# Patient Record
Sex: Female | Born: 1950
Health system: Southern US, Community
[De-identification: ages and names within clinical notes are randomized; demographics above are authoritative.]

## PROBLEM LIST (undated history)

## (undated) DIAGNOSIS — D509 Iron deficiency anemia, unspecified: Secondary | ICD-10-CM

## (undated) DIAGNOSIS — Z87442 Personal history of urinary calculi: Secondary | ICD-10-CM

## (undated) DIAGNOSIS — Z8619 Personal history of other infectious and parasitic diseases: Secondary | ICD-10-CM

## (undated) DIAGNOSIS — G894 Chronic pain syndrome: Secondary | ICD-10-CM

## (undated) DIAGNOSIS — E782 Mixed hyperlipidemia: Secondary | ICD-10-CM

## (undated) DIAGNOSIS — N183 Chronic kidney disease, stage 3 unspecified: Secondary | ICD-10-CM

## (undated) DIAGNOSIS — G40909 Epilepsy, unspecified, not intractable, without status epilepticus: Secondary | ICD-10-CM

## (undated) DIAGNOSIS — I10 Essential (primary) hypertension: Secondary | ICD-10-CM

## (undated) DIAGNOSIS — Z973 Presence of spectacles and contact lenses: Secondary | ICD-10-CM

## (undated) DIAGNOSIS — Z87440 Personal history of urinary (tract) infections: Secondary | ICD-10-CM

## (undated) DIAGNOSIS — Z87448 Personal history of other diseases of urinary system: Secondary | ICD-10-CM

## (undated) DIAGNOSIS — G43709 Chronic migraine without aura, not intractable, without status migrainosus: Secondary | ICD-10-CM

## (undated) DIAGNOSIS — E039 Hypothyroidism, unspecified: Secondary | ICD-10-CM

## (undated) DIAGNOSIS — M1990 Unspecified osteoarthritis, unspecified site: Secondary | ICD-10-CM

## (undated) DIAGNOSIS — N200 Calculus of kidney: Secondary | ICD-10-CM

## (undated) DIAGNOSIS — IMO0002 Reserved for concepts with insufficient information to code with codable children: Secondary | ICD-10-CM

## (undated) DIAGNOSIS — E0590 Thyrotoxicosis, unspecified without thyrotoxic crisis or storm: Secondary | ICD-10-CM

## (undated) DIAGNOSIS — N261 Atrophy of kidney (terminal): Secondary | ICD-10-CM

## (undated) DIAGNOSIS — M542 Cervicalgia: Secondary | ICD-10-CM

## (undated) DIAGNOSIS — E119 Type 2 diabetes mellitus without complications: Secondary | ICD-10-CM

## (undated) DIAGNOSIS — M25511 Pain in right shoulder: Secondary | ICD-10-CM

## (undated) DIAGNOSIS — N201 Calculus of ureter: Secondary | ICD-10-CM

## (undated) HISTORY — DX: Essential (primary) hypertension: I10

## (undated) HISTORY — PX: EYE SURGERY: SHX253

## (undated) HISTORY — PX: SHOULDER ARTHROSCOPY: SHX128

## (undated) HISTORY — PX: TONSILLECTOMY: SUR1361

## (undated) HISTORY — PX: IR URETERAL STENT PLACEMENT EXISTING ACCESS RIGHT: IMG6074

## (undated) HISTORY — PX: OTHER SURGICAL HISTORY: SHX169

## (undated) HISTORY — DX: Iron deficiency anemia, unspecified: D50.9

---

## 2000-10-01 HISTORY — PX: CHOLECYSTECTOMY OPEN: SUR202

## 2000-11-13 ENCOUNTER — Ambulatory Visit (HOSPITAL_BASED_OUTPATIENT_CLINIC_OR_DEPARTMENT_OTHER): Admission: RE | Admit: 2000-11-13 | Discharge: 2000-11-13 | Payer: Self-pay | Admitting: Orthopedic Surgery

## 2001-02-06 ENCOUNTER — Encounter: Payer: Self-pay | Admitting: Urology

## 2001-02-06 ENCOUNTER — Ambulatory Visit (HOSPITAL_COMMUNITY): Admission: RE | Admit: 2001-02-06 | Discharge: 2001-02-06 | Payer: Self-pay | Admitting: Urology

## 2001-03-27 ENCOUNTER — Encounter: Payer: Self-pay | Admitting: Urology

## 2001-03-27 ENCOUNTER — Ambulatory Visit (HOSPITAL_COMMUNITY): Admission: RE | Admit: 2001-03-27 | Discharge: 2001-03-27 | Payer: Self-pay | Admitting: Urology

## 2003-07-27 ENCOUNTER — Other Ambulatory Visit: Admission: RE | Admit: 2003-07-27 | Discharge: 2003-07-27 | Payer: Self-pay | Admitting: Gynecology

## 2004-11-09 ENCOUNTER — Ambulatory Visit (HOSPITAL_COMMUNITY): Admission: RE | Admit: 2004-11-09 | Discharge: 2004-11-09 | Payer: Self-pay | Admitting: Urology

## 2005-02-06 ENCOUNTER — Ambulatory Visit (HOSPITAL_COMMUNITY): Admission: RE | Admit: 2005-02-06 | Discharge: 2005-02-06 | Payer: Self-pay | Admitting: Internal Medicine

## 2005-04-02 ENCOUNTER — Other Ambulatory Visit: Admission: RE | Admit: 2005-04-02 | Discharge: 2005-04-02 | Payer: Self-pay | Admitting: *Deleted

## 2005-04-06 ENCOUNTER — Ambulatory Visit (HOSPITAL_COMMUNITY): Admission: RE | Admit: 2005-04-06 | Discharge: 2005-04-06 | Payer: Self-pay | Admitting: *Deleted

## 2006-04-02 ENCOUNTER — Other Ambulatory Visit: Admission: RE | Admit: 2006-04-02 | Discharge: 2006-04-02 | Payer: Self-pay | Admitting: *Deleted

## 2006-05-02 ENCOUNTER — Ambulatory Visit (HOSPITAL_COMMUNITY): Admission: RE | Admit: 2006-05-02 | Discharge: 2006-05-02 | Payer: Self-pay | Admitting: *Deleted

## 2006-06-11 ENCOUNTER — Ambulatory Visit (HOSPITAL_BASED_OUTPATIENT_CLINIC_OR_DEPARTMENT_OTHER): Admission: RE | Admit: 2006-06-11 | Discharge: 2006-06-11 | Payer: Self-pay | Admitting: Urology

## 2007-08-21 ENCOUNTER — Ambulatory Visit (HOSPITAL_COMMUNITY): Admission: RE | Admit: 2007-08-21 | Discharge: 2007-08-23 | Payer: Self-pay | Admitting: Urology

## 2007-08-25 ENCOUNTER — Ambulatory Visit (HOSPITAL_COMMUNITY): Admission: RE | Admit: 2007-08-25 | Discharge: 2007-08-25 | Payer: Self-pay | Admitting: Urology

## 2011-02-13 NOTE — Op Note (Signed)
Natalie [name], Natalie [name]                 ACCOUNT NO.:  [id number]   MEDICAL RECORD NO.:  [id number]          PATIENT TYPE:  OIB   LOCATION:  1405                         FACILITY:  [facility]   PHYSICIAN:  Mark C. Karsten Ro, M.D.  DATE OF BIRTH:  1951/08/26   DATE OF PROCEDURE:  08/21/2007  DATE OF DISCHARGE:                               OPERATIVE REPORT   PREOPERATIVE DIAGNOSIS:  Right ureteral calculus with obstruction and  fever.   POSTOPERATIVE DIAGNOSIS:  Right ureteral calculus with obstruction and  fever.   PROCEDURE:  1. Cystoscopy.  2. Stent placement.   SURGEON:  Mark C. Karsten Ro, M.D.   ANESTHESIA:  General.   SPECIMENS:  None.   BLOOD LOSS:  None.   DRAINS:  A 5-French 24-cm Polaris stent in the right ureter (no string).   COMPLICATIONS:  None.   INDICATIONS:  The patient is a 60 year old white female who had a known  right lower pole renal calculus.  She began experiencing right flank  pain, had fever to 103 yesterday and was 102 today at her primary care  physician's office.  Her white count was normal, but her urine had too-  numerous-to-count red and white cells.  Concern for obstructed system  with infection and fever necessitated emergent stent placement.  I went  over the procedure, risks and complications with the patient; she  understands and elected to proceed.   DESCRIPTION OF OPERATION:  After informed consent, the patient was  brought to the major OR and placed on the table, administered general  anesthesia and moved to the dorsal lithotomy position.  Genitalia were  sterilely prepped and draped and official time-out was performed.   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  The ureteral orifices were of normal configuration and  position bilaterally.   The right orifice was identified and a 0.038-inch floppy-tip guidewire  was then passed up  the right ureter under direct fluoroscopy.  The  Polaris stent was then passed over the guidewire into the area of the  renal pelvis and as the guidewire was removed, a good curl was noted in  the renal pelvis.  I then removed the cystoscope after draining the  bladder and the patient was awakened and taken to the recovery room in  stable and satisfactory condition.  She tolerated the procedure well.  There were no intraoperative complications.   She will be observed overnight and as long as she remains afebrile,  could possibly be discharged in the morning.      Mark C. Karsten Ro, M.D.  Electronically Signed     MCO/MEDQ  D:  08/21/2007  T:  08/22/2007  Job:  [id number]

## 2011-02-16 NOTE — Op Note (Signed)
Natalie [name], Natalie [name]                 ACCOUNT NO.:  [id number]   MEDICAL RECORD NO.:  ID:[id number]          PATIENT TYPE:  AMB   LOCATION:  [facility]                         FACILITY:  [facility]   PHYSICIAN:  Marshall Cork. Jeffie Pollock, M.D.    DATE OF BIRTH:  Dec 06, 1950   DATE OF PROCEDURE:  06/11/2006  DATE OF DISCHARGE:                                 OPERATIVE REPORT   PROCEDURE:  Cystoscopy with left retrograde pyelogram.   PREOPERATIVE DIAGNOSIS:  Left ureteral stone.   POSTOPERATIVE DIAGNOSIS:  Phlebolith in the left pelvis and bilateral renal  stones.   SURGEON:  Marshall Cork. Jeffie Pollock, M.D.   ANESTHESIA:  General.   SPECIMEN:  None.   COMPLICATIONS:  None.   INDICATIONS:  Ms. Chieffo is a 60 year old white female with a history of  stones who originally presented with some irritative voiding symptoms,  recurrent UTIs, and alternating back pain.  She had a renal ultrasound done  which demonstrated bilateral lower pole renal calculi. A KUB was obtained in  our office which revealed a 3 mm calcification in the left pelvis that was  not present on films from February 2006 and was felt to represent a left  distal ureteral stone.  She did have bilateral renal calculi, as well.  After discussing the treatment options, the patient elected to pursue to  ureteroscopic stone extraction.   FINDINGS AND PROCEDURE:  The patient is given Cipro and B&O suppository. A  general anesthetic was induced.  She was placed in lithotomy position.  Her  perineum and genitalia were prepped with Betadine solution, she was draped  in the usual sterile fashion.  Cystoscopy was performed using the 22-French  scope and 12 and 70 degrees lenses.  Examination revealed a normal urethra.  The bladder wall was smooth with slightly increased vascularity but no  tumor, stones or significant inflammation were noted.  Ureteral orifices  were in their normal anatomic position effluxing clear urine.   The left ureteral orifice was cannulated  with a 5-French catheter and  contrast was instilled.  A retrograde pyelogram demonstrated that the two  calcifications noted on KUB in the area the distal ureter were actually  extraureteral consistent with phleboliths.  The remainder of the collecting  system was unremarkable.   At this point, the patient's bladder was drained, the cystoscope was  removed.  She was taken down from lithotomy position.  Her anesthetic was  reversed and she was moved to the recovery room in stable condition.  There  were no complications.      Marshall Cork. Jeffie Pollock, M.D.  Electronically Signed     JJW/MEDQ  D:  06/11/2006  T:  06/11/2006  Job:  [id number]

## 2011-02-16 NOTE — Op Note (Signed)
[place]  Patient:    Natalie [name], Natalie [name]                        MRN: [id number] Proc. Date: 11/13/00 Adm. Date:  [id number] Attending:  Lara Mulch                           Operative Report  PREOPERATIVE DIAGNOSIS:  Left shoulder subacromial impingement syndrome.  POSTOPERATIVE DIAGNOSIS:  Left shoulder subacromial impingement syndrome.  PROCEDURE:  Left shoulder arthroscopy with glenohumeral debridement and subacromial decompression.  SURGEON:  Estill Bamberg. Ronnie Derby, M.D.  ASSISTANT:  None.  ANESTHESIA:  Axillary with general LMA.  INDICATION FOR PROCEDURE:  Patient has failed conservative measures and has MRI evidence of partial thickness rotator cuff tearing and severe tendinosis and bursitis.  DESCRIPTION OF PROCEDURE:  Patient was laid supine after being administered an interscalene block in the preanesthesia holding area.  We then placed her in the beach chair position with the left upper extremity prepped and draped in usual sterile fashion.  A posterior portal was made with a #11-blade, blunt trocar and cannula and a glenohumeral diagnostic arthroscopy was performed, which showed fraying of the anterior labrum and some undersurface tearing of the rotator cuff.  I then used the switching stick to make an anterior portal and placed a 4.2 Gator shaver into the glenohumeral joint and performed a debridement.  It did appear that it had gone full-thickness.  It was difficult to tell, but once we finished the debridement, we then went into the subacromial space and could not find a full-thickness tear, but we did debride the bursa and used the ArthroCare Wand to complete that bursectomy and also removed the CA ligament off the anterior outer corner of the acromion.  I then used a 6.0 mm cylindrical bur to perform an acromioplasty.  The 30/30 degree test was showed good resection of bone.  I then went back and removed some more bursa and at  that point removed the instrumentation and the fluid.  The patient tolerated the procedure well.  EBL - minimal.  I closed each portal with an interrupted 4-0 nylon suture, dressed with Adaptic, 4 x 4 dressing sponges and a simple sling.  Complications - none. DD:  11/13/00 TD:  11/13/00 Job: [id number]

## 2011-02-16 NOTE — Op Note (Signed)
[place]  Patient:    Natalie [name], Natalie [name]                        MRN: [id number] Proc. Date: 02/25/01 Adm. Date:  [id number] Disc. Date: [id number] Attending:  Hortencia Pilar                           Operative Report  PREOPERATIVE DIAGNOSIS:  Left renal stones with recurrent urinary tract infections.  POSTOPERATIVE DIAGNOSIS:  Left renal stones with recurrent urinary tract infections.  OPERATION PERFORMED:  Cystoscopy, left retrograde pyelogram with interpretation, insertion of left double-J stent.  SURGEON:  Marshall Cork. Jeffie Pollock, M.D.  ANESTHESIA:  General.  DRAINS:   6 French x 24 cm double-J stent.  COMPLICATIONS:  None.  INDICATIONS FOR PROCEDURE:  Ms. [name] is a 60 year old white female who was sent for consultation for recurrent urinary tract infections.  She was found to have a 1.5 cm stone in the left renal pelvis.  It was felt that lithotripsy was indicated due to recurring infections and the size of the stone, I felt stenting was indicated.  DESCRIPTION OF PROCEDURE:  The patient was taken to the operating room where a general anesthetic was induced.  She was placed in lithotomy position.  Her perineum and genitalia were prepped with Betadine solution.  She was draped in the usual sterile fashion.  She had been given antibiotic preoperatively. Cystoscopy was performed using a 21 [place] scope and the 12 and 70 degree lenses.  Examination revealed a normal urethra.  The bladder wall was smooth and pale without tumors, stones or inflammation.  The ureteral orifices were in normal anatomic position effluxing clear urine.  The left ureteral orifice was cannulated  with a 5 [place] open end catheter and contrast instilled in a retrograde fashion. Examination revealed a normal ureter.  There was a filling defect in the renal pelvis consistent with a stone.  It was ovoid in shape and largely filled the pelvis.  No fullness of the collecting  system was noted. No other filling defects were apparent.  After completion of the retrograde, a wire was passed to the kidney and a 6 French 24 cm double-J stent without string was passed to the kidney under fluoroscopic guidance without difficulty.  The wire was removed leaving a good coil in the kidney, good coil in the bladder, the bladder was drained, cystoscope was removed.  The patient was taken down from the lithotomy position.  Her anesthetic was reversed.  She was removed to the recovery room in stable condition.  There were no complications during the procedure. DD:  02/25/01 TD:  02/25/01 Job: [id number]

## 2011-07-10 LAB — CBC
HCT: 28.6 — ABNORMAL LOW
Hemoglobin: 10.1 — ABNORMAL LOW
MCHC: 35.4
MCV: 91.6
Platelets: 205
RBC: 3.12 — ABNORMAL LOW
RDW: 13.3
WBC: 9

## 2011-10-01 ENCOUNTER — Ambulatory Visit (INDEPENDENT_AMBULATORY_CARE_PROVIDER_SITE_OTHER): Payer: 59

## 2011-10-01 DIAGNOSIS — G43109 Migraine with aura, not intractable, without status migrainosus: Secondary | ICD-10-CM

## 2011-10-16 ENCOUNTER — Ambulatory Visit (INDEPENDENT_AMBULATORY_CARE_PROVIDER_SITE_OTHER): Payer: 59

## 2011-10-16 DIAGNOSIS — G43009 Migraine without aura, not intractable, without status migrainosus: Secondary | ICD-10-CM

## 2011-10-16 DIAGNOSIS — G40309 Generalized idiopathic epilepsy and epileptic syndromes, not intractable, without status epilepticus: Secondary | ICD-10-CM

## 2011-11-23 ENCOUNTER — Ambulatory Visit (INDEPENDENT_AMBULATORY_CARE_PROVIDER_SITE_OTHER): Payer: 59 | Admitting: Emergency Medicine

## 2011-11-23 ENCOUNTER — Other Ambulatory Visit: Payer: Self-pay | Admitting: Emergency Medicine

## 2011-11-23 ENCOUNTER — Ambulatory Visit: Payer: 59

## 2011-11-23 VITALS — BP 136/78 | HR 96 | Temp 98.6°F | Resp 18 | Ht <= 58 in | Wt 161.0 lb

## 2011-11-23 DIAGNOSIS — R509 Fever, unspecified: Secondary | ICD-10-CM

## 2011-11-23 DIAGNOSIS — N390 Urinary tract infection, site not specified: Secondary | ICD-10-CM

## 2011-11-23 DIAGNOSIS — D649 Anemia, unspecified: Secondary | ICD-10-CM

## 2011-11-23 DIAGNOSIS — E119 Type 2 diabetes mellitus without complications: Secondary | ICD-10-CM

## 2011-11-23 DIAGNOSIS — N200 Calculus of kidney: Secondary | ICD-10-CM

## 2011-11-23 DIAGNOSIS — R9389 Abnormal findings on diagnostic imaging of other specified body structures: Secondary | ICD-10-CM

## 2011-11-23 LAB — COMPREHENSIVE METABOLIC PANEL
Alkaline Phosphatase: 80 U/L (ref 39–117)
Chloride: 100 mEq/L (ref 96–112)
Glucose, Bld: 237 mg/dL — ABNORMAL HIGH (ref 70–99)
Total Bilirubin: 0.4 mg/dL (ref 0.3–1.2)

## 2011-11-23 LAB — POCT CBC
MCH, POC: 28.4 pg (ref 27–31.2)
MCV: 90.9 fL (ref 80–97)
MPV: 8.8 fL (ref 0–99.8)
POC Granulocyte: 5.2 (ref 2–6.9)
POC LYMPH PERCENT: 30.1 %L (ref 10–50)
Platelet Count, POC: 257 10*3/uL (ref 142–424)
RBC: 3.77 M/uL — AB (ref 4.04–5.48)
RDW, POC: 14.5 %

## 2011-11-23 LAB — POCT URINALYSIS DIPSTICK
Glucose, UA: NEGATIVE
Ketones, UA: NEGATIVE
Leukocytes, UA: NEGATIVE
Spec Grav, UA: 1.015
Urobilinogen, UA: 0.2

## 2011-11-23 LAB — POCT UA - MICROSCOPIC ONLY
Casts, Ur, LPF, POC: NEGATIVE
Crystals, Ur, HPF, POC: NEGATIVE
Yeast, UA: NEGATIVE

## 2011-11-23 MED ORDER — CIPROFLOXACIN HCL 500 MG PO TABS: ORAL_TABLET | ORAL | Status: DC

## 2011-11-23 NOTE — Progress Notes (Signed)
  Subjective:    Patient ID: Natalie [name], female    DOB: 1950/10/24, 61 y.o.   MRN: [id number]  HPI patient enters with not feeling well since middle of January. She initially had a head cold. This gradually resolved. Was diagnosed as flu she's followed this with a 4 to five-day history of vomiting and diarrhea. On Tuesday she had onset of fever to 101102 this was associated with no appetite loose stools and generalized lethargy. She also has had a dry cough.    Review of Systems  Constitutional: Positive for fever, activity change, appetite change and fatigue.  HENT: Negative.   Eyes: Negative.   Respiratory: Positive for cough. Negative for shortness of breath, wheezing and stridor.   Cardiovascular: Negative for chest pain, palpitations and leg swelling.  Gastrointestinal: Positive for nausea, vomiting, abdominal pain and diarrhea.  Genitourinary: Negative for hematuria, flank pain, difficulty urinating and dyspareunia.  Musculoskeletal: Positive for arthralgias.  Skin: Negative.   Hematological: Negative.   Psychiatric/Behavioral: Negative.        Objective:   Physical Exam  Constitutional: She appears well-developed and well-nourished.  Eyes: Pupils are equal, round, and reactive to light.  Neck: No JVD present. No tracheal deviation present. No thyromegaly present.  Cardiovascular: Normal rate, normal heart sounds and intact distal pulses.   Pulmonary/Chest: No respiratory distress. She has no wheezes. She has no rales. She exhibits no tenderness.  Abdominal: She exhibits no distension and no mass. There is tenderness. There is no rebound and no guarding.  Lymphadenopathy:    She has no cervical adenopathy.          Assessment & Plan:     Patient here not feeling well for approximately the last month. We'll check CBC cmet urine chest x-ray to see if we can see where the problem is

## 2011-11-23 NOTE — Patient Instructions (Signed)

## 2011-11-23 NOTE — Progress Notes (Signed)
  Subjective:    Patient ID: Natalie [name], female    DOB: Sep 16, 1951, 61 y.o.   MRN: [id number]  HPI    Review of Systems     Objective:   Physical Exam  [facility] reading (PRIMARY) by  Dr. Everlene Farrier chest x-ray was over read by the radiologist and revealed a prominence of the right paratracheal stripe. When compared to previous films this appears to be a new finding. We'll proceed with CT of the chest to rule out a mass.       Assessment & Plan:

## 2011-11-23 NOTE — Progress Notes (Signed)
  Subjective:    Patient ID: Natalie [name], female    DOB: 01-08-1951, 61 y.o.   MRN: [id number]  HPI    Review of Systems     Objective:   Physical Exam   [facility] reading (PRIMARY) by  Dr Everlene Farrier NAD      Assessment & Plan:

## 2011-11-26 LAB — URINE CULTURE: Colony Count: >=100000

## 2011-11-27 ENCOUNTER — Ambulatory Visit (INDEPENDENT_AMBULATORY_CARE_PROVIDER_SITE_OTHER): Payer: 59 | Admitting: Internal Medicine

## 2011-11-27 VITALS — BP 131/78 | HR 77 | Temp 97.6°F | Resp 16 | Ht <= 58 in | Wt 161.0 lb

## 2011-11-27 DIAGNOSIS — N390 Urinary tract infection, site not specified: Secondary | ICD-10-CM

## 2011-11-27 LAB — POCT UA - MICROSCOPIC ONLY
Casts, Ur, LPF, POC: NEGATIVE
Mucus, UA: NEGATIVE

## 2011-11-27 LAB — POCT URINALYSIS DIPSTICK
Glucose, UA: NEGATIVE
Ketones, UA: NEGATIVE
Nitrite, UA: NEGATIVE
Urobilinogen, UA: 0.2
pH, UA: 5.5

## 2011-11-27 MED ORDER — BUTORPHANOL TARTRATE 10 MG/ML NA SOLN: 1.0000 | NASAL | Status: DC | PRN

## 2011-11-27 MED ORDER — LEVOFLOXACIN 500 MG PO TABS: 500.0000 mg | ORAL_TABLET | Freq: Every day | ORAL | Status: AC

## 2011-11-27 MED ORDER — LEVOTHYROXINE SODIUM 75 MCG PO TABS: 75.0000 ug | ORAL_TABLET | Freq: Every day | ORAL | Status: DC

## 2011-11-27 NOTE — Progress Notes (Signed)
  Subjective:    Patient ID: Natalie [name], female    DOB: 11-Nov-1950, 61 y.o.   MRN: [id number]  HPI Followup for urinary tract infection on day 5 of Cipro with Escherichia coli sensitive to all. Feels 63% better though still has urinary frequency with some urgency. No further fever nausea vomiting. See the long history of recurring urinary tract infection with pyelonephritis.  Also needs refill of Synthroid and Stadol   Review of Systems     Objective:   Physical Exam Vital signs  stable  No CVA tenderness     Assessment & Plan:  Problem #1 UTI partially resolved Levaquin 500 daily x10 days with repeat culture 2 weeks after treatment or sooner if worse  Problem #2 migraine headache Stadol refills  Problem #3 hypothyroidism Synthroid refilled/next TSH November 2013  Problem #4 diabetes with hyperglycemia at last office visit thought secondary to urinary tract infection We'll repeat hemoglobin A1c as planned in May

## 2011-12-07 ENCOUNTER — Telehealth: Payer: Self-pay

## 2011-12-07 MED ORDER — BUTORPHANOL TARTRATE 10 MG/ML NA SOLN: 1.0000 | NASAL | Status: DC | PRN

## 2011-12-07 NOTE — Telephone Encounter (Signed)
PT NEEDS REFILL ON HER STADOL

## 2011-12-07 NOTE — Telephone Encounter (Signed)
Signed at [facility].

## 2011-12-08 NOTE — Telephone Encounter (Signed)
Left message on machine that rx is up front for p/u

## 2011-12-10 ENCOUNTER — Other Ambulatory Visit: Payer: Self-pay | Admitting: Internal Medicine

## 2011-12-10 DIAGNOSIS — G43909 Migraine, unspecified, not intractable, without status migrainosus: Secondary | ICD-10-CM

## 2011-12-10 MED ORDER — BUTORPHANOL TARTRATE 10 MG/ML NA SOLN: 1.0000 | NASAL | Status: AC | PRN

## 2011-12-25 ENCOUNTER — Telehealth: Payer: Self-pay

## 2011-12-25 NOTE — Telephone Encounter (Signed)
PT IN NEED OF HER STADOL. [place] TO WRITE IT PLEASE CALL [phone]

## 2011-12-26 MED ORDER — BUTORPHANOL TARTRATE 10 MG/ML NA SOLN: 1.0000 | NASAL | Status: DC | PRN

## 2011-12-26 NOTE — Telephone Encounter (Signed)
printed

## 2011-12-27 NOTE — Telephone Encounter (Signed)
Notified pt that Rx is ready for p/up

## 2012-01-07 ENCOUNTER — Telehealth: Payer: Self-pay

## 2012-01-07 MED ORDER — BUTORPHANOL TARTRATE 10 MG/ML NA SOLN: 1.0000 | NASAL | Status: DC | PRN

## 2012-01-07 NOTE — Telephone Encounter (Signed)
Addended by: Mancel Bale on: 01/07/2012 01:38 PM   Modules accepted: Orders

## 2012-01-07 NOTE — Telephone Encounter (Signed)
Patient notified and rx in pickup drawer.

## 2012-01-07 NOTE — Telephone Encounter (Signed)
Pt requesting rx refill on stadol please call when ready for pick-up

## 2012-01-07 NOTE — Telephone Encounter (Signed)
Needs ov before more refills are given.

## 2012-01-21 ENCOUNTER — Telehealth: Payer: Self-pay

## 2012-01-21 NOTE — Telephone Encounter (Signed)
Patient request refill of Stadol. States she will have to come pick it up.

## 2012-01-21 NOTE — Telephone Encounter (Signed)
Should have had 1 refill on rx from 4/8... Correct?  Patient needs office visit.

## 2012-01-21 NOTE — Telephone Encounter (Signed)
Yes needs recheck but will forward to Dr. Laney Pastor who may make a different decision

## 2012-01-22 ENCOUNTER — Other Ambulatory Visit: Payer: Self-pay | Admitting: Physician Assistant

## 2012-01-22 MED ORDER — BUTORPHANOL TARTRATE 10 MG/ML NA SOLN: 1.0000 | NASAL | Status: DC | PRN

## 2012-01-22 NOTE — Telephone Encounter (Signed)
FAXED REFILL TO [facility]

## 2012-01-22 NOTE — Telephone Encounter (Signed)
jpt CB and stated the Rx is one she normally has to p/up. Told pt I would leave Rx at front desk for p/up

## 2012-01-22 NOTE — Telephone Encounter (Signed)
Okay for Stadol refill

## 2012-01-22 NOTE — Telephone Encounter (Signed)
LMOM to call back

## 2012-01-30 ENCOUNTER — Other Ambulatory Visit: Payer: Self-pay | Admitting: Internal Medicine

## 2012-01-30 MED ORDER — METFORMIN HCL 850 MG PO TABS: 850.0000 mg | ORAL_TABLET | Freq: Every day | ORAL | Status: DC

## 2012-01-30 MED ORDER — BUTORPHANOL TARTRATE 10 MG/ML NA SOLN: 1.0000 | NASAL | Status: AC | PRN

## 2012-02-18 ENCOUNTER — Telehealth: Payer: Self-pay

## 2012-02-18 MED ORDER — BUTORPHANOL TARTRATE 10 MG/ML NA SOLN: 1.0000 | NASAL | Status: DC | PRN

## 2012-02-18 NOTE — Telephone Encounter (Signed)
PT REQUESTING STADOL REFILL    BEST PHONE [phone]

## 2012-02-18 NOTE — Telephone Encounter (Signed)
Stadol 5/1 and 1 refill so not due til 5/28 or after Will leave rx for pickup now for filling 5/28

## 2012-02-19 ENCOUNTER — Ambulatory Visit (INDEPENDENT_AMBULATORY_CARE_PROVIDER_SITE_OTHER): Payer: 59 | Admitting: Internal Medicine

## 2012-02-19 VITALS — BP 134/75 | HR 82 | Temp 98.8°F | Resp 18 | Ht <= 58 in | Wt 159.2 lb

## 2012-02-19 DIAGNOSIS — G43909 Migraine, unspecified, not intractable, without status migrainosus: Secondary | ICD-10-CM

## 2012-02-19 DIAGNOSIS — G4700 Insomnia, unspecified: Secondary | ICD-10-CM

## 2012-02-19 DIAGNOSIS — E039 Hypothyroidism, unspecified: Secondary | ICD-10-CM

## 2012-02-19 MED ORDER — LEVOTHYROXINE SODIUM 75 MCG PO TABS: 75.0000 ug | ORAL_TABLET | Freq: Every day | ORAL | Status: DC

## 2012-02-19 MED ORDER — ZOLPIDEM TARTRATE 10 MG PO TABS: 10.0000 mg | ORAL_TABLET | Freq: Every evening | ORAL | Status: DC | PRN

## 2012-02-19 MED ORDER — BUTORPHANOL TARTRATE 10 MG/ML NA SOLN: 1.0000 | NASAL | Status: AC | PRN

## 2012-02-19 NOTE — Progress Notes (Signed)
  Subjective:    Patient ID: Natalie [name], female    DOB: 03-17-1951, 61 y.o.   MRN: [id number]  HPIJust here for medication refills for her hypothyroidism, migraine headache, and insomnia Everything is working well She is scheduled for a full physical with labs in one month    Review of Systems     Objective:   Physical Exam Vital signs stable No thyromegaly Neurological intact       Assessment & Plan:   1. Hypothyroid  levothyroxine (SYNTHROID, LEVOTHROID) 75 MCG tablet, DISCONTINUED: levothyroxine (SYNTHROID, LEVOTHROID) 75 MCG tablet  2. Migraine    3. Insomnia  zolpidem (AMBIEN) 10 MG tablet, DISCONTINUED: zolpidem (AMBIEN) 10 MG tablet   Meds ordered this encounter  Medications  . butorphanol (STADOL) 10 MG/ML nasal spray    Sig: Place 1 spray into the nose every 4 (four) hours as needed for pain. May refill weekly for 3 more times    Dispense:  2.5 mL    Refill:  3  .               .               . levothyroxine (SYNTHROID, LEVOTHROID) 75 MCG tablet    Sig: Take 1 tablet (75 mcg total) by mouth daily.    Dispense:  30 tablet    Refill:  11  . zolpidem (AMBIEN) 10 MG tablet    Sig: Take 1 tablet (10 mg total) by mouth at bedtime as needed.    Dispense:  30 tablet    Refill:  5   Followup one month

## 2012-02-19 NOTE — Telephone Encounter (Signed)
patient notified and voiced understanding. Will come and pick up RX

## 2012-03-04 ENCOUNTER — Ambulatory Visit (INDEPENDENT_AMBULATORY_CARE_PROVIDER_SITE_OTHER): Payer: 59 | Admitting: Internal Medicine

## 2012-03-04 ENCOUNTER — Other Ambulatory Visit: Payer: Self-pay | Admitting: Physician Assistant

## 2012-03-04 VITALS — BP 144/81 | HR 69 | Temp 98.2°F | Resp 16 | Ht <= 58 in | Wt 157.0 lb

## 2012-03-04 DIAGNOSIS — G43009 Migraine without aura, not intractable, without status migrainosus: Secondary | ICD-10-CM

## 2012-03-04 DIAGNOSIS — N390 Urinary tract infection, site not specified: Secondary | ICD-10-CM

## 2012-03-04 DIAGNOSIS — E119 Type 2 diabetes mellitus without complications: Secondary | ICD-10-CM

## 2012-03-04 DIAGNOSIS — R300 Dysuria: Secondary | ICD-10-CM

## 2012-03-04 LAB — POCT URINALYSIS DIPSTICK
Bilirubin, UA: NEGATIVE
Spec Grav, UA: 1.02
Urobilinogen, UA: 0.2

## 2012-03-04 LAB — POCT UA - MICROSCOPIC ONLY
Casts, Ur, LPF, POC: NEGATIVE
Crystals, Ur, HPF, POC: NEGATIVE
Yeast, UA: NEGATIVE

## 2012-03-04 MED ORDER — TRAMADOL HCL 50 MG PO TABS: ORAL_TABLET | ORAL | Status: DC

## 2012-03-04 MED ORDER — METFORMIN HCL 850 MG PO TABS: 850.0000 mg | ORAL_TABLET | Freq: Every day | ORAL | Status: DC

## 2012-03-04 MED ORDER — LEVOFLOXACIN 500 MG PO TABS: 500.0000 mg | ORAL_TABLET | Freq: Every day | ORAL | Status: AC

## 2012-03-04 NOTE — Progress Notes (Signed)
  Subjective:    Patient ID: Natalie [name], female    DOB: 01/14/1951, 61 y.o.   MRN: [id number]  HPIStarted with dysuria frequency and fever to 103 5 days ago-Started leftover Cipro at home Headache started with a fever which is more left-sided but is not accompanied by nausea vomiting or vision changes/it has not responded to her usual doses of Stadol/Her usual headache is on the right side/this is interfering with her daily activity  She has no abdominal pain/continues to have frequency despite the antibiotics/Note history of recurrent urinary tract infection with pyelonephritis  Also had problems with [employer], not mailing metformin ordered on 01/30/2012    Review of Systems  Constitutional: Negative for chills and diaphoresis.  Gastrointestinal: Negative for nausea, vomiting, abdominal pain and diarrhea.  Genitourinary: Negative for decreased urine volume, vaginal discharge and pelvic pain.  Musculoskeletal: Negative for back pain.  Neurological: Negative for tremors, speech difficulty, weakness and light-headedness.  Psychiatric/Behavioral: Negative for sleep disturbance and agitation.       Objective:   Physical Exam In mild distress with photophobia Pupils equal round reactive to light and accommodation Negative CVA tenderness to percussion Abdomen benign Neurological intact       Results for orders placed in visit on 03/04/12  POCT UA - MICROSCOPIC ONLY      Component Value Range   WBC, Ur, HPF, POC 25-30     RBC, urine, microscopic 8-12     Bacteria, U Microscopic 1+     Mucus, UA trace     Epithelial cells, urine per micros 2-5     Crystals, Ur, HPF, POC neg     Casts, Ur, LPF, POC neg     Yeast, UA neg    POCT URINALYSIS DIPSTICK      Component Value Range   Color, UA yellow     Clarity, UA cloudy     Glucose, UA neg     Bilirubin, UA neg     Ketones, UA neg     Spec Grav, UA 1.020     Blood, UA small     pH, UA 5.5     Protein, UA 100     Urobilinogen,  UA 0.2     Nitrite, UA neg     Leukocytes, UA small (1+)      Assessment & Plan:  Problem #1 recurrent urinary tract infections Culture Change to Levaquin 500 daily for 10 days  Problem #2 headache syndrome Tramadol 50to100 4 times a day as needed  Problem #3 diabetes Recent meds for her prescription a 50 #90 one daily with one refill to [facility]

## 2012-03-19 ENCOUNTER — Ambulatory Visit (INDEPENDENT_AMBULATORY_CARE_PROVIDER_SITE_OTHER): Payer: 59 | Admitting: Internal Medicine

## 2012-03-19 ENCOUNTER — Encounter: Payer: Self-pay | Admitting: Internal Medicine

## 2012-03-19 VITALS — BP 156/81 | HR 72 | Temp 97.2°F | Resp 16 | Ht <= 58 in | Wt 157.0 lb

## 2012-03-19 DIAGNOSIS — E785 Hyperlipidemia, unspecified: Secondary | ICD-10-CM | POA: Insufficient documentation

## 2012-03-19 DIAGNOSIS — G43909 Migraine, unspecified, not intractable, without status migrainosus: Secondary | ICD-10-CM

## 2012-03-19 DIAGNOSIS — E119 Type 2 diabetes mellitus without complications: Secondary | ICD-10-CM | POA: Insufficient documentation

## 2012-03-19 DIAGNOSIS — I10 Essential (primary) hypertension: Secondary | ICD-10-CM

## 2012-03-19 DIAGNOSIS — G40909 Epilepsy, unspecified, not intractable, without status epilepticus: Secondary | ICD-10-CM | POA: Insufficient documentation

## 2012-03-19 DIAGNOSIS — E039 Hypothyroidism, unspecified: Secondary | ICD-10-CM | POA: Insufficient documentation

## 2012-03-19 DIAGNOSIS — G4700 Insomnia, unspecified: Secondary | ICD-10-CM

## 2012-03-19 DIAGNOSIS — Z0000 Encounter for general adult medical examination without abnormal findings: Secondary | ICD-10-CM

## 2012-03-19 LAB — CBC WITH DIFFERENTIAL/PLATELET
Basophils Absolute: 0 10*3/uL (ref 0.0–0.1)
Basophils Relative: 0 % (ref 0–1)
Eosinophils Absolute: 0.1 10*3/uL (ref 0.0–0.7)
Eosinophils Relative: 2 % (ref 0–5)
MCH: 30.4 pg (ref 26.0–34.0)
MCHC: 32.9 g/dL (ref 30.0–36.0)
MCV: 92.6 fL (ref 78.0–100.0)
Neutrophils Relative %: 60 % (ref 43–77)
Platelets: 326 10*3/uL (ref 150–400)
RBC: 4.04 MIL/uL (ref 3.87–5.11)
RDW: 15 % (ref 11.5–15.5)

## 2012-03-19 LAB — COMPREHENSIVE METABOLIC PANEL
ALT: 9 U/L (ref 0–35)
Alkaline Phosphatase: 75 U/L (ref 39–117)
CO2: 25 mEq/L (ref 19–32)
Creat: 0.78 mg/dL (ref 0.50–1.10)
Sodium: 140 mEq/L (ref 135–145)
Total Bilirubin: 0.3 mg/dL (ref 0.3–1.2)
Total Protein: 6.7 g/dL (ref 6.0–8.3)

## 2012-03-19 LAB — LIPID PANEL
Cholesterol: 182 mg/dL (ref 0–200)
LDL Cholesterol: 105 mg/dL — ABNORMAL HIGH (ref 0–99)
Total CHOL/HDL Ratio: 3.2 Ratio
VLDL: 20 mg/dL (ref 0–40)

## 2012-03-19 LAB — TSH: TSH: 1.322 u[IU]/mL (ref 0.350–4.500)

## 2012-03-19 MED ORDER — ONDANSETRON 8 MG PO TBDP: 8.0000 mg | ORAL_TABLET | Freq: Three times a day (TID) | ORAL | Status: DC | PRN

## 2012-03-19 MED ORDER — PHENOBARBITAL 100 MG PO TABS: 100.0000 mg | ORAL_TABLET | ORAL | Status: DC

## 2012-03-19 MED ORDER — ZOLPIDEM TARTRATE 10 MG PO TABS: 10.0000 mg | ORAL_TABLET | Freq: Every evening | ORAL | Status: DC | PRN

## 2012-03-19 MED ORDER — BUTORPHANOL TARTRATE 10 MG/ML NA SOLN: 1.0000 | NASAL | Status: DC | PRN

## 2012-03-19 MED ORDER — METFORMIN HCL 850 MG PO TABS: 850.0000 mg | ORAL_TABLET | Freq: Every day | ORAL | Status: DC

## 2012-03-19 MED ORDER — METOPROLOL SUCCINATE ER 100 MG PO TB24: 100.0000 mg | ORAL_TABLET | Freq: Every day | ORAL | Status: DC

## 2012-03-19 MED ORDER — LISINOPRIL 20 MG PO TABS: 20.0000 mg | ORAL_TABLET | Freq: Every day | ORAL | Status: DC

## 2012-03-19 NOTE — Progress Notes (Signed)
Subjective:    Patient ID: Natalie [name], female    DOB: Jul 06, 1951, 61 y.o.   MRN: [id number]  HPI 1. DM (diabetes mellitus) -Is asymptomatic and recent hemoglobin A1c's have been good-C. #8 POCT glycosylated hemoglobin (Hb A1C), metFORMIN (GLUCOPHAGE) 850 MG tablet  2. HTN (hypertension)  CBC with Differential, Comprehensive metabolic panel, Hemoccult - Multi Cards (take home), lisinopril (PRINIVIL,ZESTRIL) 20 MG tablet  3. Hyperlipemia No side effects with Lipitor-Now has complications with insurance and changing to [employer] so will need to be on lovastatin or Pravachol Lipid panel  4. Hypothyroidism -Asymptomatic if this does TSH  5. Seizure disorder -Stable at this does for many years She has been advised at times to discontinue her phenobarbital thinking that her seizure disorder has resolved however every time she does if she has another seizure so about 7 years ago which we decided to keep her on this medicine forever PHENObarbital (LUMINAL) 100 MG tablet  6. Insomnia -Still can't sleep without Ambien but responds well without side effects the next day zolpidem (AMBIEN) 10 MG tablet  7. Migraine -Continues to have his serious headaches with photophobia and nausea and vomiting that are exacerbated his weather front move into our area butorphanol (STADOL) 10 MG/ML nasal spray, ondansetron (ZOFRAN-ODT) 8 MG disintegrating tablet, metoprolol succinate (TOPROL-XL) 100 MG 24 hr tablet  8.  Recurrent UTIs with pyelonephritis-Responded to recent treatment with Levaquin/urology is involved in her followup for this and has been for years/I see no clear etiology other than she has some residual stones 9.  Margarita Mail has lost 15 pounds over the last 6 months and says she really has not tryied   Family history-36 year old son marrying a 49 year old woman this weekend He continues to have a lot of the care for her adolescent granddaughter and everything is going well   Review of Systems    Constitutional: Positive for fever. Negative for activity change, appetite change, fatigue and unexpected weight change.  HENT: Negative for hearing loss, mouth sores, trouble swallowing, neck pain and dental problem.   Eyes: Negative for photophobia and visual disturbance.       Othalmo 10/2011 wnl  Respiratory: Negative for apnea and shortness of breath.   Cardiovascular: Negative for chest pain, palpitations and leg swelling.  Gastrointestinal: Negative for nausea, vomiting, abdominal pain and diarrhea.  Genitourinary: Negative for pelvic pain.       Recent Uti again-responded /now asympto  Musculoskeletal: Negative for myalgias, back pain, joint swelling, arthralgias and gait problem.  Skin: Negative for rash.  Neurological: Negative for dizziness, tremors, syncope and weakness.  Hematological: Negative for adenopathy. Does not bruise/bleed easily.  Psychiatric/Behavioral: Positive for disturbed wake/sleep cycle. Negative for suicidal ideas, hallucinations, behavioral problems, confusion, self-injury, dysphoric mood, decreased concentration and agitation. The patient is not nervous/anxious.        Objective:   Physical Exam  Constitutional: She is oriented to person, place, and time. She appears well-developed and well-nourished.  HENT:  Head: Normocephalic.  Right Ear: External ear normal.  Left Ear: External ear normal.  Nose: Nose normal.  Mouth/Throat: Oropharynx is clear and moist.  Eyes: Conjunctivae and EOM are normal. Pupils are equal, round, and reactive to light.  Neck: Normal range of motion. Neck supple.  Cardiovascular: Normal rate, regular rhythm, normal heart sounds and intact distal pulses.   No murmur heard. Pulmonary/Chest: Effort normal and breath sounds normal.  Abdominal: Bowel sounds are normal. She exhibits no mass.  Musculoskeletal: Normal range of motion. She exhibits no  edema.  Neurological: She is alert and oriented to person, place, and time. She has  normal reflexes.  Skin: No rash noted.  Psychiatric: She has a normal mood and affect. Her behavior is normal. Judgment and thought content normal.                   Lab Results  Component Value Date   HGBA1C 7.8 03/19/2012                   Assessment & Plan:   1. DM (diabetes mellitus)  POCT glycosylated hemoglobin (Hb A1C), metFORMIN (GLUCOPHAGE) 850 MG tablet  2. HTN (hypertension)  CBC with Differential, Comprehensive metabolic panel, Hemoccult - Multi Cards (take home), lisinopril (PRINIVIL,ZESTRIL) 20 MG tablet  3. Hyperlipemia  Lipid panel  4. Hypothyroidism  TSH  5. Seizure disorder  PHENObarbital (LUMINAL) 100 MG tablet  6. Insomnia  zolpidem (AMBIEN) 10 MG tablet  7. Migraine  butorphanol (STADOL) 10 MG/ML nasal spray, ondansetron (ZOFRAN-ODT) 8 MG disintegrating tablet, metoprolol succinate (TOPROL-XL) 100 MG 24 hr tablet   zostavax written Await other lab results re chg in rx Continue to follow wt chg Look for new ways to augment HA prevention/rx Meds ordered this encounter  Medications  .       .       . zolpidem (AMBIEN) 10 MG tablet    Sig: Take 1 tablet (10 mg total) by mouth at bedtime as needed.    Dispense:  30 tablet    Refill:  5  . lisinopril (PRINIVIL,ZESTRIL) 20 MG tablet    Sig: Take 1 tablet (20 mg total) by mouth daily.    Dispense:  90 tablet    Refill:  1  . butorphanol (STADOL) 10 MG/ML nasal spray    Sig: Place 1 spray into the nose every 4 (four) hours as needed for pain.    Dispense:  2.5 mL    Refill:  3  . PHENObarbital (LUMINAL) 100 MG tablet    Sig: Take 1 tablet (100 mg total) by mouth 1 day or 1 dose. 2 times daily on Monday Wednesday and Friday    Dispense:  120 tablet    Refill:  1  . ondansetron (ZOFRAN-ODT) 8 MG disintegrating tablet    Sig: Take 1 tablet (8 mg total) by mouth every 8 (eight) hours as needed.    Dispense:  20 tablet    Refill:  5  . metFORMIN (GLUCOPHAGE) 850 MG tablet    Sig: Take 1 tablet (850  mg total) by mouth daily with breakfast.    Dispense:  90 tablet    Refill:  1  . metoprolol succinate (TOPROL-XL) 100 MG 24 hr tablet    Sig: Take 1 tablet (100 mg total) by mouth daily. Take with or immediately following a meal.    Dispense:  90 tablet    Refill:  1

## 2012-03-20 ENCOUNTER — Telehealth: Payer: Self-pay

## 2012-03-20 ENCOUNTER — Encounter: Payer: Self-pay | Admitting: Internal Medicine

## 2012-03-20 LAB — POC HEMOCCULT BLD/STL (HOME/3-CARD/SCREEN)

## 2012-03-20 MED ORDER — METOPROLOL TARTRATE 100 MG PO TABS: 100.0000 mg | ORAL_TABLET | Freq: Two times a day (BID) | ORAL | Status: DC

## 2012-03-20 MED ORDER — PRAVASTATIN SODIUM 40 MG PO TABS: 40.0000 mg | ORAL_TABLET | Freq: Every day | ORAL | Status: DC

## 2012-03-20 NOTE — Telephone Encounter (Signed)
REC'D RX YESTERDAY FROM DR DOOLITTLE, BUT PT STATES THAT 2 WERE INCORRECT. SHE NEEDS PRAVASTAIN 40MG   90 SUPPLY AND METOPROLOL TARTRATE 100 TAB  (NOT TIME RELEASED) 90 SUPPLY [facility] ON [address] PLEASE CALL WHEN TAKEN CARE OF.   THANKS

## 2012-03-20 NOTE — Addendum Note (Signed)
Addended by: Constance Goltz on: 03/20/2012 01:51 PM   Modules accepted: Orders

## 2012-03-20 NOTE — Telephone Encounter (Signed)
Meds corrected and patient notified.

## 2012-03-25 ENCOUNTER — Telehealth: Payer: Self-pay

## 2012-03-25 DIAGNOSIS — E039 Hypothyroidism, unspecified: Secondary | ICD-10-CM

## 2012-03-25 MED ORDER — LEVOTHYROXINE SODIUM 75 MCG PO TABS: 75.0000 ug | ORAL_TABLET | Freq: Every day | ORAL | Status: DC

## 2012-03-25 NOTE — Telephone Encounter (Signed)
Pt states her levothyroxine (SYNTHROID, LEVOTHROID) 75 MCG tablet Is missing from her refills - [facility] [address] 90 day supply

## 2012-04-09 ENCOUNTER — Telehealth: Payer: Self-pay

## 2012-04-09 DIAGNOSIS — G43909 Migraine, unspecified, not intractable, without status migrainosus: Secondary | ICD-10-CM

## 2012-04-09 NOTE — Telephone Encounter (Signed)
It looks like on 6/19 she received #1 with 3 refills.

## 2012-04-09 NOTE — Telephone Encounter (Signed)
NEEDS REFILL ON STADOL. WOULD LIKE TO GET BEFORE SHE GOES OUT OF TOWN.  [facility] [address]

## 2012-04-09 NOTE — Telephone Encounter (Signed)
Pt wants to know if we can authorize an early refill because she is leaving to go out of town next week and doesn't know when she'll be back

## 2012-04-09 NOTE — Telephone Encounter (Signed)
I recommend she call when she has filled her last of the 3 refills, and if she is still out of town, we can send to pharmacy where she is located.

## 2012-04-10 MED ORDER — BUTORPHANOL TARTRATE 10 MG/ML NA SOLN: 1.0000 | NASAL | Status: DC | PRN

## 2012-04-10 NOTE — Telephone Encounter (Signed)
Called pt and advised her that Dr Laney Pastor did print out a Rx for her Stadol RF for next week and that he can sign it in the morning. Pt stated that she will probably come in around 9:00 and if not ready she will just have to get it next week. Printed  Rx is in Dr [employer].

## 2012-04-10 NOTE — Telephone Encounter (Signed)
Last rx 03/19/12 Ok for refill 7//17/13 Printed today, and i can sign in the am

## 2012-04-10 NOTE — Telephone Encounter (Signed)
Pt states that she will p/up her last RF of Stadol tomorrow because they are RFd every week. Pt requests a written copy for another RF for next Friday that states she can fill on that date so she can either take it to pharm OOT or to reg pharm if she is back. Can you do this Dr Laney Pastor? She is leaving tomorrow morning, but could come get it then if she can not get today.

## 2012-04-11 NOTE — Telephone Encounter (Signed)
I won't be in til 11am..Marland Kitchen

## 2012-04-11 NOTE — Telephone Encounter (Signed)
Pt came in to 102 and p/up signed Rx for her Stadol

## 2012-04-21 ENCOUNTER — Other Ambulatory Visit: Payer: Self-pay | Admitting: Internal Medicine

## 2012-05-09 ENCOUNTER — Telehealth: Payer: Self-pay

## 2012-05-09 DIAGNOSIS — G43909 Migraine, unspecified, not intractable, without status migrainosus: Secondary | ICD-10-CM

## 2012-05-09 NOTE — Telephone Encounter (Signed)
Natalie [name] would like refill on Stadol.  Please call when ready to pick up.  [phone].

## 2012-05-10 MED ORDER — BUTORPHANOL TARTRATE 10 MG/ML NA SOLN: 1.0000 | NASAL | Status: DC | PRN

## 2012-05-10 NOTE — Telephone Encounter (Signed)
Looks like he got this on 7/11 with 3 refills and so should have refills on file at pharmacy

## 2012-05-10 NOTE — Telephone Encounter (Signed)
Spoke with patient and notified below, patient states she filled last refill on Friday.  To PA to rx?

## 2012-05-10 NOTE — Telephone Encounter (Signed)
First rx in error. Patient received #1 bottle no refills.

## 2012-05-10 NOTE — Telephone Encounter (Signed)
Rx in pick up drawer, patient notified.

## 2012-05-21 ENCOUNTER — Telehealth: Payer: Self-pay

## 2012-05-21 DIAGNOSIS — G43909 Migraine, unspecified, not intractable, without status migrainosus: Secondary | ICD-10-CM

## 2012-05-21 MED ORDER — BUTORPHANOL TARTRATE 10 MG/ML NA SOLN: 1.0000 | NASAL | Status: DC | PRN

## 2012-05-21 NOTE — Telephone Encounter (Signed)
DR DOOLITTLE  PT WOULD LIKE REFILL ON STADOL (W/REFILLS, TOO). WILL PICK UP WHEN READY.  [phone]

## 2012-05-21 NOTE — Telephone Encounter (Signed)
I asked him that Heather's prescription last week could not  be refill Meds ordered this encounter  Medications  . butorphanol (STADOL) 10 MG/ML nasal spray    Sig: Place 1 spray into the nose every 4 (four) hours as needed for pain.    Dispense:  2.5 mL    Refill:  3

## 2012-05-21 NOTE — Telephone Encounter (Signed)
Dr Laney Pastor, sorry to send you this, but you have to write for the refills.

## 2012-05-23 ENCOUNTER — Ambulatory Visit (INDEPENDENT_AMBULATORY_CARE_PROVIDER_SITE_OTHER): Payer: 59 | Admitting: Internal Medicine

## 2012-05-23 VITALS — BP 162/84 | HR 72 | Temp 98.9°F | Resp 14 | Ht <= 58 in | Wt 161.0 lb

## 2012-05-23 DIAGNOSIS — J40 Bronchitis, not specified as acute or chronic: Secondary | ICD-10-CM

## 2012-05-23 DIAGNOSIS — G43909 Migraine, unspecified, not intractable, without status migrainosus: Secondary | ICD-10-CM

## 2012-05-23 MED ORDER — HYDROCOD POLST-CHLORPHEN POLST 10-8 MG/5ML PO LQCR: 5.0000 mL | Freq: Two times a day (BID) | ORAL | Status: DC | PRN

## 2012-05-23 MED ORDER — BUTORPHANOL TARTRATE 10 MG/ML NA SOLN: 1.0000 | NASAL | Status: DC | PRN

## 2012-05-23 NOTE — Progress Notes (Signed)
  Subjective:    Patient ID: Natalie [name], female    DOB: 08/29/1951, 61 y.o.   MRN: [id number]  HPINeeds refill of Stadol for migraines Continues to have severe migraines with weather front changes Stadol allows her to function pretty normally in her life style  Hypertension-continues with meds/no cardiac symptoms/home blood pressures are normal as opposed to  today  Cough for the past 2 weeks/productive at times/no wheezing/no fever/cough interferes with sleep/cough interferes with husband sleep/no sinus congestion or sore throat  Review of Systems No fever chills or night sweats Diabetes has been stable at home     Objective:   Physical Exam Filed Vitals:   05/23/12 0810  BP: 162/84  Pulse: 72  Temp: 98.9 F (37.2 C)  Resp: 14   And TMs clear/conjunctiva not injected/nares boggy with scant mucus/septal defect continues Throat clear No nodes Chest clear to auscultation/no forced expiratory wheezes Heart regular without murmur rate 74 No edema        Assessment & Plan:  Problem #1 migraine syndrome Refill stadol for one month  Problem #2 prolonged cough secondary to viral bronchitis antituss/ follow up with fever   Meds ordered this encounter  Medications  . butorphanol (STADOL) 10 MG/ML nasal spray    Sig: Place 1 spray into the nose every 4 (four) hours as needed for pain.    Dispense:  2.5 mL    Refill:  3  . chlorpheniramine-HYDROcodone (TUSSIONEX PENNKINETIC ER) 10-8 MG/5ML LQCR    Sig: Take 5 mLs by mouth every 12 (twelve) hours as needed.    Dispense:  140 mL    Refill:  0

## 2012-06-16 ENCOUNTER — Telehealth: Payer: Self-pay

## 2012-06-16 DIAGNOSIS — G43909 Migraine, unspecified, not intractable, without status migrainosus: Secondary | ICD-10-CM

## 2012-06-16 NOTE — Telephone Encounter (Signed)
Pt requesting rx refill for stadol   [facility] phone [phone]

## 2012-06-17 MED ORDER — BUTORPHANOL TARTRATE 10 MG/ML NA SOLN: 1.0000 | NASAL | Status: DC | PRN

## 2012-06-17 NOTE — Telephone Encounter (Signed)
Refill done.  

## 2012-06-17 NOTE — Telephone Encounter (Signed)
Called patient advised Rx ready, need to know where to send this. Left message for her to call me back to advise.

## 2012-06-17 NOTE — Telephone Encounter (Signed)
Patient advised it is done, she wants to pick this up, it is placed at front desk.

## 2012-06-20 ENCOUNTER — Telehealth: Payer: Self-pay

## 2012-06-20 NOTE — Telephone Encounter (Signed)
Pt just received a rx for stadol 10mg m, pt would like to have another rx written for next week. [facility]# [phone]

## 2012-06-20 NOTE — Telephone Encounter (Signed)
Sorry I do not write for that medication in advance. She can call when she is due for a refill.

## 2012-06-20 NOTE — Telephone Encounter (Signed)
Notified pt that she should CB next week when RF is due. Pt asked when Dr Laney Pastor is in next week for her to call so that he will get the request. Gave pt his schedule.

## 2012-06-23 ENCOUNTER — Telehealth: Payer: Self-pay

## 2012-06-23 DIAGNOSIS — G43909 Migraine, unspecified, not intractable, without status migrainosus: Secondary | ICD-10-CM

## 2012-06-23 NOTE — Telephone Encounter (Signed)
Patient called last week for early renewal of this medication. Was advised too soon, now would like request sent to Dr Laney Pastor only.

## 2012-06-23 NOTE — Telephone Encounter (Signed)
Pt is requesting Dr. Laney Pastor to refill butorphanol (STADOL) 10 MG/ML nasal spray  CBN:  [phone]

## 2012-06-24 MED ORDER — BUTORPHANOL TARTRATE 10 MG/ML NA SOLN: 1.0000 | NASAL | Status: DC | PRN

## 2012-06-24 NOTE — Telephone Encounter (Signed)
Okay to refill 06/23/2012

## 2012-06-25 NOTE — Telephone Encounter (Signed)
LMOM to [facility] with pharmacy choice.

## 2012-06-25 NOTE — Telephone Encounter (Signed)
Patient wants to pick this up

## 2012-07-15 ENCOUNTER — Encounter: Payer: Self-pay | Admitting: Family Medicine

## 2012-07-22 ENCOUNTER — Ambulatory Visit (INDEPENDENT_AMBULATORY_CARE_PROVIDER_SITE_OTHER): Payer: 59 | Admitting: Internal Medicine

## 2012-07-22 VITALS — BP 142/70 | HR 95 | Temp 98.3°F | Resp 18 | Ht <= 58 in | Wt 161.6 lb

## 2012-07-22 DIAGNOSIS — G40909 Epilepsy, unspecified, not intractable, without status epilepticus: Secondary | ICD-10-CM

## 2012-07-22 DIAGNOSIS — Z0000 Encounter for general adult medical examination without abnormal findings: Secondary | ICD-10-CM

## 2012-07-22 DIAGNOSIS — G43909 Migraine, unspecified, not intractable, without status migrainosus: Secondary | ICD-10-CM

## 2012-07-22 DIAGNOSIS — Z23 Encounter for immunization: Secondary | ICD-10-CM

## 2012-07-22 DIAGNOSIS — L989 Disorder of the skin and subcutaneous tissue, unspecified: Secondary | ICD-10-CM

## 2012-07-22 MED ORDER — BUTORPHANOL TARTRATE 10 MG/ML NA SOLN: 1.0000 | NASAL | Status: DC | PRN

## 2012-07-22 MED ORDER — PHENOBARBITAL 100 MG PO TABS: 100.0000 mg | ORAL_TABLET | ORAL | Status: DC

## 2012-07-22 NOTE — Progress Notes (Signed)
  Subjective:    Patient ID: Natalie [name], female    DOB: 10-12-1950, 61 y.o.   MRN: [id number]  HPIhere for med refills and new skin lesion 1. Seizure disorder  PHENObarbital (LUMINAL) 100 MG tablet  2. Migraine  butorphanol (STADOL) 10 MG/ML nasal spray  3. Preventative health care  Pneumococcal polysaccharide vaccine 23-valent greater than or equal to 2yo subcutaneous/IM      No seizure problems in many years but she is reluctant to discontinue this dose of medication Migraines have been stable and responded to home care with Stadol since mid August Still has considerable trouble with weather fronts  Husband notes new skin lesion on left arm for 6 weeks or so/asymptomatic/may have followed a flu shot but she thinks it was in a different spot     Review of Systems Other medical problems stable Patient Active Problem List  Diagnosis  . DM (diabetes mellitus)  . HTN (hypertension)  . Hyperlipemia  . Hypothyroidism  . Seizure disorder  . Insomnia  . Migraine       Objective:   Physical Exam Filed Vitals:   07/22/12 0930  BP: 142/70  Pulse: 95  Temp: 98.3 F (36.8 C)  Resp: 18  Weight 161 Left shoulder has an erosion lesion with missing tissue that is red down to the point where there is mild vesiculation at the depth of the lesion Neurological intact Mood good        Assessment & Plan:   1. Seizure disorder  PHENObarbital (LUMINAL) 100 MG tablet  2. Migraine  butorphanol (STADOL) 10 MG/ML nasal spray  3. Preventative health care  Pneumococcal polysaccharide vaccine 23-valent greater than or equal to 2yo subcutaneous/IM  4. Skin erosion     Refer to dermatology in [place]/Dr Dasher Meds ordered this encounter  Medications  . PHENObarbital (LUMINAL) 100 MG tablet    Sig: Take 1 tablet (100 mg total) by mouth 1 day or 1 dose. 2 times daily on Monday Wednesday and Friday    Dispense:  120 tablet    Refill:  3  . butorphanol (STADOL) 10 MG/ML nasal spray    Sig: Place 1 spray into the nose every 4 (four) hours as needed for pain.    Dispense:  2.5 mL    Refill:  3   1 month

## 2012-08-05 ENCOUNTER — Ambulatory Visit (INDEPENDENT_AMBULATORY_CARE_PROVIDER_SITE_OTHER): Payer: 59 | Admitting: Family Medicine

## 2012-08-05 VITALS — BP 102/68 | HR 106 | Temp 98.4°F | Resp 18 | Ht <= 58 in | Wt 161.2 lb

## 2012-08-05 DIAGNOSIS — E119 Type 2 diabetes mellitus without complications: Secondary | ICD-10-CM

## 2012-08-05 DIAGNOSIS — M545 Low back pain: Secondary | ICD-10-CM

## 2012-08-05 DIAGNOSIS — IMO0001 Reserved for inherently not codable concepts without codable children: Secondary | ICD-10-CM

## 2012-08-05 DIAGNOSIS — N390 Urinary tract infection, site not specified: Secondary | ICD-10-CM

## 2012-08-05 LAB — POCT URINALYSIS DIPSTICK
Nitrite, UA: NEGATIVE
Urobilinogen, UA: 0.2
pH, UA: 5.5

## 2012-08-05 LAB — POCT UA - MICROSCOPIC ONLY

## 2012-08-05 MED ORDER — LEVOFLOXACIN 500 MG PO TABS: 500.0000 mg | ORAL_TABLET | Freq: Every day | ORAL | Status: DC

## 2012-08-05 NOTE — Patient Instructions (Addendum)
1. Frequency  POCT UA - Microscopic Only, POCT urinalysis dipstick  2. Lower back pain  POCT UA - Microscopic Only, POCT urinalysis dipstick  3. UTI (lower urinary tract infection)  levofloxacin (LEVAQUIN) 500 MG tablet, Urine culture  4. Diabetes mellitus  POCT glucose (manual entry), POCT glycosylated hemoglobin (Hb A1C)

## 2012-08-05 NOTE — Progress Notes (Signed)
[place], [place]  [id number]   [id number]  Subjective:    Patient ID: Natalie [name], female    DOB: 04-29-1951, 61 y.o.   MRN: [id number]  HPIThis 61 y.o. female presents for evaluation of fever, back pain.  +feverish; +chills/sweats.  Taking Ibuprofen for fever.  L sided back pain.  No dysuria but never does with UTI.  No hematuria.  +frequency onset yesterday.  +urgency.  Nocturia x 4; baseline x 0.  No vaginal discharge; no vaginal irritation.  History of recurrent UTI; two episodes per year.  History of nephrolithiasis; symptoms not consistent with kidney stones.  Not checking sugars; glucometer not working.      Review of Systems  Constitutional: Positive for fever, chills and fatigue. Negative for diaphoresis.  Gastrointestinal: Negative for nausea, vomiting, abdominal pain and diarrhea.  Genitourinary: Positive for urgency, frequency and flank pain. Negative for dysuria, hematuria, vaginal bleeding, vaginal discharge, genital sores, vaginal pain and pelvic pain.  Musculoskeletal: Positive for back pain.  Neurological: Negative for weakness and numbness.        Past Medical History  Diagnosis Date  . Hyperlipidemia   . Hypertension   . Diabetes mellitus without complication     Past Surgical History  Procedure Date  . Cholecystectomy   . Cesarean section     Prior to Admission medications   Medication Sig Start Date End Date Taking? Authorizing Provider  levothyroxine (SYNTHROID, LEVOTHROID) 75 MCG tablet Take 1 tablet (75 mcg total) by mouth daily. 03/25/12  Yes Ryan M Dunn, PA-C  lisinopril (PRINIVIL,ZESTRIL) 20 MG tablet Take 1 tablet (20 mg total) by mouth daily. 03/19/12  Yes Leandrew Koyanagi, MD  metFORMIN (GLUCOPHAGE) 850 MG tablet Take 1 tablet (850 mg total) by mouth daily with breakfast. 03/19/12  Yes Leandrew Koyanagi, MD  metoprolol (LOPRESSOR) 100 MG tablet Take 1 tablet (100 mg total) by mouth 2 (two) times daily. 03/20/12  Yes Chelle S  Jeffery, PA-C  ondansetron (ZOFRAN-ODT) 8 MG disintegrating tablet Take 1 tablet (8 mg total) by mouth every 8 (eight) hours as needed. 03/19/12  Yes Leandrew Koyanagi, MD  pravastatin (PRAVACHOL) 40 MG tablet Take 1 tablet (40 mg total) by mouth daily. 03/20/12 03/20/13 Yes Chelle S Jeffery, PA-C  butorphanol (STADOL) 10 MG/ML nasal spray Place 1 spray into the nose every 4 (four) hours as needed for pain. For 10/18/12 or after 10/01/12   Leandrew Koyanagi, MD  cyclobenzaprine (FLEXERIL) 10 MG tablet Take 1 tablet (10 mg total) by mouth at bedtime. 10/01/12   Leandrew Koyanagi, MD  levofloxacin (LEVAQUIN) 500 MG tablet Take 1 tablet (500 mg total) by mouth daily. 09/29/12   Leandrew Koyanagi, MD  PHENobarbital (LUMINAL) 97.2 MG tablet TWO daily on Monday Wednesday and Friday.ONE daily on other days. 10/01/12   Leandrew Koyanagi, MD  simvastatin (ZOCOR) 40 MG tablet TAKE 1 TABLET DAILY 04/21/12   Collene Leyden, PA-C  zolpidem (AMBIEN) 10 MG tablet Take 1 tablet (10 mg total) by mouth at bedtime as needed. 08/18/12   Leandrew Koyanagi, MD    Allergies  Allergen Reactions  . Compazine   . Cymbalta (Duloxetine Hcl)   . Escitalopram Oxalate Other (See Comments)    Can't sleep  . Methadone Hives  . Nitrofuran Derivatives     History   Social History  . Marital Status: Married    Spouse Name: N/A    Number of Children: N/A  .  Years of Education: N/A   Occupational History  . Not on file.   Social History Main Topics  . Smoking status: Never Smoker   . Smokeless tobacco: Not on file  . Alcohol Use: Not on file  . Drug Use: Not on file  . Sexually Active: Not on file   Other Topics Concern  . Not on file   Social History Narrative  . No narrative on file    Family History  Problem Relation Age of Onset  . Heart disease Father   . Diabetes Father     Objective:   Physical Exam  Nursing note and vitals reviewed. Constitutional: She appears well-developed and well-nourished.  No distress.  Cardiovascular: Normal rate and regular rhythm.   Pulmonary/Chest: Effort normal and breath sounds normal. She has no wheezes. She has no rales.  Abdominal: Soft. Bowel sounds are normal. She exhibits no distension. There is tenderness in the suprapubic area. There is no rebound, no guarding and no CVA tenderness. No hernia.  Musculoskeletal:       Lumbar back: She exhibits decreased range of motion, tenderness and pain. She exhibits no bony tenderness and no spasm.  Skin: She is not diaphoretic.       Results for orders placed in visit on 08/05/12  POCT UA - MICROSCOPIC ONLY      Component Value Range   WBC, Ur, HPF, POC TNTC     RBC, urine, microscopic TNTC     Bacteria, U Microscopic       Mucus, UA       Epithelial cells, urine per micros       Crystals, Ur, HPF, POC       Casts, Ur, LPF, POC       Yeast, UA      POCT URINALYSIS DIPSTICK      Component Value Range   Color, UA yellow     Clarity, UA cloudy     Glucose, UA neg     Bilirubin, UA neg     Ketones, UA neg     Spec Grav, UA 1.025     Blood, UA large     pH, UA 5.5     Protein, UA >=300     Urobilinogen, UA 0.2     Nitrite, UA neg     Leukocytes, UA small (1+)     Assessment & Plan:   1. Frequency  POCT UA - Microscopic Only, POCT urinalysis dipstick  2. Lower back pain  POCT UA - Microscopic Only, POCT urinalysis dipstick  3. UTI (lower urinary tract infection)  Urine culture, DISCONTINUED: levofloxacin (LEVAQUIN) 500 MG tablet  4. Diabetes mellitus  POCT glucose (manual entry), POCT glycosylated hemoglobin (Hb A1C)    1. Urinary frequency with increase in nocturia: consistent with UTI; rx for Levaquin provided. RTC for fever, vomiting, inability to keep down liquids. 2. UTI: as above. 3. DMII: rx for glucometer provided; recommended checking sugars daily with acute infection.    Meds ordered this encounter  Medications  . DISCONTD: levofloxacin (LEVAQUIN) 500 MG tablet    Sig: Take 1  tablet (500 mg total) by mouth daily.    Dispense:  10 tablet    Refill:  0

## 2012-08-13 NOTE — Progress Notes (Signed)
Pt scheduled appt with Dr. Laney Pastor for 09/17/12 Natalie [name]

## 2012-08-18 ENCOUNTER — Telehealth: Payer: Self-pay

## 2012-08-18 DIAGNOSIS — G4700 Insomnia, unspecified: Secondary | ICD-10-CM

## 2012-08-18 DIAGNOSIS — G43909 Migraine, unspecified, not intractable, without status migrainosus: Secondary | ICD-10-CM

## 2012-08-18 MED ORDER — ZOLPIDEM TARTRATE 10 MG PO TABS: 10.0000 mg | ORAL_TABLET | Freq: Every evening | ORAL | Status: DC | PRN

## 2012-08-18 MED ORDER — BUTORPHANOL TARTRATE 10 MG/ML NA SOLN: 1.0000 | NASAL | Status: DC | PRN

## 2012-08-18 NOTE — Telephone Encounter (Signed)
Patient advised.

## 2012-08-18 NOTE — Telephone Encounter (Signed)
[place] for ref Meds ordered this encounter  Medications  . zolpidem (AMBIEN) 10 MG tablet    Sig: Take 1 tablet (10 mg total) by mouth at bedtime as needed.    Dispense:  30 tablet    Refill:  5  . butorphanol (STADOL) 10 MG/ML nasal spray    Sig: Place 1 spray into the nose every 4 (four) hours as needed for pain.    Dispense:  2.5 mL    Refill:  3

## 2012-08-18 NOTE — Telephone Encounter (Signed)
Dr Laney Pastor, please advise on renewal of meds, have pended them

## 2012-08-18 NOTE — Telephone Encounter (Signed)
PT IN NEED OF HER STADOL AND AMBIEN. [place] [phone]

## 2012-09-17 ENCOUNTER — Encounter: Payer: Self-pay | Admitting: Internal Medicine

## 2012-09-17 ENCOUNTER — Ambulatory Visit (INDEPENDENT_AMBULATORY_CARE_PROVIDER_SITE_OTHER): Payer: 59 | Admitting: Internal Medicine

## 2012-09-17 VITALS — BP 149/79 | HR 66 | Temp 98.3°F | Resp 17 | Ht <= 58 in | Wt 154.0 lb

## 2012-09-17 DIAGNOSIS — G43909 Migraine, unspecified, not intractable, without status migrainosus: Secondary | ICD-10-CM

## 2012-09-17 DIAGNOSIS — I10 Essential (primary) hypertension: Secondary | ICD-10-CM

## 2012-09-17 DIAGNOSIS — G4700 Insomnia, unspecified: Secondary | ICD-10-CM

## 2012-09-17 DIAGNOSIS — E785 Hyperlipidemia, unspecified: Secondary | ICD-10-CM

## 2012-09-17 DIAGNOSIS — E119 Type 2 diabetes mellitus without complications: Secondary | ICD-10-CM

## 2012-09-17 DIAGNOSIS — N390 Urinary tract infection, site not specified: Secondary | ICD-10-CM | POA: Insufficient documentation

## 2012-09-17 DIAGNOSIS — E039 Hypothyroidism, unspecified: Secondary | ICD-10-CM

## 2012-09-17 LAB — CBC WITH DIFFERENTIAL/PLATELET
Eosinophils Absolute: 0.1 10*3/uL (ref 0.0–0.7)
Eosinophils Relative: 1 % (ref 0–5)
HCT: 39.1 % (ref 36.0–46.0)
Hemoglobin: 12.9 g/dL (ref 12.0–15.0)
Lymphs Abs: 1.9 10*3/uL (ref 0.7–4.0)
MCH: 30.9 pg (ref 26.0–34.0)
MCV: 93.8 fL (ref 78.0–100.0)
Monocytes Relative: 10 % (ref 3–12)
RBC: 4.17 MIL/uL (ref 3.87–5.11)

## 2012-09-17 LAB — POCT UA - MICROSCOPIC ONLY

## 2012-09-17 LAB — POCT URINALYSIS DIPSTICK
Glucose, UA: NEGATIVE
Nitrite, UA: NEGATIVE
Urobilinogen, UA: 0.2

## 2012-09-17 LAB — COMPREHENSIVE METABOLIC PANEL
ALT: 13 U/L (ref 0–35)
CO2: 25 mEq/L (ref 19–32)
Calcium: 10.4 mg/dL (ref 8.4–10.5)
Chloride: 103 mEq/L (ref 96–112)
Creat: 0.89 mg/dL (ref 0.50–1.10)
Glucose, Bld: 116 mg/dL — ABNORMAL HIGH (ref 70–99)
Sodium: 138 mEq/L (ref 135–145)
Total Protein: 7.4 g/dL (ref 6.0–8.3)

## 2012-09-17 LAB — TSH: TSH: 1.048 u[IU]/mL (ref 0.350–4.500)

## 2012-09-17 LAB — LIPID PANEL
Cholesterol: 157 mg/dL (ref 0–200)
Triglycerides: 113 mg/dL (ref <150)

## 2012-09-17 LAB — T4, FREE: Free T4: 1.21 ng/dL (ref 0.80–1.80)

## 2012-09-17 MED ORDER — BUTORPHANOL TARTRATE 10 MG/ML NA SOLN: 1.0000 | NASAL | Status: DC | PRN

## 2012-09-17 NOTE — Progress Notes (Signed)
Subjective:    Patient ID: Natalie [name], female    DOB: 1951/07/01, 61 y.o.   MRN: [id number]  HPI here for followup of recent too numerous to count pyuria but negative culture despite symptomatic urinary tract infection in a woman who has recurring urinary tract infections including pyelonephritis/followed by urology/often has negative cultures despite significant illness Also here for other problems Continues with episodic migraines that are only controlled by use of Stadol and bed rest Still seems that occurrence of headaches is related to weather conditions Blood pressures have been stable at home Diabetes has been stable at home Sleeping well with medication History of seizure disorder with no problems in recent years but she is extremely reluctant to continue the small dose of phenobarbital/she tried weaning from 2 episodes in the 90s with poor results  Has had a flu shot  Review of Systems No vision changes No fatigue or change in activity level No chest pain or palpitations GI stable Urinary symptoms have resolved     Objective:   Physical Exam Vital signs blood pressure 149/79/weight 154 pounds Pupils equal round reactive to light and accommodation/EOMs conjugate Heart regular Mood stable/affect appropriate/judgment good   Results for orders placed in visit on 09/17/12  CBC WITH DIFFERENTIAL      Component Value Range   WBC 6.7  4.0 - 10.5 K/uL   RBC 4.17  3.87 - 5.11 MIL/uL   Hemoglobin 12.9  12.0 - 15.0 g/dL   HCT 39.1  36.0 - 46.0 %   MCV 93.8  78.0 - 100.0 fL   MCH 30.9  26.0 - 34.0 pg   MCHC 33.0  30.0 - 36.0 g/dL   RDW 15.3  11.5 - 15.5 %   Platelets 344  150 - 400 K/uL   Neutrophils Relative 60  43 - 77 %   Neutro Abs 4.0  1.7 - 7.7 K/uL   Lymphocytes Relative 29  12 - 46 %   Lymphs Abs 1.9  0.7 - 4.0 K/uL   Monocytes Relative 10  3 - 12 %   Monocytes Absolute 0.6  0.1 - 1.0 K/uL   Eosinophils Relative 1  0 - 5 %   Eosinophils Absolute 0.1  0.0 - 0.7  K/uL   Basophils Relative 0  0 - 1 %   Basophils Absolute 0.0  0.0 - 0.1 K/uL   Smear Review Criteria for review not met    COMPREHENSIVE METABOLIC PANEL      Component Value Range   Sodium 138  135 - 145 mEq/L   Potassium 4.6  3.5 - 5.3 mEq/L   Chloride 103  96 - 112 mEq/L   CO2 25  19 - 32 mEq/L   Glucose, Bld 116 (*) 70 - 99 mg/dL   BUN 14  6 - 23 mg/dL   Creat 0.89  0.50 - 1.10 mg/dL   Total Bilirubin 0.3  0.3 - 1.2 mg/dL   Alkaline Phosphatase 69  39 - 117 U/L   AST 17  0 - 37 U/L   ALT 13  0 - 35 U/L   Total Protein 7.4  6.0 - 8.3 g/dL   Albumin 4.3  3.5 - 5.2 g/dL   Calcium 10.4  8.4 - 10.5 mg/dL  LIPID PANEL      Component Value Range   Cholesterol 157  0 - 200 mg/dL   Triglycerides 113  <150 mg/dL   HDL 54  >39 mg/dL   Total CHOL/HDL Ratio 2.9  VLDL 23  0 - 40 mg/dL   LDL Cholesterol 80  0 - 99 mg/dL  POCT GLYCOSYLATED HEMOGLOBIN (HGB A1C)      Component Value Range   Hemoglobin A1C 7.5    TSH      Component Value Range   TSH 1.048  0.350 - 4.500 uIU/mL  T4, FREE      Component Value Range   Free T4 1.21  0.80 - 1.80 ng/dL  POCT URINALYSIS DIPSTICK      Component Value Range   Color, UA yellow     Clarity, UA cloudy     Glucose, UA neg     Bilirubin, UA neg     Ketones, UA neg     Spec Grav, UA 1.010     Blood, UA mod     pH, UA 5.0     Protein, UA neg     Urobilinogen, UA 0.2     Nitrite, UA neg     Leukocytes, UA moderate (2+)    POCT UA - MICROSCOPIC ONLY      Component Value Range   WBC, Ur, HPF, POC tntc     RBC, urine, microscopic tntc     Bacteria, U Microscopic 3+     Mucus, UA pos     Epithelial cells, urine per micros 3-4     Crystals, Ur, HPF, POC neg     Casts, Ur, LPF, POC neg     Yeast, UA neg          Assessment & Plan:   1. Migraine   refill Stadol  2. Insomnia   no change   3. Hypothyroidism   no change   4. Hyperlipemia   no change   5. HTN (hypertension)   no change   6. DM (diabetes mellitus)   increase exercise    7. Recurrent UTI   will repeat a course of antibiotics even though she is asymptomatic and follow this closely due to her risk for pyelonephritis

## 2012-09-19 ENCOUNTER — Encounter: Payer: Self-pay | Admitting: Internal Medicine

## 2012-09-29 ENCOUNTER — Other Ambulatory Visit: Payer: Self-pay | Admitting: *Deleted

## 2012-09-29 MED ORDER — LEVOFLOXACIN 500 MG PO TABS: 500.0000 mg | ORAL_TABLET | Freq: Every day | ORAL | Status: DC

## 2012-10-01 ENCOUNTER — Ambulatory Visit: Payer: 59

## 2012-10-01 ENCOUNTER — Ambulatory Visit (INDEPENDENT_AMBULATORY_CARE_PROVIDER_SITE_OTHER): Payer: 59 | Admitting: Internal Medicine

## 2012-10-01 VITALS — BP 168/79 | HR 71 | Temp 98.2°F | Resp 16 | Ht <= 58 in | Wt 154.6 lb

## 2012-10-01 DIAGNOSIS — G43909 Migraine, unspecified, not intractable, without status migrainosus: Secondary | ICD-10-CM

## 2012-10-01 DIAGNOSIS — G4700 Insomnia, unspecified: Secondary | ICD-10-CM

## 2012-10-01 DIAGNOSIS — E039 Hypothyroidism, unspecified: Secondary | ICD-10-CM

## 2012-10-01 DIAGNOSIS — G40909 Epilepsy, unspecified, not intractable, without status epilepticus: Secondary | ICD-10-CM

## 2012-10-01 DIAGNOSIS — M549 Dorsalgia, unspecified: Secondary | ICD-10-CM

## 2012-10-01 DIAGNOSIS — N390 Urinary tract infection, site not specified: Secondary | ICD-10-CM

## 2012-10-01 DIAGNOSIS — E119 Type 2 diabetes mellitus without complications: Secondary | ICD-10-CM

## 2012-10-01 DIAGNOSIS — I10 Essential (primary) hypertension: Secondary | ICD-10-CM

## 2012-10-01 DIAGNOSIS — E785 Hyperlipidemia, unspecified: Secondary | ICD-10-CM

## 2012-10-01 LAB — POCT URINALYSIS DIPSTICK
Glucose, UA: NEGATIVE
Nitrite, UA: NEGATIVE
Urobilinogen, UA: 0.2

## 2012-10-01 LAB — POCT UA - MICROSCOPIC ONLY: Mucus, UA: NEGATIVE

## 2012-10-01 MED ORDER — PHENOBARBITAL 100 MG PO TABS: ORAL_TABLET | ORAL | Status: DC

## 2012-10-01 MED ORDER — BUTORPHANOL TARTRATE 10 MG/ML NA SOLN: 1.0000 | NASAL | Status: DC | PRN

## 2012-10-01 MED ORDER — PHENOBARBITAL 97.2 MG PO TABS: ORAL_TABLET | ORAL | Status: DC

## 2012-10-01 MED ORDER — CYCLOBENZAPRINE HCL 10 MG PO TABS: 10.0000 mg | ORAL_TABLET | Freq: Every day | ORAL | Status: DC

## 2012-10-01 NOTE — Progress Notes (Signed)
Subjective:    Patient ID: CAMRIE ROTTMANN, female    DOB: 02-Mar-1951, 62 y.o.   MRN: [id number]  HPIpain in upper back for past 2weeks/NKI Noted on awakening one day/upper thoracic area No neck complaints but pain as uses shoulders, especially the right Hurts to touch/ No problems with sleep No numbness in hands or arms/no weakness in hands  Also needs refill for phenobarbital-there are problems with supplies of 100 mg pill at the pharmacy and with mail order History of seizure disorder for many years/last tried to discontinue medication more than 15 years ago and had a seizure immediately, so even though she has been advised by neurology at different points to discontinue her medication, she remains too anxious to do this.  C. history of recurrent urinary tract infection-recently treated and posttreatment urine culture remained positive/retreatment with Levaquin was called to pharmacy but she is just picking this up today/she is asymptomatic at this point  Her next refill of Stadol for control of migraine headaches is due mid January and she would like to pick this up today  Review of Systems  Constitutional: Negative for fever and fatigue.  Eyes: Negative for visual disturbance.  Respiratory: Negative for shortness of breath.   Cardiovascular: Negative for chest pain and palpitations.  Genitourinary: Negative for dysuria, urgency, frequency, hematuria, difficulty urinating and pelvic pain.  Psychiatric/Behavioral: Negative for dysphoric mood.       Objective:   Physical Exam Blood pressure 168/79  Neck has good range of motion with pain at full forward flexion and full extension Tender palpation over the upper thoracic area bilaterally extending to the lowest cervical vertebra Shoulder range of motion is good bilaterally Neurological intact in the arms and hands Pain exacerbated by rotational movements   [facility] reading (PRIMARY) by  Dr. Stasia Cavalier bony changes/thoracic  scoliosis mild   Results for orders placed in visit on 10/01/12  POCT UA - MICROSCOPIC ONLY      Component Value Range   WBC, Ur, HPF, POC 18-20     RBC, urine, microscopic 9-12     Bacteria, U Microscopic trace     Mucus, UA neg     Epithelial cells, urine per micros 3-4     Crystals, Ur, HPF, POC neg     Casts, Ur, LPF, POC neg     Yeast, UA neg    POCT URINALYSIS DIPSTICK      Component Value Range   Color, UA yellow     Clarity, UA clear     Glucose, UA neg     Bilirubin, UA neg     Ketones, UA neg     Spec Grav, UA 1.020     Blood, UA large     pH, UA 5.5     Protein, UA 30     Urobilinogen, UA 0.2     Nitrite, UA neg     Leukocytes, UA small (1+)          Assessment & Plan:   1. Seizure disorder -refill phenobarbital   2. Recurrent UTI -reculture /start Levaquin 500 daily for 10 days   3. Migraine -refill Stadol   4. Back pain -thoracic spasm /Flexeril at bedtime /Tylenol if needed /range of motion exercises at gym w/ daughter and granddaughter /heat   5. HTN (hypertension) -follow home blood pressures and if elevated over the next month will add medication    Meds ordered this encounter  Medications  . DISCONTD: PHENObarbital (LUMINAL) 100 MG tablet  Sig: TWO daily on Monday Wednesday and Friday.ONE daily on other days.    Dispense:  45 tablet    Refill:  5-----the pharmacy called and 100 mg tablets are no longer available   . cyclobenzaprine (FLEXERIL) 10 MG tablet    Sig: Take 1 tablet (10 mg total) by mouth at bedtime.    Dispense:  30 tablet    Refill:  0  . butorphanol (STADOL) 10 MG/ML nasal spray    Sig: Place 1 spray into the nose every 4 (four) hours as needed for pain. For 10/18/12 or after    Dispense:  2.5 mL    Refill:  3--  . PHENobarbital (LUMINAL) 97.2 MG tablet    Sig: TWO daily on Monday Wednesday and Friday.ONE daily on other days.    Dispense:  45 tablet    Refill:  5

## 2012-10-03 LAB — URINE CULTURE
Colony Count: NO GROWTH
Organism ID, Bacteria: NO GROWTH

## 2012-11-07 ENCOUNTER — Telehealth: Payer: Self-pay | Admitting: *Deleted

## 2012-11-07 DIAGNOSIS — G43909 Migraine, unspecified, not intractable, without status migrainosus: Secondary | ICD-10-CM

## 2012-11-07 NOTE — Telephone Encounter (Signed)
lmom to see if pt uses [facility] for any of her Diabetic supplies.  Form is at nurses station in phone messages.

## 2012-11-10 MED ORDER — BUTORPHANOL TARTRATE 10 MG/ML NA SOLN: 1.0000 | NASAL | Status: DC | PRN

## 2012-11-10 NOTE — Telephone Encounter (Signed)
Patient is requesting butorphanol (STADOL) 10 MG/ML nasal spray Refill   CBN:  [phone]

## 2012-11-10 NOTE — Progress Notes (Signed)
Reviewed and agree.

## 2012-11-10 NOTE — Telephone Encounter (Signed)
Patient advised Rx ready to be picked up

## 2012-11-10 NOTE — Telephone Encounter (Signed)
I called patient, looks like she had 3 refills, has she used all these?

## 2012-11-10 NOTE — Telephone Encounter (Signed)
Patient has advised she has used all her refills, picked up last one on Friday. Please advise on refill. I did tell her she is due for follow up, she was transferred for appt .

## 2012-11-10 NOTE — Telephone Encounter (Signed)
Meds ordered this encounter  Medications  . butorphanol (STADOL) 10 MG/ML nasal spray    Sig: Place 1 spray into the nose every 4 (four) hours as needed for pain.    Dispense:  2.5 mL    Refill:  3

## 2012-11-17 ENCOUNTER — Telehealth: Payer: Self-pay | Admitting: *Deleted

## 2012-11-17 NOTE — Telephone Encounter (Signed)
lmom to see if pt uses [facility] for any of her Diabetic supplies. Form is at nurses station in phone messages. (message from 11/07/12)

## 2012-12-02 NOTE — Telephone Encounter (Signed)
Pt stated that she doesn't want [place] to order her supplies from [facility] at this time. She is coming for appt 12/24/12 and will D/W Liset Mcmonigle then. She is not able to get her supplies covered anymore by insurance and needs to check different options.

## 2012-12-03 ENCOUNTER — Encounter: Payer: Self-pay | Admitting: *Deleted

## 2012-12-07 ENCOUNTER — Telehealth: Payer: Self-pay

## 2012-12-07 DIAGNOSIS — G43909 Migraine, unspecified, not intractable, without status migrainosus: Secondary | ICD-10-CM

## 2012-12-07 MED ORDER — BUTORPHANOL TARTRATE 10 MG/ML NA SOLN: 1.0000 | NASAL | Status: DC | PRN

## 2012-12-07 NOTE — Telephone Encounter (Signed)
Meds ordered this encounter  Medications  . butorphanol (STADOL) 10 MG/ML nasal spray    Sig: Place 1 spray into the nose every 4 (four) hours as needed for pain.    Dispense:  2.5 mL    Refill:  3

## 2012-12-07 NOTE — Telephone Encounter (Signed)
Pt needing refill of prescription (stabol) her dr is [employer]

## 2012-12-08 NOTE — Telephone Encounter (Signed)
Left message Stadol ready for pick up.

## 2012-12-09 ENCOUNTER — Other Ambulatory Visit: Payer: Self-pay | Admitting: Internal Medicine

## 2012-12-24 ENCOUNTER — Ambulatory Visit (INDEPENDENT_AMBULATORY_CARE_PROVIDER_SITE_OTHER): Payer: 59 | Admitting: Internal Medicine

## 2012-12-24 ENCOUNTER — Encounter: Payer: Self-pay | Admitting: Internal Medicine

## 2012-12-24 VITALS — BP 136/66 | HR 72 | Temp 98.1°F | Resp 16 | Ht <= 58 in | Wt 153.8 lb

## 2012-12-24 DIAGNOSIS — E119 Type 2 diabetes mellitus without complications: Secondary | ICD-10-CM

## 2012-12-24 DIAGNOSIS — E039 Hypothyroidism, unspecified: Secondary | ICD-10-CM

## 2012-12-24 DIAGNOSIS — I10 Essential (primary) hypertension: Secondary | ICD-10-CM

## 2012-12-24 DIAGNOSIS — G4700 Insomnia, unspecified: Secondary | ICD-10-CM

## 2012-12-24 DIAGNOSIS — G43909 Migraine, unspecified, not intractable, without status migrainosus: Secondary | ICD-10-CM

## 2012-12-24 DIAGNOSIS — G40909 Epilepsy, unspecified, not intractable, without status epilepticus: Secondary | ICD-10-CM

## 2012-12-24 MED ORDER — BUTORPHANOL TARTRATE 10 MG/ML NA SOLN: 1.0000 | NASAL | Status: DC | PRN

## 2012-12-24 MED ORDER — LEVOTHYROXINE SODIUM 75 MCG PO TABS: 75.0000 ug | ORAL_TABLET | Freq: Every day | ORAL | Status: DC

## 2012-12-24 MED ORDER — LISINOPRIL 20 MG PO TABS: 20.0000 mg | ORAL_TABLET | Freq: Every day | ORAL | Status: DC

## 2012-12-24 MED ORDER — ATORVASTATIN CALCIUM 20 MG PO TABS: 20.0000 mg | ORAL_TABLET | Freq: Every day | ORAL | Status: DC

## 2012-12-24 MED ORDER — METOPROLOL TARTRATE 100 MG PO TABS: 100.0000 mg | ORAL_TABLET | Freq: Two times a day (BID) | ORAL | Status: DC

## 2012-12-24 MED ORDER — METFORMIN HCL 850 MG PO TABS: 850.0000 mg | ORAL_TABLET | Freq: Every day | ORAL | Status: DC

## 2012-12-24 MED ORDER — ZOLPIDEM TARTRATE 10 MG PO TABS: 10.0000 mg | ORAL_TABLET | Freq: Every evening | ORAL | Status: DC | PRN

## 2012-12-24 MED ORDER — PHENOBARBITAL 97.2 MG PO TABS: ORAL_TABLET | ORAL | Status: DC

## 2012-12-24 NOTE — Progress Notes (Signed)
Subjective:    Patient ID: Natalie [name], female    DOB: 1951-04-24, 62 y.o.   MRN: [id number]  HPI here for followup and medication refills Patient Active Problem List  Diagnosis  . DM (diabetes mellitus)--stable on Glucophage 850//no neuropathy symptoms/no polyuria/no vision changes   . HTN (hypertension)-asymptomatic /no home blood pressures   . Hyperlipemia-Pravachol no longer available on four dollar list-now too expensive   . Hypothyroidism-stable /lab recheck normal 2 months ago   . Seizure disorder-stable on phenobarbital [name] is afraid to discontinue this   . Insomnia-Ambien working well   . Migraine--has had serious problems over the past 4 weeks with the tremendous weather changes and numerous low-pressure systems. sHe is now requiring 1 vial stadol weekly to function///symptoms include onset in the right frontal area accompanied by nausea and vomiting and photophobia as in the past   . Recurrent UTI-none recently         Review of Systems  Constitutional: Negative for activity change, appetite change, fatigue and unexpected weight change.  HENT: Negative for hearing loss, trouble swallowing and neck pain.   Eyes: Negative for visual disturbance.  Respiratory: Negative for cough and shortness of breath.   Cardiovascular: Negative for chest pain, palpitations and leg swelling.  Gastrointestinal: Negative for abdominal pain.  Genitourinary: Negative for dysuria and frequency.  Musculoskeletal: Negative for myalgias, back pain and joint swelling.  Skin: Negative for rash.  Neurological: Negative for tremors, syncope and weakness.  Psychiatric/Behavioral: Negative for sleep disturbance and dysphoric mood.       Objective:   Physical Exam BP 136/66  Pulse 72  Temp(Src) 98.1 F (36.7 C) (Oral)  Resp 16  Ht 4\' 5"  (1.346 m)  Wt 153 lb 12.8 oz (69.763 kg)  BMI 38.51 kg/m2  SpO2 98% PERRLA/Eoms conjugate No thyromegaly or lymphadenopathy Heart regular without  murmur No peripheral edema Cranial nerves II through XII intact Deep tendon reflexes symmetrical No sensory losses in the feet Mood good/affect appropriate  Hemoglobin A1c 7.1 (down from 7.5 3 months ago)       Assessment & Plan:  Problem #1 diabetes -well-controlled / home glucose testing nonnecessary Continue Glucophage 850/diet/exercise  Problem #2 hyperlipidemia-change to Lipitor 20  Problem #3 hypertension-continue lisinopril 20  Problem #4 recurrent migraines-continue metoprolol/Stadol/Zofran when needed  Problem #5 insomnia-continue Ambien  Problem #6 seizure disorder-continue phenobarbital  Problem #7 hypothyroidism continue Synthroid  Meds ordered this encounter  Medications  . metFORMIN (GLUCOPHAGE) 850 MG tablet    Sig: Take 1 tablet (850 mg total) by mouth daily with breakfast.    Dispense:  90 tablet    Refill:  1  . levothyroxine (SYNTHROID, LEVOTHROID) 75 MCG tablet    Sig: Take 1 tablet (75 mcg total) by mouth daily.    Dispense:  90 tablet    Refill:  2  . lisinopril (PRINIVIL,ZESTRIL) 20 MG tablet    Sig: Take 1 tablet (20 mg total) by mouth daily.    Dispense:  90 tablet    Refill:  1  . metoprolol (LOPRESSOR) 100 MG tablet    Sig: Take 1 tablet (100 mg total) by mouth 2 (two) times daily.    Dispense:  180 tablet    Refill:  1  . PHENobarbital (LUMINAL) 97.2 MG tablet    Sig: TWO daily on Monday Wednesday and Friday.ONE daily on other days.    Dispense:  135 tablet    Refill:  3    42mo supply for mail order  .  butorphanol (STADOL) 10 MG/ML nasal spray    Sig: Place 1 spray into the nose every 4 (four) hours as needed for pain.    Dispense:  2.5 mL    Refill:  3-----[facility]  . zolpidem (AMBIEN) 10 MG tablet    Sig: Take 1 tablet (10 mg total) by mouth at bedtime as needed.    Dispense:  30 tablet    Refill:  5-----[facility]  . atorvastatin (LIPITOR) 20 MG tablet    Sig: Take 1 tablet (20 mg total) by mouth daily.    Dispense:  90 tablet     Refill:  3     for mail order   Office visit 45 minutes Full labs in 3-6 months

## 2013-01-26 ENCOUNTER — Telehealth: Payer: Self-pay

## 2013-01-26 DIAGNOSIS — G43909 Migraine, unspecified, not intractable, without status migrainosus: Secondary | ICD-10-CM

## 2013-01-26 MED ORDER — BUTORPHANOL TARTRATE 10 MG/ML NA SOLN: 1.0000 | NASAL | Status: DC | PRN

## 2013-01-26 NOTE — Telephone Encounter (Signed)
PT WOULD LIKE A REFILL ON STADOL, SHE STATES THAT SHE WOULD LIKE TO PICK IT UP. BEST# [phone]

## 2013-01-26 NOTE — Telephone Encounter (Signed)
Meds ordered this encounter  Medications  . butorphanol (STADOL) 10 MG/ML nasal spray    Sig: Place 1 spray into the nose every 4 (four) hours as needed for pain.    Dispense:  2.5 mL    Refill:  3

## 2013-01-27 NOTE — Telephone Encounter (Signed)
Patient advised ready for pick up

## 2013-02-17 ENCOUNTER — Telehealth: Payer: Self-pay

## 2013-02-17 DIAGNOSIS — G43909 Migraine, unspecified, not intractable, without status migrainosus: Secondary | ICD-10-CM

## 2013-02-17 MED ORDER — BUTORPHANOL TARTRATE 10 MG/ML NA SOLN: 1.0000 | NASAL | Status: DC | PRN

## 2013-02-17 NOTE — Telephone Encounter (Signed)
Requesting refills on butorphanol (STADOL) 10 MG/ML nasal spray   [phone]

## 2013-02-17 NOTE — Telephone Encounter (Signed)
Meds ordered this encounter  Medications  . butorphanol (STADOL) 10 MG/ML nasal spray    Sig: Place 1 spray into the nose every 4 (four) hours as needed for pain.    Dispense:  2.5 mL    Refill:  3

## 2013-02-17 NOTE — Telephone Encounter (Signed)
Please advise, pended.  

## 2013-02-18 NOTE — Telephone Encounter (Signed)
Rx at front desk patient advised.

## 2013-03-14 ENCOUNTER — Telehealth: Payer: Self-pay

## 2013-03-14 DIAGNOSIS — G43909 Migraine, unspecified, not intractable, without status migrainosus: Secondary | ICD-10-CM

## 2013-03-14 NOTE — Telephone Encounter (Signed)
[facility] - PT NEEDS A REFILL ON HER STADOL [phone]

## 2013-03-14 NOTE — Telephone Encounter (Signed)
Please advise refill pended

## 2013-03-15 MED ORDER — BUTORPHANOL TARTRATE 10 MG/ML NA SOLN: 1.0000 | NASAL | Status: DC | PRN

## 2013-03-15 NOTE — Telephone Encounter (Signed)
Ok Meds ordered this encounter  Medications  . butorphanol (STADOL) 10 MG/ML nasal spray    Sig: Place 1 spray into the nose every 4 (four) hours as needed for pain.    Dispense:  2.5 mL    Refill:  3

## 2013-03-15 NOTE — Telephone Encounter (Signed)
Spoke with pt, advised Rx ready to pick up.

## 2013-03-26 ENCOUNTER — Ambulatory Visit (INDEPENDENT_AMBULATORY_CARE_PROVIDER_SITE_OTHER): Payer: 59 | Admitting: Emergency Medicine

## 2013-03-26 VITALS — BP 113/70 | HR 103 | Temp 98.8°F | Resp 18 | Ht <= 58 in | Wt 154.8 lb

## 2013-03-26 DIAGNOSIS — R509 Fever, unspecified: Secondary | ICD-10-CM

## 2013-03-26 DIAGNOSIS — R109 Unspecified abdominal pain: Secondary | ICD-10-CM

## 2013-03-26 DIAGNOSIS — N10 Acute tubulo-interstitial nephritis: Secondary | ICD-10-CM

## 2013-03-26 DIAGNOSIS — R350 Frequency of micturition: Secondary | ICD-10-CM

## 2013-03-26 LAB — POCT URINALYSIS DIPSTICK
Protein, UA: 100
Spec Grav, UA: 1.025
Urobilinogen, UA: 0.2

## 2013-03-26 LAB — COMPREHENSIVE METABOLIC PANEL
ALT: 8 U/L (ref 0–35)
CO2: 24 mEq/L (ref 19–32)
Calcium: 9.6 mg/dL (ref 8.4–10.5)
Chloride: 102 mEq/L (ref 96–112)
Sodium: 137 mEq/L (ref 135–145)
Total Protein: 7 g/dL (ref 6.0–8.3)

## 2013-03-26 LAB — POCT CBC
Granulocyte percent: 76.7 %G (ref 37–80)
Hemoglobin: 11.3 g/dL — AB (ref 12.2–16.2)
MPV: 9.5 fL (ref 0–99.8)
POC Granulocyte: 9.9 — AB (ref 2–6.9)
POC MID %: 7.8 %M (ref 0–12)
RBC: 3.77 M/uL — AB (ref 4.04–5.48)

## 2013-03-26 LAB — POCT UA - MICROSCOPIC ONLY
Crystals, Ur, HPF, POC: NEGATIVE
Mucus, UA: NEGATIVE

## 2013-03-26 MED ORDER — CEFTRIAXONE SODIUM 1 G IJ SOLR
1.0000 g | Freq: Once | INTRAMUSCULAR | Status: AC
  Administered 2013-03-26: 1 g via INTRAMUSCULAR

## 2013-03-26 MED ORDER — LEVOFLOXACIN 500 MG PO TABS: 500.0000 mg | ORAL_TABLET | Freq: Every day | ORAL | Status: AC

## 2013-03-26 MED ORDER — LEVOFLOXACIN 500 MG PO TABS: 500.0000 mg | ORAL_TABLET | Freq: Every day | ORAL | Status: DC

## 2013-03-26 NOTE — Patient Instructions (Signed)

## 2013-03-26 NOTE — Progress Notes (Signed)
Subjective:    Patient ID: Natalie [name], female    DOB: September 19, 1951, 62 y.o.   MRN: [id number]  HPI patient presents with pain in her left flank and lower abdomen. She has a history recurrent urinary tract infections however most recent urinary tract infections have not been positive. She had a positive culture for Escherichia coli in February 2013. She has had recurrent episodes of illness which have responded to antibiotics in the last year. She is not up-to-date on her colonoscopy. She's not had any recent scans. The illness began last night with fevers chills and temperature 103. She initially felt like she pulled a muscle in her back while she was at the pool.    Review of Systems     Objective:   Physical Exam there is tenderness in the left flank area. This seems to be a little lower than the flank towards the left SI joint. Her chest is clear to auscultation and percussion. Cardiac is regular rate without murmurs. Abdomen is obese. There is tenderness to palpation deep in the left lower abdomen. There is no rebound noted. Results for orders placed in visit on 03/26/13  POCT UA - MICROSCOPIC ONLY      Result Value Range   WBC, Ur, HPF, POC 25-30     RBC, urine, microscopic 8-15     Bacteria, U Microscopic trace     Mucus, UA neg     Epithelial cells, urine per micros 1-3     Crystals, Ur, HPF, POC neg     Casts, Ur, LPF, POC hyaline     Yeast, UA nen    POCT URINALYSIS DIPSTICK      Result Value Range   Color, UA yellow     Clarity, UA clear     Glucose, UA neg     Bilirubin, UA neg     Ketones, UA 15     Spec Grav, UA 1.025     Blood, UA moderate     pH, UA 5.5     Protein, UA 100     Urobilinogen, UA 0.2     Nitrite, UA neg     Leukocytes, UA moderate (2+)    POCT CBC      Result Value Range   WBC 12.9 (*) 4.6 - 10.2 K/uL   Lymph, poc 2.0  0.6 - 3.4   POC LYMPH PERCENT 15.5  10 - 50 %L   MID (cbc) 1.0 (*) 0 - 0.9   POC MID % 7.8  0 - 12 %M   POC Granulocyte 9.9  (*) 2 - 6.9   Granulocyte percent 76.7  37 - 80 %G   RBC 3.77 (*) 4.04 - 5.48 M/uL   Hemoglobin 11.3 (*) 12.2 - 16.2 g/dL   HCT, POC 36.7 (*) 37.7 - 47.9 %   MCV 97.4 (*) 80 - 97 fL   MCH, POC 30.0  27 - 31.2 pg   MCHC 30.8 (*) 31.8 - 35.4 g/dL   RDW, POC 15.1     Platelet Count, POC 242  142 - 424 K/uL   MPV 9.5  0 - 99.8 fL         Assessment & Plan:  Urine is very abnormal. White count is. Lab studies would say this is a pyelonephritis. She is slightly anemic. We'll treat with Rocephin and Levaquin. Urine culture has been done. She has not had GI evaluation in years we'll schedule a CT abdomen pelvis as well as  referral to GI for colonoscopy.

## 2013-03-27 LAB — URINE CULTURE: Colony Count: NO GROWTH

## 2013-03-30 ENCOUNTER — Ambulatory Visit (INDEPENDENT_AMBULATORY_CARE_PROVIDER_SITE_OTHER): Payer: 59 | Admitting: Internal Medicine

## 2013-03-30 VITALS — BP 145/83 | HR 84 | Temp 99.0°F | Resp 16 | Ht <= 58 in | Wt 154.0 lb

## 2013-03-30 DIAGNOSIS — Z6833 Body mass index (BMI) 33.0-33.9, adult: Secondary | ICD-10-CM | POA: Insufficient documentation

## 2013-03-30 DIAGNOSIS — E039 Hypothyroidism, unspecified: Secondary | ICD-10-CM

## 2013-03-30 DIAGNOSIS — IMO0001 Reserved for inherently not codable concepts without codable children: Secondary | ICD-10-CM

## 2013-03-30 DIAGNOSIS — E785 Hyperlipidemia, unspecified: Secondary | ICD-10-CM

## 2013-03-30 DIAGNOSIS — E119 Type 2 diabetes mellitus without complications: Secondary | ICD-10-CM

## 2013-03-30 DIAGNOSIS — R350 Frequency of micturition: Secondary | ICD-10-CM

## 2013-03-30 DIAGNOSIS — G43909 Migraine, unspecified, not intractable, without status migrainosus: Secondary | ICD-10-CM

## 2013-03-30 DIAGNOSIS — N390 Urinary tract infection, site not specified: Secondary | ICD-10-CM

## 2013-03-30 DIAGNOSIS — I10 Essential (primary) hypertension: Secondary | ICD-10-CM

## 2013-03-30 LAB — POCT CBC
HCT, POC: 38.8 % (ref 37.7–47.9)
Lymph, poc: 1.6 (ref 0.6–3.4)
MCH, POC: 29.9 pg (ref 27–31.2)
MCV: 96.4 fL (ref 80–97)
MID (cbc): 0.5 (ref 0–0.9)
POC LYMPH PERCENT: 25.7 %L (ref 10–50)
Platelet Count, POC: 317 10*3/uL (ref 142–424)
RDW, POC: 13.8 %
WBC: 6.1 10*3/uL (ref 4.6–10.2)

## 2013-03-30 LAB — POCT URINALYSIS DIPSTICK
Ketones, UA: NEGATIVE
Nitrite, UA: NEGATIVE
Protein, UA: NEGATIVE
Urobilinogen, UA: 0.2

## 2013-03-30 LAB — POCT UA - MICROSCOPIC ONLY: Yeast, UA: NEGATIVE

## 2013-03-30 MED ORDER — BUTORPHANOL TARTRATE 10 MG/ML NA SOLN: 1.0000 | NASAL | Status: DC | PRN

## 2013-03-30 NOTE — Progress Notes (Signed)
Subjective:    Patient ID: Natalie [name], female    DOB: Sep 15, 1951, 62 y.o.   MRN: [id number]  HPI  Presents for follow up of urinary frequency and back/flank pain. Pt reports originally she had a fever of 103 with flank pain and urinary frequency and came to [facility] where she received antibiotics for possible pyelonephritis. Pt has hx of pyelonephritis, frequent UTIs, and kidney stones. She cannot attribute her current pain to any of her past episodes. Pain is worse in LEFT flank but present in right flank as well. Reports even with antibiotics she has felt clammy, feverish, night sweats and chills at times. Continued flank pain. Reports nausea, denies vomiting. Denies dysuria, urgency. Unable to sleep due to pain.   Review of Systems Denies: headache, sinus symptoms, SOB, chest pain, vomiting, diarrhea/constipation, numbness/tingling in extremities.     Objective:   Physical Exam  BP 145/83  Pulse 84  Temp(Src) 99 F (37.2 C) (Oral)  Resp 16  Ht 4\' 9"  (1.448 m)  Wt 154 lb (69.854 kg)  BMI 33.32 kg/m2  SpO2 97%  General: WDWN female, appears stated age, NAD but appears ill HEENT: normocephalic, atraumatic, sclera/conjunctiva clear, no palpable lymphadenopathy  Resp: clear to ausculation anterior & posterior fields with good air entry bilaterally, without rales rhonchi wheezes Cardiac: RRR, no murmurs rubs gallops  Abdomen: normal bowel sounds, soft, non distended, non tender to palpation, + CVA tenderness bilaterally - pain worse on LEFT  Extremities: moves all limbs spontaneously  Neruo: A&O x 3, CN II-XII grossly intact  Skin: no rashes lesions   Results for orders placed in visit on 03/30/13  POCT URINALYSIS DIPSTICK      Result Value Range   Color, UA yellow     Clarity, UA clear     Glucose, UA neg     Bilirubin, UA neg     Ketones, UA neg     Spec Grav, UA 1.015     Blood, UA trace-lysed     pH, UA 5.5     Protein, UA neg     Urobilinogen, UA 0.2     Nitrite, UA neg      Leukocytes, UA small (1+)    POCT UA - MICROSCOPIC ONLY      Result Value Range   WBC, Ur, HPF, POC 4-16     RBC, urine, microscopic 2-8     Bacteria, U Microscopic 1+     Mucus, UA trace     Epithelial cells, urine per micros 0-3     Crystals, Ur, HPF, POC neg     Casts, Ur, LPF, POC neg     Yeast, UA neg    POCT CBC      Result Value Range   WBC 6.1  4.6 - 10.2 K/uL   Lymph, poc 1.6  0.6 - 3.4   POC LYMPH PERCENT 25.7  10 - 50 %L   MID (cbc) 0.5  0 - 0.9   POC MID % 8.1  0 - 12 %M   POC Granulocyte 4.0  2 - 6.9   Granulocyte percent 66.2  37 - 80 %G   RBC 4.02 (*) 4.04 - 5.48 M/uL   Hemoglobin 12.0 (*) 12.2 - 16.2 g/dL   HCT, POC 38.8  37.7 - 47.9 %   MCV 96.4  80 - 97 fL   MCH, POC 29.9  27 - 31.2 pg   MCHC 30.9 (*) 31.8 - 35.4 g/dL   RDW, POC  13.8     Platelet Count, POC 317  142 - 424 K/uL   MPV 8.7  0 - 99.8 fL  GLUCOSE, POCT (MANUAL RESULT ENTRY)      Result Value Range   POC Glucose 132 (*) 70 - 99 mg/dl  POCT GLYCOSYLATED HEMOGLOBIN (HGB A1C)      Result Value Range   Hemoglobin A1C 7.1        Assessment & Plan:

## 2013-03-30 NOTE — Progress Notes (Signed)
  Subjective:    Patient ID: Natalie [name], female    DOB: October 11, 1950, 62 y.o.   MRN: [id number]  HPIsee last OV On Levo and not resolved yet Continues with some urinary frequency and some flank discomfort although is not having fever No abdominal pain///note that urine culture was once again negative. This is her third presentation since 08/2012 with significant pyuria and symptoms including fever but negative urine cultures. She has had at least 2 urology workups that were considered unrevealing. She is known to have multiple stones and has passed them and without trouble. She has routine gynecological followup which has been considered normal. Has had increased migraines since the onset of this infection as is her typical pattern Currently out of Stadol  Other medical problems include: Hypothyroidism- Hyperlipidemia- Diabetes mellitus-needs recheck labs Hypertension History of seizure disorder BMI 33   Review of Systems No fever chills or night sweats No chest pain or palpitations Has not been checking blood sugars at home     Objective:   Physical Exam BP 145/83  Pulse 84  Temp(Src) 99 F (37.2 C) (Oral)  Resp 16  Ht 4\' 9"  (1.448 m)  Wt 154 lb (69.854 kg)  BMI 33.32 kg/m2  SpO2 97% HEENT clear Heart regular without murmur Lungs clear No CVA tenderness to percussion No abdominal tenderness Cranial nerves II through XII intact Gait intact       Results for orders placed in visit on 03/30/13  POCT URINALYSIS DIPSTICK      Result Value Range   Color, UA yellow     Clarity, UA clear     Glucose, UA neg     Bilirubin, UA neg     Ketones, UA neg     Spec Grav, UA 1.015     Blood, UA trace-lysed     pH, UA 5.5     Protein, UA neg     Urobilinogen, UA 0.2     Nitrite, UA neg     Leukocytes, UA small (1+)    POCT UA - MICROSCOPIC ONLY      Result Value Range   WBC, Ur, HPF, POC 4-16     RBC, urine, microscopic 2-8     Bacteria, U Microscopic 1+     Mucus,  UA trace     Epithelial cells, urine per micros 0-3     Crystals, Ur, HPF, POC neg     Casts, Ur, LPF, POC neg     Yeast, UA neg      Assessment & Plan:  Problem #1 recurrent UTIs without clear etiology Recurrent UTI--- with improvement in urine and CBC will finish Levaquin and establish followup with specialist for further evaluation  BMI 33.0-33.9,adult  Migraine - Plan: butorphanol (STADOL) 10 MG/ML nasal spray refills  DM (diabetes mellitus)---A1C remains 7.1  HTN (hypertension)-recheck when well  Hyperlipemia-no change in meds  Hypothyroidism-no change in meds  Meds ordered this encounter  Medications  . butorphanol (STADOL) 10 MG/ML nasal spray    Sig: Place 1 spray into the nose every 4 (four) hours as needed for pain.    Dispense:  2.5 mL    Refill:  3

## 2013-04-01 NOTE — Progress Notes (Signed)
Made appointment and lm regarding appointment being made.

## 2013-04-24 ENCOUNTER — Telehealth: Payer: Self-pay

## 2013-04-24 DIAGNOSIS — G43909 Migraine, unspecified, not intractable, without status migrainosus: Secondary | ICD-10-CM

## 2013-04-24 MED ORDER — BUTORPHANOL TARTRATE 10 MG/ML NA SOLN: 1.0000 | NASAL | Status: DC | PRN

## 2013-04-24 NOTE — Telephone Encounter (Signed)
Patient returning call. States she cannot get this from the pharmacy, she has to pick it up.  Cell #: [phone]

## 2013-04-24 NOTE — Telephone Encounter (Signed)
Meds ordered this encounter  Medications  . butorphanol (STADOL) 10 MG/ML nasal spray    Sig: Place 1 spray into the nose every 4 (four) hours as needed for pain.    Dispense:  2.5 mL    Refill:  3

## 2013-04-24 NOTE — Telephone Encounter (Signed)
PT STATES THAT SHE CALLED Wednesday FOR A REFILL ON STADOL, I DO NOT SEE A RECORD OF A REFILL REQUEST BUT SHE WOULD LIKE TO PICK UP THIS MEDICATION ASAP. BEST# [phone]

## 2013-04-24 NOTE — Telephone Encounter (Signed)
Can you write another rx? Patient indicates pharmacy will not let her refill.

## 2013-04-24 NOTE — Telephone Encounter (Signed)
Last Rx had 3 refills, she should be able to get this at the pharmacy, called her to advise, voice mail box not set up yet

## 2013-05-12 ENCOUNTER — Other Ambulatory Visit: Payer: Self-pay

## 2013-05-12 DIAGNOSIS — G40909 Epilepsy, unspecified, not intractable, without status epilepticus: Secondary | ICD-10-CM

## 2013-05-12 MED ORDER — PHENOBARBITAL 97.2 MG PO TABS: ORAL_TABLET | ORAL | Status: DC

## 2013-05-14 DIAGNOSIS — N200 Calculus of kidney: Secondary | ICD-10-CM

## 2013-05-18 ENCOUNTER — Other Ambulatory Visit: Payer: Self-pay | Admitting: Internal Medicine

## 2013-05-18 DIAGNOSIS — G43909 Migraine, unspecified, not intractable, without status migrainosus: Secondary | ICD-10-CM

## 2013-05-18 MED ORDER — BUTORPHANOL TARTRATE 10 MG/ML NA SOLN: 1.0000 | NASAL | Status: DC | PRN

## 2013-06-09 ENCOUNTER — Telehealth: Payer: Self-pay

## 2013-06-09 DIAGNOSIS — G43909 Migraine, unspecified, not intractable, without status migrainosus: Secondary | ICD-10-CM

## 2013-06-09 MED ORDER — BUTORPHANOL TARTRATE 10 MG/ML NA SOLN: 1.0000 | NASAL | Status: DC | PRN

## 2013-06-09 NOTE — Telephone Encounter (Signed)
Pt notified that rx is ready for pick up

## 2013-06-09 NOTE — Telephone Encounter (Signed)
Meds ordered this encounter  Medications  . butorphanol (STADOL) 10 MG/ML nasal spray    Sig: Place 1 spray into the nose every 4 (four) hours as needed for pain.    Dispense:  2.5 mL    Refill:  3

## 2013-06-09 NOTE — Telephone Encounter (Signed)
PT STATES SHE IS IN NEED OF HER STADOL. PLEASE CALL [phone] WHEN READY FOR P/U

## 2013-06-20 ENCOUNTER — Other Ambulatory Visit: Payer: Self-pay

## 2013-06-20 DIAGNOSIS — E119 Type 2 diabetes mellitus without complications: Secondary | ICD-10-CM

## 2013-06-20 MED ORDER — METFORMIN HCL 850 MG PO TABS: 850.0000 mg | ORAL_TABLET | Freq: Every day | ORAL | Status: DC

## 2013-07-02 ENCOUNTER — Other Ambulatory Visit: Payer: Self-pay

## 2013-07-02 DIAGNOSIS — G43909 Migraine, unspecified, not intractable, without status migrainosus: Secondary | ICD-10-CM

## 2013-07-02 MED ORDER — BUTORPHANOL TARTRATE 10 MG/ML NA SOLN: 1.0000 | NASAL | Status: DC | PRN

## 2013-07-02 NOTE — Telephone Encounter (Signed)
Pended please advise.  

## 2013-07-02 NOTE — Telephone Encounter (Signed)
Patient needs refill on Stadol rx. Please call when ready at [phone]

## 2013-07-17 ENCOUNTER — Other Ambulatory Visit: Payer: Self-pay | Admitting: Internal Medicine

## 2013-07-21 ENCOUNTER — Other Ambulatory Visit: Payer: Self-pay

## 2013-07-25 ENCOUNTER — Telehealth: Payer: Self-pay

## 2013-07-25 DIAGNOSIS — G43909 Migraine, unspecified, not intractable, without status migrainosus: Secondary | ICD-10-CM

## 2013-07-25 NOTE — Telephone Encounter (Signed)
Pt is needing a written rx on her stadol and would like [place] to call when ready to pick up at [phone]

## 2013-07-26 MED ORDER — BUTORPHANOL TARTRATE 10 MG/ML NA SOLN: 1.0000 | NASAL | Status: DC | PRN

## 2013-07-26 NOTE — Telephone Encounter (Signed)
Patient called stated she did receive the call back stating she has refills on RX Stadol, however this is for 3 weeks only. This is prescription pt gets each week. Please give patient call  Back @ [phone].

## 2013-07-26 NOTE — Telephone Encounter (Signed)
Please advise pended

## 2013-07-26 NOTE — Telephone Encounter (Signed)
She has refills!!!!!

## 2013-07-26 NOTE — Telephone Encounter (Signed)
Meds ordered this encounter  Medications  . butorphanol (STADOL) 10 MG/ML nasal spray    Sig: Place 1 spray into the nose every 4 (four) hours as needed for pain.    Dispense:  2.5 mL    Refill:  3

## 2013-07-27 NOTE — Telephone Encounter (Signed)
Pt advised.

## 2013-08-17 ENCOUNTER — Telehealth: Payer: Self-pay

## 2013-08-17 DIAGNOSIS — G43909 Migraine, unspecified, not intractable, without status migrainosus: Secondary | ICD-10-CM

## 2013-08-17 MED ORDER — BUTORPHANOL TARTRATE 10 MG/ML NA SOLN
1.0000 | NASAL | Status: DC | PRN
Start: 2013-08-17 — End: 2013-09-09

## 2013-08-17 NOTE — Telephone Encounter (Addendum)
PT STATES SHE IS IN NEED OF HER STAVOL. PLEASE CALL [phone] WHEN READY FOR PICK UP PT STATED WE WILL TELL HER SHE STILL HAVE REFILLS BUT SHE DOESN'T

## 2013-08-17 NOTE — Telephone Encounter (Signed)
Meds ordered this encounter  Medications  . butorphanol (STADOL) 10 MG/ML nasal spray    Sig: Place 1 spray into the nose every 4 (four) hours as needed.    Dispense:  2.5 mL    Refill:  3

## 2013-08-17 NOTE — Telephone Encounter (Signed)
Called advised ready for pick up

## 2013-09-09 ENCOUNTER — Encounter: Payer: Self-pay | Admitting: Internal Medicine

## 2013-09-09 ENCOUNTER — Ambulatory Visit (INDEPENDENT_AMBULATORY_CARE_PROVIDER_SITE_OTHER): Payer: 59 | Admitting: Internal Medicine

## 2013-09-09 VITALS — BP 146/76 | HR 77 | Temp 98.3°F | Resp 16 | Ht <= 58 in | Wt 155.6 lb

## 2013-09-09 DIAGNOSIS — E039 Hypothyroidism, unspecified: Secondary | ICD-10-CM

## 2013-09-09 DIAGNOSIS — N390 Urinary tract infection, site not specified: Secondary | ICD-10-CM

## 2013-09-09 DIAGNOSIS — Z6833 Body mass index (BMI) 33.0-33.9, adult: Secondary | ICD-10-CM

## 2013-09-09 DIAGNOSIS — L989 Disorder of the skin and subcutaneous tissue, unspecified: Secondary | ICD-10-CM

## 2013-09-09 DIAGNOSIS — G43909 Migraine, unspecified, not intractable, without status migrainosus: Secondary | ICD-10-CM

## 2013-09-09 DIAGNOSIS — G40909 Epilepsy, unspecified, not intractable, without status epilepticus: Secondary | ICD-10-CM

## 2013-09-09 DIAGNOSIS — I10 Essential (primary) hypertension: Secondary | ICD-10-CM

## 2013-09-09 DIAGNOSIS — E785 Hyperlipidemia, unspecified: Secondary | ICD-10-CM

## 2013-09-09 DIAGNOSIS — G4700 Insomnia, unspecified: Secondary | ICD-10-CM

## 2013-09-09 DIAGNOSIS — R350 Frequency of micturition: Secondary | ICD-10-CM

## 2013-09-09 DIAGNOSIS — E119 Type 2 diabetes mellitus without complications: Secondary | ICD-10-CM

## 2013-09-09 LAB — POCT URINALYSIS DIPSTICK
Bilirubin, UA: NEGATIVE
Ketones, UA: NEGATIVE
Protein, UA: 100
Spec Grav, UA: 1.02
pH, UA: 5.5

## 2013-09-09 LAB — POCT UA - MICROSCOPIC ONLY: Crystals, Ur, HPF, POC: NEGATIVE

## 2013-09-09 MED ORDER — ATORVASTATIN CALCIUM 20 MG PO TABS: 20.0000 mg | ORAL_TABLET | Freq: Every day | ORAL | Status: DC

## 2013-09-09 MED ORDER — LEVOTHYROXINE SODIUM 75 MCG PO TABS: 75.0000 ug | ORAL_TABLET | Freq: Every day | ORAL | Status: DC

## 2013-09-09 MED ORDER — METOPROLOL TARTRATE 100 MG PO TABS: 100.0000 mg | ORAL_TABLET | Freq: Two times a day (BID) | ORAL | Status: DC

## 2013-09-09 MED ORDER — METFORMIN HCL 1000 MG PO TABS: 1000.0000 mg | ORAL_TABLET | Freq: Every day | ORAL | Status: DC

## 2013-09-09 MED ORDER — LISINOPRIL 20 MG PO TABS: 20.0000 mg | ORAL_TABLET | Freq: Every day | ORAL | Status: DC

## 2013-09-09 MED ORDER — PHENOBARBITAL 97.2 MG PO TABS: ORAL_TABLET | ORAL | Status: DC

## 2013-09-09 MED ORDER — BUTORPHANOL TARTRATE 10 MG/ML NA SOLN: 1.0000 | NASAL | Status: DC | PRN

## 2013-09-09 MED ORDER — LEVOFLOXACIN 500 MG PO TABS: 500.0000 mg | ORAL_TABLET | Freq: Every day | ORAL | Status: DC

## 2013-09-09 NOTE — Progress Notes (Signed)
Subjective:    Patient ID: Natalie [name], female    DOB: 11-13-1950, 62 y.o.   MRN: [id number]  HPI Patients presents for follow up of DM, HTN. Currently having some urinary frequency. Nocturia x 1. No pain with urination or back pain. No hematuria. No fever.  Does not check blood sugar at home.Has been under increased stress, helping care for a neighbor.  Aware of need for mammogram, wishes to have one in March when she goes on Medicare.  Still has 1-2 migraines a week. Controlled with Stadol.   Has had irritation and drainage on side of right eye for over 1 year. Has been seen by Dr. Laney Pastor as well as Dr. Sabra Heck (optho.). Has used lubricant gel, and baby shampoo. Comes and goes, but never goes away completely. Itches and is periodically painful.   Review of Systems No chest pain, no shortness of breath, no swelling of feet/ankles, no sores that won't heal.    Objective:   Physical Exam  Nursing note and vitals reviewed. Constitutional: She is oriented to person, place, and time. She appears well-developed and well-nourished.  HENT:  Right Ear: External ear normal.  Left Ear: External ear normal.  Nose: Nose normal.  Mouth/Throat: Oropharynx is clear and moist. No oropharyngeal exudate.  Eyes: Conjunctivae are normal.  Approx. 24mm, shallow crack of skin at lateral corner of right eye. No redness or drainage.No ingrown lash noted.  Neck: Normal range of motion. Neck supple.  Cardiovascular: Normal rate, regular rhythm, normal heart sounds and intact distal pulses.   Pulmonary/Chest: Effort normal and breath sounds normal.  Musculoskeletal: Normal range of motion. She exhibits no edema.  Lymphadenopathy:    She has no cervical adenopathy.  Neurological: She is alert and oriented to person, place, and time.  Skin: Skin is dry.  Psychiatric: She has a normal mood and affect. Her behavior is normal. Judgment and thought content normal.      Results for orders placed in  visit on 09/09/13  POCT UA - MICROSCOPIC ONLY      Result Value Range   WBC, Ur, HPF, POC tntc     RBC, urine, microscopic tntc     Bacteria, U Microscopic 1+     Mucus, UA trace     Epithelial cells, urine per micros 0-3     Crystals, Ur, HPF, POC neg     Casts, Ur, LPF, POC neg     Yeast, UA pos    POCT URINALYSIS DIPSTICK      Result Value Range   Color, UA yellow     Clarity, UA turbin     Glucose, UA neg     Bilirubin, UA neg     Ketones, UA neg     Spec Grav, UA 1.020     Blood, UA mod     pH, UA 5.5     Protein, UA 100     Urobilinogen, UA 0.2     Nitrite, UA pos     Leukocytes, UA large (3+)    POCT GLYCOSYLATED HEMOGLOBIN (HGB A1C)      Result Value Range   Hemoglobin A1C 7.7      Assessment & Plan:  Urinary frequency - Plan: POCT UA - Microscopic Only, POCT urinalysis dipstick  Migraine - Plan: butorphanol (STADOL) 10 MG/ML nasal spray, metoprolol (LOPRESSOR) 100 MG tablet, DISCONTINUED: metoprolol (LOPRESSOR) 100 MG tablet  Seizure disorder - Plan: PHENobarbital (LUMINAL) 97.2 MG tablet, DISCONTINUED: PHENobarbital (LUMINAL) 97.2 MG tablet  DM (diabetes mellitus) - Plan: POCT glycosylated hemoglobin (Hb A1C), metFORMIN (GLUCOPHAGE) 1000 MG tablet, DISCONTINUED: metFORMIN (GLUCOPHAGE) 1000 MG tablet  HTN (hypertension) - Plan: lisinopril (PRINIVIL,ZESTRIL) 20 MG tablet, DISCONTINUED: lisinopril (PRINIVIL,ZESTRIL) 20 MG tablet  Hypothyroid - Plan: levothyroxine (SYNTHROID, LEVOTHROID) 75 MCG tablet, DISCONTINUED: levothyroxine (SYNTHROID, LEVOTHROID) 75 MCG tablet  UTI (urinary tract infection) - Plan: Urine culture  Follow up in 3 months for CPE.

## 2013-09-11 LAB — URINE CULTURE: Colony Count: >=100000

## 2013-09-12 ENCOUNTER — Telehealth: Payer: Self-pay | Admitting: Emergency Medicine

## 2013-09-12 NOTE — Telephone Encounter (Signed)
Pt states that she would like [facility] of the [facility] on [address]. [phone]

## 2013-09-14 ENCOUNTER — Telehealth: Payer: Self-pay

## 2013-09-14 DIAGNOSIS — E119 Type 2 diabetes mellitus without complications: Secondary | ICD-10-CM

## 2013-09-14 MED ORDER — METFORMIN HCL 1000 MG PO TABS: 1000.0000 mg | ORAL_TABLET | Freq: Every day | ORAL | Status: DC

## 2013-09-14 NOTE — Telephone Encounter (Signed)
Please send rx for metformin 1000 mg with 90 supply to [facility] on [address].  The rx was sent to [facility] originally, but they will only give her a 30 supply. She did not get the rx filled @ [facility]. Please send to [facility] on [address].

## 2013-09-14 NOTE — Telephone Encounter (Signed)
This was already sent to [facility], resent.

## 2013-09-14 NOTE — Telephone Encounter (Signed)
Hopefully she had this transferred. Called her to advise. She will have this transferred over.

## 2013-10-04 ENCOUNTER — Telehealth: Payer: Self-pay

## 2013-10-04 DIAGNOSIS — G43909 Migraine, unspecified, not intractable, without status migrainosus: Secondary | ICD-10-CM

## 2013-10-04 NOTE — Telephone Encounter (Signed)
Pt needs a refill of Stadol. Please call when ready to pick up. [phone]

## 2013-10-05 MED ORDER — BUTORPHANOL TARTRATE 10 MG/ML NA SOLN: 1.0000 | NASAL | Status: DC | PRN

## 2013-10-05 NOTE — Telephone Encounter (Signed)
Patient notified and voiced understanding.

## 2013-10-05 NOTE — Telephone Encounter (Signed)
Meds ordered this encounter  Medications  . butorphanol (STADOL) 10 MG/ML nasal spray    Sig: Place 1 spray into the nose every 4 (four) hours as needed.    Dispense:  2.5 mL    Refill:  3

## 2013-10-26 ENCOUNTER — Telehealth: Payer: Self-pay

## 2013-10-26 DIAGNOSIS — G43909 Migraine, unspecified, not intractable, without status migrainosus: Secondary | ICD-10-CM

## 2013-10-26 MED ORDER — BUTORPHANOL TARTRATE 10 MG/ML NA SOLN: 1.0000 | NASAL | Status: DC | PRN

## 2013-10-26 NOTE — Telephone Encounter (Signed)
Patient notified RX ready for p/u

## 2013-10-26 NOTE — Telephone Encounter (Signed)
Patient would like rx for her stadol if possible [phone]

## 2013-10-26 NOTE — Telephone Encounter (Signed)
Patient notified and voiced understanding.

## 2013-11-08 ENCOUNTER — Ambulatory Visit (INDEPENDENT_AMBULATORY_CARE_PROVIDER_SITE_OTHER): Payer: 59 | Admitting: Internal Medicine

## 2013-11-08 VITALS — BP 152/82 | HR 79 | Temp 98.9°F | Resp 18 | Wt 158.0 lb

## 2013-11-08 DIAGNOSIS — E785 Hyperlipidemia, unspecified: Secondary | ICD-10-CM

## 2013-11-08 DIAGNOSIS — G40909 Epilepsy, unspecified, not intractable, without status epilepticus: Secondary | ICD-10-CM

## 2013-11-08 DIAGNOSIS — M25512 Pain in left shoulder: Secondary | ICD-10-CM

## 2013-11-08 DIAGNOSIS — M25519 Pain in unspecified shoulder: Secondary | ICD-10-CM

## 2013-11-08 DIAGNOSIS — E119 Type 2 diabetes mellitus without complications: Secondary | ICD-10-CM

## 2013-11-08 DIAGNOSIS — E039 Hypothyroidism, unspecified: Secondary | ICD-10-CM

## 2013-11-08 DIAGNOSIS — I10 Essential (primary) hypertension: Secondary | ICD-10-CM

## 2013-11-08 NOTE — Addendum Note (Signed)
Addended by: Leandrew Koyanagi on: 11/08/2013 10:46 PM   Modules accepted: Orders

## 2013-11-08 NOTE — Progress Notes (Signed)
   Subjective:   This chart was scribed for Natalie Lin, MD by Forrestine Him, [facility] Scribe. This patient was seen in room 5 and the patient's care was started 5:29 PM.    Patient ID: Natalie [name], female    DOB: 1951-07-30, 63 y.o.   MRN: [id number]  HPI  HPI Comments: Natalie [name] is a 63 y.o. Female with a PMHx of HTN, hyperlipidemia, and DM who presents to [facility] requesting a letter for medicare and life insurance today. Pt states she is due to officially qualify next month. She states she attempted to change her life insurance recently, but was denied due to currently being on 2 blood medications. Pt is needing verification that she is not on these medications for hypertension but instead for protection of renal function with diabetes, past tachycardias, and only using one medication for mild hypertension. In addition, she states protocol requires reasoning for each prescription she is currently on.   Pt also reports ongoing pain to her left shoulder onset 2-3 months. She says certain movement worsens her discomfort. Pt states she plans to follow up next month for this concern. Past history of rotator cuff repair on this left shoulder. She notes less range of motion with less use.  Review of Systems  Constitutional: Negative for fever and chills.    Past Medical History  Diagnosis Date  . Hyperlipidemia   . Hypertension   . Diabetes mellitus without complication     Past Surgical History  Procedure Laterality Date  . Cholecystectomy    . Cesarean section      Objective:  Physical Exam  Nursing note and vitals reviewed. Constitutional: She is oriented to person, place, and time. She appears well-developed and well-nourished.  HENT:  Head: Normocephalic and atraumatic.  Eyes: EOM are normal.  Neck: Normal range of motion.  Cardiovascular: Normal rate.   Pulmonary/Chest: Effort normal.  Musculoskeletal:  Left shoulder with  pain on abduction to 90 and with abduction against resistance as well as anterior elevation against resistance. She has mild pain with external rotation but none with adduction  Neurological: She is alert and oriented to person, place, and time.  Skin: Skin is warm and dry.  Psychiatric: She has a normal mood and affect. Her behavior is normal.   Assessment & Plan:  I have completed the patient encounter in its entirety as documented by the scribe, with editing by me where necessary. Witney Huie P. Laney Pastor, M.D.  Pain, joint, shoulder region, left--- we will refer to physical therapy   HTN (hypertension)  DM (diabetes mellitus)  Hyperlipemia  Hypothyroidism  Seizure disorder  Letter for insurance company  Meds no medications prescribed today  Has appointment in one month at 104

## 2013-11-18 ENCOUNTER — Telehealth: Payer: Self-pay

## 2013-11-18 DIAGNOSIS — G43909 Migraine, unspecified, not intractable, without status migrainosus: Secondary | ICD-10-CM

## 2013-11-18 NOTE — Telephone Encounter (Signed)
PT STATES SHE IS IN NEED OF HER STADOL. PLEASE CALL [phone] WHEN READY FOR PICK UP

## 2013-11-19 NOTE — Telephone Encounter (Signed)
Pt CB and clarified that she gets the Rx filled weekly and therefore has filled her last RF.

## 2013-11-19 NOTE — Telephone Encounter (Signed)
LMOM for pt to CB. She should have RFs on Stadol from 10/26/13 RF.

## 2013-11-20 MED ORDER — BUTORPHANOL TARTRATE 10 MG/ML NA SOLN: 1.0000 | NASAL | Status: DC | PRN

## 2013-11-20 NOTE — Telephone Encounter (Signed)
Meds ordered this encounter  Medications  . butorphanol (STADOL) 10 MG/ML nasal spray    Sig: Place 1 spray into the nose every 4 (four) hours as needed.    Dispense:  2.5 mL    Refill:  3

## 2013-11-30 DIAGNOSIS — M25519 Pain in unspecified shoulder: Secondary | ICD-10-CM | POA: Diagnosis not present

## 2013-12-04 DIAGNOSIS — M25519 Pain in unspecified shoulder: Secondary | ICD-10-CM | POA: Diagnosis not present

## 2013-12-08 DIAGNOSIS — M25519 Pain in unspecified shoulder: Secondary | ICD-10-CM | POA: Diagnosis not present

## 2013-12-11 ENCOUNTER — Telehealth: Payer: Self-pay

## 2013-12-11 DIAGNOSIS — G43909 Migraine, unspecified, not intractable, without status migrainosus: Secondary | ICD-10-CM

## 2013-12-11 DIAGNOSIS — M25519 Pain in unspecified shoulder: Secondary | ICD-10-CM | POA: Diagnosis not present

## 2013-12-11 MED ORDER — BUTORPHANOL TARTRATE 10 MG/ML NA SOLN: 1.0000 | NASAL | Status: DC | PRN

## 2013-12-11 NOTE — Telephone Encounter (Signed)
Pt is requesting a rx refill on Stadol. Best# [phone]

## 2013-12-11 NOTE — Telephone Encounter (Signed)
Pt has a CPE 3/24 with Dr. Laney Pastor Pt has a migraine about 3x a week.  She needs a refill to hold her over until that appt. She does not have a headache today but is without and worried she may surprised by one.  I have pended medication. She wants to pick up this evening/tomorrow am

## 2013-12-14 NOTE — Telephone Encounter (Signed)
Notified pt Rx ready for p/up 

## 2013-12-15 ENCOUNTER — Encounter: Payer: Self-pay | Admitting: Internal Medicine

## 2013-12-15 DIAGNOSIS — M25519 Pain in unspecified shoulder: Secondary | ICD-10-CM | POA: Diagnosis not present

## 2013-12-23 ENCOUNTER — Encounter: Payer: Self-pay | Admitting: Internal Medicine

## 2013-12-23 ENCOUNTER — Ambulatory Visit (INDEPENDENT_AMBULATORY_CARE_PROVIDER_SITE_OTHER): Payer: Medicare Other | Admitting: Internal Medicine

## 2013-12-23 ENCOUNTER — Other Ambulatory Visit: Payer: Self-pay | Admitting: Internal Medicine

## 2013-12-23 VITALS — BP 162/79 | HR 69 | Temp 98.0°F | Resp 16 | Ht <= 58 in | Wt 157.0 lb

## 2013-12-23 DIAGNOSIS — N390 Urinary tract infection, site not specified: Secondary | ICD-10-CM

## 2013-12-23 DIAGNOSIS — I10 Essential (primary) hypertension: Secondary | ICD-10-CM | POA: Diagnosis not present

## 2013-12-23 DIAGNOSIS — Z139 Encounter for screening, unspecified: Secondary | ICD-10-CM | POA: Diagnosis not present

## 2013-12-23 DIAGNOSIS — G43909 Migraine, unspecified, not intractable, without status migrainosus: Secondary | ICD-10-CM

## 2013-12-23 DIAGNOSIS — Z0000 Encounter for general adult medical examination without abnormal findings: Secondary | ICD-10-CM | POA: Diagnosis not present

## 2013-12-23 DIAGNOSIS — Z1239 Encounter for other screening for malignant neoplasm of breast: Secondary | ICD-10-CM

## 2013-12-23 DIAGNOSIS — E039 Hypothyroidism, unspecified: Secondary | ICD-10-CM

## 2013-12-23 DIAGNOSIS — G4700 Insomnia, unspecified: Secondary | ICD-10-CM | POA: Diagnosis not present

## 2013-12-23 DIAGNOSIS — Z23 Encounter for immunization: Secondary | ICD-10-CM | POA: Diagnosis not present

## 2013-12-23 DIAGNOSIS — M25512 Pain in left shoulder: Secondary | ICD-10-CM

## 2013-12-23 DIAGNOSIS — Z01419 Encounter for gynecological examination (general) (routine) without abnormal findings: Secondary | ICD-10-CM | POA: Diagnosis not present

## 2013-12-23 DIAGNOSIS — G40909 Epilepsy, unspecified, not intractable, without status epilepticus: Secondary | ICD-10-CM

## 2013-12-23 DIAGNOSIS — R809 Proteinuria, unspecified: Secondary | ICD-10-CM

## 2013-12-23 DIAGNOSIS — E785 Hyperlipidemia, unspecified: Secondary | ICD-10-CM

## 2013-12-23 DIAGNOSIS — Z6833 Body mass index (BMI) 33.0-33.9, adult: Secondary | ICD-10-CM

## 2013-12-23 DIAGNOSIS — Z1231 Encounter for screening mammogram for malignant neoplasm of breast: Secondary | ICD-10-CM

## 2013-12-23 DIAGNOSIS — E119 Type 2 diabetes mellitus without complications: Secondary | ICD-10-CM

## 2013-12-23 DIAGNOSIS — E1129 Type 2 diabetes mellitus with other diabetic kidney complication: Secondary | ICD-10-CM

## 2013-12-23 DIAGNOSIS — G8929 Other chronic pain: Secondary | ICD-10-CM

## 2013-12-23 LAB — POCT UA - MICROSCOPIC ONLY
CASTS, UR, LPF, POC: NEGATIVE
CRYSTALS, UR, HPF, POC: NEGATIVE
Mucus, UA: NEGATIVE
YEAST UA: NEGATIVE

## 2013-12-23 LAB — COMPREHENSIVE METABOLIC PANEL
ALBUMIN: 4.1 g/dL (ref 3.5–5.2)
ALK PHOS: 78 U/L (ref 39–117)
ALT: 12 U/L (ref 0–35)
AST: 13 U/L (ref 0–37)
BUN: 21 mg/dL (ref 6–23)
CALCIUM: 9.6 mg/dL (ref 8.4–10.5)
CHLORIDE: 101 meq/L (ref 96–112)
CO2: 27 mEq/L (ref 19–32)
Creat: 0.78 mg/dL (ref 0.50–1.10)
Glucose, Bld: 149 mg/dL — ABNORMAL HIGH (ref 70–99)
POTASSIUM: 5.3 meq/L (ref 3.5–5.3)
SODIUM: 135 meq/L (ref 135–145)
TOTAL PROTEIN: 7 g/dL (ref 6.0–8.3)
Total Bilirubin: 0.3 mg/dL (ref 0.2–1.2)

## 2013-12-23 LAB — CBC WITH DIFFERENTIAL/PLATELET
BASOS PCT: 0 % (ref 0–1)
Basophils Absolute: 0 10*3/uL (ref 0.0–0.1)
EOS ABS: 0.1 10*3/uL (ref 0.0–0.7)
Eosinophils Relative: 2 % (ref 0–5)
HCT: 38.1 % (ref 36.0–46.0)
Hemoglobin: 12.8 g/dL (ref 12.0–15.0)
Lymphocytes Relative: 27 % (ref 12–46)
Lymphs Abs: 1.6 10*3/uL (ref 0.7–4.0)
MCH: 30.3 pg (ref 26.0–34.0)
MCHC: 33.6 g/dL (ref 30.0–36.0)
MCV: 90.1 fL (ref 78.0–100.0)
MONO ABS: 0.5 10*3/uL (ref 0.1–1.0)
Monocytes Relative: 9 % (ref 3–12)
NEUTROS ABS: 3.7 10*3/uL (ref 1.7–7.7)
Neutrophils Relative %: 62 % (ref 43–77)
Platelets: 341 10*3/uL (ref 150–400)
RBC: 4.23 MIL/uL (ref 3.87–5.11)
RDW: 15.5 % (ref 11.5–15.5)
WBC: 5.9 10*3/uL (ref 4.0–10.5)

## 2013-12-23 LAB — POCT URINALYSIS DIPSTICK
BILIRUBIN UA: NEGATIVE
Glucose, UA: NEGATIVE
KETONES UA: NEGATIVE
Nitrite, UA: NEGATIVE
PROTEIN UA: 100
SPEC GRAV UA: 1.025
Urobilinogen, UA: 0.2
pH, UA: 5.5

## 2013-12-23 LAB — T4, FREE: Free T4: 1.18 ng/dL (ref 0.80–1.80)

## 2013-12-23 LAB — LIPID PANEL
Cholesterol: 212 mg/dL — ABNORMAL HIGH (ref 0–200)
HDL: 57 mg/dL (ref >39)
LDL Cholesterol: 124 mg/dL — ABNORMAL HIGH (ref 0–99)
Total CHOL/HDL Ratio: 3.7 Ratio
Triglycerides: 156 mg/dL — ABNORMAL HIGH (ref <150)
VLDL: 31 mg/dL (ref 0–40)

## 2013-12-23 LAB — TSH: TSH: 1.243 u[IU]/mL (ref 0.350–4.500)

## 2013-12-23 LAB — POCT GLYCOSYLATED HEMOGLOBIN (HGB A1C): HEMOGLOBIN A1C: 7.5

## 2013-12-23 LAB — IFOBT (OCCULT BLOOD): IMMUNOLOGICAL FECAL OCCULT BLOOD TEST: NEGATIVE

## 2013-12-23 MED ORDER — ZOLPIDEM TARTRATE 10 MG PO TABS: ORAL_TABLET | ORAL | Status: DC

## 2013-12-23 MED ORDER — BUTORPHANOL TARTRATE 10 MG/ML NA SOLN: 1.0000 | NASAL | Status: DC | PRN

## 2013-12-23 MED ORDER — PHENOBARBITAL 97.2 MG PO TABS: ORAL_TABLET | ORAL | Status: DC

## 2013-12-23 MED ORDER — ONDANSETRON 8 MG PO TBDP: ORAL_TABLET | ORAL | Status: DC

## 2013-12-23 NOTE — Progress Notes (Signed)
   Subjective:    Patient ID: Natalie [name], female    DOB: 10-04-1950, 63 y.o.   MRN: [id number]  HPI annual physical examination with welcome to medicare component Also medication refills  Needs tdap Patient Active Problem List   Diagnosis Date Noted  . BMI 33.0-33.9,adult--  Wt Readings from Last 3 Encounters:  12/23/13 157 lb (71.215 kg)  11/08/13 158 lb (71.668 kg)  09/09/13 155 lb 9.6 oz (70.58 kg)   03/30/2013  . Dermatofibroma resolved  12/03/2012  . Recurrent UTI--- no recent problems  09/17/2012  . DM (diabetes mellitus)--- home blood sugars relatively okay /no neuropathy symptoms  03/19/2012  . HTN (hypertension)--- home blood pressures okay  03/19/2012  . Hyperlipemia 03/19/2012  . Hypothyroidism 03/19/2012  . Seizure disorder--completely stable for many years Natalie [name] is extremely reluctant to discontinue the small dose of phenobarbital because she has had seizures when she has tried  03/19/2012  . Insomnia--responding  03/19/2012  . Migraine--- fairly intractable to remove any years but controlled with intermittent use of Stadol which is preventing the need for emergency room visits and has been successful for several years /protocol as established by neurology at [facility] many years ago  03/19/2012   No recent visit for shoulder pain. She presents with a note from her physical therapist and there has been no improvement. She continues with left shoulder pain with activity. Range of motion is limited by pain.  No risk for falls/no problems accomplishing activities/depression screen negative Family doing well-granddaughter about 2 start driving Nonsmoker Past Surgical History  Procedure Laterality Date  . Cholecystectomy    . Cesarean section      Review of Systems 14 systems reviewed on pink sheet and there are no positives outside of the present illness    Objective:   Physical Exam BP 162/79  Pulse 69  Temp(Src) 98 F (36.7 C)  Resp 16  Ht 4' 8.5" (1.435  m)  Wt 157 lb (71.215 kg)  BMI 34.58 kg/m2  SpO2 97% No acute distress HEENT is clear No adenopathy or thyromegaly Breasts are without masses Heart regular without murmur/no carotid bruits Lungs clear Abdomen without organomegaly or bruits Pelvic= introitus clear  Vaginal vault clear with small cervix  Uterus mid position  No adnexal masses Rectal= no masses and negative Hemoccult Left shoulder with decreased range of motion due to pain-painful arc-pain with resisted AB duction  Decreased external rotation  Tender in bicipital groove and over the anterior shoulder Extremities with no edema/good peripheral pulses/sensation intact plantar Neurological intact Mood good and affect appropriate/thought content normal     Assessment & Plan:  Annual physical examination  Need for Tdap vaccination - Plan: Tdap vaccine greater than or equal to 7yo IM Screening for breast cancer - Plan: MM Digital Screening Colonoscopy referral to follow at next visit EKG next year  BMI 33.0-33.9,adult  DM (diabetes mellitus) - Plan: POCT glycosylated hemoglobin (Hb A1C), Microalbumin, urine  HTN (hypertension) - Plan: CBC with Differential, Comprehensive metabolic panel  Hyperlipemia - Plan: Lipid panel  Hypothyroidism - Plan: TSH, T4, free  Insomnia--no change  Migraine - Plan: butorphanol (STADOL) 10 MG/ML nasal spray  Recurrent UTI - Plan: POCT urinalysis dipstick, POCT UA - Microscopic Only  Seizure disorder - Plan: PHENobarbital (LUMINAL) 97.2 MG tablet  Medicare annual wellness visit, initial --see documentation scanned   Chronic shoulder pain on the left not responding to physical therapy-will refer to Dr. Tamera Punt

## 2013-12-24 LAB — PAP IG W/ RFLX HPV ASCU

## 2013-12-24 LAB — MICROALBUMIN, URINE: Microalb, Ur: 70.91 mg/dL — ABNORMAL HIGH (ref 0.00–1.89)

## 2013-12-28 ENCOUNTER — Encounter: Payer: Self-pay | Admitting: Internal Medicine

## 2013-12-28 DIAGNOSIS — E1129 Type 2 diabetes mellitus with other diabetic kidney complication: Secondary | ICD-10-CM | POA: Insufficient documentation

## 2013-12-28 DIAGNOSIS — R809 Proteinuria, unspecified: Secondary | ICD-10-CM

## 2013-12-30 DIAGNOSIS — M67919 Unspecified disorder of synovium and tendon, unspecified shoulder: Secondary | ICD-10-CM | POA: Diagnosis not present

## 2013-12-31 LAB — HUMAN PAPILLOMAVIRUS, HIGH RISK: HPV DNA HIGH RISK: NOT DETECTED

## 2014-01-04 ENCOUNTER — Encounter: Payer: Self-pay | Admitting: Internal Medicine

## 2014-01-20 DIAGNOSIS — M719 Bursopathy, unspecified: Secondary | ICD-10-CM | POA: Diagnosis not present

## 2014-01-20 DIAGNOSIS — M67919 Unspecified disorder of synovium and tendon, unspecified shoulder: Secondary | ICD-10-CM | POA: Diagnosis not present

## 2014-01-26 ENCOUNTER — Telehealth: Payer: Self-pay

## 2014-01-26 DIAGNOSIS — G43909 Migraine, unspecified, not intractable, without status migrainosus: Secondary | ICD-10-CM

## 2014-01-26 MED ORDER — BUTORPHANOL TARTRATE 10 MG/ML NA SOLN: 1.0000 | NASAL | Status: DC | PRN

## 2014-01-26 NOTE — Telephone Encounter (Signed)
Ok Note from insurance says we may need preauth to continue under medicare

## 2014-01-26 NOTE — Telephone Encounter (Signed)
PT IN NEED OF HER STADOL 10MG S. PLEASE CALL [phone] WHEN READY FOR PICK UP

## 2014-01-26 NOTE — Telephone Encounter (Signed)
Meds ordered this encounter  Medications  . butorphanol (STADOL) 10 MG/ML nasal spray    Sig: Place 1 spray into the nose every 4 (four) hours as needed.    Dispense:  2.5 mL    Refill:  2

## 2014-01-26 NOTE — Telephone Encounter (Signed)
Can we send in more refills than 3 or do 2 prescriptions with refills for patient? She would like to pick up script around 5pm. She uses one bottle a week.  Patient needs script printed. I have pended.

## 2014-01-27 NOTE — Telephone Encounter (Signed)
Notified pt on VM that Rx is ready for p/up. I will do PA if/when Rx is rejected by ins and pharm sends me notice that one is needed.

## 2014-02-06 DIAGNOSIS — M19019 Primary osteoarthritis, unspecified shoulder: Secondary | ICD-10-CM | POA: Diagnosis not present

## 2014-02-10 ENCOUNTER — Other Ambulatory Visit: Payer: Self-pay | Admitting: Radiology

## 2014-02-10 DIAGNOSIS — G43909 Migraine, unspecified, not intractable, without status migrainosus: Secondary | ICD-10-CM

## 2014-02-10 DIAGNOSIS — S4350XA Sprain of unspecified acromioclavicular joint, initial encounter: Secondary | ICD-10-CM | POA: Diagnosis not present

## 2014-02-10 MED ORDER — BUTORPHANOL TARTRATE 10 MG/ML NA SOLN: 1.0000 | NASAL | Status: DC | PRN

## 2014-02-10 NOTE — Telephone Encounter (Signed)
Patient requests refill of Stadol, pended please advise

## 2014-02-11 NOTE — Telephone Encounter (Signed)
Notified pt ready. 

## 2014-02-11 NOTE — Telephone Encounter (Signed)
This was taken to 102 by Dr Laney Pastor yesterday.

## 2014-02-19 ENCOUNTER — Telehealth: Payer: Self-pay

## 2014-02-19 ENCOUNTER — Ambulatory Visit (INDEPENDENT_AMBULATORY_CARE_PROVIDER_SITE_OTHER): Payer: Medicare Other | Admitting: Internal Medicine

## 2014-02-19 VITALS — BP 140/82 | HR 82 | Temp 98.2°F | Resp 16 | Ht <= 58 in | Wt 160.0 lb

## 2014-02-19 DIAGNOSIS — G43909 Migraine, unspecified, not intractable, without status migrainosus: Secondary | ICD-10-CM

## 2014-02-19 MED ORDER — BUTORPHANOL TARTRATE 10 MG/ML NA SOLN: 1.0000 | NASAL | Status: DC | PRN

## 2014-02-19 NOTE — Telephone Encounter (Signed)
PA needed for Stadol. Dr Laney Pastor gave me h/o prior meds tried : hydrocodone, oxycodone, demerol, methadone, all triptans, topamax, ibuprofen and other NSAIDS. This regimen was established by Neurologists/HA specialists at [facility] and he is continuing regimen that has been effective for 10 years. Completed PA on covermymeds.

## 2014-02-19 NOTE — Progress Notes (Signed)
Subjective:    Patient ID: Natalie [name], female    DOB: 10/14/1950, 63 y.o.   MRN: [id number] This chart was scribed for [name], MD by Anastasia Pall, ED Scribe. This patient was seen in room 08 and the patient's care was started at 12:49 PM.  Chief Complaint  Patient presents with   Medication Management   HPI Natalie [name] is a 63 y.o. female Pt presents for medication management for Stadol. She has paperwork with her. She states she has all the other medications she is on taken care of. She reports having blood work done 03/25. HAs stable for now.  She reports having MRI performed on her shoulder, had another Cortisone injection, and is feeling improved. She reports recent trauma to her right great toe with associated bruising. She denies recent urinary symptoms.   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 Authorizing Provider  atorvastatin (LIPITOR) 20 MG tablet Take 1 tablet (20 mg total) by mouth daily. 09/09/13  Yes Leandrew Koyanagi, MD  butorphanol (STADOL) 10 MG/ML nasal spray Place 1 spray into the nose every 4 (four) hours as needed. 02/10/14  Yes Leandrew Koyanagi, MD  levothyroxine (SYNTHROID, LEVOTHROID) 75 MCG tablet Take 1 tablet (75 mcg total) by mouth daily. 09/09/13  Yes Leandrew Koyanagi, MD  lisinopril (PRINIVIL,ZESTRIL) 20 MG tablet Take 1 tablet (20 mg total) by mouth daily. 09/09/13  Yes Leandrew Koyanagi, MD  metFORMIN (GLUCOPHAGE) 1000 MG tablet Take 1 tablet  (1,000 mg total) by mouth daily with breakfast. 09/14/13  Yes Leandrew Koyanagi, MD  metoprolol (LOPRESSOR) 100 MG tablet Take 1 tablet (100 mg total) by mouth 2 (two) times daily. 09/09/13  Yes Leandrew Koyanagi, MD  ondansetron (ZOFRAN-ODT) 8 MG disintegrating tablet DISSOLVE 1 TABLET UNDER TONGUE EVERY 8 HOURS AS NEEDED FOR NAUSEA 12/23/13  Yes Leandrew Koyanagi, MD  PHENobarbital (LUMINAL) 97.2 MG tablet Take 2 tabs daily on Mon, Wed, and Fri. 1 daily on other days. 12/23/13  Yes Leandrew Koyanagi, MD  zolpidem (AMBIEN) 10 MG tablet take 1 tablet by mouth at bedtime if needed 12/23/13  Yes Leandrew Koyanagi, MD   Review of Systems  Genitourinary: Negative.   Musculoskeletal: Positive for arthralgias (right foot/toe).  Skin: Positive for color change (right foot bruising). Negative for wound.      Objective:   Physical Exam  Nursing note and vitals reviewed. Constitutional: She is oriented to person, place, and time. She appears well-developed and well-nourished. No distress.  HENT:  Head: Normocephalic and atraumatic.  Eyes: EOM are normal.  Neck: Neck supple.  Cardiovascular: Normal rate.   Pulmonary/Chest: Effort normal. No respiratory distress.  Musculoskeletal: Normal range of motion.  Neurological: She is alert and oriented to person, place, and time.  Skin: Skin is warm and dry.  Right great toe with bruising.   Psychiatric: She has a normal mood and affect. Her behavior is normal.   BP 140/82   Pulse 82   Temp(Src) 98.2 F (  36.8 C) (Oral)   Resp 16   Ht 4\' 8"  (1.422 m)   Wt 160 lb (72.576 kg)   BMI 35.89 kg/m2   SpO2 98%     Assessment & Plan:   Migraine - Plan: butorphanol (STADOL) 10 MG/ML nasal spray  Will get preauthor. Contusion toe  F/u 07/2014 for labs    I have completed the patient encounter in its entirety as documented by the scribe, with editing by me where necessary. Robert P. Laney Pastor, M.D.

## 2014-02-24 NOTE — Telephone Encounter (Signed)
PA approved through 02/20/15. Notified pt. She asked whether they approved 1 bottle per week and I advised her that this is the quantity I had put on the PA form, so hopefully they have quantity correct.

## 2014-03-04 ENCOUNTER — Telehealth: Payer: Self-pay | Admitting: Family Medicine

## 2014-03-04 DIAGNOSIS — G43909 Migraine, unspecified, not intractable, without status migrainosus: Secondary | ICD-10-CM

## 2014-03-04 MED ORDER — BUTORPHANOL TARTRATE 10 MG/ML NA SOLN: 1.0000 | NASAL | Status: DC | PRN

## 2014-03-04 NOTE — Telephone Encounter (Signed)
Pt came into 102 and reported that insurance did NOT approve dose of 1 bottle (2.5 ml) per week as I had indicated when completing the PA form. She also reported that the Rx is actually written incorrectly also when it says "this represents a 3 mos supply". With the 5 add'l RFs on Rx it only covers her for 6 weeks. Dr Laney Pastor, can you please correct the Rx and I will call ins to try to straighten out the PA for it?

## 2014-03-04 NOTE — Telephone Encounter (Signed)
See other message also about this. I notified pt I will let her know when PA has been approved and Dr Laney Pastor can get her the corrected Rx when he returns since she has enough to last her.

## 2014-03-04 NOTE — Addendum Note (Signed)
Addended by: Leandrew Koyanagi on: 03/04/2014 12:19 PM   Modules accepted: Orders

## 2014-03-04 NOTE — Telephone Encounter (Signed)
Notified pt we have a script for 1 bottle, but pt stated that since she still has RFs on the prev one, she does not need this. She will just wait for corrected one that has the 2.5 bottle w/3RFs that represents a 1 month supply in total when Dr Laney Pastor gets back. I have shredded the script written by Nira Conn. I am still waiting to hear back on PA for this correct dosing.

## 2014-03-04 NOTE — Telephone Encounter (Signed)
Rx for #1 bottle to last her until Dr. Ninfa Meeker return. Will forward to him for review

## 2014-03-04 NOTE — Telephone Encounter (Signed)
This represents a one-month supply/cannot refill more than 5 times undercurrent wall so we will do this at monthly intervals rather than every 6 weeks. 3 month supply apparently not allowed at least not by the computer

## 2014-03-04 NOTE — Telephone Encounter (Signed)
Spoke with Georgiann Mccoy PA-C about rewriting RX for Stadol during Dr. Ninfa Meeker absence. He wrote RX 03/04/2014 but did not sign. She will write new RX for 1 bottle. Dr. Laney Pastor will need to write new RX with additional refills upon his return. His prescription was shredded and will contact patient for pickup of new rx.

## 2014-03-15 NOTE — Telephone Encounter (Signed)
Pt is still waiting on a pre-approval from the insurance company; would like to speak with Pamala Hurry

## 2014-03-16 NOTE — Telephone Encounter (Signed)
Dr Laney Pastor, can you please write pt's corrected Rx for a month's interval of 2.5 mls PER WEEK for pt as you specified below?  I have called ins to check on PA. After holding for over 20 mins, I was advised the supervisor is looking into it and they will call me back. Pt aware of status and I have printed her the covermymed form resubmitted 03/04/14 for her reference when calling ins herself.

## 2014-03-16 NOTE — Telephone Encounter (Signed)
See notes under 02/19/14 phone message.

## 2014-03-17 MED ORDER — BUTORPHANOL TARTRATE 10 MG/ML NA SOLN: 1.0000 | NASAL | Status: DC | PRN

## 2014-03-17 NOTE — Addendum Note (Signed)
Addended by: Leandrew Koyanagi on: 03/17/2014 02:17 PM   Modules accepted: Orders

## 2014-03-18 NOTE — Telephone Encounter (Signed)
PA was approved for pt to be covered for 10.80 ml/mos which is 2.51ml weekly through 02/20/15. Pt notified and was very thankful. Pt has also p/up corrected Rx so nothing more is needed at this time.

## 2014-03-24 ENCOUNTER — Other Ambulatory Visit: Payer: Self-pay | Admitting: Physician Assistant

## 2014-03-26 DIAGNOSIS — M19019 Primary osteoarthritis, unspecified shoulder: Secondary | ICD-10-CM | POA: Diagnosis not present

## 2014-04-06 DIAGNOSIS — M25819 Other specified joint disorders, unspecified shoulder: Secondary | ICD-10-CM | POA: Diagnosis not present

## 2014-04-06 DIAGNOSIS — G8918 Other acute postprocedural pain: Secondary | ICD-10-CM | POA: Diagnosis not present

## 2014-04-06 DIAGNOSIS — M19019 Primary osteoarthritis, unspecified shoulder: Secondary | ICD-10-CM | POA: Diagnosis not present

## 2014-04-09 ENCOUNTER — Other Ambulatory Visit: Payer: Self-pay | Admitting: Internal Medicine

## 2014-04-09 NOTE — Telephone Encounter (Signed)
Refill on Stadol   [phone]

## 2014-04-09 NOTE — Telephone Encounter (Signed)
i will do this next Tuesday---pend the order for me so I won't forget

## 2014-04-09 NOTE — Telephone Encounter (Signed)
LMOM that it would be ready on Tues. Med pended.

## 2014-04-12 MED ORDER — BUTORPHANOL TARTRATE 10 MG/ML NA SOLN: 1.0000 | NASAL | Status: DC | PRN

## 2014-04-13 NOTE — Telephone Encounter (Signed)
Pt notified that this is up front for her to p/u

## 2014-04-14 DIAGNOSIS — M19019 Primary osteoarthritis, unspecified shoulder: Secondary | ICD-10-CM | POA: Diagnosis not present

## 2014-05-19 ENCOUNTER — Telehealth: Payer: Self-pay

## 2014-05-19 NOTE — Telephone Encounter (Signed)
Completed form on covermymeds. Pending.

## 2014-05-19 NOTE — Telephone Encounter (Signed)
Pt came into 102 and advised her Ambien needs a PA done. [employer] # [id number], ID # [id number], ph # [phone] if needed. Pt reported that she tried several things in the past for insomnia, but has been using Ambien for several years w/good effectiveness. Pt was initially started on Ambien CR 12.5 on 12/10/2005, changed to Canones 10mg  for a short trial 03/04/2006, and then put on Ambien 10 mg 05/05/2006, which worked the best for pt. Pt has been taking zolpidem for many years w/out adverse SEs.

## 2014-05-20 NOTE — Telephone Encounter (Signed)
PA approved through 05/20/15. Pt notified.

## 2014-05-28 ENCOUNTER — Ambulatory Visit (INDEPENDENT_AMBULATORY_CARE_PROVIDER_SITE_OTHER): Payer: Medicare Other | Admitting: Internal Medicine

## 2014-05-28 ENCOUNTER — Other Ambulatory Visit: Payer: Self-pay | Admitting: Internal Medicine

## 2014-05-28 VITALS — BP 120/76 | HR 72 | Temp 98.0°F | Resp 17 | Ht <= 58 in | Wt 162.0 lb

## 2014-05-28 DIAGNOSIS — G4700 Insomnia, unspecified: Secondary | ICD-10-CM

## 2014-05-28 DIAGNOSIS — E785 Hyperlipidemia, unspecified: Secondary | ICD-10-CM

## 2014-05-28 DIAGNOSIS — E1129 Type 2 diabetes mellitus with other diabetic kidney complication: Secondary | ICD-10-CM | POA: Diagnosis not present

## 2014-05-28 DIAGNOSIS — I10 Essential (primary) hypertension: Secondary | ICD-10-CM

## 2014-05-28 DIAGNOSIS — G43719 Chronic migraine without aura, intractable, without status migrainosus: Secondary | ICD-10-CM

## 2014-05-28 DIAGNOSIS — Z6833 Body mass index (BMI) 33.0-33.9, adult: Secondary | ICD-10-CM | POA: Diagnosis not present

## 2014-05-28 DIAGNOSIS — E038 Other specified hypothyroidism: Secondary | ICD-10-CM

## 2014-05-28 LAB — GLUCOSE, POCT (MANUAL RESULT ENTRY): POC GLUCOSE: 165 mg/dL — AB (ref 70–99)

## 2014-05-28 LAB — POCT GLYCOSYLATED HEMOGLOBIN (HGB A1C): Hemoglobin A1C: 7.5

## 2014-05-28 MED ORDER — BUTORPHANOL TARTRATE 10 MG/ML NA SOLN: 1.0000 | NASAL | Status: DC | PRN

## 2014-05-28 MED ORDER — ZOLPIDEM TARTRATE 10 MG PO TABS: ORAL_TABLET | ORAL | Status: DC

## 2014-05-29 NOTE — Progress Notes (Signed)
Subjective:    Patient ID: Natalie [name], female    DOB: [birth year], 63 y.o.   MRN: [id number]  HPI  Chief Complaint  Patient presents with  . Medication Refill    Stadol,ambien   Patient Active Problem List   Diagnosis Date Noted  . 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 BMI 33.0-33.9,adult---now at 34.4 03/30/2013  . Dermatofibroma 12/03/2012  . Recurrent UTI---none recent 09/17/2012  . DM (diabetes mellitus) 03/19/2012  . HTN (hypertension)--asympto 03/19/2012  . Hyperlipemia--no side eff 03/19/2012  . Hypothyroidism---no dose chg recent 03/19/2012  . Seizure disorder--stable 03/19/2012  . Insomnia--resp well to [facility] 03/19/2012  . Migraine---lives by use of stadol as started at [facility] by Dr Collene Mares many yrs ago 03/19/2012   Prior to Admission medications   Medication Sig Start Date End Date Taking? Authorizing Provider  atorvastatin (LIPITOR) 20 MG tablet Take 1 tablet (20 mg total) by mouth daily. 09/09/13  Yes Leandrew Koyanagi, MD  butorphanol (STADOL) 10 MG/ML nasal spray Place 1 spray into the nose every 4 (four) hours as needed for headache. May refill weekly 05/28/14  Yes Leandrew Koyanagi, MD  levothyroxine (SYNTHROID, LEVOTHROID) 75 MCG tablet Take 1 tablet (75 mcg total) by mouth daily. 09/09/13  Yes Leandrew Koyanagi, MD  lisinopril (PRINIVIL,ZESTRIL) 20 MG tablet Take 1 tablet (20 mg total) by mouth daily. 09/09/13  Yes Leandrew Koyanagi, MD  metFORMIN (GLUCOPHAGE) 1000 MG tablet Take 1 tablet (1,000 mg total) by mouth daily with breakfast. 09/14/13  Yes Leandrew Koyanagi, MD  metoprolol (LOPRESSOR) 100 MG tablet Take 1 tablet (100 mg total) by mouth 2 (two) times daily. 09/09/13  Yes Leandrew Koyanagi, MD  ondansetron (ZOFRAN-ODT) 8 MG disintegrating tablet DISSOLVE 1 TABLET UNDER TONGUE EVERY 8 HOURS AS NEEDED FOR NAUSEA 12/23/13  Yes Leandrew Koyanagi, MD  ondansetron (ZOFRAN-ODT) 8 MG disintegrating  tablet dissolve 1 tablet under the tongue every 8 hours if needed for nausea 03/24/14  Yes Leandrew Koyanagi, MD  PHENobarbital (LUMINAL) 97.2 MG tablet Take 2 tabs daily on Mon, Wed, and Fri. 1 daily on other days. 12/23/13  Yes Leandrew Koyanagi, MD  zolpidem (AMBIEN) 10 MG tablet take 1 tablet by mouth at bedtime if needed   Yes Leandrew Koyanagi, MD  butorphanol (STADOL) 10 MG/ML nasal spray Place 1 spray into the nose every 4 (four) hours as needed for headache. For 6 weeks from date signed    Leandrew Koyanagi, MD    Granddaughter entering high sch!!   Review of Systems  Constitutional: Negative for fever, activity change, fatigue and unexpected weight change.  HENT: Negative for trouble swallowing.   Eyes: Negative for visual disturbance.  Respiratory: Negative for cough and shortness of breath.   Cardiovascular: Negative for chest pain and palpitations.  Gastrointestinal: Negative for abdominal pain.  Endocrine: Negative for cold intolerance.  Genitourinary: Negative for difficulty urinating.  Musculoskeletal: Negative for back pain.  Neurological: Negative for dizziness, speech difficulty and weakness.  Hematological: Does not bruise/bleed easily.  Psychiatric/Behavioral: Negative for confusion, dysphoric mood and decreased concentration.       Objective:   Physical Exam  Constitutional: She is oriented to person, place, and time. She appears well-developed and well-nourished. No distress.  HENT:  Mouth/Throat: Oropharynx is clear and moist.  Eyes: Conjunctivae and EOM are normal. Pupils are equal, round, and reactive to light.  Neck: Neck supple. No thyromegaly present.  Cardiovascular: Normal rate,  regular rhythm, normal heart sounds and intact distal pulses.   Pulmonary/Chest: Effort normal.  Musculoskeletal: She exhibits no edema.  Neurological: She is alert and oriented to person, place, and time. No cranial nerve deficit.  sens intact feet  Psychiatric: She has a  normal mood and affect. Her behavior is normal.    Results for orders placed in visit on 05/28/14  POCT GLYCOSYLATED HEMOGLOBIN (HGB A1C)      Result Value Ref Range   Hemoglobin A1C 7.5    GLUCOSE, POCT (MANUAL RESULT ENTRY)      Result Value Ref Range   POC Glucose 165 (*) 70 - 99 mg/dl         Assessment & Plan:  Intractable chronic migraine without aura and without status migrainosus  Type 2 diabetes mellitus with other diabetic kidney complication - Plan: POCT glycosylated hemoglobin (Hb A1C), POCT glucose (manual entry)  Hyperlipemia  BMI 33.0-33.9,adult  Essential hypertension  Other specified hypothyroidism  Insomnia  Meds ordered this encounter  Medications  . zolpidem (AMBIEN) 10 MG tablet    Sig: take 1 tablet by mouth at bedtime if needed    Dispense:  30 tablet    Refill:  5  . butorphanol (STADOL) 10 MG/ML nasal spray    Sig: Place 1 spray into the nose every 4 (four) hours as needed for headache. May refill weekly    Dispense:  2.5 mL    Refill:  5    This equals a 3 month supply  . butorphanol (STADOL) 10 MG/ML nasal spray    Sig: Place 1 spray into the nose every 4 (four) hours as needed for headache. For 6 weeks from date signed    Dispense:  2.5 mL    Refill:  5   F/u 3 mos Disc wt loss rather than incr metf for next 3-6 months

## 2014-06-29 ENCOUNTER — Telehealth: Payer: Self-pay

## 2014-06-29 MED ORDER — BUTORPHANOL TARTRATE 10 MG/ML NA SOLN: 1.0000 | NASAL | Status: DC | PRN

## 2014-06-29 NOTE — Telephone Encounter (Signed)
Please refill her Stadol   Best phone for pt is [phone]

## 2014-06-29 NOTE — Telephone Encounter (Signed)
This script did not print. Please advise.

## 2014-06-29 NOTE — Telephone Encounter (Signed)
Pt advised refill sent to pharmacy.

## 2014-06-30 NOTE — Telephone Encounter (Signed)
Check w tl this am

## 2014-06-30 NOTE — Telephone Encounter (Signed)
Prescription located.  Pt notified ready for pick up. In pick up drawer.

## 2014-07-25 DIAGNOSIS — Z23 Encounter for immunization: Secondary | ICD-10-CM | POA: Diagnosis not present

## 2014-08-03 ENCOUNTER — Telehealth: Payer: Self-pay

## 2014-08-03 NOTE — Telephone Encounter (Signed)
Pt of Dr. Laney Pastor requesting refill on butorphanol (STADOL) 10 MG/ML nasal spray [id number], Please advise pt when this has been processed.

## 2014-08-05 MED ORDER — BUTORPHANOL TARTRATE 10 MG/ML NA SOLN: 1.0000 | NASAL | Status: DC | PRN

## 2014-08-05 NOTE — Telephone Encounter (Signed)
Patient called again to see if her script is ready. Please advise   [phone]

## 2014-08-05 NOTE — Telephone Encounter (Signed)
Meds ordered this encounter  Medications  . butorphanol (STADOL) 10 MG/ML nasal spray    Sig: Place 1 spray into the nose every 4 (four) hours as needed for headache. May refill weekly    Dispense:  2.5 mL    Refill:  5

## 2014-08-06 MED ORDER — BUTORPHANOL TARTRATE 10 MG/ML NA SOLN: 1.0000 | NASAL | Status: DC | PRN

## 2014-08-06 NOTE — Addendum Note (Signed)
Addended by: Elwyn Reach A on: 08/06/2014 12:10 PM   Modules accepted: Orders

## 2014-08-06 NOTE — Telephone Encounter (Signed)
Pt came into check status of Rx. Reprinted in [place] name since Dr Laney Pastor is not in office today. Rx given to pt in office.

## 2014-08-06 NOTE — Telephone Encounter (Signed)
Dr. Laney Pastor, if your box to be sign

## 2014-08-18 ENCOUNTER — Other Ambulatory Visit: Payer: Self-pay | Admitting: Internal Medicine

## 2014-08-24 NOTE — Telephone Encounter (Signed)
Faxed

## 2014-08-31 ENCOUNTER — Emergency Department (HOSPITAL_COMMUNITY): Payer: Medicare Other

## 2014-08-31 ENCOUNTER — Encounter (HOSPITAL_COMMUNITY): Payer: Self-pay

## 2014-08-31 ENCOUNTER — Inpatient Hospital Stay (HOSPITAL_COMMUNITY)
Admission: EM | Admit: 2014-08-31 | Discharge: 2014-09-04 | DRG: 872 | Disposition: A | Discharge status: Home / Self Care | Payer: Medicare Other | Attending: Family Medicine | Admitting: Family Medicine

## 2014-08-31 DIAGNOSIS — E785 Hyperlipidemia, unspecified: Secondary | ICD-10-CM | POA: Diagnosis present

## 2014-08-31 DIAGNOSIS — I10 Essential (primary) hypertension: Secondary | ICD-10-CM | POA: Diagnosis present

## 2014-08-31 DIAGNOSIS — Z9049 Acquired absence of other specified parts of digestive tract: Secondary | ICD-10-CM | POA: Diagnosis present

## 2014-08-31 DIAGNOSIS — R42 Dizziness and giddiness: Secondary | ICD-10-CM | POA: Diagnosis not present

## 2014-08-31 DIAGNOSIS — I4581 Long QT syndrome: Secondary | ICD-10-CM | POA: Diagnosis present

## 2014-08-31 DIAGNOSIS — R404 Transient alteration of awareness: Secondary | ICD-10-CM | POA: Diagnosis not present

## 2014-08-31 DIAGNOSIS — Z87440 Personal history of urinary (tract) infections: Secondary | ICD-10-CM | POA: Diagnosis not present

## 2014-08-31 DIAGNOSIS — A419 Sepsis, unspecified organism: Principal | ICD-10-CM | POA: Diagnosis present

## 2014-08-31 DIAGNOSIS — G43909 Migraine, unspecified, not intractable, without status migrainosus: Secondary | ICD-10-CM | POA: Diagnosis present

## 2014-08-31 DIAGNOSIS — R140 Abdominal distension (gaseous): Secondary | ICD-10-CM | POA: Diagnosis not present

## 2014-08-31 DIAGNOSIS — D696 Thrombocytopenia, unspecified: Secondary | ICD-10-CM | POA: Diagnosis present

## 2014-08-31 DIAGNOSIS — J9811 Atelectasis: Secondary | ICD-10-CM | POA: Diagnosis not present

## 2014-08-31 DIAGNOSIS — Z888 Allergy status to other drugs, medicaments and biological substances status: Secondary | ICD-10-CM | POA: Diagnosis not present

## 2014-08-31 DIAGNOSIS — R109 Unspecified abdominal pain: Secondary | ICD-10-CM | POA: Insufficient documentation

## 2014-08-31 DIAGNOSIS — Z79899 Other long term (current) drug therapy: Secondary | ICD-10-CM | POA: Diagnosis not present

## 2014-08-31 DIAGNOSIS — E119 Type 2 diabetes mellitus without complications: Secondary | ICD-10-CM | POA: Diagnosis present

## 2014-08-31 DIAGNOSIS — R531 Weakness: Secondary | ICD-10-CM | POA: Diagnosis not present

## 2014-08-31 DIAGNOSIS — G40909 Epilepsy, unspecified, not intractable, without status epilepticus: Secondary | ICD-10-CM | POA: Diagnosis present

## 2014-08-31 DIAGNOSIS — E039 Hypothyroidism, unspecified: Secondary | ICD-10-CM | POA: Diagnosis present

## 2014-08-31 DIAGNOSIS — R55 Syncope and collapse: Secondary | ICD-10-CM | POA: Diagnosis not present

## 2014-08-31 DIAGNOSIS — N390 Urinary tract infection, site not specified: Secondary | ICD-10-CM

## 2014-08-31 DIAGNOSIS — N179 Acute kidney failure, unspecified: Secondary | ICD-10-CM | POA: Diagnosis present

## 2014-08-31 DIAGNOSIS — N12 Tubulo-interstitial nephritis, not specified as acute or chronic: Secondary | ICD-10-CM | POA: Diagnosis present

## 2014-08-31 DIAGNOSIS — E860 Dehydration: Secondary | ICD-10-CM | POA: Diagnosis present

## 2014-08-31 DIAGNOSIS — R6520 Severe sepsis without septic shock: Secondary | ICD-10-CM | POA: Diagnosis not present

## 2014-08-31 LAB — CBC WITH DIFFERENTIAL/PLATELET
Basophils Absolute: 0 10*3/uL (ref 0.0–0.1)
Basophils Relative: 0 % (ref 0–1)
Eosinophils Absolute: 0 10*3/uL (ref 0.0–0.7)
Eosinophils Relative: 0 % (ref 0–5)
HEMATOCRIT: 35.8 % — AB (ref 36.0–46.0)
Hemoglobin: 12 g/dL (ref 12.0–15.0)
Lymphocytes Relative: 5 % — ABNORMAL LOW (ref 12–46)
Lymphs Abs: 0.6 10*3/uL — ABNORMAL LOW (ref 0.7–4.0)
MCH: 31.1 pg (ref 26.0–34.0)
MCHC: 33.5 g/dL (ref 30.0–36.0)
MCV: 92.7 fL (ref 78.0–100.0)
MONO ABS: 1.3 10*3/uL — AB (ref 0.1–1.0)
MONOS PCT: 12 % (ref 3–12)
NEUTROS ABS: 8.8 10*3/uL — AB (ref 1.7–7.7)
NEUTROS PCT: 83 % — AB (ref 43–77)
Platelets: 151 10*3/uL (ref 150–400)
RBC: 3.86 MIL/uL — AB (ref 3.87–5.11)
RDW: 13.6 % (ref 11.5–15.5)
WBC: 10.7 10*3/uL — ABNORMAL HIGH (ref 4.0–10.5)

## 2014-08-31 LAB — COMPREHENSIVE METABOLIC PANEL
ALK PHOS: 79 U/L (ref 39–117)
ALT: 20 U/L (ref 0–35)
ANION GAP: 21 — AB (ref 5–15)
AST: 36 U/L (ref 0–37)
Albumin: 3.2 g/dL — ABNORMAL LOW (ref 3.5–5.2)
BILIRUBIN TOTAL: 0.3 mg/dL (ref 0.3–1.2)
BUN: 49 mg/dL — AB (ref 6–23)
CHLORIDE: 94 meq/L — AB (ref 96–112)
CO2: 17 mEq/L — ABNORMAL LOW (ref 19–32)
Calcium: 8.9 mg/dL (ref 8.4–10.5)
Creatinine, Ser: 2.43 mg/dL — ABNORMAL HIGH (ref 0.50–1.10)
GFR calc Af Amer: 23 mL/min — ABNORMAL LOW (ref >90)
GFR, EST NON AFRICAN AMERICAN: 20 mL/min — AB (ref >90)
GLUCOSE: 260 mg/dL — AB (ref 70–99)
POTASSIUM: 4 meq/L (ref 3.7–5.3)
Sodium: 132 mEq/L — ABNORMAL LOW (ref 137–147)
Total Protein: 7.2 g/dL (ref 6.0–8.3)

## 2014-08-31 LAB — I-STAT CG4 LACTIC ACID, ED
Lactic Acid, Venous: 2.23 mmol/L — ABNORMAL HIGH (ref 0.5–2.2)
Lactic Acid, Venous: 1.49 mmol/L (ref 0.5–2.2)

## 2014-08-31 LAB — URINALYSIS, ROUTINE W REFLEX MICROSCOPIC
BILIRUBIN URINE: NEGATIVE
Glucose, UA: NEGATIVE mg/dL
KETONES UR: 15 mg/dL — AB
Nitrite: NEGATIVE
PROTEIN: 100 mg/dL — AB
Specific Gravity, Urine: 1.02 (ref 1.005–1.030)
UROBILINOGEN UA: 0.2 mg/dL (ref 0.0–1.0)
pH: 5 (ref 5.0–8.0)

## 2014-08-31 LAB — URINE MICROSCOPIC-ADD ON

## 2014-08-31 LAB — GLUCOSE, CAPILLARY: Glucose-Capillary: 197 mg/dL — ABNORMAL HIGH (ref 70–99)

## 2014-08-31 MED ORDER — ONDANSETRON HCL 4 MG PO TABS: 4.0000 mg | ORAL_TABLET | Freq: Four times a day (QID) | ORAL | Status: DC | PRN

## 2014-08-31 MED ORDER — SODIUM CHLORIDE 0.9 % IV BOLUS (SEPSIS)
1000.0000 mL | INTRAVENOUS | Status: AC
  Administered 2014-08-31 (×2): 1000 mL via INTRAVENOUS

## 2014-08-31 MED ORDER — KCL IN DEXTROSE-NACL 20-5-0.45 MEQ/L-%-% IV SOLN
INTRAVENOUS | Status: DC
  Administered 2014-09-01 (×2): via INTRAVENOUS
  Filled 2014-08-31 (×5): qty 1000

## 2014-08-31 MED ORDER — ACETAMINOPHEN 325 MG PO TABS
650.0000 mg | ORAL_TABLET | Freq: Once | ORAL | Status: AC
  Administered 2014-08-31: 650 mg via ORAL
  Filled 2014-08-31: qty 2

## 2014-08-31 MED ORDER — CEFTRIAXONE SODIUM 1 G IJ SOLR
1.0000 g | Freq: Once | INTRAMUSCULAR | Status: AC
  Administered 2014-08-31: 1 g via INTRAVENOUS
  Filled 2014-08-31: qty 10

## 2014-08-31 MED ORDER — PHENOBARBITAL 32.4 MG PO TABS
97.2000 mg | ORAL_TABLET | Freq: Once | ORAL | Status: AC
  Administered 2014-08-31: 97.2 mg via ORAL
  Filled 2014-08-31: qty 3

## 2014-08-31 MED ORDER — SODIUM CHLORIDE 0.9 % IJ SOLN
3.0000 mL | Freq: Two times a day (BID) | INTRAMUSCULAR | Status: DC
  Administered 2014-09-01 – 2014-09-03 (×3): 3 mL via INTRAVENOUS

## 2014-08-31 MED ORDER — LORAZEPAM 2 MG/ML IJ SOLN
0.5000 mg | Freq: Once | INTRAMUSCULAR | Status: AC
  Administered 2014-08-31: 0.5 mg via INTRAVENOUS
  Filled 2014-08-31: qty 1

## 2014-08-31 MED ORDER — ONDANSETRON HCL 4 MG/2ML IJ SOLN: 4.0000 mg | Freq: Four times a day (QID) | INTRAMUSCULAR | Status: DC | PRN

## 2014-08-31 MED ORDER — ACETAMINOPHEN 650 MG RE SUPP: 650.0000 mg | Freq: Once | RECTAL | Status: DC

## 2014-08-31 MED ORDER — ENOXAPARIN SODIUM 30 MG/0.3ML ~~LOC~~ SOLN
30.0000 mg | SUBCUTANEOUS | Status: DC
  Administered 2014-09-01 – 2014-09-02 (×2): 30 mg via SUBCUTANEOUS
  Filled 2014-08-31 (×2): qty 0.3

## 2014-08-31 MED ORDER — INSULIN ASPART 100 UNIT/ML ~~LOC~~ SOLN
0.0000 [IU] | Freq: Three times a day (TID) | SUBCUTANEOUS | Status: DC
  Administered 2014-09-01: 3 [IU] via SUBCUTANEOUS
  Administered 2014-09-01: 5 [IU] via SUBCUTANEOUS
  Administered 2014-09-01: 3 [IU] via SUBCUTANEOUS
  Administered 2014-09-02 – 2014-09-03 (×2): 2 [IU] via SUBCUTANEOUS
  Administered 2014-09-03 (×2): 3 [IU] via SUBCUTANEOUS
  Administered 2014-09-04: 5 [IU] via SUBCUTANEOUS

## 2014-08-31 MED ORDER — MORPHINE SULFATE 2 MG/ML IJ SOLN
1.0000 mg | INTRAMUSCULAR | Status: DC | PRN
  Administered 2014-09-01 – 2014-09-03 (×5): 1 mg via INTRAVENOUS
  Filled 2014-08-31 (×5): qty 1

## 2014-08-31 MED ORDER — SODIUM CHLORIDE 0.9 % IV BOLUS (SEPSIS)
500.0000 mL | INTRAVENOUS | Status: AC
  Administered 2014-08-31: 500 mL via INTRAVENOUS

## 2014-08-31 MED ORDER — PHENOBARBITAL 32.4 MG PO TABS
194.4000 mg | ORAL_TABLET | ORAL | Status: DC
  Administered 2014-09-01 – 2014-09-03 (×2): 194.4 mg via ORAL
  Filled 2014-08-31 (×2): qty 6

## 2014-08-31 MED ORDER — CEFTRIAXONE SODIUM IN DEXTROSE 20 MG/ML IV SOLN
1.0000 g | INTRAVENOUS | Status: DC
  Filled 2014-08-31: qty 50

## 2014-08-31 NOTE — ED Provider Notes (Signed)
CSN: [id number]     Arrival date & time 08/31/14  1804 History   First MD Initiated Contact with Patient 08/31/14 1828     Chief Complaint  Patient presents with  . Weakness     Patient is a 63 y.o. female presenting with weakness. The history is provided by the patient and a relative.  Weakness This is a new problem. The current episode started more than 2 days ago. The problem occurs daily. The problem has been gradually worsening. Associated symptoms include headaches. Pertinent negatives include no chest pain and no abdominal pain. Nothing aggravates the symptoms. Nothing relieves the symptoms. She has tried rest for the symptoms. The treatment provided no relief.  Patient presents from home for generalized weakness for over 3 days Per family,pt has been very weak and unable to get out of bed Today, she "rolled out of bed" and was unable to get back up into bed on her own.  No injuries reported She has also had intermittent confusion all day today Pt reports HA and feeling fatigued She has also had vomiting as well.    Past Medical History  Diagnosis Date  . Hyperlipidemia   . Hypertension   . Diabetes mellitus without complication    Past Surgical History  Procedure Laterality Date  . Cholecystectomy    . Cesarean section     Family History  Problem Relation Age of Onset  . Heart disease Father   . Diabetes Father    History  Substance Use Topics  . Smoking status: Never Smoker   . Smokeless tobacco: Not on file  . Alcohol Use: No   OB History    No data available     Review of Systems  Constitutional: Positive for fever, chills and fatigue.  Cardiovascular: Negative for chest pain.  Gastrointestinal: Positive for vomiting. Negative for abdominal pain.  Musculoskeletal: Negative for neck pain.  Neurological: Positive for weakness and headaches. Negative for seizures.  Psychiatric/Behavioral: Positive for confusion.  All other systems reviewed and are  negative.     Allergies  Compazine; Cymbalta; Escitalopram oxalate; Methadone; and Nitrofuran derivatives  Home Medications   Prior to Admission medications   Medication Sig Start Date End Date Taking? Authorizing Provider  atorvastatin (LIPITOR) 20 MG tablet Take 1 tablet (20 mg total) by mouth daily. 09/09/13  Yes Leandrew Koyanagi, MD  butorphanol (STADOL) 10 MG/ML nasal spray Place 1 spray into the nose every 4 (four) hours as needed for headache. May refill weekly 08/06/14  Yes Mancel Bale, PA-C  levothyroxine (SYNTHROID, LEVOTHROID) 75 MCG tablet Take 1 tablet (75 mcg total) by mouth daily. 09/09/13  Yes Leandrew Koyanagi, MD  lisinopril (PRINIVIL,ZESTRIL) 20 MG tablet Take 1 tablet (20 mg total) by mouth daily. 09/09/13  Yes Leandrew Koyanagi, MD  metFORMIN (GLUCOPHAGE) 1000 MG tablet Take 1 tablet (1,000 mg total) by mouth daily with breakfast. 09/14/13  Yes Leandrew Koyanagi, MD  metoprolol (LOPRESSOR) 100 MG tablet Take 1 tablet (100 mg total) by mouth 2 (two) times daily. 09/09/13  Yes Leandrew Koyanagi, MD  ondansetron (ZOFRAN-ODT) 8 MG disintegrating tablet dissolve 1 tablet under the tongue every 8 hours if needed for nausea 03/24/14  Yes Leandrew Koyanagi, MD  PHENobarbital (LUMINAL) 97.2 MG tablet Take 2 tabs daily on Mon, Wed, and Fri. 1 daily on other days. Patient taking differently: Take 97.2-194.4 mg by mouth See admin instructions. Take 2 tabs daily on Mon, Wed, and Fri. 1 daily  on other days. 12/23/13  Yes Leandrew Koyanagi, MD  zolpidem (AMBIEN) 10 MG tablet take 1 tablet by mouth at bedtime for sleep if needed 08/18/14  Yes Leandrew Koyanagi, MD  ondansetron (ZOFRAN-ODT) 8 MG disintegrating tablet DISSOLVE 1 TABLET UNDER TONGUE EVERY 8 HOURS AS NEEDED FOR NAUSEA Patient not taking: Reported on 08/31/2014 12/23/13   Leandrew Koyanagi, MD   BP 137/66 mmHg  Pulse 126  Temp(Src) 100.5 F (38.1 C) (Oral)  Resp 24  SpO2 94% Physical Exam CONSTITUTIONAL:  elderly, frail HEAD: Normocephalic/atraumatic EYES: EOMI/PERRL ENMT: Mucous membranes moist NECK: supple no meningeal signs SPINE/BACK:no cervical spine tenderness.  No bruising/crepitance/stepoffs noted to spine CV: S1/S2 noted, no murmurs/rubs/gallops noted LUNGS: Lungs are clear to auscultation bilaterally, mild tachypnea noted ABDOMEN: soft, nontender, no rebound or guarding, bowel sounds noted throughout abdomen GU:no cva tenderness NEURO: Pt is awake/alert/appropriate, moves all extremitiesx4.    EXTREMITIES: pulses normal/equal, full ROM, All extremities/joints palpated/ranged and nontender SKIN: warm, color normal PSYCH: no abnormalities of mood noted  ED Course  Procedures  CRITICAL CARE Performed by: Sharyon Cable Total critical care time: 31 Critical care time was exclusive of separately billable procedures and treating other patients. Critical care was necessary to treat or prevent imminent or life-threatening deterioration. Critical care was time spent personally by me on the following activities: development of treatment plan with patient and/or surrogate as well as nursing, discussions with consultants, evaluation of patient's response to treatment, examination of patient, obtaining history from patient or surrogate, ordering and performing treatments and interventions, ordering and review of laboratory studies, ordering and review of radiographic studies, pulse oximetry and re-evaluation of patient's condition. PATIENT WITH SEPSIS - FEVER/TACHYCARDIA.  SHE HAS A UTI AND REQUIRED IV RESUSCITATION AS WELL AS IV ANTIBIOTICS  Labs Review Labs Reviewed  CBC WITH DIFFERENTIAL - Abnormal; Notable for the following:    WBC 10.7 (*)    RBC 3.86 (*)    HCT 35.8 (*)    Neutrophils Relative % 83 (*)    Neutro Abs 8.8 (*)    Lymphocytes Relative 5 (*)    Lymphs Abs 0.6 (*)    Monocytes Absolute 1.3 (*)    All other components within normal limits  COMPREHENSIVE METABOLIC  PANEL - Abnormal; Notable for the following:    Sodium 132 (*)    Chloride 94 (*)    CO2 17 (*)    Glucose, Bld 260 (*)    BUN 49 (*)    Creatinine, Ser 2.43 (*)    Albumin 3.2 (*)    GFR calc non Af Amer 20 (*)    GFR calc Af Amer 23 (*)    Anion gap 21 (*)    All other components within normal limits  URINALYSIS, ROUTINE W REFLEX MICROSCOPIC - Abnormal; Notable for the following:    APPearance CLOUDY (*)    Hgb urine dipstick LARGE (*)    Ketones, ur 15 (*)    Protein, ur 100 (*)    Leukocytes, UA MODERATE (*)    All other components within normal limits  URINE MICROSCOPIC-ADD ON - Abnormal; Notable for the following:    Bacteria, UA MANY (*)    All other components within normal limits  I-STAT CG4 LACTIC ACID, ED - Abnormal; Notable for the following:    Lactic Acid, Venous 2.23 (*)    All other components within normal limits  CULTURE, BLOOD (ROUTINE X 2)  CULTURE, BLOOD (ROUTINE X 2)  URINE CULTURE  I-STAT CG4 LACTIC ACID, ED    Imaging Review Ct Head Wo Contrast  08/31/2014   CLINICAL DATA:  Weakness and dizziness for 3 days, near syncopal episode, vomiting  EXAM: CT HEAD WITHOUT CONTRAST  TECHNIQUE: Contiguous axial images were obtained from the base of the skull through the vertex without contrast.  COMPARISON:  None  FINDINGS: Diffuse brain atrophy with prominence of the anterior CSF spaces. Chronic white matter microvascular changes throughout the cerebral hemispheres. No acute intracranial hemorrhage, mass lesion, definite acute infarction, midline shift, herniation, or hydrocephalus. Remote basal ganglia lacunar-type infarcts noted. Cisterns patent. No cerebellar abnormality. Orbits are symmetric. Mastoids and sinuses clear.  IMPRESSION: Diffuse brain atrophy, most pronounced in the frontal lobes.  Chronic white matter microvascular change.  No acute finding by noncontrast CT   Electronically Signed   By: Daryll Brod M.D.   On: 08/31/2014 20:54   Dg Chest Port 1  View  08/31/2014   CLINICAL DATA:  Weakness, diabetes, upper abdominal pain  EXAM: PORTABLE CHEST - 1 VIEW  COMPARISON:  11/23/2011  FINDINGS: Low lung volumes noted with basilar atelectasis and vascular crowding. This is worse in the right lower lobe. No edema, effusion or pneumothorax. Stable right paratracheal soft tissue prominence, suspect vascular. Degenerative changes of the AC joints.  IMPRESSION: Low volume exam with vascular crowding basilar atelectasis.   Electronically Signed   By: Daryll Brod M.D.   On: 08/31/2014 19:59     EKG Interpretation   Date/Time:  Tuesday August 31 2014 18:12:07 EST Ventricular Rate:  127 PR Interval:  105 QRS Duration: 83 QT Interval:  377 QTC Calculation: 548 R Axis:   -28 Text Interpretation:  Sinus tachycardia Ventricular premature complex  Borderline left axis deviation Low voltage, extremity leads Nonspecific T  abnormalities, lateral leads Prolonged QT interval changes noted when  compared to prior (rate is faster) Confirmed by [place]  [phone]) on 08/31/2014 7:18:33 PM     Medications  sodium chloride 0.9 % bolus 1,000 mL (0 mLs Intravenous Stopped 08/31/14 2149)    Followed by  sodium chloride 0.9 % bolus 500 mL (0 mLs Intravenous Stopped 08/31/14 2130)  acetaminophen (TYLENOL) tablet 650 mg (650 mg Oral Given 08/31/14 1930)  LORazepam (ATIVAN) injection 0.5 mg (0.5 mg Intravenous Given 08/31/14 2126)  PHENobarbital (LUMINAL) tablet 97.2 mg (97.2 mg Oral Given 08/31/14 2139)  cefTRIAXone (ROCEPHIN) 1 g in dextrose 5 % 50 mL IVPB (0 g Intravenous Stopped 08/31/14 2239)    MDM  7:16 PM Pt with generalized weakness/fatigue for 3 days, with worsening confusion today She is awake/alert but febrile/tachycardic Concern for possible sepsis Will follow closely Due to fall today with HA, will obtain CT head  11:23 PM PT IMPROVED UTI/SEPSIS NOTED VITALS IMPROVED D/W FAMILY MEDICINE WILL ADMIT PT FELT AS THOUGH SHE MAY HAVE  SEIZURE AS SHE HAS H/O SEIZURES (MISSED PHENOBARB DOSE) GIVEN ATIVAN HERE PT AWAKE/ALERT AT THIS TIME  Final diagnoses:  Weakness  UTI (lower urinary tract infection)  Sepsis, due to unspecified organism  AKI (acute kidney injury)  Dehydration    Nursing notes including past medical history and social history reviewed and considered in documentation xrays/imaging reviewed by myself and considered during evaluation Labs/vital reviewed myself and considered during evaluation     Sharyon Cable, MD 08/31/14 2325

## 2014-08-31 NOTE — ED Notes (Addendum)
[facility]- Pt coming from home, has been weak X3 days. She has had decreased appetite, 2 episodes of vomiting. Pt positive for orthostatic changes with EMS. Pt had a near syncopal episode today when trying to get out of the bed. Pt alert and oriented. Pt has had flu shot.

## 2014-08-31 NOTE — Progress Notes (Signed)
Called ER RN for report. Room ready.  

## 2014-08-31 NOTE — H&P (Signed)
[facility] Admission History and Physical Service Pager: [phone]  Patient name: Natalie [name] Medical record number: [id number] Date of birth: 16-Mar-1951 Age: 63 y.o. Gender: female  Primary Care Provider: [name], MD Consultants: none Code Status: full  Chief Complaint: Generalized weakness and myalgias  Assessment and Plan: Natalie [name] is a 63 y.o. female presenting with pyelonephritis and AKI. PMH is significant for DM 2, HTN, HLD, seizure disorder, and migraines.   # Pyelonephritis: UA consistent with UTI; CVA tenderness and fevers. Tachycardia and tachypnea improved in ED with IVF. BP stable. Lactic acid 1.49 - Admit to tele; Attending Dr Nori Riis - CTX given in ED - Morphine given for pain; no nausea med given prn due to prolonged QTc (548) - CBC in am  # AKI: Cr 2.43 on admit (12/1); Baseline Cr 0.78 (3/'15) Likely due to Pyelo and dehydration. Denies taking ACE-I or NSAIDs since being sick. - IVF as below; s/p 1.5L bolus in Ed - Recheck BMET in am  # Generalized Weakness and Myalgia: Likely due to Pyelo and AKI. No lateralizing signs on exam.CT head negative for acute pathology. Positive orthostatic on admit.  - Reassess in am after IVF and abx - Consider PT/OT eval if not improving   # Seizure disorder: Began as teenager. No seizure activity > 10 years. Unable to take Phenobarbital since sick.  - Continue Phenobarbital - Seizure precaution  #Hypothyroidism:  TSH wnl 11/2013 - IV synthroid while nauseous   # DM:  A1c 7.5 (05/2014) - Holding metformin due to elevated Cr; May need to continue to hold at discharge pending AKI resolution - SSI  # Cardiac: HTN, HLD. EKG sinus tachy with prolonged QTc 548 - BP stable off meds: continue to reassess after rehydration - Holding ACE-I; Continue Lipitor - monitoring with tele  FEN/GI:  - MIVF: D5 1/2 NS + 20K @ 125 - Carb mod diet Prophylaxis: Lovenox   Disposition: Admit to telemetry;  attending Dr. Nori Riis; disposition pending improved PO intake and and treatment of UTI and AKI.   History of Present Illness: Natalie [name] is a 63 y.o. female presenting with weakness, decreased appetite, vomiting 3 days, leading to presyncopal episode today while trying to get out of bed.  Also, increased confusion noted by husband.  She reports diffuse weakness and myalgias.  Reports fever of 102  Two days ago and complains of right-sided back pain today.  She denies dysuria, frequency, or urgency but reports history of UTI with similar symptoms.  Denies chest pain, shortness of breath, cough or viral URI symptoms.  Received flu shot this year.  Admits to eating and drinking very little over the past 2 days, and has not taken most medications due to this , including blood pressure medications, seizure medications.  Denies NSAID use.   Review Of Systems: Per HPI with the following additions:  Otherwise 12 point review of systems was performed and was unremarkable.  Patient Active Problem List   Diagnosis Date Noted  . UTI (lower urinary tract infection) 08/31/2014  . 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 BMI 33.0-33.9,adult 03/30/2013  . Dermatofibroma 12/03/2012  . Recurrent UTI 09/17/2012  . DM (diabetes mellitus) 03/19/2012  . HTN (hypertension) 03/19/2012  . Hyperlipemia 03/19/2012  . Hypothyroidism 03/19/2012  . Seizure disorder 03/19/2012  . Insomnia 03/19/2012  . Migraine 03/19/2012   Past Medical History: Past Medical History  Diagnosis Date  . Hyperlipidemia   .  Hypertension   . Diabetes mellitus without complication    Past Surgical History: Past Surgical History  Procedure Laterality Date  . Cholecystectomy    . Cesarean section     Social History: History  Substance Use Topics  . Smoking status: Never Smoker   . Smokeless tobacco: Not on file  . Alcohol Use: No   Additional social history:   Please also refer to  relevant sections of EMR.  Family History: Family History  Problem Relation Age of Onset  . Heart disease Father   . Diabetes Father    Allergies and Medications: Allergies  Allergen Reactions  . Compazine   . Cymbalta [Duloxetine Hcl]   . Escitalopram Oxalate Other (See Comments)    Can't sleep  . Methadone Hives  . Nitrofuran Derivatives    No current facility-administered medications on file prior to encounter.   Current Outpatient Prescriptions on File Prior to Encounter  Medication Sig Dispense Refill  . atorvastatin (LIPITOR) 20 MG tablet Take 1 tablet (20 mg total) by mouth daily. 90 tablet 3  . butorphanol (STADOL) 10 MG/ML nasal spray Place 1 spray into the nose every 4 (four) hours as needed for headache. May refill weekly 2.5 mL 5  . levothyroxine (SYNTHROID, LEVOTHROID) 75 MCG tablet Take 1 tablet (75 mcg total) by mouth daily. 90 tablet 3  . lisinopril (PRINIVIL,ZESTRIL) 20 MG tablet Take 1 tablet (20 mg total) by mouth daily. 90 tablet 3  . metFORMIN (GLUCOPHAGE) 1000 MG tablet Take 1 tablet (1,000 mg total) by mouth daily with breakfast. 90 tablet 3  . metoprolol (LOPRESSOR) 100 MG tablet Take 1 tablet (100 mg total) by mouth 2 (two) times daily. 180 tablet 3  . ondansetron (ZOFRAN-ODT) 8 MG disintegrating tablet dissolve 1 tablet under the tongue every 8 hours if needed for nausea 20 tablet 4  . PHENobarbital (LUMINAL) 97.2 MG tablet Take 2 tabs daily on Mon, Wed, and Fri. 1 daily on other days. (Patient taking differently: Take 97.2-194.4 mg by mouth See admin instructions. Take 2 tabs daily on Mon, Wed, and Fri. 1 daily on other days.) 135 tablet 3  . zolpidem (AMBIEN) 10 MG tablet take 1 tablet by mouth at bedtime for sleep if needed 30 tablet 5  . ondansetron (ZOFRAN-ODT) 8 MG disintegrating tablet DISSOLVE 1 TABLET UNDER TONGUE EVERY 8 HOURS AS NEEDED FOR NAUSEA (Patient not taking: Reported on 08/31/2014) 20 tablet 3    Objective: BP 126/73 mmHg  Pulse 114   Temp(Src) 97.7 F (36.5 C) (Oral)  Resp 22  Ht 4\' 9"  (1.448 m)  Wt 164 lb 7.4 oz (74.6 kg)  BMI 35.58 kg/m2  SpO2 100% Exam: General: resting in bed; NAD HEENT: MM mildly dry Cardiovascular: mild tachycardia with reg rhythm; no m/r/g Respiratory: CTAB; normal resp effort Abdomen: Mild suprapubic tenderness; Right CVA tenderness Extremities: WWP Neuro: A&Ox4; Short & Long term memory intact; Gross Sensory & Motor intact  Labs and Imaging: CBC BMET   Recent Labs Lab 08/31/14 1853  WBC 10.7*  HGB 12.0  HCT 35.8*  PLT 151    Recent Labs Lab 08/31/14 1853  NA 132*  K 4.0  CL 94*  CO2 17*  BUN 49*  CREATININE 2.43*  GLUCOSE 260*  CALCIUM 8.9    Lactic Acid, Venous: 2.23 >>1.49  Urinalysis    Component Value Date/Time   COLORURINE YELLOW 08/31/2014 2056   APPEARANCEUR CLOUDY* 08/31/2014 2056   LABSPEC 1.020 08/31/2014 2056   PHURINE 5.0 08/31/2014 2056  GLUCOSEU NEGATIVE 08/31/2014 2056   HGBUR LARGE* 08/31/2014 2056   BILIRUBINUR NEGATIVE 08/31/2014 2056   BILIRUBINUR neg 12/23/2013 1212   KETONESUR 15* 08/31/2014 2056   PROTEINUR 100* 08/31/2014 2056   PROTEINUR 100 12/23/2013 1212   UROBILINOGEN 0.2 08/31/2014 2056   UROBILINOGEN 0.2 12/23/2013 1212   NITRITE NEGATIVE 08/31/2014 2056   NITRITE neg 12/23/2013 1212   LEUKOCYTESUR MODERATE* 08/31/2014 2056   CXR 12/1 Low volume exam with vascular crowding basilar atelectasis.  CT head 12/1 IMPRESSION: Diffuse brain atrophy, most pronounced in the frontal lobes. Chronic white matter microvascular change. No acute finding by noncontrast CT  Olam Idler, MD 08/31/2014, 11:52 PM PGY-2, [place] Intern pager: [phone], text pages welcome

## 2014-08-31 NOTE — ED Notes (Signed)
Admitting MD at BS.  

## 2014-09-01 ENCOUNTER — Inpatient Hospital Stay (HOSPITAL_COMMUNITY): Payer: Medicare Other

## 2014-09-01 DIAGNOSIS — R531 Weakness: Secondary | ICD-10-CM

## 2014-09-01 DIAGNOSIS — N12 Tubulo-interstitial nephritis, not specified as acute or chronic: Secondary | ICD-10-CM

## 2014-09-01 DIAGNOSIS — R109 Unspecified abdominal pain: Secondary | ICD-10-CM | POA: Insufficient documentation

## 2014-09-01 DIAGNOSIS — N179 Acute kidney failure, unspecified: Secondary | ICD-10-CM

## 2014-09-01 DIAGNOSIS — R1084 Generalized abdominal pain: Secondary | ICD-10-CM

## 2014-09-01 LAB — BASIC METABOLIC PANEL
Anion gap: 22 — ABNORMAL HIGH (ref 5–15)
Anion gap: 15 (ref 5–15)
BUN: 44 mg/dL — AB (ref 6–23)
BUN: 38 mg/dL — AB (ref 6–23)
CO2: 13 mEq/L — ABNORMAL LOW (ref 19–32)
CO2: 16 mEq/L — ABNORMAL LOW (ref 19–32)
CREATININE: 2.06 mg/dL — AB (ref 0.50–1.10)
CREATININE: 1.82 mg/dL — AB (ref 0.50–1.10)
Calcium: 8.3 mg/dL — ABNORMAL LOW (ref 8.4–10.5)
Calcium: 7.7 mg/dL — ABNORMAL LOW (ref 8.4–10.5)
Chloride: 102 mEq/L (ref 96–112)
Chloride: 105 mEq/L (ref 96–112)
GFR calc Af Amer: 32 mL/min — ABNORMAL LOW (ref >90)
GFR, EST AFRICAN AMERICAN: 28 mL/min — AB (ref >90)
GFR, EST NON AFRICAN AMERICAN: 24 mL/min — AB (ref >90)
GFR, EST NON AFRICAN AMERICAN: 28 mL/min — AB (ref >90)
Glucose, Bld: 301 mg/dL — ABNORMAL HIGH (ref 70–99)
Glucose, Bld: 256 mg/dL — ABNORMAL HIGH (ref 70–99)
POTASSIUM: 3.8 meq/L (ref 3.7–5.3)
Potassium: 3.9 mEq/L (ref 3.7–5.3)
Sodium: 137 mEq/L (ref 137–147)
Sodium: 136 mEq/L — ABNORMAL LOW (ref 137–147)

## 2014-09-01 LAB — CBC
HEMATOCRIT: 33 % — AB (ref 36.0–46.0)
Hemoglobin: 10.9 g/dL — ABNORMAL LOW (ref 12.0–15.0)
MCH: 30.1 pg (ref 26.0–34.0)
MCHC: 33 g/dL (ref 30.0–36.0)
MCV: 91.2 fL (ref 78.0–100.0)
Platelets: 118 10*3/uL — ABNORMAL LOW (ref 150–400)
RBC: 3.62 MIL/uL — ABNORMAL LOW (ref 3.87–5.11)
RDW: 13.7 % (ref 11.5–15.5)
WBC: 6.4 10*3/uL (ref 4.0–10.5)

## 2014-09-01 LAB — GLUCOSE, CAPILLARY
GLUCOSE-CAPILLARY: 282 mg/dL — AB (ref 70–99)
GLUCOSE-CAPILLARY: 231 mg/dL — AB (ref 70–99)
GLUCOSE-CAPILLARY: 242 mg/dL — AB (ref 70–99)
Glucose-Capillary: 230 mg/dL — ABNORMAL HIGH (ref 70–99)

## 2014-09-01 LAB — CLOSTRIDIUM DIFFICILE BY PCR: Toxigenic C. Difficile by PCR: NEGATIVE

## 2014-09-01 LAB — LACTIC ACID, PLASMA
Lactic Acid, Venous: 2.7 mmol/L — ABNORMAL HIGH (ref 0.5–2.2)
Lactic Acid, Venous: 1.5 mmol/L (ref 0.5–2.2)

## 2014-09-01 LAB — PHENOBARBITAL LEVEL: Phenobarbital: 15.4 ug/mL (ref 15.0–40.0)

## 2014-09-01 MED ORDER — PIPERACILLIN-TAZOBACTAM 3.375 G IVPB
3.3750 g | Freq: Three times a day (TID) | INTRAVENOUS | Status: DC
  Administered 2014-09-01 – 2014-09-03 (×6): 3.375 g via INTRAVENOUS
  Filled 2014-09-01 (×7): qty 50

## 2014-09-01 MED ORDER — INSULIN ASPART 100 UNIT/ML ~~LOC~~ SOLN
0.0000 [IU] | Freq: Every day | SUBCUTANEOUS | Status: DC
  Administered 2014-09-01 – 2014-09-03 (×3): 2 [IU] via SUBCUTANEOUS

## 2014-09-01 MED ORDER — PHENOBARBITAL 32.4 MG PO TABS
97.2000 mg | ORAL_TABLET | ORAL | Status: DC
  Administered 2014-09-02 – 2014-09-04 (×2): 97.2 mg via ORAL
  Filled 2014-09-01 (×2): qty 3

## 2014-09-01 MED ORDER — SODIUM CHLORIDE 0.9 % IV SOLN
INTRAVENOUS | Status: DC
  Administered 2014-09-01 – 2014-09-03 (×2): via INTRAVENOUS
  Administered 2014-09-04: 125 mL/h via INTRAVENOUS
  Administered 2014-09-04: 09:00:00 via INTRAVENOUS

## 2014-09-01 MED ORDER — ACETAMINOPHEN 325 MG PO TABS
650.0000 mg | ORAL_TABLET | Freq: Four times a day (QID) | ORAL | Status: DC | PRN
  Administered 2014-09-01 (×3): 650 mg via ORAL
  Filled 2014-09-01 (×3): qty 2

## 2014-09-01 MED ORDER — ONDANSETRON HCL 4 MG/2ML IJ SOLN
4.0000 mg | Freq: Once | INTRAMUSCULAR | Status: AC
  Administered 2014-09-01: 4 mg via INTRAVENOUS
  Filled 2014-09-01: qty 2

## 2014-09-01 NOTE — Progress Notes (Signed)
Family Practice Teaching Service Interval Progress Note  S: Called by nursing at request of patients daughter. They were concerned because the patient was not at her normal level of arousal when they came to see her this evening and that she was not oriented. I spoke with the patient. She stated she had been asleep when they came in. The nurse noted she was easily able to wake her up after the family made her aware of this. Daughter notes waxing and waning orientation today. Patient notes she feels about the same as she did earlier today.   O: BP 114/55 mmHg  Pulse 90  Temp(Src) 97.8 F (36.6 C) (Oral)  Resp 22  Ht 4\' 9"  (1.448 m)  Wt 164 lb 7.4 oz (74.6 kg)  BMI 35.58 kg/m2  SpO2 95% Gen: easily arousable, NAD CV: rrr Pulm: CTAB, normal WOB Abd: soft, mildly tender in LL abdomen, no guarding or rebound, ND Neuro: A&Ox3, CN 2-12 intact, 5/5 strength in bilateral biceps, triceps, grip, quads, hamstrings, plantar and dorsiflexion, 4+ bilateral hip flexion, sensation to light touch intact in bilateral UE and LE  A/P: patient is a 64 yo female with GNR bacteremia and pyelonephritis resulting in sepsis. Her vital signs have improved at this time and she is no longer febrile. Her recent lactic acid was in the normal range and her bicarb was improved from previously. She was alert and oriented on exam and was easily arousable on exam. Her neuro exam is normal making intracranial process unlikely. Review of her CT head revealed diffuse atrophy. This in combination with her current infection are most likely leading to delirium in this 63 yo patient. Overall her clinical picture is improving at this time. Will continue to monitor tonight. Of note patient is diabetic and is on D5 1/2 NS. Will d/c this and change to NS. Also start on clear liquid diet.    Tommi Rumps, MD Family Medicine PGY-3 Service Pager [phone]

## 2014-09-01 NOTE — Progress Notes (Signed)
PT Cancellation Note  Patient Details Name: Natalie [name] MRN: [id number] DOB: 06-01-1951   Cancelled Treatment:    Reason Eval/Treat Not Completed: Medical issues which prohibited therapy. Noted in chart that pt's temp is elevated at 103.2. Will continue to follow and complete PT eval when able.     Rolinda Roan 09/01/2014, 3:18 PM   Rolinda Roan, PT, DPT Acute Rehabilitation Services Pager: [phone]

## 2014-09-01 NOTE — Progress Notes (Signed)
CRITICAL VALUE ALERT  Critical value received:  Positive Blood Cultures    Date of notification:  09/01/14  Time of notification:  1215   Critical value read back: Yes  Nurse who received alert:  Kizzie Ide  MD notified (1st page):  Family Medicine  Time of first page:  32  MD notified (2nd page):  Time of second page:  Responding MD:  Dr. Garwin Brothers  Time MD responded:  1230

## 2014-09-01 NOTE — Progress Notes (Signed)
Paged Family Medicine, resp 28 O2 sat 93% on RA, paged for possible O2 and diet order, CBG  231, does he want to decrease D51/2 @ 125

## 2014-09-01 NOTE — Progress Notes (Signed)
Paged Dr. Garwin Brothers advised pt has positive blood cultures gram negative rods.

## 2014-09-01 NOTE — Progress Notes (Signed)
Patient seen and examined by me @12 :15pm, patient reports that she is feeling much better than previously.  Her daughter in room, reports patient appears less confused than she was yesterday.  Patient reports abdominal pain is improved.  On exam she is alert, with mild shortness of breath. Neck supple.  Lung sounds clear bilaterally, no wheezes or rales COR regular S1S2 ABD Soft, nontender. Audible bowel sounds bilaterally. No focal masses noted. No guarding.   Plan to continue close monitoring, continue current treatment for suspected pyelonephritis with ceftriaxone.  Dalbert Mayotte, MD

## 2014-09-01 NOTE — Progress Notes (Signed)
Family Medicine Teaching Service Daily Progress Note Intern Pager: [phone]  Patient name: Natalie [name] Medical record number: [id number] Date of birth: November 22, 1950 Age: 63 y.o. Gender: female  Primary Care Provider: [name], MD Consultants: None Code Status: Fill  Pt Overview and Major Events to Date:  12/1 - Admitted with Pyelonephritis and AKI  Assessment and Plan: Natalie [name] is a 63 y.o. female presenting with pyelonephritis and AKI. PMH is significant for DM 2, HTN, HLD, seizure disorder, and migraines.   # Pyelonephritis: UA consistent with UTI; CVA tenderness and fevers. Tachycardia and tachypnea improved with IVF. BP stable. - CTX (12/1- ) - IVF: D5 1/2 NS + 20K @ 125cc/hr - Morphine 1mg  q4hrs prn pain - Avoid zofran prn due to prolonged QTc (548) - Blood culture grew GNRs, likely e coli, will wait for speciation, continue CTX for now - f/u repeat lactic acid - Low threshold for broadening abx and obtaining additional imaging if vitals become unstable or if patient worsens clinically.   # AKI: Cr 2.43 on admit (12/1); Baseline Cr 0.78 (3/15). Likely due to Pyelo and dehydration. Denies taking ACE-I or NSAIDs since being sick. - IVF as below - Continue to monitor  # Abdominal Pain. LLQ tenderness on exam. No acute abdomen. Potentially related to vomiting for past several days.  - KUB shows possible mild ileus - Consider CT abdomen if abdominal pain worsens or if patient deteriorates clinically  # Generalized Weakness and Myalgia: Likely due to Pyelo and AKI. No lateralizing signs on exam.CT head negative for acute pathology. Positive orthostatic on admit.  - Continue to monitor for improvement s/p antibiotics and IVF - PT/OT evaluation  # Seizure disorder: Began as teenager. No seizure activity > 10 years.  - Continue Phenobarbital - Therapeutic phenobarbital levels - Seizure precaution - May consider weaning off as outpatient  #Hypothyroidism: TSH wnl  11/2013 - IV synthroid while nauseous   # DM: A1c 7.5 (05/2014) - Holding metformin due to elevated Cr; May need to continue to hold at discharge pending AKI resolution - SSI  # Cardiac: HTN, HLD. EKG sinus tachy with prolonged QTc 548 - BP stable off meds: continue to reassess after rehydration - Holding ACE-I; Continue Lipitor - monitoring with tele  FEN/GI:  - IVF: D5 1/2 NS + 20K @ 125 - Carb mod diet Prophylaxis: Lovenox   Disposition: Admitted to telemetry pending above.  Subjective:  More abdominal pain this morning. Still with significant nausea.   Objective: Temp:  [97.7 F (36.5 C)-100.6 F (38.1 C)] 100.6 F (38.1 C) (12/02 0747) Pulse Rate:  [98-128] 125 (12/02 0747) Resp:  [19-30] 24 (12/02 0747) BP: (104-143)/(50-78) 139/78 mmHg (12/02 0747) SpO2:  [93 %-100 %] 95 % (12/02 0747) Weight:  [156 lb (70.761 kg)-164 lb 7.4 oz (74.6 kg)] 164 lb 7.4 oz (74.6 kg) (12/01 2350) Physical Exam: General: resting in bed; Ill appearing in NAD Cardiovascular: mild tachycardia with reg rhythm; no m/r/g Respiratory: CTAB; normal resp effort Abdomen: +BS, soft, nondistended. Tender to palpation in LLQ. No guarding or rebound.  Extremities: WWP Neuro: Gross Sensory & Motor intact  Laboratory:  Recent Labs Lab 08/31/14 1853 09/01/14 0718  WBC 10.7* 6.4  HGB 12.0 10.9*  HCT 35.8* 33.0*  PLT 151 PENDING    Recent Labs Lab 08/31/14 1853 09/01/14 0718  NA 132* 137  K 4.0 3.9  CL 94* 102  CO2 17* 13*  BUN 49* 44*  CREATININE 2.43* 2.06*  CALCIUM 8.9  8.3*  PROT 7.2  --   BILITOT 0.3  --   ALKPHOS 79  --   ALT 20  --   AST 36  --   GLUCOSE 260* 301*   Lactic acid 1.49 >2.7   Imaging/Diagnostic Tests: Dg Abd 1 View  09/01/2014   CLINICAL DATA:  Acute abdominal pain and distention.  EXAM: ABDOMEN - 1 VIEW  COMPARISON:  Radiograph of Feb 25, 2008 ; CT scan of January 01, 2011.  FINDINGS: No colonic dilatation is noted. Minimally dilated small bowel loops are  noted in the left upper quadrant of the abdomen most consistent with ileus. Bilateral renal calculi are noted with the largest measuring 16 mm on the right. Status post cholecystectomy. Phleboliths are noted in the pelvis.  IMPRESSION: Minimally dilated small bowel loops seen in left upper quadrant of the abdomen most consistent with ileus. Followup radiographs are recommended. Bilateral nephrolithiasis is noted as described above.   Electronically Signed   By: Sabino Dick M.D.   On: 09/01/2014 10:11   Ct Head Wo Contrast  08/31/2014   CLINICAL DATA:  Weakness and dizziness for 3 days, near syncopal episode, vomiting  EXAM: CT HEAD WITHOUT CONTRAST  TECHNIQUE: Contiguous axial images were obtained from the base of the skull through the vertex without contrast.  COMPARISON:  None  FINDINGS: Diffuse brain atrophy with prominence of the anterior CSF spaces. Chronic white matter microvascular changes throughout the cerebral hemispheres. No acute intracranial hemorrhage, mass lesion, definite acute infarction, midline shift, herniation, or hydrocephalus. Remote basal ganglia lacunar-type infarcts noted. Cisterns patent. No cerebellar abnormality. Orbits are symmetric. Mastoids and sinuses clear.  IMPRESSION: Diffuse brain atrophy, most pronounced in the frontal lobes.  Chronic white matter microvascular change.  No acute finding by noncontrast CT   Electronically Signed   By: Daryll Brod M.D.   On: 08/31/2014 20:54   Dg Chest Port 1 View  08/31/2014   CLINICAL DATA:  Weakness, diabetes, upper abdominal pain  EXAM: PORTABLE CHEST - 1 VIEW  COMPARISON:  11/23/2011  FINDINGS: Low lung volumes noted with basilar atelectasis and vascular crowding. This is worse in the right lower lobe. No edema, effusion or pneumothorax. Stable right paratracheal soft tissue prominence, suspect vascular. Degenerative changes of the AC joints.  IMPRESSION: Low volume exam with vascular crowding basilar atelectasis.   Electronically  Signed   By: Daryll Brod M.D.   On: 08/31/2014 19:59   Dimas Chyle, MD 09/01/2014, 8:58 AM PGY-1, [place] Intern pager: [phone], text pages welcome

## 2014-09-01 NOTE — Progress Notes (Signed)
ANTIBIOTIC CONSULT NOTE - INITIAL  Pharmacy Consult for Zosyn Indication: rule out sepsis  Allergies  Allergen Reactions  . Compazine   . Cymbalta [Duloxetine Hcl]   . Escitalopram Oxalate Other (See Comments)    Can't sleep  . Methadone Hives  . Nitrofuran Derivatives     Patient Measurements: Height: 4\' 9"  (144.8 cm) Weight: 164 lb 7.4 oz (74.6 kg) IBW/kg (Calculated) : 38.6   Vital Signs: Temp: 100.5 F (38.1 C) (12/02 1636) Temp Source: Oral (12/02 1636) BP: 127/55 mmHg (12/02 1235) Pulse Rate: 119 (12/02 1235) Intake/Output from previous day: 12/01 0701 - 12/02 0700 In: 656.3 [I.V.:656.3] Out: -  Intake/Output from this shift:    Labs:  Recent Labs  08/31/14 1853 09/01/14 0718 09/01/14 1558  WBC 10.7* 6.4  --   HGB 12.0 10.9*  --   PLT 151 118*  --   CREATININE 2.43* 2.06* 1.82*   Estimated Creatinine Clearance: 25.8 mL/min (by C-G formula based on Cr of 1.82). No results for input(s): VANCOTROUGH, VANCOPEAK, VANCORANDOM, GENTTROUGH, GENTPEAK, GENTRANDOM, TOBRATROUGH, TOBRAPEAK, TOBRARND, AMIKACINPEAK, AMIKACINTROU, AMIKACIN in the last 72 hours.   Microbiology: Recent Results (from the past 720 hour(s))  Blood Culture (routine x 2)     Status: None (Preliminary result)   Collection Time: 08/31/14  7:08 PM  Result Value Ref Range Status   Specimen Description BLOOD RIGHT ARM  Final   Special Requests BOTTLES DRAWN AEROBIC AND ANAEROBIC 10CC  Final   Culture  Setup Time   Final    09/01/2014 01:06 Performed at [facility]    Culture   Final    GRAM NEGATIVE RODS Note: Gram Stain Report Called to,Read Back By and Verified With: TINA CHANEY @ [id number] ON [id number] BY [facility] Performed at [facility]    Report Status PENDING  Incomplete  Blood Culture (routine x 2)     Status: None (Preliminary result)   Collection Time: 08/31/14  7:34 PM  Result Value Ref Range Status   Specimen Description BLOOD LEFT ARM  Final   Special Requests  BOTTLES DRAWN AEROBIC AND ANAEROBIC 5CC  Final   Culture  Setup Time   Final    09/01/2014 01:07 Performed at [facility]    Culture   Final    GRAM NEGATIVE RODS Note: Gram Stain Report Called to,Read Back By and Verified With: TINA CHANEY @ [id number] ON [id number] BY [facility] Performed at [facility]    Report Status PENDING  Incomplete  Clostridium Difficile by PCR     Status: None   Collection Time: 09/01/14  1:36 AM  Result Value Ref Range Status   C difficile by pcr NEGATIVE NEGATIVE Final    Medical History: Past Medical History  Diagnosis Date  . Hyperlipidemia   . Hypertension   . Diabetes mellitus without complication     Medications:  Prescriptions prior to admission  Medication Sig Dispense Refill Last Dose  . atorvastatin (LIPITOR) 20 MG tablet Take 1 tablet (20 mg total) by mouth daily. 90 tablet 3 Past Week at Unknown time  . butorphanol (STADOL) 10 MG/ML nasal spray Place 1 spray into the nose every 4 (four) hours as needed for headache. May refill weekly 2.5 mL 5 Past Week at Unknown time  . levothyroxine (SYNTHROID, LEVOTHROID) 75 MCG tablet Take 1 tablet (75 mcg total) by mouth daily. 90 tablet 3 Past Week at Unknown time  . lisinopril (PRINIVIL,ZESTRIL) 20 MG tablet Take 1 tablet (20 mg total)  by mouth daily. 90 tablet 3 Past Week at Unknown time  . metFORMIN (GLUCOPHAGE) 1000 MG tablet Take 1 tablet (1,000 mg total) by mouth daily with breakfast. 90 tablet 3 Past Week at Unknown time  . metoprolol (LOPRESSOR) 100 MG tablet Take 1 tablet (100 mg total) by mouth 2 (two) times daily. 180 tablet 3 Past Week at unk  . ondansetron (ZOFRAN-ODT) 8 MG disintegrating tablet dissolve 1 tablet under the tongue every 8 hours if needed for nausea 20 tablet 4 unk  . PHENobarbital (LUMINAL) 97.2 MG tablet Take 2 tabs daily on Mon, Wed, and Fri. 1 daily on other days. (Patient taking differently: Take 97.2-194.4 mg by mouth See admin instructions. Take 2 tabs daily on  Mon, Wed, and Fri. 1 daily on other days.) 135 tablet 3 Past Week at Unknown time  . zolpidem (AMBIEN) 10 MG tablet take 1 tablet by mouth at bedtime for sleep if needed 30 tablet 5 Past Week at Unknown time  . ondansetron (ZOFRAN-ODT) 8 MG disintegrating tablet DISSOLVE 1 TABLET UNDER TONGUE EVERY 8 HOURS AS NEEDED FOR NAUSEA (Patient not taking: Reported on 08/31/2014) 20 tablet 3 Not Taking at Unknown time   Scheduled:  . enoxaparin (LOVENOX) injection  30 mg Subcutaneous Q24H  . insulin aspart  0-9 Units Subcutaneous TID WC  . PHENobarbital  194.4 mg Oral Q M,W,F  . [START ON 09/02/2014] phenobarbital  97.2 mg Oral Q T,Th,S,Su  . sodium chloride  3 mL Intravenous Q12H   Infusions:  . dextrose 5 % and 0.45 % NaCl with KCl 20 mEq/L 125 mL/hr at 09/01/14 [id number]   Assessment: 63yo female started on ceftriaxone for suspected pyelonephritis. Pharmacy is consulted to dose Zosyn for sepsis. Pt is febrile to 100.5 with Tmax of 103.2, WBC 6.4, sCr 1.82 and trending down with CrCl ~ 35 mL/min. She does have GNRs in her blood cultures from 08/31/14.  Goal of Therapy:  Resolution of infection  Plan:  Zosyn 3.375g IV q8h Follow up culture results and clinical course  Andrey Cota. Diona Foley, PharmD Clinical Pharmacist Pager [phone] 09/01/2014,4:50 PM

## 2014-09-01 NOTE — Progress Notes (Signed)
Paged Family Medicine.  Pt nauseated and has temp 100.5.  Need something for nausea zofran dc'd.  Unable to get orthos at this time.  Pt too nauseated to stand.

## 2014-09-01 NOTE — Progress Notes (Signed)
Paged Family medicine per pt daughter request.

## 2014-09-01 NOTE — Progress Notes (Signed)
Inpatient Diabetes Program Recommendations  AACE/ADA: New Consensus Statement on Inpatient Glycemic Control (2013)  Target Ranges:  Prepandial:   less than 140 mg/dL      Peak postprandial:   less than 180 mg/dL (1-2 hours)      Critically ill patients:  140 - 180 mg/dL   Inpatient Diabetes Program Recommendations Insulin - Basal: add low dose basal Lantus or Levemir 10 units  HgbA1C: order to assess prehospital glucose control   Results for GANELLE, BERENDS (MRN [id number]) as of 09/01/2014 12:20  Ref. Range 08/31/2014 18:53 08/31/2014 19:43 08/31/2014 22:02 09/01/2014 07:18 09/01/2014 11:02  Sodium Latest Range: 137-147 mEq/L 132 (L)   137   Potassium Latest Range: 3.7-5.3 mEq/L 4.0   3.9   Chloride Latest Range: 96-112 mEq/L 94 (L)   102   CO2 Latest Range: 19-32 mEq/L 17 (L)   13 (L)   BUN Latest Range: 6-23 mg/dL 49 (H)   44 (H)   Creatinine Latest Range: 0.50-1.10 mg/dL 2.43 (H)   2.06 (H)   Calcium Latest Range: 8.4-10.5 mg/dL 8.9   8.3 (L)   GFR calc non Af Amer Latest Range: >90 mL/min 20 (L)   24 (L)   GFR calc Af Amer Latest Range: >90 mL/min 23 (L)   28 (L)   Glucose Latest Range: 70-99 mg/dL 260 (H)   301 (H)   Anion gap Latest Range: 5-15  21 (H)   22 (H)   Alkaline Phosphatase Latest Range: 39-117 U/L 79       Pt has anion gap and low CO2.  Could this be DKA?  If not recommend checking A1C and adding low dose basal. Will follow. Thank you  Raoul Pitch BSN, RN,CDE Inpatient Diabetes Coordinator [phone] (team pager)

## 2014-09-01 NOTE — Progress Notes (Signed)
Paged Dr. Garwin Brothers advised temp 103.2, gave tylenol 650 mg po, no new orders at this time.

## 2014-09-02 DIAGNOSIS — R109 Unspecified abdominal pain: Secondary | ICD-10-CM

## 2014-09-02 DIAGNOSIS — E860 Dehydration: Secondary | ICD-10-CM

## 2014-09-02 LAB — URINE CULTURE: Colony Count: >=100000

## 2014-09-02 LAB — BASIC METABOLIC PANEL
ANION GAP: 18 — AB (ref 5–15)
ANION GAP: 15 (ref 5–15)
BUN: 30 mg/dL — ABNORMAL HIGH (ref 6–23)
BUN: 24 mg/dL — ABNORMAL HIGH (ref 6–23)
CALCIUM: 7.9 mg/dL — AB (ref 8.4–10.5)
CO2: 15 mEq/L — ABNORMAL LOW (ref 19–32)
CO2: 18 mEq/L — ABNORMAL LOW (ref 19–32)
CREATININE: 1.63 mg/dL — AB (ref 0.50–1.10)
Calcium: 8.1 mg/dL — ABNORMAL LOW (ref 8.4–10.5)
Chloride: 103 mEq/L (ref 96–112)
Chloride: 102 mEq/L (ref 96–112)
Creatinine, Ser: 1.53 mg/dL — ABNORMAL HIGH (ref 0.50–1.10)
GFR calc Af Amer: 37 mL/min — ABNORMAL LOW (ref >90)
GFR calc non Af Amer: 35 mL/min — ABNORMAL LOW (ref >90)
GFR, EST AFRICAN AMERICAN: 40 mL/min — AB (ref >90)
GFR, EST NON AFRICAN AMERICAN: 32 mL/min — AB (ref >90)
Glucose, Bld: 230 mg/dL — ABNORMAL HIGH (ref 70–99)
Glucose, Bld: 241 mg/dL — ABNORMAL HIGH (ref 70–99)
POTASSIUM: 3.7 meq/L (ref 3.7–5.3)
Potassium: 4.2 mEq/L (ref 3.7–5.3)
Sodium: 136 mEq/L — ABNORMAL LOW (ref 137–147)
Sodium: 135 mEq/L — ABNORMAL LOW (ref 137–147)

## 2014-09-02 LAB — CBC
HCT: 30.9 % — ABNORMAL LOW (ref 36.0–46.0)
Hemoglobin: 10.3 g/dL — ABNORMAL LOW (ref 12.0–15.0)
MCH: 30.2 pg (ref 26.0–34.0)
MCHC: 33.3 g/dL (ref 30.0–36.0)
MCV: 90.6 fL (ref 78.0–100.0)
PLATELETS: 81 10*3/uL — AB (ref 150–400)
RBC: 3.41 MIL/uL — ABNORMAL LOW (ref 3.87–5.11)
RDW: 14.1 % (ref 11.5–15.5)
WBC: 5.2 10*3/uL (ref 4.0–10.5)

## 2014-09-02 LAB — GLUCOSE, CAPILLARY
GLUCOSE-CAPILLARY: 243 mg/dL — AB (ref 70–99)
GLUCOSE-CAPILLARY: 244 mg/dL — AB (ref 70–99)
Glucose-Capillary: 198 mg/dL — ABNORMAL HIGH (ref 70–99)
Glucose-Capillary: 182 mg/dL — ABNORMAL HIGH (ref 70–99)
Glucose-Capillary: 171 mg/dL — ABNORMAL HIGH (ref 70–99)

## 2014-09-02 LAB — PROTIME-INR
INR: 1.23 (ref 0.00–1.49)
PROTHROMBIN TIME: 15.6 s — AB (ref 11.6–15.2)

## 2014-09-02 LAB — APTT: aPTT: 37 seconds (ref 24–37)

## 2014-09-02 NOTE — Progress Notes (Signed)
UR Completed.  Vergie Living [id number] 09/02/2014

## 2014-09-02 NOTE — Plan of Care (Signed)
Problem: Phase II Progression Outcomes Goal: Voiding independently Outcome: Completed/Met Date Met:  09/02/14     

## 2014-09-02 NOTE — Plan of Care (Signed)
Problem: Phase I Progression Outcomes Goal: Pain controlled with appropriate interventions Outcome: Completed/Met Date Met:  09/02/14 Goal: OOB as tolerated unless otherwise ordered Outcome: Completed/Met Date Met:  09/02/14 Goal: Voiding-avoid urinary catheter unless indicated Outcome: Completed/Met Date Met:  09/02/14

## 2014-09-02 NOTE — Evaluation (Signed)
Physical Therapy Evaluation Patient Details Name: Natalie [name] MRN: [id number] DOB: 01/30/1951 Today's Date: 09/02/2014   History of Present Illness  Pt is a 63 y.o. female presenting with pyelonephritis and AKI. PMH is significant for DM 2, HTN, HLD, seizure disorder, and migraines. Pt was admitted with a UTI.  Clinical Impression  Pt admitted with the above. Pt currently with functional limitations due to the deficits listed below (see PT Problem List). At the time of PT eval pt reports extreme fatigue. When PT entered, pt's legs were out of the bed and pt's shoulders were against the opposite bed rail. Despite this position, bed alarm was not alerting. PT assisted to reposition pt in bed. Mod assist and use of bed pad was required for scooting hips and bringing LE's/shoulders back to midline. Pt was able to minimally assist when scooting to [facility]. Of note, RN states that pt was at a stand-by assist level when transferring to the [facility] early this morning.   Pt will benefit from skilled PT to increase their independence and safety with mobility to allow discharge to the venue listed below. Pt states she will be returning home at discharge. Based on performance this session, feel that STR in the SNF setting is more appropriate at this time. If pt shows improvement, CIR may become appropriate for more intensive rehab.     Follow Up Recommendations SNF;Supervision/Assistance - 24 hour    Equipment Recommendations  Rolling walker with 5" wheels;3in1 (PT)    Recommendations for Other Services       Precautions / Restrictions Precautions Precautions: Fall Restrictions Weight Bearing Restrictions: No      Mobility  Bed Mobility Overal bed mobility: Needs Assistance Bed Mobility: Rolling Rolling: Mod assist         General bed mobility comments: Pt requires increased assistance for bed mobility at this time. Moves slowly to position upper and lower extremities to straighten out in bed and  scoot to [facility]. Bed pad used to facilitate repositioning of hips and scooting.   Transfers                 General transfer comment: Pt declined OOB or sitting EOB at this time due to lethargy and fatigue.   Ambulation/Gait                Stairs            Wheelchair Mobility    Modified Rankin (Stroke Patients Only)       Balance                                             Pertinent Vitals/Pain Pain Assessment: No/denies pain    Home Living Family/patient expects to be discharged to:: Private residence Living Arrangements: Spouse/significant other Available Help at Discharge: Family;Available 24 hours/day Type of Home: House Home Access: Stairs to enter   [employer] of Steps: 2-3 Home Layout: One level Home Equipment: Shower seat      Prior Function Level of Independence: Independent         Comments: PLOF is unclear. It appears that pt was independent PTA however no family present to confirm.      Hand Dominance   Dominant Hand: Right    Extremity/Trunk Assessment   Upper Extremity Assessment: Defer to OT evaluation  Lower Extremity Assessment: Generalized weakness      Cervical / Trunk Assessment: Normal  Communication   Communication: No difficulties  Cognition Arousal/Alertness: Lethargic Behavior During Therapy: Flat affect Overall Cognitive Status: No family/caregiver present to determine baseline cognitive functioning                      General Comments      Exercises        Assessment/Plan    PT Assessment Patient needs continued PT services  PT Diagnosis Difficulty walking;Generalized weakness   PT Problem List Decreased strength;Decreased range of motion;Decreased activity tolerance;Decreased balance;Decreased mobility;Decreased knowledge of use of DME;Decreased safety awareness;Decreased knowledge of precautions  PT Treatment Interventions DME  instruction;Gait training;Stair training;Functional mobility training;Therapeutic activities;Therapeutic exercise;Neuromuscular re-education;Patient/family education   PT Goals (Current goals can be found in the Care Plan section) Acute Rehab PT Goals Patient Stated Goal: To return home PT Goal Formulation: With patient Time For Goal Achievement: 09/16/14 Potential to Achieve Goals: Good    Frequency Min 2X/week   Barriers to discharge        Co-evaluation               End of Session Equipment Utilized During Treatment: Oxygen Activity Tolerance: Patient limited by fatigue;Patient limited by lethargy Patient left: in bed;with bed alarm set;with call bell/phone within reach Nurse Communication: Mobility status;Other (comment) (Level of lethargy)         Time: [id number] PT Time Calculation (min) (ACUTE ONLY): 9 min   Charges:   PT Evaluation $Initial PT Evaluation Tier I: 1 Procedure     PT G Codes:          Rolinda Roan 09/02/2014, 9:42 AM  Rolinda Roan, PT, DPT Acute Rehabilitation Services Pager: [phone]

## 2014-09-02 NOTE — Progress Notes (Signed)
Occupational Therapy Evaluation Patient Details Name: Natalie [name] MRN: [id number] DOB: August 30, 1951 Today's Date: 09/02/2014    History of Present Illness Pt is a 63 y.o. female presenting with pyelonephritis and AKI. PMH is significant for DM 2, HTN, HLD, seizure disorder, and migraines. Pt was admitted with a UTI.   Clinical Impression   Patient presents to OT with decreased ADL independence and safety due to the functional limitations below. Will benefit from skilled OT to maximize independence and to facilitate a safe discharge.    Follow Up Recommendations  SNF;Supervision/Assistance - 24 hour    Equipment Recommendations  Other (comment) (tbd at next venue of care)    Recommendations for Other Services       Precautions / Restrictions Precautions Precautions: Fall Restrictions Weight Bearing Restrictions: No      Mobility Bed Mobility Overal bed mobility: Needs Assistance Bed Mobility: Sit to Supine       Sit to supine: Mod assist   General bed mobility comments: Dependent to scoot to [facility] using pad.  Transfers Overall transfer level: Needs assistance Equipment used: 1 person hand held assist Transfers: Sit to/from [facility] Sit to Stand: Min assist Stand pivot transfers: Min assist            Balance                                            ADL Overall ADL's : Needs assistance/impaired Eating/Feeding: Set up Eating/Feeding Details (indicate cue type and reason): drink water from cup. Grooming: Wash/dry hands;Set up   Upper Body Bathing: Moderate assistance   Lower Body Bathing: Maximal assistance   Upper Body Dressing : Moderate assistance   Lower Body Dressing: Maximal assistance   Toilet Transfer: Minimal assistance;BSC   Toileting- Clothing Manipulation and Hygiene: Maximal assistance       Functional mobility during ADLs: Minimal assistance;Moderate assistance;Cueing for safety General ADL  Comments: Lethargy and delirium affected ADL performance. Upon entry, patient attempting OOB with husband to go to [facility] and bed alarm sounding. At end of session, patient reporting back and throat pain. Nurse notified.     Vision                     Perception     Praxis      Pertinent Vitals/Pain Pain Assessment: Faces Faces Pain Scale: Hurts little more Pain Location: back and throat per patient Pain Descriptors / Indicators: Sore;Aching Pain Intervention(s): Monitored during session;Patient requesting pain meds-RN notified     Hand Dominance Right   Extremity/Trunk Assessment Upper Extremity Assessment Upper Extremity Assessment: Generalized weakness   Lower Extremity Assessment Lower Extremity Assessment: Defer to PT evaluation   Cervical / Trunk Assessment Cervical / Trunk Assessment: Normal   Communication Communication Communication: No difficulties   Cognition Arousal/Alertness: Lethargic Behavior During Therapy: Flat affect Overall Cognitive Status: Impaired/Different from baseline Area of Impairment: Orientation;Attention;Memory;Safety/judgement Orientation Level: Disoriented to;Situation;Place Current Attention Level: Sustained Memory: Decreased recall of precautions;Decreased short-term memory   Safety/Judgement: Decreased awareness of safety         General Comments       Exercises       Shoulder Instructions      Home Living Family/patient expects to be discharged to:: Private residence Living Arrangements: Spouse/significant other Available Help at Discharge: Family;Available 24 hours/day Type of Home: House Home Access:  Stairs to enter [employer] of Steps: 2-3   Home Layout: One level     Bathroom Shower/Tub: Occupational psychologist: Standard     Home Equipment: Shower seat          Prior Functioning/Environment Level of Independence: Independent        Comments: Husband reports pt is usually  completely independent with BADLs, IADLs. She is very active PTA. 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 Husband is away for work 3-4 weeks at a time.    OT Diagnosis: Generalized weakness;Cognitive deficits   OT Problem List: Decreased strength;Decreased activity tolerance;Impaired balance (sitting and/or standing);Decreased cognition;Decreased safety awareness;Decreased knowledge of use of DME or AE;Pain   OT Treatment/Interventions: Self-care/ADL training;Therapeutic exercise;Energy conservation;DME and/or AE instruction;Therapeutic activities;Patient/family education    OT Goals(Current goals can be found in the care plan section) Acute Rehab OT Goals Patient Stated Goal: To return home OT Goal Formulation: With patient Time For Goal Achievement: 09/16/14 Potential to Achieve Goals: Good  OT Frequency: Min 2X/week   Barriers to D/C:            Co-evaluation              End of Session Equipment Utilized During Treatment: Oxygen Nurse Communication: Patient requests pain meds;Mobility status  Activity Tolerance: Patient limited by lethargy Patient left: in bed;with call bell/phone within reach;with bed alarm set;with family/visitor present   Time: [id number] OT Time Calculation (min): 32 min Charges:  OT General Charges $OT Visit: 1 Procedure OT Evaluation $Initial OT Evaluation Tier I: 1 Procedure OT Treatments $Self Care/Home Management : 23-37 mins G-Codes:    Jessy Cybulski A Sep 28, 2014, 2:59 PM

## 2014-09-02 NOTE — Plan of Care (Signed)
Problem: Phase I Progression Outcomes Goal: Tolerating diet Outcome: Progressing     

## 2014-09-02 NOTE — Progress Notes (Signed)
Family Medicine Teaching Service Daily Progress Note Intern Pager: [phone]  Patient name: Natalie [name] Medical record number: [id number] Date of birth: 01/11/1951 Age: 63 y.o. Gender: female  Primary Care Provider: [name], MD Consultants: None Code Status: Fill  Pt Overview and Major Events to Date:  12/1 - Admitted with Pyelonephritis and AKI  Assessment and Plan: Natalie [name] is a 63 y.o. female presenting with pyelonephritis and AKI. PMH is significant for DM 2, HTN, HLD, seizure disorder, and migraines.   # Pyelonephritis: UA consistent with UTI; CVA tenderness and fevers. Tachycardia and tachypnea improved with IVF. BP stable. - CTX (12/1-12/2) - IVF: NS @ 125cc/hr - Morphine 1mg  q4hrs prn pain - Avoid zofran prn due to prolonged QTc (548) - Blood culture grew GNRs, likely e coli, will wait for speciation - repeat lactic acid - wnl - started Zosyn (12/2)  # AKI: Cr 2.43 on admit (12/1); Baseline Cr 0.78 (3/15). Likely due to Pyelo and dehydration. Denies taking ACE-I or NSAIDs since being sick. - IVF as below - Continue to monitor  # Abdominal Pain. LLQ tenderness on exam. No acute abdomen. Potentially related to vomiting for past several days.  - KUB shows possible mild ileus - Consider CT abdomen if abdominal pain worsens or if patient deteriorates clinically  # Generalized Weakness and Myalgia: Likely due to Pyelo and AKI. No lateralizing signs on exam.CT head negative for acute pathology. Positive orthostatic on admit.  - Continue to monitor for improvement s/p antibiotics and IVF - PT/OT evaluation  # Seizure disorder: Began as teenager. No seizure activity > 10 years.  - Continue Phenobarbital - Therapeutic phenobarbital levels - Seizure precaution - May consider weaning off as outpatient  #Hypothyroidism: TSH wnl 11/2013 - will consider restarting today.  # DM: A1c 7.5 (05/2014) - Holding metformin due to elevated Cr; May need to continue to hold  at discharge pending AKI resolution - SSI  # Cardiac: HTN, HLD. EKG sinus tachy with prolonged QTc 548 - BP stable off meds: continue to reassess after rehydration - Holding ACE-I; Continue Lipitor - monitoring with tele  FEN/GI:  - IVF: NS @ 125 - Carb mod diet Prophylaxis: Lovenox   Disposition: Admitted to telemetry pending above.  Subjective:  Significant improvement since yesterday. Some nausea. Diarrhea continues. States she feels much better. No new issues overnight.  Objective: Temp:  [97.8 F (36.6 C)-103.2 F (39.6 C)] 99.4 F (37.4 C) (12/03 0433) Pulse Rate:  [90-122] 111 (12/03 0433) Resp:  [22-28] 22 (12/03 0433) BP: (109-136)/(50-78) 136/61 mmHg (12/03 0433) SpO2:  [93 %-97 %] 97 % (12/03 0433) Physical Exam: General: resting in bed; Ill appearing in NAD Cardiovascular: mild tachycardia with reg rhythm; no m/r/g Respiratory: CTAB; normal resp effort Abdomen: +BS, soft, nondistended. Tender to palpation in LLQ. No guarding or rebound.  Extremities: WWP Neuro: Gross Sensory & Motor intact  Laboratory:  Recent Labs Lab 08/31/14 1853 09/01/14 0718 09/02/14 0528  WBC 10.7* 6.4 5.2  HGB 12.0 10.9* 10.3*  HCT 35.8* 33.0* 30.9*  PLT 151 118* 81*    Recent Labs Lab 08/31/14 1853 09/01/14 0718 09/01/14 1558 09/02/14 0528  NA 132* 137 136* 136*  K 4.0 3.9 3.8 4.2  CL 94* 102 105 103  CO2 17* 13* 16* 15*  BUN 49* 44* 38* 30*  CREATININE 2.43* 2.06* 1.82* 1.63*  CALCIUM 8.9 8.3* 7.7* 7.9*  PROT 7.2  --   --   --   BILITOT 0.3  --   --   --  ALKPHOS 79  --   --   --   ALT 20  --   --   --   AST 36  --   --   --   GLUCOSE 260* 301* 256* 230*   Lactic acid 1.49 >> 2.7 >> 1.5   Imaging/Diagnostic Tests: Dg Abd 1 View  09/01/2014   CLINICAL DATA:  Acute abdominal pain and distention.  EXAM: ABDOMEN - 1 VIEW  COMPARISON:  Radiograph of Feb 25, 2008 ; CT scan of January 01, 2011.  FINDINGS: No colonic dilatation is noted. Minimally dilated small  bowel loops are noted in the left upper quadrant of the abdomen most consistent with ileus. Bilateral renal calculi are noted with the largest measuring 16 mm on the right. Status post cholecystectomy. Phleboliths are noted in the pelvis.  IMPRESSION: Minimally dilated small bowel loops seen in left upper quadrant of the abdomen most consistent with ileus. Followup radiographs are recommended. Bilateral nephrolithiasis is noted as described above.   Electronically Signed   By: Sabino Dick M.D.   On: 09/01/2014 10:11   Ct Head Wo Contrast  08/31/2014   CLINICAL DATA:  Weakness and dizziness for 3 days, near syncopal episode, vomiting  EXAM: CT HEAD WITHOUT CONTRAST  TECHNIQUE: Contiguous axial images were obtained from the base of the skull through the vertex without contrast.  COMPARISON:  None  FINDINGS: Diffuse brain atrophy with prominence of the anterior CSF spaces. Chronic white matter microvascular changes throughout the cerebral hemispheres. No acute intracranial hemorrhage, mass lesion, definite acute infarction, midline shift, herniation, or hydrocephalus. Remote basal ganglia lacunar-type infarcts noted. Cisterns patent. No cerebellar abnormality. Orbits are symmetric. Mastoids and sinuses clear.  IMPRESSION: Diffuse brain atrophy, most pronounced in the frontal lobes.  Chronic white matter microvascular change.  No acute finding by noncontrast CT   Electronically Signed   By: Daryll Brod M.D.   On: 08/31/2014 20:54   Dg Chest Port 1 View  08/31/2014   CLINICAL DATA:  Weakness, diabetes, upper abdominal pain  EXAM: PORTABLE CHEST - 1 VIEW  COMPARISON:  11/23/2011  FINDINGS: Low lung volumes noted with basilar atelectasis and vascular crowding. This is worse in the right lower lobe. No edema, effusion or pneumothorax. Stable right paratracheal soft tissue prominence, suspect vascular. Degenerative changes of the AC joints.  IMPRESSION: Low volume exam with vascular crowding basilar atelectasis.    Electronically Signed   By: Daryll Brod M.D.   On: 08/31/2014 19:59   Elberta Leatherwood, MD 09/02/2014, 9:14 AM PGY-1, [place] Intern pager: [phone], text pages welcome

## 2014-09-03 LAB — CULTURE, BLOOD (ROUTINE X 2)

## 2014-09-03 LAB — BASIC METABOLIC PANEL
ANION GAP: 15 (ref 5–15)
BUN: 19 mg/dL (ref 6–23)
CALCIUM: 7.9 mg/dL — AB (ref 8.4–10.5)
CHLORIDE: 103 meq/L (ref 96–112)
CO2: 18 meq/L — AB (ref 19–32)
CREATININE: 1.36 mg/dL — AB (ref 0.50–1.10)
GFR calc Af Amer: 46 mL/min — ABNORMAL LOW (ref >90)
GFR calc non Af Amer: 40 mL/min — ABNORMAL LOW (ref >90)
GLUCOSE: 231 mg/dL — AB (ref 70–99)
Potassium: 4.3 mEq/L (ref 3.7–5.3)
Sodium: 136 mEq/L — ABNORMAL LOW (ref 137–147)

## 2014-09-03 LAB — CBC
HCT: 28.7 % — ABNORMAL LOW (ref 36.0–46.0)
HEMOGLOBIN: 9.4 g/dL — AB (ref 12.0–15.0)
MCH: 29.7 pg (ref 26.0–34.0)
MCHC: 32.8 g/dL (ref 30.0–36.0)
MCV: 90.5 fL (ref 78.0–100.0)
PLATELETS: 106 10*3/uL — AB (ref 150–400)
RBC: 3.17 MIL/uL — AB (ref 3.87–5.11)
RDW: 13.8 % (ref 11.5–15.5)
WBC: 6.3 10*3/uL (ref 4.0–10.5)

## 2014-09-03 LAB — GLUCOSE, CAPILLARY
Glucose-Capillary: 217 mg/dL — ABNORMAL HIGH (ref 70–99)
Glucose-Capillary: 211 mg/dL — ABNORMAL HIGH (ref 70–99)
Glucose-Capillary: 183 mg/dL — ABNORMAL HIGH (ref 70–99)
Glucose-Capillary: 244 mg/dL — ABNORMAL HIGH (ref 70–99)

## 2014-09-03 MED ORDER — LEVOTHYROXINE SODIUM 75 MCG PO TABS
75.0000 ug | ORAL_TABLET | Freq: Every day | ORAL | Status: DC
Start: 2014-09-04 — End: 2014-09-04
  Administered 2014-09-04: 75 ug via ORAL
  Filled 2014-09-03 (×2): qty 1

## 2014-09-03 MED ORDER — CEFUROXIME AXETIL 500 MG PO TABS
500.0000 mg | ORAL_TABLET | Freq: Two times a day (BID) | ORAL | Status: DC
  Administered 2014-09-03 – 2014-09-04 (×2): 500 mg via ORAL
  Filled 2014-09-03 (×4): qty 1

## 2014-09-03 MED ORDER — CEPHALEXIN 500 MG PO CAPS
500.0000 mg | ORAL_CAPSULE | Freq: Four times a day (QID) | ORAL | Status: DC
  Administered 2014-09-03: 500 mg via ORAL
  Filled 2014-09-03 (×4): qty 1

## 2014-09-03 MED ORDER — IPRATROPIUM-ALBUTEROL 0.5-2.5 (3) MG/3ML IN SOLN
3.0000 mL | Freq: Once | RESPIRATORY_TRACT | Status: AC
  Administered 2014-09-03: 3 mL via RESPIRATORY_TRACT

## 2014-09-03 MED ORDER — HYDROCODONE-ACETAMINOPHEN 5-325 MG PO TABS
ORAL_TABLET | ORAL | Status: AC
  Filled 2014-09-03: qty 1

## 2014-09-03 MED ORDER — IPRATROPIUM-ALBUTEROL 0.5-2.5 (3) MG/3ML IN SOLN
RESPIRATORY_TRACT | Status: AC
  Filled 2014-09-03: qty 3

## 2014-09-03 MED ORDER — HYDROCODONE-ACETAMINOPHEN 5-325 MG PO TABS
1.0000 | ORAL_TABLET | Freq: Four times a day (QID) | ORAL | Status: DC | PRN
  Administered 2014-09-03 – 2014-09-04 (×2): 1 via ORAL

## 2014-09-03 MED ORDER — IPRATROPIUM-ALBUTEROL 0.5-2.5 (3) MG/3ML IN SOLN: 3.0000 mL | RESPIRATORY_TRACT | Status: DC

## 2014-09-03 MED ORDER — ATORVASTATIN CALCIUM 20 MG PO TABS
20.0000 mg | ORAL_TABLET | Freq: Every day | ORAL | Status: DC
  Administered 2014-09-03 – 2014-09-04 (×2): 20 mg via ORAL
  Filled 2014-09-03 (×2): qty 1

## 2014-09-03 NOTE — Progress Notes (Signed)
Inpatient Diabetes Program Recommendations  AACE/ADA: New Consensus Statement on Inpatient Glycemic Control (2013)  Target Ranges:  Prepandial:   less than 140 mg/dL      Peak postprandial:   less than 180 mg/dL (1-2 hours)      Critically ill patients:  140 - 180 mg/dL   Reason for Assessment:  Results for Natalie [name], Natalie [name] (MRN [id number]) as of 09/03/2014 13:07  Ref. Range 09/02/2014 11:54 09/02/2014 16:50 09/02/2014 21:56 09/03/2014 08:00 09/03/2014 11:56  Glucose-Capillary Latest Range: 70-99 mg/dL 171 (H) 243 (H) 244 (H) 217 (H) 211 (H)    May consider adding low dose basal such as Lantus 5 units daily.  Thanks, Adah Perl, RN, BC-ADM Inpatient Diabetes Coordinator Pager [phone]

## 2014-09-03 NOTE — Progress Notes (Signed)
**  Interval Note**  Paged by RN.  Patient experiencing cough and wheeze.  Patient placed back on O2 [place] for comfort of breathing.  No desaturations.  Family at bedside.  On exam, patient coughing intermittently, [place] in place, audible upper respiratory tract wheeze, NAD. On lung exam patient with expiratory wheeze and poor air movement b/l, no rhonchi or rales appreciated.  No h/o of prior O2 requirement at home.  No h/o of lung disease or CHF.  Continue O2 PRN comfort of breathing.  Duonebs x1 with pre and post scoring.  RN to reassess and contact providers after completion of neb.  Janesha Brissette M. Lajuana Ripple, DO PGY-1, [facility] 09/03/14, 06:15 pm

## 2014-09-03 NOTE — Progress Notes (Signed)
Family Medicine Teaching Service Daily Progress Note Intern Pager: [phone]  Patient name: CLYDA LUDY Medical record number: [id number] Date of birth: [birth year] Age: 63 y.o. 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 female presenting with pyelonephritis and AKI. PMH is significant for DM 2, HTN, HLD, seizure disorder, and migraines.   # Pyelonephritis: UA consistent with UTI; CVA tenderness and fevers. Tachycardia and tachypnea improved with IVF. BP stable. Last fever 12/2 @ 2104.  - CTX (12/1-12/2) - Zosyn (12/2-12/4) - Ceftin (12/4- ) - IVF: NS @ 125cc/hr - Morphine 1mg  q4hrs prn pain - Avoid zofran prn due to prolonged QTc (548) - Blood culture grew pan sensitive e coli  # AKI: Cr 2.43 on admit (12/1); Baseline Cr 0.78 (3/15). Likely due to Pyelo and dehydration. Denies taking ACE-I or NSAIDs since being sick. - Improving with IVF - Continue to monitor  #Thrombocytopenia. Plt 151 > 118 > 81. Concern for HIT. Lovenox stopped. May be hemodilutional. No signs of bleeding.  -Continue to monitor, improving s/p d/c lovenox  # Abdominal Pain.  No acute abdomen. Potentially related to vomiting for past several days.  - Resolved - Consider CT abdomen if abdominal pain worsens or if patient deteriorates clinically  # Generalized Weakness and Myalgia: Likely due to Pyelo and AKI. No lateralizing signs on exam.CT head negative for acute pathology.  - PT/OT recommend SNF placement.   # Seizure disorder: Began as teenager. No seizure activity > 10 years.  - Continue Phenobarbital - Therapeutic phenobarbital levels - Seizure precaution - May consider weaning off as outpatient  #Hypothyroidism: TSH wnl 11/2013 - Home synthroid 46mcg daily  # DM: A1c 7.5  (05/2014) - Holding metformin due to elevated Cr; May need to continue to hold at discharge pending AKI resolution - SSI  # Cardiac: HTN, HLD. EKG sinus tachy with prolonged QTc 548 - BP stable off meds: continue to reassess after rehydration - Holding ACE-I; Continue Lipitor - monitoring with tele  FEN/GI:  - IVF: NS @ 125 - Carb mod diet Prophylaxis: SCDs  Disposition: Admitted to telemetry pending above. Consult to social work for SNF placement. Anticipate medical clearance by tomorrow.   Subjective:  NAEON. Feeling much better this morning. Agreeable to SNF placement. No nausea or vomiting. No fevers or chills. No abdominal pain. No confusion.   Objective: Temp:  [98.9 F (37.2 C)-99.5 F (37.5 C)] 98.9 F (37.2 C) (12/04 0945) Pulse Rate:  [98-113] 99 (12/04 0945) Resp:  [18-22] 18 (12/04 0945) BP: (114-149)/(62-82) 126/70 mmHg (12/04 0945) SpO2:  [95 %-97 %] 95 % (12/04 0945) Physical Exam: General: resting in bed; NAD Cardiovascular: RRR, no murmurs appreciated Respiratory: CTAB; normal resp effort Abdomen: +BS, soft, nondistended. nontender. No guarding or rebound.  Extremities: WWP Neuro: Gross Sensory & Motor intact  Laboratory:  Recent Labs Lab 09/01/14 0718 09/02/14 0528 09/03/14 1002  WBC 6.4 5.2 6.3  HGB 10.9* 10.3* 9.4*  HCT 33.0* 30.9* 28.7*  PLT 118* 81* 106*    Recent Labs Lab 08/31/14 1853  09/02/14 0528 09/02/14 1845 09/03/14 1002  NA 132*  < > 136* 135* 136*  K 4.0  < > 4.2 3.7 4.3  CL 94*  < > 103 102 103  CO2 17*  < >  15* 18* 18*  BUN 49*  < > 30* 24* 19  CREATININE 2.43*  < > 1.63* 1.53* 1.36*  CALCIUM 8.9  < > 7.9* 8.1* 7.9*  PROT 7.2  --   --   --   --   BILITOT 0.3  --   --   --   --   ALKPHOS 79  --   --   --   --   ALT 20  --   --   --   --   AST 36  --   --   --   --   GLUCOSE 260*  < > 230* 241* 231*  < > = values in this interval not displayed. Lactic acid 1.49 >> 2.7 >> 1.5  Dimas Chyle, MD 09/03/2014, 11:53  AM PGY-1, [place] Intern pager: [phone], text pages welcome

## 2014-09-04 LAB — CBC
HEMATOCRIT: 27.4 % — AB (ref 36.0–46.0)
HEMOGLOBIN: 8.8 g/dL — AB (ref 12.0–15.0)
MCH: 29.1 pg (ref 26.0–34.0)
MCHC: 32.1 g/dL (ref 30.0–36.0)
MCV: 90.7 fL (ref 78.0–100.0)
Platelets: 119 10*3/uL — ABNORMAL LOW (ref 150–400)
RBC: 3.02 MIL/uL — AB (ref 3.87–5.11)
RDW: 14.1 % (ref 11.5–15.5)
WBC: 6.1 10*3/uL (ref 4.0–10.5)

## 2014-09-04 LAB — BASIC METABOLIC PANEL
Anion gap: 16 — ABNORMAL HIGH (ref 5–15)
BUN: 15 mg/dL (ref 6–23)
CALCIUM: 8 mg/dL — AB (ref 8.4–10.5)
CO2: 16 meq/L — AB (ref 19–32)
Chloride: 103 mEq/L (ref 96–112)
Creatinine, Ser: 1.19 mg/dL — ABNORMAL HIGH (ref 0.50–1.10)
GFR calc Af Amer: 54 mL/min — ABNORMAL LOW (ref >90)
GFR calc non Af Amer: 47 mL/min — ABNORMAL LOW (ref >90)
GLUCOSE: 322 mg/dL — AB (ref 70–99)
Potassium: 3.4 mEq/L — ABNORMAL LOW (ref 3.7–5.3)
SODIUM: 135 meq/L — AB (ref 137–147)

## 2014-09-04 LAB — GLUCOSE, CAPILLARY: Glucose-Capillary: 276 mg/dL — ABNORMAL HIGH (ref 70–99)

## 2014-09-04 MED ORDER — CEFUROXIME AXETIL 500 MG PO TABS: 500.0000 mg | ORAL_TABLET | Freq: Two times a day (BID) | ORAL | Status: DC

## 2014-09-04 MED ORDER — HYDROCODONE-ACETAMINOPHEN 5-325 MG PO TABS
ORAL_TABLET | ORAL | Status: AC
  Filled 2014-09-04: qty 1

## 2014-09-04 NOTE — Discharge Summary (Signed)
[facility] Discharge Summary  Patient name: Natalie [name] Medical record number: [id number] Date of birth: [birth year] Age: 63 y.o. Gender: female Date of Admission: 08/31/2014  Date of Discharge:09/04/2014  Admitting Physician: Dickie La, MD  Primary Care Provider: [name], MD Consultants: None  Indication for Hospitalization: Pyelonephritis  Discharge Diagnoses/Problem List:  Pyelonephritis, AKI, T2DM, HTN, HLD, Migraines  Disposition: Home  Discharge Condition: Improved  Discharge Exam:  Blood pressure 118/73, pulse 93, temperature 98.1 F (36.7 C), temperature source Oral, resp. rate 18, height 4\' 9"  (1.448 m), weight 164 lb 3.9 oz (74.5 kg), SpO2 97 %. General: resting in bed; NAD Cardiovascular: RRR, no murmurs appreciated Respiratory: CTAB; normal resp effort Abdomen: +BS, soft, nondistended. nontender. No guarding or rebound.  Extremities: WWP Neuro: Alert and oriented, no focal deficits.   Brief Hospital Course:  Natalie [name] is a 63 y.o. female who presented with pyelonephritis and AKI. PMH is significant for DM 2, HTN, HLD, seizure disorder, and migraines.   Her hospital course by problem is outlined below:  # Pyelonephritis: Patient presented with CVA tenderness, fevers, and UA consistent with UTI. She was initially started on ceftriaxone, and then transitioned to Zosyn for 2 days after blood culture grew out pansensitive e coli. Patient rapidly improved with IVF and IV antibiotics and was transitioned to oral ceftin. She will finish her 10 day total course of antibiotics at home.  # AKI: Patient had Creatinine of 2.43 on admission, up from baseline of 0.78 (3/15). Likely due to Pyelo and dehydration. Patient's renal function improved with IVF and treatment of her pyelonephritis, and was trending back to her baseline at the time of discharge.   # Generalized Weakness and Myalgia: Patient presented with diffuse weakness, likely  due to Pyelo and AKI. She had no focal deficits on neurologic exam and her CT head was negative for acute pathology. PT/OT evaluated the patient and recommended SNF placement.   # HTN. Patient's lisinopril and metoprolol was held in the setting of AKI and relative hypotension.   # T2DM.  Patient only on home metformin, which was held here in setting  AKI. Patient was managed with SSI.   The rest of the patient chronic medical conditions, including seizure disorder, hypothyroidism, and HLD were stable.   Issues for Follow Up:  1) f/u renal function s/p pyelonephritis and AKI here- restarted metformin on discharge given near normal creatinine and overall trend towards baseline with IVF 2)  F/u BP - Held lisinopril and metoprolol given AKI and relative hypotension 3) Consider weaning off phenobarbital as patient has not had any seizure activity in > 10 years  Significant Procedures: None  Significant Labs and Imaging:   Recent Labs Lab 09/02/14 0528 09/03/14 1002 09/04/14 0835  WBC 5.2 6.3 6.1  HGB 10.3* 9.4* 8.8*  HCT 30.9* 28.7* 27.4*  PLT 81* 106* 119*    Recent Labs Lab 08/31/14 1853  09/01/14 1558 09/02/14 0528 09/02/14 1845 09/03/14 1002 09/04/14 0835  NA 132*  < > 136* 136* 135* 136* 135*  K 4.0  < > 3.8 4.2 3.7 4.3 3.4*  CL 94*  < > 105 103 102 103 103  CO2 17*  < > 16* 15* 18* 18* 16*  GLUCOSE 260*  < > 256* 230* 241* 231* 322*  BUN 49*  < > 38* 30* 24* 19 15  CREATININE 2.43*  < > 1.82* 1.63* 1.53* 1.36* 1.19*  CALCIUM 8.9  < > 7.7*  7.9* 8.1* 7.9* 8.0*  ALKPHOS 79  --   --   --   --   --   --   AST 36  --   --   --   --   --   --   ALT 20  --   --   --   --   --   --   ALBUMIN 3.2*  --   --   --   --   --   --   < > = values in this interval not displayed.  Lactic acid 1.49 >> 2.7 >> 1.5   Dg Abd 1 View  09/01/2014   CLINICAL DATA:  Acute abdominal pain and distention.  EXAM: ABDOMEN - 1 VIEW  COMPARISON:  Radiograph of Feb 25, 2008 ; CT scan of January 01, 2011.  FINDINGS: No colonic dilatation is noted. Minimally dilated small bowel loops are noted in the left upper quadrant of the abdomen most consistent with ileus. Bilateral renal calculi are noted with the largest measuring 16 mm on the right. Status post cholecystectomy. Phleboliths are noted in the pelvis.  IMPRESSION: Minimally dilated small bowel loops seen in left upper quadrant of the abdomen most consistent with ileus. Followup radiographs are recommended. Bilateral nephrolithiasis is noted as described above.   Electronically Signed   By: Sabino Dick M.D.   On: 09/01/2014 10:11   Ct Head Wo Contrast  08/31/2014   CLINICAL DATA:  Weakness and dizziness for 3 days, near syncopal episode, vomiting  EXAM: CT HEAD WITHOUT CONTRAST  TECHNIQUE: Contiguous axial images were obtained from the base of the skull through the vertex without contrast.  COMPARISON:  None  FINDINGS: Diffuse brain atrophy with prominence of the anterior CSF spaces. Chronic white matter microvascular changes throughout the cerebral hemispheres. No acute intracranial hemorrhage, mass lesion, definite acute infarction, midline shift, herniation, or hydrocephalus. Remote basal ganglia lacunar-type infarcts noted. Cisterns patent. No cerebellar abnormality. Orbits are symmetric. Mastoids and sinuses clear.  IMPRESSION: Diffuse brain atrophy, most pronounced in the frontal lobes.  Chronic white matter microvascular change.  No acute finding by noncontrast CT   Electronically Signed   By: Daryll Brod M.D.   On: 08/31/2014 20:54   Dg Chest Port 1 View  08/31/2014   CLINICAL DATA:  Weakness, diabetes, upper abdominal pain  EXAM: PORTABLE CHEST - 1 VIEW  COMPARISON:  11/23/2011  FINDINGS: Low lung volumes noted with basilar atelectasis and vascular crowding. This is worse in the right lower lobe. No edema, effusion or pneumothorax. Stable right paratracheal soft tissue prominence, suspect vascular. Degenerative changes of the AC joints.   IMPRESSION: Low volume exam with vascular crowding basilar atelectasis.   Electronically Signed   By: Daryll Brod M.D.   On: 08/31/2014 19:59    Results/Tests Pending at Time of Discharge: None  Discharge Medications:    Medication List    STOP taking these medications        lisinopril 20 MG tablet  Commonly known as:  PRINIVIL,ZESTRIL     metoprolol 100 MG tablet  Commonly known as:  LOPRESSOR      TAKE these medications        atorvastatin 20 MG tablet  Commonly known as:  LIPITOR  Take 1 tablet (20 mg total) by mouth daily.     butorphanol 10 MG/ML nasal spray  Commonly known as:  STADOL  Place 1 spray into the nose every 4 (four) hours as needed  for headache. May refill weekly     cefUROXime 500 MG tablet  Commonly known as:  CEFTIN  Take 1 tablet (500 mg total) by mouth 2 (two) times daily with a meal.     levothyroxine 75 MCG tablet  Commonly known as:  SYNTHROID, LEVOTHROID  Take 1 tablet (75 mcg total) by mouth daily.     metFORMIN 1000 MG tablet  Commonly known as:  GLUCOPHAGE  Take 1 tablet (1,000 mg total) by mouth daily with breakfast.     ondansetron 8 MG disintegrating tablet  Commonly known as:  ZOFRAN-ODT  DISSOLVE 1 TABLET UNDER TONGUE EVERY 8 HOURS AS NEEDED FOR NAUSEA     ondansetron 8 MG disintegrating tablet  Commonly known as:  ZOFRAN-ODT  dissolve 1 tablet under the tongue every 8 hours if needed for nausea     PHENobarbital 97.2 MG tablet  Commonly known as:  LUMINAL  Take 2 tabs daily on Mon, Wed, and Fri. 1 daily on other days.     zolpidem 10 MG tablet  Commonly known as:  AMBIEN  take 1 tablet by mouth at bedtime for sleep if needed        Discharge Instructions: Please refer to Patient Instructions section of EMR for full details.  Patient was counseled important signs and symptoms that should prompt return to medical care, changes in medications, dietary instructions, activity restrictions, and follow up appointments.    Follow-Up Appointments: Follow-up Information    Follow up with [name], MD.   Specialty:  Family Medicine   Contact information:   [place] [id number]       Dimas Chyle, MD 09/04/2014, 10:07 AM PGY-1, [place]

## 2014-09-04 NOTE — Plan of Care (Signed)
Problem: Discharge Progression Outcomes Goal: Discharge plan in place and appropriate Outcome: Completed/Met Date Met:  09/04/14 Goal: Pain controlled with appropriate interventions Outcome: Adequate for Discharge Goal: Tolerating diet Outcome: Adequate for Discharge Goal: Activity appropriate for discharge plan Outcome: Adequate for Discharge

## 2014-09-04 NOTE — Discharge Instructions (Signed)
You were admitted for pyelonephritis, which is an infection in your kidneys. You were given IV antibiotics to treat this. It is important that you follow up with your PCP.   Pyelonephritis, Adult Pyelonephritis is a kidney infection. In general, there are 2 main types of pyelonephritis:  Infections that come on quickly without any warning (acute pyelonephritis).  Infections that persist for a long period of time (chronic pyelonephritis). CAUSES  Two main causes of pyelonephritis are:  Bacteria traveling from the bladder to the kidney. This is a problem especially in pregnant women. The urine in the bladder can become filled with bacteria from multiple causes, including:  Inflammation of the prostate gland (prostatitis).  Sexual intercourse in females.  Bladder infection (cystitis).  Bacteria traveling from the bloodstream to the tissue part of the kidney. Problems that may increase your risk of getting a kidney infection include:  Diabetes.  Kidney stones or bladder stones.  Cancer.  Catheters placed in the bladder.  Other abnormalities of the kidney or ureter. SYMPTOMS   Abdominal pain.  Pain in the side or flank area.  Fever.  Chills.  Upset stomach.  Blood in the urine (dark urine).  Frequent urination.  Strong or persistent urge to urinate.  Burning or stinging when urinating. DIAGNOSIS  Your caregiver may diagnose your kidney infection based on your symptoms. A urine sample may also be taken. TREATMENT  In general, treatment depends on how severe the infection is.   If the infection is mild and caught early, your caregiver may treat you with oral antibiotics and send you home.  If the infection is more severe, the bacteria may have gotten into the bloodstream. This will require intravenous (IV) antibiotics and a hospital stay. Symptoms may include:  High fever.  Severe flank pain.  Shaking chills.  Even after a hospital stay, your caregiver may  require you to be on oral antibiotics for a period of time.  Other treatments may be required depending upon the cause of the infection. HOME CARE INSTRUCTIONS   Take your antibiotics as directed. Finish them even if you start to feel better.  Make an appointment to have your urine checked to make sure the infection is gone.  Drink enough fluids to keep your urine clear or pale yellow.  Take medicines for the bladder if you have urgency and frequency of urination as directed by your caregiver. SEEK IMMEDIATE MEDICAL CARE IF:   You have a fever or persistent symptoms for more than 2-3 days.  You have a fever and your symptoms suddenly get worse.  You are unable to take your antibiotics or fluids.  You develop shaking chills.  You experience extreme weakness or fainting.  There is no improvement after 2 days of treatment. MAKE SURE YOU:  Understand these instructions.  Will watch your condition.  Will get help right away if you are not doing well or get worse. Document Released: 09/17/2005 Document Revised: 03/18/2012 Document Reviewed: 02/21/2011 [facility] Patient Information 2015 [place], [place]. This information is not intended to replace advice given to you by your health care provider. Make sure you discuss any questions you have with your health care provider.

## 2014-09-04 NOTE — Progress Notes (Signed)
AVS discharge instructions were reviewed with patient and her husband. Patient was told that she has a prescription at her pharmacy to pick up. Staff will assist patient to her transportation once [facility] bring her equipment.

## 2014-09-04 NOTE — Clinical Social Work Note (Signed)
CSW received consult. CSW reviewed chart. Patient to d/c home with home health with [place]. No further needs. CSW signing off.  [place], [place] Weekend Clinical Social Worker [phone]

## 2014-09-04 NOTE — Care Management Note (Signed)
    Page 1 of 2   09/04/2014     11:03:56 AM CARE MANAGEMENT NOTE 09/04/2014  Patient:  AMIT, MELOY   Account Number:  [id number]  Date Initiated:  09/02/2014  Documentation initiated by:  ROYAL,CHERYL  Subjective/Objective Assessment:   CM following for progression and d/c planning.     Action/Plan:   Discharge Planning-   Anticipated DC Date:  09/04/2014   Anticipated DC Plan:  [place]  CM consult      [facility] Choice  HOME HEALTH   Choice offered to / List presented to:  C-1 Patient   DME arranged  3-N-1  [place] arranged  HH-1 RN  [place].   Status of service:  Completed, signed off Medicare Important Message given?  YES (If response is "NO", the following Medicare IM given date fields will be blank) Date Medicare IM given:  09/04/2014 Medicare IM given by:  [facility] Date Additional Medicare IM given:   Additional Medicare IM given by:    Discharge Disposition:  [place]  Per UR Regulation:    If discussed at Long Length of Stay Meetings, dates discussed:    Comments:  09/04/14 10.30 am.  CM met with patient in room to offer choice of home health agencey.Patient choses [facility] to render HH/PT/ OT/RN. Address / contact information verified with patient. Referal called to [facility] REP [place].DME rep called [facility] to Deliver 3 in 1 rolling walker to patients room before discharge.No other CM needs at this time.  Jamse Arn RN/ BSN.

## 2014-09-06 ENCOUNTER — Ambulatory Visit (INDEPENDENT_AMBULATORY_CARE_PROVIDER_SITE_OTHER): Payer: Medicare Other | Admitting: Internal Medicine

## 2014-09-06 ENCOUNTER — Ambulatory Visit (INDEPENDENT_AMBULATORY_CARE_PROVIDER_SITE_OTHER): Payer: Medicare Other

## 2014-09-06 VITALS — BP 132/78 | HR 102 | Temp 97.8°F | Resp 18 | Ht <= 58 in | Wt 164.0 lb

## 2014-09-06 DIAGNOSIS — R197 Diarrhea, unspecified: Secondary | ICD-10-CM | POA: Diagnosis not present

## 2014-09-06 DIAGNOSIS — N10 Acute tubulo-interstitial nephritis: Secondary | ICD-10-CM

## 2014-09-06 DIAGNOSIS — E785 Hyperlipidemia, unspecified: Secondary | ICD-10-CM | POA: Diagnosis not present

## 2014-09-06 DIAGNOSIS — I10 Essential (primary) hypertension: Secondary | ICD-10-CM | POA: Diagnosis not present

## 2014-09-06 DIAGNOSIS — R609 Edema, unspecified: Secondary | ICD-10-CM

## 2014-09-06 DIAGNOSIS — N12 Tubulo-interstitial nephritis, not specified as acute or chronic: Secondary | ICD-10-CM | POA: Diagnosis not present

## 2014-09-06 DIAGNOSIS — F05 Delirium due to known physiological condition: Secondary | ICD-10-CM

## 2014-09-06 DIAGNOSIS — R0602 Shortness of breath: Secondary | ICD-10-CM

## 2014-09-06 DIAGNOSIS — B370 Candidal stomatitis: Secondary | ICD-10-CM

## 2014-09-06 DIAGNOSIS — E119 Type 2 diabetes mellitus without complications: Secondary | ICD-10-CM | POA: Diagnosis not present

## 2014-09-06 DIAGNOSIS — R110 Nausea: Secondary | ICD-10-CM

## 2014-09-06 LAB — COMPLETE METABOLIC PANEL WITH GFR
ALBUMIN: 2.9 g/dL — AB (ref 3.5–5.2)
ALT: 17 U/L (ref 0–35)
AST: 19 U/L (ref 0–37)
Alkaline Phosphatase: 90 U/L (ref 39–117)
BUN: 10 mg/dL (ref 6–23)
CHLORIDE: 100 meq/L (ref 96–112)
CO2: 22 mEq/L (ref 19–32)
Calcium: 8.8 mg/dL (ref 8.4–10.5)
Creat: 1.27 mg/dL — ABNORMAL HIGH (ref 0.50–1.10)
GFR, Est African American: 51 mL/min — ABNORMAL LOW
GFR, Est Non African American: 44 mL/min — ABNORMAL LOW
Glucose, Bld: 267 mg/dL — ABNORMAL HIGH (ref 70–99)
Potassium: 3.1 mEq/L — ABNORMAL LOW (ref 3.5–5.3)
SODIUM: 135 meq/L (ref 135–145)
TOTAL PROTEIN: 5.9 g/dL — AB (ref 6.0–8.3)
Total Bilirubin: 0.3 mg/dL (ref 0.2–1.2)

## 2014-09-06 LAB — POCT URINALYSIS DIPSTICK
BILIRUBIN UA: NEGATIVE
Glucose, UA: NEGATIVE
Ketones, UA: NEGATIVE
Nitrite, UA: NEGATIVE
Spec Grav, UA: 1.025
Urobilinogen, UA: 0.2
pH, UA: 6

## 2014-09-06 LAB — POCT CBC
GRANULOCYTE PERCENT: 78.4 % (ref 37–80)
HEMATOCRIT: 31.1 % — AB (ref 37.7–47.9)
Hemoglobin: 9.9 g/dL — AB (ref 12.2–16.2)
Lymph, poc: 1.8 (ref 0.6–3.4)
MCH, POC: 29.8 pg (ref 27–31.2)
MCHC: 31.8 g/dL (ref 31.8–35.4)
MCV: 93.8 fL (ref 80–97)
MID (cbc): 0.9 (ref 0–0.9)
MPV: 8.5 fL (ref 0–99.8)
POC Granulocyte: 9.6 — AB (ref 2–6.9)
POC LYMPH %: 14.4 % (ref 10–50)
POC MID %: 7.2 %M (ref 0–12)
Platelet Count, POC: 253 10*3/uL (ref 142–424)
RBC: 3.31 M/uL — AB (ref 4.04–5.48)
RDW, POC: 15.6 %
WBC: 12.2 10*3/uL — AB (ref 4.6–10.2)

## 2014-09-06 LAB — POCT UA - MICROSCOPIC ONLY
CASTS, UR, LPF, POC: NEGATIVE
Crystals, Ur, HPF, POC: NEGATIVE
Mucus, UA: NEGATIVE
YEAST UA: NEGATIVE

## 2014-09-06 LAB — GLUCOSE, POCT (MANUAL RESULT ENTRY): POC Glucose: 276 mg/dl — AB (ref 70–99)

## 2014-09-06 MED ORDER — LEVOFLOXACIN 750 MG PO TABS: 750.0000 mg | ORAL_TABLET | Freq: Every day | ORAL | Status: DC

## 2014-09-06 MED ORDER — FUROSEMIDE 20 MG PO TABS: 20.0000 mg | ORAL_TABLET | Freq: Every day | ORAL | Status: DC

## 2014-09-06 MED ORDER — ALBUTEROL SULFATE HFA 108 (90 BASE) MCG/ACT IN AERS: 2.0000 | INHALATION_SPRAY | Freq: Four times a day (QID) | RESPIRATORY_TRACT | Status: DC | PRN

## 2014-09-06 MED ORDER — FLUCONAZOLE 150 MG PO TABS: 150.0000 mg | ORAL_TABLET | Freq: Every day | ORAL | Status: DC

## 2014-09-06 MED ORDER — POTASSIUM CHLORIDE CRYS ER 20 MEQ PO TBCR: 20.0000 meq | EXTENDED_RELEASE_TABLET | Freq: Every day | ORAL | Status: DC

## 2014-09-06 MED ORDER — ONDANSETRON 8 MG PO TBDP: ORAL_TABLET | ORAL | Status: DC

## 2014-09-06 MED ORDER — CEFTRIAXONE SODIUM 1 G IJ SOLR
1.0000 g | Freq: Once | INTRAMUSCULAR | Status: AC
  Administered 2014-09-06: 1 g via INTRAMUSCULAR

## 2014-09-06 NOTE — Progress Notes (Signed)
Subjective:    Patient ID: Natalie [name], female    DOB: [birth year], 63 y.o.   MRN: [id number]  HPI discharge from hospital 09/04/2014 after acute admission for confusion found to be secondary to pyelonephritis and acute kidney injury. She has had this before several years ago. She presented with a diffuse weakness as well and was recommended for skilled nursing facility placement after discharge but she and husband decided against this. She was treated with insulin during hospital stay. Her blood pressure medicines were held in the setting of acute kidney injury and relative hypotension.  She continues to  complain of extreme weakness. She is frequently nauseated and has had a poor intake. She complains of low back pain but no fevers or dysuria. No belly pain. No vomiting. Her husband says she is sleeping most of the time since discharge. He reports no further confusion but just no energy. He reports one dose of Stadol for a headache when she got it filled yesterday did make her "out of it" for several hours which is extremely unusual for her. He also has noticed that over recent months her sleep is very fragmented by waking due to snoring. He has occasionally noticed some lapses in breathing as well. She has no complaints of daytime somnolence but her activity level is low. 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    Review of Systems No further fever chills or night sweats No cough or chest pain///she does feel easily winded and in the hospital had an albuterol neb at one point even tho she has never had asthma or wheezing of any kind before. She had an episode of edema of both feet and lower legs on Saturday/ Sunday which is much better today This is not been a problem in the past   she also reports that her mouth hurts/tongue feels funny She has had 3 loose stools today and yesterday-no hematochezia Note she still had some hypocalcemia at discharge/creatinine did return to  normal Hemoglobin was still below 9 at discharge? Why Escherichia coli sensitive to everything  Objective:   Physical Exam  BP 132/78 mmHg  Pulse 102  Temp(Src) 97.8 F (36.6 C) (Oral)  Resp 18  Ht 4\' 10"  (1.473 m)  Wt 164 lb (74.39 kg)  BMI 34.29 kg/m2  SpO2 98% ND but appears frail/no pain Pupils equal round reactive to light and accommodation/EOMs conjugate Cranial nerves intact TMs clear Nares clear Tongue is the few red with white exudate No ac nodes or thyromegaly Heart regular without murmur or gallop and a rate of 80 Lungs are clear with no wheezing on forced expiration although she does cough a bit with forced expiration The abdomen is supple She is tender over the lumbar area with mild percussion tenderness to the flanks//trait leg raise to 90 negative No peripheral motor or sensory losses She has 1+ pitting edema over the anterior shins She is oriented 3/able to answer all questions/able to express understanding of what she and her husband and I discussed.  [facility] reading (PRIMARY) by  Dr. Laney Pastor no active infiltrate and no interstitial engorgement  Results for orders placed or performed in visit on 09/06/14  POCT CBC  Result Value Ref Range   WBC 12.2 (A) 4.6 - 10.2 K/uL   Lymph, poc 1.8 0.6 - 3.4   POC LYMPH PERCENT 14.4 10 - 50 %L   MID (cbc) 0.9 0 - 0.9   POC MID % 7.2 0 - 12 %M  POC Granulocyte 9.6 (A) 2 - 6.9   Granulocyte percent 78.4 37 - 80 %G   RBC 3.31 (A) 4.04 - 5.48 M/uL   Hemoglobin 9.9 (A) 12.2 - 16.2 g/dL   HCT, POC 31.1 (A) 37.7 - 47.9 %   MCV 93.8 80 - 97 fL   MCH, POC 29.8 27 - 31.2 pg   MCHC 31.8 31.8 - 35.4 g/dL   RDW, POC 15.6 %   Platelet Count, POC 253 142 - 424 K/uL   MPV 8.5 0 - 99.8 fL  POCT glucose (manual entry)  Result Value Ref Range   POC Glucose 276 (A) 70 - 99 mg/dl  POCT urinalysis dipstick  Result Value Ref Range   Color, UA yellow    Clarity, UA hazy    Glucose, UA neg    Bilirubin, UA neg    Ketones, UA  neg    Spec Grav, UA 1.025    Blood, UA large    pH, UA 6.0    Protein, UA >=300    Urobilinogen, UA 0.2    Nitrite, UA neg    Leukocytes, UA Trace   POCT UA - Microscopic Only  Result Value Ref Range   WBC, Ur, HPF, POC 30-40    RBC, urine, microscopic 30-40    Bacteria, U Microscopic 1+    Mucus, UA neg    Epithelial cells, urine per micros 5-10    Crystals, Ur, HPF, POC neg    Casts, Ur, LPF, POC neg    Yeast, UA neg        Assessment & Plan:  Problem #1 acute pyelonephritis--her current urine and symptoms suggest she needs more aggressive therapy still She has responded well to Rocephin in the past with Levaquin afterwards(inst of ceftin)  Problem #2 weakness There is an obvious nutritional deficit/we need to recheck electrolytes She is not obviously dehydrated Pushing fluids is recommended  Problem #3 nausea--Zofran  Problem #4 diarrhea--Needs close follow-up to be sure she is not developing CDiff  Problem #5 thrush--Diflucan to be called in  Problem #6 edema--? Secondary to her recent rehydration. Lasix 20 plus kcl20 for 5 days  Problem #7 type 2 diabetes uncontrolled--she has restarted metformin  Problem #8 anemia she has had mild anemia in the past thought secondary to chronic disease/this needs close follow-up  Problem #9 shortness of breath--she does not currently have signs of congestive failure or pneumonia or pulmonary embolus  She has not had asthma in the past but apparently had some form of reactive airway in the hospital  For now she will use a when necessary albuterol inhaler  She will need referral for a sleep study At her husband's request it is also time to consider other options for her headache control--- something that may reduce the frequency of episodes requiring Stadol--- when necessary Stadol was originally started by Dr. Kyung Bacca medical school headache specialist as an alternative for keeping her from needing injections with each  migraine--- this is been her treatment plan for the last decade without problems like the one she experienced yesterday which seem like she got too much medication  76min OV--f/u 48hr

## 2014-09-07 ENCOUNTER — Telehealth: Payer: Self-pay

## 2014-09-07 NOTE — Telephone Encounter (Signed)
-----   Message from Leandrew Koyanagi, MD sent at 09/06/2014 11:38 PM EST ----- Please call Tuesday afternoon to check progress---I can work her in Wednesday at 104 if needed--also tell her to take 2 KCl tabs a day as long as she is taking lasix(5days)

## 2014-09-07 NOTE — Telephone Encounter (Signed)
-----   Message from Leandrew Koyanagi, MD sent at 09/06/2014 11:39 PM EST ----- Oh also tell her i sent in diflucan for her thrush

## 2014-09-07 NOTE — Care Management Note (Signed)
Late Entry:  CARE MANAGEMENT NOTE 09/07/2014  Patient:  Natalie [name], Natalie [name]   Account Number:  [id number]  Date Initiated:  09/02/2014  Documentation initiated by:  Arthuro Canelo  Subjective/Objective Assessment:   CM following for progression and d/c planning.     Action/Plan:   Discharge Planning-   Anticipated DC Date:  09/04/2014   Anticipated DC Plan:  [place]  CM consult      [facility] Choice  HOME HEALTH   Choice offered to / List presented to:  C-1 Patient   DME arranged  3-N-1  [place] arranged  HH-1 RN  [place].   Status of service:  Completed, signed off Medicare Important Message given?  YES (If response is "NO", the following Medicare IM given date fields will be blank) Date Medicare IM given:  09/04/2014 Medicare IM given by:  [facility] Date Additional Medicare IM given:   Additional Medicare IM given by:    Discharge Disposition:  [place]  Per UR Regulation:    If discussed at Long Length of Stay Meetings, dates discussed:    Comments:  09/04/14 10.30 am.  CM met with patient in room to offer choice of home health agencey.Patient choses [facility] to render HH/PT/ OT/RN. Address / contact information verified with patient. Referal called to [facility] RE[facility] [place].DME rep called [facility] to Deliver 3 in 1 rolling walker to patients room before discharge.No other CM needs at this time.  Jamse Arn RN/ BSN. Late Entry: 09/03/2014 Met with pt and IM given , late entry due to computer outage on Friday 09/03/2014. Jasmine Pang RN MPH, case manager, [phone]

## 2014-09-08 ENCOUNTER — Ambulatory Visit (INDEPENDENT_AMBULATORY_CARE_PROVIDER_SITE_OTHER): Payer: Medicare Other | Admitting: Internal Medicine

## 2014-09-08 VITALS — BP 130/60 | HR 83 | Temp 99.2°F | Resp 16 | Ht <= 58 in | Wt 156.6 lb

## 2014-09-08 DIAGNOSIS — B370 Candidal stomatitis: Secondary | ICD-10-CM | POA: Diagnosis not present

## 2014-09-08 DIAGNOSIS — N12 Tubulo-interstitial nephritis, not specified as acute or chronic: Secondary | ICD-10-CM | POA: Diagnosis not present

## 2014-09-08 DIAGNOSIS — N10 Acute tubulo-interstitial nephritis: Secondary | ICD-10-CM

## 2014-09-08 DIAGNOSIS — E119 Type 2 diabetes mellitus without complications: Secondary | ICD-10-CM | POA: Diagnosis not present

## 2014-09-08 DIAGNOSIS — E876 Hypokalemia: Secondary | ICD-10-CM | POA: Diagnosis not present

## 2014-09-08 DIAGNOSIS — I10 Essential (primary) hypertension: Secondary | ICD-10-CM | POA: Diagnosis not present

## 2014-09-08 DIAGNOSIS — E785 Hyperlipidemia, unspecified: Secondary | ICD-10-CM | POA: Diagnosis not present

## 2014-09-08 DIAGNOSIS — J9801 Acute bronchospasm: Secondary | ICD-10-CM

## 2014-09-08 MED ORDER — HYDROCOD POLST-CHLORPHEN POLST 10-8 MG/5ML PO LQCR: 5.0000 mL | Freq: Two times a day (BID) | ORAL | Status: DC

## 2014-09-08 MED ORDER — ALBUTEROL SULFATE (2.5 MG/3ML) 0.083% IN NEBU: 2.5000 mg | INHALATION_SOLUTION | Freq: Four times a day (QID) | RESPIRATORY_TRACT | Status: DC | PRN

## 2014-09-08 NOTE — Progress Notes (Signed)
   Subjective:    Patient ID: Natalie [name], female    DOB: [birth year], 63 y.o.   MRN: [id number]  HPI follow-up from first post hospital visit after discharge from hospitalization for confusion secondary to urinary tract infection,? Sepsis. Over the past 48 hours following Rocephin then changed to Levaquin 750 she has stopped feeling nauseated, improved with regard to flank pain, improved with fluid intake. Her peripheral edema has disappeared after a few doses of Lasix. She is taking oral potassium. She has had no chills and is not aware of any fever. She has however continued to be troubled by cough which is frequent though nonproductive and she feels slightly tight in the chest with mild dyspnea. She has tried the prescribed albuterol inhaler but it does not relieve her symptoms. Note chest x-ray 09/06/2014: IMPRESSION: 1. Stable mild bibasilar atelectasis. 2. Tiny right pleural effusion cannot be excluded. She had treatment in the hospital with nebulizer which improved her cough on one or 2 occasions. No history of congestive heart failure. No history of asthma.  Review of Systems Reports less confusion No headaches since last visit No vision changes Still complaining of extreme fatigue No palpitations No abdominal pain    Objective:   Physical Exam BP 130/60 mmHg  Pulse 83  Temp(Src) 99.2 F (37.3 C) (Oral)  Resp 16  Ht 4\' 9"  (1.448 m)  Wt 156 lb 9.6 oz (71.033 kg)  BMI 33.88 kg/m2  SpO2 96% She is oriented but appears pale and tired No acute distress PERRLA and EOMs conjugate Continues with signs of oral thrush but has not started medicine yet Neck supple Heart regular without murmur with rate of 80 Lungs are clear to auscultation except mild wheeze cough with forced expiration Extremities are now free of edema Cranial n 2 through 12 intact Cerebellar intact No motor sensory losses  Nebulization with albuterol produced significant subjective improvement with  resolution of forced expiratory changes    Assessment & Plan:  Acute pyelonephritis under treatment Reactive airway disease of unclear cause  Add nebulization every 6 hours as needed in order to continue to allow adequate by mouth intake for calorie and fluid  Replenishment Thrush-to pick up Diflucan  Meds ordered this encounter  Medications  . chlorpheniramine-HYDROcodone (TUSSIONEX PENNKINETIC ER) 10-8 MG/5ML LQCR    Sig: Take 5 mLs by mouth 2 (two) times daily.    Dispense:  115 mL    Refill:  0  . albuterol (PROVENTIL) (2.5 MG/3ML) 0.083% nebulizer solution    Sig: Take 3 mLs (2.5 mg total) by nebulization every 6 (six) hours as needed for wheezing or shortness of breath.    Dispense:  150 mL    Refill:  1   Recheck Friday or Sunday depending on course May discontinue Lasix Major drop potassium to 20 mEq a day

## 2014-09-08 NOTE — Telephone Encounter (Signed)
pt advised diflucan sent in to pharmacy. She is still coughing a lot. She would like to come in to follow up with Dr. Laney Pastor. Sent her to 104 to schedule appt.

## 2014-09-09 ENCOUNTER — Telehealth: Payer: Self-pay

## 2014-09-09 DIAGNOSIS — N12 Tubulo-interstitial nephritis, not specified as acute or chronic: Secondary | ICD-10-CM | POA: Diagnosis not present

## 2014-09-09 DIAGNOSIS — I10 Essential (primary) hypertension: Secondary | ICD-10-CM | POA: Diagnosis not present

## 2014-09-09 DIAGNOSIS — E785 Hyperlipidemia, unspecified: Secondary | ICD-10-CM | POA: Diagnosis not present

## 2014-09-09 DIAGNOSIS — E119 Type 2 diabetes mellitus without complications: Secondary | ICD-10-CM | POA: Diagnosis not present

## 2014-09-09 NOTE — Telephone Encounter (Signed)
Patient was given Tussionex and states it will cost her $100 not covered by insurance. Patient unable to pay this requesting it to be changed to "Promezine". Please send to [facility] [address]. Patients call back number is [phone]

## 2014-09-10 DIAGNOSIS — N12 Tubulo-interstitial nephritis, not specified as acute or chronic: Secondary | ICD-10-CM | POA: Diagnosis not present

## 2014-09-10 DIAGNOSIS — E785 Hyperlipidemia, unspecified: Secondary | ICD-10-CM | POA: Diagnosis not present

## 2014-09-10 DIAGNOSIS — I10 Essential (primary) hypertension: Secondary | ICD-10-CM | POA: Diagnosis not present

## 2014-09-10 DIAGNOSIS — E119 Type 2 diabetes mellitus without complications: Secondary | ICD-10-CM | POA: Diagnosis not present

## 2014-09-10 MED ORDER — PROMETHAZINE-DM 6.25-15 MG/5ML PO SYRP: ORAL_SOLUTION | ORAL | Status: DC

## 2014-09-10 NOTE — Telephone Encounter (Signed)
Ok to call in promethazine DM  120cc 1 tsp q 4-6 h as needed

## 2014-09-10 NOTE — Telephone Encounter (Signed)
Pt has an allergy to Compazine Please confirm the promethazine is acceptable to take. Warning populated when sent to the pharmacy. Confirmed with Dr. Laney Pastor ok for pt to use this medication.

## 2014-09-15 ENCOUNTER — Encounter: Payer: Self-pay | Admitting: Internal Medicine

## 2014-09-15 ENCOUNTER — Ambulatory Visit (INDEPENDENT_AMBULATORY_CARE_PROVIDER_SITE_OTHER): Payer: Medicare Other | Admitting: Internal Medicine

## 2014-09-15 VITALS — BP 129/78 | HR 89 | Temp 98.4°F | Resp 16 | Ht <= 58 in | Wt 154.0 lb

## 2014-09-15 DIAGNOSIS — R05 Cough: Secondary | ICD-10-CM | POA: Diagnosis not present

## 2014-09-15 DIAGNOSIS — E876 Hypokalemia: Secondary | ICD-10-CM

## 2014-09-15 DIAGNOSIS — N390 Urinary tract infection, site not specified: Secondary | ICD-10-CM | POA: Diagnosis not present

## 2014-09-15 DIAGNOSIS — N10 Acute tubulo-interstitial nephritis: Secondary | ICD-10-CM

## 2014-09-15 DIAGNOSIS — E785 Hyperlipidemia, unspecified: Secondary | ICD-10-CM | POA: Diagnosis not present

## 2014-09-15 DIAGNOSIS — R059 Cough, unspecified: Secondary | ICD-10-CM

## 2014-09-15 DIAGNOSIS — I10 Essential (primary) hypertension: Secondary | ICD-10-CM | POA: Diagnosis not present

## 2014-09-15 DIAGNOSIS — B370 Candidal stomatitis: Secondary | ICD-10-CM

## 2014-09-15 DIAGNOSIS — N12 Tubulo-interstitial nephritis, not specified as acute or chronic: Secondary | ICD-10-CM | POA: Diagnosis not present

## 2014-09-15 DIAGNOSIS — E119 Type 2 diabetes mellitus without complications: Secondary | ICD-10-CM | POA: Diagnosis not present

## 2014-09-15 LAB — POCT UA - MICROSCOPIC ONLY
Casts, Ur, LPF, POC: NEGATIVE
Crystals, Ur, HPF, POC: NEGATIVE
MUCUS UA: NEGATIVE
Yeast, UA: NEGATIVE

## 2014-09-15 LAB — POCT URINALYSIS DIPSTICK
BILIRUBIN UA: NEGATIVE
Glucose, UA: NEGATIVE
KETONES UA: NEGATIVE
Nitrite, UA: NEGATIVE
Protein, UA: >=300
Urobilinogen, UA: 0.2
pH, UA: 6

## 2014-09-15 MED ORDER — HYDROCODONE-HOMATROPINE 5-1.5 MG/5ML PO SYRP: 5.0000 mL | ORAL_SOLUTION | Freq: Three times a day (TID) | ORAL | Status: DC | PRN

## 2014-09-15 MED ORDER — LEVOFLOXACIN 500 MG PO TABS
500.0000 mg | ORAL_TABLET | Freq: Every day | ORAL | Status: DC
Start: 2014-09-15 — End: 2014-10-18

## 2014-09-15 NOTE — Progress Notes (Signed)
MRN: [id number] DOB: Feb 05, 1951  Subjective:   Natalie [name] is a 63 y.o. female presenting for follow up.  She was hospitalized 08/31/2014-09/04/2014 for pyelonephritis.  Since that visit, she had developed thrush and cough(see xray).  She is now on her last day of the levaquin.  She continues to complain of a non-productive cough.  It continues all day long, and she has costal pain.  Shas also had a loss of appetite, because everything taste salty.  She denies any hematuria, dysuria, or lower abdominal pain.  She has back pain that she equates to her posterior ribs.     Continues poor intake.  Natalie [name] has a current medication list which includes the following prescription(s): albuterol, albuterol, atorvastatin, butorphanol, furosemide, levothyroxine, metformin, ondansetron, phenobarbital, potassium chloride sa, promethazine-dextromethorphan, zolpidem, chlorpheniramine-hydrocodone, fluconazole, and levofloxacin.   is allergic to compazine; cymbalta; escitalopram oxalate; methadone; and nitrofuran derivatives.  Natalie [name]  has a past medical history of Hyperlipidemia; Hypertension; and Diabetes mellitus without complication. Also  has past surgical history that includes Cholecystectomy and Cesarean section.  ROS As in subjective. No more fever No ST Mildly improved intake  Objective:   Vitals: BP 129/78 mmHg  Pulse 89  Temp(Src) 98.4 F (36.9 C)  Resp 16  Ht 4\' 9"  (1.448 m)  Wt 154 lb (69.854 kg)  BMI 33.32 kg/m2  SpO2 98%  Physical Exam  Constitutional: She is oriented to person, place, and time and well-developed, well-nourished, and in no distress. No distress.  HENT:  Head: Normocephalic and atraumatic.  Eyes: Pupils are equal, round, and reactive to light.  Neck: Normal range of motion. Neck supple. No thyromegaly present.  Cardiovascular: Normal rate and regular rhythm.  Exam reveals no gallop and no friction rub.   No murmur heard. Abdominal: Soft. She exhibits no  distension.  Musculoskeletal:  Tenderness along posterior ribs bilaterally  Lymphadenopathy:    She has no cervical adenopathy.  Neurological: She is alert and oriented to person, place, and time.  Skin: Skin is warm and dry. No erythema.  Psychiatric: Mood, memory, affect and judgment normal.    Results for orders placed or performed in visit on 09/15/14 (from the past 24 hour(s))  POCT UA - Microscopic Only     Status: None   Collection Time: 09/15/14  3:27 PM  Result Value Ref Range   WBC, Ur, HPF, POC 15-30    RBC, urine, microscopic 10-30    Bacteria, U Microscopic small    Mucus, UA neg    Epithelial cells, urine per micros 2-4    Crystals, Ur, HPF, POC neg    Casts, Ur, LPF, POC neg    Yeast, UA neg    Renal tubular cells    POCT urinalysis dipstick     Status: None   Collection Time: 09/15/14  3:27 PM  Result Value Ref Range   Color, UA yellow    Clarity, UA cloudy    Glucose, UA neg    Bilirubin, UA neg    Ketones, UA neg    Spec Grav, UA >=1.030    Blood, UA large    pH, UA 6.0    Protein, UA >=300    Urobilinogen, UA 0.2    Nitrite, UA neg    Leukocytes, UA small (1+)      Assessment and Plan :  63 year old female is here today for follow up of pyelonephritis, cough, and thrush.  Patient is not keeping nourished at this time.  Advised patient to increase intake with no diet restriction.  Will increase nutritional supplements and shakes.    Recurrent UTI with Acute pyelonephritis  UA - Microscopic Only, POCT urinalysis dipstick, levofloxacin (LEVAQUIN) 500 MG tablet for another 10 days-this is consistent with her past trouble getting over these--see [facility]  Thrush Stable-improved   Hypokalemia Stable w/o alarming symptoms.  Continue supplement.  Will recheck in 7 days at followup.    Cough  HYDROcodone-homatropine (HYCODAN) 5-1.5 MG/5ML syrup, levofloxacin (LEVAQUIN) 500 MG tablet Follow up within 7 days  Ivar Drape, PA-C assisted  me with this patient RPDoolittle,MD [facility] 12/16/20156:02 PM

## 2014-09-16 DIAGNOSIS — N12 Tubulo-interstitial nephritis, not specified as acute or chronic: Secondary | ICD-10-CM | POA: Diagnosis not present

## 2014-09-16 DIAGNOSIS — E785 Hyperlipidemia, unspecified: Secondary | ICD-10-CM | POA: Diagnosis not present

## 2014-09-16 DIAGNOSIS — E119 Type 2 diabetes mellitus without complications: Secondary | ICD-10-CM | POA: Diagnosis not present

## 2014-09-16 DIAGNOSIS — I10 Essential (primary) hypertension: Secondary | ICD-10-CM | POA: Diagnosis not present

## 2014-09-20 ENCOUNTER — Telehealth: Payer: Self-pay

## 2014-09-20 DIAGNOSIS — E785 Hyperlipidemia, unspecified: Secondary | ICD-10-CM | POA: Diagnosis not present

## 2014-09-20 DIAGNOSIS — I10 Essential (primary) hypertension: Secondary | ICD-10-CM | POA: Diagnosis not present

## 2014-09-20 DIAGNOSIS — E119 Type 2 diabetes mellitus without complications: Secondary | ICD-10-CM | POA: Diagnosis not present

## 2014-09-20 DIAGNOSIS — N12 Tubulo-interstitial nephritis, not specified as acute or chronic: Secondary | ICD-10-CM | POA: Diagnosis not present

## 2014-09-20 NOTE — Telephone Encounter (Signed)
Natalie [name], Pt cllng in requesting refill on Hycodan cough syrup. Please advise if she needs to RTC to be reevaluated.

## 2014-09-20 NOTE — Telephone Encounter (Signed)
Pt states she is in need of her HYDROCODONE 5-1.5 mgs. Please call [phone] when ready for pick up

## 2014-09-21 NOTE — Telephone Encounter (Signed)
Patient called to follow up on her request for cough syrup.  Coughing hard and deep.    [phone]

## 2014-09-22 ENCOUNTER — Encounter: Payer: Self-pay | Admitting: Internal Medicine

## 2014-09-22 ENCOUNTER — Ambulatory Visit (INDEPENDENT_AMBULATORY_CARE_PROVIDER_SITE_OTHER): Payer: Medicare Other | Admitting: Internal Medicine

## 2014-09-22 VITALS — BP 114/70 | HR 91 | Temp 98.1°F | Resp 16 | Ht <= 58 in | Wt 153.0 lb

## 2014-09-22 DIAGNOSIS — R05 Cough: Secondary | ICD-10-CM | POA: Diagnosis not present

## 2014-09-22 DIAGNOSIS — G40909 Epilepsy, unspecified, not intractable, without status epilepticus: Secondary | ICD-10-CM

## 2014-09-22 DIAGNOSIS — E785 Hyperlipidemia, unspecified: Secondary | ICD-10-CM

## 2014-09-22 DIAGNOSIS — E119 Type 2 diabetes mellitus without complications: Secondary | ICD-10-CM

## 2014-09-22 DIAGNOSIS — I10 Essential (primary) hypertension: Secondary | ICD-10-CM

## 2014-09-22 DIAGNOSIS — E039 Hypothyroidism, unspecified: Secondary | ICD-10-CM | POA: Diagnosis not present

## 2014-09-22 DIAGNOSIS — N390 Urinary tract infection, site not specified: Secondary | ICD-10-CM | POA: Diagnosis not present

## 2014-09-22 DIAGNOSIS — E876 Hypokalemia: Secondary | ICD-10-CM

## 2014-09-22 DIAGNOSIS — R059 Cough, unspecified: Secondary | ICD-10-CM

## 2014-09-22 DIAGNOSIS — J4520 Mild intermittent asthma, uncomplicated: Secondary | ICD-10-CM

## 2014-09-22 LAB — CBC WITH DIFFERENTIAL/PLATELET
BASOS PCT: 0 % (ref 0–1)
Basophils Absolute: 0 10*3/uL (ref 0.0–0.1)
EOS ABS: 0.2 10*3/uL (ref 0.0–0.7)
EOS PCT: 3 % (ref 0–5)
HCT: 31.7 % — ABNORMAL LOW (ref 36.0–46.0)
Hemoglobin: 10.5 g/dL — ABNORMAL LOW (ref 12.0–15.0)
Lymphocytes Relative: 24 % (ref 12–46)
Lymphs Abs: 1.7 10*3/uL (ref 0.7–4.0)
MCH: 30 pg (ref 26.0–34.0)
MCHC: 33.1 g/dL (ref 30.0–36.0)
MCV: 90.6 fL (ref 78.0–100.0)
MPV: 10.4 fL (ref 9.4–12.4)
Monocytes Absolute: 0.7 10*3/uL (ref 0.1–1.0)
Monocytes Relative: 10 % (ref 3–12)
Neutro Abs: 4.3 10*3/uL (ref 1.7–7.7)
Neutrophils Relative %: 63 % (ref 43–77)
Platelets: 245 10*3/uL (ref 150–400)
RBC: 3.5 MIL/uL — ABNORMAL LOW (ref 3.87–5.11)
RDW: 15.1 % (ref 11.5–15.5)
WBC: 6.9 10*3/uL (ref 4.0–10.5)

## 2014-09-22 LAB — COMPLETE METABOLIC PANEL WITH GFR
ALK PHOS: 59 U/L (ref 39–117)
ALT: 9 U/L (ref 0–35)
AST: 13 U/L (ref 0–37)
Albumin: 3.6 g/dL (ref 3.5–5.2)
BILIRUBIN TOTAL: 0.2 mg/dL (ref 0.2–1.2)
BUN: 21 mg/dL (ref 6–23)
CO2: 21 meq/L (ref 19–32)
CREATININE: 1.5 mg/dL — AB (ref 0.50–1.10)
Calcium: 9.8 mg/dL (ref 8.4–10.5)
Chloride: 102 mEq/L (ref 96–112)
GFR, EST AFRICAN AMERICAN: 42 mL/min — AB
GFR, Est Non African American: 36 mL/min — ABNORMAL LOW
GLUCOSE: 146 mg/dL — AB (ref 70–99)
Potassium: 5.1 mEq/L (ref 3.5–5.3)
SODIUM: 135 meq/L (ref 135–145)
TOTAL PROTEIN: 6.8 g/dL (ref 6.0–8.3)

## 2014-09-22 LAB — POCT URINALYSIS DIPSTICK
Bilirubin, UA: NEGATIVE
Glucose, UA: NEGATIVE
Ketones, UA: NEGATIVE
Nitrite, UA: NEGATIVE
PH UA: 5
Protein, UA: 100
Spec Grav, UA: 1.015
UROBILINOGEN UA: 0.2

## 2014-09-22 LAB — HEMOGLOBIN A1C
Hgb A1c MFr Bld: 8.7 % — ABNORMAL HIGH (ref <5.7)
Mean Plasma Glucose: 203 mg/dL — ABNORMAL HIGH (ref <117)

## 2014-09-22 LAB — POCT UA - MICROSCOPIC ONLY
Casts, Ur, LPF, POC: NEGATIVE
Crystals, Ur, HPF, POC: NEGATIVE
Epithelial cells, urine per micros: NEGATIVE
Mucus, UA: NEGATIVE
Yeast, UA: NEGATIVE

## 2014-09-22 LAB — LIPID PANEL
CHOLESTEROL: 164 mg/dL (ref 0–200)
HDL: 38 mg/dL — ABNORMAL LOW (ref >39)
LDL Cholesterol: 84 mg/dL (ref 0–99)
TRIGLYCERIDES: 212 mg/dL — AB (ref <150)
Total CHOL/HDL Ratio: 4.3 Ratio
VLDL: 42 mg/dL — AB (ref 0–40)

## 2014-09-22 MED ORDER — METFORMIN HCL 1000 MG PO TABS: 1000.0000 mg | ORAL_TABLET | Freq: Every day | ORAL | Status: DC

## 2014-09-22 MED ORDER — HYDROCODONE-HOMATROPINE 5-1.5 MG/5ML PO SYRP: 5.0000 mL | ORAL_SOLUTION | Freq: Three times a day (TID) | ORAL | Status: DC | PRN

## 2014-09-22 MED ORDER — LEVOTHYROXINE SODIUM 75 MCG PO TABS: 75.0000 ug | ORAL_TABLET | Freq: Every day | ORAL | Status: DC

## 2014-09-22 MED ORDER — BECLOMETHASONE DIPROPIONATE 80 MCG/ACT IN AERS: 1.0000 | INHALATION_SPRAY | Freq: Two times a day (BID) | RESPIRATORY_TRACT | Status: DC

## 2014-09-22 MED ORDER — ATORVASTATIN CALCIUM 20 MG PO TABS
20.0000 mg | ORAL_TABLET | Freq: Every day | ORAL | Status: DC
Start: 2014-09-22 — End: 2015-08-31

## 2014-09-22 MED ORDER — PHENOBARBITAL 97.2 MG PO TABS: ORAL_TABLET | ORAL | Status: DC

## 2014-09-22 NOTE — Progress Notes (Signed)
Subjective:    Patient ID: Natalie [name], female    DOB: [birth year], 63 y.o.   MRN: [id number]  HPIf/u Pyelo/sepsis Poor nutritional intake in aftermath with hypoK Persistent cough 3d left to reach 20d rx with levaquin posthospitalization  Also needs med ref for mail order Patient Active Problem List   Diagnosis Date Noted  . 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 BMI 33.0-33.9,adult 03/30/2013  . Recurrent UTI 09/17/2012  . HTN (hypertension)---no recent problems 03/19/2012  . Hyperlipemia---ck labs 03/19/2012  . Hypothyroidism---labs 03/19/2012  . Seizure disorder---she felt "prodromal when labs were held for 3 d during hospitalization til restarted 03/19/2012  . Insomnia---resp well to [facility] 03/19/2012  . Migraine syndrome-stable ///there is a need to reconsider chronic stadol use from husband's point of view tho she is not in favor 03/19/2012   . AKI (acute kidney injury)--labs ? Cr back to nl  . Weakness---improving--still fatigues very easily  . UTI (lower urinary tract infection)  . Pyelonephritis    -  Anemia post hospit--reck  In general she is continuing to improve. Better intake though not back to normal. Better Activity though very fatigued with any thing she tries to do. No fever chills or night sweats No more nausea Cough continues to be problematic when she's out a cough medicine although nonproductive.   Review of Systems  Constitutional: Positive for activity change, appetite change and fatigue. Negative for fever, chills and unexpected weight change.  HENT: Negative for congestion and trouble swallowing.   Eyes: Negative for photophobia and visual disturbance.  Respiratory: Negative for choking, chest tightness, shortness of breath and wheezing.   Cardiovascular: Negative for chest pain and leg swelling.       Edema from hospitalization has resolved  Gastrointestinal: Negative for abdominal pain,  diarrhea and constipation.  Genitourinary: Negative for dysuria, frequency, hematuria and difficulty urinating.       She rarely has dysuria with her urinary tract infections  Musculoskeletal: Negative for back pain and joint swelling.  Skin: Negative for rash.  Neurological: Negative for dizziness.       Her headaches have remained amazingly stable through the last 3 weeks  Psychiatric/Behavioral: Negative for confusion, dysphoric mood and decreased concentration.       Her confusion resolved after treatment of sepsis       Objective:   Physical Exam Wt Readings from Last 3 Encounters:  09/22/14 153 lb (69.4 kg)  09/15/14 154 lb (69.854 kg)  09/08/14 156 lb 9.6 oz (71.033 kg)  09/09/2013      155 No acute distress PERRLA and EOMs conjugate No thyromegaly Heart regular without murmur Lungs clear to auscultation with no wheezing on forced expiration and no e to a changes at the bases posteriorly Abdomen is supple Extremities without edema/good peripheral pulses/no sensory losses Cranial nerves II through XII intact Cerebellar stable Mood good/affect appropriate BP 114/70 mmHg  Pulse 91  Temp(Src) 98.1 F (36.7 C) (Oral)  Resp 16  Ht _0  (1.422 m)  Wt 153 lb (69.4 kg)  BMI 34.32 kg/m2  SpO2 97%      Assessment & Plan:  Hypothyroidism, unspecified hypothyroidism type - Plan: levothyroxine (SYNTHROID, LEVOTHROID) 75 MCG tablet, TSH, T4, free  Type 2 diabetes mellitus without complication although recent concern for nephropathy - Plan: metFORMIN (GLUCOPHAGE) 1000 MG tablet, Hemoglobin A1c, CBC with Differential, COMPLETE METABOLIC PANEL WITH GFR  Seizure disorder -she was advised many years ago several times that it  was okay to discontinue phenobarbital and every time this has been tried she has had a recurrence that she considered to be some sort of seizure activity. She is adamant about wanting to continue this medication for his long as possible. Plan: PHENobarbital  (LUMINAL) 97.2 MG tablet, Lipid panel  Cough secondary to Reactive airway disease, mild intermittent, uncomplicated - Plan: beclomethasone (QVAR) 80 MCG/ACT inhaler added for one month HYDROcodone-homatropine (HYCODAN) 5-1.5 MG/5ML syrup refilled///okay to refill again if she runs out before well  Hypokalemia - Plan: Labs  Hyperlipemia - Plan: Lipid panel  Essential hypertension - Plan: COMPLETE METABOLIC PANEL WITH GFR  Recurrent UTI - Plan: POCT UA - Microscopic Only, POCT urinalysis dipstick, Urine culture    Meds ordered this encounter  Medications  . atorvastatin (LIPITOR) 20 MG tablet    Sig: Take 1 tablet (20 mg total) by mouth daily.    Dispense:  90 tablet    Refill:  3  . levothyroxine (SYNTHROID, LEVOTHROID) 75 MCG tablet    Sig: Take 1 tablet (75 mcg total) by mouth daily.    Dispense:  90 tablet    Refill:  3  . metFORMIN (GLUCOPHAGE) 1000 MG tablet    Sig: Take 1 tablet (1,000 mg total) by mouth daily with breakfast.    Dispense:  90 tablet    Refill:  3  . PHENobarbital (LUMINAL) 97.2 MG tablet    Sig: Take 2 tabs daily on Mon, Wed, and Fri. 1 daily on other days.    Dispense:  135 tablet    Refill:  3  . HYDROcodone-homatropine (HYCODAN) 5-1.5 MG/5ML syrup    Sig: Take 5 mLs by mouth every 8 (eight) hours as needed for cough.    Dispense:  120 mL    Refill:  0  . beclomethasone (QVAR) 80 MCG/ACT inhaler    Sig: Inhale 1 puff into the lungs 2 (two) times daily.    Dispense:  1 Inhaler    Refill:  0   Addendum 09/23/14-labs Results for orders placed or performed in visit on 09/22/14  Hemoglobin A1c  Result Value Ref Range   Hgb A1c MFr Bld-----has lost her usual good control of diabetes because of recent hospitalization  8.7 (H) <5.7 %   Mean Plasma Glucose 203 (H) <117 mg/dL  CBC with Differential  Result Value Ref Range   WBC 6.9 4.0 - 10.5 K/uL   RBC 3.50 (L) 3.87 - 5.11 MIL/uL   Hemoglobin-----continues to improve posthospitalization  10.5 (L)  12.0 - 15.0 g/dL   HCT 31.7 (L) 36.0 - 46.0 %   MCV 90.6 78.0 - 100.0 fL   MCH 30.0 26.0 - 34.0 pg   MCHC 33.1 30.0 - 36.0 g/dL   RDW 15.1 11.5 - 15.5 %   Platelets 245 150 - 400 K/uL   MPV 10.4 9.4 - 12.4 fL   Neutrophils Relative % 63 43 - 77 %   Neutro Abs 4.3 1.7 - 7.7 K/uL   Lymphocytes Relative 24 12 - 46 %   Lymphs Abs 1.7 0.7 - 4.0 K/uL   Monocytes Relative 10 3 - 12 %   Monocytes Absolute 0.7 0.1 - 1.0 K/uL   Eosinophils Relative 3 0 - 5 %   Eosinophils Absolute 0.2 0.0 - 0.7 K/uL   Basophils Relative 0 0 - 1 %   Basophils Absolute 0.0 0.0 - 0.1 K/uL   Smear Review Criteria for review not met   COMPLETE METABOLIC PANEL WITH GFR  Result Value Ref Range   Sodium 135 135 - 145 mEq/L   Potassium 5.1 3.5 - 5.3 mEq/L   Chloride 102 96 - 112 mEq/L   CO2 21 19 - 32 mEq/L   Glucose, Bld 146 (H) 70 - 99 mg/dL   BUN 21 6 - 23 mg/dL   Creat--------- has not returned to normal yet //this may become a permanent level of dysfunction  1.50 (H) 0.50 - 1.10 mg/dL   Total Bilirubin 0.2 0.2 - 1.2 mg/dL   Alkaline Phosphatase 59 39 - 117 U/L   AST 13 0 - 37 U/L   ALT 9 0 - 35 U/L   Total Protein 6.8 6.0 - 8.3 g/dL   Albumin 3.6 3.5 - 5.2 g/dL   Calcium 9.8 8.4 - 10.5 mg/dL   GFR, Est African American 42 (L) mL/min   GFR, Est Non African American 36 (L) mL/min  TSH  Result Value Ref Range   TSH------ stable replacement  3.338 0.350 - 4.500 uIU/mL  T4, free  Result Value Ref Range   Free T4 1.16 0.80 - 1.80 ng/dL  Lipid panel  Result Value Ref Range   Cholesterol 164 0 - 200 mg/dL   Triglycerides 212 (H) <150 mg/dL   HDL 38 (L) >39 mg/dL   Total CHOL/HDL Ratio 4.3 Ratio   VLDL 42 (H) 0 - 40 mg/dL   LDL Cholesterol------ good results with treatment  84 0 - 99 mg/dL  POCT UA - Microscopic Only  Result Value Ref Range   WBC, Ur, HPF, POC tntc    RBC, urine, microscopic tntc    Bacteria, U Microscopic 1+    Mucus, UA neg    Epithelial cells, urine per micros neg    Crystals,  Ur, HPF, POC neg    Casts, Ur, LPF, POC neg    Yeast, UA neg   POCT urinalysis dipstick  Result Value Ref Range   Color, UA yellow    Clarity, UA slightly cloudy    Glucose, UA neg    Bilirubin, UA neg    Ketones, UA neg    Spec Grav, UA 1.015    Blood, UA large    pH, UA 5.0    Protein, UA 100    Urobilinogen, UA 0.2    Nitrite, UA neg    Leukocytes, UA moderate (2+)   Urine culture added  This level of persistent pyuria is concerning for an underlying nidus of infection-she is known to have kidney stones that are residual--- she is completing 3 weeks of therapy with quinolones posthospitalization after failing to respond to cephalosporins given on discharge--- renal ultrasound as indicated and will be scheduled after the holidays Consideration for ace/arb at f/u  42mn OV

## 2014-09-23 LAB — T4, FREE: FREE T4: 1.16 ng/dL (ref 0.80–1.80)

## 2014-09-23 LAB — TSH: TSH: 3.338 u[IU]/mL (ref 0.350–4.500)

## 2014-09-24 LAB — URINE CULTURE
Colony Count: NO GROWTH
ORGANISM ID, BACTERIA: NO GROWTH

## 2014-09-26 ENCOUNTER — Telehealth: Payer: Self-pay

## 2014-09-26 DIAGNOSIS — R059 Cough, unspecified: Secondary | ICD-10-CM

## 2014-09-26 DIAGNOSIS — R05 Cough: Secondary | ICD-10-CM

## 2014-09-26 NOTE — Telephone Encounter (Signed)
The patient called again to inquire about receiving a refill of her HYDROcodone-homatropine (HYCODAN) 5-1.5 MG/5ML syrup.  She said that she was told that a note would be added in her chart that allowed it to be filled "at any time she needed it" instead of requiring an office visit.  Please advise.  Thank you.  CB#: [phone]

## 2014-09-26 NOTE — Telephone Encounter (Signed)
Pt called in stating that she needed a refill on her HYDROcodone-homatropine (HYCODAN) 5-1.5 MG/5ML syrup she would like to be called back at [phone]

## 2014-09-28 MED ORDER — HYDROCODONE-HOMATROPINE 5-1.5 MG/5ML PO SYRP: 5.0000 mL | ORAL_SOLUTION | Freq: Three times a day (TID) | ORAL | Status: DC | PRN

## 2014-09-28 NOTE — Telephone Encounter (Signed)
LM to advise pt ready for pickup.

## 2014-10-03 ENCOUNTER — Ambulatory Visit (INDEPENDENT_AMBULATORY_CARE_PROVIDER_SITE_OTHER): Payer: Medicare Other | Admitting: Internal Medicine

## 2014-10-03 VITALS — BP 130/72 | HR 71 | Temp 97.7°F | Resp 20 | Ht <= 58 in | Wt 151.4 lb

## 2014-10-03 DIAGNOSIS — E1129 Type 2 diabetes mellitus with other diabetic kidney complication: Secondary | ICD-10-CM

## 2014-10-03 DIAGNOSIS — R05 Cough: Secondary | ICD-10-CM | POA: Diagnosis not present

## 2014-10-03 DIAGNOSIS — E119 Type 2 diabetes mellitus without complications: Secondary | ICD-10-CM | POA: Diagnosis not present

## 2014-10-03 DIAGNOSIS — R809 Proteinuria, unspecified: Secondary | ICD-10-CM

## 2014-10-03 DIAGNOSIS — J4520 Mild intermittent asthma, uncomplicated: Secondary | ICD-10-CM

## 2014-10-03 DIAGNOSIS — I10 Essential (primary) hypertension: Secondary | ICD-10-CM | POA: Diagnosis not present

## 2014-10-03 DIAGNOSIS — G43719 Chronic migraine without aura, intractable, without status migrainosus: Secondary | ICD-10-CM

## 2014-10-03 DIAGNOSIS — R059 Cough, unspecified: Secondary | ICD-10-CM

## 2014-10-03 DIAGNOSIS — N390 Urinary tract infection, site not specified: Secondary | ICD-10-CM | POA: Diagnosis not present

## 2014-10-03 MED ORDER — BENZONATATE 100 MG PO CAPS: 100.0000 mg | ORAL_CAPSULE | Freq: Three times a day (TID) | ORAL | Status: DC | PRN

## 2014-10-03 MED ORDER — BECLOMETHASONE DIPROPIONATE 80 MCG/ACT IN AERS: 1.0000 | INHALATION_SPRAY | Freq: Two times a day (BID) | RESPIRATORY_TRACT | Status: DC

## 2014-10-03 MED ORDER — BUTORPHANOL TARTRATE 10 MG/ML NA SOLN: 1.0000 | NASAL | Status: DC | PRN

## 2014-10-03 MED ORDER — HYDROCODONE-HOMATROPINE 5-1.5 MG/5ML PO SYRP: 5.0000 mL | ORAL_SOLUTION | Freq: Three times a day (TID) | ORAL | Status: DC | PRN

## 2014-10-03 MED ORDER — LOSARTAN POTASSIUM 50 MG PO TABS: 50.0000 mg | ORAL_TABLET | Freq: Every day | ORAL | Status: DC

## 2014-10-03 MED ORDER — PROMETHAZINE-DM 6.25-15 MG/5ML PO SYRP: 5.0000 mL | ORAL_SOLUTION | Freq: Four times a day (QID) | ORAL | Status: DC | PRN

## 2014-10-03 NOTE — Progress Notes (Signed)
Subjective:    Patient ID: Natalie [name], female    DOB: 1951/07/07, 64 y.o.   MRN: [id number]  HPI Chief Complaint  Patient presents with  . Cough    not improving, dry cough  . Follow-up    has been here every wednesday   This chart was scribed for [name], MD by Thea Alken, ED Scribe. This patient was seen in room 11 and the patient's care was started at 8:23 AM.  HPI Comments: MALEEAH HOEFER is a 64 y.o. female who presents to the [facility] follow up. Pt was hospitalized 08/31/2014-09/04/2014 for pyelonephritis. Pt has had an unchanged, persistent, dry cough but is now vomiting after eating due to coughing with meals. Pt's last CXR was 1 month ago-note sl abnormality seen in hospital that was improved here. She reports once she is out of cough syrup cough immediately returns. She has had some postnasal drip during the day but not at night. Pt states she also continues to use Qvar but does not have much left. Pt denies wheezing and no longer feels any response to albuterol. She denies fever, back pain and GERD/indigestion.   She has not had a recurrence of urinary symptoms  She is out of medication for her headaches//currently considering reevaluation so that we can reduce the need for stadol for HAs. Her husband has expressed concern.  Lisinopril was discontinued during the hospital and has not been restarted/needs new prescription.  BP Readings from Last 3 Encounters:  10/03/14 130/72  09/22/14 114/70  09/15/14 129/78   she has not needed medication obviously   Past Medical History  Diagnosis Date  . Hyperlipidemia   . Hypertension   . Diabetes mellitus without complication    Allergies  Allergen Reactions  . Compazine   . Cymbalta [Duloxetine Hcl]   . Escitalopram Oxalate Other (See Comments)    Can't sleep  . Methadone Hives  . Nitrofuran Derivatives    Prior to Admission medications   Medication Sig Start Date End Date Taking?  Authorizing Provider  albuterol (PROVENTIL HFA;VENTOLIN HFA) 108 (90 BASE) MCG/ACT inhaler Inhale 2 puffs into the lungs every 6 (six) hours as needed for wheezing or shortness of breath. 09/06/14  Yes Leandrew Koyanagi, MD  atorvastatin (LIPITOR) 20 MG tablet Take 1 tablet (20 mg total) by mouth daily. 09/22/14  Yes Leandrew Koyanagi, MD  beclomethasone (QVAR) 80 MCG/ACT inhaler Inhale 1 puff into the lungs 2 (two) times daily. 09/22/14  Yes Leandrew Koyanagi, MD  butorphanol (STADOL) 10 MG/ML nasal spray Place 1 spray into the nose every 4 (four) hours as needed for headache. May refill weekly 08/06/14  Yes Sarah Alleen Borne, PA-C  HYDROcodone-homatropine [facility]) 5-1.5 MG/5ML syrup Take 5 mLs by mouth every 8 (eight) hours as needed for cough. 09/28/14  Yes Leandrew Koyanagi, MD  levothyroxine (SYNTHROID, LEVOTHROID) 75 MCG tablet Take 1 tablet (75 mcg total) by mouth daily. 09/22/14  Yes Leandrew Koyanagi, MD  metFORMIN (GLUCOPHAGE) 1000 MG tablet Take 1 tablet (1,000 mg total) by mouth daily with breakfast. 09/22/14  Yes Leandrew Koyanagi, MD  ondansetron (ZOFRAN-ODT) 8 MG disintegrating tablet dissolve 1 tablet under the tongue every 8 hours if needed for nausea 03/24/14  Yes Leandrew Koyanagi, MD  PHENobarbital (LUMINAL) 97.2 MG tablet Take 2 tabs daily on Mon, Wed, and Fri. 1 daily on other days. 09/22/14  Yes Leandrew Koyanagi, MD  zolpidem (AMBIEN) 10 MG tablet  take 1 tablet by mouth at bedtime for sleep if needed 08/18/14  Yes Leandrew Koyanagi, MD  levofloxacin (LEVAQUIN) 500 MG tablet Take 1 tablet (500 mg total) by mouth daily. Patient not taking: Reported on 10/03/2014 09/15/14  completed 20 day course   Stephanie D English, PA   Review of Systems  Constitutional: Negative for fever and chills.  HENT: Positive for postnasal drip.   Respiratory: Negative for wheezing.   Musculoskeletal: Negative for back pain.   Objective:   Physical Exam  Constitutional: She is oriented to  person, place, and time. She appears well-developed and well-nourished. No distress.  Color continues to improve  HENT:  Head: Normocephalic and atraumatic.  Eyes: Conjunctivae and EOM are normal. Pupils are equal, round, and reactive to light.  Neck: Neck supple. No thyromegaly present.  Cardiovascular: Normal rate, regular rhythm and normal heart sounds.  Exam reveals no gallop and no friction rub.   No murmur heard. Pulmonary/Chest: Effort normal and breath sounds normal. No respiratory distress. She has no wheezes. She has no rales. She exhibits no tenderness.  No e to a changes at the bases and no decreased breath sounds  Musculoskeletal: Normal range of motion.  Lymphadenopathy:    She has no cervical adenopathy.  Neurological: She is alert and oriented to person, place, and time. No cranial nerve deficit.  Psychiatric: She has a normal mood and affect. Her behavior is normal.  Nursing note and vitals reviewed.  Filed Vitals:   10/03/14 0821  BP: 130/72  Pulse: 71  Temp: 97.7 F (36.5 C)  Resp: 20   Wt Readings from Last 3 Encounters:  10/03/14 151 lb 6.4 oz (68.675 kg)  09/22/14 153 lb (69.4 kg)  09/15/14 154 lb (69.854 kg)    Assessment & Plan:   Reactive airway disease, mild intermittent, uncomplicated - Plan: beclomethasone (QVAR) 80 MCG/ACT inhaler  Cough/prolonged - Plan: HYDROcodone-homatropine (HYCODAN) 5-1.5 MG/5ML syrup refilled and in addition she'll use Tessalon Perles and Phenergan DM along with Qvar. If she has not resolved in 2-4 weeks x-ray will be repeated. CT scan indicated if x-ray doesn't reveal a cause.  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 losartan (COZAAR) 50 MG tablet    Sig: Take 1 tablet (50 mg total) by mouth daily.    Dispense:  90 tablet    Refill:  3  . promethazine-dextromethorphan (PROMETHAZINE-DM) 6.25-15 MG/5ML syrup    Sig: Take 5 mLs by mouth 4 (four) times daily as needed for cough.    Dispense:  180 mL    Refill:  1  . benzonatate (TESSALON PERLES) 100 MG capsule    Sig: Take 1 capsule (100 mg total) by mouth 3 (three) times daily as needed for cough.    Dispense:  30 capsule    Refill:  1  . beclomethasone (QVAR) 80 MCG/ACT inhaler    Sig: Inhale 1 puff into the lungs 2 (two) times daily.    Dispense:  1 Inhaler    Refill:  0  . butorphanol (STADOL) 10 MG/ML nasal spray    Sig: Place 1 spray into the nose every 4 (four) hours as needed for headache. May refill weekly    Dispense:  2.5 mL    Refill:  5  . HYDROcodone-homatropine (HYCODAN) 5-1.5 MG/5ML syrup    Sig: Take 5 mLs by mouth every 8 (eight) hours as needed for cough.    Dispense:  240 mL    Refill:  0    I have completed the patient encounter in its entirety as documented by the scribe, with editing by me where necessary. Dameir Gentzler P. Laney Pastor, M.D.

## 2014-10-12 ENCOUNTER — Ambulatory Visit (INDEPENDENT_AMBULATORY_CARE_PROVIDER_SITE_OTHER): Payer: Medicare Other | Admitting: Internal Medicine

## 2014-10-12 VITALS — BP 142/80 | HR 75 | Temp 99.0°F | Resp 18 | Ht <= 58 in | Wt 151.8 lb

## 2014-10-12 DIAGNOSIS — G4700 Insomnia, unspecified: Secondary | ICD-10-CM | POA: Diagnosis not present

## 2014-10-12 DIAGNOSIS — G43709 Chronic migraine without aura, not intractable, without status migrainosus: Secondary | ICD-10-CM | POA: Diagnosis not present

## 2014-10-12 MED ORDER — ZOLPIDEM TARTRATE 10 MG PO TABS: 10.0000 mg | ORAL_TABLET | Freq: Every evening | ORAL | Status: DC | PRN

## 2014-10-12 NOTE — Progress Notes (Signed)
Subjective:  This chart was scribed for Natalie Lin, MD by Erling Conte, Medical Scribe. This patient was seen in Room 10 and the patient's care was started at 6:34 PM.   Patient ID: Natalie [name], female    DOB: Sep 06, 1951, 64 y.o.   MRN: [id number]  Chief Complaint  Patient presents with  . Prescriptions    Pt. has paperwork to be filled out for prescriptions.     HPI HPI Comments: Natalie [name] is a 64 y.o. female who presents to the [facility] for paperwork to be filled out for her Stadol prescription for another prior auth. Pt denies any issues with her medications.  She has been on this medication for many years, first started and headache clinic at [facility], and use mainly to avoid her frequent visits to the emergency room for narcotic injections for recurrent headaches. This is been a successful therapy and she has not had an injection in 10 years or more.  Pt states she went to get her Ambien on Monday and when she was leaving the bag split and believes that she may have run over the medication. She notes she needs a new prescription for it. She has no other complaints at this time.  She only needs enough medication to last [place] next refill is due   Current Outpatient Prescriptions on File Prior to Visit  Medication Sig Dispense Refill  . albuterol (PROVENTIL HFA;VENTOLIN HFA) 108 (90 BASE) MCG/ACT inhaler Inhale 2 puffs into the lungs every 6 (six) hours as needed for wheezing or shortness of breath. 1 Inhaler 0  . atorvastatin (LIPITOR) 20 MG tablet Take 1 tablet (20 mg total) by mouth daily. 90 tablet 3  . beclomethasone (QVAR) 80 MCG/ACT inhaler Inhale 1 puff into the lungs 2 (two) times daily. 1 Inhaler 0  . butorphanol (STADOL) 10 MG/ML nasal spray Place 1 spray into the nose every 4 (four) hours as needed for headache. May refill weekly 2.5 mL 5  . HYDROcodone-homatropine (HYCODAN) 5-1.5 MG/5ML syrup Take 5 mLs by mouth every 8 (eight) hours  as needed for cough. 240 mL 0  . levofloxacin (LEVAQUIN) 500 MG tablet Take 1 tablet (500 mg total) by mouth daily. 10 tablet 0  . levothyroxine (SYNTHROID, LEVOTHROID) 75 MCG tablet Take 1 tablet (75 mcg total) by mouth daily. 90 tablet 3  . losartan (COZAAR) 50 MG tablet Take 1 tablet (50 mg total) by mouth daily. 90 tablet 3  . metFORMIN (GLUCOPHAGE) 1000 MG tablet Take 1 tablet (1,000 mg total) by mouth daily with breakfast. 90 tablet 3  . ondansetron (ZOFRAN-ODT) 8 MG disintegrating tablet dissolve 1 tablet under the tongue every 8 hours if needed for nausea 20 tablet 4  . PHENobarbital (LUMINAL) 97.2 MG tablet Take 2 tabs daily on Mon, Wed, and Fri. 1 daily on other days. 135 tablet 3  . promethazine-dextromethorphan (PROMETHAZINE-DM) 6.25-15 MG/5ML syrup Take 5 mLs by mouth 4 (four) times daily as needed for cough. 180 mL 1  . zolpidem (AMBIEN) 10 MG tablet take 1 tablet by mouth at bedtime for sleep if needed 30 tablet 5  . benzonatate (TESSALON PERLES) 100 MG capsule Take 1 capsule (100 mg total) by mouth 3 (three) times daily as needed for cough. (Patient not taking: Reported on 10/12/2014) 30 capsule 1   No current facility-administered medications on file prior to visit.    Review of Systems All systems are negative except as noted in the HPI  and PMH.  Cough is much improved and almost resolved. Her activity level his back to normal.    Objective:   Physical Exam  Constitutional: She is oriented to person, place, and time. She appears well-developed and well-nourished. No distress.  HENT:  Head: Normocephalic and atraumatic.  Eyes: Conjunctivae and EOM are normal.  Neck: Neck supple.  Cardiovascular: Normal rate.   Pulmonary/Chest: Effort normal. No respiratory distress.  Musculoskeletal: Normal range of motion.  Neurological: She is alert and oriented to person, place, and time.  Skin: Skin is warm and dry.  Psychiatric: She has a normal mood and affect. Her behavior is  normal.  Nursing note and vitals reviewed.    Filed Vitals:   10/12/14 1800  BP: 142/80  Pulse: 75  Temp: 99 F (37.2 C)  Resp: 18       Assessment & Plan:  Insomnia  Chronic migraine without aura without status migrainosus, not intractable  Meds ordered this encounter  Medications  . zolpidem (AMBIEN) 10 MG tablet    Sig: Take 1 tablet (10 mg total) by mouth at bedtime as needed for sleep. To replace lost meds til time for next refill    Dispense:  14 tablet    Refill:  1    I have completed the patient encounter in its entirety as documented by the scribe, with editing by me where necessary. Jayson Waterhouse P. Laney Pastor, M.D.

## 2014-10-14 ENCOUNTER — Telehealth: Payer: Self-pay

## 2014-10-14 NOTE — Telephone Encounter (Signed)
PA needed for Stadol again for new plan. Completed on covermymeds and faxed OV notes. See info from last PA done on 02/19/14 phone message. Pending.

## 2014-10-18 ENCOUNTER — Ambulatory Visit (INDEPENDENT_AMBULATORY_CARE_PROVIDER_SITE_OTHER): Payer: Medicare Other | Admitting: Family Medicine

## 2014-10-18 VITALS — BP 144/78 | HR 114 | Temp 100.1°F | Resp 24 | Ht <= 58 in | Wt 152.1 lb

## 2014-10-18 DIAGNOSIS — IMO0002 Reserved for concepts with insufficient information to code with codable children: Secondary | ICD-10-CM

## 2014-10-18 DIAGNOSIS — R350 Frequency of micturition: Secondary | ICD-10-CM

## 2014-10-18 DIAGNOSIS — E1165 Type 2 diabetes mellitus with hyperglycemia: Secondary | ICD-10-CM

## 2014-10-18 DIAGNOSIS — N10 Acute tubulo-interstitial nephritis: Secondary | ICD-10-CM

## 2014-10-18 LAB — POCT UA - MICROSCOPIC ONLY
Casts, Ur, LPF, POC: NEGATIVE
Crystals, Ur, HPF, POC: NEGATIVE
Mucus, UA: NEGATIVE
YEAST UA: NEGATIVE

## 2014-10-18 LAB — POCT URINALYSIS DIPSTICK
Bilirubin, UA: NEGATIVE
GLUCOSE UA: 250
Ketones, UA: NEGATIVE
NITRITE UA: POSITIVE
Spec Grav, UA: 1.02
UROBILINOGEN UA: 0.2
pH, UA: 6

## 2014-10-18 MED ORDER — CEFTRIAXONE SODIUM 1 G IJ SOLR
1.0000 g | Freq: Once | INTRAMUSCULAR | Status: AC
  Administered 2014-10-18: 1 g via INTRAMUSCULAR

## 2014-10-18 MED ORDER — LEVOFLOXACIN 500 MG PO TABS: 500.0000 mg | ORAL_TABLET | Freq: Two times a day (BID) | ORAL | Status: DC

## 2014-10-18 NOTE — Patient Instructions (Addendum)
Please return in 2 days.  If symptoms worsen in the meantime, go to the emergency department   Pyelonephritis, Adult Pyelonephritis is a kidney infection. In general, there are 2 main types of pyelonephritis:  Infections that come on quickly without any warning (acute pyelonephritis).  Infections that persist for a long period of time (chronic pyelonephritis). CAUSES  Two main causes of pyelonephritis are:  Bacteria traveling from the bladder to the kidney. This is a problem especially in pregnant women. The urine in the bladder can become filled with bacteria from multiple causes, including:  Inflammation of the prostate gland (prostatitis).  Sexual intercourse in females.  Bladder infection (cystitis).  Bacteria traveling from the bloodstream to the tissue part of the kidney. Problems that may increase your risk of getting a kidney infection include:  Diabetes.  Kidney stones or bladder stones.  Cancer.  Catheters placed in the bladder.  Other abnormalities of the kidney or ureter. SYMPTOMS   Abdominal pain.  Pain in the side or flank area.  Fever.  Chills.  Upset stomach.  Blood in the urine (dark urine).  Frequent urination.  Strong or persistent urge to urinate.  Burning or stinging when urinating. DIAGNOSIS  Your caregiver may diagnose your kidney infection based on your symptoms. A urine sample may also be taken. TREATMENT  In general, treatment depends on how severe the infection is.   If the infection is mild and caught early, your caregiver may treat you with oral antibiotics and send you home.  If the infection is more severe, the bacteria may have gotten into the bloodstream. This will require intravenous (IV) antibiotics and a hospital stay. Symptoms may include:  High fever.  Severe flank pain.  Shaking chills.  Even after a hospital stay, your caregiver may require you to be on oral antibiotics for a period of time.  Other treatments  may be required depending upon the cause of the infection. HOME CARE INSTRUCTIONS   Take your antibiotics as directed. Finish them even if you start to feel better.  Make an appointment to have your urine checked to make sure the infection is gone.  Drink enough fluids to keep your urine clear or pale yellow.  Take medicines for the bladder if you have urgency and frequency of urination as directed by your caregiver. SEEK IMMEDIATE MEDICAL CARE IF:   You have a fever or persistent symptoms for more than 2-3 days.  You have a fever and your symptoms suddenly get worse.  You are unable to take your antibiotics or fluids.  You develop shaking chills.  You experience extreme weakness or fainting.  There is no improvement after 2 days of treatment. MAKE SURE YOU:  Understand these instructions.  Will watch your condition.  Will get help right away if you are not doing well or get worse. Document Released: 09/17/2005 Document Revised: 03/18/2012 Document Reviewed: 02/21/2011 [facility] Patient Information 2015 [place], [place]. This information is not intended to replace advice given to you by your health care provider. Make sure you discuss any questions you have with your health care provider.

## 2014-10-18 NOTE — Progress Notes (Addendum)
This chart was scribed for Natalie Haber, MD by [name], ED Scribe. This patient was seen in room 10 and the patient's care was started at 6:35 PM.  Patient ID: Natalie [name] MRN: [id number], DOB: Sep 13, 1951, 64 y.o. Date of Encounter: 10/18/2014, 6:35 PM  Primary Physician: [name], MD  Chief Complaint:  Chief Complaint  Patient presents with   Urinary Frequency   Chills     HPI: 64 y.o. year old female with history below. Pt presents to the office today complaining of increased urinary frequency with onset today. She also endorses a subjective fever, left sided back pain, and chills which also started today. Positive headaches. Pt states that she was hospitalized December 1st for a blood stream infection. She states that her symptoms started out like this and gradually worsened because she refused to be seen at the doctors office. Pt is unsure what may be causing her symptoms. Denies any nausea, emesis, abdominal pain, SOB, wheezing, cough, chest pain, numbness, weakness, fatigue, or congestion.  Pt had polynephritis one month ago.   Past Medical History  Diagnosis Date   Hyperlipidemia    Hypertension    Diabetes mellitus without complication      Home Meds: Prior to Admission medications   Medication Sig Start Date End Date Taking? Authorizing Provider  albuterol (PROVENTIL HFA;VENTOLIN HFA) 108 (90 BASE) MCG/ACT inhaler Inhale 2 puffs into the lungs every 6 (six) hours as needed for wheezing or shortness of breath. 09/06/14  Yes Leandrew Koyanagi, MD  atorvastatin (LIPITOR) 20 MG tablet Take 1 tablet (20 mg total) by mouth daily. 09/22/14  Yes Leandrew Koyanagi, MD  butorphanol (STADOL) 10 MG/ML nasal spray Place 1 spray into the nose every 4 (four) hours as needed for headache. May refill weekly 10/03/14  Yes Leandrew Koyanagi, MD  levothyroxine (SYNTHROID, LEVOTHROID) 75 MCG tablet Take 1 tablet (75 mcg total) by mouth daily. 09/22/14  Yes Leandrew Koyanagi, MD  losartan (COZAAR) 50 MG tablet Take 1 tablet (50 mg total) by mouth daily. 10/03/14  Yes Leandrew Koyanagi, MD  metFORMIN (GLUCOPHAGE) 1000 MG tablet Take 1 tablet (1,000 mg total) by mouth daily with breakfast. 09/22/14  Yes Leandrew Koyanagi, MD  ondansetron (ZOFRAN-ODT) 8 MG disintegrating tablet dissolve 1 tablet under the tongue every 8 hours if needed for nausea 03/24/14  Yes Leandrew Koyanagi, MD  PHENobarbital (LUMINAL) 97.2 MG tablet Take 2 tabs daily on Mon, Wed, and Fri. 1 daily on other days. 09/22/14  Yes Leandrew Koyanagi, MD  zolpidem (AMBIEN) 10 MG tablet Take 1 tablet (10 mg total) by mouth at bedtime as needed for sleep. To replace lost meds til time for next refill 10/12/14 11/11/14 Yes Leandrew Koyanagi, MD  beclomethasone (QVAR) 80 MCG/ACT inhaler Inhale 1 puff into the lungs 2 (two) times daily. Patient not taking: Reported on 10/18/2014 10/03/14   Leandrew Koyanagi, MD    Allergies:  Allergies  Allergen Reactions   Compazine    Cymbalta [Duloxetine Hcl]    Escitalopram Oxalate Other (See Comments)    Can't sleep   Methadone Hives   Nitrofuran Derivatives     History   Social History   Marital Status: Married    Spouse Name: N/A    Number of Children: N/A   Years of Education: N/A   Occupational History   Not on file.   Social History Main Topics   Smoking status: Never Smoker  Smokeless tobacco: Not on file   Alcohol Use: No   Drug Use: Not on file   Sexual Activity: Not on file   Other Topics Concern   Not on file   Social History Narrative     Review of Systems:Positive urinary frequency, left sided back pain, chills, and fever Constitutional: negative for chills, fever, night sweats, weight changes, or fatigue  HEENT: negative for vision changes, hearing loss, congestion, rhinorrhea, ST, epistaxis, or sinus pressure Cardiovascular: negative for chest pain or palpitations Respiratory: negative for hemoptysis,  wheezing, shortness of breath, or cough Abdominal: negative for abdominal pain, nausea, vomiting, diarrhea, or constipation Dermatological: negative for rash Neurologic: negative for headache, dizziness, or syncope All other systems reviewed and are otherwise negative with the exception to those above and in the HPI.   Physical Exam:tenderness noted to left flank. Abdomen is soft without guarding or rebound. Rapid heart rate, regular rhythm.   Blood pressure 144/78, pulse 114, temperature 100.1 F (37.8 C), temperature source Oral, resp. rate 24, height 4' 8.5" (1.435 m), weight 152 lb 2 oz (69.003 kg), SpO2 96 %., Body mass index is 33.51 kg/(m^2). General: Well developed, well nourished, in no acute distress. Head: Normocephalic, atraumatic, eyes without discharge, sclera non-icteric, nares are without discharge. Bilateral auditory canals clear, TM's are without perforation, pearly grey and translucent with reflective cone of light bilaterally. Oral cavity moist, posterior pharynx without exudate, erythema, peritonsillar abscess, or post nasal drip.  Neck: Supple. No thyromegaly. Full ROM. No lymphadenopathy. Lungs: Clear bilaterally to auscultation without wheezes, rales, or rhonchi. Breathing is unlabored. Heart: RRR with S1 S2. No murmurs, rubs, or gallops appreciated. Abdomen: Soft, non-tender, non-distended with normoactive bowel sounds. No hepatomegaly. No rebound/guarding. No obvious abdominal masses. Msk:  Strength and tone normal for age. Extremities/Skin: Warm and dry. No clubbing or cyanosis. No edema. No rashes or suspicious lesions. Neuro: Alert and oriented X 3. Moves all extremities spontaneously. Gait is normal. CNII-XII grossly in tact. Psych:  Responds to questions appropriately with a normal affect.   Labs: Results for orders placed or performed in visit on 10/18/14  POCT UA - Microscopic Only  Result Value Ref Range   WBC, Ur, HPF, POC TNTC    RBC, urine, microscopic  15-25    Bacteria, U Microscopic 1+    Mucus, UA neg    Epithelial cells, urine per micros 2-4    Crystals, Ur, HPF, POC neg    Casts, Ur, LPF, POC neg    Yeast, UA neg   POCT urinalysis dipstick  Result Value Ref Range   Color, UA yellow    Clarity, UA cloudy    Glucose, UA 250    Bilirubin, UA neg    Ketones, UA neg    Spec Grav, UA 1.020    Blood, UA moderate    pH, UA 6.0    Protein, UA >=300    Urobilinogen, UA 0.2    Nitrite, UA positive    Leukocytes, UA large (3+)      ASSESSMENT AND PLAN:  64 y.o. year old female with  This chart was scribed in my presence and reviewed by me personally.    ICD-9-CM ICD-10-CM   1. Urinary frequency 788.41 R35.0 POCT UA - Microscopic Only     POCT urinalysis dipstick     cefTRIAXone (ROCEPHIN) injection 1 g     levofloxacin (LEVAQUIN) 500 MG tablet  2. Acute pyelonephritis 590.10 N10 POCT CBC     Urine culture  Comprehensive metabolic panel     cefTRIAXone (ROCEPHIN) injection 1 g     levofloxacin (LEVAQUIN) 500 MG tablet   Recheck 48 hours  Signed, Natalie Haber, MD    Signed, Natalie Haber, MD 10/18/2014 6:35 PM

## 2014-10-18 NOTE — Telephone Encounter (Signed)
PA approved through 10/15/15. Notified pt.

## 2014-10-19 LAB — COMPREHENSIVE METABOLIC PANEL
ALT: 8 U/L (ref 0–35)
AST: 11 U/L (ref 0–37)
Albumin: 4 g/dL (ref 3.5–5.2)
Alkaline Phosphatase: 78 U/L (ref 39–117)
BUN: 16 mg/dL (ref 6–23)
CO2: 22 mEq/L (ref 19–32)
Calcium: 9.7 mg/dL (ref 8.4–10.5)
Chloride: 102 mEq/L (ref 96–112)
Creat: 1.09 mg/dL (ref 0.50–1.10)
Glucose, Bld: 260 mg/dL — ABNORMAL HIGH (ref 70–99)
Potassium: 5 mEq/L (ref 3.5–5.3)
Sodium: 135 mEq/L (ref 135–145)
Total Bilirubin: 0.3 mg/dL (ref 0.2–1.2)
Total Protein: 7.7 g/dL (ref 6.0–8.3)

## 2014-10-21 LAB — URINE CULTURE: Colony Count: >=100000

## 2014-10-25 ENCOUNTER — Encounter: Payer: Self-pay | Admitting: Internal Medicine

## 2014-11-02 NOTE — Telephone Encounter (Signed)
Pt came by 102 and reported pharm is having trouble getting Rx to go through. [facility] back [phone], ID # [id number] and they "reset" the PA that had been approved and got paid test claims for 2.5 ml Q7days. Notified pharm and pt.

## 2014-11-16 ENCOUNTER — Ambulatory Visit (INDEPENDENT_AMBULATORY_CARE_PROVIDER_SITE_OTHER): Payer: Medicare Other | Admitting: Internal Medicine

## 2014-11-16 VITALS — BP 150/80 | HR 79 | Temp 98.0°F | Resp 16 | Ht <= 58 in | Wt 152.0 lb

## 2014-11-16 DIAGNOSIS — N390 Urinary tract infection, site not specified: Secondary | ICD-10-CM | POA: Diagnosis not present

## 2014-11-16 DIAGNOSIS — G43719 Chronic migraine without aura, intractable, without status migrainosus: Secondary | ICD-10-CM

## 2014-11-16 MED ORDER — BUTORPHANOL TARTRATE 10 MG/ML NA SOLN: 1.0000 | NASAL | Status: DC | PRN
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
## 2014-11-30 ENCOUNTER — Ambulatory Visit (INDEPENDENT_AMBULATORY_CARE_PROVIDER_SITE_OTHER): Payer: Medicare Other | Admitting: Internal Medicine

## 2014-11-30 VITALS — BP 154/78 | HR 122 | Temp 102.8°F | Resp 20 | Ht <= 58 in | Wt 153.5 lb

## 2014-11-30 DIAGNOSIS — R509 Fever, unspecified: Secondary | ICD-10-CM | POA: Diagnosis not present

## 2014-11-30 DIAGNOSIS — R350 Frequency of micturition: Secondary | ICD-10-CM | POA: Diagnosis not present

## 2014-11-30 DIAGNOSIS — N10 Acute tubulo-interstitial nephritis: Secondary | ICD-10-CM | POA: Diagnosis not present

## 2014-11-30 LAB — POCT CBC
Granulocyte percent: 82.5 %G — AB (ref 37–80)
HCT, POC: 32.8 % — AB (ref 37.7–47.9)
Hemoglobin: 11.1 g/dL — AB (ref 12.2–16.2)
LYMPH, POC: 1.7 (ref 0.6–3.4)
MCH: 31.3 pg — AB (ref 27–31.2)
MCHC: 34 g/dL (ref 31.8–35.4)
MCV: 92.1 fL (ref 80–97)
MID (cbc): 1 — AB (ref 0–0.9)
MPV: 6.8 fL (ref 0–99.8)
PLATELET COUNT, POC: 365 10*3/uL (ref 142–424)
POC Granulocyte: 12.7 — AB (ref 2–6.9)
POC LYMPH PERCENT: 11.2 %L (ref 10–50)
POC MID %: 6.3 %M (ref 0–12)
RBC: 3.56 M/uL — AB (ref 4.04–5.48)
RDW, POC: 15 %
WBC: 15.4 10*3/uL — AB (ref 4.6–10.2)

## 2014-11-30 LAB — POCT URINALYSIS DIPSTICK
BILIRUBIN UA: NEGATIVE
Glucose, UA: 100
Ketones, UA: 15
NITRITE UA: NEGATIVE
Spec Grav, UA: 1.025
Urobilinogen, UA: 0.2
pH, UA: 5

## 2014-11-30 LAB — POCT UA - MICROSCOPIC ONLY
CRYSTALS, UR, HPF, POC: NEGATIVE
Casts, Ur, LPF, POC: NEGATIVE
Mucus, UA: NEGATIVE
Yeast, UA: NEGATIVE

## 2014-11-30 MED ORDER — CEFTRIAXONE SODIUM 1 G IJ SOLR: 1.0000 g | Freq: Once | INTRAMUSCULAR | Status: DC

## 2014-11-30 MED ORDER — ACETAMINOPHEN 325 MG PO TABS
325.0000 mg | ORAL_TABLET | Freq: Once | ORAL | Status: AC
Start: 2014-11-30 — End: 2014-11-30
  Administered 2014-11-30: 325 mg via ORAL

## 2014-11-30 MED ORDER — LEVOFLOXACIN 750 MG PO TABS
750.0000 mg | ORAL_TABLET | Freq: Every day | ORAL | Status: DC
Start: 2014-11-30 — End: 2015-04-03

## 2014-11-30 MED ORDER — CEFTRIAXONE SODIUM 1 G IJ SOLR
1.0000 g | Freq: Once | INTRAMUSCULAR | Status: AC
  Administered 2014-11-30: 1 g via INTRAMUSCULAR

## 2014-11-30 NOTE — Progress Notes (Signed)
This chart was scribed for Natalie Lin, MD by Natalie [name], ED Scribe. This patient was seen in room 10 and the patient's care was started at 7:16 PM.  Subjective:    Patient ID: Natalie [name], female    DOB: [birth year], 64 y.o.   MRN: [id number]  Chief Complaint  Patient presents with  . Fever    UTI?    HPI [name] is a 64 y.o. female with PMhx of UTI, weakness, AKI, dM, HTN, hypothyroidism, HLD, migraine, insomnia, and seizure disorder.   Pt presents to the office complaining of a possible UTI. She is here complaining of a sudden onset fever with no measured TMAX that started this afternoon. Current office temperature is 102.8. She reports taking some OTC medications but it was much earlier during the day. Pt is also complaining of associated increased urinary frequency and upper back pain. She has chills. She has sweats. She has mild nausea but no vomiting.  C history of recurrent urinary tract infections with residual stones and also recurrent episodes of pyelonephritis. She was hospitalized in December 2015 for urosepsis with hypotension. She had her last infection in January 2016 due to Klebsiella sensitive to everything.  Pt denies any neck pain, sore throat, visual disturbance, CP, cough, SOB, abdominal pain,  emesis, diarrhea, HA, weakness, numbness and rash as associated symptoms.    Patient Active Problem List   Diagnosis Date Noted  . Dehydration   . Abdominal pain   . AKI (acute kidney injury)   . Weakness   . UTI (lower urinary tract infection) 08/31/2014  . Pyelonephritis 08/31/2014  . 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 BMI 33.0-33.9,adult 03/30/2013  . Dermatofibroma 12/03/2012  . Recurrent UTI 09/17/2012  . DM (diabetes mellitus) 03/19/2012  . HTN (hypertension) 03/19/2012  . Hyperlipemia 03/19/2012  . Hypothyroidism 03/19/2012  . Seizure disorder 03/19/2012  . Insomnia 03/19/2012  . Migraine  03/19/2012   Past Medical History  Diagnosis Date  . Hyperlipidemia   . Hypertension   . Diabetes mellitus without complication    Past Surgical History  Procedure Laterality Date  . Cholecystectomy    . Cesarean section     Allergies  Allergen Reactions  . Compazine   . Cymbalta [Duloxetine Hcl]   . Escitalopram Oxalate Other (See Comments)    Can't sleep  . Methadone Hives  . Nitrofuran Derivatives    Prior to Admission medications   Medication Sig Start Date End Date Taking? Authorizing Provider  albuterol (PROVENTIL HFA;VENTOLIN HFA) 108 (90 BASE) MCG/ACT inhaler Inhale 2 puffs into the lungs every 6 (six) hours as needed for wheezing or shortness of breath. 09/06/14  Yes Leandrew Koyanagi, MD  atorvastatin (LIPITOR) 20 MG tablet Take 1 tablet (20 mg total) by mouth daily. 09/22/14  Yes Leandrew Koyanagi, MD  beclomethasone (QVAR) 80 MCG/ACT inhaler Inhale 1 puff into the lungs 2 (two) times daily. 10/03/14  Yes Leandrew Koyanagi, MD  butorphanol (STADOL) 10 MG/ML nasal spray Place 1 spray into the nose every 4 (four) hours as needed for headache. May refill weekly 11/16/14  Yes Leandrew Koyanagi, MD  levothyroxine (SYNTHROID, LEVOTHROID) 75 MCG tablet Take 1 tablet (75 mcg total) by mouth daily. 09/22/14  Yes Leandrew Koyanagi, MD  losartan (COZAAR) 50 MG tablet Take 1 tablet (50 mg total) by mouth daily. 10/03/14  Yes Leandrew Koyanagi, MD  metFORMIN (GLUCOPHAGE) 1000 MG tablet Take 1 tablet (1,000 mg total) by  mouth daily with breakfast. 09/22/14  Yes Leandrew Koyanagi, MD  ondansetron (ZOFRAN-ODT) 8 MG disintegrating tablet dissolve 1 tablet under the tongue every 8 hours if needed for nausea 03/24/14  Yes Leandrew Koyanagi, MD  PHENobarbital (LUMINAL) 97.2 MG tablet Take 2 tabs daily on Mon, Wed, and Fri. 1 daily on other days. 09/22/14  Yes Leandrew Koyanagi, MD  zolpidem (AMBIEN) 10 MG tablet Take 1 tablet (10 mg total) by mouth at bedtime as needed for sleep. To replace  lost meds til time for next refill 10/12/14 11/16/14  Leandrew Koyanagi, MD   History   Social History  . Marital Status: Married    Spouse Name: N/A  . Number of Children: N/A  . Years of Education: N/A   Occupational History  . Not on file.   Social History Main Topics  . Smoking status: Never Smoker   . Smokeless tobacco: Never Used  . Alcohol Use: No  . Drug Use: Not on file  . Sexual Activity: Not on file   Other Topics Concern  . Not on file   Social History Narrative    Review of Systems  Constitutional: Positive for fever and chills.  HENT: Negative for congestion and ear pain.   Respiratory: Negative for shortness of breath.   Cardiovascular: Negative for chest pain.  Gastrointestinal: Negative for nausea, vomiting, abdominal pain and diarrhea.  Genitourinary: Positive for frequency.  Skin: Negative for rash.  Neurological: Negative for weakness, numbness and headaches.   Objective:   Physical Exam  Constitutional: She is oriented to person, place, and time. She appears well-developed and well-nourished. She appears distressed.  Looks ill with a  flushed face  HENT:  Head: Normocephalic and atraumatic.  Mouth/Throat: Oropharynx is clear and moist. No oropharyngeal exudate.  Eyes: Conjunctivae and EOM are normal. Pupils are equal, round, and reactive to light. Right eye exhibits no discharge. Left eye exhibits no discharge.  Neck: Normal range of motion. Neck supple. No thyromegaly present.  Cardiovascular: Normal rate, regular rhythm and normal heart sounds.  Exam reveals no gallop and no friction rub.   No murmur heard. Pulmonary/Chest: Effort normal and breath sounds normal. No respiratory distress. She has no wheezes. She has no rales.  Abdominal: Soft. She exhibits no distension. There is no tenderness.  No CVA tenderness to percussion  Musculoskeletal: She exhibits no edema or tenderness.  Lymphadenopathy:    She has no cervical adenopathy.    Neurological: She is alert and oriented to person, place, and time. She has normal reflexes. No cranial nerve deficit.  Skin: Skin is warm and dry.  Psychiatric: She has a normal mood and affect. Her behavior is normal. Thought content normal.  Nursing note and vitals reviewed.  BP 154/78 mmHg  Pulse 122  Temp(Src) 102.8 F (39.3 C) (Oral)  Resp 20  Ht 4\' 8"  (1.422 m)  Wt 153 lb 8 oz (69.627 kg)  BMI 34.43 kg/m2  SpO2 98%  Results for orders placed or performed in visit on 11/30/14  POCT urinalysis dipstick  Result Value Ref Range   Color, UA yellow    Clarity, UA slightly cloudy    Glucose, UA 100    Bilirubin, UA neg    Ketones, UA 15    Spec Grav, UA 1.025    Blood, UA moderate    pH, UA 5.0    Protein, UA >=300    Urobilinogen, UA 0.2    Nitrite, UA neg  Leukocytes, UA small (1+)   POCT CBC  Result Value Ref Range   WBC 15.4 (A) 4.6 - 10.2 K/uL   Lymph, poc 1.7 0.6 - 3.4   POC LYMPH PERCENT 11.2 10 - 50 %L   MID (cbc) 1.0 (A) 0 - 0.9   POC MID % 6.3 0 - 12 %M   POC Granulocyte 12.7 (A) 2 - 6.9   Granulocyte percent 82.5 (A) 37 - 80 %G   RBC 3.56 (A) 4.04 - 5.48 M/uL   Hemoglobin 11.1 (A) 12.2 - 16.2 g/dL   HCT, POC 32.8 (A) 37.7 - 47.9 %   MCV 92.1 80 - 97 fL   MCH, POC 31.3 (A) 27 - 31.2 pg   MCHC 34.0 31.8 - 35.4 g/dL   RDW, POC 15.0 %   Platelet Count, POC 365 142 - 424 K/uL   MPV 6.8 0 - 99.8 fL  POCT UA - Microscopic Only  Result Value Ref Range   WBC, Ur, HPF, POC 5-7    RBC, urine, microscopic 3-5    Bacteria, U Microscopic trace    Mucus, UA neg    Epithelial cells, urine per micros 0-2    Crystals, Ur, HPF, POC neg    Casts, Ur, LPF, POC neg    Yeast, UA neg     Assessment & Plan:  Acute pyelonephritis is very likely in this woman with severe recurrent urinary tract infections. She was recently hospitalized December 2015 for urosepsis with hypotension and following that had another infection in January with Klebsiella treated as an  outpatient.  Urine frequency - Plan:  Urine culture,  Fever, unspecified fever cause - Plan:  Comprehensive metabolic panel, Urine culture, acetaminophen (TYLENOL) tablet 325 mg, cefTRIAXone (ROCEPHIN) injection 1 g  Meds ordered this encounter  Medications  . acetaminophen (TYLENOL) tablet 325 mg    Sig:   . cefTRIAXone (ROCEPHIN) 1 G injection    Sig: Inject 1 g into the muscle once.    Dispense:  1 each    Refill:  0  . levofloxacin (LEVAQUIN) 750 MG tablet    Sig: Take 1 tablet (750 mg total) by mouth daily.    Dispense:  10 tablet    Refill:  0   Close follow-up necessary Push fluids She has antinausea medicine Follow-up in 24 hours if not responding   I have completed the patient encounter in its entirety as documented by the scribe, with editing by me where necessary. Jerlyn Pain P. Laney Pastor, M.D.

## 2014-12-01 LAB — COMPREHENSIVE METABOLIC PANEL
ALK PHOS: 74 U/L (ref 39–117)
ALT: <8 U/L (ref 0–35)
AST: 11 U/L (ref 0–37)
Albumin: 4 g/dL (ref 3.5–5.2)
BILIRUBIN TOTAL: 0.4 mg/dL (ref 0.2–1.2)
BUN: 18 mg/dL (ref 6–23)
CO2: 21 mEq/L (ref 19–32)
CREATININE: 1.22 mg/dL — AB (ref 0.50–1.10)
Calcium: 9.6 mg/dL (ref 8.4–10.5)
Chloride: 100 mEq/L (ref 96–112)
GLUCOSE: 240 mg/dL — AB (ref 70–99)
Potassium: 4.3 mEq/L (ref 3.5–5.3)
Sodium: 132 mEq/L — ABNORMAL LOW (ref 135–145)
TOTAL PROTEIN: 7.4 g/dL (ref 6.0–8.3)

## 2014-12-02 LAB — URINE CULTURE
COLONY COUNT: NO GROWTH
Organism ID, Bacteria: NO GROWTH

## 2014-12-20 ENCOUNTER — Telehealth: Payer: Self-pay

## 2014-12-20 MED ORDER — BUTORPHANOL TARTRATE 10 MG/ML NA SOLN: 1.0000 | NASAL | Status: DC | PRN

## 2014-12-20 NOTE — Telephone Encounter (Signed)
Notified pt. 

## 2014-12-20 NOTE — Telephone Encounter (Signed)
Pt requesting refill on her Stadol   Best phone for pt is [phone]

## 2014-12-20 NOTE — Telephone Encounter (Signed)
Please advise 

## 2015-01-25 ENCOUNTER — Other Ambulatory Visit: Payer: Self-pay

## 2015-01-25 MED ORDER — BUTORPHANOL TARTRATE 10 MG/ML NA SOLN: 1.0000 | NASAL | Status: DC | PRN

## 2015-01-25 NOTE — Telephone Encounter (Signed)
Patient request a refill on Stadol 10 MG.

## 2015-01-28 ENCOUNTER — Other Ambulatory Visit: Payer: Self-pay

## 2015-01-28 MED ORDER — BUTORPHANOL TARTRATE 10 MG/ML NA SOLN: 1.0000 | NASAL | Status: DC | PRN

## 2015-01-28 NOTE — Telephone Encounter (Signed)
Rx ready to pick up.

## 2015-03-02 ENCOUNTER — Telehealth: Payer: Self-pay

## 2015-03-02 NOTE — Telephone Encounter (Signed)
Pt would like a refill on her butorphanol (STADOL) 10 MG/ML nasal spray [id number] prescription. Please advise at [phone]

## 2015-03-02 NOTE — Telephone Encounter (Signed)
Please route to any PA working this PM Ok to fill rx as written last time

## 2015-03-02 NOTE — Telephone Encounter (Signed)
Per dr Laney Pastor: refill

## 2015-03-02 NOTE — Telephone Encounter (Signed)
Looks like he still has 5 refills left.

## 2015-03-03 NOTE — Telephone Encounter (Signed)
butorphanol (STADOL) 10 MG/ML nasal spray [id number]      Order Details    Dose: 1 spray Route: Nasal Frequency: Every 4 hours PRN for headache   Dispense Quantity:  2.5 mL Refills:  5 Fills Remaining:  5          Sig: Place 1 spray into the nose every 4 (four) hours as needed for headache. May refill weekly              Pt does have refills. Called her and left a message to call her pharmacy.

## 2015-03-04 ENCOUNTER — Telehealth: Payer: Self-pay

## 2015-03-04 DIAGNOSIS — G43809 Other migraine, not intractable, without status migrainosus: Secondary | ICD-10-CM

## 2015-03-04 MED ORDER — BUTORPHANOL TARTRATE 10 MG/ML NA SOLN: 1.0000 | NASAL | Status: DC | PRN

## 2015-03-04 NOTE — Telephone Encounter (Signed)
Refill on Stadol. Pt is here in the waiting room.

## 2015-03-04 NOTE — Telephone Encounter (Signed)
Rx refilled for 1 week. She must return to see DR. DOOLITTLE for further refills. Pt has gone through 5 refills in 2 months.

## 2015-03-07 ENCOUNTER — Encounter: Payer: Self-pay | Admitting: *Deleted

## 2015-03-09 ENCOUNTER — Ambulatory Visit (INDEPENDENT_AMBULATORY_CARE_PROVIDER_SITE_OTHER): Payer: Medicare Other | Admitting: Internal Medicine

## 2015-03-09 ENCOUNTER — Encounter: Payer: Self-pay | Admitting: Internal Medicine

## 2015-03-09 VITALS — BP 150/119 | HR 72 | Temp 98.5°F | Resp 16 | Ht <= 58 in | Wt 154.0 lb

## 2015-03-09 DIAGNOSIS — N390 Urinary tract infection, site not specified: Secondary | ICD-10-CM

## 2015-03-09 DIAGNOSIS — I10 Essential (primary) hypertension: Secondary | ICD-10-CM | POA: Diagnosis not present

## 2015-03-09 DIAGNOSIS — G4700 Insomnia, unspecified: Secondary | ICD-10-CM

## 2015-03-09 DIAGNOSIS — G43709 Chronic migraine without aura, not intractable, without status migrainosus: Secondary | ICD-10-CM

## 2015-03-09 DIAGNOSIS — E1129 Type 2 diabetes mellitus with other diabetic kidney complication: Secondary | ICD-10-CM | POA: Diagnosis not present

## 2015-03-09 LAB — POCT GLYCOSYLATED HEMOGLOBIN (HGB A1C): HEMOGLOBIN A1C: 8.6

## 2015-03-09 MED ORDER — ZOLPIDEM TARTRATE 10 MG PO TABS: ORAL_TABLET | ORAL | Status: DC

## 2015-03-09 MED ORDER — BUTORPHANOL TARTRATE 10 MG/ML NA SOLN: 1.0000 | NASAL | Status: DC | PRN

## 2015-03-09 NOTE — Progress Notes (Signed)
Subjective:    Patient ID: Natalie [name], female    DOB: Aug 30, 1951, 64 y.o.   MRN: [id number]  HPI Patient Active Problem List   Diagnosis Date Noted  . AKI (acute kidney injury)--- resolved    . UTI (lower urinary tract infection)--- recurrent  08/31/2014  . Pyelonephritis--- recurrent  08/31/2014  . 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 BMI 33.0-33.9,adult 03/30/2013  . DM (diabetes mellitus) 03/19/2012  . HTN (hypertension) 03/19/2012  . Hyperlipemia 03/19/2012  . Hypothyroidism 03/19/2012  . Seizure disorder 03/19/2012  . Insomnia 03/19/2012  . Migraine--- intractable  03/19/2012    Current outpatient prescriptions:  .  atorvastatin (LIPITOR) 20 MG tablet, Take 1 tablet (20 mg total) by mouth daily., Disp: 90 tablet, Rfl: 3 .  butorphanol (STADOL) 10 MG/ML nasal spray, Place 1 spray into the nose every 4 (four) hours as needed for headache. May refill weekly, Disp: 2.5 mL, Rfl: 0 .  levothyroxine (SYNTHROID, LEVOTHROID) 75 MCG tablet, Take 1 tablet (75 mcg total) by mouth daily., Disp: 90 tablet, Rfl: 3 .  metFORMIN (GLUCOPHAGE) 1000 MG tablet, Take 1 tablet (1,000 mg total) by mouth daily with breakfast., Disp: 90 tablet, Rfl: 3 .  PHENobarbital (LUMINAL) 97.2 MG tablet, Take 2 tabs daily on Mon, Wed, and Fri. 1 daily on other days., Disp: 135 tablet, Rfl: 3 .  albuterol (PROVENTIL HFA;VENTOLIN HFA) 108 (90 BASE) MCG/ACT inhaler, Inhale 2 puffs into the lungs every 6 (six) hours as needed for wheezing or shortness of breath. (Patient not taking: Reported on 03/09/2015), Disp: 1 Inhaler, Rfl: 0 .  beclomethasone (QVAR) 80 MCG/ACT inhaler, Inhale 1 puff into the lungs 2 (two) times daily. (Patient not taking: Reported on 03/09/2015), Disp: 1 Inhaler, Rfl: 0 .  levofloxacin (LEVAQUIN) 750 MG tablet, Take 1 tablet (750 mg total) by mouth daily. (Patient not taking: Reported on 03/09/2015), Disp: 10 tablet, Rfl: 0 .  losartan (COZAAR) 50 MG  tablet, Take 1 tablet (50 mg total) by mouth daily. (Patient not taking: Reported on 03/09/2015), Disp: 90 tablet, Rfl: 3 .  ondansetron (ZOFRAN-ODT) 8 MG disintegrating tablet, dissolve 1 tablet under the tongue every 8 hours if needed for nausea (Patient not taking: Reported on 03/09/2015), Disp: 20 tablet, Rfl: 4 .  zolpidem (AMBIEN) 10 MG tablet, Take 1 tablet (10 mg total) by mouth at bedtime as needed for sleep. To replace lost meds til time for next refill, Disp: 14 tablet, Rfl: 1   Since her last acute pyelonephritis she has started Cranberry pills and has not had another infection Headaches remaining controlled somewhat with Stadol on a frequent basis. This at least prevents multiple emergency room visits and allows her to have a more normal life. She is happy with his therapy.  Review of Systems  Constitutional: Negative for fever, fatigue and unexpected weight change.  HENT: Negative for trouble swallowing.   Eyes: Negative for visual disturbance.  Respiratory: Negative for shortness of breath.   Cardiovascular: Negative for chest pain, palpitations and leg swelling.  Gastrointestinal: Negative for abdominal pain.  Neurological: Negative for dizziness, weakness and numbness.  Psychiatric/Behavioral: Negative for hallucinations, behavioral problems, sleep disturbance and decreased concentration.       Objective:   Physical Exam BP 150/119 mmHg  Pulse 72  Temp(Src) 98.5 F (36.9 C) (Oral)  Resp 16  Ht 4' 9.75" (1.467 m)  Wt 154 lb (69.854 kg)  BMI 32.46 kg/m2  SpO2 97% HEENT clear No thyromegaly Heart regular  No peripheral edema Neurological intact Mood good and affect appropriate      Assessment & Plan:  Type 2 diabetes mellitus with other diabetic kidney complication - Plan: POCT glycosylated hemoglobin (Hb A1C), Comprehensive metabolic panel, Lipid panel, CBC with Differential/Platelet, Microalbumin, urine  Essential hypertension--currently uncontrolled//to consider  increase medications///metabolic profile to check renal status  Follow home blood pressures more closely and call if not controlled//on Cozaar 50  Chronic migraine without aura without status migrainosus, almost intractable--controlled by stadol  Insomnia--- response to Ambien  Recurrent UTI--- stable for now  Meds ordered this encounter  Medications  . butorphanol (STADOL) 10 MG/ML nasal spray    Sig: Place 1 spray into the nose every 4 (four) hours as needed for headache. May refill weekly    Dispense:  2.5 mL    Refill:  5  . zolpidem (AMBIEN) 10 MG tablet    Sig: take 1 tablet by mouth at bedtime for sleep if needed    Dispense:  30 tablet    Refill:  5

## 2015-03-10 ENCOUNTER — Encounter: Payer: Self-pay | Admitting: Internal Medicine

## 2015-03-10 LAB — COMPREHENSIVE METABOLIC PANEL
ALK PHOS: 89 U/L (ref 39–117)
ALT: 8 U/L (ref 0–35)
AST: 12 U/L (ref 0–37)
Albumin: 4 g/dL (ref 3.5–5.2)
BUN: 23 mg/dL (ref 6–23)
CHLORIDE: 104 meq/L (ref 96–112)
CO2: 22 mEq/L (ref 19–32)
CREATININE: 1.16 mg/dL — AB (ref 0.50–1.10)
Calcium: 9.4 mg/dL (ref 8.4–10.5)
Glucose, Bld: 162 mg/dL — ABNORMAL HIGH (ref 70–99)
Potassium: 5.1 mEq/L (ref 3.5–5.3)
Sodium: 140 mEq/L (ref 135–145)
TOTAL PROTEIN: 7 g/dL (ref 6.0–8.3)
Total Bilirubin: 0.2 mg/dL (ref 0.2–1.2)

## 2015-03-10 LAB — LIPID PANEL
Cholesterol: 178 mg/dL (ref 0–200)
HDL: 60 mg/dL (ref >=46)
LDL Cholesterol: 74 mg/dL (ref 0–99)
Total CHOL/HDL Ratio: 3 Ratio
Triglycerides: 219 mg/dL — ABNORMAL HIGH (ref <150)
VLDL: 44 mg/dL — AB (ref 0–40)

## 2015-03-10 LAB — CBC WITH DIFFERENTIAL/PLATELET
Basophils Absolute: 0 10*3/uL (ref 0.0–0.1)
Basophils Relative: 0 % (ref 0–1)
Eosinophils Absolute: 0.2 10*3/uL (ref 0.0–0.7)
Eosinophils Relative: 3 % (ref 0–5)
HCT: 35.9 % — ABNORMAL LOW (ref 36.0–46.0)
Hemoglobin: 11.5 g/dL — ABNORMAL LOW (ref 12.0–15.0)
LYMPHS PCT: 31 % (ref 12–46)
Lymphs Abs: 2.4 10*3/uL (ref 0.7–4.0)
MCH: 29.1 pg (ref 26.0–34.0)
MCHC: 32 g/dL (ref 30.0–36.0)
MCV: 90.9 fL (ref 78.0–100.0)
MPV: 9.9 fL (ref 8.6–12.4)
Monocytes Absolute: 0.7 10*3/uL (ref 0.1–1.0)
Monocytes Relative: 9 % (ref 3–12)
NEUTROS PCT: 57 % (ref 43–77)
Neutro Abs: 4.3 10*3/uL (ref 1.7–7.7)
Platelets: 307 10*3/uL (ref 150–400)
RBC: 3.95 MIL/uL (ref 3.87–5.11)
RDW: 16.2 % — AB (ref 11.5–15.5)
WBC: 7.6 10*3/uL (ref 4.0–10.5)

## 2015-03-10 LAB — MICROALBUMIN, URINE: Microalb, Ur: 46.5 mg/dL — ABNORMAL HIGH (ref <2.0)

## 2015-04-03 ENCOUNTER — Ambulatory Visit (INDEPENDENT_AMBULATORY_CARE_PROVIDER_SITE_OTHER): Payer: Medicare Other | Admitting: Internal Medicine

## 2015-04-03 VITALS — BP 124/64 | HR 67 | Temp 97.7°F | Resp 16 | Ht <= 58 in | Wt 160.0 lb

## 2015-04-03 DIAGNOSIS — K651 Peritoneal abscess: Secondary | ICD-10-CM | POA: Diagnosis not present

## 2015-04-03 DIAGNOSIS — IMO0002 Reserved for concepts with insufficient information to code with codable children: Secondary | ICD-10-CM

## 2015-04-03 MED ORDER — DOXYCYCLINE HYCLATE 100 MG PO TABS: 100.0000 mg | ORAL_TABLET | Freq: Two times a day (BID) | ORAL | Status: DC

## 2015-04-03 NOTE — Progress Notes (Signed)
Subjective:  This chart was scribed for Natalie Lin, MD by [facility], medical scribe at [facility].The patient was seen in exam room 10 and the patient's care was started at 2:30 PM.    Patient ID: Natalie [name], female    DOB: 1950-11-09, 64 y.o.   MRN: [id number] Chief Complaint  Patient presents with  . Recurrent Skin Infections    boil to her lower abd area x 4 DAYS. very tender to touch,    HPI HPI Comments: Hli Rent is a 64 y.o. female who presents to [facility] complaining of abscess on her RLQ abdomen, onset four days ago. Her daughter did lance the abscess then dress it prior coming in. She does not have much feeling around the abscess.    Past Medical History  Diagnosis Date  . Hyperlipidemia   . Hypertension   . Diabetes mellitus without complication    Prior to Admission medications   Medication Sig Start Date End Date Taking? Authorizing Provider  atorvastatin (LIPITOR) 20 MG tablet Take 1 tablet (20 mg total) by mouth daily. 09/22/14  Yes Leandrew Koyanagi, MD  butorphanol (STADOL) 10 MG/ML nasal spray Place 1 spray into the nose every 4 (four) hours as needed for headache. May refill weekly 03/09/15  Yes Leandrew Koyanagi, MD  levothyroxine (SYNTHROID, LEVOTHROID) 75 MCG tablet Take 1 tablet (75 mcg total) by mouth daily. 09/22/14  Yes Leandrew Koyanagi, MD  metFORMIN (GLUCOPHAGE) 1000 MG tablet Take 1 tablet (1,000 mg total) by mouth daily with breakfast. 09/22/14  Yes Leandrew Koyanagi, MD  PHENobarbital (LUMINAL) 97.2 MG tablet Take 2 tabs daily on Mon, Wed, and Fri. 1 daily on other days. 09/22/14  Yes Leandrew Koyanagi, MD  zolpidem (AMBIEN) 10 MG tablet take 1 tablet by mouth at bedtime for sleep if needed 03/09/15  Yes Leandrew Koyanagi, MD  beclomethasone (QVAR) 80 MCG/ACT inhaler Inhale 1 puff into the lungs 2 (two) times daily. Patient not taking: Reported on 03/09/2015 10/03/14   Leandrew Koyanagi, MD    levofloxacin (LEVAQUIN) 750 MG tablet Take 1 tablet (750 mg total) by mouth daily. Patient not taking: Reported on 03/09/2015 11/30/14   Leandrew Koyanagi, MD  losartan (COZAAR) 50 MG tablet Take 1 tablet (50 mg total) by mouth daily. Patient not taking: Reported on 03/09/2015 10/03/14  she indicates that she is on lisinopril and metoprolol   Leandrew Koyanagi, MD  ondansetron (ZOFRAN-ODT) 8 MG disintegrating tablet dissolve 1 tablet under the tongue every 8 hours if needed for nausea Patient not taking: Reported on 03/09/2015 03/24/14   Leandrew Koyanagi, MD   Allergies  Allergen Reactions  . Compazine   . Cymbalta [Duloxetine Hcl]   . Escitalopram Oxalate Other (See Comments)    Can't sleep  . Methadone Hives  . Nitrofuran Derivatives    Review of Systems  Skin: Positive for color change.       Positive for abscess.       Objective:  BP 124/64 mmHg  Pulse 67  Temp(Src) 97.7 F (36.5 C) (Oral)  Resp 16  Ht 4' 8.5" (1.435 m)  Wt 160 lb (72.576 kg)  BMI 35.24 kg/m2  SpO2 98% Physical Exam  Constitutional: She is oriented to person, place, and time. She appears well-developed and well-nourished. No distress.  HENT:  Head: Normocephalic and atraumatic.  Eyes: Pupils are equal, round, and reactive to light.  Neck: Normal range  of motion.  Cardiovascular: Normal rate and regular rhythm.   Pulmonary/Chest: Effort normal. No respiratory distress.  Musculoskeletal: Normal range of motion.  Neurological: She is alert and oriented to person, place, and time.  Skin: Skin is warm and dry.  There is a 3 cm ecchymotic fluctuant area on the right lower abdominal wall with slight induration and slight tenderness--there are puncture marks where this is been attempted opening  Procedure-Lantus with 11 blade Serosanguineous fluid with some pus drained and cultured  Psychiatric: She has a normal mood and affect. Her behavior is normal.  Nursing note and vitals reviewed.     Assessment & Plan:   Abscess, abdomen - Plan: Wound culture  Meds ordered this encounter  Medications  . doxycycline (VIBRA-TABS) 100 MG tablet    Sig: Take 1 tablet (100 mg total) by mouth 2 (two) times daily.    Dispense:  20 tablet    Refill:  0      I have completed the patient encounter in its entirety as documented by the scribe, with editing by me where necessary. Robert P. Laney Pastor, M.D.

## 2015-04-06 LAB — WOUND CULTURE
Gram Stain: NONE SEEN
Gram Stain: NONE SEEN
ORGANISM ID, BACTERIA: NO GROWTH

## 2015-04-12 ENCOUNTER — Other Ambulatory Visit: Payer: Self-pay | Admitting: Internal Medicine

## 2015-04-15 NOTE — Telephone Encounter (Signed)
Faxed to [facility] order pharm.

## 2015-04-16 ENCOUNTER — Ambulatory Visit (INDEPENDENT_AMBULATORY_CARE_PROVIDER_SITE_OTHER): Payer: Medicare Other | Admitting: Internal Medicine

## 2015-04-16 VITALS — BP 122/72 | HR 80 | Temp 97.7°F | Resp 17 | Ht <= 58 in | Wt 155.0 lb

## 2015-04-16 DIAGNOSIS — S31109D Unspecified open wound of abdominal wall, unspecified quadrant without penetration into peritoneal cavity, subsequent encounter: Secondary | ICD-10-CM

## 2015-04-16 MED ORDER — MUPIROCIN CALCIUM 2 % EX CREA: 1.0000 "application " | TOPICAL_CREAM | Freq: Two times a day (BID) | CUTANEOUS | Status: DC

## 2015-04-16 NOTE — Progress Notes (Signed)
Subjective:  This chart was scribed for Natalie Lin, MD by Moises Blood, Medical Scribe. This patient was seen in Room 11 and the patient's care was started at 1:29 PM.     Patient ID: Natalie [name], female    DOB: 29-Mar-1951, 64 y.o.   MRN: [id number]  HPI Natalie [name] is a 64 y.o. female who presents to [facility] complaining of RLQ abdominal pain from an abscess that started about 2 weeks ago.  C visit 04/03/2015  She was unable to clearly complete doxycycline due to GI distress Pt notes the area  hasn't healed much. There is some pain in the area as well.  There is not very much drainage.  No fever. Wound cult neg from 7/3.   she also has questions about her meds for hypertension She was started on cozaar in January, and has questions about continuing it. She is also on lopressor 100 mg and was reduced from 2 tablets to 1 tablet within past year.    Patient Active Problem List   Diagnosis Date Noted  . Dehydration   . Abdominal pain   . AKI (acute kidney injury)   . Weakness   . UTI (lower urinary tract infection) 08/31/2014  . Pyelonephritis 08/31/2014  . 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 BMI 33.0-33.9,adult 03/30/2013  . Dermatofibroma 12/03/2012  . Recurrent UTI 09/17/2012  . DM (diabetes mellitus) 03/19/2012  . HTN (hypertension) 03/19/2012  . Hyperlipemia 03/19/2012  . Hypothyroidism 03/19/2012  . Seizure disorder 03/19/2012  . Insomnia 03/19/2012  . Migraine 03/19/2012    Current outpatient prescriptions:  .  atorvastatin (LIPITOR) 20 MG tablet, Take 1 tablet (20 mg total) by mouth daily., Disp: 90 tablet, Rfl: 3 .  beclomethasone (QVAR) 80 MCG/ACT inhaler, Inhale 1 puff into the lungs 2 (two) times daily., Disp: 1 Inhaler, Rfl: 0 .  butorphanol (STADOL) 10 MG/ML nasal spray, Place 1 spray into the nose every 4 (four) hours as needed for headache. May refill weekly, Disp: 2.5 mL, Rfl: 0 .  butorphanol (STADOL) 10  MG/ML nasal spray, Place 1 spray into the nose every 4 (four) hours as needed for headache. May refill weekly, Disp: 2.5 mL, Rfl: 5 .  levothyroxine (SYNTHROID, LEVOTHROID) 75 MCG tablet, Take 1 tablet (75 mcg total) by mouth daily., Disp: 90 tablet, Rfl: 3 .  metFORMIN (GLUCOPHAGE) 1000 MG tablet, Take 1 tablet (1,000 mg total) by mouth daily with breakfast., Disp: 90 tablet, Rfl: 3 .  metoprolol succinate (TOPROL-XL) 100 MG 24 hr tablet, Take 100 mg by mouth daily. Take with or immediately following a meal., Disp: , Rfl:  .  PHENobarbital (LUMINAL) 97.2 MG tablet, TAKE TWO TABLETS BY MOUTH ONCE DAILY ON  MON,  WED,  AND  FRI.  AND  1  TABLET  DAILY  ON  OTHER  DAYS, Disp: 135 tablet, Rfl: 3 .  zolpidem (AMBIEN) 10 MG tablet, take 1 tablet by mouth at bedtime for sleep if needed, Disp: 30 tablet, Rfl: 5 .  doxycycline (VIBRA-TABS) 100 MG tablet, Take 1 tablet (100 mg total) by mouth 2 (two) times daily. (Patient not taking: Reported on 04/16/2015), Disp: 20 tablet, Rfl: 0    Review of Systems  Constitutional: Negative for fever, chills, diaphoresis and fatigue.  HENT: Negative for congestion, rhinorrhea, sneezing and sore throat.   Respiratory: Negative for cough and shortness of breath.   Gastrointestinal: Negative for nausea and constipation.  Skin: Positive for wound (RLQ abdomen).  Neurological: Negative for dizziness.       Objective:   Physical Exam  Constitutional: She is oriented to person, place, and time. She appears well-developed and well-nourished. No distress.  HENT:  Head: Normocephalic and atraumatic.  Eyes: EOM are normal. Pupils are equal, round, and reactive to light.  Neck: Neck supple.  Cardiovascular: Normal rate.   Pulmonary/Chest: Effort normal. No respiratory distress.  Abdominal: Soft. She exhibits no distension. There is no rebound.  The prior area of cellulitis and abscess over the right lower quadrant has resolved the nodularity, fluctuance and induration  and what is left is a 2 cm x 1 cm ulcerated area that is an open wound without discharge or bleeding, and it lies between 2 skin folds of an obese abdomen  Musculoskeletal: Normal range of motion.  Neurological: She is alert and oriented to person, place, and time.  Skin: Skin is warm and dry.  Psychiatric: She has a normal mood and affect. Her behavior is normal.  Nursing note and vitals reviewed. BP 122/72 mmHg  Pulse 80  Temp(Src) 97.7 F (36.5 C) (Oral)  Resp 17  Ht 4\' 9"  (1.448 m)  Wt 155 lb (70.308 kg)  BMI 33.53 kg/m2  SpO2 97% Initial blood pressure upon presentation was 160/119        Assessment & Plan:  P#1 wound from cellulitis not healing due to maceration between skin folds  Keep dry--open to air as much as possible Protect with telfa when up bactroban bid F/u 2 weeks  Problem #2 hypertension and diabetic She will decrease metoprolol to half 100 daily--- continue Cozaar (she has failed lisinopril due to side effects) Follow-up in 2 weeks as scheduled  I have completed the patient encounter in its entirety as documented by the scribe, with editing by me where necessary. Kree Armato P. Laney Pastor, M.D.

## 2015-04-27 ENCOUNTER — Ambulatory Visit (INDEPENDENT_AMBULATORY_CARE_PROVIDER_SITE_OTHER): Payer: Medicare Other | Admitting: Internal Medicine

## 2015-04-27 ENCOUNTER — Encounter: Payer: Self-pay | Admitting: Internal Medicine

## 2015-04-27 VITALS — BP 126/84 | HR 74 | Temp 98.7°F | Resp 16 | Ht <= 58 in | Wt 155.0 lb

## 2015-04-27 DIAGNOSIS — M79671 Pain in right foot: Secondary | ICD-10-CM

## 2015-04-27 DIAGNOSIS — I10 Essential (primary) hypertension: Secondary | ICD-10-CM | POA: Diagnosis not present

## 2015-04-27 DIAGNOSIS — M79672 Pain in left foot: Secondary | ICD-10-CM

## 2015-04-27 DIAGNOSIS — S31109D Unspecified open wound of abdominal wall, unspecified quadrant without penetration into peritoneal cavity, subsequent encounter: Secondary | ICD-10-CM

## 2015-04-27 DIAGNOSIS — E1129 Type 2 diabetes mellitus with other diabetic kidney complication: Secondary | ICD-10-CM | POA: Diagnosis not present

## 2015-04-27 DIAGNOSIS — E119 Type 2 diabetes mellitus without complications: Secondary | ICD-10-CM | POA: Diagnosis not present

## 2015-04-27 MED ORDER — METFORMIN HCL 1000 MG PO TABS: 1000.0000 mg | ORAL_TABLET | Freq: Two times a day (BID) | ORAL | Status: DC

## 2015-04-27 NOTE — Progress Notes (Signed)
Subjective:    Patient ID: Natalie [name], female    DOB: 11/23/1950, 64 y.o.   MRN: [id number]  HPI  Chief Complaint  Patient presents with  . Follow-up  . Cellulitis  . Hypertension  . Foot Pain    Patient presents today for follow up of abdominal wound. She reports that it is slowly healing.   Her feet have been hurting on the bottoms. Left > right. Felt like "nerve" pain. Pain is worse with walking. No numbness or tingling. Tender. She had a pedicure that exacerbated the pain. Does not bother her when sleeping. Pain when she gets up in the morning.   Blood sugars at home 149-179 fasting. After eating, 200.   Patient Active Problem List   Diagnosis Date Noted  . 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 BMI 33.0-33.9,adult 03/30/2013  . Recurrent UTI 09/17/2012  . HTN (hypertension) 03/19/2012  . Hyperlipemia 03/19/2012  . Hypothyroidism 03/19/2012  . Seizure disorder--stable for 20 years or more ///she is reluctant to stop her phenobarbital and says when this is been tried by neurology over these last several years that she always feels like her symptoms are coming back so she restarts the medicine  03/19/2012  . Insomnia 03/19/2012  . Migraine--severe syndrome with frequent headaches finally controlled by Dr. Collene Mares at [facility] with Stadol on a weekly basis. I have continue this regimen for the last decade upon his request and it has been successful  03/19/2012     Current outpatient prescriptions:  .  atorvastatin (LIPITOR) 20 MG tablet, Take 1 tablet (20 mg total) by mouth daily., Disp: 90 tablet, Rfl: 3 .  butorphanol (STADOL) 10 MG/ML nasal spray, Place 1 spray into the nose every 4 (four) hours as needed for headache. May refill weekly, Disp: 2.5 mL, Rfl: 5 .  levothyroxine (SYNTHROID, LEVOTHROID) 75 MCG tablet, Take 1 tablet (75 mcg total) by mouth daily., Disp: 90 tablet, Rfl: 3 .  losartan (COZAAR) 50 MG tablet, Take 50 mg by  mouth daily., Disp: , Rfl:  .  metFORMIN (GLUCOPHAGE) 1000 MG tablet, Take 1 tablet (1,000 mg total) by mouth daily with breakfast., Disp: 90 tablet, Rfl: 3 .  metoprolol succinate (TOPROL-XL) 100 MG 24 hr tablet, Take 100 mg by mouth daily. Take with or immediately following a meal., Disp: , Rfl:  .  mupirocin cream (BACTROBAN) 2 %, Apply 1 application topically 2 (two) times daily., Disp: 15 g, Rfl: 0 .  PHENobarbital (LUMINAL) 97.2 MG tablet, TAKE TWO TABLETS BY MOUTH ONCE DAILY ON  MON,  WED,  AND  FRI.  AND  1  TABLET  DAILY  ON  OTHER  DAYS, Disp: 135 tablet, Rfl: 3 .  zolpidem (AMBIEN) 10 MG tablet, take 1 tablet by mouth at bedtime for sleep if needed, Disp: 30 tablet, Rfl: 5  Review of Systems No fever or chills, no chest pain, no SOB    Objective:   Physical Exam  Constitutional: She is oriented to person, place, and time. She appears well-developed and well-nourished.  HENT:  Head: Normocephalic.  Eyes: Conjunctivae are normal.  Cardiovascular: Normal rate and regular rhythm.   Pulmonary/Chest: Effort normal.  Musculoskeletal: Normal range of motion.  Tender on soles of feet. No pain with flexion.   Neurological: She is alert and oriented to person, place, and time.  Skin: Skin is warm and dry.  RLQ abdominal wound- approximately 4-5 mm long, edges approximated, no drainage.  Psychiatric: She has a normal mood and affect. Her behavior is normal. Judgment and thought content normal.  Vitals reviewed.  Diabetic Foot Exam - Simple   Simple Foot Form  Diabetic Foot exam was performed with the following findings:  Yes 04/27/2015  1:52 PM  Visual Inspection  Sensation Testing  Pulse Check  Comments  Pt states she has had extreme nerve pain in both feet the last 10 days     Wt Readings from Last 3 Encounters:  04/27/15 155 lb (70.308 kg)  04/16/15 155 lb (70.308 kg)  04/03/15 160 lb (72.576 kg)  BP 150/85 mmHg  Pulse 74  Temp(Src) 98.7 F (37.1 C)  Resp 16  Ht 4\' 8"   (1.422 m)  Wt 155 lb (70.308 kg)  BMI 34.77 kg/m2    Assessment & Plan:  Patient seen with Dr. Laney Pastor 1. Open wound of abdomen, subsequent encounter - wound healing nicely. She is released from wound checks - encouraged her to continue to keep area clean and dry and leave open to air when possible. - RTC if increased swelling, erythema, pain, drainage  2. Type 2 diabetes mellitus with other diabetic kidney complication - increase metformin from 1000 mg po qd to BID. Can take 500 mg for 1-2 weeks.  - metFORMIN (GLUCOPHAGE) 1000 MG tablet; Take 1 tablet (1,000 mg total) by mouth 2 (two) times daily with a meal.  Dispense: 180 tablet; Refill: 3 - reviewed potential complications of diabetes and need for improved glucose control - she has lost 5 pounds and I encouraged her to make healthy food choices and continue to check home blood sugar  3. Essential hypertension - recheck of blood pressure greatly improved. Will discontinue metoprolol and stay on Cozaar.   4. Bilateral foot pain - does not sound like neuropathic pain. More typical of plantar fasciitis. Provided written and verbal instructions regarding stretching, icing, footwear.   - follow up in 4 months for HgbA1C  Clarene Reamer, FNP-BC  [facility], [place]  04/27/2015 4:11 PM I have participated in the care of this patient with the Advanced Practice Provider and agree with Diagnosis and Plan as documented. Robert P. Laney Pastor, M.D.

## 2015-04-27 NOTE — Patient Instructions (Addendum)
Roll bottoms of feet on frozen water bottles twice a day for 15- 20 minutes Do achiles tendon stretches.  Take metformin 500 mg every morning for 1-2 weeks, and then take 1000 mg every morning and evening.  Stop metoprolol Plantar Fasciitis Plantar fasciitis is a common condition that causes foot pain. It is soreness (inflammation) of the band of tough fibrous tissue on the bottom of the foot that runs from the heel bone (calcaneus) to the ball of the foot. The cause of this soreness may be from excessive standing, poor fitting shoes, running on hard surfaces, being overweight, having an abnormal walk, or overuse (this is common in runners) of the painful foot or feet. It is also common in aerobic exercise dancers and ballet dancers. SYMPTOMS  Most people with plantar fasciitis complain of:  Severe pain in the morning on the bottom of their foot especially when taking the first steps out of bed. This pain recedes after a few minutes of walking.  Severe pain is experienced also during walking following a long period of inactivity.  Pain is worse when walking barefoot or up stairs DIAGNOSIS   Your caregiver will diagnose this condition by examining and feeling your foot.  Special tests such as X-rays of your foot, are usually not needed. PREVENTION   Consult a sports medicine professional before beginning a new exercise program.  Walking programs offer a good workout. With walking there is a lower chance of overuse injuries common to runners. There is less impact and less jarring of the joints.  Begin all new exercise programs slowly. If problems or pain develop, decrease the amount of time or distance until you are at a comfortable level.  Wear good shoes and replace them regularly.  Stretch your foot and the heel cords at the back of the ankle (Achilles tendon) both before and after exercise.  Run or exercise on even surfaces that are not hard. For example, asphalt is better than  pavement.  Do not run barefoot on hard surfaces.  If using a treadmill, vary the incline.  Do not continue to workout if you have foot or joint problems. Seek professional help if they do not improve. HOME CARE INSTRUCTIONS   Avoid activities that cause you pain until you recover.  Use ice or cold packs on the problem or painful areas after working out.  Only take over-the-counter or prescription medicines for pain, discomfort, or fever as directed by your caregiver.  Soft shoe inserts or athletic shoes with air or gel sole cushions may be helpful.  If problems continue or become more severe, consult a sports medicine caregiver or your own health care provider. Cortisone is a potent anti-inflammatory medication that may be injected into the painful area. You can discuss this treatment with your caregiver. MAKE SURE YOU:   Understand these instructions.  Will watch your condition.  Will get help right away if you are not doing well or get worse. Document Released: 06/12/2001 Document Revised: 12/10/2011 Document Reviewed: 08/11/2008 [facility] Patient Information 2015 [place], [place]. This information is not intended to replace advice given to you by your health care provider. Make sure you discuss any questions you have with your health care provider.

## 2015-05-15 ENCOUNTER — Telehealth: Payer: Self-pay

## 2015-05-15 NOTE — Telephone Encounter (Signed)
Pt is requesting a refill of STADOL.

## 2015-05-16 MED ORDER — BUTORPHANOL TARTRATE 10 MG/ML NA SOLN: 1.0000 | NASAL | Status: DC | PRN

## 2015-05-18 NOTE — Telephone Encounter (Signed)
Notified pt ready. 

## 2015-05-25 ENCOUNTER — Other Ambulatory Visit: Payer: Self-pay | Admitting: Internal Medicine

## 2015-05-26 NOTE — Telephone Encounter (Signed)
Dr Laney Pastor, you just saw pt recently but don't see this med discussed. OK to RF or do you want pt to RTC for eval?

## 2015-06-06 DIAGNOSIS — Z23 Encounter for immunization: Secondary | ICD-10-CM | POA: Diagnosis not present

## 2015-06-14 ENCOUNTER — Telehealth: Payer: Self-pay

## 2015-06-14 ENCOUNTER — Ambulatory Visit (INDEPENDENT_AMBULATORY_CARE_PROVIDER_SITE_OTHER): Payer: Medicare Other | Admitting: Internal Medicine

## 2015-06-14 VITALS — BP 122/78 | HR 107 | Temp 99.0°F | Resp 18 | Ht <= 58 in | Wt 153.0 lb

## 2015-06-14 DIAGNOSIS — G4700 Insomnia, unspecified: Secondary | ICD-10-CM | POA: Diagnosis not present

## 2015-06-14 DIAGNOSIS — G43719 Chronic migraine without aura, intractable, without status migrainosus: Secondary | ICD-10-CM

## 2015-06-14 MED ORDER — BUTORPHANOL TARTRATE 10 MG/ML NA SOLN: 1.0000 | NASAL | Status: DC | PRN

## 2015-06-14 MED ORDER — ZOLPIDEM TARTRATE 10 MG PO TABS: ORAL_TABLET | ORAL | Status: DC

## 2015-06-14 NOTE — Progress Notes (Signed)
Subjective:  This chart was scribed for [name], MD by Moises Blood, Medical Scribe. This patient was seen in Room 12 and the patient's care was started at 2:29 PM.     Patient ID: Natalie [name], female    DOB: 02/01/1951, 64 y.o.   MRN: [id number] Chief Complaint  Patient presents with  . Medication Refill    stadol, ambien     HPI Natalie [name] is a 64 y.o. female who presents to [facility] for medication refill.  She said that her [name] needs authorization. This has worked well though other medicines working well for her insomnia.  She continues to manage her migraines at home with intermittent use of Stadol and has avoided all emergency room visits or need for injectable narcotics for these migraines for several years now.  She was last seen by me with a wound on her right flank. Her wound is better now but every now and then she would have a bruise in the area.  Her foot pain is also better now.   Her daughter is now seeing Valda Lamb for PT.    Patient Active Problem List   Diagnosis Date Noted  . Dehydration   . Abdominal pain   . AKI (acute kidney injury)   . Weakness   . UTI (lower urinary tract infection) 08/31/2014  . Pyelonephritis 08/31/2014  . 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 BMI 33.0-33.9,adult 03/30/2013  . Dermatofibroma 12/03/2012  . Recurrent UTI 09/17/2012  . DM (diabetes mellitus) 03/19/2012  . HTN (hypertension) 03/19/2012  . Hyperlipemia 03/19/2012  . Hypothyroidism 03/19/2012  . Seizure disorder 03/19/2012  . Insomnia 03/19/2012  . Migraine 03/19/2012    Current outpatient prescriptions:  .  atorvastatin (LIPITOR) 20 MG tablet, Take 1 tablet (20 mg total) by mouth daily., Disp: 90 tablet, Rfl: 3 .  butorphanol (STADOL) 10 MG/ML nasal spray, Place 1 spray into the nose every 4 (four) hours as needed for headache. May refill weekly, Disp: 2.5 mL, Rfl: 5 .  levothyroxine (SYNTHROID,  LEVOTHROID) 75 MCG tablet, Take 1 tablet (75 mcg total) by mouth daily., Disp: 90 tablet, Rfl: 3 .  losartan (COZAAR) 50 MG tablet, Take 50 mg by mouth daily., Disp: , Rfl:  .  metFORMIN (GLUCOPHAGE) 1000 MG tablet, Take 1 tablet (1,000 mg total) by mouth 2 (two) times daily with a meal., Disp: 180 tablet, Rfl: 3 .  ondansetron (ZOFRAN-ODT) 8 MG disintegrating tablet, DISSOLVE 1 TABLET UNDER THE TONGUE EVERY 8 HOURS AS NEEDED FOR NAUSEA., Disp: 20 tablet, Rfl: 3 .  PHENobarbital (LUMINAL) 97.2 MG tablet, TAKE TWO TABLETS BY MOUTH ONCE DAILY ON  MON,  WED,  AND  FRI.  AND  1  TABLET  DAILY  ON  OTHER  DAYS, Disp: 135 tablet, Rfl: 3 .  zolpidem (AMBIEN) 10 MG tablet, take 1 tablet by mouth at bedtime for sleep if needed, Disp: 30 tablet, Rfl: 5 .  metoprolol succinate (TOPROL-XL) 100 MG 24 hr tablet, Take 100 mg by mouth daily. Take with or immediately following a meal., Disp: , Rfl:  .  mupirocin cream (BACTROBAN) 2 %, Apply 1 application topically 2 (two) times daily. (Patient not taking: Reported on 06/14/2015), Disp: 15 g, Rfl: 0    Review of Systems  Skin: Negative for rash and wound.       Objective:   Physical Exam  Constitutional: She is oriented to person, place, and time. She appears well-developed and well-nourished. No  distress.  HENT:  Head: Normocephalic and atraumatic.  Eyes: EOM are normal. Pupils are equal, round, and reactive to light.  Neck: Neck supple.  Cardiovascular: Normal rate.   Pulmonary/Chest: Effort normal. No respiratory distress.  Musculoskeletal: Normal range of motion.  Neurological: She is alert and oriented to person, place, and time.  Skin: Skin is warm and dry.  Psychiatric: She has a normal mood and affect. Her behavior is normal.  Nursing note and vitals reviewed.   BP 122/78 mmHg  Pulse 107  Temp(Src) 99 F (37.2 C) (Oral)  Resp 18  Ht 4\' 8"  (1.422 m)  Wt 153 lb (69.4 kg)  BMI 34.32 kg/m2  SpO2 96%      Assessment & Plan:   I have  completed the patient encounter in its entirety as documented by the scribe, with editing by me where necessary. Okey Zelek P. Laney Pastor, M.D. Insomnia  Intractable chronic migraine without aura and without status migrainosus   Meds ordered this encounter  Medications  . zolpidem (AMBIEN) 10 MG tablet    Sig: take 1 tablet by mouth at bedtime for sleep if needed    Dispense:  30 tablet    Refill:  5  . butorphanol (STADOL) 10 MG/ML nasal spray    Sig: Place 1 spray into the nose every 4 (four) hours as needed for headache. May refill weekly    Dispense:  2.5 mL    Refill:  5   follow-up for labs and evaluation of other problems as scheduled in November Note last A1c 8.6-she has improved diet

## 2015-06-14 NOTE — Telephone Encounter (Signed)
PA completed on covermymeds. Pending. Following copied from prev report from pt: Pt reported that she tried several things in the past for insomnia, but has been using Ambien for several years w/good effectiveness. Pt was initially started on Ambien CR 12.5 on 12/10/2005, changed to [place] 10mg  for a short trial 03/04/2006, and then put on Ambien 10 mg 05/05/2006, which worked the best for pt. Pt has been taking zolpidem for many years w/out adverse SEs.

## 2015-06-15 NOTE — Telephone Encounter (Signed)
[employer] sent fax asking if pt has ever tried/failed their formulary alternatives: rozerem, and silenor. Pt did not mention any other meds other than the sonata. Just as I was getting ready to fax this back, the [employer] rep called me and discussed, took down info that she has not tried the two alternatives, but also took down long h/o effective use w/no SEs, and I answered questions pertaining to high risk meds. He will send it to pharmacist and fax decision.

## 2015-06-16 ENCOUNTER — Encounter (HOSPITAL_COMMUNITY): Payer: Self-pay | Admitting: *Deleted

## 2015-07-04 NOTE — Telephone Encounter (Signed)
PA was denied. No notice received but noticed denial on covermymeds w/no further info. I sent notice back to pharm w/note to let me know if an alternative is needed, if pt did not pay cash for Rx.

## 2015-07-05 ENCOUNTER — Encounter: Payer: Self-pay | Admitting: Emergency Medicine

## 2015-07-06 MED ORDER — DOXEPIN HCL 6 MG PO TABS: 6.0000 mg | ORAL_TABLET | Freq: Every day | ORAL | Status: DC

## 2015-07-06 NOTE — Telephone Encounter (Signed)
Dr Laney Pastor, pt came into 102 and stated that she would like to try one of the plan alternatives which are: rozerem, and silenor, since cost for zolpidem to pay cash is more than she would like to spend if one of the others would be effective. Please advise.

## 2015-07-06 NOTE — Telephone Encounter (Signed)
Meds ordered this encounter  Medications  . Doxepin HCl 6 MG TABS    Sig: Take 1 tablet (6 mg total) by mouth at bedtime.    Dispense:  30 tablet    Refill:  1   Let me know if works

## 2015-07-06 NOTE — Addendum Note (Signed)
Addended by: Leandrew Koyanagi on: 07/06/2015 01:50 PM   Modules accepted: Orders

## 2015-07-07 NOTE — Telephone Encounter (Signed)
LMOM for pt on cell # to advise of new Rx.

## 2015-07-13 ENCOUNTER — Telehealth: Payer: Self-pay

## 2015-07-13 NOTE — Telephone Encounter (Signed)
Pt would like a CB from [place] concerning a script that was written for her. This script is $92 to be filled. This is to much for her. She's  not sure of the name of the script. Please advise at [phone]

## 2015-07-14 NOTE — Telephone Encounter (Signed)
Pt reported that the doxepin (silenor) 6 mg Rx is very expensive and ins didn't seem to be covering it. I have not gotten notice from pharm, but took pt's ins info: [employer] ID # [id number]. Completed PA on covermymeds and faxed in 03/09/15 OV notes. Pending.

## 2015-07-18 NOTE — Telephone Encounter (Signed)
PA approved through 10/01/15. Pt notified.

## 2015-07-25 ENCOUNTER — Other Ambulatory Visit: Payer: Self-pay | Admitting: Internal Medicine

## 2015-07-27 NOTE — Telephone Encounter (Signed)
Faxed

## 2015-07-30 ENCOUNTER — Other Ambulatory Visit: Payer: Self-pay | Admitting: Internal Medicine

## 2015-07-30 MED ORDER — BUTORPHANOL TARTRATE 10 MG/ML NA SOLN
1.0000 | NASAL | Status: DC | PRN
Start: 2015-07-30 — End: 2015-08-31

## 2015-08-08 ENCOUNTER — Ambulatory Visit (INDEPENDENT_AMBULATORY_CARE_PROVIDER_SITE_OTHER): Payer: Medicare Other | Admitting: Internal Medicine

## 2015-08-08 VITALS — BP 138/70 | HR 89 | Temp 98.6°F | Resp 20 | Ht <= 58 in | Wt 152.0 lb

## 2015-08-08 DIAGNOSIS — G4700 Insomnia, unspecified: Secondary | ICD-10-CM

## 2015-08-08 DIAGNOSIS — G43719 Chronic migraine without aura, intractable, without status migrainosus: Secondary | ICD-10-CM

## 2015-08-08 MED ORDER — ZOLPIDEM TARTRATE 10 MG PO TABS: 10.0000 mg | ORAL_TABLET | Freq: Every evening | ORAL | Status: DC | PRN

## 2015-08-08 NOTE — Progress Notes (Signed)
Subjective:  This chart was scribed for [name], MD by Moises Blood, Medical Scribe. This patient was seen in Room 4 and the patient's care was started at 3:56 PM.    Patient ID: Natalie [name], female    DOB: 12-03-1950, 64 y.o.   MRN: [id number] Chief Complaint  Patient presents with  . Discuss Medication    Ambien    HPI Natalie [name] is a 64 y.o. female who presents to [facility] for medication refill. Insurance forced change to doxepin 6 from [place]. The doxepin Rx is very expensive ($92 to be filled)-this is copay. She wants to stay on ambien.  amb has worked for yrs without problems.  Still stress at home. Her granddaughter is still struggling with depr and will see psychiatry in December.   Patient Active Problem List   Diagnosis Date Noted  . Dehydration   . Abdominal pain   . AKI (acute kidney injury) ([place])   . Weakness   . UTI (lower urinary tract infection) 08/31/2014  . Pyelonephritis 08/31/2014  . 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 BMI 33.0-33.9,adult 03/30/2013  . Dermatofibroma 12/03/2012  . Recurrent UTI 09/17/2012  . DM (diabetes mellitus) ([place]) 03/19/2012  . HTN (hypertension) 03/19/2012  . Hyperlipemia 03/19/2012  . Hypothyroidism 03/19/2012  . Seizure disorder ([place]) 03/19/2012  . Insomnia 03/19/2012  . Migraine 03/19/2012  she will be here in Dec for f/u labs and meds  Current outpatient prescriptions:  .  atorvastatin (LIPITOR) 20 MG tablet, Take 1 tablet (20 mg total) by mouth daily., Disp: 90 tablet, Rfl: 3 .  butorphanol (STADOL) 10 MG/ML nasal spray, Place 1 spray into the nose every 4 (four) hours as needed for headache. May refill weekly, Disp: 2.5 mL, Rfl: 5 .  levothyroxine (SYNTHROID, LEVOTHROID) 75 MCG tablet, Take 1 tablet (75 mcg total) by mouth daily., Disp: 90 tablet, Rfl: 3 .  losartan (COZAAR) 50 MG tablet, Take 50 mg by mouth daily., Disp: , Rfl:  .  metFORMIN (GLUCOPHAGE) 1000  MG tablet, Take 1 tablet (1,000 mg total) by mouth 2 (two) times daily with a meal., Disp: 180 tablet, Rfl: 3 .  ondansetron (ZOFRAN-ODT) 8 MG disintegrating tablet, DISSOLVE 1 TABLET UNDER THE TONGUE EVERY 8 HOURS AS NEEDED FOR NAUSEA., Disp: 20 tablet, Rfl: 3 .  PHENobarbital (LUMINAL) 97.2 MG tablet, TAKE TWO TABLETS BY MOUTH ONCE DAILY ON  MON,  WED,  AND  FRI  AND  1  TABLET  DAILY  ON  OTHER  DAYS, Disp: 135 tablet, Rfl: 0 .  zolpidem (AMBIEN) 10 MG tablet, take 1 tablet by mouth at bedtime for sleep if needed, Disp: 30 tablet, Rfl: 5 .  Doxepin HCl 6 MG TABS, Take 1 tablet (6 mg total) by mouth at bedtime. (Patient not taking: Reported on 08/08/2015), Disp: 30 tablet, Rfl: 1 .  metoprolol succinate (TOPROL-XL) 100 MG 24 hr tablet, Take 100 mg by mouth daily. Take with or immediately following a meal., Disp: , Rfl:  .  mupirocin cream (BACTROBAN) 2 %, Apply 1 application topically 2 (two) times daily. (Patient not taking: Reported on 06/14/2015), Disp: 15 g, Rfl: 0  Review of Systems  Constitutional: Negative for appetite change and fatigue.  Skin: Negative for rash and wound.  Neurological:       Migr stable  otherwise [place]     Objective:   Physical Exam  Constitutional: She is oriented to person, place, and time. She appears well-developed  and well-nourished. No distress.  HENT:  Head: Normocephalic and atraumatic.  Eyes: EOM are normal. Pupils are equal, round, and reactive to light.  Neck: Neck supple.  Cardiovascular: Normal rate.   Pulmonary/Chest: Effort normal.  Neurological: She is alert and oriented to person, place, and time.  Psychiatric: She has a normal mood and affect. Her behavior is normal.  Nursing note and vitals reviewed.   BP 138/70 mmHg  Pulse 89  Temp(Src) 98.6 F (37 C) (Oral)  Resp 20  Ht 4\' 8"  (1.422 m)  Wt 152 lb (68.947 kg)  BMI 34.10 kg/m2  SpO2 97%     Assessment & Plan:  Insomnia  Intractable chronic migraine without aura and without status  migrainosus  Continue to ensure good sleep as part of Migr prevention Fu 1 mo   By signing my name below, I, Moises Blood, attest that this documentation has been prepared under the direction and in the presence of [name], MD. Electronically Signed: Moises Blood, Scribe. 08/08/2015 , 3:56 PM .  I have completed the patient encounter in its entirety as documented by the scribe, with editing by me where necessary. Orvetta Danielski P. Laney Pastor, M.D.

## 2015-08-29 ENCOUNTER — Encounter: Payer: Self-pay | Admitting: Internal Medicine

## 2015-08-31 ENCOUNTER — Ambulatory Visit (INDEPENDENT_AMBULATORY_CARE_PROVIDER_SITE_OTHER): Payer: Medicare Other | Admitting: Internal Medicine

## 2015-08-31 ENCOUNTER — Other Ambulatory Visit: Payer: Self-pay | Admitting: Internal Medicine

## 2015-08-31 ENCOUNTER — Encounter: Payer: Self-pay | Admitting: Internal Medicine

## 2015-08-31 VITALS — BP 162/89 | HR 105 | Temp 98.2°F | Resp 16 | Ht <= 58 in | Wt 151.0 lb

## 2015-08-31 DIAGNOSIS — E1129 Type 2 diabetes mellitus with other diabetic kidney complication: Secondary | ICD-10-CM

## 2015-08-31 DIAGNOSIS — R809 Proteinuria, unspecified: Secondary | ICD-10-CM

## 2015-08-31 DIAGNOSIS — E038 Other specified hypothyroidism: Secondary | ICD-10-CM | POA: Diagnosis not present

## 2015-08-31 DIAGNOSIS — E039 Hypothyroidism, unspecified: Secondary | ICD-10-CM

## 2015-08-31 DIAGNOSIS — D649 Anemia, unspecified: Secondary | ICD-10-CM

## 2015-08-31 DIAGNOSIS — E785 Hyperlipidemia, unspecified: Secondary | ICD-10-CM | POA: Diagnosis not present

## 2015-08-31 DIAGNOSIS — D539 Nutritional anemia, unspecified: Secondary | ICD-10-CM | POA: Diagnosis not present

## 2015-08-31 DIAGNOSIS — D509 Iron deficiency anemia, unspecified: Secondary | ICD-10-CM

## 2015-08-31 DIAGNOSIS — Z1159 Encounter for screening for other viral diseases: Secondary | ICD-10-CM | POA: Diagnosis not present

## 2015-08-31 LAB — COMPREHENSIVE METABOLIC PANEL
ALT: 9 U/L (ref 6–29)
AST: 11 U/L (ref 10–35)
Albumin: 3.9 g/dL (ref 3.6–5.1)
Alkaline Phosphatase: 59 U/L (ref 33–130)
BUN: 26 mg/dL — AB (ref 7–25)
CALCIUM: 9.7 mg/dL (ref 8.6–10.4)
CHLORIDE: 104 mmol/L (ref 98–110)
CO2: 23 mmol/L (ref 20–31)
Creat: 0.93 mg/dL (ref 0.50–0.99)
GLUCOSE: 184 mg/dL — AB (ref 65–99)
POTASSIUM: 5.1 mmol/L (ref 3.5–5.3)
Sodium: 140 mmol/L (ref 135–146)
TOTAL PROTEIN: 6.9 g/dL (ref 6.1–8.1)
Total Bilirubin: 0.2 mg/dL (ref 0.2–1.2)

## 2015-08-31 LAB — CBC WITH DIFFERENTIAL/PLATELET
Basophils Absolute: 0.1 10*3/uL (ref 0.0–0.1)
Basophils Relative: 1 % (ref 0–1)
EOS PCT: 3 % (ref 0–5)
Eosinophils Absolute: 0.3 10*3/uL (ref 0.0–0.7)
HEMATOCRIT: 33.3 % — AB (ref 36.0–46.0)
Hemoglobin: 10.4 g/dL — ABNORMAL LOW (ref 12.0–15.0)
LYMPHS ABS: 1.8 10*3/uL (ref 0.7–4.0)
LYMPHS PCT: 21 % (ref 12–46)
MCH: 27.6 pg (ref 26.0–34.0)
MCHC: 31.2 g/dL (ref 30.0–36.0)
MCV: 88.3 fL (ref 78.0–100.0)
MONO ABS: 0.8 10*3/uL (ref 0.1–1.0)
MPV: 10.2 fL (ref 8.6–12.4)
Monocytes Relative: 9 % (ref 3–12)
Neutro Abs: 5.6 10*3/uL (ref 1.7–7.7)
Neutrophils Relative %: 66 % (ref 43–77)
Platelets: 359 10*3/uL (ref 150–400)
RBC: 3.77 MIL/uL — ABNORMAL LOW (ref 3.87–5.11)
RDW: 14.7 % (ref 11.5–15.5)
WBC: 8.5 10*3/uL (ref 4.0–10.5)

## 2015-08-31 LAB — LIPID PANEL
CHOLESTEROL: 152 mg/dL (ref 125–200)
HDL: 56 mg/dL (ref >=46)
LDL CALC: 79 mg/dL (ref <130)
TRIGLYCERIDES: 87 mg/dL (ref <150)
Total CHOL/HDL Ratio: 2.7 Ratio (ref <=5.0)
VLDL: 17 mg/dL (ref <30)

## 2015-08-31 LAB — POCT GLYCOSYLATED HEMOGLOBIN (HGB A1C): HEMOGLOBIN A1C: 7.1

## 2015-08-31 LAB — HEMOGLOBIN A1C: HEMOGLOBIN A1C: 7.1 % — AB (ref 4.0–6.0)

## 2015-08-31 LAB — TSH: TSH: 2.542 u[IU]/mL (ref 0.350–4.500)

## 2015-08-31 MED ORDER — ATORVASTATIN CALCIUM 20 MG PO TABS: 20.0000 mg | ORAL_TABLET | Freq: Every day | ORAL | Status: DC

## 2015-08-31 MED ORDER — BUTORPHANOL TARTRATE 10 MG/ML NA SOLN: 1.0000 | NASAL | Status: DC | PRN

## 2015-08-31 MED ORDER — PHENOBARBITAL 97.2 MG PO TABS: ORAL_TABLET | ORAL | Status: DC

## 2015-08-31 MED ORDER — LOSARTAN POTASSIUM 50 MG PO TABS: 50.0000 mg | ORAL_TABLET | Freq: Every day | ORAL | Status: DC

## 2015-08-31 NOTE — Progress Notes (Signed)
Subjective:    Patient ID: Natalie [name], female    DOB: Aug 28, 1951, 64 y.o.   MRN: [id number]  HPIfu Patient Active Problem List   Diagnosis Date Noted  . 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 BMI 33.0-33.9,adult Wt Readings from Last 3 Encounters:  08/31/15 151 lb (68.493 kg)  08/08/15 152 lb (68.947 kg)  06/14/15 153 lb (69.4 kg)   Not making progress w/ wt loss-recalcitrant approach 03/30/2013  . Recurrent UTI---last pyelo 3/16 had neg cult but resp to rx 09/17/2012  . HTN (hypertension) 03/19/2012  . Hyperlipemia 03/19/2012  . Hypothyroidism 03/19/2012  . Seizure disorder (HCC)--when meds stopped during hospitalization 12/15 she started having usual preseizure  Activity of hand shaking which stopped when phenobarb(DR LOVE started this many yrs ago)was restarted 03/19/2012  . Insomnia---resp to medsbut can't do without 03/19/2012  . Migraine---see long hx in chart of unremitting migr syndrome with [facility] term care at [facility] Dr Collene Mares transferred to [place] eventually--weekly episodes requiring injectables now controlled for years by stadol 03/19/2012     P23 at pharm recent She remains reluctant to do colonoscopy  Review of Systems  Constitutional: Negative for fever, activity change, appetite change and fatigue.  HENT: Negative for trouble swallowing.   Eyes: Negative for visual disturbance.  Respiratory: Negative for shortness of breath.   Cardiovascular: Negative for chest pain, palpitations and leg swelling.  Gastrointestinal: Negative for abdominal pain.  Genitourinary: Negative for dysuria and frequency.  Neurological: Negative for weakness and numbness.  Psychiatric/Behavioral: Negative for confusion, dysphoric mood and decreased concentration.       Objective:   Physical Exam  Constitutional: She is oriented to person, place, and time. She appears well-developed and  well-nourished. No distress.  HENT:  Head: Normocephalic and atraumatic.  Eyes: Pupils are equal, round, and reactive to light.  Neck: Normal range of motion.  Cardiovascular: Normal rate and regular rhythm.   Pulmonary/Chest: Effort normal. No respiratory distress.  Musculoskeletal: Normal range of motion.  Neurological: She is alert and oriented to person, place, and time.  Sensory intact lower extremities  Skin: Skin is warm and dry.  Psychiatric: She has a normal mood and affect. Her behavior is normal.  Nursing note and vitals reviewed.  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 She will check several readings over the next month and let us know if over 130/80  Other specified hypothyroidism - Plan: TSH  Anemia, unspecified anemia type/thought secondary to chronic illness - Plan: CBC with Differential/Platelet  Need for hepatitis C screening test - Plan: Hepatitis C antibody  Meds ordered this encounter  Medications  . PNEUMOVAX 23 25 MCG/0.5ML injection    Sig: inject 0.5 milliliter intramuscularly    Refill:  0  . FLUZONE HIGH-DOSE 0.5 ML SUSY    Sig: inject 0.5 milliliter intramuscularly    Refill:  0  . atorvastatin (LIPITOR) 20 MG tablet  Sig: Take 1 tablet (20 mg total) by mouth daily.    Dispense:  90 tablet    Refill:  3  . losartan (COZAAR) 50 MG tablet    Sig: Take 1 tablet (50 mg total) by mouth daily.    Dispense:  90 tablet     Refill:  3  . PHENobarbital (LUMINAL) 97.2 MG tablet    Sig: TAKE TWO TABLETS BY MOUTH ONCE DAILY ON  MON,  WED,  AND  FRI  AND  1  TABLET  DAILY  ON  OTHER  DAYS    Dispense:  135 tablet    Refill:  3  . butorphanol (STADOL) 10 MG/ML nasal spray    Sig: Place 1 spray into the nose every 4 (four) hours as needed for headache. May refill weekly    Dispense:  2.5 mL    Refill:  5   Adden-labs Results for orders placed or performed in visit on 08/31/15  CBC with Differential/Platelet  Result Value Ref Range   WBC 8.5 4.0 - 10.5 K/uL   RBC 3.77 (L) 3.87 - 5.11 MIL/uL   Hemoglobin 10.4 (L) 12.0 - 15.0 g/dL   HCT 33.3 (L) 36.0 - 46.0 %   MCV 88.3 78.0 - 100.0 fL   MCH 27.6 26.0 - 34.0 pg   MCHC 31.2 30.0 - 36.0 g/dL   RDW 14.7 11.5 - 15.5 %   Platelets 359 150 - 400 K/uL   MPV 10.2 8.6 - 12.4 fL   Neutrophils Relative % 66 43 - 77 %   Neutro Abs 5.6 1.7 - 7.7 K/uL   Lymphocytes Relative 21 12 - 46 %   Lymphs Abs 1.8 0.7 - 4.0 K/uL   Monocytes Relative 9 3 - 12 %   Monocytes Absolute 0.8 0.1 - 1.0 K/uL   Eosinophils Relative 3 0 - 5 %   Eosinophils Absolute 0.3 0.0 - 0.7 K/uL   Basophils Relative 1 0 - 1 %   Basophils Absolute 0.1 0.0 - 0.1 K/uL   Smear Review Criteria for review not met   Lipid panel  Result Value Ref Range   Cholesterol 152 125 - 200 mg/dL   Triglycerides 87 <150 mg/dL   HDL 56 >=46 mg/dL   Total CHOL/HDL Ratio 2.7 <=5.0 Ratio   VLDL 17 <30 mg/dL   LDL Cholesterol 79 <130 mg/dL  Comprehensive metabolic panel  Result Value Ref Range   Sodium 140 135 - 146 mmol/L   Potassium 5.1 3.5 - 5.3 mmol/L   Chloride 104 98 - 110 mmol/L   CO2 23 20 - 31 mmol/L   Glucose, Bld 184 (H) 65 - 99 mg/dL   BUN 26 (H) 7 - 25 mg/dL   Creat 0.93 0.50 - 0.99 mg/dL   Total Bilirubin 0.2 0.2 - 1.2 mg/dL   Alkaline Phosphatase 59 33 - 130 U/L   AST 11 10 - 35 U/L   ALT 9 6 - 29 U/L   Total Protein 6.9 6.1 - 8.1 g/dL   Albumin 3.9 3.6 - 5.1 g/dL   Calcium 9.7 8.6 - 10.4 mg/dL   TSH  Result Value Ref Range   TSH 2.542 0.350 - 4.500 uIU/mL  Hepatitis C antibody  Result Value Ref Range   HCV Ab NEGATIVE NEGATIVE  POCT glycosylated hemoglobin (Hb A1C)  Result Value Ref Range   Hemoglobin A1C 7.1    Add b12, fe, tibc--Hgb low off on since 2008.past wu unrevealing Declines colo #2

## 2015-09-01 ENCOUNTER — Encounter: Payer: Self-pay | Admitting: Family Medicine

## 2015-09-01 LAB — HEPATITIS C ANTIBODY: HCV AB: NEGATIVE

## 2015-09-02 ENCOUNTER — Encounter: Payer: Self-pay | Admitting: Internal Medicine

## 2015-09-02 LAB — IRON AND TIBC
%SAT: 8 % — AB (ref 11–50)
IRON: 33 ug/dL — AB (ref 45–160)
TIBC: 398 ug/dL (ref 250–450)
UIBC: 365 ug/dL (ref 125–400)

## 2015-09-02 LAB — VITAMIN B12: VITAMIN B 12: 285 pg/mL (ref 211–911)

## 2015-09-02 MED ORDER — LEVOTHYROXINE SODIUM 75 MCG PO TABS: 75.0000 ug | ORAL_TABLET | Freq: Every day | ORAL | Status: DC

## 2015-09-02 NOTE — Addendum Note (Signed)
Addended by: Leandrew Koyanagi on: 09/02/2015 06:50 PM   Modules accepted: Orders

## 2015-09-06 ENCOUNTER — Other Ambulatory Visit: Payer: Self-pay

## 2015-09-06 DIAGNOSIS — E039 Hypothyroidism, unspecified: Secondary | ICD-10-CM

## 2015-09-06 MED ORDER — LEVOTHYROXINE SODIUM 75 MCG PO TABS: 75.0000 ug | ORAL_TABLET | Freq: Every day | ORAL | Status: DC

## 2015-09-07 NOTE — Progress Notes (Signed)
Informed patient she has iron deficiency anemia and needs to come by the office and pick up some hemoccult cards to test for blood in her stool. Patient will come by office and pick those up.

## 2015-09-26 ENCOUNTER — Telehealth: Payer: Self-pay

## 2015-09-26 NOTE — Telephone Encounter (Signed)
Pt came into 102 and gave me her new [employer] ins info for next year and asked me to go ahead and do a PA for the Stadol. I started it on covermymeds and copied the following from the PA done last year to be included in this year's PA:  Pt was started on this medication regime/dose by neurologist/ headache specialists at [facility] in [place] over 10 years ago (the start date above is approximate). it has been effective for pt all of this time w/no adverse SEs. Prior to this successful medication regime, pt tried/failed hydrocodone, oxycodone, Demerol, methadone, all triptans, Topamax, ibuprofen and other NSAIDS. Dr Laney Pastor is continuing the regimen which has been effective for pt. The Rx is written for 2.5 ml bottle PER WEEK, which equals 10.8 ml per month.  Covermymeds would not recognize pt as of this time. This will need to wait until her coverage starts the first of the year. Called and advised pt of status.

## 2015-10-04 NOTE — Telephone Encounter (Signed)
Completed PA on covermymeds. Pending. 

## 2015-10-05 NOTE — Telephone Encounter (Signed)
PA approved through 10/04/16. Notified pt.

## 2015-10-06 ENCOUNTER — Other Ambulatory Visit: Payer: Self-pay

## 2015-10-06 DIAGNOSIS — E039 Hypothyroidism, unspecified: Secondary | ICD-10-CM

## 2015-10-06 DIAGNOSIS — E1129 Type 2 diabetes mellitus with other diabetic kidney complication: Secondary | ICD-10-CM

## 2015-10-06 MED ORDER — METFORMIN HCL 1000 MG PO TABS: 1000.0000 mg | ORAL_TABLET | Freq: Two times a day (BID) | ORAL | Status: DC

## 2015-10-06 MED ORDER — LOSARTAN POTASSIUM 50 MG PO TABS: 50.0000 mg | ORAL_TABLET | Freq: Every day | ORAL | Status: DC

## 2015-10-06 MED ORDER — ATORVASTATIN CALCIUM 20 MG PO TABS: 20.0000 mg | ORAL_TABLET | Freq: Every day | ORAL | Status: DC

## 2015-10-06 MED ORDER — LEVOTHYROXINE SODIUM 75 MCG PO TABS: 75.0000 ug | ORAL_TABLET | Freq: Every day | ORAL | Status: DC

## 2015-10-11 ENCOUNTER — Telehealth: Payer: Self-pay

## 2015-10-11 MED ORDER — BUTORPHANOL TARTRATE 10 MG/ML NA SOLN: 1.0000 | NASAL | Status: DC | PRN

## 2015-10-11 NOTE — Telephone Encounter (Signed)
Pt in need of her STADOL 10 MG/ML. Please call [phone] when ready for pick up

## 2015-10-11 NOTE — Telephone Encounter (Signed)
Notified pt ready. 

## 2015-10-11 NOTE — Telephone Encounter (Signed)
Meds ordered this encounter  Medications  . butorphanol (STADOL) 10 MG/ML nasal spray    Sig: Place 1 spray into the nose every 4 (four) hours as needed for headache. May refill weekly    Dispense:  2.5 mL    Refill:  5

## 2015-10-16 ENCOUNTER — Telehealth: Payer: Self-pay | Admitting: Physician Assistant

## 2015-10-16 MED ORDER — BUTORPHANOL TARTRATE 10 MG/ML NA SOLN: 1.0000 | NASAL | Status: DC | PRN

## 2015-10-16 NOTE — Telephone Encounter (Signed)
I provided Joannn with this Rx and had her sign it out in the book at 102. Informed her that Dr. Laney Pastor will be out this week and she was given a Rx for a week with no refills and she would need to f/u with Dr. Laney Pastor for refills. No questions at this time.

## 2015-10-16 NOTE — Telephone Encounter (Signed)
Rx printed and signed by Dr. Laney Pastor on 10/11/2015 not found when patient arrived for pick-up. Spoke with Dr. Laney Pastor by phone.  Re-written, but without refills, on his behalf, as he is out of the office this week.  Meds ordered this encounter  Medications  . butorphanol (STADOL) 10 MG/ML nasal spray    Sig: Place 1 spray into the nose every 4 (four) hours as needed for headache. May refill weekly    Dispense:  2.5 mL    Refill:  0    Order Specific Question:  Supervising Provider    Answer:  DOOLITTLE, ROBERT P [id number]

## 2015-10-18 NOTE — Telephone Encounter (Signed)
Pt called looking for this orig Rx since it could not be found when she came to p/up on 1/15. A new Rx was supplied to her, but did not have RFs on it and Dr Laney Pastor will not be back by the time another is needed (these are weekly RFs). I checked in p/up drawer, as I documented that I notified pt ready below and I do not do so unless I have put the Rx in the drawer and signed into log book. I found the orig Rx in envelope right where I had put it under the "Hs", and called pt to notify. The envelope beside it had not been taped shut and sometimes other envelopes can be hidden under these open flaps.

## 2015-11-15 ENCOUNTER — Telehealth: Payer: Self-pay

## 2015-11-15 NOTE — Telephone Encounter (Signed)
Pt states she had lost her written prescription for LUMINAL 97.2 MG. Would like to have a 90 day supply . Please call [place]

## 2015-11-21 ENCOUNTER — Telehealth: Payer: Self-pay | Admitting: Family Medicine

## 2015-11-21 DIAGNOSIS — Z01411 Encounter for gynecological examination (general) (routine) with abnormal findings: Secondary | ICD-10-CM | POA: Diagnosis not present

## 2015-11-21 DIAGNOSIS — Z124 Encounter for screening for malignant neoplasm of cervix: Secondary | ICD-10-CM | POA: Diagnosis not present

## 2015-11-21 DIAGNOSIS — N952 Postmenopausal atrophic vaginitis: Secondary | ICD-10-CM | POA: Diagnosis not present

## 2015-11-21 DIAGNOSIS — Z01419 Encounter for gynecological examination (general) (routine) without abnormal findings: Secondary | ICD-10-CM | POA: Diagnosis not present

## 2015-11-21 DIAGNOSIS — N9411 Superficial (introital) dyspareunia: Secondary | ICD-10-CM | POA: Diagnosis not present

## 2015-11-21 DIAGNOSIS — B373 Candidiasis of vulva and vagina: Secondary | ICD-10-CM | POA: Diagnosis not present

## 2015-11-21 DIAGNOSIS — N762 Acute vulvitis: Secondary | ICD-10-CM | POA: Diagnosis not present

## 2015-11-21 DIAGNOSIS — Z1231 Encounter for screening mammogram for malignant neoplasm of breast: Secondary | ICD-10-CM | POA: Diagnosis not present

## 2015-11-21 DIAGNOSIS — Z1151 Encounter for screening for human papillomavirus (HPV): Secondary | ICD-10-CM | POA: Diagnosis not present

## 2015-11-21 MED ORDER — BUTORPHANOL TARTRATE 10 MG/ML NA SOLN: 1.0000 | NASAL | Status: DC | PRN

## 2015-11-21 NOTE — Telephone Encounter (Signed)
Meds ordered this encounter  Medications  . butorphanol (STADOL) 10 MG/ML nasal spray    Sig: Place 1 spray into the nose every 4 (four) hours as needed for headache. May refill weekly    Dispense:  2.5 mL    Refill:  3  looking back this rx has not be written correctly recently so: Meds ordered this encounter  Medications  . DISCONTD: butorphanol (STADOL) 10 MG/ML nasal spray    Sig: Place 1 spray into the nose every 4 (four) hours as needed for headache. May refill weekly    Dispense:  2.5 mL    Refill:  3  . butorphanol (STADOL) 10 MG/ML nasal spray    Sig: Place 1 spray into the nose every 4 (four) hours as needed for headache. Represents a 3 month supply    Dispense:  2.5 mL    Refill:  5   i think she gets it q 2weeks so 6 vials  Will Last 12 weeks

## 2015-11-21 NOTE — Telephone Encounter (Signed)
Patient request refill on Stadol

## 2015-11-22 MED ORDER — BUTORPHANOL TARTRATE 10 MG/ML NA SOLN: 1.0000 | NASAL | Status: DC | PRN

## 2015-11-22 NOTE — Telephone Encounter (Signed)
Notified pt ready. Rx correctly written for 2.5 ml Q week x 1 mos. Notified pt on Vm.

## 2015-11-27 ENCOUNTER — Other Ambulatory Visit: Payer: Self-pay | Admitting: Internal Medicine

## 2015-11-27 MED ORDER — BUTORPHANOL TARTRATE 10 MG/ML NA SOLN: 1.0000 | NASAL | Status: DC | PRN

## 2015-11-27 MED ORDER — PHENOBARBITAL 97.2 MG PO TABS: ORAL_TABLET | ORAL | Status: DC

## 2015-11-29 DIAGNOSIS — H35412 Lattice degeneration of retina, left eye: Secondary | ICD-10-CM | POA: Diagnosis not present

## 2015-11-29 DIAGNOSIS — H524 Presbyopia: Secondary | ICD-10-CM | POA: Diagnosis not present

## 2015-11-29 DIAGNOSIS — H52223 Regular astigmatism, bilateral: Secondary | ICD-10-CM | POA: Diagnosis not present

## 2015-11-29 DIAGNOSIS — H5213 Myopia, bilateral: Secondary | ICD-10-CM | POA: Diagnosis not present

## 2015-11-29 DIAGNOSIS — H4422 Degenerative myopia, left eye: Secondary | ICD-10-CM | POA: Diagnosis not present

## 2015-11-29 DIAGNOSIS — Z7984 Long term (current) use of oral hypoglycemic drugs: Secondary | ICD-10-CM | POA: Diagnosis not present

## 2015-11-29 DIAGNOSIS — E119 Type 2 diabetes mellitus without complications: Secondary | ICD-10-CM | POA: Diagnosis not present

## 2015-11-29 DIAGNOSIS — H3540 Unspecified peripheral retinal degeneration: Secondary | ICD-10-CM | POA: Diagnosis not present

## 2015-12-15 ENCOUNTER — Telehealth: Payer: Self-pay

## 2015-12-15 MED ORDER — ONDANSETRON 8 MG PO TBDP: ORAL_TABLET | ORAL | Status: DC

## 2015-12-15 NOTE — Telephone Encounter (Signed)
Done

## 2015-12-15 NOTE — Telephone Encounter (Signed)
Pt states a refill request for zofran was supposed to be requested on Friday by the pharmacy and she is checking on status  Best number [phone]

## 2015-12-20 ENCOUNTER — Other Ambulatory Visit: Payer: Self-pay

## 2015-12-20 MED ORDER — ONDANSETRON 8 MG PO TBDP: ORAL_TABLET | ORAL | Status: DC

## 2015-12-26 ENCOUNTER — Other Ambulatory Visit: Payer: Self-pay | Admitting: Internal Medicine

## 2015-12-29 NOTE — Telephone Encounter (Signed)
Pt has upcoming ov 01/04/16 Pt called to check the status of              Original Order:  zolpidem (AMBIEN) 10 MG tablet [id number]        Pharmacy:  [facility], [address]

## 2015-12-30 ENCOUNTER — Other Ambulatory Visit: Payer: Self-pay

## 2015-12-30 NOTE — Telephone Encounter (Signed)
Pharm reqs RFs of zolpidem. Pended.

## 2015-12-31 MED ORDER — ZOLPIDEM TARTRATE 10 MG PO TABS: ORAL_TABLET | ORAL | Status: DC

## 2016-01-02 NOTE — Telephone Encounter (Signed)
Faxed

## 2016-01-04 ENCOUNTER — Ambulatory Visit (INDEPENDENT_AMBULATORY_CARE_PROVIDER_SITE_OTHER): Payer: Medicare Other | Admitting: Internal Medicine

## 2016-01-04 VITALS — BP 159/92 | HR 101 | Temp 98.0°F | Resp 16 | Ht <= 58 in | Wt 153.0 lb

## 2016-01-04 DIAGNOSIS — E038 Other specified hypothyroidism: Secondary | ICD-10-CM

## 2016-01-04 DIAGNOSIS — G40909 Epilepsy, unspecified, not intractable, without status epilepticus: Secondary | ICD-10-CM

## 2016-01-04 DIAGNOSIS — G4700 Insomnia, unspecified: Secondary | ICD-10-CM

## 2016-01-04 DIAGNOSIS — E039 Hypothyroidism, unspecified: Secondary | ICD-10-CM | POA: Diagnosis not present

## 2016-01-04 DIAGNOSIS — D649 Anemia, unspecified: Secondary | ICD-10-CM

## 2016-01-04 DIAGNOSIS — I10 Essential (primary) hypertension: Secondary | ICD-10-CM

## 2016-01-04 DIAGNOSIS — E1129 Type 2 diabetes mellitus with other diabetic kidney complication: Secondary | ICD-10-CM

## 2016-01-04 DIAGNOSIS — E785 Hyperlipidemia, unspecified: Secondary | ICD-10-CM

## 2016-01-04 DIAGNOSIS — G43711 Chronic migraine without aura, intractable, with status migrainosus: Secondary | ICD-10-CM | POA: Diagnosis not present

## 2016-01-04 DIAGNOSIS — G43719 Chronic migraine without aura, intractable, without status migrainosus: Secondary | ICD-10-CM

## 2016-01-04 LAB — CBC WITH DIFFERENTIAL/PLATELET
BASOS ABS: 0 {cells}/uL (ref 0–200)
Basophils Relative: 0 %
EOS ABS: 212 {cells}/uL (ref 15–500)
Eosinophils Relative: 2 %
HCT: 31.6 % — ABNORMAL LOW (ref 35.0–45.0)
Hemoglobin: 9.8 g/dL — ABNORMAL LOW (ref 11.7–15.5)
LYMPHS PCT: 21 %
Lymphs Abs: 2226 cells/uL (ref 850–3900)
MCH: 26.2 pg — AB (ref 27.0–33.0)
MCHC: 31 g/dL — ABNORMAL LOW (ref 32.0–36.0)
MCV: 84.5 fL (ref 80.0–100.0)
MONOS PCT: 9 %
MPV: 9.8 fL (ref 7.5–12.5)
Monocytes Absolute: 954 cells/uL — ABNORMAL HIGH (ref 200–950)
NEUTROS PCT: 68 %
Neutro Abs: 7208 cells/uL (ref 1500–7800)
PLATELETS: 365 10*3/uL (ref 140–400)
RBC: 3.74 MIL/uL — ABNORMAL LOW (ref 3.80–5.10)
RDW: 15 % (ref 11.0–15.0)
WBC: 10.6 10*3/uL (ref 3.8–10.8)

## 2016-01-04 LAB — LIPID PANEL
CHOL/HDL RATIO: 2.5 ratio (ref <=5.0)
Cholesterol: 161 mg/dL (ref 125–200)
HDL: 64 mg/dL (ref >=46)
LDL Cholesterol: 74 mg/dL (ref <130)
Triglycerides: 116 mg/dL (ref <150)
VLDL: 23 mg/dL (ref <30)

## 2016-01-04 LAB — COMPREHENSIVE METABOLIC PANEL
ALT: 10 U/L (ref 6–29)
AST: 12 U/L (ref 10–35)
Albumin: 4.1 g/dL (ref 3.6–5.1)
Alkaline Phosphatase: 65 U/L (ref 33–130)
BUN: 17 mg/dL (ref 7–25)
CO2: 24 mmol/L (ref 20–31)
Calcium: 10.2 mg/dL (ref 8.6–10.4)
Chloride: 106 mmol/L (ref 98–110)
Creat: 0.99 mg/dL (ref 0.50–0.99)
Glucose, Bld: 136 mg/dL — ABNORMAL HIGH (ref 65–99)
Potassium: 5.6 mmol/L — ABNORMAL HIGH (ref 3.5–5.3)
Sodium: 139 mmol/L (ref 135–146)
Total Bilirubin: 0.2 mg/dL (ref 0.2–1.2)
Total Protein: 7.1 g/dL (ref 6.1–8.1)

## 2016-01-04 LAB — POCT GLYCOSYLATED HEMOGLOBIN (HGB A1C): Hemoglobin A1C: 7.2

## 2016-01-04 MED ORDER — PHENOBARBITAL 97.2 MG PO TABS: ORAL_TABLET | ORAL | Status: DC

## 2016-01-04 MED ORDER — LOSARTAN POTASSIUM 50 MG PO TABS: 50.0000 mg | ORAL_TABLET | Freq: Every day | ORAL | Status: DC

## 2016-01-04 MED ORDER — LEVOTHYROXINE SODIUM 75 MCG PO TABS: 75.0000 ug | ORAL_TABLET | Freq: Every day | ORAL | Status: DC

## 2016-01-04 MED ORDER — METFORMIN HCL 1000 MG PO TABS: 1000.0000 mg | ORAL_TABLET | Freq: Two times a day (BID) | ORAL | Status: DC

## 2016-01-04 MED ORDER — BUTORPHANOL TARTRATE 10 MG/ML NA SOLN: 1.0000 | NASAL | Status: DC | PRN

## 2016-01-04 MED ORDER — ATORVASTATIN CALCIUM 20 MG PO TABS: 20.0000 mg | ORAL_TABLET | Freq: Every day | ORAL | Status: DC

## 2016-01-04 MED ORDER — ZOLPIDEM TARTRATE 10 MG PO TABS: ORAL_TABLET | ORAL | Status: DC

## 2016-01-04 NOTE — Progress Notes (Signed)
Subjective:    Patient ID: Natalie [name], female    DOB: 1951/02/08, 65 y.o.   MRN: [id number]  HPI here for follow-up of multiple problems with medicine refills Patient Active Problem List   Diagnosis Date Noted  . 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 BMI 33.0-33.9,adult 03/30/2013  . Recurrent UTI- None recent  09/17/2012  . HTN (hypertension)--She has had variable pressures and has even been hypotensive at times //she is only on losartan 50 at this point///metoprolol discontinued July 2016 ///she needs aggressive control  03/19/2012  . Hyperlipemia--On Lipitor 20 with good results  03/19/2012  . Hypothyroidism---On Synthroid 75 g with good laboratory response  03/19/2012  . Seizure disorder (HCC)--this has not been a problem in many years as continues on low-dose phenobarbital. She has tried to withdraw from this 2 or 3 times given the advice by neurology but has had a seizure every time or at least a pre-seizure feeling(hand shaking).  03/19/2012  . Insomnia--- has responded 10 mg of Ambien for without any side effects //good sleep helps decrease freq of migraines 03/19/2012  . Migraine--- chronic and intractable ///chart review over the last 3 decades reveal care with multiple admissions at [facility] Dr. Arlyce Dice stabilized with his use of Stadol as a rescue medication at home --- he referred her to [place] to continue this medication and felt no other changes necessary. This was in the late 90s and she has been stable on this regimen ever since. She has had no office visits for acute headaches in over a decade when she was in the clinic once or twice a week for injectable narcotics prior to beginning Stadol in the 90s . 03/19/2012    -  Anemia---chronic///see sl low fe 11/16--normocytic   Granddaughter now stable from recent psychiatric events/Junior in high  school, 6 in her class Summer experience will be internship in medical robotics in [place] for custody continues to be a serious stress or.   Review of Systems  Constitutional: Negative for fever, fatigue and unexpected weight change.  HENT: Negative for hearing loss and trouble swallowing.   Eyes: Negative for photophobia and visual disturbance.  Respiratory: Negative for cough, choking and shortness of breath.   Cardiovascular: Negative for chest pain, palpitations and leg swelling.  Gastrointestinal: Negative for abdominal pain, diarrhea and constipation.  Genitourinary: Negative for frequency and difficulty urinating.  Musculoskeletal: Negative for back pain and gait problem.  Neurological: Negative for tremors, speech difficulty and weakness.  Hematological: Negative for adenopathy. Does not bruise/bleed easily.  Psychiatric/Behavioral: Negative for hallucinations and dysphoric mood.       Objective:   Physical Exam BP 159/92 mmHg  Pulse 101  Temp(Src) 98 F (36.7 C)  Resp 16  Ht 4\' 8"  (1.422 m)  Wt 153 lb (69.4 kg)  BMI 34.32 kg/m2 Wt Readings from Last 3 Encounters:  01/04/16 153 lb (69.4 kg)  08/31/15 151 lb (68.493 kg)  08/08/15 152 lb (68.947 kg)  HEENT clear Heart regular No carotid bruits Abdomen benign Extremities with good peripheral pulses and no edema No loss of plantar sensation Neurological intact Mood good and affect appropriate     Assessment & Plan:  Type 2 diabetes mellitus with other diabetic kidney complication (HCC) --123456 today=7.2 -Continue metformin/continue efforts at weight loss  Insomnia-continue Ambien  Seizure disorder (HCC)--continue phenobarbital  Hyperlipemia - lipids today  Hypothyroidism--TSH  today  Anemia, chronic--iron supplements  Essential hypertension -now uncontrolled//she will check home blood pressures 3 times a week for the next few weeks and let me know the results to see if we need to increase  her Cozaar//she needs aggressive control  Migraine, chronic, without aura, intractable, with status migrainosus--continue Stadol  To [facility] ordered this encounter  Medications  . zolpidem (AMBIEN) 10 MG tablet    Sig: take 1 tablet by mouth at bedtime for sleep if needed    Dispense:  30 tablet    Refill:  5  . butorphanol (STADOL) 10 MG/ML nasal spray    Sig: Place 1 spray into the nose every 4 (four) hours as needed for migraine. May refill weekly.    Dispense:  2.5 mL    Refill:  5   To [facility]  Meds ordered this encounter  Medications  . atorvastatin (LIPITOR) 20 MG tablet    Sig: Take 1 tablet (20 mg total) by mouth daily.    Dispense:  90 tablet    Refill:  3  . levothyroxine (SYNTHROID, LEVOTHROID) 75 MCG tablet    Sig: Take 1 tablet (75 mcg total) by mouth daily.    Dispense:  90 tablet    Refill:  3  . losartan (COZAAR) 50 MG tablet    Sig: Take 1 tablet (50 mg total) by mouth daily.    Dispense:  90 tablet    Refill:  3  . metFORMIN (GLUCOPHAGE) 1000 MG tablet    Sig: Take 1 tablet (1,000 mg total) by mouth 2 (two) times daily with a meal.    Dispense:  180 tablet    Refill:  3  . PHENobarbital (LUMINAL) 97.2 MG tablet    Sig: TAKE TWO TABLETS BY MOUTH ONCE DAILY ON  MON,  WED,  AND  FRI  AND  1  TABLET  DAILY  ON  OTHER  DAYS    Dispense:  135 tablet    Refill:  3   She will consider scheduling Colonoscopy, She believes her ophthalmology follow-up has been recent-we have no report, We discussed follow-up with the PCP as internal medicine near where she lives in [place]

## 2016-01-04 NOTE — Patient Instructions (Addendum)
Psychiatry----Dr Milana Huntsman ---[facility] [phone] for Molly    IF you received an x-ray today, you will receive an invoice from [facility]. Please contact [facility] at [phone] with questions or concerns regarding your invoice.   IF you received labwork today, you will receive an invoice from [employer]. Please contact [facility] at [phone] with questions or concerns regarding your invoice.   Our billing staff will not be able to assist you with questions regarding bills from these companies.  You will be contacted with the lab results as soon as they are available. The fastest way to get your results is to activate your My Chart account. Instructions are located on the last page of this paperwork. If you have not heard from [place] regarding the results in 2 weeks, please contact this office.

## 2016-01-05 ENCOUNTER — Encounter: Payer: Self-pay | Admitting: Internal Medicine

## 2016-01-05 LAB — TSH: TSH: 2.64 mIU/L

## 2016-01-20 ENCOUNTER — Ambulatory Visit (INDEPENDENT_AMBULATORY_CARE_PROVIDER_SITE_OTHER): Payer: Medicare Other | Admitting: Internal Medicine

## 2016-01-20 VITALS — BP 166/80 | HR 92 | Temp 98.7°F | Resp 16 | Ht <= 58 in | Wt 158.0 lb

## 2016-01-20 DIAGNOSIS — G43709 Chronic migraine without aura, not intractable, without status migrainosus: Secondary | ICD-10-CM | POA: Diagnosis not present

## 2016-01-20 DIAGNOSIS — E785 Hyperlipidemia, unspecified: Secondary | ICD-10-CM | POA: Diagnosis not present

## 2016-01-20 DIAGNOSIS — E039 Hypothyroidism, unspecified: Secondary | ICD-10-CM

## 2016-01-20 DIAGNOSIS — E1129 Type 2 diabetes mellitus with other diabetic kidney complication: Secondary | ICD-10-CM

## 2016-01-20 DIAGNOSIS — G4700 Insomnia, unspecified: Secondary | ICD-10-CM

## 2016-01-20 DIAGNOSIS — N390 Urinary tract infection, site not specified: Secondary | ICD-10-CM

## 2016-01-20 NOTE — Patient Instructions (Signed)
     IF you received an x-ray today, you will receive an invoice from [facility]. Please contact [facility] at [phone] with questions or concerns regarding your invoice.   IF you received labwork today, you will receive an invoice from [facility]. Please contact [facility] at [phone] with questions or concerns regarding your invoice.   Our billing staff will not be able to assist you with questions regarding bills from these companies.  You will be contacted with the lab results as soon as they are available. The fastest way to get your results is to activate your My Chart account. Instructions are located on the last page of this paperwork. If you have not heard from us regarding the results in 2 weeks, please contact this office.      

## 2016-01-20 NOTE — Progress Notes (Signed)
Subjective:  By signing my name below, I, [name], attest that this documentation has been prepared under the direction and in the presence of [name], MD.  Electronically Signed: Thea Alken, ED Scribe. 01/20/2016. 9:23 AM.  Patient ID: Natalie [name], female    DOB: 17-Apr-1951, 65 y.o.   MRN: [id number]  HPI   Chief Complaint  Patient presents with  . Would like referral for new PCP   HPI Comments: Natalie [name] is a 65 y.o. female who presents to the [facility] for a referral In for discussion about ongoing medical issues.  Pt would like a referral for a new PCP. She received a list of providers but states a lot of them are not accepting pt's at this time. She has an appointment with Dr. Fulton Reek at [facility] next week to see if provider is a good fit for her. She is also looking for a new psychiatrist and would like some suggestions.   Pt BP is 166/80 today. She states at home she has lower readings. Diastolic in the low [id number] and systolic below [id number].  Pt would like a refill of clobetasol.  Pt is taking iron. Note history of anemia thought to be secondary to chronic illnesses.  Patient Active Problem List   Diagnosis Date Noted  . Type 2 diabetes mellitus with other diabetic kidney complication ([place]) [id number]  . Migraine, chronic, without aura, intractable, with status migrainosus 01/04/2016  . Abdominal pain   . AKI (acute kidney injury) ([place])   . Pyelonephritis 08/31/2014  . Persistent microalbuminuria associated with type 2 diabetes mellitus ([place]) 12/28/2013  . BMI 33.0-33.9,adult 03/30/2013  . Recurrent UTI 09/17/2012  . DM (diabetes mellitus) ([place]) 03/19/2012  . HTN (hypertension) 03/19/2012  . Hyperlipemia 03/19/2012  . Hypothyroidism 03/19/2012  . Seizure disorder ([place]) 03/19/2012  . Insomnia 03/19/2012  . Migraine 03/19/2012   Past Medical History  Diagnosis Date  . Hyperlipidemia   . Hypertension   . Diabetes  mellitus without complication [facility])    Past Surgical History  Procedure Laterality Date  . Cholecystectomy    . Cesarean section     Allergies  Allergen Reactions  . Compazine   . Cymbalta [Duloxetine Hcl]   . Escitalopram Oxalate Other (See Comments)    Can't sleep  . Methadone Hives  . Nitrofuran Derivatives    Prior to Admission medications   Medication Sig Start Date End Date Taking? Authorizing Provider  atorvastatin (LIPITOR) 20 MG tablet Take 1 tablet (20 mg total) by mouth daily. 01/04/16  Yes Leandrew Koyanagi, MD  butorphanol (STADOL) 10 MG/ML nasal spray Place 1 spray into the nose every 4 (four) hours as needed for migraine. May refill weekly. 01/04/16  Yes Leandrew Koyanagi, MD  ESTRACE VAGINAL 0.1 MG/GM vaginal cream insert 1/4 gram vaginally two times a week 12/03/15  Yes Historical Provider, MD  levothyroxine (SYNTHROID, LEVOTHROID) 75 MCG tablet Take 1 tablet (75 mcg total) by mouth daily. 01/04/16  Yes Leandrew Koyanagi, MD  losartan (COZAAR) 50 MG tablet Take 1 tablet (50 mg total) by mouth daily. 01/04/16  Yes Leandrew Koyanagi, MD  metFORMIN (GLUCOPHAGE) 1000 MG tablet Take 1 tablet (1,000 mg total) by mouth 2 (two) times daily with a meal. 01/04/16  Yes Leandrew Koyanagi, MD  ondansetron (ZOFRAN-ODT) 8 MG disintegrating tablet DISSOLVE 1 TABLET ON OR UNDER THE TONGUE EVERY 8 HOURS AS NEEDED FOR NAUSEA 12/29/15  Yes Leandrew Koyanagi, MD  PHENobarbital (LUMINAL) 97.2 MG tablet TAKE TWO TABLETS BY MOUTH ONCE DAILY ON  MON,  WED,  AND  FRI  AND  1  TABLET  DAILY  ON  OTHER  DAYS 01/04/16  Yes Leandrew Koyanagi, MD  zolpidem (AMBIEN) 10 MG tablet take 1 tablet by mouth at bedtime for sleep if needed 01/04/16  Yes Leandrew Koyanagi, MD  clobetasol cream (TEMOVATE) 0.05 % Reported on 01/20/2016 12/03/15   Historical Provider, MD  terconazole (TERAZOL 3) 0.8 % vaginal cream Reported on 01/20/2016 12/05/15   Historical Provider, MD   Review of Systems   Objective:  Physical Exam    Constitutional: She is oriented to person, place, and time. She appears well-developed and well-nourished. No distress.  HENT:  Head: Normocephalic and atraumatic.  Eyes: Conjunctivae and EOM are normal.  Neck: Neck supple.  Cardiovascular: Normal rate.   Pulmonary/Chest: Effort normal.  Musculoskeletal: Normal range of motion.  Neurological: She is alert and oriented to person, place, and time.  Skin: Skin is warm and dry.  Psychiatric: She has a normal mood and affect. Her behavior is normal.  Nursing note and vitals reviewed.   Filed Vitals:   01/20/16 0849  BP: 166/80  Pulse: 92  Temp: 98.7 F (37.1 C)  TempSrc: Oral  Resp: 16  Height: 4\' 8"  (1.422 m)  Weight: 158 lb (71.668 kg)  SpO2: 97%    Assessment & Plan:  I have completed the patient encounter in its entirety as documented by the scribe, with editing by me where necessary. Fatiha Guzy P. Laney Pastor, M.D. Type 2 diabetes mellitus with other diabetic kidney complication (HCC) Hypothyroidism, unspecified hypothyroidism type Insomnia Chronic migraine without aura without status migrainosus, not intractable Recurrent UTI Hyperlipemia  Meds ordered this encounter  Medications  . clobetasol cream (TEMOVATE) 0.05 %    Sig: Apply topically 2 (two) times daily.    Dispense:  30 g    Refill:  1   Letter to Dr. Doy Hutching for introduction

## 2016-01-21 MED ORDER — CLOBETASOL PROPIONATE 0.05 % EX CREA: TOPICAL_CREAM | Freq: Two times a day (BID) | CUTANEOUS | Status: DC

## 2016-01-24 ENCOUNTER — Other Ambulatory Visit: Payer: Self-pay | Admitting: Internal Medicine

## 2016-01-24 DIAGNOSIS — E079 Disorder of thyroid, unspecified: Secondary | ICD-10-CM | POA: Diagnosis not present

## 2016-01-24 DIAGNOSIS — I10 Essential (primary) hypertension: Secondary | ICD-10-CM | POA: Diagnosis not present

## 2016-01-24 DIAGNOSIS — F5104 Psychophysiologic insomnia: Secondary | ICD-10-CM | POA: Diagnosis not present

## 2016-01-24 DIAGNOSIS — D509 Iron deficiency anemia, unspecified: Secondary | ICD-10-CM | POA: Diagnosis not present

## 2016-01-24 DIAGNOSIS — R8290 Unspecified abnormal findings in urine: Secondary | ICD-10-CM | POA: Diagnosis not present

## 2016-01-24 DIAGNOSIS — Z1211 Encounter for screening for malignant neoplasm of colon: Secondary | ICD-10-CM | POA: Diagnosis not present

## 2016-01-24 DIAGNOSIS — Z79899 Other long term (current) drug therapy: Secondary | ICD-10-CM | POA: Diagnosis not present

## 2016-01-24 DIAGNOSIS — E1121 Type 2 diabetes mellitus with diabetic nephropathy: Secondary | ICD-10-CM | POA: Diagnosis not present

## 2016-01-24 DIAGNOSIS — R569 Unspecified convulsions: Secondary | ICD-10-CM | POA: Diagnosis not present

## 2016-01-24 DIAGNOSIS — E7801 Familial hypercholesterolemia: Secondary | ICD-10-CM | POA: Diagnosis not present

## 2016-01-26 ENCOUNTER — Other Ambulatory Visit: Payer: Self-pay | Admitting: Internal Medicine

## 2016-01-31 DIAGNOSIS — Z1211 Encounter for screening for malignant neoplasm of colon: Secondary | ICD-10-CM | POA: Diagnosis not present

## 2016-02-07 ENCOUNTER — Other Ambulatory Visit: Payer: Self-pay | Admitting: Internal Medicine

## 2016-02-07 MED ORDER — BUTORPHANOL TARTRATE 10 MG/ML NA SOLN: 1.0000 | NASAL | Status: DC | PRN

## 2016-02-29 ENCOUNTER — Ambulatory Visit (INDEPENDENT_AMBULATORY_CARE_PROVIDER_SITE_OTHER): Payer: Medicare Other | Admitting: Internal Medicine

## 2016-02-29 VITALS — BP 168/82 | HR 82 | Temp 97.9°F | Resp 17 | Ht <= 58 in | Wt 159.0 lb

## 2016-02-29 DIAGNOSIS — Z0189 Encounter for other specified special examinations: Secondary | ICD-10-CM

## 2016-02-29 DIAGNOSIS — I10 Essential (primary) hypertension: Secondary | ICD-10-CM

## 2016-02-29 DIAGNOSIS — G43711 Chronic migraine without aura, intractable, with status migrainosus: Secondary | ICD-10-CM

## 2016-02-29 MED ORDER — BUTORPHANOL TARTRATE 10 MG/ML NA SOLN: 1.0000 | NASAL | Status: DC | PRN

## 2016-02-29 NOTE — Patient Instructions (Signed)
     IF you received an x-ray today, you will receive an invoice from [facility]. Please contact [facility] at [phone] with questions or concerns regarding your invoice.   IF you received labwork today, you will receive an invoice from [facility]. Please contact [facility] at [phone] with questions or concerns regarding your invoice.   Our billing staff will not be able to assist you with questions regarding bills from these companies.  You will be contacted with the lab results as soon as they are available. The fastest way to get your results is to activate your My Chart account. Instructions are located on the last page of this paperwork. If you have not heard from us regarding the results in 2 weeks, please contact this office.     We recommend that you schedule a mammogram for breast cancer screening. Typically, you do not need a referral to do this. Please contact a local imaging center to schedule your mammogram.  [facility] - [phone]  *ask for the Radiology Department [facility] - [phone] or [phone]  [facility] - [phone] [facility] - [phone] [facility] - [phone]  *ask for the Radiology Department [facility] - [phone]  *ask for the Radiology Department [facility] - [phone]  *ask for the Mammography Department [facility] - [phone]  

## 2016-02-29 NOTE — Progress Notes (Signed)
By signing my name below, I, [name], attest that this documentation has been prepared under the direction and in the presence of [name], MD.  Electronically Signed: [name], Medical Scribe. 02/29/2016. 4:05 PM.  Subjective:    Patient ID: Natalie [name], female    DOB: 16-Feb-1951, 65 y.o.   MRN: [id number]  HPI Chief Complaint  Patient presents with  . Medication Refill    stadol  . Referral    HPI Comments: Natalie [name] is a 65 y.o. female who [facility] for a medication refill. Pt would like to get a referral for further Consideration of concurrent treatment protocol for chronic headaches.  She was referred to [place] to continue the therapy started at [facility] headache clinic by Dr. Collene Mares  many years ago. She had chronic daily headaches with superimposed full blown migraines including nausea vomiting and photophobia. She needed 4-8 visits to the emergency room or to our clinic monthly for narcotic injections despite the many therapies tried by Dr. Collene Mares (including hospitalization forDHE washout on more than one occasion). Eventually she was stabilized with the use of when necessary Stadol and was referred to [place] to continue that therapy by Dr. Collene Mares. She has been stable on this regimen and has not required injection therapy or emergency room visit in over a decade. She continues with daily headaches though requires medication use only for the more severe headaches which occur 2-3 times a week. She has been able to maintain her life with the control of these headaches.  As I am retiring at the end of the month, She has recently started with a new primary care physician Dr Fulton Reek at [facility], however she says he is not comfortable prescribing more than 1 vial of Stadol for a month. She has been on one vial weekly for many years. She is unsure what to do facing the dilemma of narcotic withdrawal as well as loss of control of  her headaches. Filled last Stadol 5/15.  Dr Doy Hutching is addressing her other chronic medical problems and Her next appointment is in October.     Patient Active Problem List   Diagnosis Date Noted  . Type 2 diabetes mellitus with other diabetic kidney complication ([place]) Q000111Q  . Migraine, chronic, without aura, intractable, with status migrainosus 01/04/2016  . Abdominal pain   . AKI (acute kidney injury) ([place])   . Pyelonephritis 08/31/2014  . 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 BMI 33.0-33.9,adult 03/30/2013  . Recurrent UTI 09/17/2012  . DM (diabetes mellitus) ([place]) 03/19/2012  . HTN (hypertension) 03/19/2012  . Hyperlipemia 03/19/2012  . Hypothyroidism 03/19/2012  . Seizure disorder ([place]) 03/19/2012  . Insomnia 03/19/2012  . Migraine 03/19/2012   Past Medical History  Diagnosis Date  . Hyperlipidemia   . Hypertension   . Diabetes mellitus without complication [facility])    Past Surgical History  Procedure Laterality Date  . Cholecystectomy    . Cesarean section     Allergies  Allergen Reactions  . Compazine   . Cymbalta [Duloxetine Hcl]   . Escitalopram Oxalate Other (See Comments)    Can't sleep  . Methadone Hives  . Nitrofuran Derivatives    Prior to Admission medications   Medication Sig Start Date End Date Taking? Authorizing Provider  atorvastatin (LIPITOR) 20 MG tablet Take 1 tablet (20 mg total) by mouth daily. 01/04/16  Yes Leandrew Koyanagi, MD  butorphanol (STADOL)  10 MG/ML nasal spray Place 1 spray into the nose every 4 (four) hours as needed for migraine. May refill weekly. To follow last prescription of 01/04/16. 02/07/16  Yes Leandrew Koyanagi, MD  clobetasol cream (TEMOVATE) 0.05 % Apply topically 2 (two) times daily. 01/21/16  Yes Leandrew Koyanagi, MD  ESTRACE VAGINAL 0.1 MG/GM vaginal cream insert 1/4 gram vaginally two times a week 12/03/15  Yes Historical Provider, MD  levothyroxine (SYNTHROID,  LEVOTHROID) 75 MCG tablet Take 1 tablet (75 mcg total) by mouth daily. 01/04/16  Yes Leandrew Koyanagi, MD  losartan (COZAAR) 50 MG tablet Take 1 tablet (50 mg total) by mouth daily. 01/04/16  Yes Leandrew Koyanagi, MD  metFORMIN (GLUCOPHAGE) 1000 MG tablet Take 1 tablet (1,000 mg total) by mouth 2 (two) times daily with a meal. 01/04/16  Yes Leandrew Koyanagi, MD  ondansetron (ZOFRAN-ODT) 8 MG disintegrating tablet DISSOLVE 1 TABLET ON OR UNDER THE TONGUE EVERY 8 HOURS AS NEEDED FOR NAUSEA 12/29/15  Yes Leandrew Koyanagi, MD  PHENobarbital (LUMINAL) 97.2 MG tablet TAKE TWO TABLETS BY MOUTH ONCE DAILY ON  MON,  WED,  AND  FRI  AND  1  TABLET  DAILY  ON  OTHER  DAYS 01/04/16  Yes Leandrew Koyanagi, MD  terconazole (TERAZOL 3) 0.8 % vaginal cream Reported on 01/20/2016 12/05/15  Yes Historical Provider, MD  zolpidem (AMBIEN) 10 MG tablet take 1 tablet by mouth at bedtime for sleep if needed 01/04/16  Yes Leandrew Koyanagi, MD   Social History   Social History  . Marital Status: Married    Spouse Name: N/A  . Number of Children: N/A  . Years of Education: N/A   Occupational History  . Not on file.   Social History Main Topics  . Smoking status: Never Smoker   . Smokeless tobacco: Never Used  . Alcohol Use: No  . Drug Use: Not on file  . Sexual Activity: Not on file   Other Topics Concern  . Not on file   Social History Narrative   Review of Systems  Neurological: Positive for headaches (chronic).   Objective:  BP 168/82 mmHg  Pulse 82  Temp(Src) 97.9 F (36.6 C) (Oral)  Resp 17  Ht 4\' 9"  (1.448 m)  Wt 159 lb (72.122 kg)  BMI 34.40 kg/m2  SpO2 98%  Physical Exam  Constitutional: She appears well-developed and well-nourished. No distress.  HENT:  Head: Normocephalic and atraumatic.  Eyes: Conjunctivae are normal.  Neck: Neck supple.  Cardiovascular: Normal rate.   Pulmonary/Chest: Effort normal.  Neurological: She is alert.  Skin: Skin is warm and dry.  Psychiatric: She has a  normal mood and affect. Her behavior is normal.  Nursing note and vitals reviewed.   Assessment & Plan:  Migraine, chronic, without aura, intractable, with status migrainosus--with chronic use of Stadol to control --She is open to reevaluation at the [facility] headache clinic to see if there are new or an better options --She will be referred to pain management [place] to consider other options as well and also to supervise her medication and any need for withdrawing her medication that would be more in line with Dr. Stacie Glaze suggestion.  Essential hypertension --Not currently controlled. She will do home blood pressures to see if there is a difference there and discuss increasing her losartan with Dr. Doy Hutching if needed  Meds ordered this encounter  Medications  . butorphanol (STADOL) 10 MG/ML nasal spray    Sig: Place  1 spray into the nose every 4 (four) hours as needed for migraine. May refill weekly. To follow last prescription of 01/04/16.    Dispense:  2.5 mL    Refill:  5  I have suggested gradual reduction in her use for now to see if she can manage this on her own and control her headaches  I have completed the patient encounter in its entirety as documented by the scribe, with editing by me where necessary. Corinthian Mizrahi P. Laney Pastor, M.D.

## 2016-03-28 DIAGNOSIS — H16143 Punctate keratitis, bilateral: Secondary | ICD-10-CM | POA: Diagnosis not present

## 2016-04-02 ENCOUNTER — Ambulatory Visit (INDEPENDENT_AMBULATORY_CARE_PROVIDER_SITE_OTHER): Payer: Medicare Other | Admitting: Internal Medicine

## 2016-04-02 VITALS — BP 134/82 | HR 103 | Temp 98.5°F | Resp 18 | Ht <= 58 in | Wt 156.0 lb

## 2016-04-02 DIAGNOSIS — G4700 Insomnia, unspecified: Secondary | ICD-10-CM

## 2016-04-02 DIAGNOSIS — G43719 Chronic migraine without aura, intractable, without status migrainosus: Secondary | ICD-10-CM

## 2016-04-02 MED ORDER — BUTORPHANOL TARTRATE 10 MG/ML NA SOLN: 1.0000 | NASAL | Status: DC | PRN

## 2016-04-02 MED ORDER — ZOLPIDEM TARTRATE 10 MG PO TABS: ORAL_TABLET | ORAL | Status: DC

## 2016-04-02 NOTE — Progress Notes (Signed)
   Subjective:    Patient ID: Natalie [name], female    DOB: 1951/05/14, 65 y.o.   MRN: [id number]  HPI concerned about transition of care-new PCP Dr Doy Hutching uncomfortable with prescribing Stadol as she has used for migraines for many many years. Her pharmacy closed as well and needs rx shifted to [facility]. Most meds are mail order.  Med problems are stable   Review of Systems [place]    Objective:   Physical Exam  Constitutional: She is oriented to person, place, and time. She appears well-developed and well-nourished. No distress.  HENT:  Head: Normocephalic and atraumatic.  Eyes: Pupils are equal, round, and reactive to light.  Neck: Normal range of motion.  Cardiovascular: Normal rate and regular rhythm.   Pulmonary/Chest: Effort normal. No respiratory distress.  Musculoskeletal: Normal range of motion.  Neurological: She is alert and oriented to person, place, and time.  Skin: Skin is warm and dry.  Psychiatric: She has a normal mood and affect. Her behavior is normal.  Nursing note and vitals reviewed.   BP 134/82 mmHg  Pulse 103  Temp(Src) 98.5 F (36.9 C) (Oral)  Resp 18  Ht 4\' 9"  (1.448 m)  Wt 156 lb (70.761 kg)  BMI 33.75 kg/m2  SpO2 98%     Assessment & Plan:  Intractable chronic migraine without aura and without status migrainosus  Insomnia  Meds ordered this encounter  Medications  . zolpidem (AMBIEN) 10 MG tablet    Sig: take 1 tablet by mouth at bedtime for sleep if needed    Dispense:  30 tablet    Refill:  5    This rx should replace the last one written in April 2017  . butorphanol (STADOL) 10 MG/ML nasal spray    Sig: Place 1 spray into the nose every 4 (four) hours as needed for migraine. May refill weekly. To follow last prescription of 02/29/16. Awaiting transfer to Pain Management program at [facility]    Dispense:  2.5 mL    Refill:  5   Main phone now is the cell  She has been accepted for pain management but we need to know appt date DR.  [facility]    [place], [place]   [phone]     F/u Dr Doy Hutching as planned in October/sooner if needed

## 2016-04-02 NOTE — Patient Instructions (Addendum)
     IF you received an x-ray today, you will receive an invoice from [facility]. Please contact [facility] at [phone] with questions or concerns regarding your invoice.   IF you received labwork today, you will receive an invoice from [employer]. Please contact [facility] at [phone] with questions or concerns regarding your invoice.   Our billing staff will not be able to assist you with questions regarding bills from these companies.  You will be contacted with the lab results as soon as they are available. The fastest way to get your results is to activate your My Chart account. Instructions are located on the last page of this paperwork. If you have not heard from [place] regarding the results in 2 weeks, please contact this office.    Continue with Dr Doy Hutching or consider: Dr Tommi Rumps or partner Contact here could be Philis Fendt PA-C for other medical problems  DR. [facility]    [place], [place]    [phone]

## 2016-04-10 ENCOUNTER — Encounter (HOSPITAL_COMMUNITY): Payer: Self-pay | Admitting: Emergency Medicine

## 2016-04-10 ENCOUNTER — Inpatient Hospital Stay (HOSPITAL_COMMUNITY)
Admission: EM | Admit: 2016-04-10 | Discharge: 2016-04-14 | DRG: 872 | Disposition: A | Discharge status: Home / Self Care | Payer: Medicare Other | Attending: Internal Medicine | Admitting: Internal Medicine

## 2016-04-10 ENCOUNTER — Inpatient Hospital Stay (HOSPITAL_COMMUNITY): Payer: Medicare Other

## 2016-04-10 DIAGNOSIS — N12 Tubulo-interstitial nephritis, not specified as acute or chronic: Secondary | ICD-10-CM | POA: Diagnosis present

## 2016-04-10 DIAGNOSIS — N2889 Other specified disorders of kidney and ureter: Secondary | ICD-10-CM | POA: Diagnosis not present

## 2016-04-10 DIAGNOSIS — Z7984 Long term (current) use of oral hypoglycemic drugs: Secondary | ICD-10-CM | POA: Diagnosis not present

## 2016-04-10 DIAGNOSIS — R6520 Severe sepsis without septic shock: Secondary | ICD-10-CM | POA: Diagnosis present

## 2016-04-10 DIAGNOSIS — Z87442 Personal history of urinary calculi: Secondary | ICD-10-CM

## 2016-04-10 DIAGNOSIS — A419 Sepsis, unspecified organism: Secondary | ICD-10-CM | POA: Diagnosis not present

## 2016-04-10 DIAGNOSIS — N136 Pyonephrosis: Secondary | ICD-10-CM | POA: Diagnosis present

## 2016-04-10 DIAGNOSIS — R109 Unspecified abdominal pain: Secondary | ICD-10-CM | POA: Diagnosis not present

## 2016-04-10 DIAGNOSIS — R0902 Hypoxemia: Secondary | ICD-10-CM | POA: Diagnosis not present

## 2016-04-10 DIAGNOSIS — I10 Essential (primary) hypertension: Secondary | ICD-10-CM | POA: Diagnosis present

## 2016-04-10 DIAGNOSIS — E039 Hypothyroidism, unspecified: Secondary | ICD-10-CM | POA: Diagnosis present

## 2016-04-10 DIAGNOSIS — Z888 Allergy status to other drugs, medicaments and biological substances status: Secondary | ICD-10-CM

## 2016-04-10 DIAGNOSIS — E8779 Other fluid overload: Secondary | ICD-10-CM | POA: Diagnosis not present

## 2016-04-10 DIAGNOSIS — G43909 Migraine, unspecified, not intractable, without status migrainosus: Secondary | ICD-10-CM | POA: Diagnosis present

## 2016-04-10 DIAGNOSIS — E669 Obesity, unspecified: Secondary | ICD-10-CM | POA: Diagnosis present

## 2016-04-10 DIAGNOSIS — N179 Acute kidney failure, unspecified: Secondary | ICD-10-CM | POA: Diagnosis present

## 2016-04-10 DIAGNOSIS — E876 Hypokalemia: Secondary | ICD-10-CM | POA: Diagnosis present

## 2016-04-10 DIAGNOSIS — R509 Fever, unspecified: Secondary | ICD-10-CM | POA: Diagnosis not present

## 2016-04-10 DIAGNOSIS — G40909 Epilepsy, unspecified, not intractable, without status epilepticus: Secondary | ICD-10-CM | POA: Diagnosis present

## 2016-04-10 DIAGNOSIS — E1129 Type 2 diabetes mellitus with other diabetic kidney complication: Secondary | ICD-10-CM | POA: Diagnosis not present

## 2016-04-10 DIAGNOSIS — D649 Anemia, unspecified: Secondary | ICD-10-CM | POA: Diagnosis present

## 2016-04-10 DIAGNOSIS — N390 Urinary tract infection, site not specified: Secondary | ICD-10-CM

## 2016-04-10 DIAGNOSIS — Z87440 Personal history of urinary (tract) infections: Secondary | ICD-10-CM | POA: Diagnosis not present

## 2016-04-10 DIAGNOSIS — N10 Acute tubulo-interstitial nephritis: Secondary | ICD-10-CM | POA: Diagnosis not present

## 2016-04-10 DIAGNOSIS — R6510 Systemic inflammatory response syndrome (SIRS) of non-infectious origin without acute organ dysfunction: Secondary | ICD-10-CM | POA: Diagnosis present

## 2016-04-10 DIAGNOSIS — Z6838 Body mass index (BMI) 38.0-38.9, adult: Secondary | ICD-10-CM

## 2016-04-10 DIAGNOSIS — N132 Hydronephrosis with renal and ureteral calculous obstruction: Secondary | ICD-10-CM | POA: Diagnosis not present

## 2016-04-10 DIAGNOSIS — E785 Hyperlipidemia, unspecified: Secondary | ICD-10-CM | POA: Diagnosis present

## 2016-04-10 DIAGNOSIS — E038 Other specified hypothyroidism: Secondary | ICD-10-CM | POA: Diagnosis not present

## 2016-04-10 LAB — COMPREHENSIVE METABOLIC PANEL
ALT: 13 U/L — AB (ref 14–54)
AST: 19 U/L (ref 15–41)
Albumin: 3.7 g/dL (ref 3.5–5.0)
Alkaline Phosphatase: 61 U/L (ref 38–126)
Anion gap: 14 (ref 5–15)
BUN: 19 mg/dL (ref 6–20)
CHLORIDE: 102 mmol/L (ref 101–111)
CO2: 19 mmol/L — ABNORMAL LOW (ref 22–32)
CREATININE: 1.4 mg/dL — AB (ref 0.44–1.00)
Calcium: 9.8 mg/dL (ref 8.9–10.3)
GFR calc Af Amer: 44 mL/min — ABNORMAL LOW (ref >60)
GFR, EST NON AFRICAN AMERICAN: 38 mL/min — AB (ref >60)
GLUCOSE: 245 mg/dL — AB (ref 65–99)
Potassium: 4.7 mmol/L (ref 3.5–5.1)
Sodium: 135 mmol/L (ref 135–145)
Total Bilirubin: 0.5 mg/dL (ref 0.3–1.2)
Total Protein: 7.5 g/dL (ref 6.5–8.1)

## 2016-04-10 LAB — PROTIME-INR
INR: 1.28 (ref 0.00–1.49)
Prothrombin Time: 16.1 seconds — ABNORMAL HIGH (ref 11.6–15.2)

## 2016-04-10 LAB — CBC WITH DIFFERENTIAL/PLATELET
BASOS PCT: 0 %
Basophils Absolute: 0 10*3/uL (ref 0.0–0.1)
Eosinophils Absolute: 0 10*3/uL (ref 0.0–0.7)
Eosinophils Relative: 0 %
HEMATOCRIT: 32.9 % — AB (ref 36.0–46.0)
HEMOGLOBIN: 9.8 g/dL — AB (ref 12.0–15.0)
LYMPHS ABS: 1.2 10*3/uL (ref 0.7–4.0)
Lymphocytes Relative: 7 %
MCH: 25.9 pg — AB (ref 26.0–34.0)
MCHC: 29.8 g/dL — AB (ref 30.0–36.0)
MCV: 87 fL (ref 78.0–100.0)
MONO ABS: 2.2 10*3/uL — AB (ref 0.1–1.0)
MONOS PCT: 12 %
NEUTROS ABS: 14.2 10*3/uL — AB (ref 1.7–7.7)
Neutrophils Relative %: 81 %
Platelets: 294 10*3/uL (ref 150–400)
RBC: 3.78 MIL/uL — ABNORMAL LOW (ref 3.87–5.11)
RDW: 15.6 % — AB (ref 11.5–15.5)
WBC: 17.6 10*3/uL — ABNORMAL HIGH (ref 4.0–10.5)

## 2016-04-10 LAB — URINALYSIS, ROUTINE W REFLEX MICROSCOPIC
Bilirubin Urine: NEGATIVE
Glucose, UA: NEGATIVE mg/dL
Ketones, ur: NEGATIVE mg/dL
Nitrite: POSITIVE — AB
PROTEIN: 100 mg/dL — AB
Specific Gravity, Urine: 1.02 (ref 1.005–1.030)
pH: 6 (ref 5.0–8.0)

## 2016-04-10 LAB — URINE MICROSCOPIC-ADD ON

## 2016-04-10 LAB — GLUCOSE, CAPILLARY: GLUCOSE-CAPILLARY: 231 mg/dL — AB (ref 65–99)

## 2016-04-10 LAB — APTT: APTT: 34 s (ref 24–37)

## 2016-04-10 LAB — I-STAT CG4 LACTIC ACID, ED: Lactic Acid, Venous: 5.09 mmol/L (ref 0.5–1.9)

## 2016-04-10 LAB — LACTIC ACID, PLASMA: Lactic Acid, Venous: 2.5 mmol/L (ref 0.5–1.9)

## 2016-04-10 MED ORDER — DEXTROSE 5 % IV SOLN
1.0000 g | INTRAVENOUS | Status: DC
  Administered 2016-04-11 – 2016-04-12 (×2): 1 g via INTRAVENOUS
  Filled 2016-04-10 (×3): qty 10

## 2016-04-10 MED ORDER — SODIUM CHLORIDE 0.9 % IV BOLUS (SEPSIS)
1000.0000 mL | Freq: Once | INTRAVENOUS | Status: AC
  Administered 2016-04-10: 1000 mL via INTRAVENOUS

## 2016-04-10 MED ORDER — CLOBETASOL PROPIONATE 0.05 % EX CREA
TOPICAL_CREAM | Freq: Two times a day (BID) | CUTANEOUS | Status: DC
  Administered 2016-04-10 – 2016-04-13 (×4): via TOPICAL
  Filled 2016-04-10: qty 15

## 2016-04-10 MED ORDER — DEXTROSE 5 % IV SOLN
1.0000 g | Freq: Once | INTRAVENOUS | Status: AC
  Administered 2016-04-10: 1 g via INTRAVENOUS
  Filled 2016-04-10: qty 10

## 2016-04-10 MED ORDER — BUTORPHANOL TARTRATE 10 MG/ML NA SOLN: 1.0000 | NASAL | Status: DC | PRN

## 2016-04-10 MED ORDER — IBUPROFEN 400 MG PO TABS
400.0000 mg | ORAL_TABLET | Freq: Four times a day (QID) | ORAL | Status: DC | PRN
  Administered 2016-04-11: 400 mg via ORAL
  Filled 2016-04-10: qty 1

## 2016-04-10 MED ORDER — ATORVASTATIN CALCIUM 20 MG PO TABS: 20.0000 mg | ORAL_TABLET | Freq: Every day | ORAL | Status: DC

## 2016-04-10 MED ORDER — LEVOTHYROXINE SODIUM 75 MCG PO TABS
75.0000 ug | ORAL_TABLET | Freq: Every day | ORAL | Status: DC
  Administered 2016-04-11 – 2016-04-14 (×4): 75 ug via ORAL
  Filled 2016-04-10 (×4): qty 1

## 2016-04-10 MED ORDER — SODIUM CHLORIDE 0.9 % IV BOLUS (SEPSIS): 1000.0000 mL | Freq: Once | INTRAVENOUS | Status: DC

## 2016-04-10 MED ORDER — INSULIN ASPART 100 UNIT/ML ~~LOC~~ SOLN
0.0000 [IU] | Freq: Every day | SUBCUTANEOUS | Status: DC
  Administered 2016-04-10 – 2016-04-13 (×3): 2 [IU] via SUBCUTANEOUS

## 2016-04-10 MED ORDER — FLUOROMETHOLONE 0.1 % OP SUSP
1.0000 [drp] | Freq: Two times a day (BID) | OPHTHALMIC | Status: DC
  Administered 2016-04-10 – 2016-04-14 (×6): 1 [drp] via OPHTHALMIC
  Filled 2016-04-10: qty 5

## 2016-04-10 MED ORDER — MORPHINE SULFATE (PF) 4 MG/ML IV SOLN
4.0000 mg | Freq: Once | INTRAVENOUS | Status: AC
  Administered 2016-04-10: 4 mg via INTRAVENOUS
  Filled 2016-04-10: qty 1

## 2016-04-10 MED ORDER — ACETAMINOPHEN 500 MG PO TABS
1000.0000 mg | ORAL_TABLET | Freq: Once | ORAL | Status: AC
  Administered 2016-04-10: 1000 mg via ORAL
  Filled 2016-04-10: qty 2

## 2016-04-10 MED ORDER — DEXTROSE 5 % IV SOLN
1.0000 g | INTRAVENOUS | Status: DC
  Filled 2016-04-10: qty 10

## 2016-04-10 MED ORDER — SODIUM CHLORIDE 0.9 % IV BOLUS (SEPSIS)
500.0000 mL | Freq: Once | INTRAVENOUS | Status: AC
  Administered 2016-04-10: 500 mL via INTRAVENOUS

## 2016-04-10 MED ORDER — SODIUM CHLORIDE 0.9 % IV SOLN
INTRAVENOUS | Status: DC
  Administered 2016-04-10 – 2016-04-12 (×3): via INTRAVENOUS

## 2016-04-10 MED ORDER — INSULIN ASPART 100 UNIT/ML ~~LOC~~ SOLN
0.0000 [IU] | Freq: Three times a day (TID) | SUBCUTANEOUS | Status: DC
  Administered 2016-04-11: 3 [IU] via SUBCUTANEOUS
  Administered 2016-04-11: 1 [IU] via SUBCUTANEOUS
  Administered 2016-04-11 – 2016-04-12 (×2): 2 [IU] via SUBCUTANEOUS
  Administered 2016-04-12: 1 [IU] via SUBCUTANEOUS
  Administered 2016-04-13 – 2016-04-14 (×4): 2 [IU] via SUBCUTANEOUS

## 2016-04-10 MED ORDER — ACETAMINOPHEN 325 MG PO TABS
650.0000 mg | ORAL_TABLET | ORAL | Status: DC | PRN
  Administered 2016-04-11 – 2016-04-13 (×5): 650 mg via ORAL
  Filled 2016-04-10 (×5): qty 2

## 2016-04-10 MED ORDER — ATORVASTATIN CALCIUM 20 MG PO TABS
20.0000 mg | ORAL_TABLET | Freq: Every day | ORAL | Status: DC
  Administered 2016-04-11 – 2016-04-13 (×3): 20 mg via ORAL
  Filled 2016-04-10 (×3): qty 1

## 2016-04-10 MED ORDER — SODIUM CHLORIDE 0.9 % IV BOLUS (SEPSIS)
250.0000 mL | Freq: Once | INTRAVENOUS | Status: AC
  Administered 2016-04-10: 250 mL via INTRAVENOUS

## 2016-04-10 NOTE — ED Provider Notes (Signed)
CSN: [id number]     Arrival date & time 04/10/16  1057 History   First MD Initiated Contact with Patient 04/10/16 1525     Chief Complaint  Patient presents with  . Fever  . Back Pain     (Consider location/radiation/quality/duration/timing/severity/associated sxs/prior Treatment) HPI  Natalie [name] is a 65 y.o F with a pmhc of nephrolithiasis, recurrent UTI, DM, HTN, HLD who presents tot he ED today c/o fever and back pain. Patient states that last night around 5 PM she felt sudden onset right-sided back/flank pain and dysuria. Patient subsequently developed a fever that rose to approximately 102. Patient's family was with her states she began to become increasingly confused, unsure what time of day it was or day of the week. Patient's daughter states this morning when she woke up she was having continued confusion and back pain. Patient states this typically happens when she gets UTIs and she states her pain today feels similar to her recurrent UTIs. She denies any hematuria, vaginal discharge, abdominal pain, vomiting.    Past Medical History  Diagnosis Date  . Hyperlipidemia   . Hypertension   . Diabetes mellitus without complication [facility])    Past Surgical History  Procedure Laterality Date  . Cholecystectomy    . Cesarean section     Family History  Problem Relation Age of Onset  . Heart disease Father   . Diabetes Father    Social History  Substance Use Topics  . Smoking status: Never Smoker   . Smokeless tobacco: Never Used  . Alcohol Use: No   OB History    No data available     Review of Systems  All other systems reviewed and are negative.     Allergies  Compazine; Cymbalta; Escitalopram oxalate; Methadone; and Nitrofuran derivatives  Home Medications   Prior to Admission medications   Medication Sig Start Date End Date Taking? Authorizing Provider  atorvastatin (LIPITOR) 20 MG tablet Take 1 tablet (20 mg total) by mouth daily. 01/04/16   Leandrew Koyanagi, MD  butorphanol (STADOL) 10 MG/ML nasal spray Place 1 spray into the nose every 4 (four) hours as needed for migraine. May refill weekly. To follow last prescription of 02/29/16. Awaiting transfer to [facility] Management program at [facility]. 04/02/16   Leandrew Koyanagi, MD  clobetasol cream (TEMOVATE) 0.05 % Apply topically 2 (two) times daily. 01/21/16   Leandrew Koyanagi, MD  ESTRACE VAGINAL 0.1 MG/GM vaginal cream insert 1/4 gram vaginally two times a week 12/03/15   Historical Provider, MD  fluorometholone (FML) 0.1 % ophthalmic suspension USE 1 DROP INTO BOTH EYES 4 TIMES A DAY FOR 7 DAYS 03/28/16   Historical Provider, MD  levothyroxine (SYNTHROID, LEVOTHROID) 75 MCG tablet Take 1 tablet (75 mcg total) by mouth daily. 01/04/16   Leandrew Koyanagi, MD  losartan (COZAAR) 50 MG tablet Take 1 tablet (50 mg total) by mouth daily. 01/04/16   Leandrew Koyanagi, MD  metFORMIN (GLUCOPHAGE) 1000 MG tablet Take 1 tablet (1,000 mg total) by mouth 2 (two) times daily with a meal. 01/04/16   Leandrew Koyanagi, MD  ondansetron (ZOFRAN-ODT) 8 MG disintegrating tablet DISSOLVE 1 TABLET ON OR UNDER THE TONGUE EVERY 8 HOURS AS NEEDED FOR NAUSEA 12/29/15   Leandrew Koyanagi, MD  PHENobarbital (LUMINAL) 97.2 MG tablet TAKE TWO TABLETS BY MOUTH ONCE DAILY ON  MON,  WED,  AND  FRI  AND  1  TABLET  DAILY  ON  OTHER  DAYS  01/04/16   Leandrew Koyanagi, MD  terconazole (TERAZOL 3) 0.8 % vaginal cream Reported on 01/20/2016 12/05/15   Historical Provider, MD  zolpidem (AMBIEN) 10 MG tablet take 1 tablet by mouth at bedtime for sleep if needed 04/02/16   Leandrew Koyanagi, MD   BP 139/66 mmHg  Pulse 113  Temp(Src) 98.8 F (37.1 C) (Oral)  Resp 14  SpO2 95% Physical Exam  Constitutional: She is oriented to person, place, and time. She appears well-developed and well-nourished. No distress.  HENT:  Head: Normocephalic and atraumatic.  Mouth/Throat: No oropharyngeal exudate.  Eyes: Conjunctivae and EOM are normal. Pupils are  equal, round, and reactive to light. Right eye exhibits no discharge. Left eye exhibits no discharge. No scleral icterus.  Cardiovascular: Normal rate, regular rhythm, normal heart sounds and intact distal pulses.  Exam reveals no gallop and no friction rub.   No murmur heard. Pulmonary/Chest: Effort normal and breath sounds normal. No respiratory distress. She has no wheezes. She has no rales. She exhibits no tenderness.  Abdominal: Soft. She exhibits no distension. There is no tenderness. There is no guarding.  CVA tenderness on R flank  Musculoskeletal: Normal range of motion. She exhibits no edema.  Neurological: She is alert and oriented to person, place, and time.  Skin: Skin is warm and dry. No rash noted. She is not diaphoretic. No erythema. No pallor.  Psychiatric: She has a normal mood and affect. Her behavior is normal.  Nursing note and vitals reviewed.   ED Course  Procedures (including critical care time)  CRITICAL CARE Performed by: Carlos Levering   Total critical care time: 45 minutes  Critical care time was exclusive of separately billable procedures and treating other patients.  Critical care was necessary to treat or prevent imminent or life-threatening deterioration.  Critical care was time spent personally by me on the following activities: development of treatment plan with patient and/or surrogate as well as nursing, discussions with consultants, evaluation of patient's response to treatment, examination of patient, obtaining history from patient or surrogate, ordering and performing treatments and interventions, ordering and review of laboratory studies, ordering and review of radiographic studies, pulse oximetry and re-evaluation of patient's condition.  Labs Review Labs Reviewed  URINALYSIS, ROUTINE W REFLEX MICROSCOPIC (NOT AT [facility]) - Abnormal; Notable for the following:    APPearance TURBID (*)    Hgb urine dipstick LARGE (*)    Protein, ur 100 (*)     Nitrite POSITIVE (*)    Leukocytes, UA LARGE (*)    All other components within normal limits  URINE MICROSCOPIC-ADD ON - Abnormal; Notable for the following:    Squamous Epithelial / LPF 0-5 (*)    Bacteria, UA FEW (*)    All other components within normal limits  CBC WITH DIFFERENTIAL/PLATELET - Abnormal; Notable for the following:    WBC 17.6 (*)    RBC 3.78 (*)    Hemoglobin 9.8 (*)    HCT 32.9 (*)    MCH 25.9 (*)    MCHC 29.8 (*)    RDW 15.6 (*)    Neutro Abs 14.2 (*)    Monocytes Absolute 2.2 (*)    All other components within normal limits  COMPREHENSIVE METABOLIC PANEL - Abnormal; Notable for the following:    CO2 19 (*)    Glucose, Bld 245 (*)    Creatinine, Ser 1.40 (*)    ALT 13 (*)    GFR calc non Af Amer 38 (*)  GFR calc Af Amer 44 (*)    All other components within normal limits  URINE CULTURE    Imaging Review No results found. I have personally reviewed and evaluated these images and lab results as part of my medical decision-making.   EKG Interpretation None      MDM   Final diagnoses:  Sepsis, due to unspecified organism [facility])  Pyelonephritis    65 year old female presents to the ED today complaining of sudden onset right-sided flank pain, fever and dysuria onset last night. Patient has recurrent UTIs and states this feels similar. Patient's family states that patient was confused and having altered mental status last night. She is alert and oriented in the ED today. She is currently afebrile but has a heart rate elevated to 113. Her urine appears grossly infected. Her white blood cells came back elevated at 17.6. Patient now has 2 positive SIRS criteria with a known source of infection. Code sepsis was called. Patient's lactate has not resulted yet. She is not hypotensive. Patient was given 2 g IV ceftriaxone and fluids based on the sepsis order set. I spoke with Dr. Jeoffrey Massed with the [facility] hospitalist who will admit patient for sepsis secondary to  pyelonephritis. Patient is currently hemodynamically stable and awaiting bed placement.   Patient was discussed with and seen by Dr. Tomi Bamberger who agrees with the treatment plan.      Dondra Spry [place], PA-C 04/10/16 1939  Dorie Rank, MD 04/11/16 [phone]

## 2016-04-10 NOTE — H&P (Signed)
History and Physical    Natalie [name] [id number] DOB: October 21, 1950 DOA: 04/10/2016  PCP: Natalie Koyanagi, MD Patient coming from: Home.   Chief Complaint: fever chills and back pain.   HPI: Natalie [name] is a 65 y.o. female with medical history significant of hypertension, DM, migraines, seizure disorder, hyperlipidemia,  Recurrent UTI's, renal stones, was seen normal 2 says ago, but since yesterday, pt has been confused, had fever , back pain, nausea. Most of the history obtained from ED note and patient. Patient her self is a poor historian , though she is alert and oriented to place and person, not oriented to time. Pt reports dizziness,and most ly nausea, she denies any falls. Her back pain , is on the right, non radiating, associated with nausea and fever and chills. She denies any chest pain or sob. She denies any syncopal episodes. She is a patient of family medicine but was referred to hospitalist service for admission for sepsis from pyelonephritis.   ED Course: on arrival to ED, she was found to be febrile, tachycardic, and her labs revealed elevated WBC count, infected urine, creatinine of 1.4 and elevated lactic acid and hemoglobin of 9.8. Sepsis protocol initiated she received fluid boluses, and is on maintenance fluids.   Review of Systems: As per HPI otherwise 10 point review of systems negative.    Past Medical History  Diagnosis Date  . Hyperlipidemia   . Hypertension   . Diabetes mellitus without complication [facility])     Past Surgical History  Procedure Laterality Date  . Cholecystectomy    . Cesarean section       reports that she has never smoked. She has never used smokeless tobacco. She reports that she does not drink alcohol. Her drug history is not on file.  Allergies  Allergen Reactions  . Compazine     Bad headache, vomiting   . Cymbalta [Duloxetine Hcl]     unknown  . Escitalopram Oxalate Other (See Comments)    Can't sleep  . Methadone Hives  .  Nitrofuran Derivatives     unknown    Family History  Problem Relation Age of Onset  . Heart disease Father   . Diabetes Father    Family history reviewed, and since she is confused, she could not provide memore family history.   Prior to Admission medications   Medication Sig Start Date End Date Taking? Authorizing Provider  atorvastatin (LIPITOR) 20 MG tablet Take 1 tablet (20 mg total) by mouth daily. 01/04/16  Yes Natalie Koyanagi, MD  butorphanol (STADOL) 10 MG/ML nasal spray Place 1 spray into the nose every 4 (four) hours as needed for migraine. May refill weekly. To follow last prescription of 02/29/16. Awaiting transfer to Pain Management program at [facility]. 04/02/16  Yes Natalie Koyanagi, MD  clobetasol cream (TEMOVATE) 0.05 % Apply topically 2 (two) times daily. 01/21/16  Yes Natalie Koyanagi, MD  ESTRACE VAGINAL 0.1 MG/GM vaginal cream insert 1/4 gram vaginally two times a week 12/03/15  Yes Historical Provider, MD  fluorometholone (FML) 0.1 % ophthalmic suspension USE 1 DROP INTO BOTH EYES 4 TIMES A DAY FOR 7 DAYS 03/28/16  Yes Historical Provider, MD  levothyroxine (SYNTHROID, LEVOTHROID) 75 MCG tablet Take 1 tablet (75 mcg total) by mouth daily. 01/04/16  Yes Natalie Koyanagi, MD  losartan (COZAAR) 50 MG tablet Take 1 tablet (50 mg total) by mouth daily. 01/04/16  Yes Natalie Koyanagi, MD  metFORMIN (GLUCOPHAGE) 1000 MG tablet Take 1  tablet (1,000 mg total) by mouth 2 (two) times daily with a meal. 01/04/16  Yes Natalie Koyanagi, MD  ondansetron (ZOFRAN-ODT) 8 MG disintegrating tablet DISSOLVE 1 TABLET ON OR UNDER THE TONGUE EVERY 8 HOURS AS NEEDED FOR NAUSEA 12/29/15  Yes Natalie Koyanagi, MD  PHENobarbital (LUMINAL) 97.2 MG tablet TAKE TWO TABLETS BY MOUTH ONCE DAILY ON  MON,  WED,  AND  FRI  AND  1  TABLET  DAILY  ON  OTHER  DAYS 01/04/16  Yes Natalie Koyanagi, MD  terconazole (TERAZOL 3) 0.8 % vaginal cream Place 1 applicator vaginally at bedtime.  12/05/15  Yes Historical  Provider, MD  zolpidem (AMBIEN) 10 MG tablet take 1 tablet by mouth at bedtime for sleep if needed 04/02/16  Yes Natalie Koyanagi, MD    Physical Exam: Filed Vitals:   04/10/16 1600 04/10/16 1615 04/10/16 1745 04/10/16 1859  BP: 150/69 139/66 156/75 163/72  Pulse: 115 113 117 114  Temp:    103 F (39.4 C)  TempSrc:    Oral  Resp:    18  SpO2: 91% 95% 98% 96%      Constitutional: NAD, calm, comfortable Filed Vitals:   04/10/16 1600 04/10/16 1615 04/10/16 1745 04/10/16 1859  BP: 150/69 139/66 156/75 163/72  Pulse: 115 113 117 114  Temp:    103 F (39.4 C)  TempSrc:    Oral  Resp:    18  SpO2: 91% 95% 98% 96%   Eyes: PERRL, lids and conjunctivae normal ENMT: Mucous membranes are dry. Posterior pharynx clear of any exudate or lesions.Normal dentition.  Neck: normal, supple, no masses, no thyromegaly Respiratory: clear to auscultation bilaterally, no wheezing, no crackles. Normal respiratory effort. No accessory muscle use.  Cardiovascular: Regular rate and rhythm, . No extremity edema. 2+ pedal pulses. No carotid bruits.  Abdomen: mild lower abd tenderness, and right flank pain,  Bowel sounds positive.  Musculoskeletal: no clubbing / cyanosis. No joint deformity upper and lower extremities. Good ROM, no contractures. Normal muscle tone.  Skin: no rashes, lesions, ulcers. No induration Neurologic: alert and oriented to place and person, not to time. Walked from the bathroom to the bed with 2 people assist.    Labs on Admission: I have personally reviewed following labs and imaging studies  CBC:  Recent Labs Lab 04/10/16 1530  WBC 17.6*  NEUTROABS 14.2*  HGB 9.8*  HCT 32.9*  MCV 87.0  PLT [id number]   Basic Metabolic Panel:  Recent Labs Lab 04/10/16 1530  NA 135  K 4.7  CL 102  CO2 19*  GLUCOSE 245*  BUN 19  CREATININE 1.40*  CALCIUM 9.8   GFR: Estimated Creatinine Clearance: 31.7 mL/min (by C-G formula based on Cr of 1.4). Liver Function Tests:  Recent  Labs Lab 04/10/16 1530  AST 19  ALT 13*  ALKPHOS 61  BILITOT 0.5  PROT 7.5  ALBUMIN 3.7   No results for input(s): LIPASE, AMYLASE in the last 168 hours. No results for input(s): AMMONIA in the last 168 hours. Coagulation Profile: No results for input(s): INR, PROTIME in the last 168 hours. Cardiac Enzymes: No results for input(s): CKTOTAL, CKMB, CKMBINDEX, TROPONINI in the last 168 hours. BNP (last 3 results) No results for input(s): PROBNP in the last 8760 hours. HbA1C: No results for input(s): HGBA1C in the last 72 hours. CBG: No results for input(s): GLUCAP in the last 168 hours. Lipid Profile: No results for input(s): CHOL, HDL, LDLCALC, TRIG, CHOLHDL, LDLDIRECT in  the last 72 hours. Thyroid Function Tests: No results for input(s): TSH, T4TOTAL, FREET4, T3FREE, THYROIDAB in the last 72 hours. Anemia Panel: No results for input(s): VITAMINB12, FOLATE, FERRITIN, TIBC, IRON, RETICCTPCT in the last 72 hours. Urine analysis:    Component Value Date/Time   COLORURINE YELLOW 04/10/2016 1116   APPEARANCEUR TURBID* 04/10/2016 1116   LABSPEC 1.020 04/10/2016 1116   PHURINE 6.0 04/10/2016 1116   GLUCOSEU NEGATIVE 04/10/2016 1116   HGBUR LARGE* 04/10/2016 1116   BILIRUBINUR NEGATIVE 04/10/2016 1116   BILIRUBINUR neg 11/30/2014 1834   KETONESUR NEGATIVE 04/10/2016 1116   PROTEINUR 100* 04/10/2016 1116   PROTEINUR >=300 11/30/2014 1834   UROBILINOGEN 0.2 11/30/2014 1834   UROBILINOGEN 0.2 08/31/2014 2056   NITRITE POSITIVE* 04/10/2016 1116   NITRITE neg 11/30/2014 1834   LEUKOCYTESUR LARGE* 04/10/2016 1116   Sepsis Labs: !!!!!!!!!!!!!!!!!!!!!!!!!!!!!!!!!!!!!!!!!!!! @LABRCNTIP (procalcitonin:4,lacticidven:4) )No results found for this or any previous visit (from the past 240 hour(s)).   Radiological Exams on Admission: Dg Chest Port 1 View  04/10/2016  CLINICAL DATA:  Fevers for 1 day, initial encounter EXAM: PORTABLE CHEST 1 VIEW COMPARISON:  09/06/2014 FINDINGS: Cardiac  shadow is mildly prominent accentuated by the portable technique. Patient rotation to the right is noted accentuating the mediastinal markings. The overall inspiratory effort is poor with crowding of the vascular markings although no focal confluent infiltrate is seen. IMPRESSION: Poor inspiratory effort.  No definitive infiltrate is noted. Electronically Signed   By: Inez Catalina M.D.   On: 04/10/2016 19:39    EKG:not done on admission.   Assessment/Plan Active Problems:   HTN (hypertension)   Hyperlipemia   Hypothyroidism   Seizure disorder (HCC)   Migraine   Recurrent UTI   Pyelonephritis   Type 2 diabetes mellitus with other diabetic kidney complication ([place])   Sepsis from PYelonephritis: Admitted to telemetry as she is tachycardic and for closer monitoring.  Fluid boluses given and on maintenance fluids. Trend lactic acid levels.  Tylenol for fever, blood cultures and urine cultures done and pending.  IV rocephin on board.  As she continues to have persistent fevers will get CT stone study.    Acute kidney injury: Probably from sepsis,  Get cT , evaluate for obstruction/ renal stones/ hydronephrosis.  Hydrate and repeat renal parameters in am.  Get urine electrolytes.    Hypertension: Monitor.    Diabetes Mellitus: CBG (last 3)  No results for input(s): GLUCAP in the last 72 hours.  Resume SSI.  hgba1c will be ordered for am labs.    Hyperlipidemia: Resume home meds.    Migraine: Denies any at this time.   Seizures: None at this time.  Resume home meds.  Will be on seizure precautions.   Normocytic anemia: Monitor.    DVT prophylaxis: lovenox. Code Status: presumed full. Pt not in a position to comprehend the conversation. No family at bedside. When asked about her husband, she reported he is not at home and is always out of town. Daughter will be in coming in an hour.  Family Communication: none atbedside.  Disposition Plan: pending further  management, will need PT eval.  Consults called: none Admission status: inpatient Nanda Quinton MD [facility] Pager [phone]  If 7PM-7AM, please contact night-coverage [email or web address] Password [email or web address]  04/10/2016, 8:25 PM

## 2016-04-10 NOTE — ED Notes (Signed)
Patient here with back pain, some confusion and fever since last pm, family thinks its a recurrent UTI, NAD. Alert to person and place.

## 2016-04-10 NOTE — Progress Notes (Signed)
CRITICAL VALUE ALERT  Critical value received:  Lactic 2.5  Date of notification:  04/10/16  Time of notification:  2105  Critical value read back: Yes  Nurse who received alert:  Dacota Ruben B. Rulon Eisenmenger, RN  MD notified (1st page):  Tylene Fantasia (Mid-level)  Time of first page:  2125  MD notified (2nd page):  Time of second page:  Responding MD:  Tylene Fantasia (Mid-level)  Time MD responded:  2137

## 2016-04-10 NOTE — Progress Notes (Signed)
CRITICAL VALUE ALERT  Critical value received:  Lactic 5.09  Date of notification:  04/10/2016  Time of notification:  1915  Critical value read back: Review of labs/orders upon patient's arrival to floor  Nurse who received alert:  Jerol Rufener B. Rulon Eisenmenger, RN  MD notified (1st page):  Tylene Fantasia (Mid-level)  Time of first page:  1925  MD notified (2nd page):  Time of second page:  Responding MD:  Tylene Fantasia  Time MD responded:  [phone]

## 2016-04-10 NOTE — ED Provider Notes (Signed)
Patient presented to the emergency room with complaints of fever or back pain and symptoms suggestive of a recurrent urinary tract infection. She states she has a history of urinary tract infections as well as kidney stones. She doesn't feel like she has a kidney stone at this point however her pain has been increasing. Physical Exam  BP 139/66 mmHg  Pulse 113  Temp(Src) 98.8 F (37.1 C) (Oral)  Resp 14  SpO2 95%  Physical Exam  HENT:  Head: Normocephalic and atraumatic.  Right Ear: External ear normal.  Left Ear: External ear normal.  Eyes: Conjunctivae are normal. Right eye exhibits no discharge. Left eye exhibits no discharge. No scleral icterus.  Neck: Neck supple. No tracheal deviation present.  Cardiovascular: Normal rate.   Pulmonary/Chest: Effort normal. No stridor. No respiratory distress.  Abdominal: She exhibits no mass. There is CVA tenderness (right sided).  Musculoskeletal: She exhibits no edema.  Neurological: She is alert. Cranial nerve deficit: no gross deficits.  Skin: Skin is warm and dry. No rash noted. She is not diaphoretic.  Psychiatric: She has a normal mood and affect.  Nursing note and vitals reviewed.   ED Course  Procedures  MDM Patient has a urinary tract infection on urinalysis. She also has a significant leukocytosis and an elevated lactic acid level.  Currently she is not hypotensive and her lactic acid level is not greater than 4 however I'm concerned about the possibility of developing sepsis. Patient has been started on IV Rocephin. 30 mL/kg bolus ordered.   Consider the possibility infected stone. We will continue to monitor closely. If patient continues to have pain and does not respond to IV pain medications we will consider CT abdomen pelvis to evaluate for ureterolithiasis  Plan admission to the hospital for further treatment.      Dorie Rank, MD 04/10/16 [phone]

## 2016-04-11 ENCOUNTER — Inpatient Hospital Stay (HOSPITAL_COMMUNITY): Payer: Medicare Other

## 2016-04-11 DIAGNOSIS — E038 Other specified hypothyroidism: Secondary | ICD-10-CM

## 2016-04-11 DIAGNOSIS — A419 Sepsis, unspecified organism: Principal | ICD-10-CM

## 2016-04-11 DIAGNOSIS — G40909 Epilepsy, unspecified, not intractable, without status epilepticus: Secondary | ICD-10-CM

## 2016-04-11 DIAGNOSIS — N179 Acute kidney failure, unspecified: Secondary | ICD-10-CM

## 2016-04-11 LAB — BASIC METABOLIC PANEL
ANION GAP: 13 (ref 5–15)
BUN: 14 mg/dL (ref 6–20)
CALCIUM: 8.6 mg/dL — AB (ref 8.9–10.3)
CO2: 19 mmol/L — AB (ref 22–32)
CREATININE: 1.31 mg/dL — AB (ref 0.44–1.00)
Chloride: 105 mmol/L (ref 101–111)
GFR, EST AFRICAN AMERICAN: 48 mL/min — AB (ref >60)
GFR, EST NON AFRICAN AMERICAN: 41 mL/min — AB (ref >60)
Glucose, Bld: 148 mg/dL — ABNORMAL HIGH (ref 65–99)
Potassium: 3.7 mmol/L (ref 3.5–5.1)
SODIUM: 137 mmol/L (ref 135–145)

## 2016-04-11 LAB — CBC
HEMATOCRIT: 31.3 % — AB (ref 36.0–46.0)
Hemoglobin: 9.2 g/dL — ABNORMAL LOW (ref 12.0–15.0)
MCH: 25.8 pg — ABNORMAL LOW (ref 26.0–34.0)
MCHC: 29.4 g/dL — AB (ref 30.0–36.0)
MCV: 87.7 fL (ref 78.0–100.0)
Platelets: 292 10*3/uL (ref 150–400)
RBC: 3.57 MIL/uL — ABNORMAL LOW (ref 3.87–5.11)
RDW: 15.6 % — AB (ref 11.5–15.5)
WBC: 11.8 10*3/uL — AB (ref 4.0–10.5)

## 2016-04-11 LAB — GLUCOSE, CAPILLARY
GLUCOSE-CAPILLARY: 211 mg/dL — AB (ref 65–99)
GLUCOSE-CAPILLARY: 137 mg/dL — AB (ref 65–99)
GLUCOSE-CAPILLARY: 170 mg/dL — AB (ref 65–99)
GLUCOSE-CAPILLARY: 161 mg/dL — AB (ref 65–99)

## 2016-04-11 LAB — LACTIC ACID, PLASMA
LACTIC ACID, VENOUS: 4.5 mmol/L — AB (ref 0.5–1.9)
LACTIC ACID, VENOUS: 0.5 mmol/L (ref 0.5–1.9)

## 2016-04-11 MED ORDER — ONDANSETRON HCL 4 MG/2ML IJ SOLN
4.0000 mg | Freq: Four times a day (QID) | INTRAMUSCULAR | Status: DC | PRN
  Administered 2016-04-11 – 2016-04-12 (×2): 4 mg via INTRAVENOUS
  Filled 2016-04-11 (×3): qty 2

## 2016-04-11 MED ORDER — ENOXAPARIN SODIUM 40 MG/0.4ML ~~LOC~~ SOLN
40.0000 mg | SUBCUTANEOUS | Status: DC
  Administered 2016-04-11: 40 mg via SUBCUTANEOUS
  Filled 2016-04-11: qty 0.4

## 2016-04-11 MED ORDER — PHENOBARBITAL 32.4 MG PO TABS
97.2000 mg | ORAL_TABLET | ORAL | Status: DC
  Administered 2016-04-14: 97.2 mg via ORAL
  Filled 2016-04-11 (×2): qty 3

## 2016-04-11 MED ORDER — SODIUM CHLORIDE 0.9 % IV BOLUS (SEPSIS)
1000.0000 mL | Freq: Once | INTRAVENOUS | Status: AC
  Administered 2016-04-11: 1000 mL via INTRAVENOUS

## 2016-04-11 MED ORDER — PHENOBARBITAL 32.4 MG PO TABS
194.4000 mg | ORAL_TABLET | ORAL | Status: DC
  Administered 2016-04-11 – 2016-04-13 (×2): 194.4 mg via ORAL
  Filled 2016-04-11 (×2): qty 6

## 2016-04-11 MED ORDER — MORPHINE SULFATE (PF) 2 MG/ML IV SOLN
2.0000 mg | INTRAVENOUS | Status: DC | PRN
  Administered 2016-04-11 – 2016-04-13 (×12): 2 mg via INTRAVENOUS
  Filled 2016-04-11 (×12): qty 1

## 2016-04-11 NOTE — Progress Notes (Signed)
Patient ID: Natalie [name], female   DOB: [birth year], 65 y.o.   MRN: [id number]   Pt is hemodynamically stable Plan for R PCN in IR 7/13 per Dr Earleen Newport  Will hold Lovenox til after procedure  Npo.Marland KitchenMarland Kitchen

## 2016-04-11 NOTE — Progress Notes (Signed)
CRITICAL VALUE ALERT  Critical value received:  Lactic 4.5  Date of notification:  04/11/16  Time of notification:  0237  Critical value read back: Yes  Nurse who received alert:  Lydon Vansickle B. Rulon Eisenmenger, RN  MD notified (1st page):  Tylene Fantasia  Time of first page:  0245  MD notified (2nd page):  Time of second page:  Responding MD/Mid-level:  Tylene Fantasia  Time MD responded:  [phone]

## 2016-04-11 NOTE — Progress Notes (Signed)
Patient monitored since admission on [facility] sepsis radar list. Seen at bedside earlier this evening at 2100 resting quietly. [facility] NP updated per floor RN tonight regarding lactic acid serial labs and VS, fluids ordered. Seen this AM at 0400, resting quietly in bed VS 100.3 rectal, 103 HR, 124/64 BP, 20 RR, denies pain or discomfort. Patient will remain on sepsis radar list today. RN advised to notify [facility] RN for worsening changes.

## 2016-04-11 NOTE — Progress Notes (Signed)
PROGRESS NOTE    Natalie [name]  [id number] DOB: 1951-04-20 DOA: 04/10/2016 PCP: Leandrew Koyanagi, MD  Brief Narrative: 65/F with DM, HTN, admitted with Sepsis due to R pyelonephritis, CT this am with hydronephrosis and 2.4cm stone  Assessment & Plan:   Sepsis due to R pyelonephritis -with severe R hydronephrosis and 2.4 Cm stone at Renal pelvis -Urology Dr.Dahlstedt consulted, recommended IR consult urgently to decompress R Kidney/Nephrostomy and Urology will consult for definitive management of stone -D/w Dr.Wagner, IR this am who will plan Perc Nephrostomy  -continue IVF, IV ceftriaxone -Vital signs improved at this time, lactate improving -unfortunately got a dose of Lovenox this am for DVT prophylaxis and hence procedure delayed till tomorrow am per IR.  DM 2: hold metformin, SSI  H/o seizures -resume Phenobarb, asked PharmD to restart this   HTN (hypertension) -stable, hold ARB  AKI -due to sepsis, ARB,  -hydrate, monitor  DVT prophylaxis:SCDs Code Status:Full Code Family Communication:None at bedside Disposition Plan: Home when improved 2-3days   Consultants:  Urology Dr.Dahlsted IR Dr.Wagner   Procedures:  Antimicrobials: Ceftriaxone  Subjective: R flank pain, feels cold  Objective: Filed Vitals:   04/11/16 0305 04/11/16 0412 04/11/16 0505 04/11/16 0825  BP: 135/61 124/64 129/63 118/90  Pulse: 109 103 118 105  Temp: 101.8 F (38.8 C) 100.3 F (37.9 C) 100.1 F (37.8 C)   TempSrc: Rectal Rectal Rectal   Resp: 18 20 17 18   Height:      Weight:  75.297 kg (166 lb)    SpO2: 93% 93% 95% 96%    Intake/Output Summary (Last 24 hours) at 04/11/16 0837 Last data filed at 04/11/16 0717  Gross per 24 hour  Intake 1086.67 ml  Output    675 ml  Net 411.67 ml   Filed Weights   04/10/16 2144 04/11/16 0412  Weight: 76.76 kg (169 lb 3.6 oz) 75.297 kg (166 lb)    Examination:  General exam: Appears calm but uncomfortable, no distress Respiratory  system: Clear to auscultation. Respiratory effort normal. Cardiovascular system: S1 & S2 heard, RRR. No JVD, murmurs, rubs, gallops or clicks. No pedal edema. Gastrointestinal system: Abdomen soft, R CVA tenderness, BS present Central nervous system: Alert and oriented. No focal neurological deficits. Extremities: Symmetric 5 x 5 power. Skin: No rashes, lesions or ulcers Psychiatry: Judgement and insight appear normal. Mood & affect appropriate.     Data Reviewed: I have personally reviewed following labs and imaging studies  CBC:  Recent Labs Lab 04/10/16 1530 04/11/16 0119  WBC 17.6* 11.8*  NEUTROABS 14.2*  --   HGB 9.8* 9.2*  HCT 32.9* 31.3*  MCV 87.0 87.7  PLT 294 [id number]   Basic Metabolic Panel:  Recent Labs Lab 04/10/16 1530 04/11/16 0119  NA 135 137  K 4.7 3.7  CL 102 105  CO2 19* 19*  GLUCOSE 245* 148*  BUN 19 14  CREATININE 1.40* 1.31*  CALCIUM 9.8 8.6*   GFR: Estimated Creatinine Clearance: 35.1 mL/min (by C-G formula based on Cr of 1.31). Liver Function Tests:  Recent Labs Lab 04/10/16 1530  AST 19  ALT 13*  ALKPHOS 61  BILITOT 0.5  PROT 7.5  ALBUMIN 3.7   No results for input(s): LIPASE, AMYLASE in the last 168 hours. No results for input(s): AMMONIA in the last 168 hours. Coagulation Profile:  Recent Labs Lab 04/10/16 1923  INR 1.28   Cardiac Enzymes: No results for input(s): CKTOTAL, CKMB, CKMBINDEX, TROPONINI in the last 168 hours. BNP (last 3  results) No results for input(s): PROBNP in the last 8760 hours. HbA1C: No results for input(s): HGBA1C in the last 72 hours. CBG:  Recent Labs Lab 04/10/16 2240  GLUCAP 231*   Lipid Profile: No results for input(s): CHOL, HDL, LDLCALC, TRIG, CHOLHDL, LDLDIRECT in the last 72 hours. Thyroid Function Tests: No results for input(s): TSH, T4TOTAL, FREET4, T3FREE, THYROIDAB in the last 72 hours. Anemia Panel: No results for input(s): VITAMINB12, FOLATE, FERRITIN, TIBC, IRON, RETICCTPCT in  the last 72 hours. Urine analysis:    Component Value Date/Time   COLORURINE YELLOW 04/10/2016 1116   APPEARANCEUR TURBID* 04/10/2016 1116   LABSPEC 1.020 04/10/2016 1116   PHURINE 6.0 04/10/2016 1116   GLUCOSEU NEGATIVE 04/10/2016 1116   HGBUR LARGE* 04/10/2016 1116   BILIRUBINUR NEGATIVE 04/10/2016 1116   BILIRUBINUR neg 11/30/2014 1834   KETONESUR NEGATIVE 04/10/2016 1116   PROTEINUR 100* 04/10/2016 1116   PROTEINUR >=300 11/30/2014 1834   UROBILINOGEN 0.2 11/30/2014 1834   UROBILINOGEN 0.2 08/31/2014 2056   NITRITE POSITIVE* 04/10/2016 1116   NITRITE neg 11/30/2014 1834   LEUKOCYTESUR LARGE* 04/10/2016 1116   Sepsis Labs: @LABRCNTIP (procalcitonin:4,lacticidven:4)  )No results found for this or any previous visit (from the past 240 hour(s)).       Radiology Studies: Dg Chest Port 1 View  04/10/2016  CLINICAL DATA:  Fevers for 1 day, initial encounter EXAM: PORTABLE CHEST 1 VIEW COMPARISON:  09/06/2014 FINDINGS: Cardiac shadow is mildly prominent accentuated by the portable technique. Patient rotation to the right is noted accentuating the mediastinal markings. The overall inspiratory effort is poor with crowding of the vascular markings although no focal confluent infiltrate is seen. IMPRESSION: Poor inspiratory effort.  No definitive infiltrate is noted. Electronically Signed   By: Inez Catalina M.D.   On: 04/10/2016 19:39   Ct Renal Stone Study  04/11/2016  CLINICAL DATA:  65 year old female with right flank pain and fever since yesterday. Initial encounter. EXAM: CT ABDOMEN AND PELVIS WITHOUT CONTRAST TECHNIQUE: Multidetector CT imaging of the abdomen and pelvis was performed following the standard protocol without IV contrast. COMPARISON:  Abdomen radiograph 09/01/2014. [facility] CT Abdomen and Pelvis 01/01/2011 FINDINGS: Mild cardiomegaly and elevation of the right hemidiaphragm are chronic. No pleural effusion. Mild atelectasis or scarring at both lung  bases. Calcified coronary artery atherosclerosis. No acute osseous abnormality identified. Numerous bilateral posterior flank calcified injection granulomas and/or fat necrosis with confluent areas of subcutaneous scarring. Aortoiliac calcified atherosclerosis noted. Small volume pelvic free fluid. Mildly increased dystrophic calcifications in the uterus which might be fibroid related. Otherwise stable and negative noncontrast uterus and adnexa. Increased gaseous distension of the rectum. Sigmoid redundancy with mild to moderate diverticulosis, but no active inflammation. Mild diverticulosis in the left colon without inflammation. Retained stool in the transverse colon and right colon. A small volume of free fluid in the right lower quadrant obscures the appendix today. Decompressed terminal ileum. No dilated small bowel. Decompressed stomach. Moderate size gastric hiatal hernia has increased since 2012. Surgically absent gallbladder. Negative noncontrast liver, spleen, pancreas and adrenal glands. Left renal atrophy since 2012. Associated left perinephric stranding. Mild periureteral stranding. There is a large oval calculus at the left ureterovesical junction measuring 11 mm length by 8 mm diameter. There also appears to be a second similar 9-10 mm to upstream distal left ureter calculus (series 301, image 71). There are small superimposed pelvic phleboliths. On the right there is nephromegaly and moderate to severe hydronephrosis with a large, lobulated 16 x 24  x 24 mm (AP by transverse by CC) calculus at the right renal pelvis. Evidence of surrounding urothelial thickening. Additional right intra renal calculi up to 8 mm. Right hydroureter and mild to moderate periureteral stranding. Generalized mild to moderate right para renal space stranding. No additional right ureteral calculus to the level of the ureterovesical junction. No other calculus within the urinary bladder. Low level retroperitoneal lymph nodes  are increased since 2012, appear reactive. IMPRESSION: 1. Acute inflammation of the right kidney and ureter with moderate to severe right hydronephrosis and bulky 2.4 cm stone at the right renal pelvis. Probable underlying chronic urothelial inflammation. Pyelonephritis/pyonephrosis superimposed on obstruction should be considered, and there is reactive retroperitoneal lymphadenopathy as well as perinephric stranding and small volume abdominal and pelvic free fluid. 2. 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 3. Progressed moderate gastric hiatal hernia. 4. Calcified aortic atherosclerosis. Electronically Signed   By: Genevie Ann M.D.   On: 04/11/2016 08:11        Scheduled Meds: . atorvastatin  20 mg Oral q1800  . cefTRIAXone (ROCEPHIN)  IV  1 g Intravenous Q24H  . clobetasol cream   Topical BID  . enoxaparin (LOVENOX) injection  40 mg Subcutaneous Q24H  . fluorometholone  1 drop Both Eyes BID  . insulin aspart  0-5 Units Subcutaneous QHS  . insulin aspart  0-9 Units Subcutaneous TID WC  . levothyroxine  75 mcg Oral QAC breakfast   Continuous Infusions: . sodium chloride 125 mL/hr at 04/11/16 0515     LOS: 1 day    Time spent: 77min    Domenic Polite, MD [facility] Pager [phone]  If 7PM-7AM, please contact night-coverage [email or web address] Password [facility] 04/11/2016, 8:37 AM

## 2016-04-11 NOTE — Progress Notes (Signed)
RRT paged to solicit further evaluation of patient with recheck Lactic of 4.5. Patient experiencing periods of restlessness and shivering.  Ongoing monitoring and assessment.  Mid-level provider notified previously.

## 2016-04-11 NOTE — Progress Notes (Signed)
Confirmed phenobarbital dose with [place] as patient was sleeping and I could not discuss with her.  Phenobarbital 97.2mg  tablets- 1 tablet daily EXCEPT 2 tablets on MWF. This is also what we have recorded on her medication history. This was last filled on 01/30/2016 for #130. She should be getting a refill soon based on this number.  Restarted her phenobarbital as listed above.  Pharmacy to sign off, please re-consult if needed.  Jubal Rademaker D. Sheria Rosello, PharmD, BCPS Clinical Pharmacist Pager: [phone] 04/11/2016 2:11 PM

## 2016-04-11 NOTE — Consult Note (Signed)
Chief Complaint: Patient was seen in consultation today for right percutaneous nephrostomy placement Chief Complaint  Patient presents with  . Fever  . Back Pain   at the request of Dr Domenic Polite  Referring Physician(s): Dr Domenic Polite  Supervising Physician: Corrie Mckusick  Patient Status: Inpatient  History of Present Illness: Natalie [name] is a 65 y.o. female   Hx recurrent UTIs; renal stones Presented to ED 7/11 with confusion; fever Leukocytosis Septic +UTI; pyelonephritis CT 7/12: IMPRESSION: 1. Acute inflammation of the right kidney and ureter with moderate to severe right hydronephrosis and bulky 2.4 cm stone at the right renal pelvis. Probable underlying chronic urothelial inflammation. Pyelonephritis/pyonephrosis superimposed on obstruction should be considered, and there is reactive retroperitoneal lymphadenopathy as well as perinephric stranding and small volume abdominal and pelvic free fluid. 2. 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 3. Progressed moderate gastric hiatal hernia. 4. Calcified aortic atherosclerosis.  Request now for R Percutaneous nephrostomy placement Wbc decreased to 11.8 (17.6) afeb IV Rocephin Tmax 101.8 last pm; 100.1 now Tachycardic 105  BP wnl  Dr Earleen Newport has reviewed imaging and approves procedure Pt received Lovenox Inj 40 mg 900 am today Will discuss with Dr Vernard Gambles if procedure will be performed today or tomorrow secondary Lovenox today (pt and MD aware)  Past Medical History  Diagnosis Date  . Hyperlipidemia   . Hypertension   . Diabetes mellitus without complication [facility])     Past Surgical History  Procedure Laterality Date  . Cholecystectomy    . Cesarean section      Allergies: Compazine; Cymbalta; Escitalopram oxalate; Methadone; and Nitrofuran derivatives  Medications: Prior to Admission medications     Medication Sig Start Date End Date Taking? Authorizing Provider  atorvastatin (LIPITOR) 20 MG tablet Take 1 tablet (20 mg total) by mouth daily. 01/04/16  Yes Leandrew Koyanagi, MD  butorphanol (STADOL) 10 MG/ML nasal spray Place 1 spray into the nose every 4 (four) hours as needed for migraine. May refill weekly. To follow last prescription of 02/29/16. Awaiting transfer to Pain Management program at [facility]. 04/02/16  Yes Leandrew Koyanagi, MD  clobetasol cream (TEMOVATE) 0.05 % Apply topically 2 (two) times daily. 01/21/16  Yes Leandrew Koyanagi, MD  ESTRACE VAGINAL 0.1 MG/GM vaginal cream insert 1/4 gram vaginally two times a week 12/03/15  Yes Historical Provider, MD  fluorometholone (FML) 0.1 % ophthalmic suspension USE 1 DROP INTO BOTH EYES 4 TIMES A DAY FOR 7 DAYS 03/28/16  Yes Historical Provider, MD  levothyroxine (SYNTHROID, LEVOTHROID) 75 MCG tablet Take 1 tablet (75 mcg total) by mouth daily. 01/04/16  Yes Leandrew Koyanagi, MD  losartan (COZAAR) 50 MG tablet Take 1 tablet (50 mg total) by mouth daily. 01/04/16  Yes Leandrew Koyanagi, MD  metFORMIN (GLUCOPHAGE) 1000 MG tablet Take 1 tablet (1,000 mg total) by mouth 2 (two) times daily with a meal. 01/04/16  Yes Leandrew Koyanagi, MD  ondansetron (ZOFRAN-ODT) 8 MG disintegrating tablet DISSOLVE 1 TABLET ON OR UNDER THE TONGUE EVERY 8 HOURS AS NEEDED FOR NAUSEA 12/29/15  Yes Leandrew Koyanagi, MD  PHENobarbital (LUMINAL) 97.2 MG tablet TAKE TWO TABLETS BY MOUTH ONCE DAILY ON  MON,  WED,  AND  FRI  AND  1  TABLET  DAILY  ON  OTHER  DAYS 01/04/16  Yes Leandrew Koyanagi, MD  terconazole (TERAZOL 3) 0.8 % vaginal cream Place  1 applicator vaginally at bedtime.  12/05/15  Yes Historical Provider, MD  zolpidem (AMBIEN) 10 MG tablet take 1 tablet by mouth at bedtime for sleep if needed 04/02/16  Yes Leandrew Koyanagi, MD     Family History  Problem Relation Age of Onset  . Heart disease Father   . Diabetes Father     Social History   Social History  .  Marital Status: Married    Spouse Name: N/A  . Number of Children: N/A  . Years of Education: N/A   Social History Main Topics  . Smoking status: Never Smoker   . Smokeless tobacco: Never Used  . Alcohol Use: No  . Drug Use: None  . Sexual Activity: Not Asked   Other Topics Concern  . None   Social History Narrative    Review of Systems: A 12 point ROS discussed and pertinent positives are indicated in the HPI above.  All other systems are negative.  Review of Systems  Constitutional: Positive for fever, activity change, appetite change and fatigue.  Respiratory: Negative for shortness of breath.   Gastrointestinal: Negative for abdominal pain.  Musculoskeletal: Positive for back pain.  Neurological: Positive for weakness.  Psychiatric/Behavioral: Negative for behavioral problems and confusion.    Vital Signs: BP 118/90 mmHg  Pulse 105  Temp(Src) 100.1 F (37.8 C) (Rectal)  Resp 18  Ht 4\' 9"  (1.448 m)  Wt 166 lb (75.297 kg)  BMI 35.91 kg/m2  SpO2 96%  Physical Exam  Constitutional: She is oriented to person, place, and time.  Cardiovascular: Normal rate, regular rhythm and normal heart sounds.   Pulmonary/Chest: Effort normal and breath sounds normal. She has no wheezes.  Abdominal: Soft. Bowel sounds are normal.  Musculoskeletal: Normal range of motion.  Neurological: She is alert and oriented to person, place, and time.  Skin: Skin is warm and dry.  Psychiatric: She has a normal mood and affect. Her behavior is normal. Judgment and thought content normal.  Nursing note and vitals reviewed.   Mallampati Score:  MD Evaluation Airway: WNL Heart: WNL Abdomen: WNL Chest/ Lungs: WNL ASA  Classification: 3 Mallampati/Airway Score: Two  Imaging: Dg Chest Port 1 View  04/10/2016  CLINICAL DATA:  Fevers for 1 day, initial encounter EXAM: PORTABLE CHEST 1 VIEW COMPARISON:  09/06/2014 FINDINGS: Cardiac shadow is mildly prominent accentuated by the portable  technique. Patient rotation to the right is noted accentuating the mediastinal markings. The overall inspiratory effort is poor with crowding of the vascular markings although no focal confluent infiltrate is seen. IMPRESSION: Poor inspiratory effort.  No definitive infiltrate is noted. Electronically Signed   By: Inez Catalina M.D.   On: 04/10/2016 19:39   Ct Renal Stone Study  04/11/2016  CLINICAL DATA:  65 year old female with right flank pain and fever since yesterday. Initial encounter. EXAM: CT ABDOMEN AND PELVIS WITHOUT CONTRAST TECHNIQUE: Multidetector CT imaging of the abdomen and pelvis was performed following the standard protocol without IV contrast. COMPARISON:  Abdomen radiograph 09/01/2014. [facility] CT Abdomen and Pelvis 01/01/2011 FINDINGS: Mild cardiomegaly and elevation of the right hemidiaphragm are chronic. No pleural effusion. Mild atelectasis or scarring at both lung bases. Calcified coronary artery atherosclerosis. No acute osseous abnormality identified. Numerous bilateral posterior flank calcified injection granulomas and/or fat necrosis with confluent areas of subcutaneous scarring. Aortoiliac calcified atherosclerosis noted. Small volume pelvic free fluid. Mildly increased dystrophic calcifications in the uterus which might be fibroid related. Otherwise stable and negative noncontrast uterus and adnexa.  Increased gaseous distension of the rectum. Sigmoid redundancy with mild to moderate diverticulosis, but no active inflammation. Mild diverticulosis in the left colon without inflammation. Retained stool in the transverse colon and right colon. A small volume of free fluid in the right lower quadrant obscures the appendix today. Decompressed terminal ileum. No dilated small bowel. Decompressed stomach. Moderate size gastric hiatal hernia has increased since 2012. Surgically absent gallbladder. Negative noncontrast liver, spleen, pancreas and adrenal glands. Left renal  atrophy since 2012. Associated left perinephric stranding. Mild periureteral stranding. There is a large oval calculus at the left ureterovesical junction measuring 11 mm length by 8 mm diameter. There also appears to be a second similar 9-10 mm to upstream distal left ureter calculus (series 301, image 71). There are small superimposed pelvic phleboliths. On the right there is nephromegaly and moderate to severe hydronephrosis with a large, lobulated 16 x 24 x 24 mm (AP by transverse by CC) calculus at the right renal pelvis. Evidence of surrounding urothelial thickening. Additional right intra renal calculi up to 8 mm. Right hydroureter and mild to moderate periureteral stranding. Generalized mild to moderate right para renal space stranding. No additional right ureteral calculus to the level of the ureterovesical junction. No other calculus within the urinary bladder. Low level retroperitoneal lymph nodes are increased since 2012, appear reactive. IMPRESSION: 1. Acute inflammation of the right kidney and ureter with moderate to severe right hydronephrosis and bulky 2.4 cm stone at the right renal pelvis. Probable underlying chronic urothelial inflammation. Pyelonephritis/pyonephrosis superimposed on obstruction should be considered, and there is reactive retroperitoneal lymphadenopathy as well as perinephric stranding and small volume abdominal and pelvic free fluid. 2. 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 3. Progressed moderate gastric hiatal hernia. 4. Calcified aortic atherosclerosis. Electronically Signed   By: Genevie Ann M.D.   On: 04/11/2016 08:11    Labs:  CBC:  Recent Labs  08/31/15 1119 01/04/16 1430 04/10/16 1530 04/11/16 0119  WBC 8.5 10.6 17.6* 11.8*  HGB 10.4* 9.8* 9.8* 9.2*  HCT 33.3* 31.6* 32.9* 31.3*  PLT 359 365 294 292    COAGS:  Recent Labs  04/10/16 1923  INR 1.28  APTT 34     BMP:  Recent Labs  08/31/15 1119 01/04/16 1430 04/10/16 1530 04/11/16 0119  NA 140 139 135 137  K 5.1 5.6* 4.7 3.7  CL 104 106 102 105  CO2 23 24 19* 19*  GLUCOSE 184* 136* 245* 148*  BUN 26* 17 19 14   CALCIUM 9.7 10.2 9.8 8.6*  CREATININE 0.93 0.99 1.40* 1.31*  GFRNONAA  --   --  38* 41*  GFRAA  --   --  44* 48*    LIVER FUNCTION TESTS:  Recent Labs  08/31/15 1119 01/04/16 1430 04/10/16 1530  BILITOT 0.2 0.2 0.5  AST 11 12 19   ALT 9 10 13*  ALKPHOS 59 65 61  PROT 6.9 7.1 7.5  ALBUMIN 3.9 4.1 3.7    TUMOR MARKERS: No results for input(s): AFPTM, CEA, CA199, CHROMGRNA in the last 8760 hours.  Assessment and Plan:  Right hydronephrosis secondary obstructive renal stone +pyelonephritis; recurrent UTI Scheduled for R Percutaneous nephrostomy placement Did receive Lovenox Inj 40 mg this am Dr Vernard Gambles to decide on timing of procedure; today or in am Risks and Benefits discussed with the patient including, but not limited to infection, bleeding, significant bleeding causing loss or decrease in  renal function or damage to adjacent structures.  All of the patient's questions were answered, patient is agreeable to proceed. Consent signed and in chart.   Thank you for this interesting consult.  I greatly enjoyed meeting Natalie [name] and look forward to participating in their care.  A copy of this report was sent to the requesting provider on this date.  Electronically Signed: Tahja Liao A 04/11/2016, 9:31 AM   I spent a total of 40 Minutes    in face to face in clinical consultation, greater than 50% of which was counseling/coordinating care for R PCN

## 2016-04-11 NOTE — Consult Note (Addendum)
Urology Consult   Physician requesting consult: Dr. Domenic Polite  Reason for consult: right renal pelvis stone  History of Present Illness: Natalie [name] is a 65 y.o. with history of hypertension, DM, migraines, seizure disorder, hyperlipidemia, recurrent UTI's, renal stones who presented to the ED yesterday with fever, back pain, nausea. In the ED she was febrile, tachycardia and had a WBC of 17.6 with elevated lactate of 4.5. A CT scan demonstrated a 2.5cm right renal pelvis stone with smaller lower pole and interpolar calyceal stones with mild hydronephrosis and atrophied left kidney. She was admitted and started on Ceftriaxone with downtrending fever curve and lactate now of 0.5. She was due to get a left nephrostomy tube today but this was deferred due to receiving DVT prophylaxis with Lovenox. Her culture is growing >100,000 GNRs and her WBC is now 11.8. She endorses some nausea and right abdominal/back pain.  She believes that she has passed stones in the past and has never needed surgery but review of records show that she had undergone right stent placement and right ESWL in December 2008 by Dr. Jeffie Pollock.  Past Medical History  Diagnosis Date  . Hyperlipidemia   . Hypertension   . Diabetes mellitus without complication [facility])     Past Surgical History  Procedure Laterality Date  . Cholecystectomy    . Cesarean section       Current Hospital Medications:  Home meds:    Medication List    ASK your doctor about these medications        atorvastatin 20 MG tablet  Commonly known as:  LIPITOR  Take 1 tablet (20 mg total) by mouth daily.     butorphanol 10 MG/ML nasal spray  Commonly known as:  STADOL  Place 1 spray into the nose every 4 (four) hours as needed for migraine. May refill weekly. To follow last prescription of 02/29/16. Awaiting transfer to Pain Management program at [facility].     clobetasol cream 0.05 %  Commonly known as:  TEMOVATE  Apply topically 2 (two) times  daily.     ESTRACE VAGINAL 0.1 MG/GM vaginal cream  Generic drug:  estradiol  insert 1/4 gram vaginally two times a week     fluorometholone 0.1 % ophthalmic suspension  Commonly known as:  FML  USE 1 DROP INTO BOTH EYES 4 TIMES A DAY FOR 7 DAYS     levothyroxine 75 MCG tablet  Commonly known as:  SYNTHROID, LEVOTHROID  Take 1 tablet (75 mcg total) by mouth daily.     losartan 50 MG tablet  Commonly known as:  COZAAR  Take 1 tablet (50 mg total) by mouth daily.     metFORMIN 1000 MG tablet  Commonly known as:  GLUCOPHAGE  Take 1 tablet (1,000 mg total) by mouth 2 (two) times daily with a meal.     ondansetron 8 MG disintegrating tablet  Commonly known as:  ZOFRAN-ODT  DISSOLVE 1 TABLET ON OR UNDER THE TONGUE EVERY 8 HOURS AS NEEDED FOR NAUSEA     PHENobarbital 97.2 MG tablet  Commonly known as:  LUMINAL  TAKE TWO TABLETS BY MOUTH ONCE DAILY ON  MON,  WED,  AND  FRI  AND  1  TABLET  DAILY  ON  OTHER  DAYS     terconazole 0.8 % vaginal cream  Commonly known as:  TERAZOL 3  Place 1 applicator vaginally at bedtime.     zolpidem 10 MG tablet  Commonly known as:  AMBIEN  take 1 tablet by mouth at bedtime for sleep if needed        Scheduled Meds: . atorvastatin  20 mg Oral q1800  . cefTRIAXone (ROCEPHIN)  IV  1 g Intravenous Q24H  . clobetasol cream   Topical BID  . fluorometholone  1 drop Both Eyes BID  . insulin aspart  0-5 Units Subcutaneous QHS  . insulin aspart  0-9 Units Subcutaneous TID WC  . levothyroxine  75 mcg Oral QAC breakfast  . phenobarbital  194.4 mg Oral Once per day on Mon Wed Fri  . [START ON 04/12/2016] phenobarbital  97.2 mg Oral Once per day on Sun Tue Thu Sat   Continuous Infusions: . sodium chloride 125 mL/hr at 04/11/16 0515   PRN Meds:.acetaminophen, butorphanol, morphine injection, ondansetron (ZOFRAN) IV  Allergies:  Allergies  Allergen Reactions  . Compazine     Bad headache, vomiting   . Cymbalta [Duloxetine Hcl]     unknown  .  Escitalopram Oxalate Other (See Comments)    Can't sleep  . Methadone Hives  . Nitrofuran Derivatives     unknown    Family History  Problem Relation Age of Onset  . Heart disease Father   . Diabetes Father     Social History:  reports that she has never smoked. She has never used smokeless tobacco. She reports that she does not drink alcohol. Her drug history is not on file.  ROS: A complete review of systems was performed.  All systems are negative except for pertinent findings as noted.  Physical Exam:  Vital signs in last 24 hours: Temp:  [100.1 F (37.8 C)-103 F (39.4 C)] 100.1 F (37.8 C) (07/12 0505) Pulse Rate:  [103-120] 105 (07/12 0825) Resp:  [17-20] 18 (07/12 0825) BP: (117-163)/(56-94) 118/90 mmHg (07/12 0825) SpO2:  [91 %-100 %] 96 % (07/12 0825) Weight:  [75.297 kg (166 lb)-76.76 kg (169 lb 3.6 oz)] 75.297 kg (166 lb) (07/12 0412) Constitutional:  Alert and oriented, No acute distress Cardiovascular: normal peripheral perfusion Respiratory: Normal respiratory effort  GI: Abdomen is soft, tender to palpation in RUQ and RLQ, nondistended, no abdominal masses GU: Right CVA tenderness Lymphatic: No lymphadenopathy Neurologic: Grossly intact, no focal deficits Psychiatric: Normal mood and affect  Laboratory Data:   Recent Labs  04/10/16 1530 04/11/16 0119  WBC 17.6* 11.8*  HGB 9.8* 9.2*  HCT 32.9* 31.3*  PLT 294 292     Recent Labs  04/10/16 1530 04/11/16 0119  NA 135 137  K 4.7 3.7  CL 102 105  GLUCOSE 245* 148*  BUN 19 14  CALCIUM 9.8 8.6*  CREATININE 1.40* 1.31*     Results for orders placed or performed during the hospital encounter of 04/10/16 (from the past 24 hour(s))  CBC with Differential     Status: Abnormal   Collection Time: 04/10/16  3:30 PM  Result Value Ref Range   WBC 17.6 (H) 4.0 - 10.5 K/uL   RBC 3.78 (L) 3.87 - 5.11 MIL/uL   Hemoglobin 9.8 (L) 12.0 - 15.0 g/dL   HCT 32.9 (L) 36.0 - 46.0 %   MCV 87.0 78.0 - 100.0 fL    MCH 25.9 (L) 26.0 - 34.0 pg   MCHC 29.8 (L) 30.0 - 36.0 g/dL   RDW 15.6 (H) 11.5 - 15.5 %   Platelets 294 150 - 400 K/uL   Neutrophils Relative % 81 %   Neutro Abs 14.2 (H) 1.7 - 7.7 K/uL   Lymphocytes Relative 7 %  Lymphs Abs 1.2 0.7 - 4.0 K/uL   Monocytes Relative 12 %   Monocytes Absolute 2.2 (H) 0.1 - 1.0 K/uL   Eosinophils Relative 0 %   Eosinophils Absolute 0.0 0.0 - 0.7 K/uL   Basophils Relative 0 %   Basophils Absolute 0.0 0.0 - 0.1 K/uL  Comprehensive metabolic panel     Status: Abnormal   Collection Time: 04/10/16  3:30 PM  Result Value Ref Range   Sodium 135 135 - 145 mmol/L   Potassium 4.7 3.5 - 5.1 mmol/L   Chloride 102 101 - 111 mmol/L   CO2 19 (L) 22 - 32 mmol/L   Glucose, Bld 245 (H) 65 - 99 mg/dL   BUN 19 6 - 20 mg/dL   Creatinine, Ser 1.40 (H) 0.44 - 1.00 mg/dL   Calcium 9.8 8.9 - 10.3 mg/dL   Total Protein 7.5 6.5 - 8.1 g/dL   Albumin 3.7 3.5 - 5.0 g/dL   AST 19 15 - 41 U/L   ALT 13 (L) 14 - 54 U/L   Alkaline Phosphatase 61 38 - 126 U/L   Total Bilirubin 0.5 0.3 - 1.2 mg/dL   GFR calc non Af Amer 38 (L) >60 mL/min   GFR calc Af Amer 44 (L) >60 mL/min   Anion gap 14 5 - 15  Urine culture     Status: Abnormal (Preliminary result)   Collection Time: 04/10/16  3:41 PM  Result Value Ref Range   Specimen Description URINE, CLEAN CATCH    Special Requests NONE    Culture >=100,000 COLONIES/mL GRAM NEGATIVE RODS (A)    Report Status PENDING   Blood Culture (routine x 2)     Status: None (Preliminary result)   Collection Time: 04/10/16  6:01 PM  Result Value Ref Range   Specimen Description BLOOD LEFT ANTECUBITAL    Special Requests BOTTLES DRAWN AEROBIC ONLY 5CC    Culture NO GROWTH < 24 HOURS    Report Status PENDING   Blood Culture (routine x 2)     Status: None (Preliminary result)   Collection Time: 04/10/16  6:08 PM  Result Value Ref Range   Specimen Description BLOOD LEFT ANTECUBITAL    Special Requests BOTTLES DRAWN AEROBIC AND ANAEROBIC 5CC     Culture NO GROWTH < 24 HOURS    Report Status PENDING   I-Stat CG4 Lactic Acid, ED     Status: Abnormal   Collection Time: 04/10/16  6:17 PM  Result Value Ref Range   Lactic Acid, Venous 5.09 (HH) 0.5 - 1.9 mmol/L   Comment NOTIFIED PHYSICIAN   APTT     Status: None   Collection Time: 04/10/16  7:23 PM  Result Value Ref Range   aPTT 34 24 - 37 seconds  Protime-INR     Status: Abnormal   Collection Time: 04/10/16  7:23 PM  Result Value Ref Range   Prothrombin Time 16.1 (H) 11.6 - 15.2 seconds   INR 1.28 0.00 - 1.49  Lactic acid, plasma     Status: Abnormal   Collection Time: 04/10/16  7:23 PM  Result Value Ref Range   Lactic Acid, Venous 2.5 (HH) 0.5 - 1.9 mmol/L  Glucose, capillary     Status: Abnormal   Collection Time: 04/10/16 10:40 PM  Result Value Ref Range   Glucose-Capillary 231 (H) 65 - 99 mg/dL   Comment 1 Notify RN   Lactic acid, plasma     Status: Abnormal   Collection Time: 04/11/16  1:19 AM  Result Value Ref Range   Lactic Acid, Venous 4.5 (HH) 0.5 - 1.9 mmol/L  CBC     Status: Abnormal   Collection Time: 04/11/16  1:19 AM  Result Value Ref Range   WBC 11.8 (H) 4.0 - 10.5 K/uL   RBC 3.57 (L) 3.87 - 5.11 MIL/uL   Hemoglobin 9.2 (L) 12.0 - 15.0 g/dL   HCT 31.3 (L) 36.0 - 46.0 %   MCV 87.7 78.0 - 100.0 fL   MCH 25.8 (L) 26.0 - 34.0 pg   MCHC 29.4 (L) 30.0 - 36.0 g/dL   RDW 15.6 (H) 11.5 - 15.5 %   Platelets 292 150 - 400 K/uL  Basic metabolic panel     Status: Abnormal   Collection Time: 04/11/16  1:19 AM  Result Value Ref Range   Sodium 137 135 - 145 mmol/L   Potassium 3.7 3.5 - 5.1 mmol/L   Chloride 105 101 - 111 mmol/L   CO2 19 (L) 22 - 32 mmol/L   Glucose, Bld 148 (H) 65 - 99 mg/dL   BUN 14 6 - 20 mg/dL   Creatinine, Ser 1.31 (H) 0.44 - 1.00 mg/dL   Calcium 8.6 (L) 8.9 - 10.3 mg/dL   GFR calc non Af Amer 41 (L) >60 mL/min   GFR calc Af Amer 48 (L) >60 mL/min   Anion gap 13 5 - 15  Lactic acid, plasma     Status: None   Collection Time: 04/11/16   4:13 AM  Result Value Ref Range   Lactic Acid, Venous 0.5 0.5 - 1.9 mmol/L  Glucose, capillary     Status: Abnormal   Collection Time: 04/11/16  8:48 AM  Result Value Ref Range   Glucose-Capillary 211 (H) 65 - 99 mg/dL   Comment 1 Notify RN    Comment 2 Document in Chart   Glucose, capillary     Status: Abnormal   Collection Time: 04/11/16 11:55 AM  Result Value Ref Range   Glucose-Capillary 137 (H) 65 - 99 mg/dL   Comment 1 Notify RN    Comment 2 Document in Chart    Recent Results (from the past 240 hour(s))  Urine culture     Status: Abnormal (Preliminary result)   Collection Time: 04/10/16  3:41 PM  Result Value Ref Range Status   Specimen Description URINE, CLEAN CATCH  Final   Special Requests NONE  Final   Culture >=100,000 COLONIES/mL GRAM NEGATIVE RODS (A)  Final   Report Status PENDING  Incomplete  Blood Culture (routine x 2)     Status: None (Preliminary result)   Collection Time: 04/10/16  6:01 PM  Result Value Ref Range Status   Specimen Description BLOOD LEFT ANTECUBITAL  Final   Special Requests BOTTLES DRAWN AEROBIC ONLY 5CC  Final   Culture NO GROWTH < 24 HOURS  Final   Report Status PENDING  Incomplete  Blood Culture (routine x 2)     Status: None (Preliminary result)   Collection Time: 04/10/16  6:08 PM  Result Value Ref Range Status   Specimen Description BLOOD LEFT ANTECUBITAL  Final   Special Requests BOTTLES DRAWN AEROBIC AND ANAEROBIC 5CC  Final   Culture NO GROWTH < 24 HOURS  Final   Report Status PENDING  Incomplete    Renal Function:  Recent Labs  04/10/16 1530 04/11/16 0119  CREATININE 1.40* 1.31*   Estimated Creatinine Clearance: 35.1 mL/min (by C-G formula based on Cr of 1.31).  Radiologic Imaging: Dg Chest Port 1  View  04/10/2016  CLINICAL DATA:  Fevers for 1 day, initial encounter EXAM: PORTABLE CHEST 1 VIEW COMPARISON:  09/06/2014 FINDINGS: Cardiac shadow is mildly prominent accentuated by the portable technique. Patient rotation  to the right is noted accentuating the mediastinal markings. The overall inspiratory effort is poor with crowding of the vascular markings although no focal confluent infiltrate is seen. IMPRESSION: Poor inspiratory effort.  No definitive infiltrate is noted. Electronically Signed   By: Inez Catalina M.D.   On: 04/10/2016 19:39   Ct Renal Stone Study  04/11/2016  CLINICAL DATA:  65 year old female with right flank pain and fever since yesterday. Initial encounter. EXAM: CT ABDOMEN AND PELVIS WITHOUT CONTRAST TECHNIQUE: Multidetector CT imaging of the abdomen and pelvis was performed following the standard protocol without IV contrast. COMPARISON:  Abdomen radiograph 09/01/2014. [facility] CT Abdomen and Pelvis 01/01/2011 FINDINGS: Mild cardiomegaly and elevation of the right hemidiaphragm are chronic. No pleural effusion. Mild atelectasis or scarring at both lung bases. Calcified coronary artery atherosclerosis. No acute osseous abnormality identified. Numerous bilateral posterior flank calcified injection granulomas and/or fat necrosis with confluent areas of subcutaneous scarring. Aortoiliac calcified atherosclerosis noted. Small volume pelvic free fluid. Mildly increased dystrophic calcifications in the uterus which might be fibroid related. Otherwise stable and negative noncontrast uterus and adnexa. Increased gaseous distension of the rectum. Sigmoid redundancy with mild to moderate diverticulosis, but no active inflammation. Mild diverticulosis in the left colon without inflammation. Retained stool in the transverse colon and right colon. A small volume of free fluid in the right lower quadrant obscures the appendix today. Decompressed terminal ileum. No dilated small bowel. Decompressed stomach. Moderate size gastric hiatal hernia has increased since 2012. Surgically absent gallbladder. Negative noncontrast liver, spleen, pancreas and adrenal glands. Left renal atrophy since 2012.  Associated left perinephric stranding. Mild periureteral stranding. There is a large oval calculus at the left ureterovesical junction measuring 11 mm length by 8 mm diameter. There also appears to be a second similar 9-10 mm to upstream distal left ureter calculus (series 301, image 71). There are small superimposed pelvic phleboliths. On the right there is nephromegaly and moderate to severe hydronephrosis with a large, lobulated 16 x 24 x 24 mm (AP by transverse by CC) calculus at the right renal pelvis. Evidence of surrounding urothelial thickening. Additional right intra renal calculi up to 8 mm. Right hydroureter and mild to moderate periureteral stranding. Generalized mild to moderate right para renal space stranding. No additional right ureteral calculus to the level of the ureterovesical junction. No other calculus within the urinary bladder. Low level retroperitoneal lymph nodes are increased since 2012, appear reactive. IMPRESSION: 1. Acute inflammation of the right kidney and ureter with moderate to severe right hydronephrosis and bulky 2.4 cm stone at the right renal pelvis. Probable underlying chronic urothelial inflammation. Pyelonephritis/pyonephrosis superimposed on obstruction should be considered, and there is reactive retroperitoneal lymphadenopathy as well as perinephric stranding and small volume abdominal and pelvic free fluid. 2. 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 3. Progressed moderate gastric hiatal hernia. 4. Calcified aortic atherosclerosis. Electronically Signed   By: Genevie Ann M.D.   On: 04/11/2016 08:11    I independently reviewed the above imaging studies.  Impression/Recommendation: 65 yo female with recurrent UTIs and large 2.5cm right renal pelvis stone causing mild obstruction with mild right hydronephrosis. In addition, she is noted to have smaller nonobstructing calculi in  the right  lower pole and interpolar calyces. Of note, her left kidney appears to have atrophied likely secondary to chronic obstruction from left UVJ stones and thus is essentially of solitary kidney status. Clinically she appears to be improving with hydration and antibiotics but will need drainage of her right kidney given the obstruction and UTI.   - Recommend right nephrostomy tube placement tomorrow AM. If she were to clinically decompensate, would recommend emergent right nephrostomy tube placement. - Following drainage of her right kidney and adequate treatment of her UTI/pyelo we will arrange for definitive stone treatment, likely with right percutaneous nephrolithotomy  Lolita Rieger 04/11/2016, 3:28 PM   I have reviewed the patient's history. I have also reviewed all the above laboratory studies as well as the images of the CT scan. I agree with the above assessment and plan. I have discussed the above with the patient as well. She will need adequate management of her obstruction with a percutaneous tube, appropriate antibiotics, and eventually, outpatient follow-up and planning for percutaneous nephrolithotomy.

## 2016-04-11 NOTE — Care Management Important Message (Signed)
Important Message  Patient Details  Name: Natalie [name] MRN: [id number] Date of Birth: [birth year]   Medicare Important Message Given:  Yes    Loann Quill 04/11/2016, 11:10 AM

## 2016-04-12 ENCOUNTER — Inpatient Hospital Stay (HOSPITAL_COMMUNITY): Payer: Medicare Other

## 2016-04-12 DIAGNOSIS — E1129 Type 2 diabetes mellitus with other diabetic kidney complication: Secondary | ICD-10-CM

## 2016-04-12 LAB — URINE CULTURE

## 2016-04-12 LAB — CBC
HCT: 25.6 % — ABNORMAL LOW (ref 36.0–46.0)
Hemoglobin: 7.6 g/dL — ABNORMAL LOW (ref 12.0–15.0)
MCH: 25.8 pg — AB (ref 26.0–34.0)
MCHC: 29.7 g/dL — AB (ref 30.0–36.0)
MCV: 86.8 fL (ref 78.0–100.0)
PLATELETS: 167 10*3/uL (ref 150–400)
RBC: 2.95 MIL/uL — AB (ref 3.87–5.11)
RDW: 15.6 % — AB (ref 11.5–15.5)
WBC: 5.2 10*3/uL (ref 4.0–10.5)

## 2016-04-12 LAB — BASIC METABOLIC PANEL
ANION GAP: 6 (ref 5–15)
BUN: 8 mg/dL (ref 6–20)
CHLORIDE: 112 mmol/L — AB (ref 101–111)
CO2: 19 mmol/L — AB (ref 22–32)
Calcium: 7.4 mg/dL — ABNORMAL LOW (ref 8.9–10.3)
Creatinine, Ser: 1.02 mg/dL — ABNORMAL HIGH (ref 0.44–1.00)
GFR calc non Af Amer: 56 mL/min — ABNORMAL LOW (ref >60)
Glucose, Bld: 180 mg/dL — ABNORMAL HIGH (ref 65–99)
Potassium: 3.1 mmol/L — ABNORMAL LOW (ref 3.5–5.1)
Sodium: 137 mmol/L (ref 135–145)

## 2016-04-12 LAB — GLUCOSE, CAPILLARY
Glucose-Capillary: 149 mg/dL — ABNORMAL HIGH (ref 65–99)
Glucose-Capillary: 183 mg/dL — ABNORMAL HIGH (ref 65–99)
Glucose-Capillary: 233 mg/dL — ABNORMAL HIGH (ref 65–99)

## 2016-04-12 LAB — HEMOGLOBIN A1C
Hgb A1c MFr Bld: 7.7 % — ABNORMAL HIGH (ref 4.8–5.6)
Mean Plasma Glucose: 174 mg/dL

## 2016-04-12 MED ORDER — IBUPROFEN 600 MG PO TABS
600.0000 mg | ORAL_TABLET | Freq: Four times a day (QID) | ORAL | Status: DC | PRN
  Administered 2016-04-12: 600 mg via ORAL
  Filled 2016-04-12: qty 1

## 2016-04-12 MED ORDER — MIDAZOLAM HCL 2 MG/2ML IJ SOLN
INTRAMUSCULAR | Status: AC | PRN
  Administered 2016-04-12: 1 mg via INTRAVENOUS

## 2016-04-12 MED ORDER — FENTANYL CITRATE (PF) 100 MCG/2ML IJ SOLN
INTRAMUSCULAR | Status: AC | PRN
  Administered 2016-04-12 (×2): 50 ug via INTRAVENOUS

## 2016-04-12 MED ORDER — ONDANSETRON HCL 4 MG/2ML IJ SOLN: 4.0000 mg | Freq: Once | INTRAMUSCULAR | Status: DC

## 2016-04-12 MED ORDER — MIDAZOLAM HCL 2 MG/2ML IJ SOLN
INTRAMUSCULAR | Status: AC
  Filled 2016-04-12: qty 4

## 2016-04-12 MED ORDER — LIDOCAINE HCL 1 % IJ SOLN
INTRAMUSCULAR | Status: AC | PRN
  Administered 2016-04-12: 5 mL

## 2016-04-12 MED ORDER — LIDOCAINE HCL 1 % IJ SOLN
INTRAMUSCULAR | Status: AC
  Filled 2016-04-12: qty 20

## 2016-04-12 MED ORDER — FENTANYL CITRATE (PF) 100 MCG/2ML IJ SOLN
INTRAMUSCULAR | Status: DC
Start: 2016-04-12 — End: 2016-04-12
  Filled 2016-04-12: qty 4

## 2016-04-12 MED ORDER — MEPERIDINE HCL 25 MG/ML IJ SOLN
25.0000 mg | INTRAMUSCULAR | Status: DC
  Filled 2016-04-12: qty 1

## 2016-04-12 MED ORDER — SODIUM CHLORIDE 0.9 % IV SOLN
INTRAVENOUS | Status: AC
  Administered 2016-04-12: 11:00:00 via INTRAVENOUS

## 2016-04-12 MED ORDER — IOPAMIDOL (ISOVUE-300) INJECTION 61%
INTRAVENOUS | Status: AC
  Administered 2016-04-12: 10 mL
  Filled 2016-04-12: qty 50

## 2016-04-12 MED ORDER — POTASSIUM CHLORIDE CRYS ER 20 MEQ PO TBCR
40.0000 meq | EXTENDED_RELEASE_TABLET | Freq: Once | ORAL | Status: AC
  Administered 2016-04-12: 40 meq via ORAL
  Filled 2016-04-12: qty 2

## 2016-04-12 NOTE — Progress Notes (Signed)
Patient received 2 mg IV Morphine at 0259.  At [place], she is c/o severe right flank pain - rating as 8/10.  BP is stable but O2 level is as low as 82% on RA.  Patient placed on 2 LPM via [place] per MD order.  O2 levels came up to 97%-98%.  Per [facility], ok to give 2 mg IV Morphine and 4 mg IV Zofran.  She ordered stat portable chest xray.  Will continue to monitor patient.  [employer] RN-BC, WTA.

## 2016-04-12 NOTE — Progress Notes (Signed)
Natalie [name] Aug 21, 1951 [id number]  Brief hx 65yo with hx DM, HTN, hyperlipidemia, recurrent UTIs admitted on 7/11 with sepsis secondary to UTI/pyelonephritis. CT revealed 2.5cm right renal stone causing mild obstruction and subsequent mild right hydronephrosis. She is scheduled for right nephrostomy tube placement this am. Was called by RN this evening due to decreased O2 sats to 85%. Sats quickly returned to normal by sitting pt up in bed and with O2 via [place].   Subjective Pt denies any sob or cough, no n/v/d.   Objective General: obese female, easily awakened in NAD CV: S1S2 RRR, no m/r/g, no lower extremity edema Resp: decreased inspiratory effort, no increased wob, no w/r/c GI: abd obese soft, NT Psych: normal mood and affect  Today's Vitals   04/12/16 0427 04/12/16 0453 04/12/16 0514 04/12/16 0558  BP:  144/61  149/63  Pulse:  112  113  Temp:  102.9 F (39.4 C)  102.5 F (39.2 C)  TempSrc:  Oral  Oral  Resp:  21  20  Height:      Weight:      SpO2:  86% 98% 93%  PainSc: 8   8     CBC Latest Ref Rng 04/11/2016 04/10/2016 01/04/2016  WBC 4.0 - 10.5 K/uL 11.8(H) 17.6(H) 10.6  Hemoglobin 12.0 - 15.0 g/dL 9.2(L) 9.8(L) 9.8(L)  Hematocrit 36.0 - 46.0 % 31.3(L) 32.9(L) 31.6(L)  Platelets 150 - 400 K/uL 292 294 365   BMP Latest Ref Rng 04/11/2016 04/10/2016 01/04/2016  Glucose 65 - 99 mg/dL 148(H) 245(H) 136(H)  BUN 6 - 20 mg/dL 14 19 17   Creatinine 0.44 - 1.00 mg/dL 1.31(H) 1.40(H) 0.99  Sodium 135 - 145 mmol/L 137 135 139  Potassium 3.5 - 5.1 mmol/L 3.7 4.7 5.6(H)  Chloride 101 - 111 mmol/L 105 102 106  CO2 22 - 32 mmol/L 19(L) 19(L) 24  Calcium 8.9 - 10.3 mg/dL 8.6(L) 9.8 10.2    Assessment/Plan  Transient hypoxia, now resolved Question of edema vs developing infiltrate on exam. Pt does not appear to be volume overloaded on exam. Currently, O2 sats stable on 2L [place] and with better positioning. Could also be d/t IV narcotics. Will decrease IVFs for now. Await am labs to assess  WBC trend.   Patrici Ranks, NP-C [facility] [place]  pgr [phone]

## 2016-04-12 NOTE — Sedation Documentation (Signed)
Patient denies pain and is resting comfortably.  

## 2016-04-12 NOTE — Progress Notes (Signed)
Received call from [facility], that Dr. Earleen Newport has standing orders to give medication for possible post op rigor. Pt access by direct nurse and rapid respond nurse. Pt appear to to stable at this time. IR notified about pt's current condition and Dr. Earleen Newport is ok to hold medication and stated he will speak with hospitalist and to closely monitor patient. Patient is oriented and slightly drowsy, complaining of pain. Rectal temp 102. NS at 252ml/hr infusing for 3hrs,Tylenol 650mg  given. Will continue to monitor

## 2016-04-12 NOTE — Procedures (Signed)
Interventional Radiology Procedure Note  Procedure: Image guided right nephrostomy, with 6F pigtail into lower posterior calyx.  Sample sent to the lab for analysis.  Complications: None Recommendations:  - Record output daily - observe for urine clearing - follow up culture - Continue abx - If exchange is required, routine exchange can be initiated in ~6-8 weeks. - Plan for nephrolithiasis per Urology team - Routine drain care   Signed,  Dulcy Fanny. Earleen Newport, DO

## 2016-04-12 NOTE — Sedation Documentation (Signed)
Patient is resting comfortably. 

## 2016-04-12 NOTE — Progress Notes (Signed)
PROGRESS NOTE    Natalie [name]  [id number] DOB: 1951-06-18 DOA: 04/10/2016 PCP: Leandrew Koyanagi, MD  Brief Narrative: 65/F with DM, HTN, admitted with Sepsis due to R pyelonephritis, CT this am with hydronephrosis and 2.4cm stone. IR and Urology consulted, s/p Perc Nephrostomy today  Assessment & Plan:   Sepsis due to R pyelonephritis -with severe R hydronephrosis and 2.4 Cm stone at Renal pelvis -Urology Dr.Dahlstedt consulted, recommended IR consult to decompress R Kidney/Nephrostomy and Urology will consult for definitive management of stone -IR Dr.Wagner consulting, s/p Perc Nephrostomy today -continue IV ceftriaxone -some development of fluid overload too, cut down IVF -Urine Cx with Klebsiella  DM 2: hold metformin, SSI  H/o seizures -resumed Phenobarb   HTN (hypertension) -stable, hold ARB  AKI -due to sepsis, ARB,  -improving  Hypokalemia -replace  DVT prophylaxis:SCDs Code Status:Full Code Family Communication:None at bedside Disposition Plan: Home when improved 1-2days   Consultants:  Urology Dr.Dahlsted IR Dr.Wagner   Procedures:  Antimicrobials: Ceftriaxone  Subjective: Just back from procedure, feels drowsy  Objective: Filed Vitals:   04/12/16 1119 04/12/16 1211 04/12/16 1425 04/12/16 1508  BP: 155/69 130/69 128/60   Pulse: 104 101 113   Temp: 101.1 F (38.4 C) 100.2 F (37.9 C) 98.1 F (36.7 C) 100 F (37.8 C)  TempSrc: Rectal Oral Axillary Axillary  Resp: 14 15 16    Height:      Weight:      SpO2: 95% 95% 90%     Intake/Output Summary (Last 24 hours) at 04/12/16 1522 Last data filed at 04/12/16 0600  Gross per 24 hour  Intake   2680 ml  Output    975 ml  Net   1705 ml   Filed Weights   04/10/16 2144 04/11/16 0412 04/11/16 2021  Weight: 76.76 kg (169 lb 3.6 oz) 75.297 kg (166 lb) 79.924 kg (176 lb 3.2 oz)    Examination:  General exam: Appears calm but uncomfortable, no distress Respiratory system: diminished BS at  bases Cardiovascular system: S1 & S2 heard, RRR. No JVD, murmurs, rubs, gallops or clicks. No pedal edema. Gastrointestinal system: Abdomen soft, R CVA tenderness, BS present Central nervous system: Alert and oriented. No focal neurological deficits. Extremities: Symmetric 5 x 5 power. Skin: No rashes, lesions or ulcers Psychiatry: Judgement and insight appear normal. Mood & affect appropriate.     Data Reviewed: I have personally reviewed following labs and imaging studies  CBC:  Recent Labs Lab 04/10/16 1530 04/11/16 0119 04/12/16 0542  WBC 17.6* 11.8* 5.2  NEUTROABS 14.2*  --   --   HGB 9.8* 9.2* 7.6*  HCT 32.9* 31.3* 25.6*  MCV 87.0 87.7 86.8  PLT 294 292 [id number]   Basic Metabolic Panel:  Recent Labs Lab 04/10/16 1530 04/11/16 0119 04/12/16 0542  NA 135 137 137  K 4.7 3.7 3.1*  CL 102 105 112*  CO2 19* 19* 19*  GLUCOSE 245* 148* 180*  BUN 19 14 8   CREATININE 1.40* 1.31* 1.02*  CALCIUM 9.8 8.6* 7.4*   GFR: Estimated Creatinine Clearance: 46.6 mL/min (by C-G formula based on Cr of 1.02). Liver Function Tests:  Recent Labs Lab 04/10/16 1530  AST 19  ALT 13*  ALKPHOS 61  BILITOT 0.5  PROT 7.5  ALBUMIN 3.7   No results for input(s): LIPASE, AMYLASE in the last 168 hours. No results for input(s): AMMONIA in the last 168 hours. Coagulation Profile:  Recent Labs Lab 04/10/16 1923  INR 1.28   Cardiac Enzymes: No  results for input(s): CKTOTAL, CKMB, CKMBINDEX, TROPONINI in the last 168 hours. BNP (last 3 results) No results for input(s): PROBNP in the last 8760 hours. HbA1C:  Recent Labs  04/11/16 0119  HGBA1C 7.7*   CBG:  Recent Labs Lab 04/11/16 0848 04/11/16 1155 04/11/16 1736 04/11/16 2020 04/12/16 1132  GLUCAP 211* 137* 170* 161* 149*   Lipid Profile: No results for input(s): CHOL, HDL, LDLCALC, TRIG, CHOLHDL, LDLDIRECT in the last 72 hours. Thyroid Function Tests: No results for input(s): TSH, T4TOTAL, FREET4, T3FREE, THYROIDAB in  the last 72 hours. Anemia Panel: No results for input(s): VITAMINB12, FOLATE, FERRITIN, TIBC, IRON, RETICCTPCT in the last 72 hours. Urine analysis:    Component Value Date/Time   COLORURINE YELLOW 04/10/2016 1116   APPEARANCEUR TURBID* 04/10/2016 1116   LABSPEC 1.020 04/10/2016 1116   PHURINE 6.0 04/10/2016 1116   GLUCOSEU NEGATIVE 04/10/2016 1116   HGBUR LARGE* 04/10/2016 1116   Pen Mar 04/10/2016 1116   BILIRUBINUR neg 11/30/2014 1834   KETONESUR NEGATIVE 04/10/2016 1116   PROTEINUR 100* 04/10/2016 1116   PROTEINUR >=300 11/30/2014 1834   UROBILINOGEN 0.2 11/30/2014 1834   UROBILINOGEN 0.2 08/31/2014 2056   NITRITE POSITIVE* 04/10/2016 1116   NITRITE neg 11/30/2014 1834   LEUKOCYTESUR LARGE* 04/10/2016 1116   Sepsis Labs: @LABRCNTIP (procalcitonin:4,lacticidven:4)  ) Recent Results (from the past 240 hour(s))  Urine culture     Status: Abnormal   Collection Time: 04/10/16  3:41 PM  Result Value Ref Range Status   Specimen Description URINE, CLEAN CATCH  Final   Special Requests NONE  Final   Culture >=100,000 COLONIES/mL KLEBSIELLA PNEUMONIAE (A)  Final   Report Status 04/12/2016 FINAL  Final   Organism ID, Bacteria KLEBSIELLA PNEUMONIAE (A)  Final      Susceptibility   Klebsiella pneumoniae - MIC*    AMPICILLIN >=32 RESISTANT Resistant     CEFAZOLIN <=4 SENSITIVE Sensitive     CEFTRIAXONE <=1 SENSITIVE Sensitive     CIPROFLOXACIN <=0.25 SENSITIVE Sensitive     GENTAMICIN <=1 SENSITIVE Sensitive     IMIPENEM <=0.25 SENSITIVE Sensitive     NITROFURANTOIN 128 RESISTANT Resistant     TRIMETH/SULFA <=20 SENSITIVE Sensitive     AMPICILLIN/SULBACTAM 4 SENSITIVE Sensitive     PIP/TAZO <=4 SENSITIVE Sensitive     * >=100,000 COLONIES/mL KLEBSIELLA PNEUMONIAE  Blood Culture (routine x 2)     Status: None (Preliminary result)   Collection Time: 04/10/16  6:01 PM  Result Value Ref Range Status   Specimen Description BLOOD LEFT ANTECUBITAL  Final   Special  Requests BOTTLES DRAWN AEROBIC ONLY 5CC  Final   Culture NO GROWTH < 24 HOURS  Final   Report Status PENDING  Incomplete  Blood Culture (routine x 2)     Status: None (Preliminary result)   Collection Time: 04/10/16  6:08 PM  Result Value Ref Range Status   Specimen Description BLOOD LEFT ANTECUBITAL  Final   Special Requests BOTTLES DRAWN AEROBIC AND ANAEROBIC 5CC  Final   Culture NO GROWTH < 24 HOURS  Final   Report Status PENDING  Incomplete         Radiology Studies: Dg Chest Port 1 View  04/12/2016  CLINICAL DATA:  Oxygen desaturation EXAM: PORTABLE CHEST 1 VIEW COMPARISON:  04/10/2016 FINDINGS: Shallow inspiration. Increasing bilateral perihilar infiltration since previous study. This could be due to edema or bilateral pneumonia. Heart size is normal. No blunting of costophrenic angles. No pneumothorax. IMPRESSION: Developing bilateral perihilar infiltration could indicate edema  or pneumonia. Electronically Signed   By: Lucienne Capers M.D.   On: 04/12/2016 05:37   Dg Chest Port 1 View  04/10/2016  CLINICAL DATA:  Fevers for 1 day, initial encounter EXAM: PORTABLE CHEST 1 VIEW COMPARISON:  09/06/2014 FINDINGS: Cardiac shadow is mildly prominent accentuated by the portable technique. Patient rotation to the right is noted accentuating the mediastinal markings. The overall inspiratory effort is poor with crowding of the vascular markings although no focal confluent infiltrate is seen. IMPRESSION: Poor inspiratory effort.  No definitive infiltrate is noted. Electronically Signed   By: Inez Catalina M.D.   On: 04/10/2016 19:39   Ct Renal Stone Study  04/11/2016  CLINICAL DATA:  65 year old female with right flank pain and fever since yesterday. Initial encounter. EXAM: CT ABDOMEN AND PELVIS WITHOUT CONTRAST TECHNIQUE: Multidetector CT imaging of the abdomen and pelvis was performed following the standard protocol without IV contrast. COMPARISON:  Abdomen radiograph 09/01/2014. [facility] CT Abdomen and Pelvis 01/01/2011 FINDINGS: Mild cardiomegaly and elevation of the right hemidiaphragm are chronic. No pleural effusion. Mild atelectasis or scarring at both lung bases. Calcified coronary artery atherosclerosis. No acute osseous abnormality identified. Numerous bilateral posterior flank calcified injection granulomas and/or fat necrosis with confluent areas of subcutaneous scarring. Aortoiliac calcified atherosclerosis noted. Small volume pelvic free fluid. Mildly increased dystrophic calcifications in the uterus which might be fibroid related. Otherwise stable and negative noncontrast uterus and adnexa. Increased gaseous distension of the rectum. Sigmoid redundancy with mild to moderate diverticulosis, but no active inflammation. Mild diverticulosis in the left colon without inflammation. Retained stool in the transverse colon and right colon. A small volume of free fluid in the right lower quadrant obscures the appendix today. Decompressed terminal ileum. No dilated small bowel. Decompressed stomach. Moderate size gastric hiatal hernia has increased since 2012. Surgically absent gallbladder. Negative noncontrast liver, spleen, pancreas and adrenal glands. Left renal atrophy since 2012. Associated left perinephric stranding. Mild periureteral stranding. There is a large oval calculus at the left ureterovesical junction measuring 11 mm length by 8 mm diameter. There also appears to be a second similar 9-10 mm to upstream distal left ureter calculus (series 301, image 71). There are small superimposed pelvic phleboliths. On the right there is nephromegaly and moderate to severe hydronephrosis with a large, lobulated 16 x 24 x 24 mm (AP by transverse by CC) calculus at the right renal pelvis. Evidence of surrounding urothelial thickening. Additional right intra renal calculi up to 8 mm. Right hydroureter and mild to moderate periureteral stranding. Generalized mild to moderate right  para renal space stranding. No additional right ureteral calculus to the level of the ureterovesical junction. No other calculus within the urinary bladder. Low level retroperitoneal lymph nodes are increased since 2012, appear reactive. IMPRESSION: 1. Acute inflammation of the right kidney and ureter with moderate to severe right hydronephrosis and bulky 2.4 cm stone at the right renal pelvis. Probable underlying chronic urothelial inflammation. Pyelonephritis/pyonephrosis superimposed on obstruction should be considered, and there is reactive retroperitoneal lymphadenopathy as well as perinephric stranding and small volume abdominal and pelvic free fluid. 2. 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 3. Progressed moderate gastric hiatal hernia. 4. Calcified aortic atherosclerosis. Electronically Signed   By: Genevie Ann M.D.   On: 04/11/2016 08:11   Ir Nephrostomy Placement Right  04/12/2016  INDICATION: 65 year old female with a history of obstruction right kidney and admission  for sepsis. She presents for percutaneous nephrostomy EXAM: IR NEPHROSTOMY PLACEMENT RIGHT COMPARISON:  None. MEDICATIONS: Rocephin 1 gm IV; The antibiotic was administered in an appropriate time frame prior to skin puncture. ANESTHESIA/SEDATION: Fentanyl 100 mcg IV; Versed 1.0 mg IV Moderate Sedation Time:  30 The patient was continuously monitored during the procedure by the interventional radiology nurse under my direct supervision. CONTRAST:  Twenty - administered into the collecting system(s) FLUOROSCOPY TIME:  Fluoroscopy Time: 5 minutes 24 seconds (129 mGy). COMPLICATIONS: None PROCEDURE: Informed written consent was obtained from the patient and the patient's family after a thorough discussion of the procedural risks, benefits and alternatives. All questions were addressed. Maximal Sterile Barrier Technique was utilized including caps, mask, sterile  gowns, sterile gloves, sterile drape, hand hygiene and skin antiseptic. A timeout was performed prior to the initiation of the procedure. Patient positioned prone position on the fluoroscopy table. Ultrasound survey of the right flank was performed with images stored and sent to PACs. The patient was then prepped and draped in the usual sterile fashion. 1% lidocaine was used to anesthetize the skin and subcutaneous tissues for local anesthesia. Ultrasound guidance was used to access the collecting system, targeting stone in the pelvis of the kidney. With spontaneous urine returned, a subsequent contrast injection confirmed location within the collecting system. A double stick technique was then used to access a posterior inferior calyx, after injecting contrast and air. A Chiba needle was then used to access a posterior inferior calyx with fluoroscopic guidance. With spontaneous urine returned through the needle, passage of an 018 micro wire into the collecting system was performed under fluoroscopy. A small incision was made with an 11 blade scalpel, and the needle was removed from the wire. An Accustick system was then advanced over the wire into the collecting system under fluoroscopy. The metal stiffener and inner dilator were removed, and then a sample of fluid was aspirated through the 4 French outer sheath. Bentson wire was passed into the collecting system and the sheath removed. Ten French dilation of the soft tissues was performed. Using modified Seldinger technique, a 10 French pigtail catheter drain was placed over the Bentson wire. Wire and inner stiffener removed, and the pigtail was formed in the collecting system. Small amount of contrast confirmed position of the catheter. Patient tolerated the procedure well and remained hemodynamically stable throughout. No complications were encountered and no significant blood loss encountered IMPRESSION: Status post right percutaneous nephrostomy for obstructed  kidney. Signed, Dulcy Fanny. Earleen Newport, DO [facility] Radiology Electronically Signed   By: Corrie Mckusick D.O.   On: 04/12/2016 10:19        Scheduled Meds: . atorvastatin  20 mg Oral q1800  . cefTRIAXone (ROCEPHIN)  IV  1 g Intravenous Q24H  . clobetasol cream   Topical BID  . fluorometholone  1 drop Both Eyes BID  . insulin aspart  0-5 Units Subcutaneous QHS  . insulin aspart  0-9 Units Subcutaneous TID WC  . levothyroxine  75 mcg Oral QAC breakfast  . lidocaine      . meperidine (DEMEROL) injection  25 mg Intravenous STAT  . midazolam      . ondansetron (ZOFRAN) IV  4 mg Intravenous Once  . phenobarbital  194.4 mg Oral Once per day on Mon Wed Fri  . phenobarbital  97.2 mg Oral Once per day on Sun Tue Thu Sat   Continuous Infusions:     LOS: 2 days  Time spent: 26min    Domenic Polite, MD [facility] Pager [phone]  If 7PM-7AM, please contact night-coverage [email or web address] Password [facility] 04/12/2016, 3:22 PM

## 2016-04-13 LAB — BASIC METABOLIC PANEL
ANION GAP: 10 (ref 5–15)
BUN: 9 mg/dL (ref 6–20)
CALCIUM: 8 mg/dL — AB (ref 8.9–10.3)
CHLORIDE: 108 mmol/L (ref 101–111)
CO2: 20 mmol/L — AB (ref 22–32)
Creatinine, Ser: 1.07 mg/dL — ABNORMAL HIGH (ref 0.44–1.00)
GFR calc non Af Amer: 52 mL/min — ABNORMAL LOW (ref >60)
Glucose, Bld: 189 mg/dL — ABNORMAL HIGH (ref 65–99)
POTASSIUM: 3.9 mmol/L (ref 3.5–5.1)
Sodium: 138 mmol/L (ref 135–145)

## 2016-04-13 LAB — CBC
HEMATOCRIT: 26.6 % — AB (ref 36.0–46.0)
HEMOGLOBIN: 7.9 g/dL — AB (ref 12.0–15.0)
MCH: 25.6 pg — ABNORMAL LOW (ref 26.0–34.0)
MCHC: 29.7 g/dL — ABNORMAL LOW (ref 30.0–36.0)
MCV: 86.4 fL (ref 78.0–100.0)
Platelets: 176 10*3/uL (ref 150–400)
RBC: 3.08 MIL/uL — ABNORMAL LOW (ref 3.87–5.11)
RDW: 16 % — ABNORMAL HIGH (ref 11.5–15.5)
WBC: 8.9 10*3/uL (ref 4.0–10.5)

## 2016-04-13 LAB — GLUCOSE, CAPILLARY
GLUCOSE-CAPILLARY: 198 mg/dL — AB (ref 65–99)
GLUCOSE-CAPILLARY: 245 mg/dL — AB (ref 65–99)
Glucose-Capillary: 198 mg/dL — ABNORMAL HIGH (ref 65–99)
Glucose-Capillary: 225 mg/dL — ABNORMAL HIGH (ref 65–99)

## 2016-04-13 MED ORDER — DIPHENHYDRAMINE HCL 50 MG/ML IJ SOLN
INTRAMUSCULAR | Status: AC
  Filled 2016-04-13: qty 1

## 2016-04-13 MED ORDER — FUROSEMIDE 10 MG/ML IJ SOLN
20.0000 mg | Freq: Two times a day (BID) | INTRAMUSCULAR | Status: AC
  Administered 2016-04-13 (×2): 20 mg via INTRAVENOUS
  Filled 2016-04-13 (×2): qty 2

## 2016-04-13 MED ORDER — CEPHALEXIN 250 MG PO CAPS
250.0000 mg | ORAL_CAPSULE | Freq: Four times a day (QID) | ORAL | Status: DC
  Administered 2016-04-13 – 2016-04-14 (×4): 250 mg via ORAL
  Filled 2016-04-13 (×6): qty 1

## 2016-04-13 MED ORDER — HYDROCODONE-ACETAMINOPHEN 5-325 MG PO TABS
1.0000 | ORAL_TABLET | Freq: Four times a day (QID) | ORAL | Status: DC | PRN
  Administered 2016-04-13: 1 via ORAL
  Filled 2016-04-13 (×2): qty 1

## 2016-04-13 NOTE — Progress Notes (Signed)
PROGRESS NOTE    Natalie [name]  [id number] DOB: 1951-07-25 DOA: 04/10/2016 PCP: Leandrew Koyanagi, MD  Brief Narrative: 65/F with DM, HTN, admitted with Sepsis due to R pyelonephritis, CT this am with hydronephrosis and 2.4cm stone. IR and Urology consulted, s/p Perc Nephrostomy today  Assessment & Plan:   Sepsis due to R pyelonephritis -with severe R hydronephrosis and 2.4 Cm stone at Renal pelvis -Urology Dr.Dahlstedt consulted, recommended IR consult to decompress R Kidney/Nephrostomy and Urology will consult for definitive management of stone -IR Dr.Wagner consulting, s/p Perc Nephrostomy today -Day 3 of IV ceftriaxone, change to PO Keflex -Urine Cx with Klebsiella  Volume overload-iatrogenic -2-3L positive and crackles in lungs -lasix x2 doses today  Anemia -acute on chronic, due to dilution -monitor, no overt blood loss  DM 2: hold metformin, SSI  H/o seizures -resumed Phenobarb   HTN (hypertension) -stable, hold ARB  AKI -due to sepsis, ARB,  -improving  Hypokalemia -replace  DVT prophylaxis:SCDs Code Status:Full Code Family Communication: husband  at bedside Disposition Plan: Home when improved 1-2days   Consultants:  Urology Dr.Dahlsted IR Dr.Wagner   Procedures:  Antimicrobials: Ceftriaxone  Subjective: Just back from procedure, feels drowsy  Objective: Filed Vitals:   04/12/16 2116 04/13/16 0207 04/13/16 0447 04/13/16 0850  BP: 153/64 121/54 129/94 130/75  Pulse: 113 87 95 102  Temp: 100.6 F (38.1 C) 98.2 F (36.8 C) 98.3 F (36.8 C) 98.7 F (37.1 C)  TempSrc: Oral Oral Oral Oral  Resp: 20 19 20 18   Height:      Weight: 81.103 kg (178 lb 12.8 oz)     SpO2: 93% 93% 95% 93%    Intake/Output Summary (Last 24 hours) at 04/13/16 1347 Last data filed at 04/13/16 0900  Gross per 24 hour  Intake    480 ml  Output   1525 ml  Net  -1045 ml   Filed Weights   04/11/16 0412 04/11/16 2021 04/12/16 2116  Weight: 75.297 kg (166 lb)  79.924 kg (176 lb 3.2 oz) 81.103 kg (178 lb 12.8 oz)    Examination:  General exam: Appears calm but uncomfortable, no distress Respiratory system: crackles at bases Cardiovascular system: S1 & S2 heard, RRR. No JVD, murmurs, rubs, gallops or clicks. No pedal edema. Gastrointestinal system: Abdomen soft, R CVA tenderness, BS present Central nervous system: Alert and oriented. No focal neurological deficits. Extremities: Symmetric 5 x 5 power. Skin: No rashes, lesions or ulcers Psychiatry: Judgement and insight appear normal. Mood & affect appropriate.     Data Reviewed: I have personally reviewed following labs and imaging studies  CBC:  Recent Labs Lab 04/10/16 1530 04/11/16 0119 04/12/16 0542 04/13/16 0450  WBC 17.6* 11.8* 5.2 8.9  NEUTROABS 14.2*  --   --   --   HGB 9.8* 9.2* 7.6* 7.9*  HCT 32.9* 31.3* 25.6* 26.6*  MCV 87.0 87.7 86.8 86.4  PLT 294 292 167 0000000   Basic Metabolic Panel:  Recent Labs Lab 04/10/16 1530 04/11/16 0119 04/12/16 0542 04/13/16 0450  NA 135 137 137 138  K 4.7 3.7 3.1* 3.9  CL 102 105 112* 108  CO2 19* 19* 19* 20*  GLUCOSE 245* 148* 180* 189*  BUN 19 14 8 9   CREATININE 1.40* 1.31* 1.02* 1.07*  CALCIUM 9.8 8.6* 7.4* 8.0*   GFR: Estimated Creatinine Clearance: 44.8 mL/min (by C-G formula based on Cr of 1.07). Liver Function Tests:  Recent Labs Lab 04/10/16 1530  AST 19  ALT 13*  ALKPHOS 61  BILITOT 0.5  PROT 7.5  ALBUMIN 3.7   No results for input(s): LIPASE, AMYLASE in the last 168 hours. No results for input(s): AMMONIA in the last 168 hours. Coagulation Profile:  Recent Labs Lab 04/10/16 1923  INR 1.28   Cardiac Enzymes: No results for input(s): CKTOTAL, CKMB, CKMBINDEX, TROPONINI in the last 168 hours. BNP (last 3 results) No results for input(s): PROBNP in the last 8760 hours. HbA1C:  Recent Labs  04/11/16 0119  HGBA1C 7.7*   CBG:  Recent Labs Lab 04/12/16 1132 04/12/16 1659 04/12/16 2113  04/13/16 0738 04/13/16 1201  GLUCAP 149* 183* 233* 198* 198*   Lipid Profile: No results for input(s): CHOL, HDL, LDLCALC, TRIG, CHOLHDL, LDLDIRECT in the last 72 hours. Thyroid Function Tests: No results for input(s): TSH, T4TOTAL, FREET4, T3FREE, THYROIDAB in the last 72 hours. Anemia Panel: No results for input(s): VITAMINB12, FOLATE, FERRITIN, TIBC, IRON, RETICCTPCT in the last 72 hours. Urine analysis:    Component Value Date/Time   COLORURINE YELLOW 04/10/2016 1116   APPEARANCEUR TURBID* 04/10/2016 1116   LABSPEC 1.020 04/10/2016 1116   PHURINE 6.0 04/10/2016 1116   GLUCOSEU NEGATIVE 04/10/2016 1116   HGBUR LARGE* 04/10/2016 1116   [place] 04/10/2016 1116   BILIRUBINUR neg 11/30/2014 1834   KETONESUR NEGATIVE 04/10/2016 1116   PROTEINUR 100* 04/10/2016 1116   PROTEINUR >=300 11/30/2014 1834   UROBILINOGEN 0.2 11/30/2014 1834   UROBILINOGEN 0.2 08/31/2014 2056   NITRITE POSITIVE* 04/10/2016 1116   NITRITE neg 11/30/2014 1834   LEUKOCYTESUR LARGE* 04/10/2016 1116   Sepsis Labs: @LABRCNTIP (procalcitonin:4,lacticidven:4)  ) Recent Results (from the past 240 hour(s))  Urine culture     Status: Abnormal   Collection Time: 04/10/16  3:41 PM  Result Value Ref Range Status   Specimen Description URINE, CLEAN CATCH  Final   Special Requests NONE  Final   Culture >=100,000 COLONIES/mL KLEBSIELLA PNEUMONIAE (A)  Final   Report Status 04/12/2016 FINAL  Final   Organism ID, Bacteria KLEBSIELLA PNEUMONIAE (A)  Final      Susceptibility   Klebsiella pneumoniae - MIC*    AMPICILLIN >=32 RESISTANT Resistant     CEFAZOLIN <=4 SENSITIVE Sensitive     CEFTRIAXONE <=1 SENSITIVE Sensitive     CIPROFLOXACIN <=0.25 SENSITIVE Sensitive     GENTAMICIN <=1 SENSITIVE Sensitive     IMIPENEM <=0.25 SENSITIVE Sensitive     NITROFURANTOIN 128 RESISTANT Resistant     TRIMETH/SULFA <=20 SENSITIVE Sensitive     AMPICILLIN/SULBACTAM 4 SENSITIVE Sensitive     PIP/TAZO <=4  SENSITIVE Sensitive     * >=100,000 COLONIES/mL KLEBSIELLA PNEUMONIAE  Blood Culture (routine x 2)     Status: None (Preliminary result)   Collection Time: 04/10/16  6:01 PM  Result Value Ref Range Status   Specimen Description BLOOD LEFT ANTECUBITAL  Final   Special Requests BOTTLES DRAWN AEROBIC ONLY 5CC  Final   Culture NO GROWTH 2 DAYS  Final   Report Status PENDING  Incomplete  Blood Culture (routine x 2)     Status: None (Preliminary result)   Collection Time: 04/10/16  6:08 PM  Result Value Ref Range Status   Specimen Description BLOOD LEFT ANTECUBITAL  Final   Special Requests BOTTLES DRAWN AEROBIC AND ANAEROBIC 5CC  Final   Culture NO GROWTH 2 DAYS  Final   Report Status PENDING  Incomplete  Urine culture     Status: Abnormal (Preliminary result)   Collection Time: 04/12/16 10:03 AM  Result Value Ref Range Status  Specimen Description URINE, RANDOM RIGHT KIDNEY  Final   Special Requests NONE  Final   Culture 6,000 COLONIES/mL GRAM NEGATIVE RODS (A)  Final   Report Status PENDING  Incomplete         Radiology Studies: Dg Chest Port 1 View  04/12/2016  CLINICAL DATA:  Oxygen desaturation EXAM: PORTABLE CHEST 1 VIEW COMPARISON:  04/10/2016 FINDINGS: Shallow inspiration. Increasing bilateral perihilar infiltration since previous study. This could be due to edema or bilateral pneumonia. Heart size is normal. No blunting of costophrenic angles. No pneumothorax. IMPRESSION: Developing bilateral perihilar infiltration could indicate edema or pneumonia. Electronically Signed   By: Lucienne Capers M.D.   On: 04/12/2016 05:37   Ir Nephrostomy Placement Right  04/12/2016  INDICATION: 65 year old female with a history of obstruction right kidney and admission for sepsis. She presents for percutaneous nephrostomy EXAM: IR NEPHROSTOMY PLACEMENT RIGHT COMPARISON:  None. MEDICATIONS: Rocephin 1 gm IV; The antibiotic was administered in an appropriate time frame prior to skin puncture.  ANESTHESIA/SEDATION: Fentanyl 100 mcg IV; Versed 1.0 mg IV Moderate Sedation Time:  30 The patient was continuously monitored during the procedure by the interventional radiology nurse under my direct supervision. CONTRAST:  Twenty - administered into the collecting system(s) FLUOROSCOPY TIME:  Fluoroscopy Time: 5 minutes 24 seconds (129 mGy). COMPLICATIONS: None PROCEDURE: Informed written consent was obtained from the patient and the patient's family after a thorough discussion of the procedural risks, benefits and alternatives. All questions were addressed. Maximal Sterile Barrier Technique was utilized including caps, mask, sterile gowns, sterile gloves, sterile drape, hand hygiene and skin antiseptic. A timeout was performed prior to the initiation of the procedure. Patient positioned prone position on the fluoroscopy table. Ultrasound survey of the right flank was performed with images stored and sent to PACs. The patient was then prepped and draped in the usual sterile fashion. 1% lidocaine was used to anesthetize the skin and subcutaneous tissues for local anesthesia. Ultrasound guidance was used to access the collecting system, targeting stone in the pelvis of the kidney. With spontaneous urine returned, a subsequent contrast injection confirmed location within the collecting system. A double stick technique was then used to access a posterior inferior calyx, after injecting contrast and air. A Chiba needle was then used to access a posterior inferior calyx with fluoroscopic guidance. With spontaneous urine returned through the needle, passage of an 018 micro wire into the collecting system was performed under fluoroscopy. A small incision was made with an 11 blade scalpel, and the needle was removed from the wire. An Accustick system was then advanced over the wire into the collecting system under fluoroscopy. The metal stiffener and inner dilator were removed, and then a sample of fluid was aspirated  through the 4 French outer sheath. Bentson wire was passed into the collecting system and the sheath removed. Ten French dilation of the soft tissues was performed. Using modified Seldinger technique, a 10 French pigtail catheter drain was placed over the Bentson wire. Wire and inner stiffener removed, and the pigtail was formed in the collecting system. Small amount of contrast confirmed position of the catheter. Patient tolerated the procedure well and remained hemodynamically stable throughout. No complications were encountered and no significant blood loss encountered IMPRESSION: Status post right percutaneous nephrostomy for obstructed kidney. Signed, Dulcy Fanny. Earleen Newport, DO [facility] Radiology Electronically Signed   By: Corrie Mckusick D.O.   On: 04/12/2016 10:19        Scheduled Meds: .  atorvastatin  20 mg Oral q1800  . cephALEXin  250 mg Oral Q6H  . clobetasol cream   Topical BID  . fluorometholone  1 drop Both Eyes BID  . furosemide  20 mg Intravenous Q12H  . insulin aspart  0-5 Units Subcutaneous QHS  . insulin aspart  0-9 Units Subcutaneous TID WC  . levothyroxine  75 mcg Oral QAC breakfast  . ondansetron (ZOFRAN) IV  4 mg Intravenous Once  . phenobarbital  194.4 mg Oral Once per day on Mon Wed Fri  . phenobarbital  97.2 mg Oral Once per day on Sun Tue Thu Sat   Continuous Infusions:     LOS: 3 days    Time spent: 83min    Domenic Polite, MD [facility] Pager [phone]  If 7PM-7AM, please contact night-coverage [email or web address] Password [email or web address] 04/13/2016, 1:47 PM

## 2016-04-13 NOTE — Progress Notes (Signed)
Referring Physician(s): Dr Preston Fleeting  Supervising Physician: Corrie Mckusick  Patient Status:  Inpatient  Chief Complaint:  R pyelonephritis R Percutaneous nephrostomy placed 7/13 in IR  Subjective:  Up in chair Feels better Passing some urine Complains of pain at site of PCN  Allergies: Compazine; Cymbalta; Escitalopram oxalate; Methadone; and Nitrofuran derivatives  Medications: Prior to Admission medications   Medication Sig Start Date End Date Taking? Authorizing Provider  atorvastatin (LIPITOR) 20 MG tablet Take 1 tablet (20 mg total) by mouth daily. 01/04/16  Yes Leandrew Koyanagi, MD  butorphanol (STADOL) 10 MG/ML nasal spray Place 1 spray into the nose every 4 (four) hours as needed for migraine. May refill weekly. To follow last prescription of 02/29/16. Awaiting transfer to Pain Management program at [facility] 04/02/16  Yes Leandrew Koyanagi, MD  clobetasol cream (TEMOVATE) 0.05 % Apply topically 2 (two) times daily. 01/21/16  Yes Leandrew Koyanagi, MD  ESTRACE VAGINAL 0.1 MG/GM vaginal cream insert 1/4 gram vaginally two times a week 12/03/15  Yes Historical Provider, MD  fluorometholone (FML) 0.1 % ophthalmic suspension USE 1 DROP INTO BOTH EYES 4 TIMES A DAY FOR 7 DAYS 03/28/16  Yes Historical Provider, MD  levothyroxine (SYNTHROID, LEVOTHROID) 75 MCG tablet Take 1 tablet (75 mcg total) by mouth daily. 01/04/16  Yes Leandrew Koyanagi, MD  losartan (COZAAR) 50 MG tablet Take 1 tablet (50 mg total) by mouth daily. 01/04/16  Yes Leandrew Koyanagi, MD  metFORMIN (GLUCOPHAGE) 1000 MG tablet Take 1 tablet (1,000 mg total) by mouth 2 (two) times daily with a meal. 01/04/16  Yes Leandrew Koyanagi, MD  ondansetron (ZOFRAN-ODT) 8 MG disintegrating tablet DISSOLVE 1 TABLET ON OR UNDER THE TONGUE EVERY 8 HOURS AS NEEDED FOR NAUSEA 12/29/15  Yes Leandrew Koyanagi, MD  PHENobarbital (LUMINAL) 97.2 MG tablet TAKE TWO TABLETS BY MOUTH ONCE DAILY ON  MON,  WED,  AND  FRI  AND  1  TABLET   DAILY  ON  OTHER  DAYS 01/04/16  Yes Leandrew Koyanagi, MD  terconazole (TERAZOL 3) 0.8 % vaginal cream Place 1 applicator vaginally at bedtime.  12/05/15  Yes Historical Provider, MD  zolpidem (AMBIEN) 10 MG tablet take 1 tablet by mouth at bedtime for sleep if needed 04/02/16  Yes Leandrew Koyanagi, MD     Vital Signs: BP 130/75 mmHg  Pulse 102  Temp(Src) 98.7 F (37.1 C) (Oral)  Resp 18  Ht 4\' 9"  (1.448 m)  Wt 178 lb 12.8 oz (81.103 kg)  BMI 38.68 kg/m2  SpO2 93%  Physical Exam  Abdominal: Soft.  Musculoskeletal: Normal range of motion.  Neurological: She is alert.  Skin: Skin is warm and dry.  Site is clean and dry NT at skin site Definitely tender superior to PCN site No bleeding  no hematoma  Output 400 cc just now: blood tinged More yellow today per pt. 850 cc total today per chart   Psychiatric: She has a normal mood and affect. Her behavior is normal.  Nursing note and vitals reviewed.   Imaging: Dg Chest Port 1 View  04/12/2016  CLINICAL DATA:  Oxygen desaturation EXAM: PORTABLE CHEST 1 VIEW COMPARISON:  04/10/2016 FINDINGS: Shallow inspiration. Increasing bilateral perihilar infiltration since previous study. This could be due to edema or bilateral pneumonia. Heart size is normal. No blunting of costophrenic angles. No pneumothorax. IMPRESSION: Developing bilateral perihilar infiltration could indicate edema or pneumonia. Electronically Signed   By: Oren Beckmann.D.  On: 04/12/2016 05:37   Dg Chest Port 1 View  04/10/2016  CLINICAL DATA:  Fevers for 1 day, initial encounter EXAM: PORTABLE CHEST 1 VIEW COMPARISON:  09/06/2014 FINDINGS: Cardiac shadow is mildly prominent accentuated by the portable technique. Patient rotation to the right is noted accentuating the mediastinal markings. The overall inspiratory effort is poor with crowding of the vascular markings although no focal confluent infiltrate is seen. IMPRESSION: Poor inspiratory effort.  No definitive  infiltrate is noted. Electronically Signed   By: Inez Catalina M.D.   On: 04/10/2016 19:39   Ct Renal Stone Study  04/11/2016  CLINICAL DATA:  65 year old female with right flank pain and fever since yesterday. Initial encounter. EXAM: CT ABDOMEN AND PELVIS WITHOUT CONTRAST TECHNIQUE: Multidetector CT imaging of the abdomen and pelvis was performed following the standard protocol without IV contrast. COMPARISON:  Abdomen radiograph 09/01/2014. [facility] CT Abdomen and Pelvis 01/01/2011 FINDINGS: Mild cardiomegaly and elevation of the right hemidiaphragm are chronic. No pleural effusion. Mild atelectasis or scarring at both lung bases. Calcified coronary artery atherosclerosis. No acute osseous abnormality identified. Numerous bilateral posterior flank calcified injection granulomas and/or fat necrosis with confluent areas of subcutaneous scarring. Aortoiliac calcified atherosclerosis noted. Small volume pelvic free fluid. Mildly increased dystrophic calcifications in the uterus which might be fibroid related. Otherwise stable and negative noncontrast uterus and adnexa. Increased gaseous distension of the rectum. Sigmoid redundancy with mild to moderate diverticulosis, but no active inflammation. Mild diverticulosis in the left colon without inflammation. Retained stool in the transverse colon and right colon. A small volume of free fluid in the right lower quadrant obscures the appendix today. Decompressed terminal ileum. No dilated small bowel. Decompressed stomach. Moderate size gastric hiatal hernia has increased since 2012. Surgically absent gallbladder. Negative noncontrast liver, spleen, pancreas and adrenal glands. Left renal atrophy since 2012. Associated left perinephric stranding. Mild periureteral stranding. There is a large oval calculus at the left ureterovesical junction measuring 11 mm length by 8 mm diameter. There also appears to be a second similar 9-10 mm to upstream distal  left ureter calculus (series 301, image 71). There are small superimposed pelvic phleboliths. On the right there is nephromegaly and moderate to severe hydronephrosis with a large, lobulated 16 x 24 x 24 mm (AP by transverse by CC) calculus at the right renal pelvis. Evidence of surrounding urothelial thickening. Additional right intra renal calculi up to 8 mm. Right hydroureter and mild to moderate periureteral stranding. Generalized mild to moderate right para renal space stranding. No additional right ureteral calculus to the level of the ureterovesical junction. No other calculus within the urinary bladder. Low level retroperitoneal lymph nodes are increased since 2012, appear reactive. IMPRESSION: 1. Acute inflammation of the right kidney and ureter with moderate to severe right hydronephrosis and bulky 2.4 cm stone at the right renal pelvis. Probable underlying chronic urothelial inflammation. Pyelonephritis/pyonephrosis superimposed on obstruction should be considered, and there is reactive retroperitoneal lymphadenopathy as well as perinephric stranding and small volume abdominal and pelvic free fluid. 2. 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 3. Progressed moderate gastric hiatal hernia. 4. Calcified aortic atherosclerosis. Electronically Signed   By: Genevie Ann M.D.   On: 04/11/2016 08:11   Ir Nephrostomy Placement Right  04/12/2016  INDICATION: 65 year old female with a history of obstruction right kidney and admission for sepsis. She presents for percutaneous nephrostomy EXAM: IR NEPHROSTOMY PLACEMENT RIGHT COMPARISON:  None. MEDICATIONS: Rocephin 1 gm IV; The antibiotic was administered in an appropriate time frame prior to skin puncture. ANESTHESIA/SEDATION: Fentanyl 100 mcg IV; Versed 1.0 mg IV Moderate Sedation Time:  30 The patient was continuously monitored during the procedure by the interventional radiology  nurse under my direct supervision. CONTRAST:  Twenty - administered into the collecting system(s) FLUOROSCOPY TIME:  Fluoroscopy Time: 5 minutes 24 seconds (129 mGy). COMPLICATIONS: None PROCEDURE: Informed written consent was obtained from the patient and the patient's family after a thorough discussion of the procedural risks, benefits and alternatives. All questions were addressed. Maximal Sterile Barrier Technique was utilized including caps, mask, sterile gowns, sterile gloves, sterile drape, hand hygiene and skin antiseptic. A timeout was performed prior to the initiation of the procedure. Patient positioned prone position on the fluoroscopy table. Ultrasound survey of the right flank was performed with images stored and sent to PACs. The patient was then prepped and draped in the usual sterile fashion. 1% lidocaine was used to anesthetize the skin and subcutaneous tissues for local anesthesia. Ultrasound guidance was used to access the collecting system, targeting stone in the pelvis of the kidney. With spontaneous urine returned, a subsequent contrast injection confirmed location within the collecting system. A double stick technique was then used to access a posterior inferior calyx, after injecting contrast and air. A Chiba needle was then used to access a posterior inferior calyx with fluoroscopic guidance. With spontaneous urine returned through the needle, passage of an 018 micro wire into the collecting system was performed under fluoroscopy. A small incision was made with an 11 blade scalpel, and the needle was removed from the wire. An Accustick system was then advanced over the wire into the collecting system under fluoroscopy. The metal stiffener and inner dilator were removed, and then a sample of fluid was aspirated through the 4 French outer sheath. Bentson wire was passed into the collecting system and the sheath removed. Ten French dilation of the soft tissues was performed. Using modified  Seldinger technique, a 10 French pigtail catheter drain was placed over the Bentson wire. Wire and inner stiffener removed, and the pigtail was formed in the collecting system. Small amount of contrast confirmed position of the catheter. Patient tolerated the procedure well and remained hemodynamically stable throughout. No complications were encountered and no significant blood loss encountered IMPRESSION: Status post right percutaneous nephrostomy for obstructed kidney. Signed, Dulcy Fanny. Earleen Newport, DO [facility] Electronically Signed   By: Corrie Mckusick D.O.   On: 04/12/2016 10:19    Labs:  CBC:  Recent Labs  04/10/16 1530 04/11/16 0119 04/12/16 0542 04/13/16 0450  WBC 17.6* 11.8* 5.2 8.9  HGB 9.8* 9.2* 7.6* 7.9*  HCT 32.9* 31.3* 25.6* 26.6*  PLT 294 292 167 176    COAGS:  Recent Labs  04/10/16 1923  INR 1.28  APTT 34    BMP:  Recent Labs  04/10/16 1530 04/11/16 0119 04/12/16 0542 04/13/16 0450  NA 135 137 137 138  K 4.7 3.7 3.1* 3.9  CL 102 105 112* 108  CO2 19* 19* 19* 20*  GLUCOSE 245* 148* 180* 189*  BUN 19 14 8 9   CALCIUM 9.8 8.6* 7.4* 8.0*  CREATININE 1.40* 1.31* 1.02* 1.07*  GFRNONAA 38* 41* 56* 52*  GFRAA 44* 48* >60 >60    LIVER FUNCTION TESTS:  Recent Labs  08/31/15 1119 01/04/16 1430 04/10/16 1530  BILITOT 0.2 0.2 0.5  AST 11 12 19   ALT 9  10 13*  ALKPHOS 59 65 61  PROT 6.9 7.1 7.5  ALBUMIN 3.9 4.1 3.7    Assessment and Plan:  R nephrolithiasis Pyelonephritis R PCN placed 7/13 Doing well Plan per Dr Diona Fanti  Electronically Signed: Monia Sabal A 04/13/2016, 10:55 AM   I spent a total of 15 Minutes at the the patient's bedside AND on the patient's hospital floor or unit, greater than 50% of which was counseling/coordinating care for R PCN

## 2016-04-13 NOTE — Care Management Important Message (Signed)
Important Message  Patient Details  Name: Natalie [name] MRN: [id number] Date of Birth: 1951/06/27   Medicare Important Message Given:  Yes    Nathen May 04/13/2016, 11:42 AM

## 2016-04-14 DIAGNOSIS — N12 Tubulo-interstitial nephritis, not specified as acute or chronic: Secondary | ICD-10-CM

## 2016-04-14 LAB — CBC
HEMATOCRIT: 25.4 % — AB (ref 36.0–46.0)
Hemoglobin: 7.5 g/dL — ABNORMAL LOW (ref 12.0–15.0)
MCH: 25.5 pg — AB (ref 26.0–34.0)
MCHC: 29.5 g/dL — ABNORMAL LOW (ref 30.0–36.0)
MCV: 86.4 fL (ref 78.0–100.0)
Platelets: 179 10*3/uL (ref 150–400)
RBC: 2.94 MIL/uL — AB (ref 3.87–5.11)
RDW: 16.1 % — ABNORMAL HIGH (ref 11.5–15.5)
WBC: 5.7 10*3/uL (ref 4.0–10.5)

## 2016-04-14 LAB — BASIC METABOLIC PANEL
ANION GAP: 10 (ref 5–15)
BUN: 8 mg/dL (ref 6–20)
CO2: 25 mmol/L (ref 22–32)
Calcium: 8.5 mg/dL — ABNORMAL LOW (ref 8.9–10.3)
Chloride: 105 mmol/L (ref 101–111)
Creatinine, Ser: 1.17 mg/dL — ABNORMAL HIGH (ref 0.44–1.00)
GFR calc Af Amer: 55 mL/min — ABNORMAL LOW (ref >60)
GFR calc non Af Amer: 47 mL/min — ABNORMAL LOW (ref >60)
GLUCOSE: 166 mg/dL — AB (ref 65–99)
POTASSIUM: 3.4 mmol/L — AB (ref 3.5–5.1)
Sodium: 140 mmol/L (ref 135–145)

## 2016-04-14 LAB — URINE CULTURE: Culture: 6000 — AB

## 2016-04-14 LAB — GLUCOSE, CAPILLARY: Glucose-Capillary: 170 mg/dL — ABNORMAL HIGH (ref 65–99)

## 2016-04-14 MED ORDER — HYDROCODONE-ACETAMINOPHEN 5-325 MG PO TABS: 1.0000 | ORAL_TABLET | Freq: Four times a day (QID) | ORAL | Status: DC | PRN

## 2016-04-14 MED ORDER — CEPHALEXIN 250 MG PO CAPS: 250.0000 mg | ORAL_CAPSULE | Freq: Four times a day (QID) | ORAL | Status: DC

## 2016-04-14 NOTE — Progress Notes (Signed)
Patient discharged to home with husband. Patient left in stable condition. Discharge instructions reviewed. Scripts given to patient. Patient left floor in wheelchair with all belongings.

## 2016-04-15 LAB — CULTURE, BLOOD (ROUTINE X 2)
Culture: NO GROWTH
Culture: NO GROWTH

## 2016-04-16 ENCOUNTER — Ambulatory Visit (INDEPENDENT_AMBULATORY_CARE_PROVIDER_SITE_OTHER): Payer: Medicare Other | Admitting: Family Medicine

## 2016-04-16 ENCOUNTER — Encounter: Payer: Self-pay | Admitting: Physician Assistant

## 2016-04-16 VITALS — BP 140/80 | HR 97 | Temp 98.8°F | Resp 18 | Ht <= 58 in | Wt 155.0 lb

## 2016-04-16 DIAGNOSIS — T8383XA Hemorrhage of genitourinary prosthetic devices, implants and grafts, initial encounter: Secondary | ICD-10-CM | POA: Diagnosis not present

## 2016-04-16 DIAGNOSIS — E1129 Type 2 diabetes mellitus with other diabetic kidney complication: Secondary | ICD-10-CM | POA: Diagnosis not present

## 2016-04-16 DIAGNOSIS — N179 Acute kidney failure, unspecified: Secondary | ICD-10-CM | POA: Diagnosis not present

## 2016-04-16 DIAGNOSIS — R310 Gross hematuria: Secondary | ICD-10-CM

## 2016-04-16 DIAGNOSIS — D509 Iron deficiency anemia, unspecified: Secondary | ICD-10-CM

## 2016-04-16 DIAGNOSIS — N12 Tubulo-interstitial nephritis, not specified as acute or chronic: Secondary | ICD-10-CM

## 2016-04-16 LAB — POCT CBC
GRANULOCYTE PERCENT: 73 % (ref 37–80)
HCT, POC: 29.4 % — AB (ref 37.7–47.9)
HEMOGLOBIN: 9.6 g/dL — AB (ref 12.2–16.2)
Lymph, poc: 2.3 (ref 0.6–3.4)
MCH: 26.4 pg — AB (ref 27–31.2)
MCHC: 32.7 g/dL (ref 31.8–35.4)
MCV: 80.7 fL (ref 80–97)
MID (CBC): 0.3 (ref 0–0.9)
MPV: 8.2 fL (ref 0–99.8)
PLATELET COUNT, POC: 357 10*3/uL (ref 142–424)
POC Granulocyte: 7.1 — AB (ref 2–6.9)
POC LYMPH PERCENT: 23.7 %L (ref 10–50)
POC MID %: 3.3 %M (ref 0–12)
RBC: 3.64 M/uL — AB (ref 4.04–5.48)
RDW, POC: 17.6 %
WBC: 9.7 10*3/uL (ref 4.6–10.2)

## 2016-04-16 NOTE — Progress Notes (Deleted)
Subjective:  By signing my name below, I, Natalie [name], attest that this documentation has been prepared under the direction and in the presence of Natalie Cheadle, MD. Electronically Signed: Moises [name], [place]. 04/16/2016 , 3:32 PM .  Patient was seen in Room 3 .   Patient ID: Natalie [name], female    DOB: Mar 04, 1951, 65 y.o.   MRN: [id number] Chief Complaint  Patient presents with   Referral    Urology    HPI Natalie [name] is a 65 y.o. female who presents to [facility] requesting referral to urology. She was seen at the ER on 04/10/16 with complaints of fever and back pain. They placed her with an urinary catheter and requires a referral to urology. She mentions seeing [facility] about 18 months for kidney stone.   Patient still reports having intermittent nausea and draining fatigue. She notes being off metformin while in the hospital but is now back on it. She's still taking her iron supplements. She's noticed hematuria in her bag today.   Past Medical History  Diagnosis Date   Hyperlipidemia    Hypertension    Diabetes mellitus without complication ([place])    Prior to Admission medications   Medication Sig Start Date End Date Taking? Authorizing Provider  atorvastatin (LIPITOR) 20 MG tablet Take 1 tablet (20 mg total) by mouth daily. 01/04/16   Leandrew Koyanagi, MD  butorphanol (STADOL) 10 MG/ML nasal spray Place 1 spray into the nose every 4 (four) hours as needed for migraine. May refill weekly. To follow last prescription of 02/29/16. Awaiting transfer to Pain Management program at [facility]. 04/02/16   Leandrew Koyanagi, MD  cephALEXin (KEFLEX) 250 MG capsule Take 1 capsule (250 mg total) by mouth every 6 (six) hours. For 5days 04/14/16   Domenic Polite, MD  clobetasol cream (TEMOVATE) 0.05 % Apply topically 2 (two) times daily. 01/21/16   Leandrew Koyanagi, MD  ESTRACE VAGINAL 0.1 MG/GM vaginal cream insert 1/4 gram vaginally two times a week 12/03/15   Historical Provider, MD    fluorometholone (FML) 0.1 % ophthalmic suspension USE 1 DROP INTO BOTH EYES 4 TIMES A DAY FOR 7 DAYS 03/28/16   Historical Provider, MD  HYDROcodone-acetaminophen (NORCO/VICODIN) 5-325 MG tablet Take 1 tablet by mouth every 6 (six) hours as needed for moderate pain or severe pain. 04/14/16   Domenic Polite, MD  levothyroxine (SYNTHROID, LEVOTHROID) 75 MCG tablet Take 1 tablet (75 mcg total) by mouth daily. 01/04/16   Leandrew Koyanagi, MD  losartan (COZAAR) 50 MG tablet Take 1 tablet (50 mg total) by mouth daily. 01/04/16   Leandrew Koyanagi, MD  metFORMIN (GLUCOPHAGE) 1000 MG tablet Take 1 tablet (1,000 mg total) by mouth 2 (two) times daily with a meal. 01/04/16   Leandrew Koyanagi, MD  ondansetron (ZOFRAN-ODT) 8 MG disintegrating tablet DISSOLVE 1 TABLET ON OR UNDER THE TONGUE EVERY 8 HOURS AS NEEDED FOR NAUSEA 12/29/15   Leandrew Koyanagi, MD  PHENobarbital (LUMINAL) 97.2 MG tablet TAKE TWO TABLETS BY MOUTH ONCE DAILY ON  MON,  WED,  AND  FRI  AND  1  TABLET  DAILY  ON  OTHER  DAYS 01/04/16   Leandrew Koyanagi, MD  terconazole (TERAZOL 3) 0.8 % vaginal cream Place 1 applicator vaginally at bedtime.  12/05/15   Historical Provider, MD  zolpidem (AMBIEN) 10 MG tablet take 1 tablet by mouth at bedtime for sleep if needed 04/02/16   Leandrew Koyanagi, MD   Allergies  Allergen Reactions   Compazine     Bad headache, vomiting    Cymbalta [Duloxetine Hcl]     unknown   Escitalopram Oxalate Other (See Comments)    Can't sleep   Methadone Hives   Nitrofuran Derivatives     unknown    Review of Systems  Constitutional: Positive for fatigue. Negative for unexpected weight change.  Respiratory: Negative for chest tightness and shortness of breath.   Cardiovascular: Negative for chest pain, palpitations and leg swelling.  Gastrointestinal: Positive for nausea. Negative for abdominal pain and [name] in stool.  Genitourinary: Positive for hematuria.  Neurological: Negative for dizziness, syncope,  light-headedness and headaches.       Objective:   Physical Exam  Constitutional: She is oriented to person, place, and time. She appears well-developed and well-nourished. No distress.  HENT:  Head: Normocephalic and atraumatic.  Eyes: EOM are normal. Pupils are equal, round, and reactive to light.  Neck: Neck supple.  Cardiovascular: Normal rate.   Pulmonary/Chest: Effort normal. No respiratory distress.  Musculoskeletal: Normal range of motion.  Neurological: She is alert and oriented to person, place, and time.  Skin: Skin is warm and dry.  Psychiatric: She has a normal mood and affect. Her behavior is normal.  Nursing note and vitals reviewed.   BP 140/80 mmHg   Pulse 97   Temp(Src) 98.8 F (37.1 C) (Oral)   Resp 18   Ht 4\' 9"  (1.448 m)   Wt 155 lb (70.308 kg)   BMI 33.53 kg/m2   SpO2 96%   Results for orders placed or performed in visit on 04/16/16  POCT CBC  Result Value Ref Range   WBC 9.7 4.6 - 10.2 K/uL   Lymph, poc 2.3 0.6 - 3.4   POC LYMPH PERCENT 23.7 10 - 50 %L   MID (cbc) 0.3 0 - 0.9   POC MID % 3.3 0 - 12 %M   POC Granulocyte 7.1 (A) 2 - 6.9   Granulocyte percent 73.0 37 - 80 %G   RBC 3.64 (A) 4.04 - 5.48 M/uL   Hemoglobin 9.6 (A) 12.2 - 16.2 g/dL   HCT, POC 29.4 (A) 37.7 - 47.9 %   MCV 80.7 80 - 97 fL   MCH, POC 26.4 (A) 27 - 31.2 pg   MCHC 32.7 31.8 - 35.4 g/dL   RDW, POC 17.6 %   Platelet Count, POC 357 142 - 424 K/uL   MPV 8.2 0 - 99.8 fL      Assessment & Plan:   1. Pyelonephritis - Ambulatory referral to Urology - BASIC METABOLIC PANEL WITH GFR - POCT CBC  2. AKI (acute kidney injury) ([place]) - Ambulatory referral to Urology - BASIC METABOLIC PANEL WITH GFR  3. Nephrostomy tube bleed ([place]) - Ambulatory referral to Urology - BASIC METABOLIC PANEL WITH GFR  4. Iron deficiency anemia - POCT CBC  5. Gross hematuria  6. Type 2 diabetes mellitus with other diabetic kidney complication, without long-term current use of insulin  (HCC)   Patient's hemoglobin has improved since two days ago (7.5-->9.6). Informed that Urology referral has been sent. Pt to return to clinic Friday if has not seen Urology by this point to follow up on labs as she will have completed course of Keflex at this time.

## 2016-04-16 NOTE — Patient Instructions (Addendum)
It will take at least a day or two to process your referral to urology today and get an appointment scheduled.  They will call you as as soon as they authorize the appointment.  If you have not heard from [facility] in 48 hours (on Wed) please call us at [facility] and speak to our referral department to find out where the referral is in the process.   IF you received an x-ray today, you will receive an invoice from [facility]. Please contact [facility] at [phone] with questions or concerns regarding your invoice.   IF you received labwork today, you will receive an invoice from [employer]. Please contact [facility] at [phone] with questions or concerns regarding your invoice.   Our billing staff will not be able to assist you with questions regarding bills from these companies.  You will be contacted with the lab results as soon as they are available. The fastest way to get your results is to activate your My Chart account. Instructions are located on the last page of this paperwork. If you have not heard from [place] regarding the results in 2 weeks, please contact this office.     Percutaneous Nephrostomy Home Guide A nephrostomy tube allows urine to leave your body when a medical condition prevents it from leaving your kidney normally. Urine is normally carried from the kidneys to the bladder through narrow tubes called ureters. The ureter can become obstructed due to conditions such as kidney stones, tumors, infection, or blood clots. A nephrostomy tube is a hollow, flexible tube placed into the kidney to restore the flow of urine. The tube is placed on the right or left side of your lower back and is connected to an external drainage bag. PERSONAL HYGIENE  You may shower unless otherwise told by your health care provider. Prepare for a shower by placing a plastic covering over the nephrostomy tube dressing.  Change the dressing immediately  after showering. Make sure your skin around the nephrostomy tube exit site is dry.  Avoid immersing your nephrostomy tube in water, such as taking a bath or swimming. NEPHROSTOMY TUBE CARE  Your nephrostomy tube is connected to a leg bag or bedside drainage bag. Always keep your tubing, leg bag, or bedside drainage bag below the level of your kidney so that your urine drains freely.  Your activity is not restricted as long as your activity does not pull or tug on your tube.  During the day, if you are connecting your nephrostomy tube to a leg bag, ensure that your tubing does not have any kinks and that your urine is draining freely. One helpful technique to prevent kinking or inadvertent dislodging of your nephrostomy tubing is to gently wrap an elastic bandage over the tubing. This will help to secure the tubing in place. Make sure there is no tension on the tubing so it does not become dislodged.  At night, you may want to connect your nephrostomy tube to a larger bedside drainage bag. EMPTYING THE LEG BAG OR BEDSIDE DRAINAGE BAG  The leg bag or bedside drainage bag should be emptied when it becomes  full and before going to sleep. Most leg bags and bedside drainage bags have a drain at the bottom that allows urine to be emptied.  Hold the leg bag or bedside drainage bag over a toilet or collection container. Use a measuring container if you are directed to measure your urine.  Open the drain and allow the  urine to drain.  Once all the urine is drained from the leg bag or bedside drainage bag, close the drain fully to avoid urine leakage.  Flush urine down toilet. If a collection container was used, rinse the container. NEPHROSTOMY TUBE EXIT SITE CARE The exit site for your nephrostomy tube is covered with a bandage (dressing). Clean your exit site and change your dressing as directed by your health care provider or if your dressing becomes wet. Supplies Needed:  Mild soap and  water.  44 inch (10x10 cm) split gauze pads.  44 inch (10x10 cm) gauze pads.  Paper tape. Exit Site Care and Dressing Change:For the first 2 weeks after having a nephrostomy tube inserted, you should change the dressing every day. After 2 weeks, you can change the dressing 2 times per week, whenever the dressing becomes wet, or as told by your health care provider. Because of the location of your nephrostomy tube, you may need help from another person to complete dressing changes. The steps to changing a dressing are: 1. Wash hands well with soap and water. 2. Gently remove the tapes and dressing from around the nephrostomy tube. Be careful not to pull on the tube while removing the dressing. Avoid using scissors to remove the dressing since this may lead to accidental damage to the tube. 3. Wash the skin around the tube with the mild soap and water, rinse well, and dry with a clean cloth. 4. Inspect the skin around the drain for redness, swelling, and foul-smelling yellow or green discharge. 5. If the drain was sutured to the skin, inspect the suture to verify that it is still anchored in the skin. 6. Place two split gauze pads in and around the tube exit site. Do not apply ointments or alcohol to the site. 7. Place a gauze pad on top of the split gauze pad. 8. Coil the tube on top of the gauze. The tubing should rest on the gauze, not on the skin. 9. Place tape around each edge of the gauze pad. 10. Secure the nephrostomy tubing. Ensure that the nephrostomy tube does not kink or become pinched. The tubing should rest on the gauze pad and not on the skin. 11. Dispose of used supplies properly. FLUSHING YOUR NEPHROSTOMY TUBE  Flush your nephrostomy tube as directed by your health care provider. Flushing of a nephrostomy tube is easier if a three-way stopcock is placed between the tube and the leg bag or bedside drainage bag. One connection of the three-way stopcock connects to your tube, the  second connects to the leg bag or bedside drainage bag, and the third connection is usually covered with a cap. The three-way stopcock lever points to the direction on the stopcock that is closed to flow. Normally, the lever points in the direction of the cap to allow urine to drain from the tube to the leg bag or bedside drainage bag. Supplies Needed:  Rubbing alcohol wipe.  10 mL 0.9% saline syringe. Flushing Your Nephrostomy Tube 1. Gather needed supplies. 2. Move the lever of the three-way stopcock so that it points toward the leg bag or bedside drainage bag. 3. Clean the cap with a rubbing alcohol wipe and then screw the tip of a 10 mL 0.9% saline syringe onto the cap. 4. Using the syringe plunger, slowly push the 10 mL 0.9% saline in the syringe over 5-10 seconds. If resistance is met or pain occurs while pushing, stop pushing the saline immediately. 5. Remove the syringe  from the cap. 6. Return the stopcock lever to the usual position which is pointing in the direction of the cap. 7. Dispose of used supplies properly. REPLACING YOUR LEG BAG OR BEDSIDE DRAINAGE BAG  Replace your leg bag or bedside drainage bag, three-way stopcock, and any extension tubing as directed by your health care provider. Make sure you always have an extra drainage bag and connecting tubing available. 1. Empty urine from your leg bag or bedside drainage bag. 2. Gather new leg bag or bedside drainage bag, three-way stopcock, and any extension tubing. 3. Remove the leg bag or bedside drainage bag, three-way stopcock, and any extension tubing from the nephrostomy tube. 4. Attach the new leg bag or bedside drainage bag, three-way stopcock, and any extension tubing to the nephrostomy tube. 5. Dispose of the used leg bag or bedside drainage bag, three-way stopcock, and any extension tubing from the nephrostomy tube. SEEK IMMEDIATE MEDICAL CARE IF:  You have a fever.  You have abdominal pain during the first week  after nephrostomy tube insertion.  You have new appearance of blood in your urine.  You have drainage, redness, swelling or pain at your nephrostomy tube insertion site.  You have difficulty or pain with flushing the tube.  You notice a decrease in your urine output not explained by drinking less fluids.  Your nephrostomy tube comes out or the suture securing the tube comes free.   This information is not intended to replace advice given to you by your health care provider. Make sure you discuss any questions you have with your health care provider.   Document Released: 07/08/2013 Document Revised: 09/22/2013 Document Reviewed: 07/08/2013 Elsevier Interactive Patient Education 2016 Elsevier Inc. Percutaneous Nephrostomy, Care After Refer to this sheet in the next few weeks. These instructions provide you with information on caring for yourself after your procedure. Your health care provider may also give you more specific instructions. Your treatment has been planned according to current medical practices, but problems sometimes occur. Call your health care provider if you have any problems or questions after your procedure. WHAT TO EXPECT AFTER THE PROCEDURE You will need to remain lying down for several hours. HOME CARE INSTRUCTIONS  Your nephrostomy tube is connected to a leg bag or bedside drainage bag. Always keep the tubing, the leg bag, or the bedside drainage bags below the level of the kidney so that the urine drains freely.  During the day, if you are connecting the nephrostomy tube to a leg bag, be sure there are no kinks in the tubing and that the urine is draining freely.  At night, you may want to connect the nephrostomy tube or the leg bag to a larger bedside drainage bag.  Change the dressing as often as directed by your health care provider, or if it becomes wet.  Gently remove the tapes and dressing from around the nephrostomy tube. Be careful not to pull on the tube  while removing the dressing.  Wash the skin around the tube, rinse well, and dry.  Place two split drain sponges in and around the tube exit site.  Place tape around edge of the dressing.  Secure the nephrostomy tubing. Remember to make certain that the nephrostomy tube does not kink or become pinched closed. It can be useful to wrap any exposed tubing going from the nephrostomy tube to any of the connecting tubes to either the leg bag or drainage bag with an elastic bandage.  Every three weeks, replace the  leg bag, drainage bag, and any extension tubing connected to your nephrostomy tube. Your health care provider will explain how to change the drainage bag and extension tubing. SEEK MEDICAL CARE IF:  You experience any problems with any of the valves or tubing.  You have persistent pain or soreness in your back.  You have a fever or chills. SEEK IMMEDIATE MEDICAL CARE IF:  You have abdominal pain during the first week.  You have a new appearance of blood in your urine.  You have back pain that is not relieved by your medicine.  You have drainage, redness, swelling, or pain at the tube insertion site.  You have decreased urine output.  Your nephrostomy tube comes out.   This information is not intended to replace advice given to you by your health care provider. Make sure you discuss any questions you have with your health care provider.   Document Released: 05/10/2004 Document Revised: 10/08/2014 Document Reviewed: 05/14/2013 Elsevier Interactive Patient Education [employer].

## 2016-04-16 NOTE — Progress Notes (Signed)
Subjective:   04/16/2016 , 3:32 PM .  Patient was seen in Room 3 .   Patient ID: Natalie [name], female    DOB: 03-20-1951, 65 y.o.   MRN: [id number] Chief Complaint  Patient presents with  . Referral    Urology    HPI Natalie [name] is a 65 y.o. female who presents to [facility] requesting referral to urology. She was seen at the ER on 04/10/16 with complaints of fever and back pain. They placed her with an urinary catheter and requires a referral to urology. She mentions seeing [facility] about 18 months for kidney stone.   Patient still reports having intermittent nausea and draining fatigue. She notes being off metformin while in the hospital but is now back on it. She's still taking her iron supplements. She's noticed hematuria in her bag today.   Past Medical History  Diagnosis Date  . Hyperlipidemia   . Hypertension   . Diabetes mellitus without complication ([place])    Prior to Admission medications   Medication Sig Start Date End Date Taking? Authorizing Provider  atorvastatin (LIPITOR) 20 MG tablet Take 1 tablet (20 mg total) by mouth daily. 01/04/16   Leandrew Koyanagi, MD  butorphanol (STADOL) 10 MG/ML nasal spray Place 1 spray into the nose every 4 (four) hours as needed for migraine. May refill weekly. To follow last prescription of 02/29/16. Awaiting transfer to Pain Management program at [facility]. 04/02/16   Leandrew Koyanagi, MD  cephALEXin (KEFLEX) 250 MG capsule Take 1 capsule (250 mg total) by mouth every 6 (six) hours. For 5days 04/14/16   Domenic Polite, MD  clobetasol cream (TEMOVATE) 0.05 % Apply topically 2 (two) times daily. 01/21/16   Leandrew Koyanagi, MD  ESTRACE VAGINAL 0.1 MG/GM vaginal cream insert 1/4 gram vaginally two times a week 12/03/15   Historical Provider, MD  fluorometholone (FML) 0.1 % ophthalmic suspension USE 1 DROP INTO BOTH EYES 4 TIMES A DAY FOR 7 DAYS 03/28/16   Historical Provider, MD  HYDROcodone-acetaminophen (NORCO/VICODIN) 5-325 MG tablet Take 1 tablet  by mouth every 6 (six) hours as needed for moderate pain or severe pain. 04/14/16   Domenic Polite, MD  levothyroxine (SYNTHROID, LEVOTHROID) 75 MCG tablet Take 1 tablet (75 mcg total) by mouth daily. 01/04/16   Leandrew Koyanagi, MD  losartan (COZAAR) 50 MG tablet Take 1 tablet (50 mg total) by mouth daily. 01/04/16   Leandrew Koyanagi, MD  metFORMIN (GLUCOPHAGE) 1000 MG tablet Take 1 tablet (1,000 mg total) by mouth 2 (two) times daily with a meal. 01/04/16   Leandrew Koyanagi, MD  ondansetron (ZOFRAN-ODT) 8 MG disintegrating tablet DISSOLVE 1 TABLET ON OR UNDER THE TONGUE EVERY 8 HOURS AS NEEDED FOR NAUSEA 12/29/15   Leandrew Koyanagi, MD  PHENobarbital (LUMINAL) 97.2 MG tablet TAKE TWO TABLETS BY MOUTH ONCE DAILY ON  MON,  WED,  AND  FRI  AND  1  TABLET  DAILY  ON  OTHER  DAYS 01/04/16   Leandrew Koyanagi, MD  terconazole (TERAZOL 3) 0.8 % vaginal cream Place 1 applicator vaginally at bedtime.  12/05/15   Historical Provider, MD  zolpidem (AMBIEN) 10 MG tablet take 1 tablet by mouth at bedtime for sleep if needed 04/02/16   Leandrew Koyanagi, MD   Allergies  Allergen Reactions  . Compazine     Bad headache, vomiting   . Cymbalta [Duloxetine Hcl]     unknown  . Escitalopram Oxalate Other (See Comments)  Can't sleep  . Methadone Hives  . Nitrofuran Derivatives     unknown    Review of Systems  Constitutional: Positive for fatigue. Negative for unexpected weight change.  Respiratory: Negative for chest tightness and shortness of breath.   Cardiovascular: Negative for chest pain, palpitations and leg swelling.  Gastrointestinal: Positive for nausea. Negative for abdominal pain and blood in stool.  Genitourinary: Positive for hematuria.  Neurological: Negative for dizziness, syncope, light-headedness and headaches.       Objective:   Physical Exam  Constitutional: She is oriented to person, place, and time. She appears well-developed and well-nourished. No distress.  HENT:  Head:  Normocephalic and atraumatic.  Eyes: EOM are normal. Pupils are equal, round, and reactive to light.  Neck: Neck supple.  Cardiovascular: Normal rate.   Pulmonary/Chest: Effort normal. No respiratory distress.  Musculoskeletal: Normal range of motion.  Neurological: She is alert and oriented to person, place, and time.  Skin: Skin is warm and dry.  Psychiatric: She has a normal mood and affect. Her behavior is normal.  Nursing note and vitals reviewed.   BP 140/80 mmHg  Pulse 97  Temp(Src) 98.8 F (37.1 C) (Oral)  Resp 18  Ht 4\' 9"  (1.448 m)  Wt 155 lb (70.308 kg)  BMI 33.53 kg/m2  SpO2 96%   Results for orders placed or performed in visit on 04/16/16  POCT CBC  Result Value Ref Range   WBC 9.7 4.6 - 10.2 K/uL   Lymph, poc 2.3 0.6 - 3.4   POC LYMPH PERCENT 23.7 10 - 50 %L   MID (cbc) 0.3 0 - 0.9   POC MID % 3.3 0 - 12 %M   POC Granulocyte 7.1 (A) 2 - 6.9   Granulocyte percent 73.0 37 - 80 %G   RBC 3.64 (A) 4.04 - 5.48 M/uL   Hemoglobin 9.6 (A) 12.2 - 16.2 g/dL   HCT, POC 29.4 (A) 37.7 - 47.9 %   MCV 80.7 80 - 97 fL   MCH, POC 26.4 (A) 27 - 31.2 pg   MCHC 32.7 31.8 - 35.4 g/dL   RDW, POC 17.6 %   Platelet Count, POC 357 142 - 424 K/uL   MPV 8.2 0 - 99.8 fL      Assessment & Plan:   1. Pyelonephritis - Ambulatory referral to Urology - BASIC METABOLIC PANEL WITH GFR - POCT CBC  2. AKI (acute kidney injury) ([place]) - Ambulatory referral to Urology - BASIC METABOLIC PANEL WITH GFR  3. Nephrostomy tube bleed ([place]) - Ambulatory referral to Urology - BASIC METABOLIC PANEL WITH GFR  4. Iron deficiency anemia - POCT CBC  5. Gross hematuria  6. Type 2 diabetes mellitus with other diabetic kidney complication, without long-term current use of insulin (HCC)   Patient's hemoglobin has improved since two days ago (7.5-->9.6). Informed that Urology referral has been sent. Pt to return to clinic Friday if has not seen Urology by this point to follow up on labs as she  will have completed course of Keflex at this time.   Tenna Delaine PA-C.   Pt was independently assessed by myself as well.  . The recorded information has been reviewed and considered, and addended by me as needed.   Delman Cheadle, M.D. [facility] [address] [place], [place] [address] [phone] phone [phone] fax  04/16/2016 10:55 PM

## 2016-04-17 LAB — BASIC METABOLIC PANEL WITH GFR
BUN: 7 mg/dL (ref 7–25)
CALCIUM: 9.1 mg/dL (ref 8.6–10.4)
CO2: 26 mmol/L (ref 20–31)
CREATININE: 1.24 mg/dL — AB (ref 0.50–0.99)
Chloride: 99 mmol/L (ref 98–110)
GFR, EST AFRICAN AMERICAN: 52 mL/min — AB (ref >=60)
GFR, EST NON AFRICAN AMERICAN: 45 mL/min — AB (ref >=60)
GLUCOSE: 268 mg/dL — AB (ref 65–99)
Potassium: 3.5 mmol/L (ref 3.5–5.3)
Sodium: 139 mmol/L (ref 135–146)

## 2016-04-17 NOTE — Discharge Summary (Signed)
Physician Discharge Summary  Natalie [name] [id number] DOB: [birth year] DOA: 04/10/2016  PCP: Leandrew Koyanagi, MD  Admit date: 04/10/2016 Discharge date: 04/14/2016  Time spent: 35 minutes  Recommendations for Outpatient Follow-up:  1. 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  HPI: Natalie [name] is a 65 y.o. female with medical history significant of hypertension, DM, migraines, seizure disorder, hyperlipidemia, Recurrent UTI's, renal stones, was seen normal 2 says ago, but since yesterday, pt has been confused, had fever , back pain, nausea  Hospital Course:  Sepsis due to R pyelonephritis -with severe R hydronephrosis and 2.4 Cm stone at Renal pelvis noted on admission -Urology Dr.Dahlstedt consulted, recommended IR consult to decompress R Kidney/Nephrostomy  -IR Dr.Wagner consulted, s/p Perc Nephrostomy 7/13 -completed 3days of IV ceftriaxone, urine Cx grew Klebsiella and transitioned to PO Keflex  Volume overload-iatrogenic -2-3L positive and crackles in lungs -due to volume resuscitation on admission, got 2 doses of IV lasix and improved -euvolemic since then  Anemia -acute on chronic, due to dilution -monitor, no overt blood loss  DM 2:stable, resumed metformin at DC  H/o seizures -resumed Phenobarbital  HTN (hypertension) -stable, hold ARB  AKI -due to sepsis, ARB,   -improved  Hypokalemia -replaced  Procedures: Image guided right nephrostomy, with 54F pigtail into lower posterior calyx.  Consultations:  Urology Dr.Dalhstedt  IR Dr.Wagner  Discharge Exam: Filed Vitals:   04/14/16 0550 04/14/16 0900  BP: 145/79 169/71  Pulse: 81 82  Temp: 98.1 F (36.7 C) 98.5 F (36.9 C)  Resp: 18 18    General: AAOx3 Cardiovascular: S1S2/RRR Respiratory:CTAB  Discharge Instructions   Discharge Instructions    Diet - low sodium heart healthy    Complete by:  As directed      Increase activity slowly    Complete by:  As directed           Discharge Medication List as of 04/14/2016 11:26 AM    START taking these medications   Details  cephALEXin (KEFLEX) 250 MG capsule Take 1 capsule (250 mg total) by mouth every 6 (six) hours. For 5days, Starting 04/14/2016, Until Discontinued, Print    HYDROcodone-acetaminophen (NORCO/VICODIN) 5-325 MG tablet Take 1 tablet by mouth every 6 (six) hours as needed for moderate pain or severe pain., Starting 04/14/2016, Until Discontinued, Print      CONTINUE these medications which have NOT CHANGED   Details  atorvastatin (LIPITOR) 20 MG tablet Take 1 tablet (20 mg total) by mouth daily., Starting 01/04/2016, Until Discontinued, Normal    butorphanol (STADOL) 10 MG/ML nasal spray Place 1 spray into the nose every 4 (four) hours as needed for migraine. May refill weekly. To follow last prescription of 02/29/16. Awaiting transfer to Pain Management program at [facility]., Starting 04/02/2016, Until Discontinued, Print    clobetasol cream (TEMOVATE) 0.05 % Apply topically  2 (two) times daily., Starting 01/21/2016, Until Discontinued, Normal    ESTRACE VAGINAL 0.1 MG/GM vaginal cream insert 1/4 gram vaginally two times a week, Historical Med    fluorometholone (FML) 0.1 % ophthalmic suspension USE 1 DROP INTO BOTH EYES 4 TIMES A DAY FOR 7 DAYS, Historical Med    levothyroxine (SYNTHROID, LEVOTHROID) 75 MCG tablet Take 1  tablet (75 mcg total) by mouth daily., Starting 01/04/2016, Until Discontinued, Normal    losartan (COZAAR) 50 MG tablet Take 1 tablet (50 mg total) by mouth daily., Starting 01/04/2016, Until Discontinued, Normal    metFORMIN (GLUCOPHAGE) 1000 MG tablet Take 1 tablet (1,000 mg total) by mouth 2 (two) times daily with a meal., Starting 01/04/2016, Until Discontinued, Normal    ondansetron (ZOFRAN-ODT) 8 MG disintegrating tablet DISSOLVE 1 TABLET ON OR UNDER THE TONGUE EVERY 8 HOURS AS NEEDED FOR NAUSEA, Normal    PHENobarbital (LUMINAL) 97.2 MG tablet TAKE TWO TABLETS BY MOUTH ONCE DAILY ON  MON,  WED,  AND  FRI  AND  1  TABLET  DAILY  ON  OTHER  DAYS, Print    terconazole (TERAZOL 3) 0.8 % vaginal cream Place 1 applicator vaginally at bedtime. , Starting 12/05/2015, Until Discontinued, Historical Med    zolpidem (AMBIEN) 10 MG tablet take 1 tablet by mouth at bedtime for sleep if needed, Print       Allergies  Allergen Reactions  . Compazine     Bad headache, vomiting   . Cymbalta [Duloxetine Hcl]     unknown  . Escitalopram Oxalate Other (See Comments)    Can't sleep  . Methadone Hives  . Nitrofuran Derivatives     unknown   Follow-up Information    Follow up with Malka So, MD. Schedule an appointment as soon as possible for a visit in 10 days.   Specialty:  Urology   Contact information:   [place] [id number] [phone]        The results of significant diagnostics from this hospitalization (including imaging, microbiology, ancillary and laboratory) are listed below for reference.    Significant Diagnostic Studies: Dg Chest Port 1 View  04/12/2016  CLINICAL DATA:  Oxygen desaturation EXAM: PORTABLE CHEST 1 VIEW COMPARISON:  04/10/2016 FINDINGS: Shallow inspiration. Increasing bilateral perihilar infiltration since previous study. This could be due to edema or bilateral pneumonia. Heart size is normal. No blunting of costophrenic angles. No pneumothorax.  IMPRESSION: Developing bilateral perihilar infiltration could indicate edema or pneumonia. Electronically Signed   By: Lucienne Capers M.D.   On: 04/12/2016 05:37   Dg Chest Port 1 View  04/10/2016  CLINICAL DATA:  Fevers for 1 day, initial encounter EXAM: PORTABLE CHEST 1 VIEW COMPARISON:  09/06/2014 FINDINGS: Cardiac shadow is mildly prominent accentuated by the portable technique. Patient rotation to the right is noted accentuating the mediastinal markings. The overall inspiratory effort is poor with crowding of the vascular markings although no focal confluent infiltrate is seen. IMPRESSION: Poor inspiratory effort.  No definitive infiltrate is noted. Electronically Signed   By: Inez Catalina M.D.   On: 04/10/2016 19:39   Ct Renal Stone Study  04/11/2016  CLINICAL DATA:  65 year old female with right flank pain and fever since yesterday. Initial encounter. EXAM: CT ABDOMEN AND PELVIS WITHOUT CONTRAST TECHNIQUE: Multidetector CT imaging of the abdomen and pelvis was performed following the standard protocol without IV contrast. COMPARISON:  Abdomen radiograph 09/01/2014. [facility] CT Abdomen and Pelvis 01/01/2011 FINDINGS: Mild cardiomegaly and  elevation of the right hemidiaphragm are chronic. No pleural effusion. Mild atelectasis or scarring at both lung bases. Calcified coronary artery atherosclerosis. No acute osseous abnormality identified. Numerous bilateral posterior flank calcified injection granulomas and/or fat necrosis with confluent areas of subcutaneous scarring. Aortoiliac calcified atherosclerosis noted. Small volume pelvic free fluid. Mildly increased dystrophic calcifications in the uterus which might be fibroid related. Otherwise stable and negative noncontrast uterus and adnexa. Increased gaseous distension of the rectum. Sigmoid redundancy with mild to moderate diverticulosis, but no active inflammation. Mild diverticulosis in the left colon without inflammation.  Retained stool in the transverse colon and right colon. A small volume of free fluid in the right lower quadrant obscures the appendix today. Decompressed terminal ileum. No dilated small bowel. Decompressed stomach. Moderate size gastric hiatal hernia has increased since 2012. Surgically absent gallbladder. Negative noncontrast liver, spleen, pancreas and adrenal glands. Left renal atrophy since 2012. Associated left perinephric stranding. Mild periureteral stranding. There is a large oval calculus at the left ureterovesical junction measuring 11 mm length by 8 mm diameter. There also appears to be a second similar 9-10 mm to upstream distal left ureter calculus (series 301, image 71). There are small superimposed pelvic phleboliths. On the right there is nephromegaly and moderate to severe hydronephrosis with a large, lobulated 16 x 24 x 24 mm (AP by transverse by CC) calculus at the right renal pelvis. Evidence of surrounding urothelial thickening. Additional right intra renal calculi up to 8 mm. Right hydroureter and mild to moderate periureteral stranding. Generalized mild to moderate right para renal space stranding. No additional right ureteral calculus to the level of the ureterovesical junction. No other calculus within the urinary bladder. Low level retroperitoneal lymph nodes are increased since 2012, appear reactive. IMPRESSION: 1. Acute inflammation of the right kidney and ureter with moderate to severe right hydronephrosis and bulky 2.4 cm stone at the right renal pelvis. Probable underlying chronic urothelial inflammation. Pyelonephritis/pyonephrosis superimposed on obstruction should be considered, and there is reactive retroperitoneal lymphadenopathy as well as perinephric stranding and small volume abdominal and pelvic free fluid. 2. 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 3. Progressed moderate gastric hiatal  hernia. 4. Calcified aortic atherosclerosis. Electronically Signed   By: Genevie Ann M.D.   On: 04/11/2016 08:11   Ir Nephrostomy Placement Right  04/12/2016  INDICATION: 65 year old female with a history of obstruction right kidney and admission for sepsis. She presents for percutaneous nephrostomy EXAM: IR NEPHROSTOMY PLACEMENT RIGHT COMPARISON:  None. MEDICATIONS: Rocephin 1 gm IV; The antibiotic was administered in an appropriate time frame prior to skin puncture. ANESTHESIA/SEDATION: Fentanyl 100 mcg IV; Versed 1.0 mg IV Moderate Sedation Time:  30 The patient was continuously monitored during the procedure by the interventional radiology nurse under my direct supervision. CONTRAST:  Twenty - administered into the collecting system(s) FLUOROSCOPY TIME:  Fluoroscopy Time: 5 minutes 24 seconds (129 mGy). COMPLICATIONS: None PROCEDURE: Informed written consent was obtained from the patient and the patient's family after a thorough discussion of the procedural risks, benefits and alternatives. All questions were addressed. Maximal Sterile Barrier Technique was utilized including caps, mask, sterile gowns, sterile gloves, sterile drape, hand hygiene and skin antiseptic. A timeout was performed prior to the initiation of the procedure. Patient positioned prone position on the fluoroscopy table. Ultrasound survey of the right flank was performed with images stored and sent to PACs. The patient was then prepped and  draped in the usual sterile fashion. 1% lidocaine was used to anesthetize the skin and subcutaneous tissues for local anesthesia. Ultrasound guidance was used to access the collecting system, targeting stone in the pelvis of the kidney. With spontaneous urine returned, a subsequent contrast injection confirmed location within the collecting system. A double stick technique was then used to access a posterior inferior calyx, after injecting contrast and air. A Chiba needle was then used to access a posterior  inferior calyx with fluoroscopic guidance. With spontaneous urine returned through the needle, passage of an 018 micro wire into the collecting system was performed under fluoroscopy. A small incision was made with an 11 blade scalpel, and the needle was removed from the wire. An Accustick system was then advanced over the wire into the collecting system under fluoroscopy. The metal stiffener and inner dilator were removed, and then a sample of fluid was aspirated through the 4 French outer sheath. Bentson wire was passed into the collecting system and the sheath removed. Ten French dilation of the soft tissues was performed. Using modified Seldinger technique, a 10 French pigtail catheter drain was placed over the Bentson wire. Wire and inner stiffener removed, and the pigtail was formed in the collecting system. Small amount of contrast confirmed position of the catheter. Patient tolerated the procedure well and remained hemodynamically stable throughout. No complications were encountered and no significant blood loss encountered IMPRESSION: Status post right percutaneous nephrostomy for obstructed kidney. Signed, Dulcy Fanny. Earleen Newport, DO [facility] Radiology Electronically Signed   By: Corrie Mckusick D.O.   On: 04/12/2016 10:19    Microbiology: Recent Results (from the past 240 hour(s))  Urine culture     Status: Abnormal   Collection Time: 04/10/16  3:41 PM  Result Value Ref Range Status   Specimen Description URINE, CLEAN CATCH  Final   Special Requests NONE  Final   Culture >=100,000 COLONIES/mL KLEBSIELLA PNEUMONIAE (A)  Final   Report Status 04/12/2016 FINAL  Final   Organism ID, Bacteria KLEBSIELLA PNEUMONIAE (A)  Final      Susceptibility   Klebsiella pneumoniae - MIC*    AMPICILLIN >=32 RESISTANT Resistant     CEFAZOLIN <=4 SENSITIVE Sensitive     CEFTRIAXONE <=1 SENSITIVE Sensitive     CIPROFLOXACIN <=0.25 SENSITIVE Sensitive     GENTAMICIN  <=1 SENSITIVE Sensitive     IMIPENEM <=0.25 SENSITIVE Sensitive     NITROFURANTOIN 128 RESISTANT Resistant     TRIMETH/SULFA <=20 SENSITIVE Sensitive     AMPICILLIN/SULBACTAM 4 SENSITIVE Sensitive     PIP/TAZO <=4 SENSITIVE Sensitive     * >=100,000 COLONIES/mL KLEBSIELLA PNEUMONIAE  Blood Culture (routine x 2)     Status: None   Collection Time: 04/10/16  6:01 PM  Result Value Ref Range Status   Specimen Description BLOOD LEFT ANTECUBITAL  Final   Special Requests BOTTLES DRAWN AEROBIC ONLY 5CC  Final   Culture NO GROWTH 5 DAYS  Final   Report Status 04/15/2016 FINAL  Final  Blood Culture (routine x 2)     Status: None   Collection Time: 04/10/16  6:08 PM  Result Value Ref Range Status   Specimen Description BLOOD LEFT ANTECUBITAL  Final   Special Requests BOTTLES DRAWN AEROBIC AND ANAEROBIC 5CC  Final   Culture NO GROWTH 5 DAYS  Final   Report Status 04/15/2016 FINAL  Final  Urine culture     Status: Abnormal   Collection Time: 04/12/16 10:03 AM  Result  Value Ref Range Status   Specimen Description URINE, RANDOM RIGHT KIDNEY  Final   Special Requests NONE  Final   Culture 6,000 COLONIES/mL KLEBSIELLA PNEUMONIAE (A)  Final   Report Status 04/14/2016 FINAL  Final   Organism ID, Bacteria KLEBSIELLA PNEUMONIAE (A)  Final      Susceptibility   Klebsiella pneumoniae - MIC*    AMPICILLIN >=32 RESISTANT Resistant     CEFAZOLIN <=4 SENSITIVE Sensitive     CEFTRIAXONE <=1 SENSITIVE Sensitive     CIPROFLOXACIN <=0.25 SENSITIVE Sensitive     GENTAMICIN <=1 SENSITIVE Sensitive     IMIPENEM <=0.25 SENSITIVE Sensitive     NITROFURANTOIN 128 RESISTANT Resistant     TRIMETH/SULFA <=20 SENSITIVE Sensitive     AMPICILLIN/SULBACTAM 4 SENSITIVE Sensitive     PIP/TAZO <=4 SENSITIVE Sensitive     * 6,000 COLONIES/mL KLEBSIELLA PNEUMONIAE     Labs: Basic Metabolic Panel:  Recent Labs Lab 04/11/16 0119 04/12/16 0542 04/13/16 0450 04/14/16 0529 04/16/16 1532  NA 137 137 138 140 139  K  3.7 3.1* 3.9 3.4* 3.5  CL 105 112* 108 105 99  CO2 19* 19* 20* 25 26  GLUCOSE 148* 180* 189* 166* 268*  BUN 14 8 9 8 7   CREATININE 1.31* 1.02* 1.07* 1.17* 1.24*  CALCIUM 8.6* 7.4* 8.0* 8.5* 9.1   Liver Function Tests: No results for input(s): AST, ALT, ALKPHOS, BILITOT, PROT, ALBUMIN in the last 168 hours. No results for input(s): LIPASE, AMYLASE in the last 168 hours. No results for input(s): AMMONIA in the last 168 hours. CBC:  Recent Labs Lab 04/11/16 0119 04/12/16 0542 04/13/16 0450 04/14/16 0529 04/16/16 1540  WBC 11.8* 5.2 8.9 5.7 9.7  HGB 9.2* 7.6* 7.9* 7.5* 9.6*  HCT 31.3* 25.6* 26.6* 25.4* 29.4*  MCV 87.7 86.8 86.4 86.4 80.7  PLT 292 167 176 179  --    Cardiac Enzymes: No results for input(s): CKTOTAL, CKMB, CKMBINDEX, TROPONINI in the last 168 hours. BNP: BNP (last 3 results) No results for input(s): BNP in the last 8760 hours.  ProBNP (last 3 results) No results for input(s): PROBNP in the last 8760 hours.  CBG:  Recent Labs Lab 04/13/16 0738 04/13/16 1201 04/13/16 1712 04/13/16 2102 04/14/16 0734  GLUCAP 198* 198* 225* 245* 170*       SignedDomenic Polite MD.  [facility] 04/17/2016, 4:42 PM

## 2016-04-18 ENCOUNTER — Telehealth: Payer: Self-pay | Admitting: Family Medicine

## 2016-04-18 NOTE — Telephone Encounter (Signed)
Ms. Schmude called saying she's not heard from [facility] to schedule an appointment. I informed her that her notes were sent to them yesterday and that's more than likely why she's not heard from them. I told her it takes some day possibly days before she'll hear from them. She said Dr. Brigitte Pulse informed her to call back if she's not heard from them and she wanted me to send her a note. Please call the pt if necessary.  Pt's ph# [phone] Thank you.

## 2016-04-19 NOTE — Telephone Encounter (Signed)
[facility] at 1200 and is wanting to let dr Brigitte Pulse know and also would like to know if the meds dr Brigitte Pulse took her off of should she be back on them   Best numebr [phone]

## 2016-04-19 NOTE — Telephone Encounter (Signed)
She needs to come into the office tomorrow to be seen.  She needs her kidneys checked again to ensure they are not still worsening and to see if it is safe to restart on her medication and also decide of she needs a different diabetes medicine if we determine that we should not restart the metformin (and same for her bp with stopping the losartan.)  It is not ok to wait until Monday for this.

## 2016-04-19 NOTE — Telephone Encounter (Signed)
Pt notified and will be in tomorrow.

## 2016-04-20 ENCOUNTER — Ambulatory Visit (INDEPENDENT_AMBULATORY_CARE_PROVIDER_SITE_OTHER): Payer: Medicare Other | Admitting: Family Medicine

## 2016-04-20 VITALS — BP 116/80 | HR 80 | Temp 98.4°F | Resp 18 | Ht <= 58 in | Wt 152.2 lb

## 2016-04-20 DIAGNOSIS — I10 Essential (primary) hypertension: Secondary | ICD-10-CM | POA: Diagnosis not present

## 2016-04-20 DIAGNOSIS — R509 Fever, unspecified: Secondary | ICD-10-CM | POA: Diagnosis not present

## 2016-04-20 DIAGNOSIS — E1129 Type 2 diabetes mellitus with other diabetic kidney complication: Secondary | ICD-10-CM | POA: Diagnosis not present

## 2016-04-20 DIAGNOSIS — R809 Proteinuria, unspecified: Secondary | ICD-10-CM | POA: Diagnosis not present

## 2016-04-20 DIAGNOSIS — N179 Acute kidney failure, unspecified: Secondary | ICD-10-CM

## 2016-04-20 DIAGNOSIS — N390 Urinary tract infection, site not specified: Secondary | ICD-10-CM

## 2016-04-20 LAB — COMPREHENSIVE METABOLIC PANEL
ALT: 18 U/L (ref 6–29)
AST: 18 U/L (ref 10–35)
Albumin: 3.7 g/dL (ref 3.6–5.1)
Alkaline Phosphatase: 77 U/L (ref 33–130)
BILIRUBIN TOTAL: 0.2 mg/dL (ref 0.2–1.2)
BUN: 13 mg/dL (ref 7–25)
CHLORIDE: 96 mmol/L — AB (ref 98–110)
CO2: 29 mmol/L (ref 20–31)
CREATININE: 1.11 mg/dL — AB (ref 0.50–0.99)
Calcium: 9.5 mg/dL (ref 8.6–10.4)
Glucose, Bld: 328 mg/dL — ABNORMAL HIGH (ref 65–99)
Potassium: 4.2 mmol/L (ref 3.5–5.3)
SODIUM: 138 mmol/L (ref 135–146)
TOTAL PROTEIN: 6.9 g/dL (ref 6.1–8.1)

## 2016-04-20 LAB — POCT CBC
Granulocyte percent: 67.5 %G (ref 37–80)
HCT, POC: 30.8 % — AB (ref 37.7–47.9)
HEMOGLOBIN: 9.9 g/dL — AB (ref 12.2–16.2)
LYMPH, POC: 2.5 (ref 0.6–3.4)
MCH, POC: 26.3 pg — AB (ref 27–31.2)
MCHC: 32.3 g/dL (ref 31.8–35.4)
MCV: 81.5 fL (ref 80–97)
MID (cbc): 0.5 (ref 0–0.9)
MPV: 7.9 fL (ref 0–99.8)
POC Granulocyte: 6.1 (ref 2–6.9)
POC LYMPH %: 27.2 % (ref 10–50)
POC MID %: 5.3 % (ref 0–12)
Platelet Count, POC: 406 10*3/uL (ref 142–424)
RBC: 3.78 M/uL — AB (ref 4.04–5.48)
RDW, POC: 16.6 %
WBC: 9.1 10*3/uL (ref 4.6–10.2)

## 2016-04-20 NOTE — Progress Notes (Signed)
Patient ID: Natalie [name], female    DOB: 06-Jun-1951, 65 y.o.   MRN: [id number]  PCP: Leandrew Koyanagi, MD  Chief Complaint  Patient presents with  . Follow-up    Pt. was told to come back by Dr. Brigitte Pulse for more bloodwork for her kidneys. See telephone note from 07/19    Subjective:   HPI Presents for evaluation of kidney function, hemoglobin and hematocrit.  She was previously evaluated at [facility] on 04/16/2016 and requested a referral to urology following an ED visit 04/10/16 in which she was treated for pyelonephritis and renal stone.  During ED encounter she  received a nephrotomy tube and with catheter drainage bag which remains in place to the right kidney.  Patient is here today to follow-up regarding abnormal lab results of elevated creatinine, and low hemoglobin and hematocrit. She has type 2 diabetes and hypertension.   Following the results of abnormal lab on 04/16/16,  she has since stopped metformin and losartan due to increase risk of nephrotoxcity.  She has received an appointment from [facility] for 04/23/16.    Review of Systems  Constitutional: Positive for fatigue. Negative for appetite change, chills, diaphoresis and fever.  Respiratory: Negative.   Cardiovascular: Negative.   Gastrointestinal: Negative.   Genitourinary: Negative for flank pain.       Reports producing a moderate volume of urine at a constant rate of flow.  Neurological: Negative.        Patient Active Problem List   Diagnosis Date Noted  . Sepsis ([place]) 04/10/2016  . Acute kidney injury ([place]) 04/10/2016  . Type 2 diabetes mellitus with other diabetic kidney complication ([place]) [id number]  . Migraine, chronic, without aura, intractable, with status migrainosus 01/04/2016  . Abdominal pain   . AKI (acute kidney injury) ([place])   . Pyelonephritis 08/31/2014  . 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 BMI 33.0-33.9,adult 03/30/2013  .  Recurrent UTI 09/17/2012  . DM (diabetes mellitus) ([place]) 03/19/2012  . HTN (hypertension) 03/19/2012  . Hyperlipemia 03/19/2012  . Hypothyroidism 03/19/2012  . Seizure disorder ([place]) 03/19/2012  . Insomnia 03/19/2012  . Migraine 03/19/2012     Prior to Admission medications   Medication Sig Start Date End Date Taking? Authorizing Provider  atorvastatin (LIPITOR) 20 MG tablet Take 1 tablet (20 mg total) by mouth daily. 01/04/16  Yes Leandrew Koyanagi, MD  butorphanol (STADOL) 10 MG/ML nasal spray Place 1 spray into the nose every 4 (four) hours as needed for migraine. May refill weekly. To follow last prescription of 02/29/16. Awaiting transfer to Pain Management program at [facility]. 04/02/16  Yes Leandrew Koyanagi, MD  cephALEXin (KEFLEX) 250 MG capsule Take 1 capsule (250 mg total) by mouth every 6 (six) hours. For 5days 04/14/16  Yes Domenic Polite, MD  clobetasol cream (TEMOVATE) 0.05 % Apply topically 2 (two) times daily. 01/21/16  Yes Leandrew Koyanagi, MD  ESTRACE VAGINAL 0.1 MG/GM vaginal cream insert 1/4 gram vaginally two times a week 12/03/15  Yes Historical Provider, MD  fluorometholone (FML) 0.1 % ophthalmic suspension USE 1 DROP INTO BOTH EYES 4 TIMES A DAY FOR 7 DAYS 03/28/16  Yes Historical Provider, MD  HYDROcodone-acetaminophen (NORCO/VICODIN) 5-325 MG tablet Take 1 tablet by mouth every 6 (six) hours as needed for moderate pain or severe pain. 04/14/16  Yes Domenic Polite, MD  levothyroxine (SYNTHROID, LEVOTHROID) 75 MCG tablet Take 1 tablet (75 mcg total) by mouth daily. 01/04/16  Yes Leandrew Koyanagi,  MD  losartan (COZAAR) 50 MG tablet Take 1 tablet (50 mg total) by mouth daily. 01/04/16  Yes Leandrew Koyanagi, MD  metFORMIN (GLUCOPHAGE) 1000 MG tablet Take 1 tablet (1,000 mg total) by mouth 2 (two) times daily with a meal. 01/04/16  Yes Leandrew Koyanagi, MD  ondansetron (ZOFRAN-ODT) 8 MG disintegrating tablet DISSOLVE 1 TABLET ON OR UNDER THE TONGUE EVERY 8 HOURS AS NEEDED FOR NAUSEA  12/29/15  Yes Leandrew Koyanagi, MD  PHENobarbital (LUMINAL) 97.2 MG tablet TAKE TWO TABLETS BY MOUTH ONCE DAILY ON  MON,  WED,  AND  FRI  AND  1  TABLET  DAILY  ON  OTHER  DAYS 01/04/16  Yes Leandrew Koyanagi, MD  terconazole (TERAZOL 3) 0.8 % vaginal cream Place 1 applicator vaginally at bedtime.  12/05/15  Yes Historical Provider, MD  zolpidem (AMBIEN) 10 MG tablet take 1 tablet by mouth at bedtime for sleep if needed 04/02/16  Yes Leandrew Koyanagi, MD     Allergies  Allergen Reactions  . Compazine     Bad headache, vomiting   . Cymbalta [Duloxetine Hcl]     unknown  . Escitalopram Oxalate Other (See Comments)    Can't sleep  . Methadone Hives  . Nitrofuran Derivatives     unknown       Objective:  Physical Exam  Constitutional: She is oriented to person, place, and time. She appears well-developed and well-nourished.  HENT:  Head: Normocephalic.  Genitourinary:  Genitourinary Comments: Nephrostomy tube right kidney. Drainage catheter intact No evidence of leakage,  Urine appears mostly clear, absent of odor or sedimentation.   Musculoskeletal: Normal range of motion.  Neurological: She is alert and oriented to person, place, and time. She has normal reflexes.  Skin: Skin is warm and dry.           Assessment & Plan:  Today is a lab follow-up to evaluate kidney function and anemia. Hematocrit and hemoglobin remain low, but stable at 9.9 compared to 04/16/16 value of 9.6.  Plan: POCT CBC w/Diff-stable Comprehensive Metabolic Panel-Pending results  Patient will continue to hold Metformin and Losartan pending results of labs.   I will follow-up with you regarding plan for diabetes and hypertension medications upon receipt of lab results. Follow-up with urology on Monday.  Carroll Sage. Kenton Kingfisher, MSN, FNP-C [facility]

## 2016-04-20 NOTE — Patient Instructions (Addendum)
We will contact you regarding lab results.   IF you received an x-ray today, you will receive an invoice from [facility]. Please contact [facility] at [phone] with questions or concerns regarding your invoice.   IF you received labwork today, you will receive an invoice from [employer]. Please contact [facility] at [phone] with questions or concerns regarding your invoice.   Our billing staff will not be able to assist you with questions regarding bills from these companies.  You will be contacted with the lab results as soon as they are available. The fastest way to get your results is to activate your My Chart account. Instructions are located on the last page of this paperwork. If you have not heard from [place] regarding the results in 2 weeks, please contact this office.

## 2016-04-22 MED ORDER — AMLODIPINE BESYLATE 5 MG PO TABS: 5.0000 mg | ORAL_TABLET | Freq: Every day | ORAL | 1 refills | Status: DC

## 2016-04-22 MED ORDER — ZOLPIDEM TARTRATE 10 MG PO TABS: ORAL_TABLET | ORAL | 0 refills | Status: DC

## 2016-04-22 MED ORDER — SITAGLIPTIN PHOSPHATE 50 MG PO TABS: 50.0000 mg | ORAL_TABLET | Freq: Every day | ORAL | 1 refills | Status: DC

## 2016-04-22 NOTE — Addendum Note (Signed)
Addended by: Scot Jun on: 04/22/2016 11:52 AM   Modules accepted: Orders

## 2016-04-22 NOTE — Addendum Note (Signed)
Addended by: Scot Jun on: 04/22/2016 12:27 PM   Modules accepted: Orders

## 2016-04-23 ENCOUNTER — Other Ambulatory Visit: Payer: Self-pay | Admitting: Physician Assistant

## 2016-04-23 DIAGNOSIS — N200 Calculus of kidney: Secondary | ICD-10-CM | POA: Diagnosis not present

## 2016-04-23 DIAGNOSIS — G4700 Insomnia, unspecified: Secondary | ICD-10-CM

## 2016-04-23 DIAGNOSIS — N136 Pyonephrosis: Secondary | ICD-10-CM | POA: Diagnosis not present

## 2016-04-23 MED ORDER — ZOLPIDEM TARTRATE 10 MG PO TABS: ORAL_TABLET | ORAL | 0 refills | Status: DC

## 2016-04-23 NOTE — Progress Notes (Signed)
NP Molli Barrows refilled [place] yesterday but apparently rx did not print. I have printed new rx. Please let pt know ready for pick up.

## 2016-04-25 ENCOUNTER — Other Ambulatory Visit: Payer: Self-pay | Admitting: Urology

## 2016-04-25 ENCOUNTER — Encounter (HOSPITAL_COMMUNITY): Payer: Self-pay | Admitting: Emergency Medicine

## 2016-04-25 ENCOUNTER — Telehealth: Payer: Self-pay

## 2016-04-25 ENCOUNTER — Emergency Department (HOSPITAL_COMMUNITY)
Admission: EM | Admit: 2016-04-25 | Discharge: 2016-04-25 | Disposition: A | Discharge status: Home / Self Care | Payer: Medicare Other | Attending: Emergency Medicine | Admitting: Emergency Medicine

## 2016-04-25 DIAGNOSIS — N390 Urinary tract infection, site not specified: Secondary | ICD-10-CM | POA: Insufficient documentation

## 2016-04-25 DIAGNOSIS — E039 Hypothyroidism, unspecified: Secondary | ICD-10-CM | POA: Insufficient documentation

## 2016-04-25 DIAGNOSIS — Z79899 Other long term (current) drug therapy: Secondary | ICD-10-CM | POA: Diagnosis not present

## 2016-04-25 DIAGNOSIS — I10 Essential (primary) hypertension: Secondary | ICD-10-CM | POA: Diagnosis not present

## 2016-04-25 DIAGNOSIS — E1165 Type 2 diabetes mellitus with hyperglycemia: Secondary | ICD-10-CM | POA: Diagnosis present

## 2016-04-25 DIAGNOSIS — Z7984 Long term (current) use of oral hypoglycemic drugs: Secondary | ICD-10-CM | POA: Diagnosis not present

## 2016-04-25 LAB — BASIC METABOLIC PANEL
ANION GAP: 14 (ref 5–15)
BUN: 21 mg/dL — AB (ref 6–20)
CHLORIDE: 92 mmol/L — AB (ref 101–111)
CO2: 26 mmol/L (ref 22–32)
Calcium: 10.8 mg/dL — ABNORMAL HIGH (ref 8.9–10.3)
Creatinine, Ser: 1.47 mg/dL — ABNORMAL HIGH (ref 0.44–1.00)
GFR, EST AFRICAN AMERICAN: 41 mL/min — AB (ref >60)
GFR, EST NON AFRICAN AMERICAN: 36 mL/min — AB (ref >60)
Glucose, Bld: 470 mg/dL — ABNORMAL HIGH (ref 65–99)
POTASSIUM: 3.6 mmol/L (ref 3.5–5.1)
SODIUM: 132 mmol/L — AB (ref 135–145)

## 2016-04-25 LAB — URINE MICROSCOPIC-ADD ON

## 2016-04-25 LAB — CBC
HEMATOCRIT: 35 % — AB (ref 36.0–46.0)
HEMOGLOBIN: 10.5 g/dL — AB (ref 12.0–15.0)
MCH: 26.3 pg (ref 26.0–34.0)
MCHC: 30 g/dL (ref 30.0–36.0)
MCV: 87.5 fL (ref 78.0–100.0)
Platelets: 456 10*3/uL — ABNORMAL HIGH (ref 150–400)
RBC: 4 MIL/uL (ref 3.87–5.11)
RDW: 15.3 % (ref 11.5–15.5)
WBC: 10.8 10*3/uL — AB (ref 4.0–10.5)

## 2016-04-25 LAB — I-STAT VENOUS BLOOD GAS, ED
ACID-BASE EXCESS: 7 mmol/L — AB (ref 0.0–2.0)
Bicarbonate: 32.6 mEq/L — ABNORMAL HIGH (ref 20.0–24.0)
O2 SAT: 30 %
PH VEN: 7.423 — AB (ref 7.250–7.300)
PO2 VEN: 19 mmHg — AB (ref 31.0–45.0)
TCO2: 34 mmol/L (ref 0–100)
pCO2, Ven: 49.9 mmHg (ref 45.0–50.0)

## 2016-04-25 LAB — CBG MONITORING, ED
Glucose-Capillary: 470 mg/dL — ABNORMAL HIGH (ref 65–99)
Glucose-Capillary: 432 mg/dL — ABNORMAL HIGH (ref 65–99)

## 2016-04-25 LAB — URINALYSIS, ROUTINE W REFLEX MICROSCOPIC
Bilirubin Urine: NEGATIVE
KETONES UR: NEGATIVE mg/dL
NITRITE: POSITIVE — AB
PH: 5.5 (ref 5.0–8.0)
Protein, ur: >300 mg/dL — AB
SPECIFIC GRAVITY, URINE: 1.034 — AB (ref 1.005–1.030)

## 2016-04-25 MED ORDER — DEXAMETHASONE 4 MG PO TABS: 10.0000 mg | ORAL_TABLET | Freq: Once | ORAL | Status: DC

## 2016-04-25 MED ORDER — IBUPROFEN 800 MG PO TABS: 800.0000 mg | ORAL_TABLET | Freq: Once | ORAL | Status: DC

## 2016-04-25 MED ORDER — SULFAMETHOXAZOLE-TRIMETHOPRIM 800-160 MG PO TABS: 1.0000 | ORAL_TABLET | Freq: Two times a day (BID) | ORAL | 0 refills | Status: AC

## 2016-04-25 MED ORDER — SODIUM CHLORIDE 0.9 % IV BOLUS (SEPSIS)
2000.0000 mL | Freq: Once | INTRAVENOUS | Status: AC
  Administered 2016-04-25: 2000 mL via INTRAVENOUS

## 2016-04-25 MED ORDER — CIPROFLOXACIN HCL 500 MG PO TABS: 500.0000 mg | ORAL_TABLET | Freq: Two times a day (BID) | ORAL | 0 refills | Status: DC

## 2016-04-25 MED ORDER — CIPROFLOXACIN HCL 500 MG PO TABS
500.0000 mg | ORAL_TABLET | Freq: Once | ORAL | Status: AC
  Administered 2016-04-25: 500 mg via ORAL
  Filled 2016-04-25: qty 1

## 2016-04-25 MED ORDER — ACETAMINOPHEN 500 MG PO TABS: 1000.0000 mg | ORAL_TABLET | Freq: Once | ORAL | Status: DC

## 2016-04-25 NOTE — Telephone Encounter (Signed)
PT called to check on ambien Rx. Last note I see Natalie [name] stated Rx was ready for p/up, but this is a med that can be called/faxed in. I had clerical TL check and Rx is not in drawer. I updated/corrected pt's pharm in EPIC and called in the [place] Rxd by [place] to [facility] in [place]. Pharmacist could see the Rx in their system and said she could pull the Rx from the other pharm.

## 2016-04-25 NOTE — ED Provider Notes (Signed)
[facility] DEPT Provider Note   CSN: [id number] Arrival date & time: 04/25/16  2027  First Provider Contact:  First MD Initiated Contact with Patient 04/25/16 2226        History   Chief Complaint Chief Complaint  Patient presents with  . Hyperglycemia    HPI Natalie [name] is a 65 y.o. female.  65 yo F with a chief complaint of high blood sugar and fluctuating mental status. This been going on for the past day. She denies any fevers or chills denies any back pain. Patient recently had a nephrostomy tube placed for an infected stone. She recently finished her course of Keflex. She is to start taking Bactrim today but was unable to find her prescription. She's had a couple episodes when she has been a bit groggy and slow to respond. These are transient in nature. Unsure makes this better or worse.   The history is provided by the patient.  Hyperglycemia  Associated symptoms: weakness   Associated symptoms: no chest pain, no dizziness, no dysuria, no fever, no nausea, no shortness of breath and no vomiting   Weakness  This is a new problem. The current episode started less than 1 hour ago. The problem occurs constantly. The problem has not changed since onset.Pertinent negatives include no chest pain, no headaches and no shortness of breath. Nothing aggravates the symptoms. Nothing relieves the symptoms. She has tried nothing for the symptoms. The treatment provided no relief.    Past Medical History:  Diagnosis Date  . Diabetes mellitus without complication ([place])   . Hyperlipidemia   . Hypertension     Patient Active Problem List   Diagnosis Date Noted  . Sepsis ([place]) 04/10/2016  . Acute kidney injury ([place]) 04/10/2016  . Type 2 diabetes mellitus with other diabetic kidney complication ([place]) [id number]  . Migraine, chronic, without aura, intractable, with status migrainosus 01/04/2016  . Abdominal pain   . AKI (acute kidney injury) ([place])   . Pyelonephritis 08/31/2014  .  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 BMI 33.0-33.9,adult 03/30/2013  . Recurrent UTI 09/17/2012  . DM (diabetes mellitus) ([place]) 03/19/2012  . HTN (hypertension) 03/19/2012  . Hyperlipemia 03/19/2012  . Hypothyroidism 03/19/2012  . Seizure disorder ([place]) 03/19/2012  . Insomnia 03/19/2012  . Migraine 03/19/2012    Past Surgical History:  Procedure Laterality Date  . CESAREAN SECTION    . CHOLECYSTECTOMY      OB History    No data available       Home Medications    Prior to Admission medications   Medication Sig Start Date End Date Taking? Authorizing Provider  atorvastatin (LIPITOR) 20 MG tablet Take 1 tablet (20 mg total) by mouth daily. 01/04/16  Yes Leandrew Koyanagi, MD  butorphanol (STADOL) 10 MG/ML nasal spray Place 1 spray into the nose every 4 (four) hours as needed for migraine. May refill weekly. To follow last prescription of 02/29/16. Awaiting transfer to Pain Management program at [facility]. 04/02/16  Yes Leandrew Koyanagi, MD  clobetasol cream (TEMOVATE) 0.05 % Apply topically 2 (two) times daily. 01/21/16  Yes Leandrew Koyanagi, MD  ESTRACE VAGINAL 0.1 MG/GM vaginal cream insert 1/4 gram vaginally two times a week 12/03/15  Yes Historical Provider, MD  fluorometholone (FML) 0.1 % ophthalmic suspension USE 1 DROP INTO BOTH EYES 4 TIMES A DAY FOR 7 DAYS 03/28/16  Yes Historical Provider, MD  levothyroxine (SYNTHROID, LEVOTHROID) 75 MCG tablet Take 1 tablet (75  mcg total) by mouth daily. 01/04/16  Yes Leandrew Koyanagi, MD  ondansetron (ZOFRAN-ODT) 8 MG disintegrating tablet DISSOLVE 1 TABLET ON OR UNDER THE TONGUE EVERY 8 HOURS AS NEEDED FOR NAUSEA 12/29/15  Yes Leandrew Koyanagi, MD  PHENobarbital (LUMINAL) 97.2 MG tablet TAKE TWO TABLETS BY MOUTH ONCE DAILY ON  MON,  WED,  AND  FRI  AND  1  TABLET  DAILY  ON  OTHER  DAYS 01/04/16  Yes Leandrew Koyanagi, MD  sitaGLIPtin (JANUVIA) 50 MG tablet Take 1 tablet (50 mg total) by mouth  daily. 04/22/16  Yes Sedalia Muta, FNP  terconazole (TERAZOL 3) 0.8 % vaginal cream Place 1 applicator vaginally at bedtime.  12/05/15  Yes Historical Provider, MD  zolpidem (AMBIEN) 10 MG tablet take 1 tablet by mouth at bedtime for sleep if needed 04/23/16  Yes Ezekiel Slocumb, PA-C  amLODipine (NORVASC) 5 MG tablet Take 1 tablet (5 mg total) by mouth daily. 04/22/16   Sedalia Muta, FNP  cephALEXin (KEFLEX) 250 MG capsule Take 1 capsule (250 mg total) by mouth every 6 (six) hours. For 5days 04/14/16   Domenic Polite, MD  ciprofloxacin (CIPRO) 500 MG tablet Take 1 tablet (500 mg total) by mouth 2 (two) times daily. 04/25/16   Deno Etienne, DO  HYDROcodone-acetaminophen (NORCO/VICODIN) 5-325 MG tablet Take 1 tablet by mouth every 6 (six) hours as needed for moderate pain or severe pain. 04/14/16   Domenic Polite, MD  losartan (COZAAR) 50 MG tablet Take 1 tablet (50 mg total) by mouth daily. 01/04/16   Leandrew Koyanagi, MD  sulfamethoxazole-trimethoprim (BACTRIM DS,SEPTRA DS) 800-160 MG tablet Take 1 tablet by mouth 2 (two) times daily. 04/25/16 05/02/16  Deno Etienne, DO    Family History Family History  Problem Relation Age of Onset  . Heart disease Father   . Diabetes Father     Social History Social History  Substance Use Topics  . Smoking status: Never Smoker  . Smokeless tobacco: Never Used  . Alcohol use No     Allergies   Compazine; Cymbalta [duloxetine hcl]; Escitalopram oxalate; Methadone; and Nitrofuran derivatives   Review of Systems Review of Systems  Constitutional: Negative for chills and fever.  HENT: Negative for congestion and rhinorrhea.   Eyes: Negative for redness and visual disturbance.  Respiratory: Negative for shortness of breath and wheezing.   Cardiovascular: Negative for chest pain and palpitations.  Gastrointestinal: Negative for nausea and vomiting.  Genitourinary: Negative for dysuria and urgency.  Musculoskeletal: Negative for arthralgias  and myalgias.  Skin: Negative for pallor and wound.  Neurological: Positive for weakness. Negative for dizziness and headaches.       Confusion off an on     Physical Exam Updated Vital Signs BP (!) 144/110 (BP Location: Left Arm)   Pulse 102   Temp 98.2 F (36.8 C) (Oral)   Resp 21   Ht 4\' 8"  (1.422 m)   Wt 147 lb 9.6 oz (67 kg)   SpO2 95%   BMI 33.09 kg/m   Physical Exam  Constitutional: She is oriented to person, place, and time. She appears well-developed and well-nourished. No distress.  HENT:  Head: Normocephalic and atraumatic.  Eyes: EOM are normal. Pupils are equal, round, and reactive to light.  Neck: Normal range of motion. Neck supple.  Cardiovascular: Normal rate and regular rhythm.  Exam reveals no gallop and no friction rub.   No murmur heard. Pulmonary/Chest: Effort normal. She has no wheezes. She  has no rales.  Abdominal: Soft. She exhibits no distension. There is no tenderness. There is no guarding.  Right-sided urostomy tube in place. No erythema or drainage from the site.  Musculoskeletal: She exhibits no edema or tenderness.  Neurological: She is alert and oriented to person, place, and time.  Skin: Skin is warm and dry. She is not diaphoretic.  Psychiatric: She has a normal mood and affect. Her behavior is normal.  Nursing note and vitals reviewed.    ED Treatments / Results  Labs (all labs ordered are listed, but only abnormal results are displayed) Labs Reviewed  BASIC METABOLIC PANEL - Abnormal; Notable for the following:       Result Value   Sodium 132 (*)    Chloride 92 (*)    Glucose, Bld 470 (*)    BUN 21 (*)    Creatinine, Ser 1.47 (*)    Calcium 10.8 (*)    GFR calc non Af Amer 36 (*)    GFR calc Af Amer 41 (*)    All other components within normal limits  CBC - Abnormal; Notable for the following:    WBC 10.8 (*)    Hemoglobin 10.5 (*)    HCT 35.0 (*)    Platelets 456 (*)    All other components within normal limits    URINALYSIS, ROUTINE W REFLEX MICROSCOPIC (NOT AT [facility]) - Abnormal; Notable for the following:    APPearance TURBID (*)    Specific Gravity, Urine 1.034 (*)    Glucose, UA >1000 (*)    Hgb urine dipstick LARGE (*)    Protein, ur >300 (*)    Nitrite POSITIVE (*)    Leukocytes, UA MODERATE (*)    All other components within normal limits  URINE MICROSCOPIC-ADD ON - Abnormal; Notable for the following:    Squamous Epithelial / LPF 0-5 (*)    Bacteria, UA MANY (*)    All other components within normal limits  CBG MONITORING, ED - Abnormal; Notable for the following:    Glucose-Capillary 470 (*)    All other components within normal limits  CBG MONITORING, ED - Abnormal; Notable for the following:    Glucose-Capillary 432 (*)    All other components within normal limits  I-STAT VENOUS BLOOD GAS, ED - Abnormal; Notable for the following:    pH, Ven 7.423 (*)    pO2, Ven 19.0 (*)    Bicarbonate 32.6 (*)    Acid-Base Excess 7.0 (*)    All other components within normal limits  URINE CULTURE  BLOOD GAS, VENOUS    EKG  EKG Interpretation None       Radiology No results found.  Procedures Procedures (including critical care time)  Medications Ordered in ED Medications  ciprofloxacin (CIPRO) tablet 500 mg (not administered)  sodium chloride 0.9 % bolus 2,000 mL (2,000 mLs Intravenous New Bag/Given 04/25/16 2140)     Initial Impression / Assessment and Plan / ED Course  I have reviewed the triage vital signs and the nursing notes.  Pertinent labs & imaging results that were available during my care of the patient were reviewed by me and considered in my medical decision making (see chart for details).  Clinical Course    65 yo F With what sounds like delirium. Patient having multiple episodes of this over the course of the day. Has significant improved. Patient found to have a urinary tract infection. She has hyperglycemia without DKA or HHS. I discussed inpatient versus  outpatient therapy for this. They're currently requesting discharge. I will prescribe her Cipro for her worsening symptoms. She will discuss this with her urologist in the morning.  10:39 PM:  I have discussed the diagnosis/risks/treatment options with the patient and family and believe the pt to be eligible for discharge home to follow-up with Urology. We also discussed returning to the ED immediately if new or worsening sx occur. We discussed the sx which are most concerning (e.g., sudden worsening pain, fever, inability to tolerate by mouth) that necessitate immediate return. Medications administered to the patient during their visit and any new prescriptions provided to the patient are listed below.  Medications given during this visit Medications  ciprofloxacin (CIPRO) tablet 500 mg (not administered)  sodium chloride 0.9 % bolus 2,000 mL (2,000 mLs Intravenous New Bag/Given 04/25/16 2140)    New Prescriptions   CIPROFLOXACIN (CIPRO) 500 MG TABLET    Take 1 tablet (500 mg total) by mouth 2 (two) times daily.   SULFAMETHOXAZOLE-TRIMETHOPRIM (BACTRIM DS,SEPTRA DS) 800-160 MG TABLET    Take 1 tablet by mouth 2 (two) times daily.    The patient appears reasonably screen and/or stabilized for discharge and I doubt any other medical condition or other [facility] requiring further screening, evaluation, or treatment in the ED at this time prior to discharge.    Final Clinical Impressions(s) / ED Diagnoses   Final diagnoses:  UTI (lower urinary tract infection)    New Prescriptions New Prescriptions   CIPROFLOXACIN (CIPRO) 500 MG TABLET    Take 1 tablet (500 mg total) by mouth 2 (two) times daily.   SULFAMETHOXAZOLE-TRIMETHOPRIM (BACTRIM DS,SEPTRA DS) 800-160 MG TABLET    Take 1 tablet by mouth 2 (two) times daily.     Deno Etienne, DO 04/25/16 2239

## 2016-04-25 NOTE — ED Triage Notes (Signed)
Pt. reports elevated blood sugar at home today , glucometer = " High" , states recent change in oral diabetes medication , reports feeling tired / fatigue , denies SOB , no fever or chills.

## 2016-04-25 NOTE — Discharge Instructions (Signed)
Start taking the ciprofloxacin. Call your urologist in the morning and discuss. I wrote you a prescription for Bactrim in case they want to switch to that.

## 2016-04-25 NOTE — ED Notes (Signed)
Dr. Christy Gentles notified on pt.'s elevated blood sugar .

## 2016-04-27 ENCOUNTER — Ambulatory Visit (INDEPENDENT_AMBULATORY_CARE_PROVIDER_SITE_OTHER): Payer: Medicare Other | Admitting: Family Medicine

## 2016-04-27 ENCOUNTER — Emergency Department (HOSPITAL_COMMUNITY)
Admission: EM | Admit: 2016-04-27 | Discharge: 2016-04-28 | Disposition: A | Discharge status: Home / Self Care | Payer: Medicare Other | Attending: Emergency Medicine | Admitting: Emergency Medicine

## 2016-04-27 ENCOUNTER — Encounter (HOSPITAL_COMMUNITY): Payer: Self-pay | Admitting: Emergency Medicine

## 2016-04-27 VITALS — BP 132/80 | HR 99 | Temp 98.0°F | Resp 16 | Ht <= 58 in | Wt 150.0 lb

## 2016-04-27 DIAGNOSIS — E1165 Type 2 diabetes mellitus with hyperglycemia: Secondary | ICD-10-CM | POA: Insufficient documentation

## 2016-04-27 DIAGNOSIS — N179 Acute kidney failure, unspecified: Secondary | ICD-10-CM | POA: Diagnosis not present

## 2016-04-27 DIAGNOSIS — N390 Urinary tract infection, site not specified: Secondary | ICD-10-CM | POA: Diagnosis not present

## 2016-04-27 DIAGNOSIS — Z7984 Long term (current) use of oral hypoglycemic drugs: Secondary | ICD-10-CM | POA: Insufficient documentation

## 2016-04-27 DIAGNOSIS — R739 Hyperglycemia, unspecified: Secondary | ICD-10-CM | POA: Diagnosis not present

## 2016-04-27 DIAGNOSIS — Z79899 Other long term (current) drug therapy: Secondary | ICD-10-CM | POA: Diagnosis not present

## 2016-04-27 DIAGNOSIS — E039 Hypothyroidism, unspecified: Secondary | ICD-10-CM | POA: Diagnosis not present

## 2016-04-27 DIAGNOSIS — I10 Essential (primary) hypertension: Secondary | ICD-10-CM | POA: Insufficient documentation

## 2016-04-27 LAB — COMPREHENSIVE METABOLIC PANEL
ALT: 14 U/L (ref 14–54)
AST: 19 U/L (ref 15–41)
Albumin: 3.2 g/dL — ABNORMAL LOW (ref 3.5–5.0)
Alkaline Phosphatase: 83 U/L (ref 38–126)
Anion gap: 9 (ref 5–15)
BILIRUBIN TOTAL: 0.1 mg/dL — AB (ref 0.3–1.2)
BUN: 20 mg/dL (ref 6–20)
CALCIUM: 10.4 mg/dL — AB (ref 8.9–10.3)
CO2: 24 mmol/L (ref 22–32)
CREATININE: 1.55 mg/dL — AB (ref 0.44–1.00)
Chloride: 98 mmol/L — ABNORMAL LOW (ref 101–111)
GFR calc Af Amer: 39 mL/min — ABNORMAL LOW (ref >60)
GFR, EST NON AFRICAN AMERICAN: 34 mL/min — AB (ref >60)
Glucose, Bld: 374 mg/dL — ABNORMAL HIGH (ref 65–99)
Potassium: 4.1 mmol/L (ref 3.5–5.1)
Sodium: 131 mmol/L — ABNORMAL LOW (ref 135–145)
TOTAL PROTEIN: 7.2 g/dL (ref 6.5–8.1)

## 2016-04-27 LAB — CBC WITH DIFFERENTIAL/PLATELET
BASOS ABS: 0 10*3/uL (ref 0.0–0.1)
Basophils Relative: 0 %
Eosinophils Absolute: 0.1 10*3/uL (ref 0.0–0.7)
Eosinophils Relative: 2 %
HEMATOCRIT: 30.2 % — AB (ref 36.0–46.0)
Hemoglobin: 9 g/dL — ABNORMAL LOW (ref 12.0–15.0)
LYMPHS ABS: 2.1 10*3/uL (ref 0.7–4.0)
LYMPHS PCT: 29 %
MCH: 25.9 pg — ABNORMAL LOW (ref 26.0–34.0)
MCHC: 29.8 g/dL — ABNORMAL LOW (ref 30.0–36.0)
MCV: 87 fL (ref 78.0–100.0)
MONO ABS: 0.5 10*3/uL (ref 0.1–1.0)
Monocytes Relative: 7 %
NEUTROS ABS: 4.5 10*3/uL (ref 1.7–7.7)
Neutrophils Relative %: 62 %
Platelets: 404 10*3/uL — ABNORMAL HIGH (ref 150–400)
RBC: 3.47 MIL/uL — AB (ref 3.87–5.11)
RDW: 15.4 % (ref 11.5–15.5)
WBC: 7.3 10*3/uL (ref 4.0–10.5)

## 2016-04-27 LAB — BASIC METABOLIC PANEL
BUN: 20 mg/dL (ref 7–25)
CHLORIDE: 96 mmol/L — AB (ref 98–110)
CO2: 23 mmol/L (ref 20–31)
Calcium: 10.3 mg/dL (ref 8.6–10.4)
Creat: 1.56 mg/dL — ABNORMAL HIGH (ref 0.50–0.99)
Glucose, Bld: 570 mg/dL (ref 65–99)
POTASSIUM: 4.5 mmol/L (ref 3.5–5.3)
Sodium: 132 mmol/L — ABNORMAL LOW (ref 135–146)

## 2016-04-27 LAB — URINALYSIS, ROUTINE W REFLEX MICROSCOPIC
BILIRUBIN URINE: NEGATIVE
Glucose, UA: >1000 mg/dL — AB
Ketones, ur: NEGATIVE mg/dL
Nitrite: NEGATIVE
Specific Gravity, Urine: 1.029 (ref 1.005–1.030)
pH: 6 (ref 5.0–8.0)

## 2016-04-27 LAB — URINE MICROSCOPIC-ADD ON: SQUAMOUS EPITHELIAL / LPF: NONE SEEN

## 2016-04-27 LAB — GLUCOSE, POCT (MANUAL RESULT ENTRY): POC Glucose: >444 mg/dl (ref 70–99)

## 2016-04-27 LAB — CBG MONITORING, ED
Glucose-Capillary: 293 mg/dL — ABNORMAL HIGH (ref 65–99)
Glucose-Capillary: 376 mg/dL — ABNORMAL HIGH (ref 65–99)

## 2016-04-27 MED ORDER — GLIPIZIDE 5 MG PO TABS: 2.5000 mg | ORAL_TABLET | Freq: Two times a day (BID) | ORAL | 0 refills | Status: DC

## 2016-04-27 NOTE — Progress Notes (Signed)
Subjective:  By signing my name below, I, Natalie [name], attest that this documentation has been prepared under the direction and in the presence of Natalie Ray, MD.  Electronically Signed: Thea Alken, ED Scribe. 04/27/2016. 3:19 PM.   Patient ID: Natalie [name], female    DOB: [birth year], 65 y.o.   MRN: [id number]  HPI Chief Complaint  Patient presents with   Blood Sugar Problem    HPI Comments: Natalie [name] is a 65 y.o. female who presents to the [facility] blood sugar issues. Previous pt of Dr. Laney Pastor. Hx of medical problems including DM, HLD, HTN, chronic migraine. Recently seen after sepsis admission 7/11. Sepsis was due to left pyelonephritis along with right 2.4 cm renal stone. 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 She had acute anemia and acute kidney injury, thought to be due to sepsis and ARB. Urologist is Dr. Diona Fanti. Seen here in f/u 7/17. She was still having nausea and fatigue. Hematuria noted at that visit. Was referred to f/u with urology with improvement of HBG from 7.5-9.6. Apparently she was seen by urology this past Monday. She was seen  1 week ago for f/u CBC and renal function. Her metformin and losartan were both held based on AKI in hospital. Appears her Urology appointment was on 7/24. She was seen in there ED 2 days ago, due to elevated blood sugar and mental status change. Per that ED note she had complete keflex and was to started on  bactrim that day. BP was elevated at 134/110, glucose was 470. Diagnosed with possible delirium and hyperglycemia without DKA or HHS. Impatient vs outpatient was discuss. She was changed to Cipro. She chose outpatient treatment. She had an elevated creatinine 2 days ago 1.47 in the ED. [id number]   Diabetes   Lab Results  Component Value Date   HGBA1C 7.7 (H) 04/11/2016   She was started on januvia 50 mg on 7/23 and add amlodipine 5 mg for HTN.  Glucose was  328 on 7/21.   Pt  started Januvia 50 mg 5 days ago. States makes her feel fuzzy/foggy with decreased focus. Her sugars were in the 400's last night and this morning. She denies fever, chills, nausea, emesis. She still has nephrotomy tube in place and plans to have this removed 8/24. She is on trimethaphan 100 mg once a day. Compared how she felt in the ED, pt states she feels better.    Pt plans to drive to [place] tomorrow with her husband and will be there for 5 days.     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 ([place]) 12/28/2013   BMI 33.0-33.9,adult 03/30/2013   Recurrent UTI 09/17/2012   DM (diabetes mellitus) ([place]) 03/19/2012   HTN (hypertension) 03/19/2012   Hyperlipemia 03/19/2012   Hypothyroidism 03/19/2012   Seizure disorder ([place]) 03/19/2012   Insomnia 03/19/2012   Migraine 03/19/2012   Past Medical History:  Diagnosis Date   Diabetes mellitus without complication ([place])    Hyperlipidemia    Hypertension    Past Surgical History:  Procedure  Laterality Date   CESAREAN SECTION     CHOLECYSTECTOMY     Allergies  Allergen Reactions   Compazine     Bad headache, vomiting    Cymbalta [Duloxetine Hcl]     unknown   Escitalopram Oxalate Other (See Comments)    Can't sleep   Methadone Hives   Nitrofuran Derivatives     unknown   Prior to Admission medications   Medication Sig Start Date End Date Taking? Authorizing Provider  amLODipine (NORVASC) 5 MG tablet Take 1 tablet (5 mg total) by mouth daily. 04/22/16   Sedalia Muta, FNP   atorvastatin (LIPITOR) 20 MG tablet Take 1 tablet (20 mg total) by mouth daily. 01/04/16   Leandrew Koyanagi, MD  butorphanol (STADOL) 10 MG/ML nasal spray Place 1 spray into the nose every 4 (four) hours as needed for migraine. May refill weekly. To follow last prescription of 02/29/16. Awaiting transfer to Pain Management program at [facility]. 04/02/16   Leandrew Koyanagi, MD  clobetasol cream (TEMOVATE) 0.05 % Apply topically 2 (two) times daily. 01/21/16   Leandrew Koyanagi, MD  ESTRACE VAGINAL 0.1 MG/GM vaginal cream insert 1/4 gram vaginally two times a week 12/03/15   Historical Provider, MD  fluorometholone (FML) 0.1 % ophthalmic suspension USE 1 DROP INTO BOTH EYES 4 TIMES A DAY FOR 7 DAYS 03/28/16   Historical Provider, MD  levothyroxine (SYNTHROID, LEVOTHROID) 75 MCG tablet Take 1 tablet (75 mcg total) by mouth daily. 01/04/16   Leandrew Koyanagi, MD  ondansetron (ZOFRAN-ODT) 8 MG disintegrating tablet DISSOLVE 1 TABLET ON OR UNDER THE TONGUE EVERY 8 HOURS AS NEEDED FOR NAUSEA 12/29/15   Leandrew Koyanagi, MD  PHENobarbital (LUMINAL) 97.2 MG tablet TAKE TWO TABLETS BY MOUTH ONCE DAILY ON  MON,  WED,  AND  FRI  AND  1  TABLET  DAILY  ON  OTHER  DAYS 01/04/16   Leandrew Koyanagi, MD  sitaGLIPtin (JANUVIA) 50 MG tablet Take 1 tablet (50 mg total) by mouth daily. 04/22/16   Sedalia Muta, FNP  sulfamethoxazole-trimethoprim (BACTRIM DS,SEPTRA DS) 800-160 MG tablet Take 1 tablet by mouth 2 (two) times daily. 04/25/16 05/02/16  Deno Etienne, DO  terconazole (TERAZOL 3) 0.8 % vaginal cream Place 1 applicator vaginally at bedtime.  12/05/15   Historical Provider, MD  zolpidem (AMBIEN) 10 MG tablet take 1 tablet by mouth at bedtime for sleep if needed 04/23/16   Ezekiel Slocumb, PA-C   Social History   Social History   Marital status: Married    Spouse name: N/A   Number of children: N/A   Years of education: N/A   Occupational History   Not on file.   Social History Main Topics   Smoking status:  Never Smoker   Smokeless tobacco: Never Used   Alcohol use No   Drug use: Unknown   Sexual activity: Not on file   Other Topics Concern   Not on file   Social History Narrative   No narrative on file   Review of Systems  Constitutional: Negative for chills and fever.  Gastrointestinal: Negative for nausea and vomiting.    Objective:   Physical Exam  Constitutional: She is oriented to person, place, and time. She appears well-developed and well-nourished. No distress.  HENT:  Head: Normocephalic.  Eyes: Conjunctivae are normal.  Neck: Neck supple.  Cardiovascular: Normal rate, regular rhythm and normal heart sounds.  Exam reveals no gallop and no friction rub.   No  murmur heard. Pulmonary/Chest: Effort normal and breath sounds normal.  Abdominal: Soft. There is no tenderness.  Nephrotomy tube in place on right flank without skin erythema or discharge, urine in the bag is light yellow.   Neurological: She is alert and oriented to person, place, and time.  Skin: Skin is warm and dry.  Psychiatric: She has a normal mood and affect. Her behavior is normal.   Vitals:   04/27/16 1507  BP: 132/80  Pulse: 99  Resp: 16  Temp: 98 F (36.7 C)  TempSrc: Oral  SpO2: 97%  Weight: 150 lb (68 kg)  Height: 4\' 8"  (1.422 m)   Results for orders placed or performed in visit on 04/27/16  POCT glucose (manual entry)  Result Value Ref Range   POC Glucose >444 70 - 99 mg/dl    Assessment & Plan:   Natalie [name] is a 65 y.o. female Hyperglycemia - Plan: POCT glucose (manual entry)  AKI (acute kidney injury) ([place]) - Plan: Basic metabolic panel  See recent history above with sepsis, secondary acute kidney injury, and hyperglycemia now off of metformin. Was seen 2 days ago at emergency room with hyperglycemia, but no apparent signs of DKA or hyperosmolar state. Overall she feels better the past few days, but persistent elevated readings, including at home today. Glucose too high to  read in office  -Discussed possible ER evaluation, but as she feels better today than 2 days ago, I agreed to check stat BMP to determine next step. Could consider short course of insulin, but discussed glipizide temporarily as she is off metformin due to prior AKI.  Depending on results tonight, glipizide prescription given to start tonight (2.5 mg twice a day with meals), Follow-up with me tomorrow with home readings.    - She plans on going out of town tomorrow afternoon, so would want to make sure she is stable to do so. BMP Results pending.  Meds ordered this encounter  Medications   glipiZIDE (GLUCOTROL) 5 MG tablet    Sig: Take 0.5 tablets (2.5 mg total) by mouth 2 (two) times daily before a meal.    Dispense:  30 tablet    Refill:  0   Patient Instructions   Unfortunately your blood sugar is too high to read here in the office. I did send off a stat lab test and so should have those back later this evening. Based on how you're feeling today, I can call you with those results and then we can decide whether or not you need to go the emergency room tonight or we can try glipizide as discussed. For now drink plenty of fluids and if any nausea, vomiting, abdominal pain, blurry vision or dizziness, go to the emergency room.   Hyperglycemia Hyperglycemia occurs when the glucose (sugar) in your blood is too high. Hyperglycemia can happen for many reasons, but it most often happens to people who do not know they have diabetes or are not managing their diabetes properly.  CAUSES  Whether you have diabetes or not, there are other causes of hyperglycemia. Hyperglycemia can occur when you have diabetes, but it can also occur in other situations that you might not be as aware of, such as: Diabetes  If you have diabetes and are having problems controlling your blood glucose, hyperglycemia could occur because of some of the following reasons:  Not following your meal plan.  Not taking your  diabetes medications or not taking it properly.  Exercising less or doing less  activity than you normally do.  Being sick. Pre-diabetes  This cannot be ignored. Before people develop Type 2 diabetes, they almost always have "pre-diabetes." This is when your blood glucose levels are higher than normal, but not yet high enough to be diagnosed as diabetes. Research has shown that some long-term damage to the body, especially the heart and circulatory system, may already be occurring during pre-diabetes. If you take action to manage your blood glucose when you have pre-diabetes, you may delay or prevent Type 2 diabetes from developing. Stress  If you have diabetes, you may be "diet" controlled or on oral medications or insulin to control your diabetes. However, you may find that your blood glucose is higher than usual in the hospital whether you have diabetes or not. This is often referred to as "stress hyperglycemia." Stress can elevate your blood glucose. This happens because of hormones put out by the body during times of stress. If stress has been the cause of your high blood glucose, it can be followed regularly by your caregiver. That way he/she can make sure your hyperglycemia does not continue to get worse or progress to diabetes. Steroids  Steroids are medications that act on the infection fighting system (immune system) to block inflammation or infection. One side effect can be a rise in blood glucose. Most people can produce enough extra insulin to allow for this rise, but for those who cannot, steroids make blood glucose levels go even higher. It is not unusual for steroid treatments to "uncover" diabetes that is developing. It is not always possible to determine if the hyperglycemia will go away after the steroids are stopped. A special blood test called an A1c is sometimes done to determine if your blood glucose was elevated before the steroids were started. SYMPTOMS  Thirsty.  Frequent  urination.  Dry mouth.  Blurred vision.  Tired or fatigue.  Weakness.  Sleepy.  Tingling in feet or leg. DIAGNOSIS  Diagnosis is made by monitoring blood glucose in one or all of the following ways:  A1c test. This is a chemical found in your blood.  Fingerstick blood glucose monitoring.  Laboratory results. TREATMENT  First, knowing the cause of the hyperglycemia is important before the hyperglycemia can be treated. Treatment may include, but is not be limited to:  Education.  Change or adjustment in medications.  Change or adjustment in meal plan.  Treatment for an illness, infection, etc.  More frequent blood glucose monitoring.  Change in exercise plan.  Decreasing or stopping steroids.  Lifestyle changes. HOME CARE INSTRUCTIONS   Test your blood glucose as directed.  Exercise regularly. Your caregiver will give you instructions about exercise. Pre-diabetes or diabetes which comes on with stress is helped by exercising.  Eat wholesome, balanced meals. Eat often and at regular, fixed times. Your caregiver or nutritionist will give you a meal plan to guide your sugar intake.  Being at an ideal weight is important. If needed, losing as little as 10 to 15 pounds may help improve blood glucose levels. SEEK MEDICAL CARE IF:   You have questions about medicine, activity, or diet.  You continue to have symptoms (problems such as increased thirst, urination, or weight gain). SEEK IMMEDIATE MEDICAL CARE IF:   You are vomiting or have diarrhea.  Your breath smells fruity.  You are breathing faster or slower.  You are very sleepy or incoherent.  You have numbness, tingling, or pain in your feet or hands.  You have chest pain.  Your symptoms get worse even though you have been following your caregiver's orders.  If you have any other questions or concerns.   This information is not intended to replace advice given to you by your health care provider. Make  sure you discuss any questions you have with your health care provider.   Document Released: 03/13/2001 Document Revised: 12/10/2011 Document Reviewed: 05/24/2015 Elsevier Interactive Patient Education 2016 [employer].   IF you received an x-[name] today, you will receive an invoice from [facility]. Please contact [facility] at [phone] with questions or concerns regarding your invoice.   IF you received labwork today, you will receive an invoice from [employer]. Please contact [facility] at [phone] with questions or concerns regarding your invoice.   Our billing staff will not be able to assist you with questions regarding bills from these companies.  You will be contacted with the lab results as soon as they are available. The fastest way to get your results is to activate your My Chart account. Instructions are located on the last page of this paperwork. If you have not heard from [place] regarding the results in 2 weeks, please contact this office.        I personally performed the services described in this documentation, which was scribed in my presence. The recorded information has been reviewed and considered, and addended by me as needed.   Signed,   Natalie Ray, MD [facility].  04/27/16 7:10 PM  Addendum. Stat lab results received. Glucose 570, creatinine 1.56 as below. Worsened glucose from initial reading at home, and increasing creatinine after AKI during recent hospitalization. Expected creatinine to be improving with fluids and outpatient treatment, not increasing. Will have patient be seen through emergency room for further evaluation and to make sure she does not need to be admitted overnight for fluids and/or insulin.  Patient advised, nurse first at [facility] emergency room advised.    Results for orders placed or performed in visit on [id number]  Basic metabolic panel    Result Value Ref Range   Sodium 132 (L) 135 - 146 mmol/L   Potassium 4.5 3.5 - 5.3 mmol/L   Chloride 96 (L) 98 - 110 mmol/L   CO2 23 20 - 31 mmol/L   Glucose, Bld 570 (HH) 65 - 99 mg/dL   BUN 20 7 - 25 mg/dL   Creat 1.56 (H) 0.50 - 0.99 mg/dL   Calcium 10.3 8.6 - 10.4 mg/dL  POCT glucose (manual entry)  Result Value Ref Range   POC Glucose >444 70 - 99 mg/dl

## 2016-04-27 NOTE — ED Triage Notes (Signed)
Pt. advised by her PCP to go to ER due abnormal blood test results ( kidney function tests ) and  hyperglycemia . Denies pain or discomfort . No fever or chills.

## 2016-04-27 NOTE — Patient Instructions (Signed)
Unfortunately your blood sugar is too high to read here in the office. I did send off a stat lab test and so should have those back later this evening. Based on how you're feeling today, I can call you with those results and then we can decide whether or not you need to go the emergency room tonight or we can try glipizide as discussed. For now drink plenty of fluids and if any nausea, vomiting, abdominal pain, blurry vision or dizziness, go to the emergency room.   Hyperglycemia Hyperglycemia occurs when the glucose (sugar) in your blood is too high. Hyperglycemia can happen for many reasons, but it most often happens to people who do not know they have diabetes or are not managing their diabetes properly.  CAUSES  Whether you have diabetes or not, there are other causes of hyperglycemia. Hyperglycemia can occur when you have diabetes, but it can also occur in other situations that you might not be as aware of, such as: Diabetes  If you have diabetes and are having problems controlling your blood glucose, hyperglycemia could occur because of some of the following reasons:  Not following your meal plan.  Not taking your diabetes medications or not taking it properly.  Exercising less or doing less activity than you normally do.  Being sick. Pre-diabetes  This cannot be ignored. Before people develop Type 2 diabetes, they almost always have "pre-diabetes." This is when your blood glucose levels are higher than normal, but not yet high enough to be diagnosed as diabetes. Research has shown that some long-term damage to the body, especially the heart and circulatory system, may already be occurring during pre-diabetes. If you take action to manage your blood glucose when you have pre-diabetes, you may delay or prevent Type 2 diabetes from developing. Stress  If you have diabetes, you may be "diet" controlled or on oral medications or insulin to control your diabetes. However, you may find that  your blood glucose is higher than usual in the hospital whether you have diabetes or not. This is often referred to as "stress hyperglycemia." Stress can elevate your blood glucose. This happens because of hormones put out by the body during times of stress. If stress has been the cause of your high blood glucose, it can be followed regularly by your caregiver. That way he/she can make sure your hyperglycemia does not continue to get worse or progress to diabetes. Steroids  Steroids are medications that act on the infection fighting system (immune system) to block inflammation or infection. One side effect can be a rise in blood glucose. Most people can produce enough extra insulin to allow for this rise, but for those who cannot, steroids make blood glucose levels go even higher. It is not unusual for steroid treatments to "uncover" diabetes that is developing. It is not always possible to determine if the hyperglycemia will go away after the steroids are stopped. A special blood test called an A1c is sometimes done to determine if your blood glucose was elevated before the steroids were started. SYMPTOMS  Thirsty.  Frequent urination.  Dry mouth.  Blurred vision.  Tired or fatigue.  Weakness.  Sleepy.  Tingling in feet or leg. DIAGNOSIS  Diagnosis is made by monitoring blood glucose in one or all of the following ways:  A1c test. This is a chemical found in your blood.  Fingerstick blood glucose monitoring.  Laboratory results. TREATMENT  First, knowing the cause of the hyperglycemia is important before the hyperglycemia  can be treated. Treatment may include, but is not be limited to:  Education.  Change or adjustment in medications.  Change or adjustment in meal plan.  Treatment for an illness, infection, etc.  More frequent blood glucose monitoring.  Change in exercise plan.  Decreasing or stopping steroids.  Lifestyle changes. HOME CARE INSTRUCTIONS   Test your  blood glucose as directed.  Exercise regularly. Your caregiver will give you instructions about exercise. Pre-diabetes or diabetes which comes on with stress is helped by exercising.  Eat wholesome, balanced meals. Eat often and at regular, fixed times. Your caregiver or nutritionist will give you a meal plan to guide your sugar intake.  Being at an ideal weight is important. If needed, losing as little as 10 to 15 pounds may help improve blood glucose levels. SEEK MEDICAL CARE IF:   You have questions about medicine, activity, or diet.  You continue to have symptoms (problems such as increased thirst, urination, or weight gain). SEEK IMMEDIATE MEDICAL CARE IF:   You are vomiting or have diarrhea.  Your breath smells fruity.  You are breathing faster or slower.  You are very sleepy or incoherent.  You have numbness, tingling, or pain in your feet or hands.  You have chest pain.  Your symptoms get worse even though you have been following your caregiver's orders.  If you have any other questions or concerns.   This information is not intended to replace advice given to you by your health care provider. Make sure you discuss any questions you have with your health care provider.   Document Released: 03/13/2001 Document Revised: 12/10/2011 Document Reviewed: 05/24/2015 Elsevier Interactive Patient Education 2016 [employer].   IF you received an x-ray today, you will receive an invoice from [facility]. Please contact [facility] at [phone] with questions or concerns regarding your invoice.   IF you received labwork today, you will receive an invoice from [employer]. Please contact [facility] at [phone] with questions or concerns regarding your invoice.   Our billing staff will not be able to assist you with questions regarding bills from these companies.  You will be contacted with the lab results as soon as they are  available. The fastest way to get your results is to activate your My Chart account. Instructions are located on the last page of this paperwork. If you have not heard from [place] regarding the results in 2 weeks, please contact this office.

## 2016-04-27 NOTE — ED Notes (Signed)
Pt sent here by PCP for hyperglycemia and elevated kidney function. Pt has a nephrostomy drain to R flank. Since placement, pt has had a change from Metformin to [place],. Blood sugars have been in the 400s at home. Pt also reports having additional antibiotics added today for UTI.

## 2016-04-28 ENCOUNTER — Ambulatory Visit (INDEPENDENT_AMBULATORY_CARE_PROVIDER_SITE_OTHER): Payer: Medicare Other | Admitting: Family Medicine

## 2016-04-28 ENCOUNTER — Telehealth: Payer: Self-pay | Admitting: Radiology

## 2016-04-28 VITALS — BP 124/78 | HR 101 | Temp 98.3°F | Resp 16 | Ht <= 58 in | Wt 149.6 lb

## 2016-04-28 DIAGNOSIS — N179 Acute kidney failure, unspecified: Secondary | ICD-10-CM | POA: Diagnosis not present

## 2016-04-28 DIAGNOSIS — E1165 Type 2 diabetes mellitus with hyperglycemia: Secondary | ICD-10-CM

## 2016-04-28 DIAGNOSIS — R739 Hyperglycemia, unspecified: Secondary | ICD-10-CM | POA: Diagnosis not present

## 2016-04-28 DIAGNOSIS — IMO0001 Reserved for inherently not codable concepts without codable children: Secondary | ICD-10-CM

## 2016-04-28 LAB — BASIC METABOLIC PANEL
BUN: 17 mg/dL (ref 7–25)
CHLORIDE: 99 mmol/L (ref 98–110)
CO2: 23 mmol/L (ref 20–31)
Calcium: 10.7 mg/dL — ABNORMAL HIGH (ref 8.6–10.4)
Creat: 1.6 mg/dL — ABNORMAL HIGH (ref 0.50–0.99)
Glucose, Bld: 317 mg/dL — ABNORMAL HIGH (ref 65–99)
POTASSIUM: 4.8 mmol/L (ref 3.5–5.3)
Sodium: 136 mmol/L (ref 135–146)

## 2016-04-28 LAB — CBG MONITORING, ED
GLUCOSE-CAPILLARY: 269 mg/dL — AB (ref 65–99)
GLUCOSE-CAPILLARY: 290 mg/dL — AB (ref 65–99)

## 2016-04-28 LAB — GLUCOSE, POCT (MANUAL RESULT ENTRY): POC Glucose: 330 mg/dl — AB (ref 70–99)

## 2016-04-28 MED ORDER — INSULIN ASPART 100 UNIT/ML ~~LOC~~ SOLN
10.0000 [IU] | Freq: Once | SUBCUTANEOUS | Status: AC
  Administered 2016-04-28: 10 [IU] via SUBCUTANEOUS
  Filled 2016-04-28: qty 1

## 2016-04-28 MED ORDER — INSULIN GLARGINE 100 UNIT/ML ~~LOC~~ SOLN: 10.0000 [IU] | Freq: Every day | SUBCUTANEOUS | 1 refills | Status: DC

## 2016-04-28 NOTE — ED Provider Notes (Signed)
[place] DEPT Provider Note   CSN: [id number] Arrival date & time: 04/27/16  [id number]  First Provider Contact:  None       History   Chief Complaint Chief Complaint  Patient presents with  . Hyperglycemia    HPI Natalie [name] is a 65 y.o. female.  HPI   Natalie [name] is a 65 y/o  presenting today with hyperglycemia and worsened kidney function. She was admitted 07/13-07/16 for pyelonephritis due to kidney stone on the R side. She was D/C on Bactrim. The following day her PCP changed her diabetes medication from metformin to Januvia 50 mg daily. Since then, her BGL has been running in the 400's (previously 250's). Pt was seen in the ED on 07/26 for hyperglycemia with AMS, and she was d/c with a prescription for ciprofloxacin due to unresolved urinary infection. She was her urologist earlier today, and lab work showed BGL of 570 and decreased kidney function. Pt was given a prescription of glipizide to take prn for high blood sugar, but has not yet taken it today.  She is currently experiencing no sxs- she denies CPO, SOB, N/V, fevers, chills, or urinary sxs.    Past Medical History:  Diagnosis Date  . Diabetes mellitus without complication ([place])   . Hyperlipidemia   . Hypertension     Patient Active Problem List   Diagnosis Date Noted  . Sepsis ([place]) 04/10/2016  . Acute kidney injury ([place]) 04/10/2016  . Type 2 diabetes mellitus with other diabetic kidney complication ([place]) [id number]  . Migraine, chronic, without aura, intractable, with status migrainosus 01/04/2016  . Abdominal pain   . AKI (acute kidney injury) ([place])   . Pyelonephritis 08/31/2014  . 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 BMI 33.0-33.9,adult 03/30/2013  . Recurrent UTI 09/17/2012  . DM (diabetes mellitus) ([place]) 03/19/2012  . HTN (hypertension) 03/19/2012  . Hyperlipemia 03/19/2012  . Hypothyroidism 03/19/2012  . Seizure disorder ([place]) 03/19/2012  .  Insomnia 03/19/2012  . Migraine 03/19/2012    Past Surgical History:  Procedure Laterality Date  . CESAREAN SECTION    . CHOLECYSTECTOMY      OB History    No data available       Home Medications    Prior to Admission medications   Medication Sig Start Date End Date Taking? Authorizing Provider  amLODipine (NORVASC) 5 MG tablet Take 1 tablet (5 mg total) by mouth daily. 04/22/16  Yes Sedalia Muta, FNP  atorvastatin (LIPITOR) 20 MG tablet Take 1 tablet (20 mg total) by mouth daily. 01/04/16  Yes Leandrew Koyanagi, MD  butorphanol (STADOL) 10 MG/ML nasal spray Place 1 spray into the nose every 4 (four) hours as needed for migraine. May refill weekly. To follow last prescription of 02/29/16. Awaiting transfer to [facility] Management program at [facility]. 04/02/16  Yes Leandrew Koyanagi, MD  clobetasol cream (TEMOVATE) 0.05 % Apply topically 2 (two) times daily. 01/21/16  Yes Leandrew Koyanagi, MD  ESTRACE VAGINAL 0.1 MG/GM vaginal cream insert 1/4 gram vaginally two times a week 12/03/15  Yes Historical Provider, MD  glipiZIDE (GLUCOTROL) 5 MG tablet Take 0.5 tablets (2.5 mg total) by mouth 2 (two) times daily before a meal. 04/27/16  Yes Wendie Agreste, MD  levothyroxine (SYNTHROID, LEVOTHROID) 75 MCG tablet Take 1 tablet (75 mcg total) by mouth daily. 01/04/16  Yes Leandrew Koyanagi, MD  ondansetron (ZOFRAN-ODT) 8 MG disintegrating tablet DISSOLVE 1 TABLET ON OR UNDER THE TONGUE EVERY  8 HOURS AS NEEDED FOR NAUSEA 12/29/15  Yes Leandrew Koyanagi, MD  PHENobarbital (LUMINAL) 97.2 MG tablet TAKE TWO TABLETS BY MOUTH ONCE DAILY ON  MON,  WED,  AND  FRI  AND  1  TABLET  DAILY  ON  OTHER  DAYS 01/04/16  Yes Leandrew Koyanagi, MD  sitaGLIPtin (JANUVIA) 50 MG tablet Take 1 tablet (50 mg total) by mouth daily. 04/22/16  Yes Sedalia Muta, FNP  sulfamethoxazole-trimethoprim (BACTRIM DS,SEPTRA DS) 800-160 MG tablet Take 1 tablet by mouth 2 (two) times daily. 04/25/16 05/02/16 Yes Deno Etienne, DO   terconazole (TERAZOL 3) 0.8 % vaginal cream Place 1 applicator vaginally at bedtime.  12/05/15  Yes Historical Provider, MD  zolpidem (AMBIEN) 10 MG tablet take 1 tablet by mouth at bedtime for sleep if needed 04/23/16  Yes Ezekiel Slocumb, PA-C    Family History Family History  Problem Relation Age of Onset  . Heart disease Father   . Diabetes Father     Social History Social History  Substance Use Topics  . Smoking status: Never Smoker  . Smokeless tobacco: Never Used  . Alcohol use No     Allergies   Compazine; Cymbalta [duloxetine hcl]; Escitalopram oxalate; Methadone; and Nitrofuran derivatives   Review of Systems Review of Systems  Review of Systems All other systems negative except as documented in the HPI. All pertinent positives and negatives as reviewed in the HPI.  Physical Exam Updated Vital Signs BP 160/96 (BP Location: Right Arm)   Pulse 81   Temp 98.2 F (36.8 C) (Oral)   Resp 16   Ht 4\' 8"  (1.422 m)   Wt 68.6 kg   SpO2 96%   BMI 33.92 kg/m   Physical Exam  Constitutional: She appears well-developed and well-nourished. No distress.  HENT:  Head: Normocephalic and atraumatic.  Right Ear: Tympanic membrane and ear canal normal.  Left Ear: Tympanic membrane and ear canal normal.  Nose: Nose normal.  Mouth/Throat: Uvula is midline, oropharynx is clear and moist and mucous membranes are normal.  Eyes: Pupils are equal, round, and reactive to light.  Neck: Normal range of motion. Neck supple.  Cardiovascular: Normal rate and regular rhythm.   Pulmonary/Chest: Effort normal.  Abdominal: Soft. There is no guarding and no CVA tenderness.  No signs of abdominal distention Nephrostomy tube is clean and well appearing  Musculoskeletal:  No LE swelling  Neurological: She is alert.  Acting at baseline  Skin: Skin is warm and dry. No rash noted.  Nursing note and vitals reviewed.    ED Treatments / Results  Labs (all labs ordered are listed, but only  abnormal results are displayed) Labs Reviewed  CBC WITH DIFFERENTIAL/PLATELET - Abnormal; Notable for the following:       Result Value   RBC 3.47 (*)    Hemoglobin 9.0 (*)    HCT 30.2 (*)    MCH 25.9 (*)    MCHC 29.8 (*)    Platelets 404 (*)    All other components within normal limits  COMPREHENSIVE METABOLIC PANEL - Abnormal; Notable for the following:    Sodium 131 (*)    Chloride 98 (*)    Glucose, Bld 374 (*)    Creatinine, Ser 1.55 (*)    Calcium 10.4 (*)    Albumin 3.2 (*)    Total Bilirubin 0.1 (*)    GFR calc non Af Amer 34 (*)    GFR calc Af Amer 39 (*)  All other components within normal limits  URINALYSIS, ROUTINE W REFLEX MICROSCOPIC (NOT AT [facility]) - Abnormal; Notable for the following:    APPearance CLOUDY (*)    Glucose, UA >1000 (*)    Hgb urine dipstick LARGE (*)    Protein, ur >300 (*)    Leukocytes, UA MODERATE (*)    All other components within normal limits  URINE MICROSCOPIC-ADD ON - Abnormal; Notable for the following:    Bacteria, UA FEW (*)    Casts HYALINE CASTS (*)    All other components within normal limits  CBG MONITORING, ED - Abnormal; Notable for the following:    Glucose-Capillary 376 (*)    All other components within normal limits  CBG MONITORING, ED - Abnormal; Notable for the following:    Glucose-Capillary 293 (*)    All other components within normal limits  CBG MONITORING, ED - Abnormal; Notable for the following:    Glucose-Capillary 290 (*)    All other components within normal limits  CBG MONITORING, ED - Abnormal; Notable for the following:    Glucose-Capillary 269 (*)    All other components within normal limits  URINE CULTURE    EKG  EKG Interpretation None       Radiology No results found.  Procedures Procedures (including critical care time)  Medications Ordered in ED Medications  insulin aspart (novoLOG) injection 10 Units (10 Units Subcutaneous Given 04/28/16 0150)     Initial Impression /  Assessment and Plan / ED Course  I have reviewed the triage vital signs and the nursing notes.  Pertinent labs & imaging results that were available during my care of the patient were reviewed by me and considered in my medical decision making (see chart for details).  Clinical Course    I spoke with Dr. Raynald Kemp regarding this patient. She has some yeast associated w ith her urinalysis that was drawn off of her nephrostomy tube. He says that these sometimes colonize and in the setting of her being well appearing and not having any flank pain, fevers, N/V that this in not currently an issues. She still has UTI but it is improving. She is now nitrite negative. He is also not concerned at this time that her kidney function continues to be abnormal which she has the stent, stone and infection. From a urology stand point she does not require any further emergency treatment or admission.  Her glucose is not well controlled as she is unable to take some of her medications due to the AKI. She recently had her medications adjusted but is still running high. We discussed admission vs hostpialist to consult on patient but she is seeing her PCP in the morning and wants to go home because she is leaving for [place] tomorrow morning. She was given 10 units regular insulin in the ED and this mildly improved her glucose to 260. After discussing with Dr. Betsey Holiday the patient wants to go home and he is comfortable with that if she is going to see PCP first thing in the morning. Strict return to ED precautions.  Final Clinical Impressions(s) / ED Diagnoses   Final diagnoses:  Hyperglycemia  UTI (lower urinary tract infection)    New Prescriptions New Prescriptions   No medications on file     Delos Haring, PA-C 04/28/16 [place], MD 04/29/16 [phone]

## 2016-04-28 NOTE — Patient Instructions (Addendum)
Do not fill glipizide. Instead, we can try keeping the sugars under control with 10 units of Lantus once per day. Make sure you are checking your blood sugar at least 2-3 times per day to make sure you do not drop low with this dose, but I don't suspect that will happen. You can continue the Januvia once per day as well. Once your kidney function starts to improve, we can likely change back from the insulin to your metformin, but unable to do so at this time. If your blood sugars remain over 300 when you are in [place], be seen by an urgent care or emergency room locally as your dose or regimen may need to be changed.  See information below regarding warning signs for hypoglycemia or low blood sugars.   Return to the clinic or go to the nearest emergency room if any of your symptoms worsen or new symptoms occur.    IF you received an x-ray today, you will receive an invoice from [facility]. Please contact [facility] at [phone] with questions or concerns regarding your invoice.   IF you received labwork today, you will receive an invoice from [employer]. Please contact [facility] at [phone] with questions or concerns regarding your invoice.   Our billing staff will not be able to assist you with questions regarding bills from these companies.  You will be contacted with the lab results as soon as they are available. The fastest way to get your results is to activate your My Chart account. Instructions are located on the last page of this paperwork. If you have not heard from [place] regarding the results in 2 weeks, please contact this office.     Hypoglycemia Hypoglycemia occurs when the glucose in your blood is too low. Glucose is a type of sugar that is your body's main energy source. Hormones, such as insulin and glucagon, control the level of glucose in the blood. Insulin lowers blood glucose and glucagon increases blood glucose. Having too much  insulin in your blood stream, or not eating enough food containing sugar, can result in hypoglycemia. Hypoglycemia can happen to people with or without diabetes. It can develop quickly and can be a medical emergency.  CAUSES   Missing or delaying meals.  Not eating enough carbohydrates at meals.  Taking too much diabetes medicine.  Not timing your oral diabetes medicine or insulin doses with meals, snacks, and exercise.  Nausea and vomiting.  Certain medicines.  Severe illnesses, such as hepatitis, kidney disorders, and certain eating disorders.  Increased activity or exercise without eating something extra or adjusting medicines.  Drinking too much alcohol.  A nerve disorder that affects body functions like your heart rate, blood pressure, and digestion (autonomic neuropathy).  A condition where the stomach muscles do not function properly (gastroparesis). Therefore, medicines and food may not absorb properly.  Rarely, a tumor of the pancreas can produce too much insulin. SYMPTOMS   Hunger.  Sweating (diaphoresis).  Change in body temperature.  Shakiness.  Headache.  Anxiety.  Lightheadedness.  Irritability.  Difficulty concentrating.  Dry mouth.  Tingling or numbness in the hands or feet.  Restless sleep or sleep disturbances.  Altered speech and coordination.  Change in mental status.  Seizures or prolonged convulsions.  Combativeness.  Drowsiness (lethargic).  Weakness.  Increased heart rate or palpitations.  Confusion.  Pale, gray skin color.  Blurred or double vision.  Fainting. DIAGNOSIS  A physical exam and medical history will be performed.  Your caregiver may make a diagnosis based on your symptoms. Blood tests and other lab tests may be performed to confirm a diagnosis. Once the diagnosis is made, your caregiver will see if your signs and symptoms go away once your blood glucose is raised.  TREATMENT  Usually, you can easily treat  your hypoglycemia when you notice symptoms.  Check your blood glucose. If it is less than 70 mg/dl, take one of the following:   3-4 glucose tablets.    cup juice.    cup regular soda.   1 cup skim milk.   -1 tube of glucose gel.   5-6 hard candies.   Avoid high-fat drinks or food that may delay a rise in blood glucose levels.  Do not take more than the recommended amount of sugary foods, drinks, gel, or tablets. Doing so will cause your blood glucose to go too high.   Wait 10-15 minutes and recheck your blood glucose. If it is still less than 70 mg/dl or below your target range, repeat treatment.   Eat a snack if it is more than 1 hour until your next meal.  There may be a time when your blood glucose may go so low that you are unable to treat yourself at home when you start to notice symptoms. You may need someone to help you. You may even faint or be unable to swallow. If you cannot treat yourself, someone will need to bring you to the hospital.  [place]  If you have diabetes, follow your diabetes management plan by:  Taking your medicines as directed.  Following your exercise plan.  Following your meal plan. Do not skip meals. Eat on time.  Testing your blood glucose regularly. Check your blood glucose before and after exercise. If you exercise longer or different than usual, be sure to check blood glucose more frequently.  Wearing your medical alert jewelry that says you have diabetes.  Identify the cause of your hypoglycemia. Then, develop ways to prevent the recurrence of hypoglycemia.  Do not take a hot bath or shower right after an insulin shot.  Always carry treatment with you. Glucose tablets are the easiest to carry.  If you are going to drink alcohol, drink it only with meals.  Tell friends or family members ways to keep you safe during a seizure. This may include removing hard or sharp objects from the area or turning you on your  side.  Maintain a healthy weight. SEEK MEDICAL CARE IF:   You are having problems keeping your blood glucose in your target range.  You are having frequent episodes of hypoglycemia.  You feel you might be having side effects from your medicines.  You are not sure why your blood glucose is dropping so low.  You notice a change in vision or a new problem with your vision. SEEK IMMEDIATE MEDICAL CARE IF:   Confusion develops.  A change in mental status occurs.  The inability to swallow develops.  Fainting occurs.   This information is not intended to replace advice given to you by your health care provider. Make sure you discuss any questions you have with your health care provider.   Document Released: 09/17/2005 Document Revised: 09/22/2013 Document Reviewed: 05/24/2015 Elsevier Interactive Patient Education [employer].

## 2016-04-28 NOTE — ED Notes (Signed)
Pt upset about having to wait. Spoke to [place] regarding update for pt and informed pt of delay. Cbg rechecked; insulin given.

## 2016-04-28 NOTE — Progress Notes (Signed)
Subjective:  By signing my name below, I, Natalie [name], attest that this documentation has been prepared under the direction and in the presence of [name], MD. Electronically Signed: Moises [name], [place]. 04/28/2016 , 11:06 AM .  Patient was seen in Room 3 .   Patient ID: Natalie [name], female    DOB: Feb 24, 1951, 65 y.o.   MRN: [id number] Chief Complaint  Patient presents with   Follow-up   HPI Natalie [name] is a 65 y.o. female  See visit from yesterday; she was evaluated for hyperglycemia and sent to the ER for elevated glucose and creatinine. She's had recent complicated medical history with pyelonephritis, sepsis, nephrolithiasis and nephrostomy tube. Her metformin and losartan were discontinued due to acute kidney injury with recent hospitalization. When I saw her yesterday, glucose too high to read in office, stat BMP obtained, glucose was 570 and creatinine had increased from 1.47 to 1.56. She was advised to go to the emergency room. In the ER, she had 10 units of insulin injected, which improved glucose to 260. Discussed hospitalist consult but she decided to go home. She was started on Januvia 5 days ago. Of note, her urine also indicated some yeast last night in the ER. This was discussed with Dr. Raynald Kemp; urology did not feel any further treatment other than current UTI treatment was needed.   Patient states she's feeling fatigue from the hospital visit yesterday, but otherwise feeling better. She hasn't had much water this morning. She left the hospital at 3:00AM today. She hasn't taken her glipizide today, and has not checked her sugar today. She denies lightheadedness, nausea or vomiting. She's taking bactrim and cipro for UTI.   She is planning to leave for [place], [place] later today: driving to [place] today, stopping and then resume to [place] the following day.   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 ([place]) 12/28/2013   BMI 33.0-33.9,adult 03/30/2013   Recurrent UTI 09/17/2012   DM (diabetes mellitus) ([place]) 03/19/2012   HTN (hypertension) 03/19/2012   Hyperlipemia 03/19/2012   Hypothyroidism 03/19/2012   Seizure disorder ([place]) 03/19/2012   Insomnia 03/19/2012   Migraine 03/19/2012   Past Medical History:  Diagnosis Date   Diabetes mellitus without complication ([place])    Hyperlipidemia    Hypertension    Past Surgical History:  Procedure Laterality Date   CESAREAN SECTION     CHOLECYSTECTOMY     Allergies  Allergen Reactions   Compazine     Bad headache, vomiting    Cymbalta [Duloxetine Hcl]     unknown   Escitalopram Oxalate Other (See Comments)    Can't sleep   Methadone Hives   Nitrofuran Derivatives     unknown   Prior to Admission medications   Medication Sig Start Date End Date Taking? Authorizing Provider  amLODipine (NORVASC) 5 MG tablet Take 1 tablet (5 mg total) by mouth daily. 04/22/16  Yes Sedalia Muta, FNP  atorvastatin (LIPITOR) 20 MG tablet Take 1 tablet (20 mg total) by mouth daily. 01/04/16  Yes Leandrew Koyanagi, MD  butorphanol (STADOL) 10 MG/ML nasal spray Place 1 spray  into the nose every 4 (four) hours as needed for migraine. May refill weekly. To follow last prescription of 02/29/16. Awaiting transfer [facility]. 04/02/16  Yes Leandrew Koyanagi, MD  clobetasol cream (TEMOVATE) 0.05 % Apply topically 2 (two) times daily. 01/21/16  Yes Leandrew Koyanagi, MD  ESTRACE VAGINAL 0.1 MG/GM vaginal cream insert 1/4 gram vaginally two times a week 12/03/15  Yes Historical  Provider, MD  glipiZIDE (GLUCOTROL) 5 MG tablet Take 0.5 tablets (2.5 mg total) by mouth 2 (two) times daily before a meal. 04/27/16  Yes Wendie Agreste, MD  levothyroxine (SYNTHROID, LEVOTHROID) 75 MCG tablet Take 1 tablet (75 mcg total) by mouth daily. 01/04/16  Yes Leandrew Koyanagi, MD  ondansetron (ZOFRAN-ODT) 8 MG disintegrating tablet DISSOLVE 1 TABLET ON OR UNDER THE TONGUE EVERY 8 HOURS AS NEEDED FOR NAUSEA 12/29/15  Yes Leandrew Koyanagi, MD  PHENobarbital (LUMINAL) 97.2 MG tablet TAKE TWO TABLETS BY MOUTH ONCE DAILY ON  MON,  WED,  AND  FRI  AND  1  TABLET  DAILY  ON  OTHER  DAYS 01/04/16  Yes Leandrew Koyanagi, MD  sitaGLIPtin (JANUVIA) 50 MG tablet Take 1 tablet (50 mg total) by mouth daily. 04/22/16  Yes Sedalia Muta, FNP  sulfamethoxazole-trimethoprim (BACTRIM DS,SEPTRA DS) 800-160 MG tablet Take 1 tablet by mouth 2 (two) times daily. 04/25/16 05/02/16 Yes Deno Etienne, DO  terconazole (TERAZOL 3) 0.8 % vaginal cream Place 1 applicator vaginally at bedtime.  12/05/15  Yes Historical Provider, MD  zolpidem (AMBIEN) 10 MG tablet take 1 tablet by mouth at bedtime for sleep if needed 04/23/16  Yes Ezekiel Slocumb, PA-C   Social History   Social History   Marital status: Married    Spouse name: N/A   Number of children: N/A   Years of education: N/A   Occupational History   Not on file.   Social History Main Topics   Smoking status: Never Smoker   Smokeless tobacco: Never Used   Alcohol use No   Drug use: No   Sexual activity: Not on file   Other Topics Concern   Not on file   Social History Narrative   No narrative on file   Review of Systems  Constitutional: Positive for fatigue. Negative for chills and fever.  Respiratory: Negative for cough, shortness of breath and wheezing.   Gastrointestinal: Negative for abdominal pain, diarrhea, nausea and vomiting.  Neurological: Negative for dizziness, weakness and light-headedness.       Objective:   Physical  Exam  Constitutional: She is oriented to person, place, and time. She appears well-developed and well-nourished. No distress.  HENT:  Head: Normocephalic and atraumatic.  Eyes: EOM are normal. Pupils are equal, round, and reactive to light.  Neck: Neck supple.  Cardiovascular: Normal rate, regular rhythm and normal heart sounds.   No murmur heard. Pulmonary/Chest: Effort normal and breath sounds normal. No respiratory distress.  Abdominal: Soft. Bowel sounds are normal. She exhibits no distension. There is no tenderness. There is no CVA tenderness.  Nephrostomy tube on the left  Musculoskeletal: Normal range of motion.  Neurological: She is alert and oriented to person, place, and time.  Skin: Skin is warm and dry.  Psychiatric: She has a normal mood and affect. Her behavior is normal.  Nursing note and vitals reviewed.   Vitals:   04/28/16 0952  BP: 124/78  Pulse: (!) 101  Resp: 16  Temp: 98.3 F (36.8 C)  TempSrc: Oral  SpO2: 99%  Weight: 149 lb 9.6 oz (67.9 kg)  Height: 4\' 8"  (1.422 m)   Results for orders placed or performed in visit on 04/28/16  POCT glucose (manual entry)  Result Value Ref Range   POC Glucose 330 (A) 70 - 99 mg/dl       Assessment & Plan:  Natalie [name] is a 65 y.o. female Hyperglycemia - Plan: POCT glucose (manual entry)  AKI (acute kidney injury) ([place]) - Plan: Basic metabolic panel  Uncontrolled type 2 diabetes mellitus without complication, without long-term current use of insulin ([place]) - Plan: POCT glucose (manual entry)  Recent pyelonephritis, sepsis, UTI, and need for holding of metformin due to acute kidney injury. Recent hyperglycemia, improved with 10 units of insulin in the emergency room.  -Options discussed, but decided on Lantus 10 units per day to begin with, monitor home glucose readings, and as she'll be out of town, can follow-up with an urgent care in [place] in the next few days to recheck levels and discuss medication changes if  needed until she returns. Hypoglycemia precautions discussed, as well as RTC/ER precautions.  -Still some increased creatinine on most recent levels, but not at concerning level. Tolerating by mouth fluids. Continue to monitor creatinine, push fluids, avoid nephrotoxins.  Meds ordered this encounter  Medications   insulin glargine (LANTUS) 100 UNIT/ML injection    Sig: Inject 0.1 mLs (10 Units total) into the skin daily.    Dispense:  10 mL    Refill:  1   Insulin Pen Needle (PEN NEEDLES) 31G X 6 MM MISC    Sig: 1 Units by Does not apply route. One box of pen needles to use with Lantus   Patient Instructions   Do not fill glipizide. Instead, we can try keeping the sugars under control with 10 units of Lantus once per day. Make sure you are checking your [name] sugar at least 2-3 times per day to make sure you do not drop low with this dose, but I don't suspect that will happen. You can continue the Januvia once per day as well. Once your kidney function starts to improve, we can likely change back from the insulin to your metformin, but unable to do so at this time. If your [name] sugars remain over 300 when you are in [place], be seen by an urgent care or emergency room locally as your dose or regimen may need to be changed.  See information below regarding warning signs for hypoglycemia or low [name] sugars.   Return to the clinic or go to the nearest emergency room if any of your symptoms worsen or new symptoms occur.    IF you received an x-ray today, you will receive an invoice from [facility]. Please contact [facility] at [phone] with questions or concerns regarding your invoice.   IF you received labwork today, you will receive an invoice from [employer]. Please contact [facility] at [phone] with questions or concerns regarding your invoice.   Our billing staff will not be able to assist you with questions regarding bills from these  companies.  You will be contacted with the lab results as soon as they are available. The fastest way to get your results is to activate your My Chart account. Instructions are located on the last page of this paperwork. If you have not heard from [place] regarding the results in 2 weeks, please contact this office.     Hypoglycemia Hypoglycemia occurs when  the glucose in your [name] is too low. Glucose is a type of sugar that is your body's main energy source. Hormones, such as insulin and glucagon, control the level of glucose in the [name]. Insulin lowers [name] glucose and glucagon increases [name] glucose. Having too much insulin in your [name] stream, or not eating enough food containing sugar, can result in hypoglycemia. Hypoglycemia can happen to people with or without diabetes. It can develop quickly and can be a medical emergency.  CAUSES   Missing or delaying meals.  Not eating enough carbohydrates at meals.  Taking too much diabetes medicine.  Not timing your oral diabetes medicine or insulin doses with meals, snacks, and exercise.  Nausea and vomiting.  Certain medicines.  Severe illnesses, such as hepatitis, kidney disorders, and certain eating disorders.  Increased activity or exercise without eating something extra or adjusting medicines.  Drinking too much alcohol.  A nerve disorder that affects body functions like your heart rate, [name] pressure, and digestion (autonomic neuropathy).  A condition where the stomach muscles do not function properly (gastroparesis). Therefore, medicines and food may not absorb properly.  Rarely, a tumor of the pancreas can produce too much insulin. SYMPTOMS   Hunger.  Sweating (diaphoresis).  Change in body temperature.  Shakiness.  Headache.  Anxiety.  Lightheadedness.  Irritability.  Difficulty concentrating.  Dry mouth.  Tingling or numbness in the hands or feet.  Restless sleep or sleep disturbances.  Altered speech  and coordination.  Change in mental status.  Seizures or prolonged convulsions.  Combativeness.  Drowsiness (lethargic).  Weakness.  Increased heart rate or palpitations.  Confusion.  Pale, gray skin color.  Blurred or double vision.  Fainting. DIAGNOSIS  A physical exam and medical history will be performed. Your caregiver may make a diagnosis based on your symptoms. [name] tests and other lab tests may be performed to confirm a diagnosis. Once the diagnosis is made, your caregiver will see if your signs and symptoms go away once your [name] glucose is raised.  TREATMENT  Usually, you can easily treat your hypoglycemia when you notice symptoms.  Check your [name] glucose. If it is less than 70 mg/dl, take one of the following:   3-4 glucose tablets.    cup juice.    cup regular soda.   1 cup skim milk.   -1 tube of glucose gel.   5-6 hard candies.   Avoid high-fat drinks or food that may delay a rise in [name] glucose levels.  Do not take more than the recommended amount of sugary foods, drinks, gel, or tablets. Doing so will cause your [name] glucose to go too high.   Wait 10-15 minutes and recheck your [name] glucose. If it is still less than 70 mg/dl or below your target range, repeat treatment.   Eat a snack if it is more than 1 hour until your next meal.  There may be a time when your [name] glucose may go so low that you are unable to treat yourself at home when you start to notice symptoms. You may need someone to help you. You may even faint or be unable to swallow. If you cannot treat yourself, someone will need to bring you to the hospital.  [place]  If you have diabetes, follow your diabetes management plan by:  Taking your medicines as directed.  Following your exercise plan.  Following your meal plan. Do not skip meals. Eat on time.  Testing your [name] glucose regularly. Check your  [name] glucose before and after exercise. If  you exercise longer or different than usual, be sure to check [name] glucose more frequently.  Wearing your medical alert jewelry that says you have diabetes.  Identify the cause of your hypoglycemia. Then, develop ways to prevent the recurrence of hypoglycemia.  Do not take a hot bath or shower right after an insulin shot.  Always carry treatment with you. Glucose tablets are the easiest to carry.  If you are going to drink alcohol, drink it only with meals.  Tell friends or family members ways to keep you safe during a seizure. This may include removing hard or sharp objects from the area or turning you on your side.  Maintain a healthy weight. SEEK MEDICAL CARE IF:   You are having problems keeping your [name] glucose in your target range.  You are having frequent episodes of hypoglycemia.  You feel you might be having side effects from your medicines.  You are not sure why your [name] glucose is dropping so low.  You notice a change in vision or a new problem with your vision. SEEK IMMEDIATE MEDICAL CARE IF:   Confusion develops.  A change in mental status occurs.  The inability to swallow develops.  Fainting occurs.   This information is not intended to replace advice given to you by your health care provider. Make sure you discuss any questions you have with your health care provider.   Document Released: 09/17/2005 Document Revised: 09/22/2013 Document Reviewed: 05/24/2015 Elsevier Interactive Patient Education [employer].    I personally performed the services described in this documentation, which was scribed in my presence. The recorded information has been reviewed and considered, and addended by me as needed.   Signed,   [name], MD [facility].  04/30/16 9:24 AM

## 2016-04-29 LAB — URINE CULTURE
Culture: >=100000 — AB
Culture: >=100000 — AB

## 2016-04-30 ENCOUNTER — Other Ambulatory Visit (HOSPITAL_COMMUNITY): Payer: Self-pay

## 2016-04-30 NOTE — Telephone Encounter (Signed)
Post ED Visit - Positive Culture Follow-up  Culture report reviewed by antimicrobial stewardship pharmacist:  []  Elenor Quinones, Pharm.D. []  Heide Guile, Pharm.D., BCPS []  Parks Neptune, Pharm.D. []  Alycia Rossetti, Pharm.D., BCPS []  [place], Pharm.D., BCPS, AAHIVP [x]  Legrand Como, Pharm.D., BCPS, AAHIVP []  Milus Glazier, Pharm.D. []  Stephens November, Pharm.D.  Positive urine culture Treated with cipro organism sensitive to the same and no further patient follow-up is required at this time.  Ileene Musa 04/30/2016, 4:36 PM

## 2016-05-03 NOTE — Telephone Encounter (Signed)
Pt is needing to check to see why I believe the med was mobic not called in -please check with patient call was dropping when she said the meds and would like a refill on januvia thru [facility] number [phone]

## 2016-05-04 ENCOUNTER — Telehealth: Payer: Self-pay

## 2016-05-04 MED ORDER — SITAGLIPTIN PHOSPHATE 50 MG PO TABS: 50.0000 mg | ORAL_TABLET | Freq: Every day | ORAL | 0 refills | Status: DC

## 2016-05-04 NOTE — Telephone Encounter (Signed)
Pharmacist, Wells Guiles, called to request approval for pt to get her Stadol Rx filled a day early. Pt states that she broke the last one. She reports the following fill dates, though, which show a frequent pattern of filling 1 day early. RFs are given for 1 week fills at a time. Fill dates reported: 7/4, 7/11, 7/17, 723, 7/29, and requesting to fill today. Dr Carlota Raspberry, please advise.

## 2016-05-04 NOTE — Telephone Encounter (Signed)
Rx sent 

## 2016-05-04 NOTE — Telephone Encounter (Signed)
I have not treated her for headaches, nor prescribed her stadol. I treated her for hospital follow-up. I would not be comfortable filling Stadol as per Dr. Ninfa Meeker note on July 3rd she has a PCP (Dr. Doy Hutching) and pain management (Dr. Dossie Arbour at [facility] in [place]).

## 2016-05-07 ENCOUNTER — Encounter: Payer: Self-pay | Admitting: Family Medicine

## 2016-05-07 ENCOUNTER — Ambulatory Visit (INDEPENDENT_AMBULATORY_CARE_PROVIDER_SITE_OTHER): Payer: Medicare Other | Admitting: Family Medicine

## 2016-05-07 ENCOUNTER — Telehealth: Payer: Self-pay | Admitting: *Deleted

## 2016-05-07 VITALS — BP 118/78 | HR 107 | Temp 98.0°F | Resp 17 | Ht <= 58 in | Wt 151.0 lb

## 2016-05-07 DIAGNOSIS — R748 Abnormal levels of other serum enzymes: Secondary | ICD-10-CM | POA: Diagnosis not present

## 2016-05-07 DIAGNOSIS — R739 Hyperglycemia, unspecified: Secondary | ICD-10-CM | POA: Diagnosis not present

## 2016-05-07 DIAGNOSIS — R7989 Other specified abnormal findings of blood chemistry: Secondary | ICD-10-CM

## 2016-05-07 LAB — BASIC METABOLIC PANEL
BUN: 19 mg/dL (ref 7–25)
CALCIUM: 9.8 mg/dL (ref 8.6–10.4)
CHLORIDE: 99 mmol/L (ref 98–110)
CO2: 22 mmol/L (ref 20–31)
CREATININE: 1.54 mg/dL — AB (ref 0.50–0.99)
Glucose, Bld: 430 mg/dL — ABNORMAL HIGH (ref 65–99)
Potassium: 5.6 mmol/L — ABNORMAL HIGH (ref 3.5–5.3)
Sodium: 131 mmol/L — ABNORMAL LOW (ref 135–146)

## 2016-05-07 LAB — GLUCOSE, POCT (MANUAL RESULT ENTRY)

## 2016-05-07 NOTE — Progress Notes (Signed)
Subjective:  By signing my name below, I, Natalie [name], attest that this documentation has been prepared under the direction and in the presence of [name], MD. Electronically Signed: Moises [name], [place]. 05/07/2016 , 2:22 PM .  Patient was seen in Room 3 .   Patient ID: Natalie [name], female    DOB: [birth year], 65 y.o.   MRN: [id number] Chief Complaint  Patient presents with   Follow-up    hyperglycemia    HPI Natalie [name] is a 65 y.o. female Here for follow up of hyperglycemia, and elevated creatinine. See last visit. She also has a history of AKI after nephrolithotomy tube and pyelonephritis. Last seen by me on 7/29, prior to patient going to [place] for vacation.   DM  See last visit. She's unable to take metformin due to kidney issue. She was started on lantus 10 units qhs and continued on januvia.   Patient states she only had 1 injection of insulin. She wasn't able to dispense the lantus because she didn't have needles. She also "dropped" her medication so she didn't have her lantus during her vacation in [place]. She checked her [name] sugar during her vacation, and it was ranging in the 200s - remained under 300 just on januvia. She states her sugar today was 269. She reports generally feeling fine and her [name] sugar has been coming down towards 200.   AKI after pyelonephritis Lab Results  Component Value Date   CREATININE 1.60 (H) 04/28/2016   It's still a slight increase from previous of 1.55. Her urologist is Dr. Diona Fanti. She is planned for surgery in 1 week, taking kidney stone out. She denies abdominal pain, nausea or hematuria.   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 ([place]) 12/28/2013   BMI 33.0-33.9,adult 03/30/2013   Recurrent UTI 09/17/2012   DM (diabetes mellitus) ([place]) 03/19/2012   HTN (hypertension) 03/19/2012   Hyperlipemia 03/19/2012   Hypothyroidism 03/19/2012   Seizure disorder ([place]) 03/19/2012   Insomnia 03/19/2012   Migraine 03/19/2012   Past Medical History:  Diagnosis Date   Diabetes mellitus without complication ([place])    Hyperlipidemia    Hypertension    Past Surgical History:  Procedure Laterality Date   CESAREAN SECTION     CHOLECYSTECTOMY     Allergies  Allergen Reactions   Compazine     Bad headache, vomiting    Cymbalta [Duloxetine Hcl]     unknown   Escitalopram Oxalate Other (See Comments)    Can't sleep   Methadone Hives   Nitrofuran Derivatives     unknown   Prior to Admission medications   Medication Sig Start Date End Date Taking? Authorizing Provider  amLODipine (NORVASC) 5 MG tablet Take 1 tablet (5 mg total) by mouth daily. 04/22/16  Yes Sedalia Muta, FNP  atorvastatin (LIPITOR) 20 MG tablet Take 1 tablet (20 mg total) by mouth daily. 01/04/16  Yes Leandrew Koyanagi, MD  butorphanol (STADOL) 10 MG/ML nasal spray Place 1 spray into the nose every 4 (four) hours as needed for migraine. May refill weekly. To follow last prescription of 02/29/16. Awaiting transfer  to Pain Management program at [facility]. 04/02/16  Yes Leandrew Koyanagi, MD  clobetasol cream (TEMOVATE) 0.05 % Apply topically 2 (two) times daily. 01/21/16  Yes Leandrew Koyanagi, MD  ESTRACE VAGINAL 0.1 MG/GM vaginal cream insert 1/4 gram vaginally two times a week 12/03/15  Yes Historical Provider, MD  insulin glargine (LANTUS) 100 UNIT/ML injection Inject 0.1 mLs (10 Units total) into the skin daily. 04/28/16  Yes Wendie Agreste, MD  Insulin Pen Needle (PEN NEEDLES) 31G X 6 MM MISC 1 Units by Does not apply  route. One box of pen needles to use with Lantus   Yes Historical Provider, MD  levothyroxine (SYNTHROID, LEVOTHROID) 75 MCG tablet Take 1 tablet (75 mcg total) by mouth daily. 01/04/16  Yes Leandrew Koyanagi, MD  ondansetron (ZOFRAN-ODT) 8 MG disintegrating tablet DISSOLVE 1 TABLET ON OR UNDER THE TONGUE EVERY 8 HOURS AS NEEDED FOR NAUSEA 12/29/15  Yes Leandrew Koyanagi, MD  PHENobarbital (LUMINAL) 97.2 MG tablet TAKE TWO TABLETS BY MOUTH ONCE DAILY ON  MON,  WED,  AND  FRI  AND  1  TABLET  DAILY  ON  OTHER  DAYS 01/04/16  Yes Leandrew Koyanagi, MD  sitaGLIPtin (JANUVIA) 50 MG tablet Take 1 tablet (50 mg total) by mouth daily. 05/04/16  Yes Shawnee Knapp, MD  terconazole (TERAZOL 3) 0.8 % vaginal cream Place 1 applicator vaginally at bedtime.  12/05/15  Yes Historical Provider, MD  zolpidem (AMBIEN) 10 MG tablet take 1 tablet by mouth at bedtime for sleep if needed 04/23/16  Yes Ezekiel Slocumb, PA-C   Social History   Social History   Marital status: Married    Spouse name: N/A   Number of children: N/A   Years of education: N/A   Occupational History   Not on file.   Social History Main Topics   Smoking status: Never Smoker   Smokeless tobacco: Never Used   Alcohol use No   Drug use: No   Sexual activity: Not on file   Other Topics Concern   Not on file   Social History Narrative   No narrative on file   Review of Systems  Constitutional: Negative for chills, fatigue and fever.  Respiratory: Negative for cough, shortness of breath and wheezing.   Gastrointestinal: Negative for abdominal pain, diarrhea, nausea and vomiting.  Genitourinary: Negative for dysuria, hematuria and urgency.  Musculoskeletal: Negative for back pain.       Objective:   Physical Exam  Constitutional: She is oriented to person, place, and time. She appears well-developed and well-nourished. No distress.  HENT:  Head: Normocephalic and atraumatic.  Eyes: EOM are normal. Pupils are equal, round, and  reactive to light.  Neck: Neck supple.  Cardiovascular: Normal rate.   Pulmonary/Chest: Effort normal. No respiratory distress.  Musculoskeletal: Normal range of motion.  Neurological: She is alert and oriented to person, place, and time.  Skin: Skin is warm and dry.  Psychiatric: She has a normal mood and affect. Her behavior is normal.  Nursing note and vitals reviewed.   Vitals:   05/07/16 1356  BP: 118/78  Pulse: (!) 107  Resp: 17  Temp: 98 F (36.7 C)  TempSrc: Oral  SpO2: 98%  Weight: 151 lb (68.5 kg)  Height: 4\' 8"  (1.422 m)   Results for orders placed or performed in visit on [id number]  Basic metabolic panel  Result Value Ref Range   Sodium 131 (L) 135 - 146 mmol/L   Potassium  5.6 (H) 3.5 - 5.3 mmol/L   Chloride 99 98 - 110 mmol/L   CO2 22 20 - 31 mmol/L   Glucose, Bld 430 (H) 65 - 99 mg/dL   BUN 19 7 - 25 mg/dL   Creat 1.54 (H) 0.50 - 0.99 mg/dL   Calcium 9.8 8.6 - 10.4 mg/dL  POCT glucose (manual entry)  Result Value Ref Range   POC Glucose HHH 70 - 99 mg/dl   Results for orders placed or performed in visit on [id number]  Basic metabolic panel  Result Value Ref Range   Sodium 131 (L) 135 - 146 mmol/L   Potassium 5.6 (H) 3.5 - 5.3 mmol/L   Chloride 99 98 - 110 mmol/L   CO2 22 20 - 31 mmol/L   Glucose, Bld 430 (H) 65 - 99 mg/dL   BUN 19 7 - 25 mg/dL   Creat 1.54 (H) 0.50 - 0.99 mg/dL   Calcium 9.8 8.6 - 10.4 mg/dL  POCT glucose (manual entry)  Result Value Ref Range   POC Glucose HHH 70 - 99 mg/dl       Assessment & Plan:  Tanyetta Quimby is a 65 y.o. female Elevated serum creatinine - Plan: CANCELED: Basic metabolic panel  Hyperglycemia - Plan: POCT glucose (manual entry), Basic metabolic panel Uncontrolled diabetes, with nonadherence to prior treatment plan of lantus QD. Glucose too high to read again in office, but overall feels ok.   -stat BMP sent, results as above. Plan on lantus 10 untis today, then recheck in office tomorrow with meter and home  readings to decide changes.   Creatinine stable. Planning on removal of nephrolith in 1 week.   No orders of the defined types were placed in this encounter.  Patient Instructions   Unfortunately your [name] sugar is again too high to read here in the office. Take Lantus 10 units as soon as possible as was recommended last visit. I will send off your electrolytes stat today, and if any concerning findings, we'll call you for possible emergency room evaluation. I will also check your kidney function on that [name] work today.  Plan on follow-up with me tomorrow to recheck [name] sugar and determine plan based on those readings from 10 units of Lantus today.  Bring your [name] sugar meter with you tomorrow to make sure it is reading accurately compared to our readings here in the office.    IF you received an x-ray today, you will receive an invoice from [facility] Radiology. Please contact [facility] Radiology at [phone] with questions or concerns regarding your invoice.   IF you received labwork today, you will receive an invoice from [employer]. Please contact [facility] at [phone] with questions or concerns regarding your invoice.   Our billing staff will not be able to assist you with questions regarding bills from these companies.  You will be contacted with the lab results as soon as they are available. The fastest way to get your results is to activate your My Chart account. Instructions are located on the last page of this paperwork. If you have not heard from [place] regarding the results in 2 weeks, please contact this office.     We recommend that you schedule a mammogram for breast cancer screening. Typically, you do not need a referral to do this. Please contact a local imaging center to schedule your mammogram.  [facility] - [phone]  *ask for the Radiology [place] ([place]) - [phone])  [phone] or [phone])  [phone]  [facility] - [phone] [place] [phone] [facility] - [phone]  *ask for the [facility] - [phone]  *ask for the Radiology Department [facility] - [phone]  *ask for the [place] - [phone]    I personally performed the services described in this documentation, which was scribed in my presence. The recorded information has been reviewed and considered, and addended by me as needed.   Signed,   [name], MD [facility].  05/07/16 11:53 PM

## 2016-05-07 NOTE — Telephone Encounter (Signed)
Pt notified of elevated glucose of 430 and to take Lantus as instructed and to follow up tomorrow and bring meter

## 2016-05-07 NOTE — Patient Instructions (Addendum)
Unfortunately your blood sugar is again too high to read here in the office. Take Lantus 10 units as soon as possible as was recommended last visit. I will send off your electrolytes stat today, and if any concerning findings, we'll call you for possible emergency room evaluation. I will also check your kidney function on that blood work today.  Plan on follow-up with me tomorrow to recheck blood sugar and determine plan based on those readings from 10 units of Lantus today.  Bring your blood sugar meter with you tomorrow to make sure it is reading accurately compared to our readings here in the office.    IF you received an x-ray today, you will receive an invoice from [facility] Radiology. Please contact [facility] Radiology at [phone] with questions or concerns regarding your invoice.   IF you received labwork today, you will receive an invoice from [employer]. Please contact [facility] at [phone] with questions or concerns regarding your invoice.   Our billing staff will not be able to assist you with questions regarding bills from these companies.  You will be contacted with the lab results as soon as they are available. The fastest way to get your results is to activate your My Chart account. Instructions are located on the last page of this paperwork. If you have not heard from [place] regarding the results in 2 weeks, please contact this office.     We recommend that you schedule a mammogram for breast cancer screening. Typically, you do not need a referral to do this. Please contact a local imaging center to schedule your mammogram.  [facility] - [phone]  *ask for the Radiology Department [facility] ([place]) - [phone] or [phone]  [facility] - [phone] [place] [phone] [facility] - [phone]  *ask for the [place] - [phone]  *ask for the Radiology Department [facility] - [phone]  *ask for the [place] - [phone]

## 2016-05-08 ENCOUNTER — Ambulatory Visit (INDEPENDENT_AMBULATORY_CARE_PROVIDER_SITE_OTHER): Payer: Medicare Other | Admitting: Family Medicine

## 2016-05-08 VITALS — BP 122/72 | HR 89 | Temp 98.4°F | Resp 17 | Ht <= 58 in | Wt 150.0 lb

## 2016-05-08 DIAGNOSIS — R748 Abnormal levels of other serum enzymes: Secondary | ICD-10-CM

## 2016-05-08 DIAGNOSIS — E871 Hypo-osmolality and hyponatremia: Secondary | ICD-10-CM | POA: Diagnosis not present

## 2016-05-08 DIAGNOSIS — N200 Calculus of kidney: Secondary | ICD-10-CM | POA: Diagnosis not present

## 2016-05-08 DIAGNOSIS — E875 Hyperkalemia: Secondary | ICD-10-CM | POA: Diagnosis not present

## 2016-05-08 DIAGNOSIS — R739 Hyperglycemia, unspecified: Secondary | ICD-10-CM | POA: Diagnosis not present

## 2016-05-08 DIAGNOSIS — R7989 Other specified abnormal findings of blood chemistry: Secondary | ICD-10-CM

## 2016-05-08 LAB — GLUCOSE, POCT (MANUAL RESULT ENTRY): POC Glucose: 261 mg/dl — AB (ref 70–99)

## 2016-05-08 NOTE — Patient Instructions (Addendum)
Increase lantus to 12 units per day. Then starting in 2 days, can increase by 2 units every 2-3 days until blood sugars remain below 200. Then remain at that dose each day.  No change in Januvia at this time, but due to that medication  running out on Sunday, this can be discusssed on Friday to see if we need to continue that medication or look into other option. I anticipate restarting metformin once kidney function improves, which should happen after your kidney stone treatment.  Follow-up with Dr. Everlene Farrier or Dr. Tamala Julian on Friday morning (call for appointment after 4 PM on Thursday).  They will likely repeat your blood tests to look at your sodium, potassium, and blood sugar at that time.  Return to the clinic or go to the nearest emergency room if any of your symptoms worsen or new symptoms occur.  Diabetes Mellitus and Food It is important for you to manage your blood sugar (glucose) level. Your blood glucose level can be greatly affected by what you eat. Eating healthier foods in the appropriate amounts throughout the day at about the same time each day will help you control your blood glucose level. It can also help slow or prevent worsening of your diabetes mellitus. Healthy eating may even help you improve the level of your blood pressure and reach or maintain a healthy weight.  General recommendations for healthful eating and cooking habits include:  Eating meals and snacks regularly. Avoid going long periods of time without eating to lose weight.  Eating a diet that consists mainly of plant-based foods, such as fruits, vegetables, nuts, legumes, and whole grains.  Using low-heat cooking methods, such as baking, instead of high-heat cooking methods, such as deep frying. Work with your dietitian to make sure you understand how to use the Nutrition Facts information on food labels. HOW CAN FOOD AFFECT ME? Carbohydrates Carbohydrates affect your blood glucose level more than any other type of  food. Your dietitian will help you determine how many carbohydrates to eat at each meal and teach you how to count carbohydrates. Counting carbohydrates is important to keep your blood glucose at a healthy level, especially if you are using insulin or taking certain medicines for diabetes mellitus. Alcohol Alcohol can cause sudden decreases in blood glucose (hypoglycemia), especially if you use insulin or take certain medicines for diabetes mellitus. Hypoglycemia can be a life-threatening condition. Symptoms of hypoglycemia (sleepiness, dizziness, and disorientation) are similar to symptoms of having too much alcohol.  If your health care provider has given you approval to drink alcohol, do so in moderation and use the following guidelines:  Women should not have more than one drink per day, and men should not have more than two drinks per day. One drink is equal to:  12 oz of beer.  5 oz of wine.  1 oz of hard liquor.  Do not drink on an empty stomach.  Keep yourself hydrated. Have water, diet soda, or unsweetened iced tea.  Regular soda, juice, and other mixers might contain a lot of carbohydrates and should be counted. WHAT FOODS ARE NOT RECOMMENDED? As you make food choices, it is important to remember that all foods are not the same. Some foods have fewer nutrients per serving than other foods, even though they might have the same number of calories or carbohydrates. It is difficult to get your body what it needs when you eat foods with fewer nutrients. Examples of foods that you should avoid that are high  in calories and carbohydrates but low in nutrients include:  Trans fats (most processed foods list trans fats on the Nutrition Facts label).  Regular soda.  Juice.  Candy.  Sweets, such as cake, pie, doughnuts, and cookies.  Fried foods. WHAT FOODS CAN I EAT? Eat nutrient-rich foods, which will nourish your body and keep you healthy. The food you should eat also will depend  on several factors, including:  The calories you need.  The medicines you take.  Your weight.  Your blood glucose level.  Your blood pressure level.  Your cholesterol level. You should eat a variety of foods, including:  Protein.  Lean cuts of meat.  Proteins low in saturated fats, such as fish, egg whites, and beans. Avoid processed meats.  Fruits and vegetables.  Fruits and vegetables that may help control blood glucose levels, such as apples, mangoes, and yams.  Dairy products.  Choose fat-free or low-fat dairy products, such as milk, yogurt, and cheese.  Grains, bread, pasta, and rice.  Choose whole grain products, such as multigrain bread, whole oats, and brown rice. These foods may help control blood pressure.  Fats.  Foods containing healthful fats, such as nuts, avocado, olive oil, canola oil, and fish. DOES EVERYONE WITH DIABETES MELLITUS HAVE THE SAME MEAL PLAN? Because every person with diabetes mellitus is different, there is not one meal plan that works for everyone. It is very important that you meet with a dietitian who will help you create a meal plan that is just right for you.   This information is not intended to replace advice given to you by your health care provider. Make sure you discuss any questions you have with your health care provider.   Document Released: 06/14/2005 Document Revised: 10/08/2014 Document Reviewed: 08/14/2013 Elsevier Interactive Patient Education 2016 [employer].   IF you received an x-ray today, you will receive an invoice from [facility] Radiology. Please contact [facility] Radiology at [phone] with questions or concerns regarding your invoice.   IF you received labwork today, you will receive an invoice from [employer]. Please contact [facility] at [phone] with questions or concerns regarding your invoice.   Our billing staff will not be able to assist you with questions regarding  bills from these companies.  You will be contacted with the lab results as soon as they are available. The fastest way to get your results is to activate your My Chart account. Instructions are located on the last page of this paperwork. If you have not heard from [place] regarding the results in 2 weeks, please contact this office.    We recommend that you schedule a mammogram for breast cancer screening. Typically, you do not need a referral to do this. Please contact a local imaging center to schedule your mammogram.  [facility] - [phone]  *ask for the Radiology Department [facility] ([place]) - [phone] or [phone]  [facility] - [phone] [place] [phone] [facility] - [phone]  *ask for the [facility] - [phone]  *ask for the Radiology Department [facility] - [phone]  *ask for the [place] - [phone]

## 2016-05-08 NOTE — Progress Notes (Signed)
By signing my name below, I, Natalie [name], attest that this documentation has been prepared under the direction and in the presence of Natalie Ray, MD.  Electronically Signed: Verlee [name], Medical Scribe. 05/08/16. 3:45 PM.  Subjective:    Patient ID: Natalie [name], female    DOB: Oct 06, 1950, 66 y.o.   MRN: [id number]  HPI Chief Complaint  Patient presents with   Follow-up    diabetes     HPI Comments: Natalie [name] is a 65 y.o. female who presents to the [facility] for hyperglycemia follow-up from yesterday. See last visit. We recommended starting Lantus 10 units QD, her blood sugar was 430 on send out labs yesterday. Pt has surgery in 6 days, 8/14.  Pt took Lantus 10 units in the afternoon after her office visit, and her blood sugar reading before bed was 228. Pt's morning blood sugar reading was 219, and an hour after eating a peach for lunch around 2:30 pm today, her blood sugar reading was 328. Pt states her DM was never this high before in the past. Pt denies experiencing abdominal pain, CP, palpitation, leg swelling, diarrhea, nausea, vomiting, and visual disturbance.  Elevated creatinine, hx of AKI, and nephrolithiasis. This was improved from 1.6, 10 days ago.  Lab Results  Component Value Date   CREATININE 1.54 (H) 05/07/2016   Sodium and potassium likely due to elevated blood sugar. 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  ([place]) 12/28/2013   BMI 33.0-33.9,adult 03/30/2013   Recurrent UTI 09/17/2012   DM (diabetes mellitus) ([place]) 03/19/2012   HTN (hypertension) 03/19/2012   Hyperlipemia 03/19/2012   Hypothyroidism 03/19/2012   Seizure disorder ([place]) 03/19/2012   Insomnia 03/19/2012   Migraine 03/19/2012   Past Medical History:  Diagnosis Date   Diabetes mellitus without complication ([place])    Hyperlipidemia    Hypertension    Past Surgical History:  Procedure Laterality Date   CESAREAN SECTION     CHOLECYSTECTOMY     Allergies  Allergen Reactions   Compazine     Bad headache, vomiting    Cymbalta [Duloxetine Hcl]     unknown   Escitalopram Oxalate Other (See Comments)    Can't sleep   Methadone Hives   Nitrofuran Derivatives     unknown   Prior to Admission medications   Medication Sig Start Date End Date Taking? Authorizing Provider  amLODipine (NORVASC) 5 MG tablet Take 1 tablet (5 mg total) by mouth daily. 04/22/16   Sedalia Muta, FNP  atorvastatin (LIPITOR) 20 MG tablet Take 1 tablet (20 mg total) by mouth daily. 01/04/16   Leandrew Koyanagi, MD  butorphanol (STADOL) 10 MG/ML nasal spray Place 1 spray into the nose every 4 (four) hours as needed for migraine. May refill weekly. To follow last prescription of 02/29/16. Awaiting transfer to [facility] Management program at [facility]. 04/02/16   Leandrew Koyanagi, MD  clobetasol cream (TEMOVATE) 0.05 %  Apply topically 2 (two) times daily. 01/21/16   Leandrew Koyanagi, MD  ESTRACE VAGINAL 0.1 MG/GM vaginal cream insert 1/4 gram vaginally two times a week 12/03/15   Historical Provider, MD  insulin glargine (LANTUS) 100 UNIT/ML injection Inject 0.1 mLs (10 Units total) into the skin daily. 04/28/16   Wendie Agreste, MD  levothyroxine (SYNTHROID, LEVOTHROID) 75 MCG tablet Take 1 tablet (75 mcg total) by mouth daily. 01/04/16   Leandrew Koyanagi, MD  ondansetron (ZOFRAN-ODT) 8 MG disintegrating tablet DISSOLVE 1 TABLET ON OR UNDER  THE TONGUE EVERY 8 HOURS AS NEEDED FOR NAUSEA 12/29/15   Leandrew Koyanagi, MD  PHENobarbital (LUMINAL) 97.2 MG tablet TAKE TWO TABLETS BY MOUTH ONCE DAILY ON  MON,  WED,  AND  FRI  AND  1  TABLET  DAILY  ON  OTHER  DAYS 01/04/16   Leandrew Koyanagi, MD  sitaGLIPtin (JANUVIA) 50 MG tablet Take 1 tablet (50 mg total) by mouth daily. 05/04/16   Shawnee Knapp, MD  terconazole (TERAZOL 3) 0.8 % vaginal cream Place 1 applicator vaginally at bedtime.  12/05/15   Historical Provider, MD  zolpidem (AMBIEN) 10 MG tablet take 1 tablet by mouth at bedtime for sleep if needed 04/23/16   Ezekiel Slocumb, PA-C   Social History   Social History   Marital status: Married    Spouse name: N/A   Number of children: N/A   Years of education: N/A   Occupational History   Not on file.   Social History Main Topics   Smoking status: Never Smoker   Smokeless tobacco: Never Used   Alcohol use No   Drug use: No   Sexual activity: Not on file   Other Topics Concern   Not on file   Social History Narrative   No narrative on file   Depression screen [facility] 2/9 05/08/2016 05/07/2016 04/27/2016 04/20/2016 04/02/2016  Decreased Interest 0 0 0 0 0  Down, Depressed, Hopeless 0 0 0 0 0  PHQ - 2 Score 0 0 0 0 0   Review of Systems  Eyes: Negative for visual disturbance.  Cardiovascular: Negative for chest pain, palpitations and leg swelling.  Gastrointestinal: Negative for abdominal pain, diarrhea, nausea and vomiting.   Objective:  Physical Exam  Constitutional: She appears well-developed and well-nourished. No distress.  HENT:  Head: Normocephalic and atraumatic.  Eyes: Conjunctivae are normal.  Neck: Neck supple.  Cardiovascular: Normal rate and regular rhythm.  Exam reveals friction rub. Exam reveals no gallop.   No murmur heard. Pulmonary/Chest: Effort normal and breath sounds normal. No respiratory distress. She has no wheezes. She has no rales.  Musculoskeletal: She exhibits edema (lower extremity).    Neurological: She is alert.  Skin: Skin is warm and dry.  Psychiatric: She has a normal mood and affect. Her behavior is normal.  Nursing note and vitals reviewed.  BP 122/72 (BP Location: Right Arm, Patient Position: Sitting, Cuff Size: Normal)    Pulse 89    Temp 98.4 F (36.9 C) (Oral)    Resp 17    Ht 4\' 8"  (1.422 m)    Wt 150 lb (68 kg)    SpO2 98%    BMI 33.63 kg/m    Results for orders placed or performed in visit on 05/08/16  POCT glucose (manual entry)  Result Value Ref Range   POC Glucose 261 (A) 70 - 99 mg/dl   [4:30 PM] Pt's blood sugar meter read 231.  Assessment &  Plan:  Valrie Coville is a 65 y.o. female Hyperglycemia - Plan: POCT glucose (manual entry)  -Uncontrolled diabetes in setting of recent infection, and off metformin due to acute kidney injury and elevated creatinine from previous infection. Concurrent nephrolithiasis, nephrostomy tube in place, with plan for nephrolith removal on Monday August 14.  -See previous notes. When metformin discontinued, Januvia was started, but persistent elevated readings. Started on Lantus, but first dose taken was on day prior to this visit. Has had some improvement in readings, but suspect will need higher doses.  -Increase Lantus to 12 units daily for the next 2 days, then every 2-3 days can increase dose by 2 units until readings are below 200. Follow-up with Dr. Everlene Farrier or Dr. Tamala Julian in 3 days to reassess as upcoming surgery on Monday.  Elevated serum creatinine  -Stable/slightly improved at last check. Consider repeat BMP this Friday on recheck. Maintain hydration, avoid NSAIDs.    Hyponatremia Hyperkalemia  -Both likely due to degree of hyperglycemia. Can be repeated on upcoming visit.   Nephrolithiasis  -As above, planning for surgery this upcoming Monday. Has nephrostomy tube in place.  No orders of the defined types were placed in this encounter.  Patient Instructions   Increase lantus to 12 units per day. Then starting  in 2 days, can increase by 2 units every 2-3 days until blood sugars remain below 200. Then remain at that dose each day.  No change in Januvia at this time, but due to that medication  running out on Sunday, this can be discusssed on Friday to see if we need to continue that medication or look into other option. I anticipate restarting metformin once kidney function improves, which should happen after your kidney stone treatment.  Follow-up with Dr. Everlene Farrier or Dr. Tamala Julian on Friday morning (call for appointment after 4 PM on Thursday).  They will likely repeat your blood tests to look at your sodium, potassium, and blood sugar at that time.  Return to the clinic or go to the nearest emergency room if any of your symptoms worsen or new symptoms occur.  Diabetes Mellitus and Food It is important for you to manage your blood sugar (glucose) level. Your blood glucose level can be greatly affected by what you eat. Eating healthier foods in the appropriate amounts throughout the day at about the same time each day will help you control your blood glucose level. It can also help slow or prevent worsening of your diabetes mellitus. Healthy eating may even help you improve the level of your blood pressure and reach or maintain a healthy weight.  General recommendations for healthful eating and cooking habits include:  Eating meals and snacks regularly. Avoid going long periods of time without eating to lose weight.  Eating a diet that consists mainly of plant-based foods, such as fruits, vegetables, nuts, legumes, and whole grains.  Using low-heat cooking methods, such as baking, instead of high-heat cooking methods, such as deep frying. Work with your dietitian to make sure you understand how to use the Nutrition Facts information on food labels. HOW CAN FOOD AFFECT ME? Carbohydrates Carbohydrates affect your blood glucose level more than any other type of food. Your dietitian will help you determine how many  carbohydrates to eat at each meal and teach you how to count carbohydrates. Counting carbohydrates is important to keep your blood glucose at a healthy level, especially if you are using insulin or taking certain medicines for diabetes mellitus. Alcohol Alcohol can cause sudden  decreases in blood glucose (hypoglycemia), especially if you use insulin or take certain medicines for diabetes mellitus. Hypoglycemia can be a life-threatening condition. Symptoms of hypoglycemia (sleepiness, dizziness, and disorientation) are similar to symptoms of having too much alcohol.  If your health care provider has given you approval to drink alcohol, do so in moderation and use the following guidelines:  Women should not have more than one drink per day, and men should not have more than two drinks per day. One drink is equal to:  12 oz of beer.  5 oz of wine.  1 oz of hard liquor.  Do not drink on an empty stomach.  Keep yourself hydrated. Have water, diet soda, or unsweetened iced tea.  Regular soda, juice, and other mixers might contain a lot of carbohydrates and should be counted. WHAT FOODS ARE NOT RECOMMENDED? As you make food choices, it is important to remember that all foods are not the same. Some foods have fewer nutrients per serving than other foods, even though they might have the same number of calories or carbohydrates. It is difficult to get your body what it needs when you eat foods with fewer nutrients. Examples of foods that you should avoid that are high in calories and carbohydrates but low in nutrients include:  Trans fats (most processed foods list trans fats on the Nutrition Facts label).  Regular soda.  Juice.  Candy.  Sweets, such as cake, pie, doughnuts, and cookies.  Fried foods. WHAT FOODS CAN I EAT? Eat nutrient-rich foods, which will nourish your body and keep you healthy. The food you should eat also will depend on several factors, including:  The calories you  need.  The medicines you take.  Your weight.  Your blood glucose level.  Your blood pressure level.  Your cholesterol level. You should eat a variety of foods, including:  Protein.  Lean cuts of meat.  Proteins low in saturated fats, such as fish, egg whites, and beans. Avoid processed meats.  Fruits and vegetables.  Fruits and vegetables that may help control blood glucose levels, such as apples, mangoes, and yams.  Dairy products.  Choose fat-free or low-fat dairy products, such as milk, yogurt, and cheese.  Grains, bread, pasta, and rice.  Choose whole grain products, such as multigrain bread, whole oats, and brown rice. These foods may help control blood pressure.  Fats.  Foods containing healthful fats, such as nuts, avocado, olive oil, canola oil, and fish. DOES EVERYONE WITH DIABETES MELLITUS HAVE THE SAME MEAL PLAN? Because every person with diabetes mellitus is different, there is not one meal plan that works for everyone. It is very important that you meet with a dietitian who will help you create a meal plan that is just right for you.   This information is not intended to replace advice given to you by your health care provider. Make sure you discuss any questions you have with your health care provider.   Document Released: 06/14/2005 Document Revised: 10/08/2014 Document Reviewed: 08/14/2013 Elsevier Interactive Patient Education 2016 [employer].   IF you received an x-[name] today, you will receive an invoice from [facility]. Please contact [facility]iology at [phone] with questions or concerns regarding your invoice.   IF you received labwork today, you will receive an invoice from [employer]. Please contact [facility] at [phone] with questions or concerns regarding your invoice.   Our billing staff will not be able to assist you with questions regarding bills from these  companies.  You will be contacted  with the lab results as soon as they are available. The fastest way to get your results is to activate your My Chart account. Instructions are located on the last page of this paperwork. If you have not heard from [place] regarding the results in 2 weeks, please contact this office.    We recommend that you schedule a mammogram for breast cancer screening. Typically, you do not need a referral to do this. Please contact a local imaging center to schedule your mammogram.  [facility] - [phone]  *ask for the Radiology [place] ([place]) - [phone] or [phone]  [facility] - [phone] [place] [phone] [facility] - [phone]  *ask for the [facility] - [phone]  *ask for the Radiology Department [facility] - [phone]  *ask for the [place] - [phone]     I personally performed the services described in this documentation, which was scribed in my presence. The recorded information has been reviewed and considered, and addended by me as needed.   Signed,   Natalie Ray, MD [facility].  05/09/16 4:37 PM

## 2016-05-09 NOTE — Progress Notes (Signed)
HgbA1c 05/10/16

## 2016-05-09 NOTE — Patient Instructions (Signed)
Natalie [name]  05/09/2016   Your procedure is scheduled on: 05/14/16  Report to [facility] Main  Entrance take [place]  elevators to 3rd floor to  [place] at [place] AM.  Call this number if you have problems the morning of surgery [phone]   Remember: ONLY 1 PERSON MAY GO WITH YOU TO SHORT STAY TO GET  READY MORNING OF [place].  Do not eat food or drink liquids :After Midnight.   TAKE OPNE HALF OF INSULIN DOSAGE AT BEDTIME, HAVE SNACK BEFORE MIDNIGHT  Take these medicines the morning of surgery with A SIP OF WATER: Amlodipine, Levothyroxine, Phenobarbital, May use Stadol nasal spray, may take Zofran if needed DO NOT TAKE ANY DIABETIC MEDICATIONS DAY OF YOUR SURGERY                               You may not have any metal on your body including hair pins and              piercings  Do not wear jewelry, make-up, lotions, powders or perfumes, deodorant             Do not wear nail polish.  Do not shave  48 hours prior to surgery.              Men may shave face and neck.   Do not bring valuables to the hospital. [place].  Contacts, dentures or bridgework may not be worn into surgery.  Leave suitcase in the car. After surgery it may be brought to your room.           [place] - Preparing for Surgery Before surgery, you can play an important role.  Because skin is not sterile, your skin needs to be as free of germs as possible.  You can reduce the number of germs on your skin by washing with CHG (chlorahexidine gluconate) soap before surgery.  CHG is an antiseptic cleaner which kills germs and bonds with the skin to continue killing germs even after washing. Please DO NOT use if you have an allergy to CHG or antibacterial soaps.  If your skin becomes reddened/irritated stop using the CHG and inform your nurse when you arrive at Short Stay. Do not shave (including legs and underarms) for at least 48 hours  prior to the first CHG shower.  You may shave your face/neck. Please follow these instructions carefully:  1.  Shower with CHG Soap the night before surgery and the  morning of Surgery.  2.  If you choose to wash your hair, wash your hair first as usual with your  normal  shampoo.  3.  After you shampoo, rinse your hair and body thoroughly to remove the  shampoo.                           4.  Use CHG as you would any other liquid soap.  You can apply chg directly  to the skin and wash                       Gently with a scrungie or clean washcloth.  5.  Apply the CHG  Soap to your body ONLY FROM THE NECK DOWN.   Do not use on face/ open                           Wound or open sores. Avoid contact with eyes, ears mouth and genitals (private parts).                       Wash face,  Genitals (private parts) with your normal soap.             6.  Wash thoroughly, paying special attention to the area where your surgery  will be performed.  7.  Thoroughly rinse your body with warm water from the neck down.  8.  DO NOT shower/wash with your normal soap after using and rinsing off  the CHG Soap.                9.  Pat yourself dry with a clean towel.            10.  Wear clean pajamas.            11.  Place clean sheets on your bed the night of your first shower and do not  sleep with pets. Day of Surgery : Do not apply any lotions/deodorants the morning of surgery.  Please wear clean clothes to the hospital/surgery center.  FAILURE TO FOLLOW THESE INSTRUCTIONS MAY RESULT IN THE CANCELLATION OF YOUR SURGERY PATIENT SIGNATURE_________________________________  NURSE SIGNATURE__________________________________  ________________________________________________________________________

## 2016-05-10 ENCOUNTER — Encounter (HOSPITAL_COMMUNITY): Payer: Self-pay

## 2016-05-10 ENCOUNTER — Encounter (HOSPITAL_COMMUNITY)
Admission: RE | Admit: 2016-05-10 | Discharge: 2016-05-10 | Disposition: A | Discharge status: Home / Self Care | Payer: Medicare Other | Source: Ambulatory Visit | Attending: Urology | Admitting: Urology

## 2016-05-10 DIAGNOSIS — E119 Type 2 diabetes mellitus without complications: Secondary | ICD-10-CM | POA: Insufficient documentation

## 2016-05-10 DIAGNOSIS — Z01810 Encounter for preprocedural cardiovascular examination: Secondary | ICD-10-CM | POA: Insufficient documentation

## 2016-05-10 DIAGNOSIS — Z01812 Encounter for preprocedural laboratory examination: Secondary | ICD-10-CM | POA: Insufficient documentation

## 2016-05-10 LAB — BASIC METABOLIC PANEL
ANION GAP: 8 (ref 5–15)
BUN: 25 mg/dL — ABNORMAL HIGH (ref 6–20)
CHLORIDE: 104 mmol/L (ref 101–111)
CO2: 23 mmol/L (ref 22–32)
Calcium: 9.8 mg/dL (ref 8.9–10.3)
Creatinine, Ser: 1.73 mg/dL — ABNORMAL HIGH (ref 0.44–1.00)
GFR calc non Af Amer: 29 mL/min — ABNORMAL LOW (ref >60)
GFR, EST AFRICAN AMERICAN: 34 mL/min — AB (ref >60)
Glucose, Bld: 167 mg/dL — ABNORMAL HIGH (ref 65–99)
Potassium: 4.3 mmol/L (ref 3.5–5.1)
Sodium: 135 mmol/L (ref 135–145)

## 2016-05-10 LAB — CBC
HCT: 32.4 % — ABNORMAL LOW (ref 36.0–46.0)
HEMOGLOBIN: 9.9 g/dL — AB (ref 12.0–15.0)
MCH: 26.4 pg (ref 26.0–34.0)
MCHC: 30.6 g/dL (ref 30.0–36.0)
MCV: 86.4 fL (ref 78.0–100.0)
Platelets: 330 10*3/uL (ref 150–400)
RBC: 3.75 MIL/uL — AB (ref 3.87–5.11)
RDW: 15.3 % (ref 11.5–15.5)
WBC: 5.8 10*3/uL (ref 4.0–10.5)

## 2016-05-10 LAB — ABO/RH: ABO/RH(D): A POS

## 2016-05-10 NOTE — Progress Notes (Signed)
Right perc tube in place

## 2016-05-11 ENCOUNTER — Ambulatory Visit (INDEPENDENT_AMBULATORY_CARE_PROVIDER_SITE_OTHER): Payer: Medicare Other | Admitting: Family Medicine

## 2016-05-11 VITALS — BP 112/72 | HR 80 | Temp 98.0°F | Resp 18 | Ht <= 58 in | Wt 149.2 lb

## 2016-05-11 DIAGNOSIS — N200 Calculus of kidney: Secondary | ICD-10-CM

## 2016-05-11 DIAGNOSIS — N179 Acute kidney failure, unspecified: Secondary | ICD-10-CM | POA: Diagnosis not present

## 2016-05-11 DIAGNOSIS — E1129 Type 2 diabetes mellitus with other diabetic kidney complication: Secondary | ICD-10-CM

## 2016-05-11 DIAGNOSIS — N289 Disorder of kidney and ureter, unspecified: Secondary | ICD-10-CM

## 2016-05-11 LAB — COMPREHENSIVE METABOLIC PANEL
ALBUMIN: 4 g/dL (ref 3.6–5.1)
ALT: 17 U/L (ref 6–29)
AST: 21 U/L (ref 10–35)
Alkaline Phosphatase: 90 U/L (ref 33–130)
BUN: 27 mg/dL — ABNORMAL HIGH (ref 7–25)
CHLORIDE: 104 mmol/L (ref 98–110)
CO2: 21 mmol/L (ref 20–31)
CREATININE: 2.23 mg/dL — AB (ref 0.50–0.99)
Calcium: 10.2 mg/dL (ref 8.6–10.4)
Glucose, Bld: 142 mg/dL — ABNORMAL HIGH (ref 65–99)
POTASSIUM: 5.3 mmol/L (ref 3.5–5.3)
SODIUM: 136 mmol/L (ref 135–146)
Total Bilirubin: 0.2 mg/dL (ref 0.2–1.2)
Total Protein: 7.1 g/dL (ref 6.1–8.1)

## 2016-05-11 LAB — GLUCOSE, POCT (MANUAL RESULT ENTRY): POC Glucose: 153 mg/dl — AB (ref 70–99)

## 2016-05-11 NOTE — Patient Instructions (Addendum)
1. Increase Lantus to 14 units daily until surgery. 2.  Take Lantus 7 units on Monday morning.   3. Continue to check sugars three times daily.     IF you received an x-ray today, you will receive an invoice from [facility] Radiology. Please contact [facility] Radiology at [phone] with questions or concerns regarding your invoice.   IF you received labwork today, you will receive an invoice from [employer]. Please contact [facility] at [phone] with questions or concerns regarding your invoice.   Our billing staff will not be able to assist you with questions regarding bills from these companies.  You will be contacted with the lab results as soon as they are available. The fastest way to get your results is to activate your My Chart account. Instructions are located on the last page of this paperwork. If you have not heard from [place] regarding the results in 2 weeks, please contact this office.

## 2016-05-11 NOTE — Progress Notes (Signed)
Patient ID: Natalie [name], female   DOB: 1950-10-16, 65 y.o.   MRN: [id number]   Subjective:  By signing my name below, I, Moises Blood, attest that this documentation has been prepared under the direction and in the presence of Reginia Forts, MD. Electronically Signed: Moises Blood, [place]. 05/11/2016 , 4:04 PM .  Patient was seen in Room 11 .   Patient ID: Natalie [name], female    DOB: [birth year], 66 y.o.   MRN: [id number]  05/11/2016  Follow-up (Dr. Carlota Raspberry wanted her to be seen again before her surgery )   HPI Natalie [name] is a 65 y.o. female who presents to [facility] for follow up. She was seen by Dr. Carlota Raspberry on 05/08/16 and she has an upcoming surgery in 3 days to be performed by Dr. Diona Fanti. She is currently off of her metformin due to acute kidney insufficiency. Added [place] and started on lantus, which was increased to 12 units, and increase by 2 units every 2~3 days until blood sugars remaining below 200. Follow up today. Her creatinine was 1.73 which is elevated and sugar was 167, which is much better.   Patient has been checking her sugar 3 times a day: This morning was 153, this afternoon was 139. Yesterday morning was 159 She denies any sugars in the 200s since she was here. She denies any symptomatic lows.   She has fatigue and some back pain. She also notes the rain has been triggering her migraine. She denies nausea or vomiting outside of her migraine. She denies calves swelling.   Review of Systems  Constitutional: Positive for fatigue. Negative for chills, diaphoresis, fever and unexpected weight change.  Eyes: Negative for visual disturbance.  Respiratory: Negative for cough, chest tightness and shortness of breath.   Cardiovascular: Negative for chest pain, palpitations and leg swelling.  Gastrointestinal: Negative for abdominal pain, blood in stool, constipation, diarrhea, nausea and vomiting.  Endocrine: Negative for cold intolerance, heat intolerance, polydipsia, polyphagia  and polyuria.  Genitourinary: Negative for hematuria.  Musculoskeletal: Positive for back pain.  Neurological: Positive for headaches. Negative for dizziness, tremors, seizures, syncope, facial asymmetry, speech difficulty, weakness, light-headedness and numbness.    Past Medical History:  Diagnosis Date  . Chronic kidney disease    kidney stones, right and left  . Diabetes mellitus without complication ([place])   . Headache    migraines  . Hyperlipidemia   . Hypertension   . Seizures ([place])    none x 30 yrs   Past Surgical History:  Procedure Laterality Date  . CESAREAN SECTION    . CHOLECYSTECTOMY    . NEPHROLITHOTOMY Right 05/14/2016   Procedure: NEPHROLITHOTOMY PERCUTANEOUS;  Surgeon: Franchot Gallo, MD;  Location: [facility];  Service: Urology;  Laterality: Right;  . SHOULDER ARTHROSCOPY Bilateral    Allergies  Allergen Reactions  . Compazine     Bad headache, vomiting   . Cymbalta [Duloxetine Hcl]     unknown  . Escitalopram Oxalate Other (See Comments)    Can't sleep  . Methadone Hives  . Nitrofuran Derivatives     unknown    Social History   Social History  . Marital status: Married    Spouse name: N/A  . Number of children: N/A  . Years of education: N/A   Occupational History  . Not on file.   Social History Main Topics  . Smoking status: Never Smoker  . Smokeless tobacco: Never Used  . Alcohol use No  . Drug use: No  . Sexual  activity: Not on file   Other Topics Concern  . Not on file   Social History Narrative  . No narrative on file   Family History  Problem Relation Age of Onset  . Heart disease Father   . Diabetes Father        Objective:    BP 112/72 (BP Location: Right Arm, Patient Position: Sitting, Cuff Size: Normal)   Pulse 80   Temp 98 F (36.7 C) (Oral)   Resp 18   Ht 4\' 8"  (1.422 m)   Wt 149 lb 3.2 oz (67.7 kg)   SpO2 98%   BMI 33.45 kg/m   Physical Exam  Constitutional: She is oriented to person, place, and time.  She appears well-developed and well-nourished. No distress.  HENT:  Head: Normocephalic and atraumatic.  Eyes: Conjunctivae and EOM are normal. Pupils are equal, round, and reactive to light.  Neck: Normal range of motion. Neck supple.  Cardiovascular: Normal rate, regular rhythm and normal heart sounds.  Exam reveals no gallop and no friction rub.   No murmur heard. Pulmonary/Chest: Effort normal and breath sounds normal. No respiratory distress. She has no wheezes. She has no rales.  Musculoskeletal: Normal range of motion. She exhibits no edema.  Neurological: She is alert and oriented to person, place, and time.  Skin: Skin is warm and dry. She is not diaphoretic.  Psychiatric: She has a normal mood and affect. Her behavior is normal.  Nursing note and vitals reviewed.  Results for orders placed or performed in visit on 05/11/16  Comprehensive metabolic panel  Result Value Ref Range   Sodium 136 135 - 146 mmol/L   Potassium 5.3 3.5 - 5.3 mmol/L   Chloride 104 98 - 110 mmol/L   CO2 21 20 - 31 mmol/L   Glucose, Bld 142 (H) 65 - 99 mg/dL   BUN 27 (H) 7 - 25 mg/dL   Creat 2.23 (H) 0.50 - 0.99 mg/dL   Total Bilirubin 0.2 0.2 - 1.2 mg/dL   Alkaline Phosphatase 90 33 - 130 U/L   AST 21 10 - 35 U/L   ALT 17 6 - 29 U/L   Total Protein 7.1 6.1 - 8.1 g/dL   Albumin 4.0 3.6 - 5.1 g/dL   Calcium 10.2 8.6 - 10.4 mg/dL  POCT glucose (manual entry)  Result Value Ref Range   POC Glucose 153 (A) 70 - 99 mg/dl       Assessment & Plan:   1. Type 2 diabetes mellitus with other diabetic kidney complication, without long-term current use of insulin ([place])   2. AKI (acute kidney injury) ([place])   3. Nephrolithiasis   4. Acute renal insufficiency     Orders Placed This Encounter  Procedures  . Comprehensive metabolic panel  . POCT glucose (manual entry)   No orders of the defined types were placed in this encounter.   No Follow-up on file.   I increased her lantus to 14 units. She  will take half that amount (7 units) on Monday before her surgery   I personally performed the services described in this documentation, which was scribed in my presence. The recorded information has been reviewed and considered.  Nickoles Gregori Elayne Guerin, M.D. [facility] [address] [place], [place]  [address] [phone] phone [phone] fax

## 2016-05-14 ENCOUNTER — Ambulatory Visit (HOSPITAL_COMMUNITY): Payer: Medicare Other | Admitting: Anesthesiology

## 2016-05-14 ENCOUNTER — Encounter (HOSPITAL_COMMUNITY)
Admission: RE | Disposition: A | Discharge status: Home / Self Care | Payer: Self-pay | Source: Ambulatory Visit | Attending: Urology

## 2016-05-14 ENCOUNTER — Ambulatory Visit (HOSPITAL_COMMUNITY): Payer: Medicare Other

## 2016-05-14 ENCOUNTER — Observation Stay (HOSPITAL_COMMUNITY)
Admission: RE | Admit: 2016-05-14 | Discharge: 2016-05-15 | Disposition: A | Discharge status: Home / Self Care | Payer: Medicare Other | Source: Ambulatory Visit | Attending: Urology | Admitting: Urology

## 2016-05-14 ENCOUNTER — Encounter (HOSPITAL_COMMUNITY): Payer: Self-pay | Admitting: *Deleted

## 2016-05-14 DIAGNOSIS — N201 Calculus of ureter: Secondary | ICD-10-CM | POA: Diagnosis not present

## 2016-05-14 DIAGNOSIS — Z79890 Hormone replacement therapy (postmenopausal): Secondary | ICD-10-CM | POA: Diagnosis not present

## 2016-05-14 DIAGNOSIS — E785 Hyperlipidemia, unspecified: Secondary | ICD-10-CM | POA: Insufficient documentation

## 2016-05-14 DIAGNOSIS — I10 Essential (primary) hypertension: Secondary | ICD-10-CM | POA: Diagnosis not present

## 2016-05-14 DIAGNOSIS — N200 Calculus of kidney: Principal | ICD-10-CM | POA: Insufficient documentation

## 2016-05-14 DIAGNOSIS — Z794 Long term (current) use of insulin: Secondary | ICD-10-CM | POA: Insufficient documentation

## 2016-05-14 DIAGNOSIS — Z79899 Other long term (current) drug therapy: Secondary | ICD-10-CM | POA: Diagnosis not present

## 2016-05-14 DIAGNOSIS — E119 Type 2 diabetes mellitus without complications: Secondary | ICD-10-CM | POA: Diagnosis not present

## 2016-05-14 HISTORY — PX: NEPHROLITHOTOMY: SHX5134

## 2016-05-14 LAB — GLUCOSE, CAPILLARY
GLUCOSE-CAPILLARY: 171 mg/dL — AB (ref 65–99)
GLUCOSE-CAPILLARY: 148 mg/dL — AB (ref 65–99)
GLUCOSE-CAPILLARY: 204 mg/dL — AB (ref 65–99)
Glucose-Capillary: 195 mg/dL — ABNORMAL HIGH (ref 65–99)
Glucose-Capillary: 169 mg/dL — ABNORMAL HIGH (ref 65–99)

## 2016-05-14 LAB — TYPE AND SCREEN
ABO/RH(D): A POS
Antibody Screen: NEGATIVE

## 2016-05-14 SURGERY — NEPHROLITHOTOMY PERCUTANEOUS
Anesthesia: General | Laterality: Right

## 2016-05-14 MED ORDER — CEFAZOLIN SODIUM-DEXTROSE 2-4 GM/100ML-% IV SOLN
2.0000 g | INTRAVENOUS | Status: AC
  Administered 2016-05-14: 2 g via INTRAVENOUS

## 2016-05-14 MED ORDER — METOCLOPRAMIDE HCL 5 MG/ML IJ SOLN
INTRAMUSCULAR | Status: AC
  Filled 2016-05-14: qty 2

## 2016-05-14 MED ORDER — PROPOFOL 10 MG/ML IV BOLUS
INTRAVENOUS | Status: DC | PRN
  Administered 2016-05-14: 120 mg via INTRAVENOUS

## 2016-05-14 MED ORDER — OXYCODONE HCL 5 MG PO TABS
5.0000 mg | ORAL_TABLET | ORAL | Status: DC | PRN
  Administered 2016-05-14 (×3): 5 mg via ORAL
  Filled 2016-05-14 (×3): qty 1

## 2016-05-14 MED ORDER — ONDANSETRON HCL 4 MG/2ML IJ SOLN
INTRAMUSCULAR | Status: AC
  Filled 2016-05-14: qty 2

## 2016-05-14 MED ORDER — SENNA 8.6 MG PO TABS
1.0000 | ORAL_TABLET | Freq: Two times a day (BID) | ORAL | Status: DC
  Administered 2016-05-14 (×2): 8.6 mg via ORAL
  Filled 2016-05-14 (×2): qty 1

## 2016-05-14 MED ORDER — SODIUM CHLORIDE 0.45 % IV SOLN
INTRAVENOUS | Status: DC
  Administered 2016-05-14 – 2016-05-15 (×2): via INTRAVENOUS

## 2016-05-14 MED ORDER — METOCLOPRAMIDE HCL 5 MG/ML IJ SOLN
INTRAMUSCULAR | Status: DC | PRN
  Administered 2016-05-14: 10 mg via INTRAVENOUS

## 2016-05-14 MED ORDER — ZOLPIDEM TARTRATE 5 MG PO TABS: 5.0000 mg | ORAL_TABLET | Freq: Every evening | ORAL | Status: DC | PRN

## 2016-05-14 MED ORDER — SULFAMETHOXAZOLE-TRIMETHOPRIM 400-80 MG PO TABS
1.0000 | ORAL_TABLET | Freq: Two times a day (BID) | ORAL | Status: DC
  Administered 2016-05-14: 1 via ORAL
  Filled 2016-05-14 (×2): qty 1

## 2016-05-14 MED ORDER — ONDANSETRON HCL 4 MG/2ML IJ SOLN: 4.0000 mg | INTRAMUSCULAR | Status: DC | PRN

## 2016-05-14 MED ORDER — HYDROMORPHONE HCL 1 MG/ML IJ SOLN
0.5000 mg | INTRAMUSCULAR | Status: DC | PRN
  Administered 2016-05-14 – 2016-05-15 (×5): 1 mg via INTRAVENOUS
  Filled 2016-05-14 (×5): qty 1

## 2016-05-14 MED ORDER — LACTATED RINGERS IV SOLN
INTRAVENOUS | Status: DC | PRN
  Administered 2016-05-14 (×2): via INTRAVENOUS

## 2016-05-14 MED ORDER — ROCURONIUM BROMIDE 100 MG/10ML IV SOLN
INTRAVENOUS | Status: AC
  Filled 2016-05-14: qty 1

## 2016-05-14 MED ORDER — PHENOBARBITAL 97.2 MG PO TABS: 97.2000 mg | ORAL_TABLET | Freq: Every day | ORAL | Status: DC

## 2016-05-14 MED ORDER — ONDANSETRON HCL 4 MG/2ML IJ SOLN: 4.0000 mg | Freq: Once | INTRAMUSCULAR | Status: DC | PRN

## 2016-05-14 MED ORDER — CEFAZOLIN SODIUM-DEXTROSE 2-4 GM/100ML-% IV SOLN
INTRAVENOUS | Status: AC
  Filled 2016-05-14: qty 100

## 2016-05-14 MED ORDER — LIDOCAINE HCL (CARDIAC) 20 MG/ML IV SOLN
INTRAVENOUS | Status: AC
  Filled 2016-05-14: qty 5

## 2016-05-14 MED ORDER — PHENOBARBITAL 97.2 MG PO TABS: 194.4000 mg | ORAL_TABLET | ORAL | Status: DC

## 2016-05-14 MED ORDER — INSULIN ASPART 100 UNIT/ML ~~LOC~~ SOLN
0.0000 [IU] | Freq: Three times a day (TID) | SUBCUTANEOUS | Status: DC
  Administered 2016-05-14: 2 [IU] via SUBCUTANEOUS

## 2016-05-14 MED ORDER — OXYCODONE HCL 5 MG PO TABS: 5.0000 mg | ORAL_TABLET | Freq: Once | ORAL | Status: DC | PRN

## 2016-05-14 MED ORDER — ATORVASTATIN CALCIUM 20 MG PO TABS: 20.0000 mg | ORAL_TABLET | Freq: Every day | ORAL | Status: DC

## 2016-05-14 MED ORDER — ONDANSETRON HCL 4 MG/2ML IJ SOLN
INTRAMUSCULAR | Status: DC | PRN
  Administered 2016-05-14: 4 mg via INTRAVENOUS

## 2016-05-14 MED ORDER — CLOBETASOL PROPIONATE 0.05 % EX CREA
TOPICAL_CREAM | Freq: Two times a day (BID) | CUTANEOUS | Status: DC
  Filled 2016-05-14: qty 15

## 2016-05-14 MED ORDER — FENTANYL CITRATE (PF) 100 MCG/2ML IJ SOLN
INTRAMUSCULAR | Status: AC
Start: 2016-05-14 — End: 2016-05-14
  Administered 2016-05-14: 25 ug via INTRAVENOUS
  Filled 2016-05-14: qty 2

## 2016-05-14 MED ORDER — ACETAMINOPHEN 325 MG PO TABS: 650.0000 mg | ORAL_TABLET | ORAL | Status: DC | PRN

## 2016-05-14 MED ORDER — LIDOCAINE HCL (CARDIAC) 20 MG/ML IV SOLN
INTRAVENOUS | Status: DC | PRN
  Administered 2016-05-14: 50 mg via INTRAVENOUS

## 2016-05-14 MED ORDER — EPHEDRINE SULFATE 50 MG/ML IJ SOLN
INTRAMUSCULAR | Status: AC
  Filled 2016-05-14: qty 1

## 2016-05-14 MED ORDER — LEVOTHYROXINE SODIUM 75 MCG PO TABS
75.0000 ug | ORAL_TABLET | Freq: Every day | ORAL | Status: DC
Start: 2016-05-15 — End: 2016-05-15
  Administered 2016-05-15: 75 ug via ORAL
  Filled 2016-05-14: qty 1

## 2016-05-14 MED ORDER — SODIUM CHLORIDE 0.9 % IJ SOLN
INTRAMUSCULAR | Status: AC
  Filled 2016-05-14: qty 10

## 2016-05-14 MED ORDER — ROCURONIUM BROMIDE 100 MG/10ML IV SOLN
INTRAVENOUS | Status: DC | PRN
  Administered 2016-05-14: 20 mg via INTRAVENOUS
  Administered 2016-05-14: 10 mg via INTRAVENOUS
  Administered 2016-05-14: 50 mg via INTRAVENOUS
  Administered 2016-05-14: 20 mg via INTRAVENOUS
  Administered 2016-05-14: 10 mg via INTRAVENOUS

## 2016-05-14 MED ORDER — PROPOFOL 10 MG/ML IV BOLUS
INTRAVENOUS | Status: AC
  Filled 2016-05-14: qty 20

## 2016-05-14 MED ORDER — FLUCONAZOLE 100 MG PO TABS
100.0000 mg | ORAL_TABLET | Freq: Every day | ORAL | Status: DC
  Administered 2016-05-14: 100 mg via ORAL
  Filled 2016-05-14: qty 1

## 2016-05-14 MED ORDER — SUGAMMADEX SODIUM 200 MG/2ML IV SOLN
INTRAVENOUS | Status: DC | PRN
  Administered 2016-05-14: 200 mg via INTRAVENOUS

## 2016-05-14 MED ORDER — INSULIN ASPART 100 UNIT/ML ~~LOC~~ SOLN
0.0000 [IU] | Freq: Every day | SUBCUTANEOUS | Status: DC
  Administered 2016-05-14: 2 [IU] via SUBCUTANEOUS

## 2016-05-14 MED ORDER — SODIUM CHLORIDE 0.9 % IV SOLN
INTRAVENOUS | Status: DC | PRN
  Administered 2016-05-14: 14 mL

## 2016-05-14 MED ORDER — SULFAMETHOXAZOLE-TRIMETHOPRIM 800-160 MG PO TABS: 1.0000 | ORAL_TABLET | Freq: Two times a day (BID) | ORAL | Status: DC

## 2016-05-14 MED ORDER — FENTANYL CITRATE (PF) 250 MCG/5ML IJ SOLN
INTRAMUSCULAR | Status: AC
  Filled 2016-05-14: qty 5

## 2016-05-14 MED ORDER — FENTANYL CITRATE (PF) 100 MCG/2ML IJ SOLN
25.0000 ug | INTRAMUSCULAR | Status: DC | PRN
  Administered 2016-05-14: 25 ug via INTRAVENOUS
  Administered 2016-05-14: 50 ug via INTRAVENOUS

## 2016-05-14 MED ORDER — PHENYLEPHRINE 40 MCG/ML (10ML) SYRINGE FOR IV PUSH (FOR BLOOD PRESSURE SUPPORT)
PREFILLED_SYRINGE | INTRAVENOUS | Status: AC
  Filled 2016-05-14: qty 10

## 2016-05-14 MED ORDER — MIDAZOLAM HCL 5 MG/5ML IJ SOLN
INTRAMUSCULAR | Status: DC | PRN
  Administered 2016-05-14: 2 mg via INTRAVENOUS

## 2016-05-14 MED ORDER — SODIUM CHLORIDE 0.9 % IR SOLN
Status: DC | PRN
  Administered 2016-05-14: 24000 mL

## 2016-05-14 MED ORDER — MIDAZOLAM HCL 2 MG/2ML IJ SOLN
INTRAMUSCULAR | Status: AC
  Filled 2016-05-14: qty 2

## 2016-05-14 MED ORDER — PHENYLEPHRINE HCL 10 MG/ML IJ SOLN
INTRAMUSCULAR | Status: DC | PRN
  Administered 2016-05-14: 80 ug via INTRAVENOUS
  Administered 2016-05-14: 40 ug via INTRAVENOUS
  Administered 2016-05-14: 80 ug via INTRAVENOUS
  Administered 2016-05-14: 40 ug via INTRAVENOUS
  Administered 2016-05-14: 80 ug via INTRAVENOUS
  Administered 2016-05-14: 40 ug via INTRAVENOUS
  Administered 2016-05-14: 80 ug via INTRAVENOUS

## 2016-05-14 MED ORDER — PHENOBARBITAL 97.2 MG PO TABS: 97.2000 mg | ORAL_TABLET | ORAL | Status: DC

## 2016-05-14 MED ORDER — AMLODIPINE BESYLATE 5 MG PO TABS
5.0000 mg | ORAL_TABLET | Freq: Every day | ORAL | Status: DC
  Administered 2016-05-14: 5 mg via ORAL
  Filled 2016-05-14: qty 1

## 2016-05-14 MED ORDER — FENTANYL CITRATE (PF) 100 MCG/2ML IJ SOLN
INTRAMUSCULAR | Status: DC | PRN
  Administered 2016-05-14 (×4): 50 ug via INTRAVENOUS

## 2016-05-14 MED ORDER — SUGAMMADEX SODIUM 200 MG/2ML IV SOLN
INTRAVENOUS | Status: AC
  Filled 2016-05-14: qty 2

## 2016-05-14 MED ORDER — OXYCODONE HCL 5 MG/5ML PO SOLN
5.0000 mg | Freq: Once | ORAL | Status: DC | PRN
  Filled 2016-05-14: qty 5

## 2016-05-14 SURGICAL SUPPLY — 48 items
APL ESCP 34 STRL LF DISP (HEMOSTASIS) ×1
APL SKNCLS STERI-STRIP NONHPOA (GAUZE/BANDAGES/DRESSINGS) ×2
APPLICATOR SURGIFLO ENDO (HEMOSTASIS) ×2 IMPLANT
BAG URINE DRAINAGE (UROLOGICAL SUPPLIES) ×3 IMPLANT
BASKET ZERO TIP NITINOL 2.4FR (BASKET) ×2 IMPLANT
BENZOIN TINCTURE PRP APPL 2/3 (GAUZE/BANDAGES/DRESSINGS) ×6 IMPLANT
BLADE SURG 15 STRL LF DISP TIS (BLADE) ×1 IMPLANT
BLADE SURG 15 STRL SS (BLADE) ×3
BSKT STON RTRVL ZERO TP 2.4FR (BASKET) ×1
CARTRIDGE STONEBREAK CO2 KIDNE (ELECTROSURGICAL) ×4 IMPLANT
CATCHER STONE W/TUBE ADAPTER (UROLOGICAL SUPPLIES) ×1 IMPLANT
CATH UROLOGY TORQUE 40 (MISCELLANEOUS) ×2 IMPLANT
CATH X-FORCE N30 NEPHROSTOMY (TUBING) ×3 IMPLANT
COVER SURGICAL LIGHT HANDLE (MISCELLANEOUS) ×3 IMPLANT
DRAPE C-ARM 42X120 X-RAY (DRAPES) ×3 IMPLANT
DRAPE LINGEMAN PERC (DRAPES) ×3 IMPLANT
DRAPE SURG IRRIG POUCH 19X23 (DRAPES) ×3 IMPLANT
DRSG PAD ABDOMINAL 8X10 ST (GAUZE/BANDAGES/DRESSINGS) ×6 IMPLANT
DRSG TEGADERM 4X4.75 (GAUZE/BANDAGES/DRESSINGS) ×2 IMPLANT
DRSG TEGADERM 8X12 (GAUZE/BANDAGES/DRESSINGS) ×4 IMPLANT
FLOSEAL 10ML (HEMOSTASIS) ×2 IMPLANT
GAUZE SPONGE 4X4 12PLY STRL (GAUZE/BANDAGES/DRESSINGS) ×3 IMPLANT
GLOVE BIOGEL M 8.0 STRL (GLOVE) ×3 IMPLANT
GOWN STRL REUS W/TWL XL LVL3 (GOWN DISPOSABLE) ×3 IMPLANT
GUIDEWIRE AMPLAZ .035X145 (WIRE) ×4 IMPLANT
GUIDEWIRE STR DUAL SENSOR (WIRE) ×2 IMPLANT
KIT BASIN OR (CUSTOM PROCEDURE TRAY) ×3 IMPLANT
MANIFOLD NEPTUNE II (INSTRUMENTS) ×3 IMPLANT
NS IRRIG 1000ML POUR BTL (IV SOLUTION) ×3 IMPLANT
PACK BASIC VI WITH GOWN DISP (CUSTOM PROCEDURE TRAY) ×3 IMPLANT
PACK CYSTO (CUSTOM PROCEDURE TRAY) ×3 IMPLANT
PROBE KIDNEY STONEBRKR 2.0X425 (ELECTROSURGICAL) ×3 IMPLANT
PROBE PNEUMATIC 1.0MMX570MM (UROLOGICAL SUPPLIES) ×3 IMPLANT
SCISSORS LAP 5X45 EPIX DISP (ENDOMECHANICALS) ×3 IMPLANT
SET IRRIG Y TYPE TUR BLADDER L (SET/KITS/TRAYS/PACK) ×3 IMPLANT
SHEATH PEELAWAY SET 9 (SHEATH) ×2 IMPLANT
SPONGE LAP 4X18 X RAY DECT (DISPOSABLE) ×3 IMPLANT
STENT URET 6FRX24 CONTOUR (STENTS) ×2 IMPLANT
STONE CATCHER W/TUBE ADAPTER (UROLOGICAL SUPPLIES) ×3 IMPLANT
SUT SILK 2 0 30  PSL (SUTURE) ×2
SUT SILK 2 0 30 PSL (SUTURE) ×1 IMPLANT
SYR 20CC LL (SYRINGE) ×6 IMPLANT
SYRINGE 10CC LL (SYRINGE) ×3 IMPLANT
TRAY FOLEY BAG SILVER LF 14FR (CATHETERS) ×3 IMPLANT
TRAY FOLEY W/METER SILVER 14FR (SET/KITS/TRAYS/PACK) ×3 IMPLANT
TUBING CONNECTING 10 (TUBING) ×6 IMPLANT
TUBING CONNECTING 10' (TUBING) ×3
WATER STERILE IRR 1500ML POUR (IV SOLUTION) ×3 IMPLANT

## 2016-05-14 NOTE — Progress Notes (Signed)
Post-op note  Subjective: The patient is doing well.  No complaints.  Objective: Vital signs in last 24 hours: Temp:  [97.9 F (36.6 C)-98.7 F (37.1 C)] 98 F (36.7 C) (08/14 1327) Pulse Rate:  [72-89] 77 (08/14 1327) Resp:  [10-18] 18 (08/14 1327) BP: (111-143)/(50-64) 127/60 (08/14 1327) SpO2:  [97 %-100 %] 100 % (08/14 1327) Weight:  [67.7 kg (149 lb 4.8 oz)] 67.7 kg (149 lb 4.8 oz) (08/14 0623)  Intake/Output from previous day: No intake/output data recorded. Intake/Output this shift: Total I/O In: 1000 [I.V.:1000] Out: 400 [Urine:350; Blood:50]  Physical Exam:  General: Alert and oriented. Abdomen: Soft, Nondistended. Incisions: Dressing dry  Lab Results: No results for input(s): HGB, HCT in the last 72 hours.  Assessment/Plan: POD#0   1) Continue to monitor   Lillette Boxer. Milfred Krammes, MD   LOS: 0 days   Jorja Loa 05/14/2016, 5:26 PM

## 2016-05-14 NOTE — H&P (Signed)
H&P  Chief Complaint: Kidney stone  History of Present Illness: Natalie [name] is a 65 y.o. year old female who presents for percutaneous management of several right sided kidney stones, the largest measuring >23 mm in size. She has had urgent placement of a right pcn tube upon original presentation.  Past Medical History:  Diagnosis Date  . Diabetes mellitus without complication ([place])   . Headache    migraines  . Hyperlipidemia   . Hypertension   . Seizures ([place])    none x 30 yrs    Past Surgical History:  Procedure Laterality Date  . CESAREAN SECTION    . CHOLECYSTECTOMY    . SHOULDER ARTHROSCOPY Bilateral     Home Medications:  Medications Prior to Admission  Medication Sig Dispense Refill  . amLODipine (NORVASC) 5 MG tablet Take 1 tablet (5 mg total) by mouth daily. 30 tablet 1  . atorvastatin (LIPITOR) 20 MG tablet Take 1 tablet (20 mg total) by mouth daily. 90 tablet 3  . butorphanol (STADOL) 10 MG/ML nasal spray Place 1 spray into the nose every 4 (four) hours as needed for migraine. May refill weekly. To follow last prescription of 02/29/16. Awaiting transfer to Pain Management program at [facility]. 2.5 mL 5  . clobetasol cream (TEMOVATE) 0.05 % Apply topically 2 (two) times daily. 30 g 1  . ESTRACE VAGINAL 0.1 MG/GM vaginal cream insert 1/4 gram vaginally two times a week  0  . insulin glargine (LANTUS) 100 UNIT/ML injection Inject 0.1 mLs (10 Units total) into the skin daily. (Patient taking differently: Inject 12 Units into the skin daily. ) 10 mL 1  . levothyroxine (SYNTHROID, LEVOTHROID) 75 MCG tablet Take 1 tablet (75 mcg total) by mouth daily. 90 tablet 3  . ondansetron (ZOFRAN-ODT) 8 MG disintegrating tablet DISSOLVE 1 TABLET ON OR UNDER THE TONGUE EVERY 8 HOURS AS NEEDED FOR NAUSEA 20 tablet 5  . PHENobarbital (LUMINAL) 97.2 MG tablet TAKE TWO TABLETS BY MOUTH ONCE DAILY ON  MON,  WED,  AND  FRI  AND  1  TABLET  DAILY  ON  OTHER  DAYS (Patient taking differently: Take  97.2-194.4 mg by mouth daily. TAKE TWO TABLETS BY MOUTH ONCE DAILY ON  MON,  WED,  AND  FRI  AND  1  TABLET  DAILY  ON  OTHER  DAYS) 135 tablet 3  . sitaGLIPtin (JANUVIA) 50 MG tablet Take 1 tablet (50 mg total) by mouth daily. 90 tablet 0  . terconazole (TERAZOL 3) 0.8 % vaginal cream Place 1 applicator vaginally at bedtime.   0  . zolpidem (AMBIEN) 10 MG tablet take 1 tablet by mouth at bedtime for sleep if needed (Patient taking differently: Take 10 mg by mouth at bedtime as needed for sleep. take 1 tablet by mouth at bedtime for sleep if needed) 30 tablet 0    Allergies:  Allergies  Allergen Reactions  . Compazine     Bad headache, vomiting   . Cymbalta [Duloxetine Hcl]     unknown  . Escitalopram Oxalate Other (See Comments)    Can't sleep  . Methadone Hives  . Nitrofuran Derivatives     unknown    Family History  Problem Relation Age of Onset  . Heart disease Father   . Diabetes Father     Social History:  reports that she has never smoked. She has never used smokeless tobacco. She reports that she does not drink alcohol or use drugs.  ROS: A complete review  of systems was performed.  All systems are negative except for pertinent findings as noted.  Physical Exam:  Vital signs in last 24 hours: Temp:  [98.7 F (37.1 C)] 98.7 F (37.1 C) (08/14 0623) Pulse Rate:  [89] 89 (08/14 0623) Resp:  [16] 16 (08/14 0623) BP: (135)/(64) 135/64 (08/14 0623) SpO2:  [97 %] 97 % (08/14 0623) General:  Alert and oriented, No acute distress HEENT: Normocephalic, atraumatic Neck: No JVD or lymphadenopathy Cardiovascular: Regular rate and rhythm Lungs: Clear bilaterally Abdomen: Soft, nontender, nondistended, no abdominal masses Back: No CVA tenderness Extremities: No edema Neurologic: Grossly intact  Laboratory Data:  Results for orders placed or performed during the hospital encounter of 05/14/16 (from the past 24 hour(s))  Glucose, capillary     Status: Abnormal   Collection  Time: 05/14/16  6:21 AM  Result Value Ref Range   Glucose-Capillary 195 (H) 65 - 99 mg/dL   No results found for this or any previous visit (from the past 240 hour(s)). Creatinine:  Recent Labs  05/07/16 1449 05/10/16 1400 05/11/16 1605  CREATININE 1.54* 1.73* 2.23*    Radiologic Imaging: No results found.  Impression/Assessment:  Right renal stones  Plan:  Right PCNL Jorja Loa 05/14/2016, 6:31 AM  Lillette Boxer. Ramez Arrona MD

## 2016-05-14 NOTE — Anesthesia Procedure Notes (Signed)
Procedure Name: Intubation Date/Time: 05/14/2016 8:46 AM Performed by: Deliah Boston Pre-anesthesia Checklist: Patient identified, Emergency Drugs available, Suction available and Patient being monitored Patient Re-evaluated:Patient Re-evaluated prior to inductionOxygen Delivery Method: Circle system utilized Preoxygenation: Pre-oxygenation with 100% oxygen Intubation Type: IV induction Ventilation: Two handed mask ventilation required and Oral airway inserted - appropriate to patient size Laryngoscope Size: Mac and 3 Grade View: Grade I Tube type: Oral Tube size: 7.0 mm Number of attempts: 1 Airway Equipment and Method: Oral airway Placement Confirmation: ETT inserted through vocal cords under direct vision,  positive ETCO2 and breath sounds checked- equal and bilateral Secured at: 20 cm Tube secured with: Tape Dental Injury: Teeth and Oropharynx as per pre-operative assessment

## 2016-05-14 NOTE — Transfer of Care (Signed)
Immediate Anesthesia Transfer of Care Note  Patient: Natalie [name]  Procedure(s) Performed: Procedure(s): NEPHROLITHOTOMY PERCUTANEOUS (Right)  Patient Location: PACU  Anesthesia Type:General  Level of Consciousness: Patient easily awoken, sedated, comfortable, cooperative, following commands, responds to stimulation.   Airway & Oxygen Therapy: Patient spontaneously breathing, ventilating well, oxygen via simple oxygen mask.  Post-op Assessment: Report given to PACU RN, vital signs reviewed and stable, moving all extremities.   Post vital signs: Reviewed and stable.  Complications: No apparent anesthesia complications

## 2016-05-14 NOTE — Progress Notes (Addendum)
Brief Pharmacy Note: Pharmacy may adjust antibiotic doses based on renal function (addendum to previous note by Reuel Boom, PharmD)  Patient ordered Bactrim DS and Fluconazole s/p right PCNL today. SCr elevated on 05/11/16 at 2.23 with CrCl ~ 19 ml/min.   Plan: Will reduce Bactrim DS to Bactrim single strength 400-80mg  PO q12h for CrCl < 30 ml/min. Fluconazole dose currently appropriate. F/u renal function in AM for any further dosage adjustments. Anticipate renal function to improve with removal of stones and therefore may require dosage adjustment at that time.    Lindell Spar, PharmD, BCPS Pager: [phone] 05/14/2016 2:22 PM

## 2016-05-14 NOTE — Progress Notes (Signed)
Pharmacy - Bactrim, Diflucan (renal adjust)  Assessment: 69 yoF s/p PCN placement and extraction of stones, started on Diflucan and Bactrim for suppression postoperatively.    SCr elevated acutely d/t renal insult.  CrCl 19 ml/min, baseline appears to be ~ 50 ml/min  Currently ordered Bactrim 1 DS tablet BID and Diflucan 100 mg daily  Plan:  Recheck SCr tomorrow. Expect to improve rapidly following relief of blockage/lithiasis  If CrCl is not > 30 ml/min by tomorrow, will need to reduce doses by half.  Reuel Boom, PharmD, BCPS Pager: [phone] 05/14/2016, 12:50 PM

## 2016-05-14 NOTE — Anesthesia Preprocedure Evaluation (Addendum)
Anesthesia Evaluation  Patient identified by MRN, date of birth, ID band Patient awake    Reviewed: Allergy & Precautions, NPO status , Patient's Chart, lab work & pertinent test results  Airway Mallampati: II  TM Distance: >3 FB Neck ROM: Full    Dental  (+) Teeth Intact, Dental Advisory Given   Pulmonary    breath sounds clear to auscultation       Cardiovascular  Rhythm:Regular Rate:Normal     Neuro/Psych    GI/Hepatic   Endo/Other  diabetes  Renal/GU      Musculoskeletal   Abdominal   Peds  Hematology   Anesthesia Other Findings   Reproductive/Obstetrics                            Anesthesia Physical Anesthesia Plan  ASA: III  Anesthesia Plan: General   Post-op Pain Management:    Induction: Intravenous  Airway Management Planned: Oral ETT  Additional Equipment:   Intra-op Plan:   Post-operative Plan:   Informed Consent: I have reviewed the patients History and Physical, chart, labs and discussed the procedure including the risks, benefits and alternatives for the proposed anesthesia with the patient or authorized representative who has indicated his/her understanding and acceptance.   Dental advisory given  Plan Discussed with: CRNA and Anesthesiologist  Anesthesia Plan Comments:        Anesthesia Quick Evaluation

## 2016-05-14 NOTE — Anesthesia Postprocedure Evaluation (Signed)
Anesthesia Post Note  Patient: Natalie [name]  Procedure(s) Performed: Procedure(s) (LRB): NEPHROLITHOTOMY PERCUTANEOUS (Right)  Patient location during evaluation: PACU Anesthesia Type: General Level of consciousness: awake, awake and alert and oriented Pain management: pain level controlled Vital Signs Assessment: post-procedure vital signs reviewed and stable Respiratory status: spontaneous breathing, nonlabored ventilation and respiratory function stable Cardiovascular status: blood pressure returned to baseline Anesthetic complications: no    Last Vitals:  Vitals:   05/14/16 1130 05/14/16 1145  BP: (!) 121/50 136/60  Pulse: 72 75  Resp: 13 11  Temp:      Last Pain:  Vitals:   05/14/16 1145  TempSrc:   PainSc: 4                  Lillion Elbert COKER

## 2016-05-14 NOTE — Anesthesia Postprocedure Evaluation (Signed)
Anesthesia Post Note  Patient: Natalie [name]  Procedure(s) Performed: Procedure(s) (LRB): NEPHROLITHOTOMY PERCUTANEOUS (Right)  Patient location during evaluation: PACU Anesthesia Type: General Level of consciousness: awake, oriented and awake and alert Pain management: pain level controlled Vital Signs Assessment: post-procedure vital signs reviewed and stable Respiratory status: spontaneous breathing, nonlabored ventilation and respiratory function stable Cardiovascular status: blood pressure returned to baseline Anesthetic complications: no    Last Vitals:  Vitals:   05/14/16 1212 05/14/16 1327  BP: (!) 141/55 127/60  Pulse: 81 77  Resp: 16 18  Temp: 36.8 C 36.7 C    Last Pain:  Vitals:   05/14/16 1509  TempSrc:   PainSc: 7                  Dulcy Sida COKER

## 2016-05-14 NOTE — Op Note (Signed)
Preoperative diagnosis: 23 mm right renal calculus, with smaller calyceal stones  Postoperative diagnosis: Same   Procedure: Percutaneous nephrolithotomy of a 23 mm right UPJ/renal pelvic stone with extraction of other calyceal stones, antegrade nephrostogram with fluoroscopic interpretation, placement of 5 mm x 24 cm contour double-J stent without string    Surgeon: Lillette Boxer. Avynn Klassen, M.D.   Anesthesia: Gen.   Complications: None  Specimen(s): Stones, the patient's family  Drain(s): 22 French Foley catheter and bladder, 24 cm x 5 French contour double-J stent  Indications: 65 year old female who initially presented last month with a symptomatic 23 mm right ureteropelvic junction stone as well as infection. She was treated with urgent nephrostomy tube placement as well as antibiotic management. She has been followed up since that time. She was, prostate 2 weeks ago, treated for Pseudomonas UTI appropriately with ciprofloxacin. She presents at this time, having completed her course of ciprofloxacin and on trimethoprim suppression, for percutaneous nephrolithotomy of her large right renal stone. Risks and complications of the procedure have been discussed with the patient's husband and herself. These include but are not limited to infection, bleeding, need for transfusion, loss of the kidney, anesthetic complications, pneumonia, among others. She understands these and desires to proceed.     Technique and findings:The patient was properly identified in the holding area and the correct surgical side was marked. She was taken to the operating room where general endotracheal anesthetic was administered with the patient supine. A Foley catheter was placed. She was then placed in the prone position, all extremities and pressure points were padded appropriately. The right flank and percutaneous nephrostomy tube were prepped and draped. Proper timeout was performed.  The tip of the nephrostomy tube  was cut, and the retention string was removed. I then passed a sensor-tip guidewire through the nephrostomy tube, which was, with the aid of fluoroscopy, as well as using a Kumpe catheter, advanced distally in the bladder. Prior to passage of the guidewire, using the Kumpe catheter, and antegrade nephrostogram was performed. This revealed the calyces to be decompressed. There was a filling defect in the lower pole calyx as well as the large stone which presented a filling defect in the renal pelvis. There was easy egress of contrast down the ureter without evidence of obstruction or filling defects. Once the access was obtained with this guidewire, the nephrostomy tube had been removed. The Kumpe catheter was also removed. I then passed the 10/12 peel-away catheter over top of the sensor-tip guidewire. The core was removed and the second working wire was passed into the bladder alongside the sensor-tip guidewire. The peel-away sheath was then removed. Over the working guidewire, the NephroMax balloon was passed so that the distal tip of the balloon was right at the renal pelvic stone. The balloon was inflated to 18 atm of pressure. Prior to this, an incision was made right beside the guidewire to allow for expansion of the incision. The 28 French nephrostomy access sheath was then passed over top of the balloon, up to the stone using fluoroscopic guidance. The balloon was deflated and removed.  The rigid nephroscope was then passed through the access sheath. The stone was encountered. It was fragmented into multiple smaller fragments using the Stonebreaker apparatus. The fragments were then grasped been removed. Multiple small fragments were carefully extracted. Following adequate fragmentation of a large, 23 mm stone, the flexible nephroscope was then passed. Calyces were inspected. 2-3 calyces had 1-2 stones within these-either native stones or stones that had  been fragmented from the larger stones. These were  grasped with the Nitinol basket and extracted. Careful inspection of all the calyces with the assistance of the fluoroscope was then again performed. No stones or stone fragments were seen. A couple small fragments were picked out of the ureteropelvic junction-these floated down there with irrigation. Once I was quite certain that there were no fragments left, either with fluoroscopy or with the flexible nephroscope, I then passed a 24 cm x 5 French contour double-J stent with the string removed down the ureter using the rigid nephroscope. Fluoroscopy revealed adequate positioning of the distal end in the bladder. The proximal end in the renal pelvis was coiled appropriately. The nephroscope was then removed. The distance from the edge of the renal parenchyma to the skin was measured at 9 cm. I then used FloSeal, with a long trocar, to close the tract between the edge of the renal parenchyma and the skin using 10 mL of this substance. There was adequate hemostasis. The safety guidewire was then removed. Skin edges were then reapproximated using 2-0 silk placed in the vertical mattress fashion. Dry sterile dressing was then placed.  The patient was then awakened, extubated, and taken to the PACU in stable condition. She tolerated the procedure well.

## 2016-05-15 DIAGNOSIS — N200 Calculus of kidney: Secondary | ICD-10-CM | POA: Diagnosis not present

## 2016-05-15 DIAGNOSIS — Z79890 Hormone replacement therapy (postmenopausal): Secondary | ICD-10-CM | POA: Diagnosis not present

## 2016-05-15 DIAGNOSIS — E119 Type 2 diabetes mellitus without complications: Secondary | ICD-10-CM | POA: Diagnosis not present

## 2016-05-15 DIAGNOSIS — I10 Essential (primary) hypertension: Secondary | ICD-10-CM | POA: Diagnosis not present

## 2016-05-15 DIAGNOSIS — Z794 Long term (current) use of insulin: Secondary | ICD-10-CM | POA: Diagnosis not present

## 2016-05-15 DIAGNOSIS — E785 Hyperlipidemia, unspecified: Secondary | ICD-10-CM | POA: Diagnosis not present

## 2016-05-15 LAB — HEMOGLOBIN AND HEMATOCRIT, BLOOD
HCT: 27.3 % — ABNORMAL LOW (ref 36.0–46.0)
HEMOGLOBIN: 8.3 g/dL — AB (ref 12.0–15.0)

## 2016-05-15 LAB — CREATININE, SERUM
CREATININE: 1.6 mg/dL — AB (ref 0.44–1.00)
GFR, EST AFRICAN AMERICAN: 37 mL/min — AB (ref >60)
GFR, EST NON AFRICAN AMERICAN: 32 mL/min — AB (ref >60)

## 2016-05-15 LAB — GLUCOSE, CAPILLARY: Glucose-Capillary: 129 mg/dL — ABNORMAL HIGH (ref 65–99)

## 2016-05-15 MED ORDER — OXYCODONE HCL 5 MG PO TABS: 5.0000 mg | ORAL_TABLET | ORAL | 0 refills | Status: DC | PRN

## 2016-05-15 MED ORDER — FLUCONAZOLE 100 MG PO TABS: 100.0000 mg | ORAL_TABLET | Freq: Every day | ORAL | 0 refills | Status: DC

## 2016-05-15 NOTE — Progress Notes (Signed)
Patient is stable for discharge. Discharge instructions and medications have been reviewed with the patient and all questions answered. Right flank dressing clean, dry and intact and extra dressing supplies sent home with patient.

## 2016-05-15 NOTE — Discharge Instructions (Signed)

## 2016-05-15 NOTE — Discharge Summary (Signed)
Patient ID: Natalie [name] MRN: [id number] DOB/AGE: 1951/01/13 65 y.o.  Admit date: 05/14/2016 Discharge date: 05/15/2016  Primary Care Physician:  Wendie Agreste, MD  Discharge Diagnoses:  Right renal calculi  Consults:  None   Discharge Medications:   Medication List    TAKE these medications   amLODipine 5 MG tablet Commonly known as:  NORVASC Take 1 tablet (5 mg total) by mouth daily.   atorvastatin 20 MG tablet Commonly known as:  LIPITOR Take 1 tablet (20 mg total) by mouth daily.   butorphanol 10 MG/ML nasal spray Commonly known as:  STADOL Place 1 spray into the nose every 4 (four) hours as needed for migraine. May refill weekly. To follow last prescription of 02/29/16. Awaiting transfer to Pain Management program at [facility].   clobetasol cream 0.05 % Commonly known as:  TEMOVATE Apply topically 2 (two) times daily.   ESTRACE VAGINAL 0.1 MG/GM vaginal cream Generic drug:  estradiol insert 1/4 gram vaginally two times a week   fluconazole 100 MG tablet Commonly known as:  DIFLUCAN Take 1 tablet (100 mg total) by mouth daily.   insulin glargine 100 UNIT/ML injection Commonly known as:  LANTUS Inject 0.1 mLs (10 Units total) into the skin daily. What changed:  how much to take   levothyroxine 75 MCG tablet Commonly known as:  SYNTHROID, LEVOTHROID Take 1 tablet (75 mcg total) by mouth daily.   ondansetron 8 MG disintegrating tablet Commonly known as:  ZOFRAN-ODT DISSOLVE 1 TABLET ON OR UNDER THE TONGUE EVERY 8 HOURS AS NEEDED FOR NAUSEA   oxyCODONE 5 MG immediate release tablet Commonly known as:  Oxy IR/ROXICODONE Take 1 tablet (5 mg total) by mouth every 4 (four) hours as needed for moderate pain.   PHENobarbital 97.2 MG tablet Commonly known as:  LUMINAL TAKE TWO TABLETS BY MOUTH ONCE DAILY ON  MON,  WED,  AND  FRI  AND  1  TABLET  DAILY  ON  OTHER  DAYS What changed:  how much to take  how to take this  when to take this  additional  instructions   sitaGLIPtin 50 MG tablet Commonly known as:  JANUVIA Take 1 tablet (50 mg total) by mouth daily.   terconazole 0.8 % vaginal cream Commonly known as:  TERAZOL 3 Place 1 applicator vaginally at bedtime.   zolpidem 10 MG tablet Commonly known as:  AMBIEN take 1 tablet by mouth at bedtime for sleep if needed What changed:  how much to take  how to take this  when to take this  reasons to take this  additional instructions        Significant Diagnostic Studies:  No results found.  Brief H and P: For complete details please refer to admission H and P, but in brief the pt was admitted for PCNL of a large right renal stone burden.  Hospital Course: . Pt underwent right PCNL on 8/14. She had an uncomplicated postop course and was d/ced to home on POD 1.  Day of Discharge BP (!) 98/48 (BP Location: Right Arm)   Pulse 100   Temp 99.8 F (37.7 C) (Oral)   Resp 20   Ht 4\' 8"  (1.422 m)   Wt 67.7 kg (149 lb 4.8 oz)   SpO2 96%   BMI 33.47 kg/m   Results for orders placed or performed during the hospital encounter of 05/14/16 (from the past 24 hour(s))  Glucose, capillary     Status: Abnormal   Collection Time: 05/14/16  8:17 AM  Result Value Ref Range   Glucose-Capillary 169 (H) 65 - 99 mg/dL  Glucose, capillary     Status: Abnormal   Collection Time: 05/14/16 11:04 AM  Result Value Ref Range   Glucose-Capillary 171 (H) 65 - 99 mg/dL  Glucose, capillary     Status: Abnormal   Collection Time: 05/14/16  4:33 PM  Result Value Ref Range   Glucose-Capillary 148 (H) 65 - 99 mg/dL   Comment 1 Notify RN   Glucose, capillary     Status: Abnormal   Collection Time: 05/14/16  9:38 PM  Result Value Ref Range   Glucose-Capillary 204 (H) 65 - 99 mg/dL  Hemoglobin and hematocrit, blood     Status: Abnormal   Collection Time: 05/15/16  5:50 AM  Result Value Ref Range   Hemoglobin 8.3 (L) 12.0 - 15.0 g/dL   HCT 27.3 (L) 36.0 - 46.0 %  Creatinine, serum      Status: Abnormal   Collection Time: 05/15/16  5:50 AM  Result Value Ref Range   Creatinine, Ser 1.60 (H) 0.44 - 1.00 mg/dL   GFR calc non Af Amer 32 (L) >60 mL/min   GFR calc Af Amer 37 (L) >60 mL/min    Physical Exam: General: Alert and awake oriented x3 not in any acute distress. HEENT: anicteric sclera, pupils reactive to light and accommodation CVS: S1-S2 clear no murmur rubs or gallops Chest: clear to auscultation bilaterally, no wheezing rales or rhonchi Abdomen: soft nontender, nondistended, normal bowel sounds, no organomegaly Extremities: no cyanosis, clubbing or edema noted bilaterally Neuro: Cranial nerves II-XII intact, no focal neurological deficits  Disposition:  Home  Diet: Limited carbs  Activity:  Gradulally increase   Disposition and Follow-up:    followup as scheduled    DISCHARGE FOLLOW-UP   Time spent on Discharge:  15 mins  Signed: Jorja Loa 05/15/2016, 7:46 AM

## 2016-05-22 MED ORDER — ONDANSETRON 8 MG PO TBDP
ORAL_TABLET | ORAL | 0 refills | Status: DC
Start: 2016-05-22 — End: 2016-05-31

## 2016-05-22 NOTE — Telephone Encounter (Signed)
My understanding from Dr. Ninfa Meeker plan on retiring is that Dr. Doy Hutching was her new primary care provider. I saw her for acute kidney injury and diabetes issues recently.  I am unable to find recent discussion of Zofran. I did send in one refill for #20, but this needs to be discussed either with her primary care provider Dr. Doy Hutching, or if she is being followed here, return to discuss this medication further prior to this Rx running out.

## 2016-05-22 NOTE — Telephone Encounter (Signed)
Pharm reqs RFs of ondansetron ODT 8 mg. Dr Carlota Raspberry, I see you are pt's new PCP, and that she has had acute kidney issues lately. I haven't seen this med discussed, so wanted to have you review to see if you want pt taking this for her recurring nausea. Dr Laney Pastor has been Rxing it regularly for pt previously.

## 2016-05-24 NOTE — Telephone Encounter (Signed)
Called pt who reported that Dr Doy Hutching did not work out for her, even before Dr Laney Pastor left. She does however have an appt w/Dr Brigitte Pulse on 8/31 to est care. Advised pt to be sure to bring in her current medications and discuss all with Dr Brigitte Pulse.

## 2016-05-29 DIAGNOSIS — N200 Calculus of kidney: Secondary | ICD-10-CM | POA: Diagnosis not present

## 2016-05-29 DIAGNOSIS — Z87442 Personal history of urinary calculi: Secondary | ICD-10-CM | POA: Diagnosis not present

## 2016-05-30 NOTE — Progress Notes (Signed)
Subjective:    Patient ID: Natalie [name], female    DOB: 1951/01/22, 65 y.o.   MRN: [id number] Chief Complaint  Patient presents with  . Establish Care  . Flu Vaccine    wants to discuss if she can have the flu shot with provider     HPI  Natalie [name] is a 65 yo woman who is a patient of my now retired Medical laboratory scientific officer Natalie [name].  At her last routine visit with Natalie [name] she scheduled this appointment with me to establish. However, since that time she has had several severe life-threatening events ending up in recurrent hospitalizations.  She has mainly been seeing Natalie [name] though I did meed her once during this itme.  Urology office did labs 2d ago and toler her K was up.  SHe is not on any diuretics.  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 Has to get enough sleep - no sz in many years.  She has a new patient appointment with a pain clininc in 3 wks - 09/19 though [facility]  Chronic medical conditions: Chronic migraines:Has been on stadol nasal spray many years to abort - going to have to go to pain management for this now.  She uses 1 bottle/wk of stadol.  On phenobarbital for epilespy -  Type 2 IDDM:  a1c 6 wks prior was 7.7 <- 7.2 <- 7.1 <- 8.6.  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  On latus 10u qhs and Januvia 50.  She was on metformin and an acei but there were help during her recent kidney stone causing ARF.  She was not able to afford a refill on Januvia - so she stopped 2 wks ago.  She is taking 10u of lantus intermittently. Wont take it if her sugar is good Recent kidney stone with hydronephrosis and pyelo resulting in sepsis.  Seeing urology Natalie [name] CKD: severe worsened recently - over the past mo, cr has increased forproblems m 1.54 to 2.23 - was decreased to 1.6 2 wks ago  Anemia: worsening with recent kidney - 10.5 1 mop rior -> 8.3 2 wks ago. b12 low normal 08/2014  HTN: on amlodipine 5  HPL: on atorvastatin 20.  Lipid  panel at goal 4 mos piror with LDL 74 and non-HDL of 97.  LFTs nml 3 wks piror  Insomnia: ambien 10 qhs from Natalie [name] for >10 years - nothing else works.  Hypothyroid: On levothyroxine 75 mcg qd - last tsh nml 5 mos prior.  Has been stable on same dose for > 4 years  Depression screen [facility] 2/9 05/31/2016 05/11/2016 05/08/2016 05/07/2016 04/27/2016  Decreased Interest 0 0 0 0 0  Down, Depressed, Hopeless 1 1 0 0 0  PHQ - 2 Score 1 1 0 0 0   Past Medical History:  Diagnosis Date  . Chronic kidney disease    kidney stones, right and left  . Diabetes mellitus without complication ([place])   . Headache    migraines  . Hyperlipidemia   . Hypertension   . Seizures ([place])    none x 30 yrs   Past Surgical History:  Procedure Laterality Date  . CESAREAN SECTION    . CHOLECYSTECTOMY    . KIDNEY STONE SURGERY    . NEPHROLITHOTOMY Right 05/14/2016   Procedure: NEPHROLITHOTOMY PERCUTANEOUS;  Surgeon: Franchot Gallo, MD;  Location: [facility];  Service: Urology;  Laterality: Right;  . SHOULDER ARTHROSCOPY Bilateral    Current Outpatient  Prescriptions on File Prior to Visit  Medication Sig Dispense Refill  . atorvastatin (LIPITOR) 20 MG tablet Take 1 tablet (20 mg total) by mouth daily. 90 tablet 3  . ESTRACE VAGINAL 0.1 MG/GM vaginal cream insert 1/4 gram vaginally two times a week  0  . levothyroxine (SYNTHROID, LEVOTHROID) 75 MCG tablet Take 1 tablet (75 mcg total) by mouth daily. 90 tablet 3  . terconazole (TERAZOL 3) 0.8 % vaginal cream Place 1 applicator vaginally at bedtime.   0   No current facility-administered medications on file prior to visit.    Allergies  Allergen Reactions  . Compazine     Bad headache, vomiting   . Cymbalta [Duloxetine Hcl]     unknown  . Escitalopram Oxalate Other (See Comments)    Can't sleep  . Methadone Hives  . Nitrofuran Derivatives     unknown   Family History  Problem Relation Age of Onset  . Heart disease Father   . Diabetes Father    Social  History   Social History  . Marital status: Married    Spouse name: N/A  . Number of children: N/A  . Years of education: N/A   Social History Main Topics  . Smoking status: Never Smoker  . Smokeless tobacco: Never Used  . Alcohol use No  . Drug use: No  . Sexual activity: Not Asked   Other Topics Concern  . None   Social History Narrative  . None     Review of Systems  Constitutional: Positive for activity change, appetite change and fatigue. Negative for chills, diaphoresis and fever.  Eyes: Negative for visual disturbance.  Respiratory: Negative for cough and shortness of breath.   Cardiovascular: Negative for chest pain, palpitations and leg swelling.  Gastrointestinal: Negative for abdominal pain.  Genitourinary: Negative for decreased urine volume, difficulty urinating, dysuria, flank pain and hematuria.  Musculoskeletal: Positive for arthralgias.  Neurological: Positive for headaches. Negative for seizures, syncope and numbness.  Hematological: Does not bruise/bleed easily.  Psychiatric/Behavioral: Negative for sleep disturbance.       Objective:   Physical Exam  Constitutional: She is oriented to person, place, and time. She appears well-developed and well-nourished. No distress.  HENT:  Head: Normocephalic and atraumatic.  Right Ear: External ear normal.  Left Ear: External ear normal.  Eyes: Conjunctivae are normal. No scleral icterus.  Neck: Normal range of motion. Neck supple. No thyromegaly present.  Cardiovascular: Normal rate, regular rhythm, normal heart sounds and intact distal pulses.   Pulmonary/Chest: Effort normal and breath sounds normal. No respiratory distress.  Musculoskeletal: She exhibits no edema.  Lymphadenopathy:    She has no cervical adenopathy.  Neurological: She is alert and oriented to person, place, and time.  Skin: Skin is warm and dry. She is not diaphoretic. No erythema.  Psychiatric: She has a normal mood and affect. Her  behavior is normal.      BP 114/80 (BP Location: Right Arm, Patient Position: Sitting, Cuff Size: Normal)   Pulse 79   Temp 98 F (36.7 C) (Oral)   Resp 18   Ht _0  (1.422 m)   Wt 149 lb (67.6 kg)   SpO2 100%   BMI 33.41 kg/m      Assessment & Plan:  needs urine microalb, bmp with grf Cbc, vit B12, ferritin, folate, retics? tsh 1. Insomnia   Has been on zolpidem 20m qhs >13 yrs. Pt willing to try to decrease dose.  2.    Migraine -  has appt to try to transfer to pain clinic to receive the stadol as this is the only thing that works - uses a bottle/wk on average - pt is very concerned about this experience and cost.  Refilled one time  3. Sz d/o - stable on phenobarbitoal for decades  4. HTN  5. Type 2 DM - had to hold metformin when in ARF, can't afford others.  Stopped lantus - try SU instead  6. Hypothyroid  7. HPL  Meds ordered this encounter  Medications  . zolpidem (AMBIEN) 5 MG tablet    Sig: take 1 tablet by mouth at bedtime for sleep if needed. May repeat x 1 in 1 hr if needed    Dispense:  60 tablet    Refill:  5  . butorphanol (STADOL) 10 MG/ML nasal spray    Sig: Place 1 spray into the nose every 4 (four) hours as needed for migraine. May refill weekly.    Dispense:  2.5 mL    Refill:  5  . amLODipine (NORVASC) 5 MG tablet    Sig: Take 1 tablet (5 mg total) by mouth daily.    Dispense:  90 tablet    Refill:  1  . ondansetron (ZOFRAN-ODT) 8 MG disintegrating tablet    Sig: DISSOLVE 1 TABLET ON OR UNDER THE TONGUE EVERY 8 HOURS AS NEEDED FOR NAUSEA    Dispense:  20 tablet    Refill:  5  . PHENobarbital (LUMINAL) 97.2 MG tablet    Sig: Take 1-2 tablets (97.2-194.4 mg total) by mouth daily. TAKE TWO TABLETS BY MOUTH ONCE DAILY ON  MON,  WED,  AND  FRI  AND  1  TABLET  DAILY  ON  OTHER  DAYS    Dispense:  135 tablet    Refill:  1  . DISCONTD: glipiZIDE (GLUCOTROL) 5 MG tablet    Sig: Take 1 tablet (5 mg total) by mouth 2 (two) times daily before a  meal.    Dispense:  60 tablet    Refill:  0  . glipiZIDE (GLUCOTROL) 5 MG tablet    Sig: Take 1 tablet (5 mg total) by mouth 2 (two) times daily before a meal.    Dispense:  180 tablet    Refill:  0  . blood glucose meter kit and supplies KIT    Sig: Dispense based on patient and insurance preference. Use up to four times daily as directed. (FOR ICD-9 250.00, 250.01).    Dispense:  1 each    Refill:  0    Please dispense whichever meter and accompanying strips pt and insurance prefers    Order Specific Question:   Number of strips    Answer:   1000    Order Specific Question:   Number of lancets    Answer:   1000    Delman Cheadle, M.D.  [facility] [address] [place],  [address] [phone] phone [phone] fax  06/04/16 12:38 PM

## 2016-05-31 ENCOUNTER — Encounter: Payer: Self-pay | Admitting: Family Medicine

## 2016-05-31 ENCOUNTER — Ambulatory Visit (INDEPENDENT_AMBULATORY_CARE_PROVIDER_SITE_OTHER): Payer: Medicare Other | Admitting: Family Medicine

## 2016-05-31 VITALS — BP 114/80 | HR 79 | Temp 98.0°F | Resp 18 | Ht <= 58 in | Wt 149.0 lb

## 2016-05-31 DIAGNOSIS — I10 Essential (primary) hypertension: Secondary | ICD-10-CM | POA: Diagnosis not present

## 2016-05-31 DIAGNOSIS — E1129 Type 2 diabetes mellitus with other diabetic kidney complication: Secondary | ICD-10-CM

## 2016-05-31 DIAGNOSIS — G40909 Epilepsy, unspecified, not intractable, without status epilepticus: Secondary | ICD-10-CM

## 2016-05-31 DIAGNOSIS — E785 Hyperlipidemia, unspecified: Secondary | ICD-10-CM

## 2016-05-31 DIAGNOSIS — E039 Hypothyroidism, unspecified: Secondary | ICD-10-CM | POA: Diagnosis not present

## 2016-05-31 DIAGNOSIS — G43711 Chronic migraine without aura, intractable, with status migrainosus: Secondary | ICD-10-CM | POA: Diagnosis not present

## 2016-05-31 DIAGNOSIS — G4700 Insomnia, unspecified: Secondary | ICD-10-CM

## 2016-05-31 DIAGNOSIS — Z23 Encounter for immunization: Secondary | ICD-10-CM | POA: Diagnosis not present

## 2016-05-31 MED ORDER — AMLODIPINE BESYLATE 5 MG PO TABS: 5.0000 mg | ORAL_TABLET | Freq: Every day | ORAL | 1 refills | Status: DC

## 2016-05-31 MED ORDER — GLIPIZIDE 5 MG PO TABS: 5.0000 mg | ORAL_TABLET | Freq: Two times a day (BID) | ORAL | 0 refills | Status: DC

## 2016-05-31 MED ORDER — BLOOD GLUCOSE MONITOR KIT: PACK | 0 refills | Status: DC

## 2016-05-31 MED ORDER — BUTORPHANOL TARTRATE 10 MG/ML NA SOLN: 1.0000 | NASAL | 5 refills | Status: DC | PRN

## 2016-05-31 MED ORDER — ONDANSETRON 8 MG PO TBDP: ORAL_TABLET | ORAL | 5 refills | Status: DC

## 2016-05-31 MED ORDER — PHENOBARBITAL 97.2 MG PO TABS: 97.2000 mg | ORAL_TABLET | Freq: Every day | ORAL | 1 refills | Status: DC

## 2016-05-31 MED ORDER — ZOLPIDEM TARTRATE 5 MG PO TABS: ORAL_TABLET | ORAL | 5 refills | Status: DC

## 2016-05-31 NOTE — Patient Instructions (Addendum)
IF you received an x-ray today, you will receive an invoice from [facility]. Please contact [facility] at [phone] with questions or concerns regarding your invoice.   IF you received labwork today, you will receive an invoice from [employer]. Please contact [facility] at [phone] with questions or concerns regarding your invoice.   Our billing staff will not be able to assist you with questions regarding bills from these companies.  You will be contacted with the lab results as soon as they are available. The fastest way to get your results is to activate your My Chart account. Instructions are located on the last page of this paperwork. If you have not heard from [place] regarding the results in 2 weeks, please contact this office.     1. You will need to be seen for an office visit every 3 months.  Hopefully, all prescriptions can be issued at that time so that you do not need to call in for refill requests 2.  If for some reason, you are not issued all of your prescriptions at a visit, please allow a 72 hour or 3 day window to respond to your refill requests. This is especially important if you need to pick up a prescription over the weekend, so make sure you call during the week prior (e.g. Call by Thursday if you want to be able to pick up a prescription by Sunday). 3. You may only fill your prescriptions at one pharmacy. If you are traveling out of town for some reason during a time that you will need a refill filled, I request that you notify our office and stick to one chain - such was [facility], [facility], [facility], etc. 4. You may not receive controlled substances from any other provider. If there is an acute condition for which you are seen elsewhere (e.g. you go to the ER after a car accident), please alert the provider that you are on a controlled substance contract and if you are still prescribed a controlled substance, you alert our office  within 24 hours of filling it.   5. If you are planning to be seen at our [facility] walk-in clinic for refills, please call ahead of time to confirm that I will be working and please arrive 2 hours prior to the end of my shift to guarantee that you will be seen by myself.  If you are seen at our office for another reason or I am out of the office for an extended period, some providers may be willing to provide a refill on your medications but others are not. It is left up to each provider's discretion at the time of the visit.   6. Please treat our staff with the respect with which you would like to be treated. I know that our medical system can be frustrating but they are doing their job to the best of their abilities as they were instructed.  Blood Glucose Monitoring, Adult Monitoring your blood glucose (also know as blood sugar) helps you to manage your diabetes. It also helps you and your health care provider monitor your diabetes and determine how well your treatment plan is working. WHY SHOULD YOU MONITOR YOUR BLOOD GLUCOSE?  It can help you understand how food, exercise, and medicine affect your blood glucose.  It allows you to know what your blood glucose is at any given moment. You can quickly tell if you are having low blood glucose (hypoglycemia) or high blood glucose (  hyperglycemia).  It can help you and your health care provider know how to adjust your medicines.  It can help you understand how to manage an illness or adjust medicine for exercise. WHEN SHOULD YOU TEST? Your health care provider will help you decide how often you should check your blood glucose. This may depend on the type of diabetes you have, your diabetes control, or the types of medicines you are taking. Be sure to write down all of your blood glucose readings so that this information can be reviewed with your health care provider. See below for examples of testing times that your health care provider may suggest. Type 1  Diabetes  Test at least 2 times per day if your diabetes is well controlled, if you are using an insulin pump, or if you perform multiple daily injections.  If your diabetes is not well controlled or if you are sick, you may need to test more often.  It is a good idea to also test:  Before every insulin injection.  Before and after exercise.  Between meals and 2 hours after a meal.  Occasionally between 2:00 a.m. and 3:00 a.m. Type 2 Diabetes  If you are taking insulin, test at least 2 times per day. However, it is best to test before every insulin injection.  If you take medicines by mouth (orally), test 2 times a day.  If you are on a controlled diet, test once a day.  If your diabetes is not well controlled or if you are sick, you may need to monitor more often. HOW TO MONITOR YOUR BLOOD GLUCOSE Supplies Needed  Blood glucose meter.  Test strips for your meter. Each meter has its own strips. You must use the strips that go with your own meter.  A pricking needle (lancet).  A device that holds the lancet (lancing device).  A journal or log book to write down your results. Procedure  Wash your hands with soap and water. Alcohol is not preferred.  Prick the side of your finger (not the tip) with the lancet.  Gently milk the finger until a small drop of blood appears.  Follow the instructions that come with your meter for inserting the test strip, applying blood to the strip, and using your blood glucose meter. Other Areas to Get Blood for Testing Some meters allow you to use other areas of your body (other than your finger) to test your blood. These areas are called alternative sites. The most common alternative sites are:  The forearm.  The thigh.  The back area of the lower leg.  The palm of the hand. The blood flow in these areas is slower. Therefore, the blood glucose values you get may be delayed, and the numbers are different from what you would get from  your fingers. Do not use alternative sites if you think you are having hypoglycemia. Your reading will not be accurate. Always use a finger if you are having hypoglycemia. Also, if you cannot feel your lows (hypoglycemia unawareness), always use your fingers for your blood glucose checks. ADDITIONAL TIPS FOR GLUCOSE MONITORING  Do not reuse lancets.  Always carry your supplies with you.  All blood glucose meters have a 24-hour "hotline" number to call if you have questions or need help.  Adjust (calibrate) your blood glucose meter with a control solution after finishing a few boxes of strips. BLOOD GLUCOSE RECORD KEEPING It is a good idea to keep a daily record or log of your  blood glucose readings. Most glucose meters, if not all, keep your glucose records stored in the meter. Some meters come with the ability to download your records to your home computer. Keeping a record of your blood glucose readings is especially helpful if you are wanting to look for patterns. Make notes to go along with the blood glucose readings because you might forget what happened at that exact time. Keeping good records helps you and your health care provider to work together to achieve good diabetes management.    This information is not intended to replace advice given to you by your health care provider. Make sure you discuss any questions you have with your health care provider.   Document Released: 09/20/2003 Document Revised: 10/08/2014 Document Reviewed: 02/09/2013 Elsevier Interactive Patient Education [employer].

## 2016-06-01 DIAGNOSIS — N182 Chronic kidney disease, stage 2 (mild): Secondary | ICD-10-CM | POA: Diagnosis not present

## 2016-06-01 DIAGNOSIS — E790 Hyperuricemia without signs of inflammatory arthritis and tophaceous disease: Secondary | ICD-10-CM | POA: Diagnosis not present

## 2016-06-01 DIAGNOSIS — I129 Hypertensive chronic kidney disease with stage 1 through stage 4 chronic kidney disease, or unspecified chronic kidney disease: Secondary | ICD-10-CM | POA: Diagnosis not present

## 2016-06-01 DIAGNOSIS — N200 Calculus of kidney: Secondary | ICD-10-CM | POA: Diagnosis not present

## 2016-06-01 DIAGNOSIS — E875 Hyperkalemia: Secondary | ICD-10-CM | POA: Diagnosis not present

## 2016-06-01 DIAGNOSIS — N390 Urinary tract infection, site not specified: Secondary | ICD-10-CM | POA: Diagnosis not present

## 2016-06-01 DIAGNOSIS — N179 Acute kidney failure, unspecified: Secondary | ICD-10-CM | POA: Diagnosis not present

## 2016-06-05 ENCOUNTER — Telehealth: Payer: Self-pay

## 2016-06-05 DIAGNOSIS — E1129 Type 2 diabetes mellitus with other diabetic kidney complication: Secondary | ICD-10-CM

## 2016-06-05 NOTE — Telephone Encounter (Signed)
Patient stated she need a referral to [facility]. She stated her numbers are going down and she doesn't have a clue what to eat. [phone].

## 2016-06-06 NOTE — Telephone Encounter (Signed)
Call patient. If she is having symptomatic hypoglycemia, needs to return to the office right away, or emergency room. Otherwise I will route this to Dr. Brigitte Pulse as she last saw patient one week ago, and it appears that she will be following her for primary care. If not, please let me know and I can provide referral until we figure who her primary care provider will be. (I know there was a question whether or not she was going to be followed by Dr. Doy Hutching or at our office.)

## 2016-06-06 NOTE — Telephone Encounter (Signed)
Referred to diabetic education.  She should eat lean meats and vegetables (other than potatoes or corn).  No juice but whole fruits are ok.  Try to eliminate white starches such as potatoes (sweet potatoes are ok), rice, bread, and pasta.  Eat less sugar.  Use whole grain or wheat products whenever available and try to reduce all carbohydrates to <25% of your diet.  Whenever you eat fruit, make sure it is the whole fruit (like in a smoothie) so you get fiber of the fruit (unlike in a juice where you just get the sugar of the fruit). When you eat fruit, try to choose more low glycemic index fruits (cherries, grapefruit, apricots, pears, apples, oranges, plums, strawberries, peaches, and grapes are some of the better ones for blood sugar.) If she would like more details, she is welcome to drop by clinic for some specific dietary guidelines handouts or she can check out the ADA website. If her sugars are <70 and she is feeling lightheaded, shaky, sweaty, nauseas, etc than we have her on to much medications - this is very dangerous and her meds need to be adjusted asap after she treats the episode with juice, soda, candy, followed by a protein like peanut butter.

## 2016-06-07 ENCOUNTER — Telehealth: Payer: Self-pay

## 2016-06-07 ENCOUNTER — Encounter: Payer: Self-pay | Admitting: Dietician

## 2016-06-07 ENCOUNTER — Encounter: Payer: Medicare Other | Attending: Family Medicine | Admitting: Dietician

## 2016-06-07 DIAGNOSIS — Z6832 Body mass index (BMI) 32.0-32.9, adult: Secondary | ICD-10-CM | POA: Diagnosis not present

## 2016-06-07 DIAGNOSIS — E785 Hyperlipidemia, unspecified: Secondary | ICD-10-CM | POA: Insufficient documentation

## 2016-06-07 DIAGNOSIS — Z87442 Personal history of urinary calculi: Secondary | ICD-10-CM | POA: Insufficient documentation

## 2016-06-07 DIAGNOSIS — Z713 Dietary counseling and surveillance: Secondary | ICD-10-CM | POA: Diagnosis not present

## 2016-06-07 DIAGNOSIS — N179 Acute kidney failure, unspecified: Secondary | ICD-10-CM | POA: Insufficient documentation

## 2016-06-07 DIAGNOSIS — I10 Essential (primary) hypertension: Secondary | ICD-10-CM | POA: Insufficient documentation

## 2016-06-07 DIAGNOSIS — E119 Type 2 diabetes mellitus without complications: Secondary | ICD-10-CM | POA: Insufficient documentation

## 2016-06-07 DIAGNOSIS — E1129 Type 2 diabetes mellitus with other diabetic kidney complication: Secondary | ICD-10-CM

## 2016-06-07 NOTE — Patient Instructions (Signed)
Avoid skipping meals. Choose a small amount of protein and carbohydrate each time you eat. See the list for what foods to limit that are high potassium. Check your blood sugar as recommended.  Alternate the times that you check. Continue a low sodium diet. Read the label for any seasonings and avoid those that have sodium or potassium in the ingredient list.  Grocery shopping tips:  Choose fresh meat rather than frozen.  Choose fresh or frozen vegetables without added salt or canned vegetables that say no salt added.  Choose fresh fruit or fruit canned in it's own juice.  Pasta, noodles, white rice are fine.  1/2 cup is the serving size.  You may have 1-2 servings.  Choose white bread.  1 ounce low sodium cheese is OK.  Breakfast ideas  White toast, egg, fruit, 1/2 cup lite yogurt (not greek)  White toast, low sodium cheese, 1 egg, fruit  White toast, low sodium cheese, fruit  White toast, low sodium [place] sausage, fruit    Lunch ideas:  Sandwich on white bread (homemade chicken or [place] salad, leftover roast or chicken, or cheese and 1 slice tomato) AND fruit  Homemade chicken and rice or chicken and noodle soup, small salad, 1 slice bread with butter and fruit  Leftovers  Beef stew made with onions and carrots over rice or noodles or with unsalted top crackers   Dinner ideas:  2 ounces lean meat, 1 cup pasta, 1/2 cup spaghetti sauce, 1/2 cup veges or fruit  2 ounce lean meat, 1/2 cup rice, 1/2 cup vege and fruit or more vegetable  Thin hamburger on a white bun with mayo with lettuce, fruit or vegetable

## 2016-06-07 NOTE — Telephone Encounter (Signed)
Notified pt that ref has been put in for DM ed. Also discussed Dr Raul Del note, went over diet suggestions and Sxs of low BS. Pt reported that her BS has been lower than it had been when really high but never under 100. She stated that the nephrologist has her on a special diet d/t her decreased renal function and it is difficult to find foods that she can eat on both diets. I advised that if she can not get into see nutritionalist soon she can come in here, but to bring the special diet her renal MD put her on. Pt agreed.

## 2016-06-07 NOTE — Progress Notes (Signed)
Diabetes Self-Management Education  Visit Type: First/Initial  Appt. Start Time: 1300 Appt. End Time: 1430  06/07/2016  [name], identified by name and date of birth, is a 65 y.o. female with a diagnosis of Diabetes: Type 2. Acute Kidney injury from kidney stones with recent infection and hospitalization, HTN, hyperlipidemia.  Other labs include 05/11/16:  Potassium 5.3, BUN 27, Creatinine:  2.23 with EGFR 8/15 of 32.  She reports elevated potassium on the last lab work at another office as well.  She states "I don't know what to eat or buy and have a good appetite but not eating well because I don't know what to eat."  "The lists conflict.  Seems like the only thing left to eat is meat."  Medications include Glipizide.  She could not afford the Januvia and Metformin was discontinued to the kidney issues.  Patient lives with her husband, daughter and granddaughter.  She does the shopping and cooking.  Her husband is often out of town due to work.  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  ASSESSMENT  Height _0  (1.422 m), weight 144 lb (65.3 kg). Body mass index is 32.28 kg/m.      Diabetes Self-Management Education - 06/07/16 1320      Visit Information   Visit Type First/Initial     Initial Visit   Diabetes Type Type 2   Are you currently following a meal plan? No   Are you taking your medications as prescribed? Yes   Date Diagnosed about 2017     Health Coping   How would you rate your overall health? Good     Psychosocial Assessment   Patient Belief/Attitude about Diabetes Motivated to manage diabetes   Self-care barriers None   Self-management support Doctor's office;Family   Other persons present Patient   Patient Concerns Nutrition/Meal planning;Other (comment)  including nutrition for kidney's and kidney stones   Special Needs None   Preferred Learning Style No preference indicated   Learning Readiness Ready   How often do you need to have someone help  you when you read instructions, pamphlets, or other written materials from your doctor or pharmacy? 1 - Never   What is the last grade level you completed in school? 2 years college     Pre-Education Assessment   Patient understands the diabetes disease and treatment process. Needs Review   Patient understands incorporating nutritional management into lifestyle. Needs Instruction   Patient undertands incorporating physical activity into lifestyle. Needs Review   Patient understands using medications safely. Needs Review   Patient understands monitoring blood glucose, interpreting and using results Needs Review   Patient understands prevention, detection, and treatment of acute complications. Demonstrates understanding / competency   Patient understands prevention, detection, and treatment of chronic complications. Needs Review   Patient understands how to develop strategies to address psychosocial issues. Demonstrates understanding / competency   Patient understands how to develop strategies to promote health/change behavior. Needs Review     Complications   Last HgB A1C per patient/outside source 7.7 %  04/11/16   How often do you check your blood sugar? 0 times/day (not testing)  was checking 2-3 times per week and now almost out of strips and can't afford them   Fasting Blood glucose range (mg/dL) 130-179   Postprandial Blood glucose range (mg/dL) >200   Number of hypoglycemic episodes per month 0   Number of hyperglycemic episodes per week 14   Can you tell when your  blood sugar is high? No   Have you had a dilated eye exam in the past 12 months? Yes   Have you had a dental exam in the past 12 months? No   Are you checking your feet? No     Dietary Intake   Breakfast none OR 2 eggs   Snack (morning) none   Lunch none OR just fruit   Snack (afternoon) none   Dinner lean meat or chicken, vegetables   Snack (evening) none   Beverage(s) water, sprite zero, crystal light      Exercise   Exercise Type ADL's   How many days per week to you exercise? 0   How many minutes per day do you exercise? 0   Total minutes per week of exercise 0     Patient Education   Previous Diabetes Education Yes (please comment)  When first diagnosed   Disease state  Explored patient's options for treatment of their diabetes   Nutrition management  Role of diet in the treatment of diabetes and the relationship between the three main macronutrients and blood glucose level;Other (comment)  Discussed foods high in potassium to avoid and importance of a low sodium diet with adequate but not excessive protein.   Physical activity and exercise  Other (comment)  did not addess at this time.   Medications Reviewed patients medication for diabetes, action, purpose, timing of dose and side effects.   Monitoring Purpose and frequency of SMBG.;Other (comment)  Discussed meter and strips approved by insurance.   Chronic complications Assessed and discussed foot care and prevention of foot problems;Identified and discussed with patient  current chronic complications;Retinopathy and reason for yearly dilated eye exams;Dental care   Psychosocial adjustment Role of stress on diabetes;Identified and addressed patients feelings and concerns about diabetes     Individualized Goals (developed by patient)   Nutrition General guidelines for healthy choices and portions discussed;Other (comment)  low potassium low sodium diet   Physical Activity Exercise 5-7 days per week;15 minutes per day   Medications take my medication as prescribed   Monitoring  test my blood glucose as discussed   Reducing Risk do foot checks daily   Health Coping Not Applicable     Post-Education Assessment   Patient understands the diabetes disease and treatment process. Demonstrates understanding / competency   Patient understands incorporating nutritional management into lifestyle. Needs Review   Patient undertands  incorporating physical activity into lifestyle. Needs Review   Patient understands using medications safely. Demonstrates understanding / competency   Patient understands monitoring blood glucose, interpreting and using results Demonstrates understanding / competency   Patient understands prevention, detection, and treatment of acute complications. Demonstrates understanding / competency   Patient understands prevention, detection, and treatment of chronic complications. Demonstrates understanding / competency   Patient understands how to develop strategies to address psychosocial issues. Demonstrates understanding / competency   Patient understands how to develop strategies to promote health/change behavior. Demonstrates understanding / competency     Outcomes   Expected Outcomes Demonstrated interest in learning. Expect positive outcomes   Future DMSE PRN   Program Status Completed      Individualized Plan for Diabetes Self-Management Training:   Learning Objective:  Patient will have a greater understanding of diabetes self-management. Patient education plan is to attend individual and/or group sessions per assessed needs and concerns.   Plan:   Patient Instructions  Avoid skipping meals. Choose a small amount of protein and carbohydrate each time you eat.  See the list for what foods to limit that are high potassium. Check your blood sugar as recommended.  Alternate the times that you check. Continue a low sodium diet. Read the label for any seasonings and avoid those that have sodium or potassium in the ingredient list.  Grocery shopping tips:  Choose fresh meat rather than frozen.  Choose fresh or frozen vegetables without added salt or canned vegetables that say no salt added.  Choose fresh fruit or fruit canned in it's own juice.  Pasta, noodles, white rice are fine.  1/2 cup is the serving size.  You may have 1-2 servings.  Choose white bread.  1 ounce low sodium cheese is  OK.  Breakfast ideas  White toast, egg, fruit, 1/2 cup lite yogurt (not greek)  White toast, low sodium cheese, 1 egg, fruit  White toast, low sodium cheese, fruit  White toast, low sodium [place] sausage, fruit    Lunch ideas:  Sandwich on white bread (homemade chicken or [place] salad, leftover roast or chicken, or cheese and 1 slice tomato) AND fruit  Homemade chicken and rice or chicken and noodle soup, small salad, 1 slice bread with butter and fruit  Leftovers  Beef stew made with onions and carrots over rice or noodles or with unsalted top crackers   Dinner ideas:  2 ounces lean meat, 1 cup pasta, 1/2 cup spaghetti sauce, 1/2 cup veges or fruit  2 ounce lean meat, 1/2 cup rice, 1/2 cup vege and fruit or more vegetable  Thin hamburger on a white bun with mayo with lettuce, fruit or vegetable    Expected Outcomes:  Demonstrated interest in learning. Expect positive outcomes  Education material provided: Choose a Meal booklet, Low Protein Recipes from the [employer], Nutrition Therapy for Kidney stones from AND  If problems or questions, patient to contact team via:  Phone and Email  Future DSME appointment: PRN

## 2016-06-07 NOTE — Telephone Encounter (Signed)
Pt called and asked for me to send in order for DM meter and supplies to [facility] order. She does not know what brand is covered. Called [employer] (could not find online) and was advised Accu-check or [employer]. I will send in Rxs.

## 2016-06-19 ENCOUNTER — Ambulatory Visit: Payer: Medicare Other | Admitting: Pain Medicine

## 2016-06-21 ENCOUNTER — Encounter: Payer: Self-pay | Admitting: Physician Assistant

## 2016-06-21 ENCOUNTER — Ambulatory Visit (INDEPENDENT_AMBULATORY_CARE_PROVIDER_SITE_OTHER): Payer: Medicare Other | Admitting: Physician Assistant

## 2016-06-21 VITALS — BP 100/68 | HR 98 | Temp 98.8°F | Resp 18 | Ht <= 58 in | Wt 146.0 lb

## 2016-06-21 DIAGNOSIS — N390 Urinary tract infection, site not specified: Secondary | ICD-10-CM

## 2016-06-21 DIAGNOSIS — R509 Fever, unspecified: Secondary | ICD-10-CM

## 2016-06-21 LAB — COMPLETE METABOLIC PANEL WITHOUT GFR
ALT: 8 U/L (ref 6–29)
AST: 12 U/L (ref 10–35)
BUN: 27 mg/dL — ABNORMAL HIGH (ref 7–25)
CO2: 22 mmol/L (ref 20–31)
Calcium: 9.5 mg/dL (ref 8.6–10.4)
Chloride: 102 mmol/L (ref 98–110)
Creat: 1.46 mg/dL — ABNORMAL HIGH (ref 0.50–0.99)
Glucose, Bld: 156 mg/dL — ABNORMAL HIGH (ref 65–99)

## 2016-06-21 LAB — POCT URINALYSIS DIP (MANUAL ENTRY)
Bilirubin, UA: NEGATIVE
Glucose, UA: NEGATIVE
Ketones, POC UA: NEGATIVE
Nitrite, UA: NEGATIVE
Protein Ur, POC: 100 — AB
Spec Grav, UA: 1.015
Urobilinogen, UA: 0.2
pH, UA: 5

## 2016-06-21 LAB — POC MICROSCOPIC URINALYSIS (UMFC): Mucus: ABSENT

## 2016-06-21 LAB — COMPLETE METABOLIC PANEL WITH GFR
Albumin: 4 g/dL (ref 3.6–5.1)
Alkaline Phosphatase: 60 U/L (ref 33–130)
GFR, Est African American: 43 mL/min — ABNORMAL LOW (ref >=60)
GFR, Est Non African American: 37 mL/min — ABNORMAL LOW (ref >=60)
Potassium: 5 mmol/L (ref 3.5–5.3)
Sodium: 136 mmol/L (ref 135–146)
Total Bilirubin: 0.2 mg/dL (ref 0.2–1.2)
Total Protein: 6.6 g/dL (ref 6.1–8.1)

## 2016-06-21 MED ORDER — CEFTRIAXONE SODIUM 1 G IJ SOLR
1.0000 g | Freq: Once | INTRAMUSCULAR | Status: AC
  Administered 2016-06-21: 1 g via INTRAMUSCULAR

## 2016-06-21 MED ORDER — CEPHALEXIN 500 MG PO CAPS: 500.0000 mg | ORAL_CAPSULE | Freq: Three times a day (TID) | ORAL | 0 refills | Status: DC

## 2016-06-21 NOTE — Patient Instructions (Signed)
     IF you received an x-ray today, you will receive an invoice from [facility]. Please contact [facility] at [phone] with questions or concerns regarding your invoice.   IF you received labwork today, you will receive an invoice from [facility]. Please contact [facility] at [phone] with questions or concerns regarding your invoice.   Our billing staff will not be able to assist you with questions regarding bills from these companies.  You will be contacted with the lab results as soon as they are available. The fastest way to get your results is to activate your My Chart account. Instructions are located on the last page of this paperwork. If you have not heard from us regarding the results in 2 weeks, please contact this office.      

## 2016-06-21 NOTE — Progress Notes (Signed)
Natalie [name]  MRN: [id number] DOB: October 19, 1950  PCP: Wendie Agreste, MD  Subjective:  Pt is a 65 year old female with a history of hypertension, diabetes, recurrent UTI's, Pyelonephritis and renal stone, who presents to clinic for fever x one day. She developed a fever and chills last night with low back pain and is concerned this is associated with a kidney infection. She has had UTI's in the past and states this feels the same.   Seen in ED 03/2016 altered mental status and weakness. Treated for a UTI with Cipro.   Stint in right ureter placed last month, due to kidney stone. Follow-up appointment with nephrologist in October.   Review of Systems  Constitutional: Positive for chills and fever.  Cardiovascular: Negative.   Gastrointestinal: Negative for nausea and vomiting.  Genitourinary: Negative for decreased urine volume, difficulty urinating, dysuria, frequency, hematuria and urgency.  Musculoskeletal: Positive for back pain (lower back ).    Patient Active Problem List   Diagnosis Date Noted  . Renal stone 05/14/2016  . Sepsis ([place]) 04/10/2016  . Acute kidney injury ([place]) 04/10/2016  . Type 2 diabetes mellitus with other diabetic kidney complication ([place]) 47/06/6282  . Migraine, chronic, without aura, intractable, with status migrainosus 01/04/2016  . Abdominal pain   . AKI (acute kidney injury) ([place])   . Pyelonephritis 08/31/2014  . 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 BMI 33.0-33.9,adult 03/30/2013  . Recurrent UTI 09/17/2012  . DM (diabetes mellitus) ([place]) 03/19/2012  . HTN (hypertension) 03/19/2012  . Hyperlipemia 03/19/2012  . Hypothyroidism 03/19/2012  . Seizure disorder ([place]) 03/19/2012  . Insomnia 03/19/2012  . Migraine 03/19/2012    Current Outpatient Prescriptions on File Prior to Visit  Medication Sig Dispense Refill  . amLODipine (NORVASC) 5 MG tablet Take 1 tablet (5 mg total) by mouth daily. 90  tablet 1  . atorvastatin (LIPITOR) 20 MG tablet Take 1 tablet (20 mg total) by mouth daily. 90 tablet 3  . butorphanol (STADOL) 10 MG/ML nasal spray Place 1 spray into the nose every 4 (four) hours as needed for migraine. May refill weekly. 2.5 mL 5  . ESTRACE VAGINAL 0.1 MG/GM vaginal cream insert 1/4 gram vaginally two times a week  0  . glipiZIDE (GLUCOTROL) 5 MG tablet Take 1 tablet (5 mg total) by mouth 2 (two) times daily before a meal. 180 tablet 0  . lactobacillus acidophilus (BACID) TABS tablet Take 2 tablets by mouth 3 (three) times daily.    Marland Kitchen levothyroxine (SYNTHROID, LEVOTHROID) 75 MCG tablet Take 1 tablet (75 mcg total) by mouth daily. 90 tablet 3  . ondansetron (ZOFRAN-ODT) 8 MG disintegrating tablet DISSOLVE 1 TABLET ON OR UNDER THE TONGUE EVERY 8 HOURS AS NEEDED FOR NAUSEA 20 tablet 5  . PHENobarbital (LUMINAL) 97.2 MG tablet Take 1-2 tablets (97.2-194.4 mg total) by mouth daily. TAKE TWO TABLETS BY MOUTH ONCE DAILY ON  MON,  WED,  AND  FRI  AND  1  TABLET  DAILY  ON  OTHER  DAYS 135 tablet 1  . polycarbophil (FIBERCON) 625 MG tablet Take 625 mg by mouth daily.    Marland Kitchen terconazole (TERAZOL 3) 0.8 % vaginal cream Place 1 applicator vaginally at bedtime.   0  . zolpidem (AMBIEN) 5 MG tablet take 1 tablet by mouth at bedtime for sleep if needed. May repeat x 1 in 1 hr if needed 60 tablet 5   No current facility-administered medications on file prior to visit.  Allergies  Allergen Reactions  . Compazine     Bad headache, vomiting   . Cymbalta [Duloxetine Hcl]     unknown  . Escitalopram Oxalate Other (See Comments)    Can't sleep  . Methadone Hives  . Nitrofuran Derivatives     unknown    Objective:  BP 100/68 (BP Location: Right Arm, Patient Position: Sitting, Cuff Size: Small)   Pulse 98   Temp 98.8 F (37.1 C) (Oral)   Resp 18   Ht 4\' 8"  (1.422 m)   Wt 146 lb (66.2 kg)   SpO2 99%   BMI 32.73 kg/m   Physical Exam  Constitutional: She is oriented to person,  place, and time and well-developed, well-nourished, and in no distress. No distress.  Cardiovascular: Normal rate, regular rhythm and normal heart sounds.   Abdominal: Soft. She exhibits no distension. There is no CVA tenderness.  Neurological: She is alert and oriented to person, place, and time. GCS score is 15.  Skin: Skin is warm and dry.  Psychiatric: Mood, memory, affect and judgment normal.  Vitals reviewed.   Results for orders placed or performed in visit on 06/21/16  POCT urinalysis dipstick  Result Value Ref Range   Color, UA yellow yellow   Clarity, UA cloudy (A) clear   Glucose, UA negative negative   Bilirubin, UA negative negative   Ketones, POC UA negative negative   Spec Grav, UA 1.015    Blood, UA large (A) negative   pH, UA 5.0    Protein Ur, POC =100 (A) negative   Urobilinogen, UA 0.2    Nitrite, UA Negative Negative   Leukocytes, UA large (3+) (A) Negative  POCT Microscopic Urinalysis ([facility])  Result Value Ref Range   WBC,UR,HPF,POC Too numerous to count  (A) None WBC/hpf   RBC,UR,HPF,POC Too numerous to count  (A) None RBC/hpf   Bacteria Many (A) None, Too numerous to count   Mucus Absent Absent   Epithelial Cells, UR Per Microscopy Few (A) None, Too numerous to count cells/hpf    Assessment and Plan :  1. Fever, unspecified fever cause 2. Urinary tract infection, site not specified - Encouraged patient to stay well hydrated. RTC if symptoms do not improve/worsen.  - POCT urinalysis dipstick - POCT Microscopic Urinalysis ([facility]) - Urine culture - COMPLETE METABOLIC PANEL WITH GFR - cefTRIAXone (ROCEPHIN) injection 1 g; Inject 1 g into the muscle once.   Mercer Pod, PA-C  [facility] 06/21/2016 11:53 AM

## 2016-06-22 ENCOUNTER — Emergency Department (HOSPITAL_COMMUNITY): Payer: Medicare Other

## 2016-06-22 ENCOUNTER — Inpatient Hospital Stay (HOSPITAL_COMMUNITY)
Admission: EM | Admit: 2016-06-22 | Discharge: 2016-06-25 | DRG: 690 | Disposition: A | Discharge status: Home / Self Care | Payer: Medicare Other | Attending: Internal Medicine | Admitting: Internal Medicine

## 2016-06-22 ENCOUNTER — Ambulatory Visit (INDEPENDENT_AMBULATORY_CARE_PROVIDER_SITE_OTHER): Payer: Medicare Other | Admitting: Family Medicine

## 2016-06-22 ENCOUNTER — Telehealth: Payer: Self-pay

## 2016-06-22 ENCOUNTER — Encounter (HOSPITAL_COMMUNITY): Payer: Self-pay | Admitting: Emergency Medicine

## 2016-06-22 VITALS — BP 148/70 | HR 116 | Temp 103.0°F | Resp 20

## 2016-06-22 DIAGNOSIS — A419 Sepsis, unspecified organism: Secondary | ICD-10-CM

## 2016-06-22 DIAGNOSIS — D518 Other vitamin B12 deficiency anemias: Secondary | ICD-10-CM

## 2016-06-22 DIAGNOSIS — R6510 Systemic inflammatory response syndrome (SIRS) of non-infectious origin without acute organ dysfunction: Secondary | ICD-10-CM | POA: Diagnosis not present

## 2016-06-22 DIAGNOSIS — I129 Hypertensive chronic kidney disease with stage 1 through stage 4 chronic kidney disease, or unspecified chronic kidney disease: Secondary | ICD-10-CM | POA: Diagnosis present

## 2016-06-22 DIAGNOSIS — N200 Calculus of kidney: Secondary | ICD-10-CM | POA: Diagnosis present

## 2016-06-22 DIAGNOSIS — D509 Iron deficiency anemia, unspecified: Secondary | ICD-10-CM | POA: Diagnosis present

## 2016-06-22 DIAGNOSIS — N12 Tubulo-interstitial nephritis, not specified as acute or chronic: Secondary | ICD-10-CM | POA: Diagnosis not present

## 2016-06-22 DIAGNOSIS — E1129 Type 2 diabetes mellitus with other diabetic kidney complication: Secondary | ICD-10-CM

## 2016-06-22 DIAGNOSIS — D519 Vitamin B12 deficiency anemia, unspecified: Secondary | ICD-10-CM | POA: Diagnosis present

## 2016-06-22 DIAGNOSIS — I10 Essential (primary) hypertension: Secondary | ICD-10-CM | POA: Diagnosis not present

## 2016-06-22 DIAGNOSIS — Z8249 Family history of ischemic heart disease and other diseases of the circulatory system: Secondary | ICD-10-CM | POA: Diagnosis not present

## 2016-06-22 DIAGNOSIS — Z881 Allergy status to other antibiotic agents status: Secondary | ICD-10-CM

## 2016-06-22 DIAGNOSIS — Z9049 Acquired absence of other specified parts of digestive tract: Secondary | ICD-10-CM | POA: Diagnosis not present

## 2016-06-22 DIAGNOSIS — Z885 Allergy status to narcotic agent status: Secondary | ICD-10-CM

## 2016-06-22 DIAGNOSIS — Z87442 Personal history of urinary calculi: Secondary | ICD-10-CM

## 2016-06-22 DIAGNOSIS — N183 Chronic kidney disease, stage 3 (moderate): Secondary | ICD-10-CM | POA: Diagnosis present

## 2016-06-22 DIAGNOSIS — Z7984 Long term (current) use of oral hypoglycemic drugs: Secondary | ICD-10-CM

## 2016-06-22 DIAGNOSIS — E785 Hyperlipidemia, unspecified: Secondary | ICD-10-CM | POA: Diagnosis present

## 2016-06-22 DIAGNOSIS — N10 Acute tubulo-interstitial nephritis: Secondary | ICD-10-CM

## 2016-06-22 DIAGNOSIS — N261 Atrophy of kidney (terminal): Secondary | ICD-10-CM | POA: Diagnosis not present

## 2016-06-22 DIAGNOSIS — G40909 Epilepsy, unspecified, not intractable, without status epilepticus: Secondary | ICD-10-CM | POA: Diagnosis not present

## 2016-06-22 DIAGNOSIS — Z87440 Personal history of urinary (tract) infections: Secondary | ICD-10-CM | POA: Diagnosis not present

## 2016-06-22 DIAGNOSIS — N179 Acute kidney failure, unspecified: Secondary | ICD-10-CM | POA: Diagnosis not present

## 2016-06-22 DIAGNOSIS — E872 Acidosis: Secondary | ICD-10-CM | POA: Diagnosis present

## 2016-06-22 DIAGNOSIS — Z833 Family history of diabetes mellitus: Secondary | ICD-10-CM

## 2016-06-22 DIAGNOSIS — Z79899 Other long term (current) drug therapy: Secondary | ICD-10-CM

## 2016-06-22 DIAGNOSIS — N136 Pyonephrosis: Principal | ICD-10-CM | POA: Diagnosis present

## 2016-06-22 DIAGNOSIS — Z888 Allergy status to other drugs, medicaments and biological substances status: Secondary | ICD-10-CM | POA: Diagnosis not present

## 2016-06-22 DIAGNOSIS — R509 Fever, unspecified: Secondary | ICD-10-CM | POA: Diagnosis not present

## 2016-06-22 DIAGNOSIS — N132 Hydronephrosis with renal and ureteral calculous obstruction: Secondary | ICD-10-CM | POA: Diagnosis not present

## 2016-06-22 DIAGNOSIS — R109 Unspecified abdominal pain: Secondary | ICD-10-CM | POA: Diagnosis present

## 2016-06-22 DIAGNOSIS — N390 Urinary tract infection, site not specified: Secondary | ICD-10-CM

## 2016-06-22 DIAGNOSIS — E1122 Type 2 diabetes mellitus with diabetic chronic kidney disease: Secondary | ICD-10-CM | POA: Diagnosis present

## 2016-06-22 LAB — URINALYSIS, ROUTINE W REFLEX MICROSCOPIC
Bilirubin Urine: NEGATIVE
Glucose, UA: NEGATIVE mg/dL
Ketones, ur: NEGATIVE mg/dL
Nitrite: NEGATIVE
Protein, ur: 30 mg/dL — AB
Specific Gravity, Urine: 1.006 (ref 1.005–1.030)
pH: 6 (ref 5.0–8.0)

## 2016-06-22 LAB — CBC WITH DIFFERENTIAL/PLATELET
Basophils Absolute: 0 10*3/uL (ref 0.0–0.1)
Basophils Relative: 0 %
Eosinophils Absolute: 0 10*3/uL (ref 0.0–0.7)
Eosinophils Relative: 0 %
HCT: 27.1 % — ABNORMAL LOW (ref 36.0–46.0)
Hemoglobin: 8.5 g/dL — ABNORMAL LOW (ref 12.0–15.0)
Lymphocytes Relative: 9 %
Lymphs Abs: 1.1 10*3/uL (ref 0.7–4.0)
MCH: 26.6 pg (ref 26.0–34.0)
MCHC: 31.4 g/dL (ref 30.0–36.0)
MCV: 85 fL (ref 78.0–100.0)
Monocytes Absolute: 2.3 10*3/uL — ABNORMAL HIGH (ref 0.1–1.0)
Monocytes Relative: 18 %
Neutro Abs: 9.1 10*3/uL — ABNORMAL HIGH (ref 1.7–7.7)
Neutrophils Relative %: 73 %
Platelets: 303 10*3/uL (ref 150–400)
RBC: 3.19 MIL/uL — ABNORMAL LOW (ref 3.87–5.11)
RDW: 14.3 % (ref 11.5–15.5)
WBC: 12.5 10*3/uL — ABNORMAL HIGH (ref 4.0–10.5)

## 2016-06-22 LAB — POCT CBC
GRANULOCYTE PERCENT: 71.3 % (ref 37–80)
HEMATOCRIT: 28.4 % — AB (ref 37.7–47.9)
Hemoglobin: 9.2 g/dL — AB (ref 12.2–16.2)
Lymph, poc: 3.6 — AB (ref 0.6–3.4)
MCH, POC: 26.7 pg — AB (ref 27–31.2)
MCHC: 32.2 g/dL (ref 31.8–35.4)
MCV: 82.8 fL (ref 80–97)
MID (CBC): 1.3 — AB (ref 0–0.9)
MPV: 7.3 fL (ref 0–99.8)
POC GRANULOCYTE: 12 — AB (ref 2–6.9)
POC LYMPH %: 21.2 % (ref 10–50)
POC MID %: 7.5 % (ref 0–12)
Platelet Count, POC: 234 10*3/uL (ref 142–424)
RBC: 3.43 M/uL — AB (ref 4.04–5.48)
RDW, POC: 16.4 %
WBC: 16.9 10*3/uL — AB (ref 4.6–10.2)

## 2016-06-22 LAB — COMPREHENSIVE METABOLIC PANEL
ALT: 13 U/L — ABNORMAL LOW (ref 14–54)
AST: 15 U/L (ref 15–41)
Albumin: 3.8 g/dL (ref 3.5–5.0)
Alkaline Phosphatase: 50 U/L (ref 38–126)
Anion gap: 11 (ref 5–15)
BUN: 23 mg/dL — ABNORMAL HIGH (ref 6–20)
CO2: 19 mmol/L — ABNORMAL LOW (ref 22–32)
Calcium: 9.1 mg/dL (ref 8.9–10.3)
Chloride: 104 mmol/L (ref 101–111)
Creatinine, Ser: 1.47 mg/dL — ABNORMAL HIGH (ref 0.44–1.00)
GFR calc Af Amer: 41 mL/min — ABNORMAL LOW (ref >60)
GFR calc non Af Amer: 36 mL/min — ABNORMAL LOW (ref >60)
Glucose, Bld: 149 mg/dL — ABNORMAL HIGH (ref 65–99)
Potassium: 3.7 mmol/L (ref 3.5–5.1)
Sodium: 134 mmol/L — ABNORMAL LOW (ref 135–145)
Total Bilirubin: 0.5 mg/dL (ref 0.3–1.2)
Total Protein: 7.4 g/dL (ref 6.5–8.1)

## 2016-06-22 LAB — I-STAT CG4 LACTIC ACID, ED: Lactic Acid, Venous: 0.78 mmol/L (ref 0.5–1.9)

## 2016-06-22 LAB — URINE MICROSCOPIC-ADD ON

## 2016-06-22 LAB — GLUCOSE, POCT (MANUAL RESULT ENTRY): POC Glucose: 168 mg/dl — AB (ref 70–99)

## 2016-06-22 LAB — GLUCOSE, CAPILLARY: Glucose-Capillary: 180 mg/dL — ABNORMAL HIGH (ref 65–99)

## 2016-06-22 MED ORDER — SODIUM CHLORIDE 0.9% FLUSH
3.0000 mL | Freq: Two times a day (BID) | INTRAVENOUS | Status: DC
  Administered 2016-06-23 – 2016-06-25 (×5): 3 mL via INTRAVENOUS

## 2016-06-22 MED ORDER — INSULIN ASPART 100 UNIT/ML ~~LOC~~ SOLN: 0.0000 [IU] | Freq: Every day | SUBCUTANEOUS | Status: DC

## 2016-06-22 MED ORDER — VANCOMYCIN HCL 500 MG IV SOLR: 500.0000 mg | INTRAVENOUS | Status: DC

## 2016-06-22 MED ORDER — PHENOBARBITAL 97.2 MG PO TABS
97.2000 mg | ORAL_TABLET | ORAL | Status: DC
  Administered 2016-06-23 – 2016-06-24 (×2): 97.2 mg via ORAL
  Filled 2016-06-22: qty 1

## 2016-06-22 MED ORDER — INSULIN ASPART 100 UNIT/ML ~~LOC~~ SOLN
0.0000 [IU] | Freq: Three times a day (TID) | SUBCUTANEOUS | Status: DC
  Administered 2016-06-23: 2 [IU] via SUBCUTANEOUS
  Administered 2016-06-23: 1 [IU] via SUBCUTANEOUS
  Administered 2016-06-24 – 2016-06-25 (×5): 2 [IU] via SUBCUTANEOUS

## 2016-06-22 MED ORDER — ACETAMINOPHEN 500 MG PO TABS: 1000.0000 mg | ORAL_TABLET | Freq: Once | ORAL | Status: DC

## 2016-06-22 MED ORDER — ONDANSETRON HCL 4 MG PO TABS: 4.0000 mg | ORAL_TABLET | Freq: Four times a day (QID) | ORAL | Status: DC | PRN

## 2016-06-22 MED ORDER — SODIUM CHLORIDE 0.9 % IV SOLN
INTRAVENOUS | Status: AC
  Administered 2016-06-23: via INTRAVENOUS

## 2016-06-22 MED ORDER — SODIUM CHLORIDE 0.9 % IV BOLUS (SEPSIS)
1000.0000 mL | Freq: Once | INTRAVENOUS | Status: AC
  Administered 2016-06-22: 1000 mL via INTRAVENOUS

## 2016-06-22 MED ORDER — ZOLPIDEM TARTRATE 5 MG PO TABS
5.0000 mg | ORAL_TABLET | Freq: Every evening | ORAL | Status: DC | PRN
Start: 2016-06-22 — End: 2016-06-25

## 2016-06-22 MED ORDER — MORPHINE SULFATE (PF) 4 MG/ML IV SOLN
4.0000 mg | Freq: Once | INTRAVENOUS | Status: AC
Start: 2016-06-22 — End: 2016-06-22
  Administered 2016-06-22: 4 mg via INTRAVENOUS
  Filled 2016-06-22: qty 1

## 2016-06-22 MED ORDER — ACETAMINOPHEN 325 MG PO TABS
650.0000 mg | ORAL_TABLET | Freq: Four times a day (QID) | ORAL | Status: DC | PRN
  Administered 2016-06-23: 650 mg via ORAL
  Filled 2016-06-22: qty 2

## 2016-06-22 MED ORDER — ONDANSETRON HCL 4 MG/2ML IJ SOLN
4.0000 mg | Freq: Four times a day (QID) | INTRAMUSCULAR | Status: DC | PRN
  Administered 2016-06-23: 4 mg via INTRAVENOUS
  Filled 2016-06-22: qty 2

## 2016-06-22 MED ORDER — ACETAMINOPHEN 325 MG PO TABS
650.0000 mg | ORAL_TABLET | Freq: Once | ORAL | Status: AC
  Administered 2016-06-22: 650 mg via ORAL
  Filled 2016-06-22: qty 2

## 2016-06-22 MED ORDER — ENOXAPARIN SODIUM 30 MG/0.3ML ~~LOC~~ SOLN
30.0000 mg | Freq: Every day | SUBCUTANEOUS | Status: DC
  Administered 2016-06-23: 30 mg via SUBCUTANEOUS
  Filled 2016-06-22: qty 0.3

## 2016-06-22 MED ORDER — PHENOBARBITAL 97.2 MG PO TABS
194.4000 mg | ORAL_TABLET | ORAL | Status: DC
  Administered 2016-06-23 – 2016-06-25 (×2): 194.4 mg via ORAL
  Filled 2016-06-22 (×4): qty 2

## 2016-06-22 MED ORDER — DEXTROSE 5 % IV SOLN
2.0000 g | Freq: Once | INTRAVENOUS | Status: AC
  Administered 2016-06-22: 2 g via INTRAVENOUS
  Filled 2016-06-22: qty 2

## 2016-06-22 MED ORDER — BUTORPHANOL TARTRATE 10 MG/ML NA SOLN
1.0000 | NASAL | Status: DC | PRN
  Administered 2016-06-23 – 2016-06-25 (×7): 1 via NASAL
  Filled 2016-06-22: qty 2.5

## 2016-06-22 MED ORDER — ATORVASTATIN CALCIUM 20 MG PO TABS
20.0000 mg | ORAL_TABLET | Freq: Every day | ORAL | Status: DC
  Administered 2016-06-23 – 2016-06-25 (×3): 20 mg via ORAL
  Filled 2016-06-22 (×3): qty 1

## 2016-06-22 MED ORDER — LEVOTHYROXINE SODIUM 75 MCG PO TABS
75.0000 ug | ORAL_TABLET | Freq: Every day | ORAL | Status: DC
  Administered 2016-06-23 – 2016-06-25 (×3): 75 ug via ORAL
  Filled 2016-06-22 (×3): qty 1

## 2016-06-22 MED ORDER — SODIUM CHLORIDE 0.9 % IV BOLUS (SEPSIS)
250.0000 mL | Freq: Once | INTRAVENOUS | Status: AC
  Administered 2016-06-22: 250 mL via INTRAVENOUS

## 2016-06-22 MED ORDER — ACETAMINOPHEN 650 MG RE SUPP: 650.0000 mg | Freq: Four times a day (QID) | RECTAL | Status: DC | PRN

## 2016-06-22 MED ORDER — MORPHINE SULFATE (PF) 4 MG/ML IV SOLN
4.0000 mg | Freq: Once | INTRAVENOUS | Status: AC
  Administered 2016-06-22: 4 mg via INTRAVENOUS
  Filled 2016-06-22: qty 1

## 2016-06-22 MED ORDER — VANCOMYCIN HCL IN DEXTROSE 1-5 GM/200ML-% IV SOLN
1000.0000 mg | Freq: Once | INTRAVENOUS | Status: AC
  Administered 2016-06-22: 1000 mg via INTRAVENOUS
  Filled 2016-06-22: qty 200

## 2016-06-22 MED ORDER — HYDROCODONE-ACETAMINOPHEN 5-325 MG PO TABS
1.0000 | ORAL_TABLET | ORAL | Status: DC | PRN
  Filled 2016-06-22: qty 2

## 2016-06-22 MED ORDER — DEXTROSE 5 % IV SOLN: 1.0000 g | INTRAVENOUS | Status: DC

## 2016-06-22 NOTE — ED Notes (Signed)
Acetaminophen 1000mg  given before arrival.

## 2016-06-22 NOTE — ED Triage Notes (Signed)
Per EMS pt sent from primary care doctor related to worsening pain, labs, and fever with significant kidney hx. Pt given tylenol PTA.

## 2016-06-22 NOTE — Progress Notes (Signed)
By signing my name below, I, [name], attest that this documentation has been prepared under the direction and in the presence of [name], MD.  Electronically Signed: [name], Medical Scribe. 06/22/16. 1:59 PM.  Subjective:    Patient ID: Natalie [name], female    DOB: 02-04-1951, 66 y.o.   MRN: [id number]  HPI Chief Complaint  Patient presents with  . Urinary Tract Infection    follow up uti/pain is worse in low back    HPI Comments: Natalie [name] is a 65 y.o. female who presents to the [facility] for follow-up from 06/21/16. Pt has a PMHx of multiple medical problems including DM2, , migraine HAs, acute kidney injury after pyelonephritis and sepsis in July also with nephrolithotomy, Rt renal pelvis stone and severe rt hydronephrosis when admitted in July. Urologist is Dr. Diona Fanti. She was most recently admitted Aug 15th by urologist for PCNL for large right renal stone burden. She was seen yesterday with fever for 1 day, chills and low back pain- nl temperature, large leukocytes on urine analysis, TNTC WBCs and RBC on urinalysis. She was given a Rocephin 1g injection, and CMP ordered. Renal function improved compared to prev readings but still elevated at 1.46. Urine culture still in process.   Patient feels worse since yesterday. Pt had chills, fever of 102.8 last night, right lower back pain, urinary frequency, and weakness. Pt states walking to her exam room exhausted her and she's been in bed all day. Pt has been drinking cranberry juice and a little bit of water today. Pt hasn't checked her blood sugar today because she was too tired. Pt is compliant with her DM medications- takes glipizide BID. Pt no longer uses insulin. Pt still has a renal stent on the rt side from her renal surgery in Aug. Pt goes back Oct 11th for follow-up. Pt denies seeing blood in her urine, diaphoresis, light-headedness, cough, nausea, and vomiting.  Patient Active Problem  List   Diagnosis Date Noted  . Renal stone 05/14/2016  . Sepsis ([place]) 04/10/2016  . Acute kidney injury ([place]) 04/10/2016  . Type 2 diabetes mellitus with other diabetic kidney complication ([place]) 40/98/1191  . Migraine, chronic, without aura, intractable, with status migrainosus 01/04/2016  . Abdominal pain   . AKI (acute kidney injury) ([place])   . Pyelonephritis 08/31/2014  . 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 BMI 33.0-33.9,adult 03/30/2013  . Recurrent UTI 09/17/2012  . DM (diabetes mellitus) ([place]) 03/19/2012  . HTN (hypertension) 03/19/2012  . Hyperlipemia 03/19/2012  . Hypothyroidism 03/19/2012  . Seizure disorder ([place]) 03/19/2012  . Insomnia 03/19/2012  . Migraine 03/19/2012   Past Medical History:  Diagnosis Date  . Chronic kidney disease    kidney stones, right and left  . Diabetes mellitus without complication ([place])   . Headache    migraines  . Hyperlipidemia   . Hypertension   . Seizures ([place])    none x 30 yrs   Past Surgical History:  Procedure Laterality Date  . CESAREAN SECTION    . CHOLECYSTECTOMY    . KIDNEY STONE SURGERY    . NEPHROLITHOTOMY Right 05/14/2016   Procedure: NEPHROLITHOTOMY PERCUTANEOUS;  Surgeon: Franchot Gallo, MD;  Location: [facility];  Service: Urology;  Laterality: Right;  . SHOULDER ARTHROSCOPY Bilateral    Allergies  Allergen Reactions  . Compazine     Bad headache, vomiting   . Cymbalta [Duloxetine Hcl]     unknown  .  Escitalopram Oxalate Other (See Comments)    Can't sleep  . Methadone Hives  . Nitrofuran Derivatives     unknown   Prior to Admission medications   Medication Sig Start Date End Date Taking? Authorizing Provider  amLODipine (NORVASC) 5 MG tablet Take 1 tablet (5 mg total) by mouth daily. 05/31/16  Yes Shawnee Knapp, MD  atorvastatin (LIPITOR) 20 MG tablet Take 1 tablet (20 mg total) by mouth daily. 01/04/16  Yes Leandrew Koyanagi, MD  butorphanol (STADOL) 10 MG/ML  nasal spray Place 1 spray into the nose every 4 (four) hours as needed for migraine. May refill weekly. 05/31/16  Yes Shawnee Knapp, MD  ESTRACE VAGINAL 0.1 MG/GM vaginal cream insert 1/4 gram vaginally two times a week 12/03/15  Yes Historical Provider, MD  glipiZIDE (GLUCOTROL) 5 MG tablet Take 1 tablet (5 mg total) by mouth 2 (two) times daily before a meal. 05/31/16  Yes Shawnee Knapp, MD  lactobacillus acidophilus (BACID) TABS tablet Take 2 tablets by mouth 3 (three) times daily.   Yes Historical Provider, MD  levothyroxine (SYNTHROID, LEVOTHROID) 75 MCG tablet Take 1 tablet (75 mcg total) by mouth daily. 01/04/16  Yes Leandrew Koyanagi, MD  ondansetron (ZOFRAN-ODT) 8 MG disintegrating tablet DISSOLVE 1 TABLET ON OR UNDER THE TONGUE EVERY 8 HOURS AS NEEDED FOR NAUSEA 05/31/16  Yes Shawnee Knapp, MD  PHENobarbital (LUMINAL) 97.2 MG tablet Take 1-2 tablets (97.2-194.4 mg total) by mouth daily. TAKE TWO TABLETS BY MOUTH ONCE DAILY ON  MON,  WED,  AND  FRI  AND  1  TABLET  DAILY  ON  OTHER  DAYS 05/31/16  Yes Shawnee Knapp, MD  polycarbophil (FIBERCON) 625 MG tablet Take 625 mg by mouth daily.   Yes Historical Provider, MD  terconazole (TERAZOL 3) 0.8 % vaginal cream Place 1 applicator vaginally at bedtime.  12/05/15  Yes Historical Provider, MD  zolpidem (AMBIEN) 5 MG tablet take 1 tablet by mouth at bedtime for sleep if needed. May repeat x 1 in 1 hr if needed 05/31/16  Yes Shawnee Knapp, MD   Social History   Social History  . Marital status: Married    Spouse name: N/A  . Number of children: N/A  . Years of education: N/A   Occupational History  . Not on file.   Social History Main Topics  . Smoking status: Never Smoker  . Smokeless tobacco: Never Used  . Alcohol use No  . Drug use: No  . Sexual activity: Not on file   Other Topics Concern  . Not on file   Social History Narrative  . No narrative on file   Depression screen [facility] 2/9 06/21/2016 06/07/2016 05/31/2016 05/11/2016 05/08/2016  Decreased Interest 0  0 0 0 0  Down, Depressed, Hopeless 0 0 1 1 0  PHQ - 2 Score 0 0 1 1 0   Review of Systems  Constitutional: Positive for chills and fever. Negative for diaphoresis.  Respiratory: Negative for cough.   Gastrointestinal: Negative for nausea and vomiting.  Genitourinary: Positive for frequency. Negative for hematuria.  Skin: Negative for rash.  Neurological: Positive for weakness. Negative for light-headedness.   Objective:  Physical Exam  Constitutional: She appears well-developed and well-nourished. No distress.  HENT:  Head: Normocephalic and atraumatic.  Eyes: Conjunctivae are normal.  Neck: Neck supple.  Cardiovascular: Tachycardia present.   Pulmonary/Chest: Effort normal and breath sounds normal. No respiratory distress. She has no wheezes. She has no  rales.  Abdominal: There is CVA tenderness (rt side).  No focal abdominal tenderness  Neurological: She is alert.  Skin: Skin is warm and dry. There is pallor.  She appears pale  Psychiatric: She has a normal mood and affect. Her behavior is normal.  Nursing note and vitals reviewed.  BP (!) 148/70 (BP Location: Right Arm, Cuff Size: Normal)   Pulse (!) 116   Temp (!) 103 F (39.4 C)   Resp 20   SpO2 98%    Results for orders placed or performed in visit on 06/22/16  POCT CBC  Result Value Ref Range   WBC 16.9 (A) 4.6 - 10.2 K/uL   Lymph, poc 3.6 (A) 0.6 - 3.4   POC LYMPH PERCENT 21.2 10 - 50 %L   MID (cbc) 1.3 (A) 0 - 0.9   POC MID % 7.5 0 - 12 %M   POC Granulocyte 12.0 (A) 2 - 6.9   Granulocyte percent 71.3 37 - 80 %G   RBC 3.43 (A) 4.04 - 5.48 M/uL   Hemoglobin 9.2 (A) 12.2 - 16.2 g/dL   HCT, POC 28.4 (A) 37.7 - 47.9 %   MCV 82.8 80 - 97 fL   MCH, POC 26.7 (A) 27 - 31.2 pg   MCHC 32.2 31.8 - 35.4 g/dL   RDW, POC 16.4 %   Platelet Count, POC 234 142 - 424 K/uL   MPV 7.3 0 - 99.8 fL  POCT glucose (manual entry)  Result Value Ref Range   POC Glucose 168 (A) 70 - 99 mg/dl     [2:20 PM] IV was placed running  nl saline, was placed in the rt AC. Call for EMS transport.  2:37 PM. Report given to EMS with transfer care, charge nurse at [facility] advised  Assessment & Plan:   Natalie [name] is a 65 y.o. female Type 2 diabetes mellitus with other diabetic kidney complication ([place]) - Plan: POCT glucose (manual entry)  Fever, unspecified - Plan: POCT CBC, acetaminophen (TYLENOL) tablet 1,000 mg  Acute pyelonephritis  Nephrolithiasis  History of multiple medical problems as above, admitted in July with sepsis, severe hydronephrosis with nephrolith, and acute kidney injury. Now with suspected repeat pyelonephritis and concerning for bacteremia/SIRS, with more severe infection. History of nephrolith and renal stent. IV placed for transport by EMS for admission to hospital, and further evaluation.   Meds ordered this encounter  Medications  . acetaminophen (TYLENOL) tablet 1,000 mg   There are no Patient Instructions on file for this visit.  I personally performed the services described in this documentation, which was scribed in my presence. The recorded information has been reviewed and considered, and addended by me as needed.   Signed,   [name], MD [facility].  06/22/16 2:28 PM

## 2016-06-22 NOTE — Telephone Encounter (Signed)
Her temperature has went up and she has been in and out of hospital. Pt needs a call back.  Please advise

## 2016-06-22 NOTE — ED Notes (Signed)
PA made aware of pt temp.

## 2016-06-22 NOTE — ED Provider Notes (Signed)
[place] DEPT Provider Note   CSN: [id number] Arrival date & time: 06/22/16  1459     History   Chief Complaint Chief Complaint  Patient presents with  . Recurrent UTI  . Abnormal Lab  . Fever    HPI Natalie [name] is a 65 y.o. female.  HPI   65 year old female with significant past medical history of diabetes type 2, migraine headaches, acute kidney injury after pyelonephritis and sepsis in July . Pt was admitted in July with pyelonephritis, then again on August 15 th after finding  large right renal stone. She underwent nephrolithotomy in august performed by Dr. Eulogio Ditch. She reports feeling well recently until two days ago. She reports that she started to develop symptoms similar to previous infections; right flank pain and fever. She was seen at urgent care given a shot of rocephin yesterday. She notes onset of fever last night of 102.8. She was seen by urgent care with temperature of 103. She was sent to ED for further evaluation. At the time of evaluation patient reports right flank pain and fever. She denies any dysuria, abdominal pain, N/V/D, or any other infectious etiology. She received tylenol prior to arrival.     Past Medical History:  Diagnosis Date  . Chronic kidney disease    kidney stones, right and left  . Diabetes mellitus without complication ([place])   . Headache    migraines  . Hyperlipidemia   . Hypertension   . Seizures ([place])    none x 30 yrs    Patient Active Problem List   Diagnosis Date Noted  . Renal stone 05/14/2016  . Sepsis ([place]) 04/10/2016  . Acute kidney injury ([place]) 04/10/2016  . Type 2 diabetes mellitus with other diabetic kidney complication ([place]) 79/89/2119  . Migraine, chronic, without aura, intractable, with status migrainosus 01/04/2016  . Abdominal pain   . AKI (acute kidney injury) ([place])   . Pyelonephritis 08/31/2014  . 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 BMI  33.0-33.9,adult 03/30/2013  . Recurrent UTI 09/17/2012  . DM (diabetes mellitus) ([place]) 03/19/2012  . HTN (hypertension) 03/19/2012  . Hyperlipemia 03/19/2012  . Hypothyroidism 03/19/2012  . Seizure disorder ([place]) 03/19/2012  . Insomnia 03/19/2012  . Migraine 03/19/2012    Past Surgical History:  Procedure Laterality Date  . CESAREAN SECTION    . CHOLECYSTECTOMY    . KIDNEY STONE SURGERY    . NEPHROLITHOTOMY Right 05/14/2016   Procedure: NEPHROLITHOTOMY PERCUTANEOUS;  Surgeon: Franchot Gallo, MD;  Location: [facility];  Service: Urology;  Laterality: Right;  . SHOULDER ARTHROSCOPY Bilateral     OB History    No data available       Home Medications    Prior to Admission medications   Medication Sig Start Date End Date Taking? Authorizing Provider  amLODipine (NORVASC) 5 MG tablet Take 1 tablet (5 mg total) by mouth daily. 05/31/16  Yes Shawnee Knapp, MD  atorvastatin (LIPITOR) 20 MG tablet Take 1 tablet (20 mg total) by mouth daily. 01/04/16  Yes Leandrew Koyanagi, MD  butorphanol (STADOL) 10 MG/ML nasal spray Place 1 spray into the nose every 4 (four) hours as needed for migraine. May refill weekly. 05/31/16  Yes Shawnee Knapp, MD  glipiZIDE (GLUCOTROL) 5 MG tablet Take 1 tablet (5 mg total) by mouth 2 (two) times daily before a meal. 05/31/16  Yes Shawnee Knapp, MD  lactobacillus acidophilus (BACID) TABS tablet Take 2 tablets by mouth 3 (three) times  daily.   Yes Historical Provider, MD  levothyroxine (SYNTHROID, LEVOTHROID) 75 MCG tablet Take 1 tablet (75 mcg total) by mouth daily. 01/04/16  Yes Leandrew Koyanagi, MD  PHENobarbital (LUMINAL) 97.2 MG tablet Take 1-2 tablets (97.2-194.4 mg total) by mouth daily. TAKE TWO TABLETS BY MOUTH ONCE DAILY ON  MON,  WED,  AND  FRI  AND  1  TABLET  DAILY  ON  OTHER  DAYS 05/31/16  Yes Shawnee Knapp, MD  polycarbophil (FIBERCON) 625 MG tablet Take 625 mg by mouth daily.   Yes Historical Provider, MD  zolpidem (AMBIEN) 5 MG tablet take 1 tablet by mouth at  bedtime for sleep if needed. May repeat x 1 in 1 hr if needed 05/31/16  Yes Shawnee Knapp, MD  ondansetron (ZOFRAN-ODT) 8 MG disintegrating tablet DISSOLVE 1 TABLET ON OR UNDER THE TONGUE EVERY 8 HOURS AS NEEDED FOR NAUSEA 05/31/16   Shawnee Knapp, MD    Family History Family History  Problem Relation Age of Onset  . Heart disease Father   . Diabetes Father     Social History Social History  Substance Use Topics  . Smoking status: Never Smoker  . Smokeless tobacco: Never Used  . Alcohol use No     Allergies   Compazine; Cymbalta [duloxetine hcl]; Escitalopram oxalate; Methadone; and Nitrofuran derivatives   Review of Systems Review of Systems  All other systems reviewed and are negative.  Physical Exam Updated Vital Signs BP (!) 113/53 (BP Location: Left Arm)   Pulse 98   Temp 99.3 F (37.4 C) (Oral)   Resp 20   Ht 4\' 8"  (1.422 m)   Wt 66.4 kg   SpO2 98%   BMI 32.80 kg/m   Physical Exam  Constitutional: She is oriented to person, place, and time. She appears well-developed and well-nourished.  HENT:  Head: Normocephalic and atraumatic.  Eyes: Conjunctivae are normal. Pupils are equal, round, and reactive to light. Right eye exhibits no discharge. Left eye exhibits no discharge. No scleral icterus.  Neck: Normal range of motion. No JVD present. No tracheal deviation present.  Pulmonary/Chest: Effort normal. No stridor.  Abdominal: Soft. She exhibits no distension. There is no tenderness.  Right CVA tenderness   Neurological: She is alert and oriented to person, place, and time. Coordination normal.  Psychiatric: She has a normal mood and affect. Her behavior is normal. Judgment and thought content normal.  Nursing note and vitals reviewed.   ED Treatments / Results  Labs (all labs ordered are listed, but only abnormal results are displayed) Labs Reviewed  COMPREHENSIVE METABOLIC PANEL - Abnormal; Notable for the following:       Result Value   Sodium 134 (*)     CO2 19 (*)    Glucose, Bld 149 (*)    BUN 23 (*)    Creatinine, Ser 1.47 (*)    ALT 13 (*)    GFR calc non Af Amer 36 (*)    GFR calc Af Amer 41 (*)    All other components within normal limits  CBC WITH DIFFERENTIAL/PLATELET - Abnormal; Notable for the following:    WBC 12.5 (*)    RBC 3.19 (*)    Hemoglobin 8.5 (*)    HCT 27.1 (*)    Neutro Abs 9.1 (*)    Monocytes Absolute 2.3 (*)    All other components within normal limits  URINALYSIS, ROUTINE W REFLEX MICROSCOPIC (NOT AT [facility]) - Abnormal; Notable for the following:  APPearance CLOUDY (*)    Hgb urine dipstick LARGE (*)    Protein, ur 30 (*)    Leukocytes, UA LARGE (*)    All other components within normal limits  URINE MICROSCOPIC-ADD ON - Abnormal; Notable for the following:    Squamous Epithelial / LPF 0-5 (*)    Bacteria, UA MANY (*)    All other components within normal limits  GLUCOSE, CAPILLARY - Abnormal; Notable for the following:    Glucose-Capillary 180 (*)    All other components within normal limits  CULTURE, BLOOD (ROUTINE X 2)  CULTURE, BLOOD (ROUTINE X 2)  URINE CULTURE  PROCALCITONIN  HEMOGLOBIN A1C  MAGNESIUM  PHOSPHORUS  TSH  COMPREHENSIVE METABOLIC PANEL  CBC  VITAMIN B12  FOLATE  IRON AND TIBC  FERRITIN  RETICULOCYTES  I-STAT CG4 LACTIC ACID, ED  I-STAT CG4 LACTIC ACID, ED    EKG  EKG Interpretation  Date/Time:  Friday June 22 2016 16:14:49 EDT Ventricular Rate:  91 PR Interval:    QRS Duration: 98 QT Interval:  373 QTC Calculation: 459 R Axis:   35 Text Interpretation:  Sinus rhythm Low voltage, extremity leads Abnormal R-wave progression, early transition Confirmed by TEGELER MD, CHRISTOPHER [phone]) on 06/22/2016 5:17:02 PM       Radiology Ct Renal Stone Study  Result Date: 06/22/2016 CLINICAL DATA:  Flank pain, fever, history of stones EXAM: CT ABDOMEN AND PELVIS WITHOUT CONTRAST TECHNIQUE: Multidetector CT imaging of the abdomen and pelvis was performed following  the standard protocol without IV contrast. COMPARISON:  04/11/2016 FINDINGS: Motion degraded images. Lower chest: Lung bases are clear. Hepatobiliary: Unenhanced liver is unremarkable. Status post cholecystectomy. No intrahepatic or extrahepatic ductal dilatation. Pancreas: Within normal limits. Spleen: Within normal limits. Adrenals/Urinary Tract: Adrenal glands are within normal limits. 10 mm hemorrhagic posterior right upper pole renal cyst (series 3/image 22). 2 mm nonobstructing right lower pole renal calculus (series 3/image 34). Mild right hydronephrosis with indwelling right double-pigtail ureteral stent. Left renal scarring/ atrophy.  No renal calculi or hydronephrosis. Bladder is within normal limits. Stomach/Bowel: Stomach is notable for a moderate hiatal hernia. No evidence of bowel obstruction. Appendix is not discretely visualized. Mild sigmoid diverticulosis, without evidence of diverticulitis Vascular/Lymphatic: No evidence of abdominal aortic aneurysm. Atherosclerotic calcifications of the abdominal aorta and branch vessels. No suspicious abdominopelvic lymphadenopathy. Reproductive: Uterus is within normal limits. Bilateral ovaries are within normal limits. Other: No abdominopelvic ascites. Musculoskeletal: Degenerative changes of the visualized thoracolumbar spine. IMPRESSION: Motion degraded images. 2 mm nonobstructing right lower pole renal calculus. Mild right hydronephrosis with indwelling right double pigtail ureteral stent. Left renal scarring/ atrophy. Additional ancillary findings as above. Electronically Signed   By: Julian Hy M.D.   On: 06/22/2016 19:17    Procedures Procedures (including critical care time)  Medications Ordered in ED Medications  vancomycin (VANCOCIN) 500 mg in sodium chloride 0.9 % 100 mL IVPB (not administered)  insulin aspart (novoLOG) injection 0-5 Units (0 Units Subcutaneous Not Given 06/22/16 2326)  insulin aspart (novoLOG) injection 0-9 Units  (not administered)  butorphanol (STADOL) nasal spray 1 spray (1 spray Nasal Given 06/23/16 0124)  PHENobarbital (LUMINAL) tablet 194.4 mg (not administered)  zolpidem (AMBIEN) tablet 5 mg (not administered)  atorvastatin (LIPITOR) tablet 20 mg (not administered)  levothyroxine (SYNTHROID, LEVOTHROID) tablet 75 mcg (not administered)  sodium chloride flush (NS) 0.9 % injection 3 mL (3 mLs Intravenous Given 06/23/16 0014)  acetaminophen (TYLENOL) tablet 650 mg (not administered)    Or  acetaminophen (TYLENOL)  suppository 650 mg (not administered)  HYDROcodone-acetaminophen (NORCO/VICODIN) 5-325 MG per tablet 1-2 tablet (2 tablets Oral Not Given 06/23/16 0005)  ondansetron (ZOFRAN) tablet 4 mg (not administered)    Or  ondansetron (ZOFRAN) injection 4 mg (not administered)  enoxaparin (LOVENOX) injection 30 mg (30 mg Subcutaneous Given 06/23/16 0005)  0.9 %  sodium chloride infusion ( Intravenous New Bag/Given 06/23/16 0005)  PHENobarbital (LUMINAL) tablet 97.2 mg (not administered)  cefTRIAXone (ROCEPHIN) 1 g in dextrose 5 % 50 mL IVPB (not administered)  sodium chloride 0.9 % bolus 1,000 mL (0 mLs Intravenous Stopped 06/22/16 1725)    And  sodium chloride 0.9 % bolus 1,000 mL (0 mLs Intravenous Stopped 06/22/16 1759)    And  sodium chloride 0.9 % bolus 250 mL (0 mLs Intravenous Stopped 06/22/16 1657)  ceFEPIme (MAXIPIME) 2 g in dextrose 5 % 50 mL IVPB (0 g Intravenous Stopped 06/22/16 1644)  morphine 4 MG/ML injection 4 mg (4 mg Intravenous Given 06/22/16 1615)  vancomycin (VANCOCIN) IVPB 1000 mg/200 mL premix (0 mg Intravenous Stopped 06/22/16 1750)  morphine 4 MG/ML injection 4 mg (4 mg Intravenous Given 06/22/16 1945)  acetaminophen (TYLENOL) tablet 650 mg (650 mg Oral Given 06/22/16 2021)     Initial Impression / Assessment and Plan / ED Course  I have reviewed the triage vital signs and the nursing notes.  Pertinent labs & imaging results that were available during my care of the patient  were reviewed by me and considered in my medical decision making (see chart for details).  Clinical Course     Final Clinical Impressions(s) / ED Diagnoses   Final diagnoses:  Pyelonephritis    Labs: Lactic acid 0.78, WBC 12.5  Imaging:CT renal  Consults: Dr. Tresa Moore  Therapeutics:Vancomycin, acetaminophen, morphine, normal saline  Discharge Meds:   Assessment/Plan:  Patient's presentation is most consistent with pyelonephritis with sepsis. Cefepime ordered, sepsis labs drawn. Patient appears stable at this time.  1:34 AM pharmacy called stating that patient's blood cultures from yesterday grew out enterococcus, cefepime unlikely not sensitive and recommended vancomycin would be most appropriate pending sensitivities. Patient's antibiotics switched to vancomycin at this time.  Patient CT scan returned showing a 2 mm right-sided nonobstructing stone with mild right-sided hydronephrosis. Patient requiring hospital admission. Hospitalist consulted for admission.  New Prescriptions Current Discharge Medication List       Okey Regal, PA-C 06/23/16 0134    Gwenyth Allegra Tegeler, MD 06/23/16 1159

## 2016-06-22 NOTE — ED Notes (Signed)
Bed: DS28 Expected date:  Expected time:  Means of arrival:  Comments: EMS- 65yo F, possible sepsis/pylo?

## 2016-06-22 NOTE — Consult Note (Signed)
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 female.   HPI:  1 - Recurrent Nephrolithiasis - s/p right percutaneous nephrostolithotomy on 05/14/16 by [facility] for >2cm UPJ stone. Antegrade JJ stent placed at time and remains in situ. Tentativley scheduled for f/u and office stent removal 07/2016. CT on admit with very small non-obstructing residual stone volume in low-risk lower pole location and chronic hydro that is stable and stent in good position.   2 - Urosepsis - fevers, leukocytosis, tachycardia, lactic acidosis on ER presentation 92017. Prior psudomonas UTI x several but enterococcus form most recent CX obtained at PCP office 9/21 / pending.   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 Imaging with left atrophic kidney x several and stone free.  Today Amulya is seen in consultation for above.    Past Medical History:  Diagnosis Date  . Chronic kidney disease    kidney stones, right and left  . Diabetes mellitus without complication ([place])   . Headache    migraines  . Hyperlipidemia   . Hypertension   . Seizures ([place])    none x 30 yrs    Past Surgical History:  Procedure Laterality Date  . CESAREAN SECTION    . CHOLECYSTECTOMY    . KIDNEY STONE SURGERY    . NEPHROLITHOTOMY Right 05/14/2016   Procedure: NEPHROLITHOTOMY PERCUTANEOUS;  Surgeon: Franchot Gallo, MD;  Location: [facility];  Service: Urology;  Laterality: Right;  . SHOULDER ARTHROSCOPY Bilateral     Family History  Problem Relation Age of Onset  . Heart disease Father   . Diabetes Father     Social History:  reports that she has never smoked. She has never used smokeless tobacco. She reports that she does not drink alcohol or use drugs.  Allergies:  Allergies  Allergen Reactions  . Compazine     Bad headache, vomiting    . Cymbalta [Duloxetine Hcl]     unknown  . Escitalopram Oxalate Other (See Comments)    Can't sleep  . Methadone Hives  . Nitrofuran Derivatives     unknown    Medications: I have reviewed the patient's current medications.  Results for orders placed or performed during the hospital encounter of 06/22/16 (from the past 48 hour(s))  Urinalysis, Routine w reflex microscopic (not at [facility])     Status: Abnormal   Collection Time: 06/22/16  3:39 PM  Result Value Ref Range   Color, Urine YELLOW YELLOW   APPearance CLOUDY (A) CLEAR   Specific Gravity, Urine 1.006 1.005 - 1.030   pH 6.0 5.0 - 8.0   Glucose, UA NEGATIVE NEGATIVE mg/dL   Hgb urine dipstick LARGE (A) NEGATIVE   Bilirubin Urine NEGATIVE NEGATIVE   Ketones, ur NEGATIVE NEGATIVE mg/dL   Protein, ur 30 (A) NEGATIVE mg/dL   Nitrite NEGATIVE NEGATIVE   Leukocytes, UA LARGE (A) NEGATIVE  Urine microscopic-add on     Status: Abnormal   Collection Time: 06/22/16  3:39 PM  Result Value Ref Range   Squamous Epithelial / LPF 0-5 (A) NONE SEEN   WBC, UA TOO NUMEROUS TO COUNT 0 - 5 WBC/hpf   RBC / HPF 6-30 0 - 5 RBC/hpf   Bacteria, UA MANY (A) NONE SEEN  Comprehensive metabolic panel     Status: Abnormal   Collection Time: 06/22/16  3:44 PM  Result Value Ref Range   Sodium 134 (L) 135 - 145 mmol/L   Potassium 3.7 3.5 - 5.1 mmol/L   Chloride 104 101 - 111 mmol/L   CO2 19 (L) 22 - 32 mmol/L   Glucose, Bld 149 (H) 65 - 99 mg/dL   BUN 23 (H) 6 - 20 mg/dL   Creatinine, Ser 1.47 (H) 0.44 - 1.00 mg/dL   Calcium 9.1 8.9 - 10.3 mg/dL   Total Protein 7.4 6.5 - 8.1 g/dL   Albumin 3.8 3.5 - 5.0 g/dL   AST 15 15 - 41 U/L   ALT 13 (L) 14 - 54 U/L   Alkaline Phosphatase 50 38 - 126 U/L   Total Bilirubin 0.5 0.3 - 1.2 mg/dL   GFR calc non Af Amer 36 (L) >60 mL/min   GFR calc Af Amer 41 (L) >60 mL/min    Comment: (NOTE) The eGFR has been calculated using the CKD EPI equation. This calculation has not been validated in all clinical  situations. eGFR's persistently <60 mL/min signify possible Chronic Kidney Disease.    Anion gap 11 5 - 15  CBC WITH DIFFERENTIAL     Status: Abnormal   Collection Time: 06/22/16  3:44 PM  Result Value Ref Range   WBC 12.5 (H) 4.0 - 10.5 K/uL   RBC 3.19 (L) 3.87 - 5.11 MIL/uL   Hemoglobin 8.5 (L) 12.0 - 15.0 g/dL   HCT 27.1 (L) 36.0 - 46.0 %   MCV 85.0 78.0 - 100.0 fL   MCH 26.6 26.0 - 34.0 pg   MCHC 31.4 30.0 - 36.0 g/dL   RDW 14.3 11.5 - 15.5 %   Platelets 303 150 - 400 K/uL   Neutrophils Relative % 73 %   Lymphocytes Relative 9 %   Monocytes Relative 18 %   Eosinophils Relative 0 %   Basophils Relative 0 %   Neutro Abs 9.1 (H) 1.7 - 7.7 K/uL   Lymphs Abs 1.1 0.7 - 4.0 K/uL   Monocytes Absolute 2.3 (H) 0.1 - 1.0 K/uL   Eosinophils Absolute 0.0 0.0 - 0.7 K/uL   Basophils Absolute 0.0 0.0 - 0.1 K/uL   Smear Review MORPHOLOGY UNREMARKABLE   I-Stat CG4 Lactic Acid, ED  (not at  [facility])     Status: None   Collection Time: 06/22/16  3:56 PM  Result Value Ref Range   Lactic Acid, Venous 0.78 0.5 - 1.9 mmol/L    Ct Renal Stone Study  Result Date: 06/22/2016 CLINICAL DATA:  Flank pain, fever, history of stones EXAM: CT ABDOMEN AND PELVIS WITHOUT CONTRAST TECHNIQUE: Multidetector CT imaging of the abdomen and pelvis was performed following the standard protocol without IV contrast. COMPARISON:  04/11/2016 FINDINGS: Motion degraded images. Lower chest: Lung bases are clear. Hepatobiliary: Unenhanced liver is unremarkable. Status post cholecystectomy. No intrahepatic or extrahepatic ductal dilatation. Pancreas: Within normal limits. Spleen: Within normal limits. Adrenals/Urinary Tract: Adrenal glands are within normal limits. 10 mm hemorrhagic posterior right upper pole renal cyst (series 3/image 22). 2 mm nonobstructing right lower pole renal calculus (series 3/image 34). Mild right hydronephrosis with indwelling right double-pigtail ureteral stent. Left renal scarring/ atrophy.  No renal  calculi or hydronephrosis. Bladder is within normal limits. Stomach/Bowel: Stomach is notable for a moderate hiatal hernia. No evidence of bowel obstruction. Appendix is not discretely visualized. Mild sigmoid diverticulosis, without evidence of diverticulitis Vascular/Lymphatic: No evidence of abdominal aortic aneurysm. Atherosclerotic calcifications of the abdominal aorta and branch vessels. No suspicious abdominopelvic lymphadenopathy. Reproductive: Uterus  is within normal limits. Bilateral ovaries are within normal limits. Other: No abdominopelvic ascites. Musculoskeletal: Degenerative changes of the visualized thoracolumbar spine. IMPRESSION: Motion degraded images. 2 mm nonobstructing right lower pole renal calculus. Mild right hydronephrosis with indwelling right double pigtail ureteral stent. Left renal scarring/ atrophy. Additional ancillary findings as above. Electronically Signed   By: Julian Hy M.D.   On: 06/22/2016 19:17    Review of Systems  Constitutional: Positive for chills, fever and malaise/fatigue.  HENT: Negative.   Eyes: Negative.   Respiratory: Negative.   Cardiovascular: Negative.   Gastrointestinal: Negative.   Genitourinary: Positive for dysuria. Negative for flank pain.  Musculoskeletal: Negative.   Skin: Negative.   Neurological: Negative.   Endo/Heme/Allergies: Negative.   Psychiatric/Behavioral: Negative.    Blood pressure 144/63, pulse 115, temperature (!) 103.2 F (39.6 C), temperature source Oral, resp. rate 23, height '4\' 8"'  (1.422 m), weight 64.9 kg (143 lb), SpO2 93 %. Physical Exam  Constitutional: She is oriented to person, place, and time. She appears well-developed.  Husband at bedside  HENT:  Head: Normocephalic.  Eyes: Pupils are equal, round, and reactive to light.  Neck: Normal range of motion.  Cardiovascular: Normal rate.   Respiratory: Effort normal.  GI: Soft.  Genitourinary:  Genitourinary Comments: NO CVAT. Recent Rt flank PCNL  site c/d/i, no hematomas. Completely mucosalized / healed.   Musculoskeletal: Normal range of motion.  Neurological: She is alert and oriented to person, place, and time.  Skin: Skin is warm.  Psychiatric: She has a normal mood and affect. Her behavior is normal. Judgment and thought content normal.    Assessment/Plan:   1 - Recurrent Nephrolithiasis - present stone burden quite small and non-obstructing. Do not favor any GU instrumentation in setting of active infection. Her Rt sided stent is in good position. Ideally, will be removed as planned in few weeks once clears infectious parameters.   2 - Urosepsis - agree with empiric vanc + cephalosporin at present pending further CX data. It appears new organism (enterococcus) compared to her prior psudeomonas.   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 No GU obstruction by imaging.  We will make Dr. Diona Fanti aware of pt's admission on Monday. Please call me directly with questions over the weekend.   Antonae Zbikowski 06/22/2016, 9:06 PM

## 2016-06-22 NOTE — H&P (Addendum)
Natalie [name] DGL:[id number] DOB: 1951/09/16 DOA: 06/22/2016     PCP: Wendie Agreste, MD   Outpatient Specialists: [facility]   Patient coming from:    home Lives  With family    Chief Complaint: Back pain and fever  HPI: Natalie [name] is a 65 y.o. female with medical history significant of DM 2, CK D  Migraine headaches, history of kidney stone status post nephrolithotomy,  Presented with back pain nausea vomiting fever and chills fever at home as high as 103. She presented to urgent care clinic and was given Rocephin 1 g creatinine was noted to be at 1.46 which is still elevated but improved from prior. Urine cultures were obtained which grew Enterobacter. Her follow-up appointment with urology as an October 11. Patient denies seeing any blood in her urine she had not had any cough no leg swelling.  Patient have had recent recurrent admissions in July she was admitted for pyelonephritis and sepsis with renal pelvis stone and severe right hydronephrosis. In August 7 15 she was admitted again for PCNL for large right renal stone burden She reports feeling a bit better since she came to ER.  Patient has hx of seizure disorder she is taking phenobarbital for this last seizure was few years ago.  IN ER:  Temp (24hrs), Avg:102.2 F (39 C), Min:100.5 F (38.1 C), Max:103.2 F (39.6 C)     Heart rate of 215 blood pressure 1 4463 respirations 23  Lactic acid 0.78 CR 1.47 WBC 12.5 hemoglobin 8.5 which is around baseline CT showing 2 mm nonobstructing right lower pole renal calculus mild right hydronephrosis with right double pigtail ureteral stent Following Medications were ordered in ER: Medications  vancomycin (VANCOCIN) 500 mg in sodium chloride 0.9 % 100 mL IVPB (not administered)  sodium chloride 0.9 % bolus 1,000 mL (0 mLs Intravenous Stopped 06/22/16 1725)    And  sodium chloride 0.9 % bolus 1,000 mL (0 mLs Intravenous Stopped 06/22/16 1759)    And  sodium chloride 0.9 % bolus  250 mL (250 mLs Intravenous New Bag/Given 06/22/16 1650)  ceFEPIme (MAXIPIME) 2 g in dextrose 5 % 50 mL IVPB (0 g Intravenous Stopped 06/22/16 1644)  morphine 4 MG/ML injection 4 mg (4 mg Intravenous Given 06/22/16 1615)  vancomycin (VANCOCIN) IVPB 1000 mg/200 mL premix (0 mg Intravenous Stopped 06/22/16 1750)  morphine 4 MG/ML injection 4 mg (4 mg Intravenous Given 06/22/16 1945)  acetaminophen (TYLENOL) tablet 650 mg (650 mg Oral Given 06/22/16 2021)     ER provider discussed case with:  Urology Dr. Tresa Moore requesting consult  Hospitalist was called for admission for sepsis secondary to pyelonephritis associated with ureteral stent and nonobstructing nephrolithiasis  Review of Systems:    Pertinent positives include: Fevers, chills, fatigue, headaches,dysuria  change in color of urine, flank pain. nausea,  Constitutional:  No weight loss, night sweats, weight loss  HEENT:  No  Difficulty swallowing,Tooth/dental problems,Sore throat,  No sneezing, itching, ear ache, nasal congestion, post nasal drip,  Cardio-vascular:  No chest pain, Orthopnea, PND, anasarca, dizziness, palpitations.no Bilateral lower extremity swelling  GI:  No heartburn, indigestion, abdominal pain,vomiting, diarrhea, change in bowel habits, loss of appetite, melena, blood in stool, hematemesis Resp:  no shortness of breath at rest. No dyspnea on exertion, No excess mucus, no productive cough, No non-productive cough, No coughing up of blood.No change in color of mucus.No wheezing. Skin:  no rash or lesions. No jaundice GU:  no , no urgency or frequency. No  straining to urinate.  No   Musculoskeletal:  No joint pain or no joint swelling. No decreased range of motion. No back pain.  Psych:  No change in mood or affect. No depression or anxiety. No memory loss.  Neuro: no localizing neurological complaints, no tingling, no weakness, no double vision, no gait abnormality, no slurred speech, no confusion  As per HPI  otherwise 10 point review of systems negative.   Past Medical History: Past Medical History:  Diagnosis Date  . Chronic kidney disease    kidney stones, right and left  . Diabetes mellitus without complication ([place])   . Headache    migraines  . Hyperlipidemia   . Hypertension   . Seizures ([place])    none x 30 yrs   Past Surgical History:  Procedure Laterality Date  . CESAREAN SECTION    . CHOLECYSTECTOMY    . KIDNEY STONE SURGERY    . NEPHROLITHOTOMY Right 05/14/2016   Procedure: NEPHROLITHOTOMY PERCUTANEOUS;  Surgeon: Franchot Gallo, MD;  Location: [facility];  Service: Urology;  Laterality: Right;  . SHOULDER ARTHROSCOPY Bilateral      Social History:  Ambulatory   Independently     reports that she has never smoked. She has never used smokeless tobacco. She reports that she does not drink alcohol or use drugs.  Allergies:   Allergies  Allergen Reactions  . Compazine     Bad headache, vomiting   . Cymbalta [Duloxetine Hcl]     unknown  . Escitalopram Oxalate Other (See Comments)    Can't sleep  . Methadone Hives  . Nitrofuran Derivatives     unknown       Family History:   Family History  Problem Relation Age of Onset  . Heart disease Father   . Diabetes Father     Medications: Prior to Admission medications   Medication Sig Start Date End Date Taking? Authorizing Provider  amLODipine (NORVASC) 5 MG tablet Take 1 tablet (5 mg total) by mouth daily. 05/31/16  Yes Shawnee Knapp, MD  atorvastatin (LIPITOR) 20 MG tablet Take 1 tablet (20 mg total) by mouth daily. 01/04/16  Yes Leandrew Koyanagi, MD  butorphanol (STADOL) 10 MG/ML nasal spray Place 1 spray into the nose every 4 (four) hours as needed for migraine. May refill weekly. 05/31/16  Yes Shawnee Knapp, MD  glipiZIDE (GLUCOTROL) 5 MG tablet Take 1 tablet (5 mg total) by mouth 2 (two) times daily before a meal. 05/31/16  Yes Shawnee Knapp, MD  lactobacillus acidophilus (BACID) TABS tablet Take 2 tablets by mouth 3  (three) times daily.   Yes Historical Provider, MD  levothyroxine (SYNTHROID, LEVOTHROID) 75 MCG tablet Take 1 tablet (75 mcg total) by mouth daily. 01/04/16  Yes Leandrew Koyanagi, MD  PHENobarbital (LUMINAL) 97.2 MG tablet Take 1-2 tablets (97.2-194.4 mg total) by mouth daily. TAKE TWO TABLETS BY MOUTH ONCE DAILY ON  MON,  WED,  AND  FRI  AND  1  TABLET  DAILY  ON  OTHER  DAYS 05/31/16  Yes Shawnee Knapp, MD  polycarbophil (FIBERCON) 625 MG tablet Take 625 mg by mouth daily.   Yes Historical Provider, MD  zolpidem (AMBIEN) 5 MG tablet take 1 tablet by mouth at bedtime for sleep if needed. May repeat x 1 in 1 hr if needed 05/31/16  Yes Shawnee Knapp, MD  ondansetron (ZOFRAN-ODT) 8 MG disintegrating tablet DISSOLVE 1 TABLET ON OR UNDER THE TONGUE EVERY 8 HOURS AS NEEDED  FOR NAUSEA 05/31/16   Shawnee Knapp, MD    Physical Exam: Patient Vitals for the past 24 hrs:  BP Temp Temp src Pulse Resp SpO2 Height Weight  06/22/16 2030 144/63 - - 115 23 93 % - -  06/22/16 1948 148/69 (!) 103.2 F (39.6 C) Oral 116 19 94 % - -  06/22/16 1830 112/84 - - 117 22 90 % - -  06/22/16 1730 124/60 - - 91 18 100 % - -  06/22/16 1700 119/59 - - 93 17 98 % - -  06/22/16 1630 105/56 - - 90 16 93 % - -  06/22/16 1616 - - - - - - 4\' 8"  (1.422 m) 64.9 kg (143 lb)  06/22/16 1518 110/56 100.5 F (38.1 C) Oral 101 20 96 % - -  06/22/16 1509 - - - - - 96 % - -    1. General:  in No Acute distress 2. Psychological: Alert and   Oriented 3. Head/ENT:     Dry Mucous Membranes                          Head Non traumatic, neck supple                          Normal   Dentition 4. SKIN:  decreased Skin turgor,  Skin clean Dry and intact no rash 5. Heart: Regular rate and rhythm no Murmur, Rub or gallop 6. Lungs:  Clear to auscultation bilaterally, no wheezes or crackles   7. Abdomen: Soft,  non-tender, Non distended 8. Lower extremities: no clubbing, cyanosis, or edema 9. Neurologically Grossly intact, moving all 4 extremities  equally  10. MSK: Normal range of motion, costovertebral tenderness.    body mass index is 32.06 kg/m.  Labs on Admission:   Labs on Admission: I have personally reviewed following labs and imaging studies  CBC:  Recent Labs Lab 06/22/16 1425 06/22/16 1544  WBC 16.9* 12.5*  NEUTROABS  --  9.1*  HGB 9.2* 8.5*  HCT 28.4* 27.1*  MCV 82.8 85.0  PLT  --  696   Basic Metabolic Panel:  Recent Labs Lab 06/21/16 1209 06/22/16 1544  NA 136 134*  K 5.0 3.7  CL 102 104  CO2 22 19*  GLUCOSE 156* 149*  BUN 27* 23*  CREATININE 1.46* 1.47*  CALCIUM 9.5 9.1   GFR: Estimated Creatinine Clearance: 28 mL/min (by C-G formula based on SCr of 1.47 mg/dL (H)). Liver Function Tests:  Recent Labs Lab 06/21/16 1209 06/22/16 1544  AST 12 15  ALT 8 13*  ALKPHOS 60 50  BILITOT 0.2 0.5  PROT 6.6 7.4  ALBUMIN 4.0 3.8   No results for input(s): LIPASE, AMYLASE in the last 168 hours. No results for input(s): AMMONIA in the last 168 hours. Coagulation Profile: No results for input(s): INR, PROTIME in the last 168 hours. Cardiac Enzymes: No results for input(s): CKTOTAL, CKMB, CKMBINDEX, TROPONINI in the last 168 hours. BNP (last 3 results) No results for input(s): PROBNP in the last 8760 hours. HbA1C: No results for input(s): HGBA1C in the last 72 hours. CBG: No results for input(s): GLUCAP in the last 168 hours. Lipid Profile: No results for input(s): CHOL, HDL, LDLCALC, TRIG, CHOLHDL, LDLDIRECT in the last 72 hours. Thyroid Function Tests: No results for input(s): TSH, T4TOTAL, FREET4, T3FREE, THYROIDAB in the last 72 hours. Anemia Panel: No results for input(s): VITAMINB12, FOLATE,  FERRITIN, TIBC, IRON, RETICCTPCT in the last 72 hours. Urine analysis:    Component Value Date/Time   COLORURINE YELLOW 06/22/2016 1539   APPEARANCEUR CLOUDY (A) 06/22/2016 1539   LABSPEC 1.006 06/22/2016 1539   PHURINE 6.0 06/22/2016 1539   GLUCOSEU NEGATIVE 06/22/2016 1539   HGBUR LARGE  (A) 06/22/2016 1539   BILIRUBINUR NEGATIVE 06/22/2016 1539   BILIRUBINUR negative 06/21/2016 1205   BILIRUBINUR neg 11/30/2014 1834   KETONESUR NEGATIVE 06/22/2016 1539   PROTEINUR 30 (A) 06/22/2016 1539   UROBILINOGEN 0.2 06/21/2016 1205   UROBILINOGEN 0.2 08/31/2014 2056   NITRITE NEGATIVE 06/22/2016 1539   LEUKOCYTESUR LARGE (A) 06/22/2016 1539   Sepsis Labs: @LABRCNTIP (procalcitonin:4,lacticidven:4) ) Recent Results (from the past 240 hour(s))  Urine culture     Status: None (Preliminary result)   Collection Time: 06/21/16 12:02 PM  Result Value Ref Range Status   [place] Count >=100,000 COLONIES/ML  Preliminary   Preliminary Report ENTEROCOCCUS SPECIES  Preliminary      UA  evidence of UTI   Lab Results  Component Value Date   HGBA1C 7.7 (H) 04/11/2016    Estimated Creatinine Clearance: 28 mL/min (by C-G formula based on SCr of 1.47 mg/dL (H)).  BNP (last 3 results) No results for input(s): PROBNP in the last 8760 hours.   ECG REPORT  Independently reviewed Rate: 91  Rhythm: Normal sinus rhythm ST&T Change: No acute ischemic changes   QTC 451  Filed Weights   06/22/16 1616  Weight: 64.9 kg (143 lb)     Cultures:    Component Value Date/Time   SDES Urine 04/27/2016 2051   [place] NONE 04/27/2016 2051   CULT >=100,000 COLONIES/mL YEAST (A) 04/27/2016 2051   REPTSTATUS 04/29/2016 FINAL 04/27/2016 2051     Radiological Exams on Admission: Ct Renal Stone Study  Result Date: 06/22/2016 CLINICAL DATA:  Flank pain, fever, history of stones EXAM: CT ABDOMEN AND PELVIS WITHOUT CONTRAST TECHNIQUE: Multidetector CT imaging of the abdomen and pelvis was performed following the standard protocol without IV contrast. COMPARISON:  04/11/2016 FINDINGS: Motion degraded images. Lower chest: Lung bases are clear. Hepatobiliary: Unenhanced liver is unremarkable. Status post cholecystectomy. No intrahepatic or extrahepatic ductal dilatation. Pancreas: Within normal  limits. Spleen: Within normal limits. Adrenals/Urinary Tract: Adrenal glands are within normal limits. 10 mm hemorrhagic posterior right upper pole renal cyst (series 3/image 22). 2 mm nonobstructing right lower pole renal calculus (series 3/image 34). Mild right hydronephrosis with indwelling right double-pigtail ureteral stent. Left renal scarring/ atrophy.  No renal calculi or hydronephrosis. Bladder is within normal limits. Stomach/Bowel: Stomach is notable for a moderate hiatal hernia. No evidence of bowel obstruction. Appendix is not discretely visualized. Mild sigmoid diverticulosis, without evidence of diverticulitis Vascular/Lymphatic: No evidence of abdominal aortic aneurysm. Atherosclerotic calcifications of the abdominal aorta and branch vessels. No suspicious abdominopelvic lymphadenopathy. Reproductive: Uterus is within normal limits. Bilateral ovaries are within normal limits. Other: No abdominopelvic ascites. Musculoskeletal: Degenerative changes of the visualized thoracolumbar spine. IMPRESSION: Motion degraded images. 2 mm nonobstructing right lower pole renal calculus. Mild right hydronephrosis with indwelling right double pigtail ureteral stent. Left renal scarring/ atrophy. Additional ancillary findings as above. Electronically Signed   By: Julian Hy M.D.   On: 06/22/2016 19:17    Chart has been reviewed    Assessment/Plan  65 y.o. female with medical history significant of DM 2, CK D  Migraine headaches, history of kidney stone status post nephrolithotomy, being admitted for sepsis secondary to UTI/pyelonephritis  Present on Admission:  .  Recurrent UTI - appreciate urology consult patient had an a past history of Pseudomonas currently enterococcus we'll await results of urine culture and sensitivities to adjust antibiotic coverage . Sepsis ([place]) - Admit per Sepsis protocol likely source being  UTI   - rehydrated with 64ml/kg  - initiate broad spectrum antibiotics   Vancomycin and rocephin  -  obtain blood cultures  - Obtain serial lactic acid  - Obtain procalcitonin level  - Admit and monitor vital signs closely    Sepsis - Repeat Assessment  Performed at:    21;30  Vitals     Blood pressure (!) 113/53, pulse 98, temperature 99.3 F (37.4 C), temperature source Oral, resp. rate 20, height 4\' 8"  (1.422 m), weight 66.4 kg (146 lb 4.8 oz), SpO2 98 %.  Heart:     Regular rate and rhythm  Lungs:    CTA  Capillary Refill:   <2 sec  Peripheral Pulse:   Radial pulse palpable  Skin:     Normal Color  . HTN (hypertension) stable hold Norvasc given sepsis to monitor blood pressure . Type 2 diabetes mellitus with other diabetic kidney complication (HCC) sliding scale ordered  Nephrolithiasis minimal as per urology no need for instrumentation at this point Anemia will evaluate obtain anemia panel currently appears to be stable  Seizure disorder continue phenobarbital currently under good control Other plan as per orders.  DVT prophylaxis:    Lovenox     Code Status:  FULL CODE  as per patient   Family Communication:   Family   at  Bedside  plan of care was discussed with   Daughter Wilburn Cornelia [phone]  Disposition Plan:     To home once workup is complete and patient is stable                       Consults called: Urology Dr. Tresa Moore   Admission status:     inpatient       Level of care   tele             I have spent a total of 56 min on this admission  Jorene Kaylor 06/22/2016, 11:08 PM    [facility]  Pager [phone]   after 2 AM please page floor coverage PA If 7AM-7PM, please contact the day team taking care of the patient  [email or web address]  Password [email or web address]

## 2016-06-22 NOTE — Progress Notes (Addendum)
Pharmacy Antibiotic Note  Natalie [name] is a 65 y.o. female with PMH DM, sepsis d/t pyelonephritis in July, Right PCNL and stent placement in August, admitted on 06/22/2016 with UTI. Seen at [facility] clinic yesterday and given IM Rocephin x 1. Pharmacy initially consulted for cefepime dosing; however d/t enterococcus species growing on 9/21 UCx, will chg to empiric vancomycin per pharmacy.  Plan:  Vancomycin 1000 mg IV now, then 500 mg IV q24 hr; goal trough 10-15 mcg/mL for UTI  Measure vancomycin trough levels at steady state as indicated  Likely ampicillin sensitive, de-escalate as appropriate per sensitivities  F/u SCr, cultures, clinical course     Temp (24hrs), Avg:101.8 F (38.8 C), Min:100.5 F (38.1 C), Max:103 F (39.4 C)   Recent Labs Lab 06/21/16 1209 06/22/16 1425  WBC  --  16.9*  CREATININE 1.46*  --     Estimated Creatinine Clearance: 28.5 mL/min (by C-G formula based on SCr of 1.46 mg/dL (H)).    Allergies  Allergen Reactions  . Compazine     Bad headache, vomiting   . Cymbalta [Duloxetine Hcl]     unknown  . Escitalopram Oxalate Other (See Comments)    Can't sleep  . Methadone Hives  . Nitrofuran Derivatives     unknown    Antimicrobials this admission: 9/21 Rocephin x 1 9/22 Cefepime x 1 Vancomycin 9/22 >>   Dose adjustments this admission: ---  Microbiology results: 9/21 UCx (urgent care): > 100k enterococcus species 9/22 BCx: sent 9/22 UCx: sent   Thank you for allowing pharmacy to be a part of this patient's care.  Reuel Boom, PharmD, BCPS Pager: [phone] 06/22/2016, 4:33 PM

## 2016-06-23 DIAGNOSIS — R6510 Systemic inflammatory response syndrome (SIRS) of non-infectious origin without acute organ dysfunction: Secondary | ICD-10-CM

## 2016-06-23 DIAGNOSIS — I10 Essential (primary) hypertension: Secondary | ICD-10-CM

## 2016-06-23 DIAGNOSIS — D518 Other vitamin B12 deficiency anemias: Secondary | ICD-10-CM

## 2016-06-23 DIAGNOSIS — N10 Acute tubulo-interstitial nephritis: Secondary | ICD-10-CM

## 2016-06-23 DIAGNOSIS — G40909 Epilepsy, unspecified, not intractable, without status epilepticus: Secondary | ICD-10-CM

## 2016-06-23 DIAGNOSIS — D509 Iron deficiency anemia, unspecified: Secondary | ICD-10-CM

## 2016-06-23 DIAGNOSIS — E1129 Type 2 diabetes mellitus with other diabetic kidney complication: Secondary | ICD-10-CM

## 2016-06-23 LAB — CBC
HCT: 25.5 % — ABNORMAL LOW (ref 36.0–46.0)
HEMOGLOBIN: 7.7 g/dL — AB (ref 12.0–15.0)
MCH: 26.6 pg (ref 26.0–34.0)
MCHC: 30.2 g/dL (ref 30.0–36.0)
MCV: 87.9 fL (ref 78.0–100.0)
Platelets: 244 10*3/uL (ref 150–400)
RBC: 2.9 MIL/uL — AB (ref 3.87–5.11)
RDW: 14.8 % (ref 11.5–15.5)
WBC: 12.2 10*3/uL — ABNORMAL HIGH (ref 4.0–10.5)

## 2016-06-23 LAB — PROCALCITONIN: PROCALCITONIN: 1.46 ng/mL

## 2016-06-23 LAB — URINE CULTURE: Colony Count: >=100000

## 2016-06-23 LAB — GLUCOSE, CAPILLARY
GLUCOSE-CAPILLARY: 174 mg/dL — AB (ref 65–99)
GLUCOSE-CAPILLARY: 109 mg/dL — AB (ref 65–99)
Glucose-Capillary: 128 mg/dL — ABNORMAL HIGH (ref 65–99)
Glucose-Capillary: 198 mg/dL — ABNORMAL HIGH (ref 65–99)

## 2016-06-23 LAB — TSH: TSH: 1.742 u[IU]/mL (ref 0.350–4.500)

## 2016-06-23 LAB — COMPREHENSIVE METABOLIC PANEL
ALK PHOS: 44 U/L (ref 38–126)
ALT: 13 U/L — ABNORMAL LOW (ref 14–54)
ANION GAP: 7 (ref 5–15)
AST: 21 U/L (ref 15–41)
Albumin: 3.2 g/dL — ABNORMAL LOW (ref 3.5–5.0)
BUN: 19 mg/dL (ref 6–20)
CALCIUM: 8.5 mg/dL — AB (ref 8.9–10.3)
CO2: 20 mmol/L — AB (ref 22–32)
Chloride: 112 mmol/L — ABNORMAL HIGH (ref 101–111)
Creatinine, Ser: 1.28 mg/dL — ABNORMAL HIGH (ref 0.44–1.00)
GFR calc non Af Amer: 42 mL/min — ABNORMAL LOW (ref >60)
GFR, EST AFRICAN AMERICAN: 49 mL/min — AB (ref >60)
Glucose, Bld: 133 mg/dL — ABNORMAL HIGH (ref 65–99)
POTASSIUM: 4.5 mmol/L (ref 3.5–5.1)
SODIUM: 139 mmol/L (ref 135–145)
TOTAL PROTEIN: 6.6 g/dL (ref 6.5–8.1)
Total Bilirubin: 0.6 mg/dL (ref 0.3–1.2)

## 2016-06-23 LAB — FOLATE: FOLATE: 15.3 ng/mL (ref >5.9)

## 2016-06-23 LAB — IRON AND TIBC
Iron: 7 ug/dL — ABNORMAL LOW (ref 28–170)
SATURATION RATIOS: 2 % — AB (ref 10.4–31.8)
TIBC: 312 ug/dL (ref 250–450)
UIBC: 305 ug/dL

## 2016-06-23 LAB — RETICULOCYTES
RBC.: 2.9 MIL/uL — ABNORMAL LOW (ref 3.87–5.11)
Retic Count, Absolute: 40.6 10*3/uL (ref 19.0–186.0)
Retic Ct Pct: 1.4 % (ref 0.4–3.1)

## 2016-06-23 LAB — MAGNESIUM: MAGNESIUM: 1.8 mg/dL (ref 1.7–2.4)

## 2016-06-23 LAB — VITAMIN B12: VITAMIN B 12: 142 pg/mL — AB (ref 180–914)

## 2016-06-23 LAB — PHOSPHORUS: PHOSPHORUS: 3.2 mg/dL (ref 2.5–4.6)

## 2016-06-23 LAB — FERRITIN: Ferritin: 93 ng/mL (ref 11–307)

## 2016-06-23 MED ORDER — OXYCODONE HCL 5 MG PO TABS: 5.0000 mg | ORAL_TABLET | ORAL | Status: DC | PRN

## 2016-06-23 MED ORDER — MAGNESIUM SULFATE 2 GM/50ML IV SOLN
2.0000 g | Freq: Once | INTRAVENOUS | Status: AC
  Administered 2016-06-23: 2 g via INTRAVENOUS
  Filled 2016-06-23: qty 50

## 2016-06-23 MED ORDER — VANCOMYCIN HCL IN DEXTROSE 750-5 MG/150ML-% IV SOLN
750.0000 mg | INTRAVENOUS | Status: DC
  Administered 2016-06-23 – 2016-06-24 (×2): 750 mg via INTRAVENOUS
  Filled 2016-06-23 (×2): qty 150

## 2016-06-23 MED ORDER — ENOXAPARIN SODIUM 40 MG/0.4ML ~~LOC~~ SOLN
40.0000 mg | Freq: Every day | SUBCUTANEOUS | Status: DC
  Administered 2016-06-23: 40 mg via SUBCUTANEOUS
  Filled 2016-06-23: qty 0.4

## 2016-06-23 MED ORDER — DEXTROSE 5 % IV SOLN
1.0000 g | INTRAVENOUS | Status: DC
  Administered 2016-06-23 – 2016-06-24 (×2): 1 g via INTRAVENOUS
  Filled 2016-06-23 (×2): qty 1

## 2016-06-23 MED ORDER — SODIUM CHLORIDE 0.9 % IV SOLN
INTRAVENOUS | Status: AC
  Administered 2016-06-23: 10:00:00 via INTRAVENOUS

## 2016-06-23 MED ORDER — CYANOCOBALAMIN 1000 MCG/ML IJ SOLN
1000.0000 ug | Freq: Once | INTRAMUSCULAR | Status: AC
  Administered 2016-06-23: 1000 ug via INTRAMUSCULAR
  Filled 2016-06-23 (×2): qty 1

## 2016-06-23 MED ORDER — SODIUM CHLORIDE 0.9 % IV SOLN
510.0000 mg | Freq: Once | INTRAVENOUS | Status: AC
  Administered 2016-06-23: 510 mg via INTRAVENOUS
  Filled 2016-06-23: qty 17

## 2016-06-23 MED ORDER — DEXTROSE 5 % IV SOLN: 1.0000 g | INTRAVENOUS | Status: DC

## 2016-06-23 NOTE — Progress Notes (Signed)
Pharmacy Antibiotic Note  Natalie [name] is a 65 y.o. female with PMH DM, sepsis d/t pyelonephritis in July, Right PCNL and stent placement in August, admitted on 06/22/2016 with UTI. Seen at [facility] clinic yesterday and given IM Rocephin x 1. Pharmacy consulted for vancomycin, now adding cefepime with fever spike this AM (102.1).   Plan:  Cefepime 1g IV q24h.  Adjust Vancomycin to 750 mg q24h due to SCr improvement this morning; goal trough 10-15 mcg/mL for UTI  Measure vancomycin trough levels at steady state as indicated  F/u SCr, cultures, clinical course    Height: 4\' 8"  (142.2 cm) Weight: 146 lb 4.8 oz (66.4 kg) IBW/kg (Calculated) : 36.3  Temp (24hrs), Avg:101.6 F (38.7 C), Min:99.3 F (37.4 C), Max:103.2 F (39.6 C)   Recent Labs Lab 06/21/16 1209 06/22/16 1425 06/22/16 1544 06/22/16 1556 06/23/16 0552  WBC  --  16.9* 12.5*  --  12.2*  CREATININE 1.46*  --  1.47*  --  1.28*  LATICACIDVEN  --   --   --  0.78  --     Estimated Creatinine Clearance: 32.5 mL/min (by C-G formula based on SCr of 1.28 mg/dL (H)).    Allergies  Allergen Reactions  . Compazine     Bad headache, vomiting   . Cymbalta [Duloxetine Hcl]     unknown  . Escitalopram Oxalate Other (See Comments)    Can't sleep  . Methadone Hives  . Nitrofuran Derivatives     unknown    Antimicrobials this admission: 9/21 Rocephin x 1 9/22 Cefepime x 1 9/22 >> Vancomycin >> 9/23 >> Cefepime >>   Dose adjustments this admission: ---   Microbiology results: 9/21 UCx (urgent care): > 100k enterococcus species 9/22 BCx: sent 9/22 UCx: sent   Thank you for allowing pharmacy to be a part of this patient's care.  Hershal Coria, PharmD, BCPS Pager: [phone] 06/23/2016 7:52 AM

## 2016-06-23 NOTE — Progress Notes (Addendum)
PROGRESS NOTE  Natalie [name]  TDV:[id number] DOB: 1951-05-22 DOA: 06/22/2016 PCP: Wendie Agreste, MD  Brief Narrative:  Natalie [name] is a 65 y.o. female with medical history significant of DM 2, CKD, Migraine headaches, history of kidney stone status post nephrolithotomy, presented with back pain, vomiting, fever to 103.  In July she was admitted for pyelonephritis and sepsis due to Pseudomonas and Klebsiella with renal pelvis stone and severe right hydronephrosis for which she underwent percutaneous nephrolithotomy with stone removal and placement of a double-J stent by Dr. Diona Fanti.  Urine culture from two days prior to admission is growing Enterococcus.  On admission, she was febrile to 103.2.  WBC 12.5 and CT showed a 2 mm nonobstructing right lower pole renal calculus with mild right hydronephrosis.  She was started on vancomycin and cefepime pending additional culture data.  She continues to have high fevers but her heart rate has come down slightly.    Assessment & Plan:   Active Problems:   HTN (hypertension)   Seizure disorder (HCC)   Recurrent UTI   Abdominal pain   Type 2 diabetes mellitus with other diabetic kidney complication (HCC)   Sepsis (HCC)  Fever, SIRS criteria.  Does not meet criteria for sepsis.  (no AKI, altered mentation, etc).  Likely secondary to right pyelonephritis, but very unusual to have SIRS and such high fevers from enterococcal infection.  Will continue to treat broadly pending most recent blood and urine cultures. -  Continue vancomycin -  Resume cefepime -  Blood cultures so far no growth to date -  Repeat urine culture pending -  Follow-up sensitivities of enterococcal infection -  Appreciate urology assistance  Hypertension, blood pressure stable  -  continue to hold Norvasc  Diabetes mellitus type 2 with diabetic kidney complication, CBG stable -  Continue sliding scale insulin  CKD stage 3, creatinine near baseline -  Continue to trend  creatinine -  Renally dose medications and minimize contrast exposure  Seizure disorder, stable, continue phenobarbital  Iron deficiency and vitamin b12 deficiency anemia, hemoglobin trending down, likely hemodilution -  Check anti-IF and anti-parietal cell ab -  feraheme x 1 -  Start vitamin b12 injections  DVT prophylaxis:  Lovenox Code Status:  Full code Family Communication:  Patient alone Disposition Plan:  Pending improvement in fevers, white blood cell count trending down, more information about etiology of infection   Consultants:   Urology  Procedures:  None  Antimicrobials:  Anti-infectives    Start     Dose/Rate Route Frequency Ordered Stop   06/23/16 1600  ceFEPIme (MAXIPIME) 1 g in dextrose 5 % 50 mL IVPB  Status:  Discontinued     1 g 100 mL/hr over 30 Minutes Intravenous Every 24 hours 06/22/16 1556 06/22/16 1627   06/23/16 1600  vancomycin (VANCOCIN) 500 mg in sodium chloride 0.9 % 100 mL IVPB  Status:  Discontinued     500 mg 100 mL/hr over 60 Minutes Intravenous Every 24 hours 06/22/16 1628 06/23/16 0754   06/23/16 1600  cefTRIAXone (ROCEPHIN) 1 g in dextrose 5 % 50 mL IVPB  Status:  Discontinued     1 g 100 mL/hr over 30 Minutes Intravenous Every 24 hours 06/23/16 0011 06/23/16 0730   06/23/16 1600  ceFEPIme (MAXIPIME) 1 g in dextrose 5 % 50 mL IVPB     1 g 100 mL/hr over 30 Minutes Intravenous Every 24 hours 06/23/16 0754     06/23/16 1600  vancomycin (VANCOCIN) IVPB 750  mg/150 ml premix     750 mg 150 mL/hr over 60 Minutes Intravenous Every 24 hours 06/23/16 0754     06/22/16 1630  vancomycin (VANCOCIN) IVPB 1000 mg/200 mL premix     1,000 mg 200 mL/hr over 60 Minutes Intravenous  Once 06/22/16 1627 06/22/16 1750   06/22/16 1545  ceFEPIme (MAXIPIME) 2 g in dextrose 5 % 50 mL IVPB     2 g 100 mL/hr over 30 Minutes Intravenous  Once 06/22/16 1539 06/22/16 1644      Subjective:  Ongoing right flank pain. Intermittent fevers and chills. Denies  nausea and vomiting. Has not been eating well.  Objective: Vitals:   06/22/16 2200 06/22/16 2226 06/22/16 2239 06/23/16 0647  BP: (!) 113/54  (!) 113/53 138/61  Pulse: 102  98 (!) 103  Resp: 25  20 17   Temp:  101.5 F (38.6 C) 99.3 F (37.4 C) (!) 102.1 F (38.9 C)  TempSrc:  Axillary Oral Oral  SpO2: 95%  98% 97%  Weight:   66.4 kg (146 lb 4.8 oz)   Height:   4\' 8"  (1.422 m)     Intake/Output Summary (Last 24 hours) at 06/23/16 1400 Last data filed at 06/23/16 1035  Gross per 24 hour  Intake          6573.34 ml  Output                0 ml  Net          6573.34 ml   Filed Weights   06/22/16 1616 06/22/16 2239  Weight: 64.9 kg (143 lb) 66.4 kg (146 lb 4.8 oz)    Examination:  General exam:  Adult Female.  No acute distress.  HEENT:  NCAT, MMM Respiratory system: Clear to auscultation bilaterally Cardiovascular system: Regular rate and rhythm, normal S1/S2. No murmurs, rubs, gallops or clicks.  Warm extremities Gastrointestinal system: Normal active bowel sounds, soft, nondistended, nontender.  Very tender right flank pain MSK:  Normal tone and bulk, no lower extremity edema Neuro:  Grossly intact    Data Reviewed: I have personally reviewed following labs and imaging studies  CBC:  Recent Labs Lab 06/22/16 1425 06/22/16 1544 06/23/16 0552  WBC 16.9* 12.5* 12.2*  NEUTROABS  --  9.1*  --   HGB 9.2* 8.5* 7.7*  HCT 28.4* 27.1* 25.5*  MCV 82.8 85.0 87.9  PLT  --  303 440   Basic Metabolic Panel:  Recent Labs Lab 06/21/16 1209 06/22/16 1544 06/23/16 0552  NA 136 134* 139  K 5.0 3.7 4.5  CL 102 104 112*  CO2 22 19* 20*  GLUCOSE 156* 149* 133*  BUN 27* 23* 19  CREATININE 1.46* 1.47* 1.28*  CALCIUM 9.5 9.1 8.5*  MG  --   --  1.8  PHOS  --   --  3.2   GFR: Estimated Creatinine Clearance: 32.5 mL/min (by C-G formula based on SCr of 1.28 mg/dL (H)). Liver Function Tests:  Recent Labs Lab 06/21/16 1209 06/22/16 1544 06/23/16 0552  AST 12 15 21     ALT 8 13* 13*  ALKPHOS 60 50 44  BILITOT 0.2 0.5 0.6  PROT 6.6 7.4 6.6  ALBUMIN 4.0 3.8 3.2*   No results for input(s): LIPASE, AMYLASE in the last 168 hours. No results for input(s): AMMONIA in the last 168 hours. Coagulation Profile: No results for input(s): INR, PROTIME in the last 168 hours. Cardiac Enzymes: No results for input(s): CKTOTAL, CKMB, CKMBINDEX, TROPONINI in the last  168 hours. BNP (last 3 results) No results for input(s): PROBNP in the last 8760 hours. HbA1C: No results for input(s): HGBA1C in the last 72 hours. CBG:  Recent Labs Lab 06/22/16 2322 06/23/16 0737 06/23/16 1211  GLUCAP 180* 174* 109*   Lipid Profile: No results for input(s): CHOL, HDL, LDLCALC, TRIG, CHOLHDL, LDLDIRECT in the last 72 hours. Thyroid Function Tests:  Recent Labs  06/23/16 0552  TSH 1.742   Anemia Panel:  Recent Labs  06/23/16 0552  VITAMINB12 142*  FOLATE 15.3  FERRITIN 93  TIBC 312  IRON 7*  RETICCTPCT 1.4   Urine analysis:    Component Value Date/Time   COLORURINE YELLOW 06/22/2016 1539   APPEARANCEUR CLOUDY (A) 06/22/2016 1539   LABSPEC 1.006 06/22/2016 1539   PHURINE 6.0 06/22/2016 1539   GLUCOSEU NEGATIVE 06/22/2016 1539   HGBUR LARGE (A) 06/22/2016 1539   BILIRUBINUR NEGATIVE 06/22/2016 1539   BILIRUBINUR negative 06/21/2016 1205   BILIRUBINUR neg 11/30/2014 1834   KETONESUR NEGATIVE 06/22/2016 1539   PROTEINUR 30 (A) 06/22/2016 1539   UROBILINOGEN 0.2 06/21/2016 1205   UROBILINOGEN 0.2 08/31/2014 2056   NITRITE NEGATIVE 06/22/2016 1539   LEUKOCYTESUR LARGE (A) 06/22/2016 1539   Sepsis Labs: @LABRCNTIP (procalcitonin:4,lacticidven:4)  ) Recent Results (from the past 240 hour(s))  Urine culture     Status: None (Preliminary result)   Collection Time: 06/21/16 12:02 PM  Result Value Ref Range Status   Colony Count >=100,000 COLONIES/ML  Preliminary   Preliminary Report ENTEROCOCCUS SPECIES  Preliminary      Radiology Studies: Ct Renal  Stone Study  Result Date: 06/22/2016 CLINICAL DATA:  Flank pain, fever, history of stones EXAM: CT ABDOMEN AND PELVIS WITHOUT CONTRAST TECHNIQUE: Multidetector CT imaging of the abdomen and pelvis was performed following the standard protocol without IV contrast. COMPARISON:  04/11/2016 FINDINGS: Motion degraded images. Lower chest: Lung bases are clear. Hepatobiliary: Unenhanced liver is unremarkable. Status post cholecystectomy. No intrahepatic or extrahepatic ductal dilatation. Pancreas: Within normal limits. Spleen: Within normal limits. Adrenals/Urinary Tract: Adrenal glands are within normal limits. 10 mm hemorrhagic posterior right upper pole renal cyst (series 3/image 22). 2 mm nonobstructing right lower pole renal calculus (series 3/image 34). Mild right hydronephrosis with indwelling right double-pigtail ureteral stent. Left renal scarring/ atrophy.  No renal calculi or hydronephrosis. Bladder is within normal limits. Stomach/Bowel: Stomach is notable for a moderate hiatal hernia. No evidence of bowel obstruction. Appendix is not discretely visualized. Mild sigmoid diverticulosis, without evidence of diverticulitis Vascular/Lymphatic: No evidence of abdominal aortic aneurysm. Atherosclerotic calcifications of the abdominal aorta and branch vessels. No suspicious abdominopelvic lymphadenopathy. Reproductive: Uterus is within normal limits. Bilateral ovaries are within normal limits. Other: No abdominopelvic ascites. Musculoskeletal: Degenerative changes of the visualized thoracolumbar spine. IMPRESSION: Motion degraded images. 2 mm nonobstructing right lower pole renal calculus. Mild right hydronephrosis with indwelling right double pigtail ureteral stent. Left renal scarring/ atrophy. Additional ancillary findings as above. Electronically Signed   By: Julian Hy M.D.   On: 06/22/2016 19:17     Scheduled Meds: . atorvastatin  20 mg Oral Daily  . ceFEPime (MAXIPIME) IV  1 g Intravenous Q24H    . enoxaparin (LOVENOX) injection  40 mg Subcutaneous QHS  . insulin aspart  0-5 Units Subcutaneous QHS  . insulin aspart  0-9 Units Subcutaneous TID WC  . levothyroxine  75 mcg Oral QAC breakfast  . PHENobarbital  194.4 mg Oral Q M,W,F  . phenobarbital  97.2 mg Oral Once per day on Sun Tue  Thu Sat  . sodium chloride flush  3 mL Intravenous Q12H  . vancomycin  750 mg Intravenous Q24H   Continuous Infusions: . sodium chloride 100 mL/hr at 06/23/16 0959     LOS: 1 day    Time spent: 30 min    Lesly Joslyn, Noah Delaine, MD [facility] Pager [phone]  If 7PM-7AM, please contact night-coverage [email or web address] Password [email or web address] 06/23/2016, 2:00 PM

## 2016-06-23 NOTE — Progress Notes (Signed)
Pharmacy Antibiotic Note  Natalie [name] is a 65 y.o. female with PMH DM, sepsis d/t pyelonephritis in July, Right PCNL and stent placement in August, admitted on 06/22/2016 with UTI. Seen at [facility] clinic yesterday and given IM Rocephin x 1. Pharmacy initially consulted for cefepime dosing; however d/t enterococcus species growing on 9/21 UCx, will chg to empiric vancomycin per pharmacy.    Admission update:  Pharmacy also consulted to dose Ceftriaxone.  Patient received Cefepime 2gm IV x 1 @ 16:14 on 9/22.  Plan:  Ceftriaxone 1gm IV q24h  Vancomycin 1000 mg IV now, then 500 mg IV q24 hr; goal trough 10-15 mcg/mL for UTI  Measure vancomycin trough levels at steady state as indicated  Likely ampicillin sensitive, de-escalate as appropriate per sensitivities  F/u SCr, cultures, clinical course  Height: 4\' 8"  (142.2 cm) Weight: 146 lb 4.8 oz (66.4 kg) IBW/kg (Calculated) : 36.3  Temp (24hrs), Avg:101.5 F (38.6 C), Min:99.3 F (37.4 C), Max:103.2 F (39.6 C)   Recent Labs Lab 06/21/16 1209 06/22/16 1425 06/22/16 1544 06/22/16 1556  WBC  --  16.9* 12.5*  --   CREATININE 1.46*  --  1.47*  --   LATICACIDVEN  --   --   --  0.78    Estimated Creatinine Clearance: 28.3 mL/min (by C-G formula based on SCr of 1.47 mg/dL (H)).    Allergies  Allergen Reactions  . Compazine     Bad headache, vomiting   . Cymbalta [Duloxetine Hcl]     unknown  . Escitalopram Oxalate Other (See Comments)    Can't sleep  . Methadone Hives  . Nitrofuran Derivatives     unknown    Antimicrobials this admission: 9/21 Rocephin x 1 9/22 Cefepime x 1 9/22 Vancomycin >>  9/23 Ceftriaxone >>   Dose adjustments this admission: ---  Microbiology results: 9/21 UCx (urgent care): > 100k enterococcus species 9/22 BCx: sent 9/22 UCx: sent   Thank you for allowing pharmacy to be a part of this patient's care.  Leone Haven, PharmD 06/23/2016, 12:11 AM

## 2016-06-23 NOTE — Telephone Encounter (Signed)
Called patient, she states she is now in the hospital / told her to call for [place] when she is d/c for a follow up appt

## 2016-06-24 DIAGNOSIS — N12 Tubulo-interstitial nephritis, not specified as acute or chronic: Secondary | ICD-10-CM

## 2016-06-24 DIAGNOSIS — D518 Other vitamin B12 deficiency anemias: Secondary | ICD-10-CM

## 2016-06-24 LAB — BASIC METABOLIC PANEL
Anion gap: 7 (ref 5–15)
BUN: 17 mg/dL (ref 6–20)
CHLORIDE: 113 mmol/L — AB (ref 101–111)
CO2: 18 mmol/L — AB (ref 22–32)
CREATININE: 1.25 mg/dL — AB (ref 0.44–1.00)
Calcium: 8.5 mg/dL — ABNORMAL LOW (ref 8.9–10.3)
GFR calc Af Amer: 50 mL/min — ABNORMAL LOW (ref >60)
GFR calc non Af Amer: 43 mL/min — ABNORMAL LOW (ref >60)
GLUCOSE: 165 mg/dL — AB (ref 65–99)
Potassium: 4 mmol/L (ref 3.5–5.1)
Sodium: 138 mmol/L (ref 135–145)

## 2016-06-24 LAB — CBC
HEMATOCRIT: 22.6 % — AB (ref 36.0–46.0)
Hemoglobin: 6.9 g/dL — CL (ref 12.0–15.0)
MCH: 26.5 pg (ref 26.0–34.0)
MCHC: 30.5 g/dL (ref 30.0–36.0)
MCV: 86.9 fL (ref 78.0–100.0)
PLATELETS: 223 10*3/uL (ref 150–400)
RBC: 2.6 MIL/uL — ABNORMAL LOW (ref 3.87–5.11)
RDW: 14.8 % (ref 11.5–15.5)
WBC: 8.2 10*3/uL (ref 4.0–10.5)

## 2016-06-24 LAB — HEMOGLOBIN AND HEMATOCRIT, BLOOD
HCT: 26.6 % — ABNORMAL LOW (ref 36.0–46.0)
Hemoglobin: 8.3 g/dL — ABNORMAL LOW (ref 12.0–15.0)

## 2016-06-24 LAB — URINE CULTURE: Culture: <10000 — AB

## 2016-06-24 LAB — GLUCOSE, CAPILLARY
GLUCOSE-CAPILLARY: 155 mg/dL — AB (ref 65–99)
Glucose-Capillary: 159 mg/dL — ABNORMAL HIGH (ref 65–99)
Glucose-Capillary: 155 mg/dL — ABNORMAL HIGH (ref 65–99)
Glucose-Capillary: 165 mg/dL — ABNORMAL HIGH (ref 65–99)

## 2016-06-24 LAB — LACTATE DEHYDROGENASE: LDH: 135 U/L (ref 98–192)

## 2016-06-24 LAB — PREPARE RBC (CROSSMATCH)

## 2016-06-24 LAB — HEMOGLOBIN A1C
HEMOGLOBIN A1C: 8.6 % — AB (ref 4.8–5.6)
MEAN PLASMA GLUCOSE: 200 mg/dL

## 2016-06-24 LAB — OCCULT BLOOD X 1 CARD TO LAB, STOOL: FECAL OCCULT BLD: POSITIVE — AB

## 2016-06-24 MED ORDER — SODIUM CHLORIDE 0.9 % IV SOLN
Freq: Once | INTRAVENOUS | Status: AC
  Administered 2016-06-24: 11:00:00 via INTRAVENOUS

## 2016-06-24 MED ORDER — PANTOPRAZOLE SODIUM 40 MG PO TBEC
40.0000 mg | DELAYED_RELEASE_TABLET | Freq: Two times a day (BID) | ORAL | Status: DC
  Administered 2016-06-25: 40 mg via ORAL
  Filled 2016-06-24: qty 1

## 2016-06-24 NOTE — Progress Notes (Signed)
CRITICAL VALUE ALERT  Critical value received:  Hemoglobin 6.9  Date of notification:  06/24/16  Time of notification:  0609  Critical value read back:yes  Nurse who received alert: Mechele Claude  MD notified (1st page): yes  Time of first page: [phone]

## 2016-06-24 NOTE — Progress Notes (Signed)
PROGRESS NOTE  Natalie [name]  WER:[id number] DOB: May 22, 1951 DOA: 06/22/2016 PCP: Wendie Agreste, MD  Brief Narrative:  Natalie [name] is a 65 y.o. female with medical history significant of DM 2, CKD, Migraine headaches, history of kidney stone status post nephrolithotomy, presented with back pain, vomiting, fever to 103.  In July she was admitted for pyelonephritis and sepsis due to Pseudomonas and Klebsiella with renal pelvis stone and severe right hydronephrosis for which she underwent percutaneous nephrolithotomy with stone removal and placement of a double-J stent by Dr. Diona Fanti.  Urine culture from two days prior to admission is growing Enterococcus.  On admission, she was febrile to 103.2.  WBC 12.5 and CT showed a 2 mm nonobstructing right lower pole renal calculus with mild right hydronephrosis.  She was started on vancomycin and cefepime pending additional culture data.  She continues to have high fevers but her heart rate has come down slightly.  Worsening anemia requiring blood transfusion on 9/24.    Assessment & Plan:   Principal Problem:   SIRS (systemic inflammatory response syndrome) (HCC) Active Problems:   HTN (hypertension)   Seizure disorder (HCC)   Pyelonephritis   Abdominal pain   Type 2 diabetes mellitus with other diabetic kidney complication (HCC)   Anemia, iron deficiency   Vitamin B12 deficiency (dietary) anemia  Fever, SIRS criteria.  Does not meet criteria for sepsis.  (no AKI, altered mentation, etc).  Likely secondary to right pyelonephritis, but very unusual to have SIRS and such high fevers from enterococcal infection.  Will continue to treat broadly pending most recent blood and urine cultures. -  Continue vancomycin -  Continue cefepime -  Blood cultures so far no growth to date -  Repeat urine culture < 10 000 colonies -  Follow-up sensitivities of enterococcal infection -  Appreciate urology assistance  Hypertension, blood pressure stable  -   continue to hold Norvasc  Diabetes mellitus type 2 with diabetic kidney complication, CBG stable -  Continue sliding scale insulin  CKD stage 3, creatinine near baseline -  Continue to trend creatinine -  Renally dose medications and minimize contrast exposure  Seizure disorder, stable, continue phenobarbital  Iron deficiency and vitamin b12 deficiency anemia, hemoglobin trending down, likely hemodilution.  retic low -  anti-IF and anti-parietal cell ab pending -  feraheme x 1 -  Start vitamin b12 injections -  LDH wnl (not hemolysis) -  Occult stool -  Transfuse 1 unit   DVT prophylaxis:  Lovenox Code Status:  Full code Family Communication:  Patient alone Disposition Plan:  Pending stable hemoglobin, on oral antibiotics, possibly tomorrow  Consultants:   Urology  Procedures:  None  Antimicrobials:  Anti-infectives    Start     Dose/Rate Route Frequency Ordered Stop   06/23/16 1600  ceFEPIme (MAXIPIME) 1 g in dextrose 5 % 50 mL IVPB  Status:  Discontinued     1 g 100 mL/hr over 30 Minutes Intravenous Every 24 hours 06/22/16 1556 06/22/16 1627   06/23/16 1600  vancomycin (VANCOCIN) 500 mg in sodium chloride 0.9 % 100 mL IVPB  Status:  Discontinued     500 mg 100 mL/hr over 60 Minutes Intravenous Every 24 hours 06/22/16 1628 06/23/16 0754   06/23/16 1600  cefTRIAXone (ROCEPHIN) 1 g in dextrose 5 % 50 mL IVPB  Status:  Discontinued     1 g 100 mL/hr over 30 Minutes Intravenous Every 24 hours 06/23/16 0011 06/23/16 0730   06/23/16 1600  ceFEPIme (  MAXIPIME) 1 g in dextrose 5 % 50 mL IVPB     1 g 100 mL/hr over 30 Minutes Intravenous Every 24 hours 06/23/16 0754     06/23/16 1600  vancomycin (VANCOCIN) IVPB 750 mg/150 ml premix     750 mg 150 mL/hr over 60 Minutes Intravenous Every 24 hours 06/23/16 0754     06/22/16 1630  vancomycin (VANCOCIN) IVPB 1000 mg/200 mL premix     1,000 mg 200 mL/hr over 60 Minutes Intravenous  Once 06/22/16 1627 06/22/16 1750   06/22/16 1545   ceFEPIme (MAXIPIME) 2 g in dextrose 5 % 50 mL IVPB     2 g 100 mL/hr over 30 Minutes Intravenous  Once 06/22/16 1539 06/22/16 1644      Subjective:  Markedly improved right flank pain and totally resolved left flank pain. Fevers and chills also improving. Denies nausea and vomiting. Eating better.  Last BM was more formed.  Objective: Vitals:   06/23/16 2118 06/24/16 0621 06/24/16 1110 06/24/16 1145  BP: 120/68 (!) 128/55 129/68 (!) 126/56  Pulse: 83 88 86 89  Resp: 15 16 16 16   Temp: 98.9 F (37.2 C) 98.8 F (37.1 C) 99.3 F (37.4 C) 99.2 F (37.3 C)  TempSrc: Oral Oral Oral Oral  SpO2: 95% 94% 94% 93%  Weight:      Height:        Intake/Output Summary (Last 24 hours) at 06/24/16 1216 Last data filed at 06/23/16 1907  Gross per 24 hour  Intake          1001.67 ml  Output                0 ml  Net          1001.67 ml   Filed Weights   06/22/16 1616 06/22/16 2239  Weight: 64.9 kg (143 lb) 66.4 kg (146 lb 4.8 oz)    Examination:  General exam:  Adult Female.  No acute distress.  HEENT:  NCAT, MMM Respiratory system: Clear to auscultation bilaterally Cardiovascular system: Regular rate and rhythm, normal S1/S2. No murmurs, rubs, gallops or clicks.  Warm extremities Gastrointestinal system: Normal active bowel sounds, soft, nondistended, nontender.  Mild right flank pain MSK:  Normal tone and bulk, no lower extremity edema Neuro:  Grossly intact    Data Reviewed: I have personally reviewed following labs and imaging studies  CBC:  Recent Labs Lab 06/22/16 1425 06/22/16 1544 06/23/16 0552 06/24/16 0449  WBC 16.9* 12.5* 12.2* 8.2  NEUTROABS  --  9.1*  --   --   HGB 9.2* 8.5* 7.7* 6.9*  HCT 28.4* 27.1* 25.5* 22.6*  MCV 82.8 85.0 87.9 86.9  PLT  --  303 244 932   Basic Metabolic Panel:  Recent Labs Lab 06/21/16 1209 06/22/16 1544 06/23/16 0552 06/24/16 0449  NA 136 134* 139 138  K 5.0 3.7 4.5 4.0  CL 102 104 112* 113*  CO2 22 19* 20* 18*    GLUCOSE 156* 149* 133* 165*  BUN 27* 23* 19 17  CREATININE 1.46* 1.47* 1.28* 1.25*  CALCIUM 9.5 9.1 8.5* 8.5*  MG  --   --  1.8  --   PHOS  --   --  3.2  --    GFR: Estimated Creatinine Clearance: 33.3 mL/min (by C-G formula based on SCr of 1.25 mg/dL (H)). Liver Function Tests:  Recent Labs Lab 06/21/16 1209 06/22/16 1544 06/23/16 0552  AST 12 15 21   ALT 8 13* 13*  ALKPHOS  60 50 44  BILITOT 0.2 0.5 0.6  PROT 6.6 7.4 6.6  ALBUMIN 4.0 3.8 3.2*   No results for input(s): LIPASE, AMYLASE in the last 168 hours. No results for input(s): AMMONIA in the last 168 hours. Coagulation Profile: No results for input(s): INR, PROTIME in the last 168 hours. Cardiac Enzymes: No results for input(s): CKTOTAL, CKMB, CKMBINDEX, TROPONINI in the last 168 hours. BNP (last 3 results) No results for input(s): PROBNP in the last 8760 hours. HbA1C:  Recent Labs  06/23/16 0552  HGBA1C 8.6*   CBG:  Recent Labs Lab 06/23/16 1211 06/23/16 1649 06/23/16 2122 06/24/16 0736 06/24/16 1206  GLUCAP 109* 128* 198* 159* 155*   Lipid Profile: No results for input(s): CHOL, HDL, LDLCALC, TRIG, CHOLHDL, LDLDIRECT in the last 72 hours. Thyroid Function Tests:  Recent Labs  06/23/16 0552  TSH 1.742   Anemia Panel:  Recent Labs  06/23/16 0552  VITAMINB12 142*  FOLATE 15.3  FERRITIN 93  TIBC 312  IRON 7*  RETICCTPCT 1.4   Urine analysis:    Component Value Date/Time   COLORURINE YELLOW 06/22/2016 1539   APPEARANCEUR CLOUDY (A) 06/22/2016 1539   LABSPEC 1.006 06/22/2016 1539   PHURINE 6.0 06/22/2016 1539   GLUCOSEU NEGATIVE 06/22/2016 1539   HGBUR LARGE (A) 06/22/2016 1539   BILIRUBINUR NEGATIVE 06/22/2016 1539   BILIRUBINUR negative 06/21/2016 1205   BILIRUBINUR neg 11/30/2014 1834   KETONESUR NEGATIVE 06/22/2016 1539   PROTEINUR 30 (A) 06/22/2016 1539   UROBILINOGEN 0.2 06/21/2016 1205   UROBILINOGEN 0.2 08/31/2014 2056   NITRITE NEGATIVE 06/22/2016 1539   LEUKOCYTESUR  LARGE (A) 06/22/2016 1539   Sepsis Labs: @LABRCNTIP (procalcitonin:4,lacticidven:4)  ) Recent Results (from the past 240 hour(s))  Urine culture     Status: None   Collection Time: 06/21/16 12:02 PM  Result Value Ref Range Status   Culture ENTEROCOCCUS SPECIES  Final   Colony Count >=100,000 COLONIES/ML  Final   Organism ID, Bacteria ENTEROCOCCUS SPECIES  Final      Susceptibility   Enterococcus species -  (no method available)    AMPICILLIN <=2 Sensitive     LEVOFLOXACIN 1 Sensitive     NITROFURANTOIN <=16 Sensitive     VANCOMYCIN 1 Sensitive     TETRACYCLINE <=1 Sensitive   Urine culture     Status: Abnormal   Collection Time: 06/22/16  3:39 PM  Result Value Ref Range Status   Specimen Description URINE, CLEAN CATCH  Final   Special Requests NONE  Final   Culture (A)  Final    <10,000 COLONIES/mL INSIGNIFICANT GROWTH Performed at [facility]    Report Status 06/24/2016 FINAL  Final  Blood Culture (routine x 2)     Status: None (Preliminary result)   Collection Time: 06/22/16  3:44 PM  Result Value Ref Range Status   Specimen Description BLOOD LEFT HAND  Final   Special Requests BOTTLES DRAWN AEROBIC AND ANAEROBIC 5ML  Final   Culture NO GROWTH 1 DAY  Final   Report Status PENDING  Incomplete  Blood Culture (routine x 2)     Status: None (Preliminary result)   Collection Time: 06/22/16  4:13 PM  Result Value Ref Range Status   Specimen Description BLOOD RIGHT HAND  Final   Special Requests BOTTLES DRAWN AEROBIC ONLY 4CC  Final   Culture NO GROWTH < 24 HOURS  Final   Report Status PENDING  Incomplete      Radiology Studies: Ct Renal Stone Study  Result  Date: 06/22/2016 CLINICAL DATA:  Flank pain, fever, history of stones EXAM: CT ABDOMEN AND PELVIS WITHOUT CONTRAST TECHNIQUE: Multidetector CT imaging of the abdomen and pelvis was performed following the standard protocol without IV contrast. COMPARISON:  04/11/2016 FINDINGS: Motion degraded images. Lower  chest: Lung bases are clear. Hepatobiliary: Unenhanced liver is unremarkable. Status post cholecystectomy. No intrahepatic or extrahepatic ductal dilatation. Pancreas: Within normal limits. Spleen: Within normal limits. Adrenals/Urinary Tract: Adrenal glands are within normal limits. 10 mm hemorrhagic posterior right upper pole renal cyst (series 3/image 22). 2 mm nonobstructing right lower pole renal calculus (series 3/image 34). Mild right hydronephrosis with indwelling right double-pigtail ureteral stent. Left renal scarring/ atrophy.  No renal calculi or hydronephrosis. Bladder is within normal limits. Stomach/Bowel: Stomach is notable for a moderate hiatal hernia. No evidence of bowel obstruction. Appendix is not discretely visualized. Mild sigmoid diverticulosis, without evidence of diverticulitis Vascular/Lymphatic: No evidence of abdominal aortic aneurysm. Atherosclerotic calcifications of the abdominal aorta and branch vessels. No suspicious abdominopelvic lymphadenopathy. Reproductive: Uterus is within normal limits. Bilateral ovaries are within normal limits. Other: No abdominopelvic ascites. Musculoskeletal: Degenerative changes of the visualized thoracolumbar spine. IMPRESSION: Motion degraded images. 2 mm nonobstructing right lower pole renal calculus. Mild right hydronephrosis with indwelling right double pigtail ureteral stent. Left renal scarring/ atrophy. Additional ancillary findings as above. Electronically Signed   By: Julian Hy M.D.   On: 06/22/2016 19:17     Scheduled Meds: . atorvastatin  20 mg Oral Daily  . ceFEPime (MAXIPIME) IV  1 g Intravenous Q24H  . enoxaparin (LOVENOX) injection  40 mg Subcutaneous QHS  . insulin aspart  0-5 Units Subcutaneous QHS  . insulin aspart  0-9 Units Subcutaneous TID WC  . levothyroxine  75 mcg Oral QAC breakfast  . PHENobarbital  194.4 mg Oral Q M,W,F  . phenobarbital  97.2 mg Oral Once per day on Sun Tue Thu Sat  . sodium chloride flush   3 mL Intravenous Q12H  . vancomycin  750 mg Intravenous Q24H   Continuous Infusions:     LOS: 2 days    Time spent: 30 min    Janece Canterbury, MD [facility] Pager [phone]  If 7PM-7AM, please contact night-coverage [email or web address] Password [facility] 06/24/2016, 12:16 PM

## 2016-06-25 DIAGNOSIS — D509 Iron deficiency anemia, unspecified: Secondary | ICD-10-CM

## 2016-06-25 LAB — BASIC METABOLIC PANEL
ANION GAP: 6 (ref 5–15)
BUN: 13 mg/dL (ref 6–20)
CALCIUM: 9.5 mg/dL (ref 8.9–10.3)
CO2: 21 mmol/L — ABNORMAL LOW (ref 22–32)
CREATININE: 1.07 mg/dL — AB (ref 0.44–1.00)
Chloride: 114 mmol/L — ABNORMAL HIGH (ref 101–111)
GFR, EST NON AFRICAN AMERICAN: 52 mL/min — AB (ref >60)
Glucose, Bld: 146 mg/dL — ABNORMAL HIGH (ref 65–99)
Potassium: 3.8 mmol/L (ref 3.5–5.1)
Sodium: 141 mmol/L (ref 135–145)

## 2016-06-25 LAB — TYPE AND SCREEN
ABO/RH(D): A POS
Antibody Screen: NEGATIVE
UNIT DIVISION: 0

## 2016-06-25 LAB — INTRINSIC FACTOR ANTIBODIES: Intrinsic Factor: 1.1 AU/mL (ref 0.0–1.1)

## 2016-06-25 LAB — GLUCOSE, CAPILLARY
GLUCOSE-CAPILLARY: 172 mg/dL — AB (ref 65–99)
GLUCOSE-CAPILLARY: 194 mg/dL — AB (ref 65–99)

## 2016-06-25 LAB — CBC
HCT: 27.7 % — ABNORMAL LOW (ref 36.0–46.0)
HEMOGLOBIN: 8.6 g/dL — AB (ref 12.0–15.0)
MCH: 26.6 pg (ref 26.0–34.0)
MCHC: 31 g/dL (ref 30.0–36.0)
MCV: 85.8 fL (ref 78.0–100.0)
PLATELETS: 264 10*3/uL (ref 150–400)
RBC: 3.23 MIL/uL — AB (ref 3.87–5.11)
RDW: 14.5 % (ref 11.5–15.5)
WBC: 7.1 10*3/uL (ref 4.0–10.5)

## 2016-06-25 LAB — ANTI-PARIETAL ANTIBODY: Parietal Cell Antibody-IgG: 3.8 Units (ref 0.0–20.0)

## 2016-06-25 LAB — HAPTOGLOBIN: HAPTOGLOBIN: 365 mg/dL — AB (ref 34–200)

## 2016-06-25 MED ORDER — AMOXICILLIN-POT CLAVULANATE 875-125 MG PO TABS
1.0000 | ORAL_TABLET | Freq: Two times a day (BID) | ORAL | Status: DC
  Administered 2016-06-25: 1 via ORAL
  Filled 2016-06-25: qty 1

## 2016-06-25 MED ORDER — FERROUS SULFATE 325 (65 FE) MG PO TABS: 325.0000 mg | ORAL_TABLET | Freq: Two times a day (BID) | ORAL | 0 refills | Status: DC

## 2016-06-25 MED ORDER — OMEPRAZOLE 20 MG PO CPDR: 20.0000 mg | DELAYED_RELEASE_CAPSULE | Freq: Every day | ORAL | 0 refills | Status: DC

## 2016-06-25 MED ORDER — CYANOCOBALAMIN 1000 MCG PO TABS: 1000.0000 ug | ORAL_TABLET | Freq: Every day | ORAL | 0 refills | Status: DC

## 2016-06-25 MED ORDER — FERROUS SULFATE 325 (65 FE) MG PO TABS
325.0000 mg | ORAL_TABLET | Freq: Two times a day (BID) | ORAL | Status: DC
  Administered 2016-06-25: 325 mg via ORAL
  Filled 2016-06-25: qty 1

## 2016-06-25 MED ORDER — VITAMIN B-12 1000 MCG PO TABS: 1000.0000 ug | ORAL_TABLET | Freq: Every day | ORAL | Status: DC

## 2016-06-25 MED ORDER — CYANOCOBALAMIN 1000 MCG/ML IJ SOLN
1000.0000 ug | Freq: Once | INTRAMUSCULAR | Status: AC
  Administered 2016-06-25: 1000 ug via INTRAMUSCULAR
  Filled 2016-06-25: qty 1

## 2016-06-25 MED ORDER — AMOXICILLIN-POT CLAVULANATE 875-125 MG PO TABS: 1.0000 | ORAL_TABLET | Freq: Two times a day (BID) | ORAL | 0 refills | Status: DC

## 2016-06-25 NOTE — Discharge Summary (Signed)
Physician Discharge Summary  Natalie [name] QQP:[id number] DOB: 1951-08-14 DOA: 06/22/2016  PCP: Wendie Agreste, MD  Admit date: 06/22/2016 Discharge date: 06/25/2016  Admitted From: home  Disposition:  home  Recommendations for Outpatient Follow-up:  1. Follow up with PCP in 1-2 weeks.  Referral to GI for iron deficiency anemia with occult positive stool.   2. Vitamin b12 injections 3. Please obtain BMP/CBC in one week 4. Please follow up on the following pending results:  Anti-IF pending 5. Rx for augmentin to continue until follow up with Urology 6. Urology follow up in 1-2 weeks  Home Health:  none  Equipment/Devices:  None  Discharge Condition:  Stable, improved CODE STATUS:  full  Diet recommendation:  diabetic   Brief/Interim Summary:  Natalie [name] a 65 y.o.femalewith medical history significant of DM 2, CKD, Migraine headaches, history of kidney stone status post nephrolithotomy, presented with back pain, vomiting, fever to 103.  In July she was admitted for pyelonephritis and sepsis due to Pseudomonas and Klebsiella with renal pelvis stone and severe right hydronephrosis for which she underwent percutaneous nephrolithotomy with stone removal and placement of a double-J stent by Dr. Diona Fanti.  Urine culture from two days prior to admission grew Enterococcus.  On admission, she was febrile to 103.2.  WBC 12.5 and CT showed a 2 mm nonobstructing right lower pole renal calculus with mild right hydronephrosis.  She was started on vancomycin and cefepime pending additional culture data.  Blood cultures remained negative and urine culture grew < 10 000 colonies.  Fevers and SIRS physiology started to improve after the first 24-48 hours.  Will complete a 7-10 day course for sepsis due to enterococcus, transitioning to augmentin at discharge.  Worsening anemia required blood transfusion on 9/24.  She was iron and vitamin B12 deficient and her stool, although brown, was occult positive.   She was given an iron infusion and started on oral iron.  She was also given two injections of vitamin B12 and started on oral vitamin B12 at discharge.  In case she has some gastritis or PUD, I started her on a PPI.  Her PCP can refer her to a gastroenterologist as an outpatient.    Discharge Diagnoses:  Principal Problem:   SIRS (systemic inflammatory response syndrome) (HCC) Active Problems:   HTN (hypertension)   Seizure disorder (HCC)   Pyelonephritis   Abdominal pain   Type 2 diabetes mellitus with other diabetic kidney complication (HCC)   Anemia, iron deficiency   Vitamin B12 deficiency (dietary) anemia   Fever, SIRS criteria.  Does not meet criteria for sepsis.  (no AKI, altered mentation, etc).  Likely secondary to right pyelonephritis -  Vancomycin and cefepime during hospitalization -  Augmentin at discharge based on culture data -  Blood cultures so far no growth to date -  Repeat urine culture < 10 000 colonies -  Appreciated urology assistance > follow up in a couple of weeks -  Continued catheter at discharge  Hypertension, blood pressure gradually improved -  initially held Norvasc, but resumed at discharge  Diabetes mellitus type 2 with diabetic kidney complication, CBG stable -  Given sliding scale insulin -  Resumed SU at discharge  CKD stage 3, creatinine remained near baseline  Seizure disorder, stable, continue phenobarbital  Iron deficiency and vitamin b12 deficiency anemia, hemoglobin trended down, likely hemodilutional and marrow suppression from acute illness.  retic low suggestive lack of marrow response.  No evidence of DIC or hemolysis.   -  anti-IF pending and anti-parietal cell ab negative -  feraheme x 1 on 9/23 -  Started vitamin b12 injections > can be continued by PCP -  LDH wnl (not hemolysis) -  Occult stool POSITIVE.  No gross blood in stools -  Transfused 1 unit with excellent hemoglobin response   Discharge  Instructions  Discharge Instructions    Call MD for:  difficulty breathing, headache or visual disturbances    Complete by:  As directed    Call MD for:  extreme fatigue    Complete by:  As directed    Call MD for:  hives    Complete by:  As directed    Call MD for:  persistant dizziness or light-headedness    Complete by:  As directed    Call MD for:  persistant nausea and vomiting    Complete by:  As directed    Call MD for:  severe uncontrolled pain    Complete by:  As directed    Call MD for:  temperature >100.4    Complete by:  As directed    Diet Carb Modified    Complete by:  As directed    Increase activity slowly    Complete by:  As directed        Medication List    TAKE these medications   amLODipine 5 MG tablet Commonly known as:  NORVASC Take 1 tablet (5 mg total) by mouth daily.   amoxicillin-clavulanate 875-125 MG tablet Commonly known as:  AUGMENTIN Take 1 tablet by mouth every 12 (twelve) hours.   atorvastatin 20 MG tablet Commonly known as:  LIPITOR Take 1 tablet (20 mg total) by mouth daily.   butorphanol 10 MG/ML nasal spray Commonly known as:  STADOL Place 1 spray into the nose every 4 (four) hours as needed for migraine. May refill weekly.   cyanocobalamin 1000 MCG tablet Take 1 tablet (1,000 mcg total) by mouth daily. Start taking on:  06/26/2016   ferrous sulfate 325 (65 FE) MG tablet Take 1 tablet (325 mg total) by mouth 2 (two) times daily with a meal.   glipiZIDE 5 MG tablet Commonly known as:  GLUCOTROL Take 1 tablet (5 mg total) by mouth 2 (two) times daily before a meal.   lactobacillus acidophilus Tabs tablet Take 2 tablets by mouth 3 (three) times daily.   levothyroxine 75 MCG tablet Commonly known as:  SYNTHROID, LEVOTHROID Take 1 tablet (75 mcg total) by mouth daily.   omeprazole 20 MG capsule Commonly known as:  PRILOSEC Take 1 capsule (20 mg total) by mouth daily.   ondansetron 8 MG disintegrating tablet Commonly  known as:  ZOFRAN-ODT DISSOLVE 1 TABLET ON OR UNDER THE TONGUE EVERY 8 HOURS AS NEEDED FOR NAUSEA   PHENobarbital 97.2 MG tablet Commonly known as:  LUMINAL Take 1-2 tablets (97.2-194.4 mg total) by mouth daily. TAKE TWO TABLETS BY MOUTH ONCE DAILY ON  MON,  WED,  AND  FRI  AND  1  TABLET  DAILY  ON  OTHER  DAYS   polycarbophil 625 MG tablet Commonly known as:  FIBERCON Take 625 mg by mouth daily.   zolpidem 5 MG tablet Commonly known as:  AMBIEN take 1 tablet by mouth at bedtime for sleep if needed. May repeat x 1 in 1 hr if needed      Follow-up Information    GREENE,JEFFREY R, MD. Schedule an appointment as soon as possible for a visit in 1 week(s).   Specialties:  Family Medicine, Sports Medicine Why:  follow up bloodwork and referralt to gastroenterology Contact information: [place] [address] [phone]        Jorja Loa, MD Follow up in 2 week(s).   Specialty:  Urology Contact information: [address] [place] [address] [phone]          Allergies  Allergen Reactions  . Compazine     Bad headache, vomiting   . Cymbalta [Duloxetine Hcl]     unknown  . Escitalopram Oxalate Other (See Comments)    Can't sleep  . Methadone Hives  . Nitrofuran Derivatives     unknown    Consultations: Urology, Dr. Tresa Moore   Procedures/Studies: Ct Renal Stone Study  Result Date: 06/22/2016 CLINICAL DATA:  Flank pain, fever, history of stones EXAM: CT ABDOMEN AND PELVIS WITHOUT CONTRAST TECHNIQUE: Multidetector CT imaging of the abdomen and pelvis was performed following the standard protocol without IV contrast. COMPARISON:  04/11/2016 FINDINGS: Motion degraded images. Lower chest: Lung bases are clear. Hepatobiliary: Unenhanced liver is unremarkable. Status post cholecystectomy. No intrahepatic or extrahepatic ductal dilatation. Pancreas: Within normal limits. Spleen: Within normal limits. Adrenals/Urinary Tract: Adrenal glands are  within normal limits. 10 mm hemorrhagic posterior right upper pole renal cyst (series 3/image 22). 2 mm nonobstructing right lower pole renal calculus (series 3/image 34). Mild right hydronephrosis with indwelling right double-pigtail ureteral stent. Left renal scarring/ atrophy.  No renal calculi or hydronephrosis. Bladder is within normal limits. Stomach/Bowel: Stomach is notable for a moderate hiatal hernia. No evidence of bowel obstruction. Appendix is not discretely visualized. Mild sigmoid diverticulosis, without evidence of diverticulitis Vascular/Lymphatic: No evidence of abdominal aortic aneurysm. Atherosclerotic calcifications of the abdominal aorta and branch vessels. No suspicious abdominopelvic lymphadenopathy. Reproductive: Uterus is within normal limits. Bilateral ovaries are within normal limits. Other: No abdominopelvic ascites. Musculoskeletal: Degenerative changes of the visualized thoracolumbar spine. IMPRESSION: Motion degraded images. 2 mm nonobstructing right lower pole renal calculus. Mild right hydronephrosis with indwelling right double pigtail ureteral stent. Left renal scarring/ atrophy. Additional ancillary findings as above. Electronically Signed   By: Julian Hy M.D.   On: 06/22/2016 19:17   Subjective: Feeling much better today, decreasing pain in right flank, and would like to go home today.  Eating and drinking well.    Discharge Exam: Vitals:   06/24/16 2136 06/25/16 0535  BP: (!) 149/78 (!) 150/72  Pulse: 93 87  Resp: 18 16  Temp: 99 F (37.2 C) 98.8 F (37.1 C)   Vitals:   06/24/16 1145 06/24/16 1350 06/24/16 2136 06/25/16 0535  BP: (!) 126/56 (!) 149/64 (!) 149/78 (!) 150/72  Pulse: 89 88 93 87  Resp: 16 18 18 16   Temp: 99.2 F (37.3 C) 98.1 F (36.7 C) 99 F (37.2 C) 98.8 F (37.1 C)  TempSrc: Oral Axillary Oral Oral  SpO2: 93% 95% 95% 99%  Weight:      Height:        General exam:  Adult Female.  No acute distress.  HEENT:  NCAT,  MMM Respiratory system: Clear to auscultation bilaterally Cardiovascular system: Regular rate and rhythm, normal S1/S2. No murmurs, rubs, gallops or clicks.  Warm extremities Gastrointestinal system: Normal active bowel sounds, soft, nondistended, nontender.  Mild right flank pain MSK:  Normal tone and bulk, no lower extremity edema Neuro:  Grossly intact    The results of significant diagnostics from this hospitalization (including imaging, microbiology, ancillary and laboratory) are listed below for reference.  Microbiology: Recent Results (from the past 240 hour(s))  Urine culture     Status: None   Collection Time: 06/21/16 12:02 PM  Result Value Ref Range Status   Culture ENTEROCOCCUS SPECIES  Final   Colony Count >=100,000 COLONIES/ML  Final   Organism ID, Bacteria ENTEROCOCCUS SPECIES  Final      Susceptibility   Enterococcus species -  (no method available)    AMPICILLIN <=2 Sensitive     LEVOFLOXACIN 1 Sensitive     NITROFURANTOIN <=16 Sensitive     VANCOMYCIN 1 Sensitive     TETRACYCLINE <=1 Sensitive   Urine culture     Status: Abnormal   Collection Time: 06/22/16  3:39 PM  Result Value Ref Range Status   Specimen Description URINE, CLEAN CATCH  Final   Special Requests NONE  Final   Culture (A)  Final    <10,000 COLONIES/mL INSIGNIFICANT GROWTH Performed at [facility]    Report Status 06/24/2016 FINAL  Final  Blood Culture (routine x 2)     Status: None (Preliminary result)   Collection Time: 06/22/16  3:44 PM  Result Value Ref Range Status   Specimen Description BLOOD LEFT HAND  Final   Special Requests BOTTLES DRAWN AEROBIC AND ANAEROBIC 5ML  Final   Culture   Final    NO GROWTH 2 DAYS Performed at [facility]    Report Status PENDING  Incomplete  Blood Culture (routine x 2)     Status: None (Preliminary result)   Collection Time: 06/22/16  4:13 PM  Result Value Ref Range Status   Specimen Description BLOOD RIGHT HAND  Final    Special Requests BOTTLES DRAWN AEROBIC ONLY 4CC  Final   Culture   Final    NO GROWTH 2 DAYS Performed at [facility]    Report Status PENDING  Incomplete     Labs: BNP (last 3 results) No results for input(s): BNP in the last 8760 hours. Basic Metabolic Panel:  Recent Labs Lab 06/21/16 1209 06/22/16 1544 06/23/16 0552 06/24/16 0449 06/25/16 0446  NA 136 134* 139 138 141  K 5.0 3.7 4.5 4.0 3.8  CL 102 104 112* 113* 114*  CO2 22 19* 20* 18* 21*  GLUCOSE 156* 149* 133* 165* 146*  BUN 27* 23* 19 17 13   CREATININE 1.46* 1.47* 1.28* 1.25* 1.07*  CALCIUM 9.5 9.1 8.5* 8.5* 9.5  MG  --   --  1.8  --   --   PHOS  --   --  3.2  --   --    Liver Function Tests:  Recent Labs Lab 06/21/16 1209 06/22/16 1544 06/23/16 0552  AST 12 15 21   ALT 8 13* 13*  ALKPHOS 60 50 44  BILITOT 0.2 0.5 0.6  PROT 6.6 7.4 6.6  ALBUMIN 4.0 3.8 3.2*   No results for input(s): LIPASE, AMYLASE in the last 168 hours. No results for input(s): AMMONIA in the last 168 hours. CBC:  Recent Labs Lab 06/22/16 1425 06/22/16 1544 06/23/16 0552 06/24/16 0449 06/24/16 1609 06/25/16 0446  WBC 16.9* 12.5* 12.2* 8.2  --  7.1  NEUTROABS  --  9.1*  --   --   --   --   HGB 9.2* 8.5* 7.7* 6.9* 8.3* 8.6*  HCT 28.4* 27.1* 25.5* 22.6* 26.6* 27.7*  MCV 82.8 85.0 87.9 86.9  --  85.8  PLT  --  303 244 223  --  264   Cardiac Enzymes: No results for  input(s): CKTOTAL, CKMB, CKMBINDEX, TROPONINI in the last 168 hours. BNP: Invalid input(s): POCBNP CBG:  Recent Labs Lab 06/24/16 1206 06/24/16 1709 06/24/16 2132 06/25/16 0729 06/25/16 1152  GLUCAP 155* 165* 155* 172* 194*   D-Dimer No results for input(s): DDIMER in the last 72 hours. Hgb A1c  Recent Labs  06/23/16 0552  HGBA1C 8.6*   Lipid Profile No results for input(s): CHOL, HDL, LDLCALC, TRIG, CHOLHDL, LDLDIRECT in the last 72 hours. Thyroid function studies  Recent Labs  06/23/16 0552  TSH 1.742   Anemia work up  Recent  Labs  06/23/16 0552  VITAMINB12 142*  FOLATE 15.3  FERRITIN 93  TIBC 312  IRON 7*  RETICCTPCT 1.4   Urinalysis    Component Value Date/Time   COLORURINE YELLOW 06/22/2016 1539   APPEARANCEUR CLOUDY (A) 06/22/2016 1539   LABSPEC 1.006 06/22/2016 1539   PHURINE 6.0 06/22/2016 1539   GLUCOSEU NEGATIVE 06/22/2016 1539   HGBUR LARGE (A) 06/22/2016 1539   BILIRUBINUR NEGATIVE 06/22/2016 1539   BILIRUBINUR negative 06/21/2016 1205   BILIRUBINUR neg 11/30/2014 1834   KETONESUR NEGATIVE 06/22/2016 1539   PROTEINUR 30 (A) 06/22/2016 1539   UROBILINOGEN 0.2 06/21/2016 1205   UROBILINOGEN 0.2 08/31/2014 2056   NITRITE NEGATIVE 06/22/2016 1539   LEUKOCYTESUR LARGE (A) 06/22/2016 1539   Sepsis Labs Invalid input(s): PROCALCITONIN,  WBC,  LACTICIDVEN   Time coordinating discharge: Over 30 minutes  SIGNED:   Janece Canterbury, MD  [facility] 06/25/2016, 11:56 AM Pager   If 7PM-7AM, please contact night-coverage [email or web address] Password [email or web address]

## 2016-06-25 NOTE — Care Management Note (Signed)
Case Management Note  Patient Details  Name: Natalie [name] MRN: [id number] Date of Birth: October 16, 1950  Subjective/Objective:  65 y/o f admitted w/SIRS. From home. No CM needs.                  Action/Plan:d/c home no orders.   Expected Discharge Date:   (unknown)               Expected Discharge Plan:  Home/Self Care  In-House Referral:     Discharge planning Services  CM Consult  Post Acute Care Choice:    Choice offered to:     DME Arranged:    DME Agency:     HH Arranged:    [place] Agency:     Status of Service:  Completed, signed off  If discussed at [employer] of Stay Meetings, dates discussed:    Additional Comments:  Dessa Phi, RN 06/25/2016, 12:47 PM

## 2016-06-25 NOTE — Progress Notes (Signed)
Went over discharge instructions with patient and daughter at bedside. All questions and concerns addressed. Stadol sent home with patient.

## 2016-06-27 LAB — CULTURE, BLOOD (ROUTINE X 2)
Culture: NO GROWTH
Culture: NO GROWTH

## 2016-07-04 NOTE — Progress Notes (Signed)
Subjective:    Patient ID: Natalie [name], female    DOB: 06-19-1951, 65 y.o.   MRN: [id number] Chief Complaint  Patient presents with  . Follow-up    HOSPITAL IP at [facility] 06/22/16    HPI   Natalie [name] is here today for hospital f/u. She was hospitalized from 9/22 to 9/25 for enterococcal sepsis- discharged 10d prior on Augmentin  She did have a 2 mm non-obstructing right lower pole renal calculus with mild right hydronephrosis. In hosp she was treated with vanc and cefepime and then transitioned to Augmentin at d/c with plan to cont this co complete a 10d course until she is seen by urology.  She does NOT have a urinary catheter in place.  Has an appointment with urology in 6d. She took her last Augmentin today.   IDA with + HOC:  Had rbc transfusion on 9/24 with an iron infusion on 9/23 after which ferritin was 93 and was started on oral iron. She was started on a ppi. There has been no talk of leaving on preventative antibiotics. She finished her course of Augmentin today and is feeling well but notes that her infections get really bad really quick.  Not having to much flank pain now.  She is taking B12 and iron.  Vit B12 def: 142 in hosp.  Given 2 inj of vit B12 during hospitalization then transitioned to an oral B12 supp at d/c. Anti-IF nml. Hard to tell if she felt any better after that.  DM:  On glipizide 5 bid.  A1c 2 wks prior was 8.6. Urine Microalb elev 03/2015.  Optho appt (last in epic 02/16)  Has been taking cbgs qam 140-150 fasting.  Sev hours after dinner at night can get to 120-130. No hypoglycemics.  Rarely > 160. No qhs snacks  CKD stage 3: stable.  HTN: On norvasc 5   Hypothyroid: On levothyroxine 75.  TSH nml 2 wks prior.  Sz d/o: stable on phenobarbital. nml folate  HPL: On atorvastatin 20.  Lipids at goal 6 mos proir.  Migraine HA: Has only ever responded to stadol nasal spray and uses 1 bottle/wk.   Insomnia: an zolpidem 5-10mg  qhs prn  Past Medical History:    Diagnosis Date  . Chronic kidney disease    kidney stones, right and left  . Diabetes mellitus without complication ([place])   . Headache    migraines  . Hyperlipidemia   . Hypertension   . Seizures ([place])    none x 30 yrs   Past Surgical History:  Procedure Laterality Date  . CESAREAN SECTION    . CHOLECYSTECTOMY    . KIDNEY STONE SURGERY    . NEPHROLITHOTOMY Right 05/14/2016   Procedure: NEPHROLITHOTOMY PERCUTANEOUS;  Surgeon: Franchot Gallo, MD;  Location: [facility];  Service: Urology;  Laterality: Right;  . SHOULDER ARTHROSCOPY Bilateral    Current Outpatient Prescriptions on File Prior to Visit  Medication Sig Dispense Refill  . amLODipine (NORVASC) 5 MG tablet Take 1 tablet (5 mg total) by mouth daily. 90 tablet 1  . atorvastatin (LIPITOR) 20 MG tablet Take 1 tablet (20 mg total) by mouth daily. 90 tablet 3  . ferrous sulfate 325 (65 FE) MG tablet Take 1 tablet (325 mg total) by mouth 2 (two) times daily with a meal. 60 tablet 0  . glipiZIDE (GLUCOTROL) 5 MG tablet Take 1 tablet (5 mg total) by mouth 2 (two) times daily before a meal. 180 tablet 0  . lactobacillus acidophilus (  BACID) TABS tablet Take 2 tablets by mouth 3 (three) times daily.    Marland Kitchen levothyroxine (SYNTHROID, LEVOTHROID) 75 MCG tablet Take 1 tablet (75 mcg total) by mouth daily. 90 tablet 3  . ondansetron (ZOFRAN-ODT) 8 MG disintegrating tablet DISSOLVE 1 TABLET ON OR UNDER THE TONGUE EVERY 8 HOURS AS NEEDED FOR NAUSEA 20 tablet 5  . PHENobarbital (LUMINAL) 97.2 MG tablet Take 1-2 tablets (97.2-194.4 mg total) by mouth daily. TAKE TWO TABLETS BY MOUTH ONCE DAILY ON  MON,  WED,  AND  FRI  AND  1  TABLET  DAILY  ON  OTHER  DAYS 135 tablet 1  . polycarbophil (FIBERCON) 625 MG tablet Take 625 mg by mouth daily.    . vitamin B-12 1000 MCG tablet Take 1 tablet (1,000 mcg total) by mouth daily. 30 tablet 0  . zolpidem (AMBIEN) 5 MG tablet take 1 tablet by mouth at bedtime for sleep if needed. May repeat x 1 in 1 hr if needed  60 tablet 5   Current Facility-Administered Medications on File Prior to Visit  Medication Dose Route Frequency Provider Last Rate Last Dose  . acetaminophen (TYLENOL) tablet 1,000 mg  1,000 mg Oral Once Wendie Agreste, MD       Allergies  Allergen Reactions  . Compazine     Bad headache, vomiting   . Cymbalta [Duloxetine Hcl]     unknown  . Escitalopram Oxalate Other (See Comments)    Can't sleep  . Methadone Hives  . Nitrofuran Derivatives     unknown  . Macrobid [Nitrofurantoin Monohyd Macro] Rash   Family History  Problem Relation Age of Onset  . Heart disease Father   . Diabetes Father    Social History   Social History  . Marital status: Married    Spouse name: N/A  . Number of children: N/A  . Years of education: N/A   Social History Main Topics  . Smoking status: Never Smoker  . Smokeless tobacco: Never Used  . Alcohol use No  . Drug use: No  . Sexual activity: Not Asked   Other Topics Concern  . None   Social History Narrative  . None    Review of Systems See hpi    Objective:   Physical Exam  Constitutional: She is oriented to person, place, and time. She appears well-developed and well-nourished. No distress.  HENT:  Head: Normocephalic and atraumatic.  Right Ear: External ear normal.  Left Ear: External ear normal.  Eyes: Conjunctivae are normal. No scleral icterus.  Neck: Normal range of motion. Neck supple. No thyromegaly present.  Cardiovascular: Normal rate, regular rhythm, normal heart sounds and intact distal pulses.   Pulmonary/Chest: Effort normal and breath sounds normal. No respiratory distress.  Abdominal: Normal appearance and bowel sounds are normal. There is no hepatosplenomegaly. There is no tenderness. There is CVA tenderness (on Right). There is no rebound and negative Murphy's sign.  Musculoskeletal: She exhibits no edema.  Lymphadenopathy:    She has no cervical adenopathy.  Neurological: She is alert and oriented to  person, place, and time.  Skin: Skin is warm and dry. She is not diaphoretic. No erythema.  Psychiatric: She has a normal mood and affect. Her behavior is normal.   BP 120/68 (BP Location: Left Arm, Patient Position: Sitting, Cuff Size: Normal)   Pulse 74   Temp 98 F (36.7 C) (Oral)   Resp 16   Ht 4\' 9"  (1.448 m)   Wt 143 lb (64.9  kg)   SpO2 96%   BMI 30.94 kg/m      Assessment & Plan:  Refer to GI,  Cont On monthly b12 inj  Bmp, cbc, ua??, lipid, microalb - fasting today Foot exam  Call so we can restart the augmentin Has an appt Nov 30 - recheck B12 and iron.  1. Pyelonephritis   2. Renal stone   3. SIRS (systemic inflammatory response syndrome) (HCC)   4. Iron deficiency anemia, unspecified iron deficiency anemia type   5. Heme positive stool   6. Vitamin B12 deficiency (dietary) anemia   7. Type 2 diabetes mellitus with stage 3 chronic kidney disease, without long-term current use of insulin ([place])   8. Essential hypertension   9. Other specified hypothyroidism   10. Seizure disorder ([place])   11. Hyperlipidemia, unspecified hyperlipidemia type   12. Intractable chronic migraine without aura and without status migrainosus   13. Insomnia, unspecified type     Orders Placed This Encounter  Procedures  . Urine culture  . Basic metabolic panel    Order Specific Question:   Has the patient fasted?    Answer:   No  . Lipid panel    Order Specific Question:   Has the patient fasted?    Answer:   No  . Microalbumin/Creatinine Ratio, Urine  . Ambulatory referral to Gastroenterology    Referral Priority:   Routine    Referral Type:   Consultation    Referral Reason:   Specialty Services Required    Number of Visits Requested:   1  . POCT urinalysis dipstick  . POCT Microscopic Urinalysis ([facility])  . POCT CBC  . POC Hemoccult Bld/Stl (3-Cd Home Screen)    Standing Status:   Future    Number of Occurrences:   1    Standing Expiration Date:   07/05/2017    Meds  ordered this encounter  Medications  . nitrofurantoin, macrocrystal-monohydrate, (MACROBID) 100 MG capsule    Sig: Take 1 capsule (100 mg total) by mouth at bedtime.    Dispense:  30 capsule    Refill:  0  . butorphanol (STADOL) 10 MG/ML nasal spray    Sig: Place 1 spray into the nose every 4 (four) hours as needed for migraine. May refill weekly.    Dispense:  2.5 mL    Refill:  5  . terconazole (TERAZOL 3) 0.8 % vaginal cream    Sig: Place 1 applicator vaginally at bedtime. 3 times a week.    Dispense:  20 g    Refill:  11  . omeprazole (PRILOSEC) 20 MG capsule    Sig: Take 1 capsule (20 mg total) by mouth 2 (two) times daily before a meal.    Dispense:  180 capsule    Refill:  1     Delman Cheadle, M.D.  [facility] [address] [place], [place] [address] [phone] phone [phone] fax  07/31/16 11:41 PM  Results for orders placed or performed in visit on 07/05/16  Urine culture  Result Value Ref Range   Colony Count 50,000-100,000 CFU/mL    Organism ID, Bacteria      Yeast isolated.Please contact the laboratory within 3 days if further identification is desired   Basic metabolic panel  Result Value Ref Range   Sodium 139 135 - 146 mmol/L   Potassium 4.5 3.5 - 5.3 mmol/L   Chloride 103 98 - 110 mmol/L   CO2 21 20 -  31 mmol/L   Glucose, Bld 135 (H) 65 - 99 mg/dL   BUN 28 (H) 7 - 25 mg/dL   Creat 1.76 (H) 0.50 - 0.99 mg/dL   Calcium 10.3 8.6 - 10.4 mg/dL  Lipid panel  Result Value Ref Range   Cholesterol 185 125 - 200 mg/dL   Triglycerides 167 (H) <150 mg/dL   HDL 51 >=46 mg/dL   Total CHOL/HDL Ratio 3.6 <=5.0 Ratio   VLDL 33 (H) <30 mg/dL   LDL Cholesterol 101 <130 mg/dL  Microalbumin/Creatinine Ratio, Urine  Result Value Ref Range   Creatinine, Urine 211 20 - 320 mg/dL   Microalb, Ur 124.6 Not estab mg/dL   Microalb Creat Ratio 591 (H) <30 mcg/mg creat  POCT urinalysis dipstick  Result Value Ref Range   Color, UA  yellow yellow   Clarity, UA clear clear   Glucose, UA negative negative   Bilirubin, UA negative negative   Ketones, POC UA negative negative   Spec Grav, UA 1.020    Blood, UA large (A) negative   pH, UA 5.5    Protein Ur, POC >=300 (A) negative   Urobilinogen, UA 0.2    Nitrite, UA Negative Negative   Leukocytes, UA small (1+) (A) Negative  POCT Microscopic Urinalysis ([facility])  Result Value Ref Range   WBC,UR,HPF,POC Too numerous to count  (A) None WBC/hpf   RBC,UR,HPF,POC Too numerous to count  (A) None RBC/hpf   Bacteria Few (A) None, Too numerous to count   Mucus Absent Absent   Epithelial Cells, UR Per Microscopy Few (A) None, Too numerous to count cells/hpf  POCT CBC  Result Value Ref Range   WBC 7.5 4.6 - 10.2 K/uL   Lymph, poc 1.9 0.6 - 3.4   POC LYMPH PERCENT 25.9 10 - 50 %L   MID (cbc) 0.5 0 - 0.9   POC MID % 6.8 0 - 12 %M   POC Granulocyte 5.0 2 - 6.9   Granulocyte percent 67.3 37 - 80 %G   RBC 4.13 4.04 - 5.48 M/uL   Hemoglobin 11.6 (A) 12.2 - 16.2 g/dL   HCT, POC 33.2 (A) 37.7 - 47.9 %   MCV 80.4 80 - 97 fL   MCH, POC 28.2 27 - 31.2 pg   MCHC 35.1 31.8 - 35.4 g/dL   RDW, POC 17.3 %   Platelet Count, POC 305 142 - 424 K/uL   MPV 8.1 0 - 99.8 fL

## 2016-07-05 ENCOUNTER — Ambulatory Visit (INDEPENDENT_AMBULATORY_CARE_PROVIDER_SITE_OTHER): Payer: Medicare Other | Admitting: Family Medicine

## 2016-07-05 ENCOUNTER — Encounter: Payer: Self-pay | Admitting: Family Medicine

## 2016-07-05 VITALS — BP 120/68 | HR 74 | Temp 98.0°F | Resp 16 | Ht <= 58 in | Wt 143.0 lb

## 2016-07-05 DIAGNOSIS — E1122 Type 2 diabetes mellitus with diabetic chronic kidney disease: Secondary | ICD-10-CM

## 2016-07-05 DIAGNOSIS — G40909 Epilepsy, unspecified, not intractable, without status epilepticus: Secondary | ICD-10-CM

## 2016-07-05 DIAGNOSIS — E038 Other specified hypothyroidism: Secondary | ICD-10-CM

## 2016-07-05 DIAGNOSIS — R6510 Systemic inflammatory response syndrome (SIRS) of non-infectious origin without acute organ dysfunction: Secondary | ICD-10-CM

## 2016-07-05 DIAGNOSIS — N12 Tubulo-interstitial nephritis, not specified as acute or chronic: Secondary | ICD-10-CM | POA: Diagnosis not present

## 2016-07-05 DIAGNOSIS — E785 Hyperlipidemia, unspecified: Secondary | ICD-10-CM

## 2016-07-05 DIAGNOSIS — R195 Other fecal abnormalities: Secondary | ICD-10-CM | POA: Diagnosis not present

## 2016-07-05 DIAGNOSIS — N200 Calculus of kidney: Secondary | ICD-10-CM | POA: Diagnosis not present

## 2016-07-05 DIAGNOSIS — G4700 Insomnia, unspecified: Secondary | ICD-10-CM

## 2016-07-05 DIAGNOSIS — D518 Other vitamin B12 deficiency anemias: Secondary | ICD-10-CM

## 2016-07-05 DIAGNOSIS — G43719 Chronic migraine without aura, intractable, without status migrainosus: Secondary | ICD-10-CM | POA: Diagnosis not present

## 2016-07-05 DIAGNOSIS — I10 Essential (primary) hypertension: Secondary | ICD-10-CM

## 2016-07-05 DIAGNOSIS — D509 Iron deficiency anemia, unspecified: Secondary | ICD-10-CM | POA: Diagnosis not present

## 2016-07-05 DIAGNOSIS — N183 Chronic kidney disease, stage 3 (moderate): Secondary | ICD-10-CM | POA: Diagnosis not present

## 2016-07-05 LAB — POCT CBC
GRANULOCYTE PERCENT: 67.3 % (ref 37–80)
HEMATOCRIT: 33.2 % — AB (ref 37.7–47.9)
Hemoglobin: 11.6 g/dL — AB (ref 12.2–16.2)
Lymph, poc: 1.9 (ref 0.6–3.4)
MCH, POC: 28.2 pg (ref 27–31.2)
MCHC: 35.1 g/dL (ref 31.8–35.4)
MCV: 80.4 fL (ref 80–97)
MID (CBC): 0.5 (ref 0–0.9)
MPV: 8.1 fL (ref 0–99.8)
POC GRANULOCYTE: 5 (ref 2–6.9)
POC LYMPH %: 25.9 % (ref 10–50)
POC MID %: 6.8 %M (ref 0–12)
Platelet Count, POC: 305 10*3/uL (ref 142–424)
RBC: 4.13 M/uL (ref 4.04–5.48)
RDW, POC: 17.3 %
WBC: 7.5 10*3/uL (ref 4.6–10.2)

## 2016-07-05 LAB — POCT URINALYSIS DIP (MANUAL ENTRY)
BILIRUBIN UA: NEGATIVE
Bilirubin, UA: NEGATIVE
GLUCOSE UA: NEGATIVE
Nitrite, UA: NEGATIVE
SPEC GRAV UA: 1.02
Urobilinogen, UA: 0.2
pH, UA: 5.5

## 2016-07-05 LAB — POC MICROSCOPIC URINALYSIS (UMFC): MUCUS RE: ABSENT

## 2016-07-05 MED ORDER — OMEPRAZOLE 20 MG PO CPDR: 20.0000 mg | DELAYED_RELEASE_CAPSULE | Freq: Two times a day (BID) | ORAL | 1 refills | Status: DC

## 2016-07-05 MED ORDER — BUTORPHANOL TARTRATE 10 MG/ML NA SOLN: 1.0000 | NASAL | 5 refills | Status: DC | PRN

## 2016-07-05 MED ORDER — TERCONAZOLE 0.8 % VA CREA: 1.0000 | TOPICAL_CREAM | Freq: Every day | VAGINAL | 11 refills | Status: DC

## 2016-07-05 MED ORDER — NITROFURANTOIN MONOHYD MACRO 100 MG PO CAPS: 100.0000 mg | ORAL_CAPSULE | Freq: Every day | ORAL | 0 refills | Status: DC

## 2016-07-05 NOTE — Patient Instructions (Signed)
     IF you received an x-ray today, you will receive an invoice from [facility]. Please contact [facility] at [phone] with questions or concerns regarding your invoice.   IF you received labwork today, you will receive an invoice from [facility]. Please contact [facility] at [phone] with questions or concerns regarding your invoice.   Our billing staff will not be able to assist you with questions regarding bills from these companies.  You will be contacted with the lab results as soon as they are available. The fastest way to get your results is to activate your My Chart account. Instructions are located on the last page of this paperwork. If you have not heard from us regarding the results in 2 weeks, please contact this office.      

## 2016-07-06 ENCOUNTER — Ambulatory Visit (INDEPENDENT_AMBULATORY_CARE_PROVIDER_SITE_OTHER): Payer: Medicare Other | Admitting: Family Medicine

## 2016-07-06 VITALS — BP 122/80 | HR 67 | Temp 98.6°F | Resp 16 | Ht <= 58 in | Wt 144.4 lb

## 2016-07-06 DIAGNOSIS — N10 Acute tubulo-interstitial nephritis: Secondary | ICD-10-CM

## 2016-07-06 DIAGNOSIS — B3741 Candidal cystitis and urethritis: Secondary | ICD-10-CM

## 2016-07-06 DIAGNOSIS — N179 Acute kidney failure, unspecified: Secondary | ICD-10-CM | POA: Diagnosis not present

## 2016-07-06 DIAGNOSIS — D509 Iron deficiency anemia, unspecified: Secondary | ICD-10-CM | POA: Diagnosis not present

## 2016-07-06 DIAGNOSIS — N183 Chronic kidney disease, stage 3 (moderate): Secondary | ICD-10-CM

## 2016-07-06 LAB — BASIC METABOLIC PANEL
BUN: 28 mg/dL — ABNORMAL HIGH (ref 7–25)
BUN: 28 mg/dL — ABNORMAL HIGH (ref 7–25)
CALCIUM: 10.3 mg/dL (ref 8.6–10.4)
CALCIUM: 9.4 mg/dL (ref 8.6–10.4)
CHLORIDE: 103 mmol/L (ref 98–110)
CHLORIDE: 109 mmol/L (ref 98–110)
CO2: 21 mmol/L (ref 20–31)
CO2: 20 mmol/L (ref 20–31)
CREATININE: 1.32 mg/dL — AB (ref 0.50–0.99)
Creat: 1.76 mg/dL — ABNORMAL HIGH (ref 0.50–0.99)
GLUCOSE: 135 mg/dL — AB (ref 65–99)
Glucose, Bld: 106 mg/dL — ABNORMAL HIGH (ref 65–99)
Potassium: 4.5 mmol/L (ref 3.5–5.3)
Potassium: 4.1 mmol/L (ref 3.5–5.3)
SODIUM: 139 mmol/L (ref 135–146)
Sodium: 140 mmol/L (ref 135–146)

## 2016-07-06 LAB — POCT CBC
Granulocyte percent: 80 %G (ref 37–80)
HCT, POC: 32.6 % — AB (ref 37.7–47.9)
HEMOGLOBIN: 11 g/dL — AB (ref 12.2–16.2)
Lymph, poc: 1.5 (ref 0.6–3.4)
MCH: 28.4 pg (ref 27–31.2)
MCHC: 33.6 g/dL (ref 31.8–35.4)
MCV: 84.3 fL (ref 80–97)
MID (cbc): 0.1 (ref 0–0.9)
MPV: 8.2 fL (ref 0–99.8)
POC GRANULOCYTE: 6.3 (ref 2–6.9)
POC LYMPH PERCENT: 18.9 %L (ref 10–50)
POC MID %: 1.1 %M (ref 0–12)
Platelet Count, POC: 269 10*3/uL (ref 142–424)
RBC: 3.87 M/uL — AB (ref 4.04–5.48)
RDW, POC: 18.2 %
WBC: 7.9 10*3/uL (ref 4.6–10.2)

## 2016-07-06 LAB — POCT URINALYSIS DIP (MANUAL ENTRY)
Bilirubin, UA: NEGATIVE
Glucose, UA: NEGATIVE
Ketones, POC UA: NEGATIVE
NITRITE UA: NEGATIVE
PH UA: 5.5
Spec Grav, UA: <=1.005
UROBILINOGEN UA: 0.2

## 2016-07-06 LAB — MICROALBUMIN / CREATININE URINE RATIO
Creatinine, Urine: 211 mg/dL (ref 20–320)
Microalb Creat Ratio: 591 ug/mg{creat} — ABNORMAL HIGH
Microalb, Ur: 124.6 mg/dL

## 2016-07-06 LAB — LIPID PANEL
Cholesterol: 185 mg/dL (ref 125–200)
HDL: 51 mg/dL
LDL Cholesterol: 101 mg/dL
Total CHOL/HDL Ratio: 3.6 ratio
Triglycerides: 167 mg/dL — ABNORMAL HIGH
VLDL: 33 mg/dL — ABNORMAL HIGH

## 2016-07-06 LAB — POC MICROSCOPIC URINALYSIS (UMFC): Mucus: ABSENT

## 2016-07-06 NOTE — Progress Notes (Addendum)
Subjective:  By signing my name below, I, [name], attest that this documentation has been prepared under the direction and in the presence of Delman Cheadle, MD.  Electronically Signed: Thea Alken, ED Scribe. 07/06/2016. 1:49 PM.   Patient ID: Natalie [name], female    DOB: Jun 15, 1951, 66 y.o.   MRN: [id number] CC:  F/u acute renal failure HPI  HPI Comments: Natalie [name] is a 65 y.o. female who presents to the [facility] for a follow up. Pt was seen yesterday by myself for a hospital f/u visit. She has had very complicated recent course. Her trouble started 3 months ago when she developed sudden onset sepsis secondary to pyelonephritis  with obstructed right renal stone. She had urostomy tube place while infection was treated with urology ureteral stent which helped her pass 6 mm stone and release the bulk of hydronephrosis. After the 4 day hospitalization she had several other UTI's which cause acute renal failure required treatment in ED. The nephrolithotomy done 8/14 and she did well the following month until she again felt nephritis. Requiring hospitalization for sepsis. I saw her in f/u yesterday she had been treated for enterococcal sepsis during her hospitalization and discharged with 10 days of Augmentin. She finished the course yesterday and had her start nitrofurantoin for prophylaxis  to cover her at least until her urology appointment. She has an appointment in 6 days. At hospitalization her creatinine upon discharge was 1.07 (GFR 52) and had peaked at 1.47(GFR 36) during her recent hospitalization. Her labs yesterday returned showing creatinine of 1.76(GFR 29) but BUN increased 28 so hoping this is secondary to dehydration. I requested pt to come in today to receive and to recheck renal function. Pt did give a urine sample yesterday which showed tons WBC and RBC but few bacteria. New severe proteinuria over 300;culture pending. During pt recent hospitalization her hemoglobin  dropped to 6.9 requiring transfusion and IV iron. Her ferritin was 53 but elevated sue to sepsis as iron was 7. At hospital discharge hemoglobin had increased to 8.6. And was up to 11.6 yesterday pt denies any bleeding hx and is quite confounding to anemia however she did have positive hemoccult and was started on PPI and I placed a GI referral yesterday as well sent her home with repeat cards x3. She continues to denies symptoms or signs of blood loss and has never had a problem like this prior.   Patient Active Problem List   Diagnosis Date Noted  . Anemia, iron deficiency 06/23/2016  . Vitamin B12 deficiency (dietary) anemia 06/23/2016  . Renal stone 05/14/2016  . SIRS (systemic inflammatory response syndrome) ([place]) 04/10/2016  . Acute kidney injury ([place]) 04/10/2016  . Type 2 diabetes mellitus ([place]) 01/04/2016  . Migraine, chronic, without aura, intractable, with status migrainosus 01/04/2016  . Abdominal pain   . AKI (acute kidney injury) ([place])   . Pyelonephritis 08/31/2014  . 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 BMI 33.0-33.9,adult 03/30/2013  . Recurrent UTI 09/17/2012  . DM (diabetes mellitus) ([place]) 03/19/2012  . HTN (hypertension) 03/19/2012  . Hyperlipemia 03/19/2012  . Hypothyroidism 03/19/2012  . Seizure disorder ([place]) 03/19/2012  . Insomnia 03/19/2012  . Migraine 03/19/2012   Past Medical History:  Diagnosis Date  . Chronic kidney disease    kidney stones, right and left  . Diabetes mellitus without complication ([place])   . Headache    migraines  . Hyperlipidemia   . Hypertension   .  Seizures ([place])    none x 30 yrs   Past Surgical History:  Procedure Laterality Date  . CESAREAN SECTION    . CHOLECYSTECTOMY    . KIDNEY STONE SURGERY    . NEPHROLITHOTOMY Right 05/14/2016   Procedure: NEPHROLITHOTOMY PERCUTANEOUS;  Surgeon: Franchot Gallo, MD;  Location: [facility];  Service: Urology;  Laterality: Right;  . SHOULDER  ARTHROSCOPY Bilateral    Allergies  Allergen Reactions  . Compazine     Bad headache, vomiting   . Cymbalta [Duloxetine Hcl]     unknown  . Escitalopram Oxalate Other (See Comments)    Can't sleep  . Methadone Hives  . Nitrofuran Derivatives     unknown   Prior to Admission medications   Medication Sig Start Date End Date Taking? Authorizing Provider  amLODipine (NORVASC) 5 MG tablet Take 1 tablet (5 mg total) by mouth daily. 05/31/16   Shawnee Knapp, MD  atorvastatin (LIPITOR) 20 MG tablet Take 1 tablet (20 mg total) by mouth daily. 01/04/16   Leandrew Koyanagi, MD  butorphanol (STADOL) 10 MG/ML nasal spray Place 1 spray into the nose every 4 (four) hours as needed for migraine. May refill weekly. 07/05/16   Shawnee Knapp, MD  ferrous sulfate 325 (65 FE) MG tablet Take 1 tablet (325 mg total) by mouth 2 (two) times daily with a meal. 06/25/16   Janece Canterbury, MD  glipiZIDE (GLUCOTROL) 5 MG tablet Take 1 tablet (5 mg total) by mouth 2 (two) times daily before a meal. 05/31/16   Shawnee Knapp, MD  lactobacillus acidophilus (BACID) TABS tablet Take 2 tablets by mouth 3 (three) times daily.    Historical Provider, MD  levothyroxine (SYNTHROID, LEVOTHROID) 75 MCG tablet Take 1 tablet (75 mcg total) by mouth daily. 01/04/16   Leandrew Koyanagi, MD  nitrofurantoin, macrocrystal-monohydrate, (MACROBID) 100 MG capsule Take 1 capsule (100 mg total) by mouth at bedtime. 07/05/16   Shawnee Knapp, MD  omeprazole (PRILOSEC) 20 MG capsule Take 1 capsule (20 mg total) by mouth 2 (two) times daily before a meal. 07/05/16   Shawnee Knapp, MD  ondansetron (ZOFRAN-ODT) 8 MG disintegrating tablet DISSOLVE 1 TABLET ON OR UNDER THE TONGUE EVERY 8 HOURS AS NEEDED FOR NAUSEA 05/31/16   Shawnee Knapp, MD  PHENobarbital (LUMINAL) 97.2 MG tablet Take 1-2 tablets (97.2-194.4 mg total) by mouth daily. TAKE TWO TABLETS BY MOUTH ONCE DAILY ON  MON,  WED,  AND  FRI  AND  1  TABLET  DAILY  ON  OTHER  DAYS 05/31/16   Shawnee Knapp, MD  polycarbophil  (FIBERCON) 625 MG tablet Take 625 mg by mouth daily.    Historical Provider, MD  terconazole (TERAZOL 3) 0.8 % vaginal cream Place 1 applicator vaginally at bedtime. 3 times a week. 07/05/16   Shawnee Knapp, MD  vitamin B-12 1000 MCG tablet Take 1 tablet (1,000 mcg total) by mouth daily. 06/26/16   Janece Canterbury, MD  zolpidem (AMBIEN) 5 MG tablet take 1 tablet by mouth at bedtime for sleep if needed. May repeat x 1 in 1 hr if needed 05/31/16   Shawnee Knapp, MD   Social History   Social History  . Marital status: Married    Spouse name: N/A  . Number of children: N/A  . Years of education: N/A   Occupational History  . Not on file.   Social History Main Topics  . Smoking status: Never Smoker  . Smokeless tobacco:  Never Used  . Alcohol use No  . Drug use: No  . Sexual activity: Not on file   Other Topics Concern  . Not on file   Social History Narrative  . No narrative on file      Review of Systems  Constitutional: Positive for activity change, appetite change and fatigue. Negative for chills and fever.  Genitourinary: Positive for flank pain. Negative for difficulty urinating, dysuria, frequency and hematuria.  Musculoskeletal: Positive for arthralgias and back pain. Negative for gait problem.  Allergic/Immunologic: Negative for immunocompromised state.  Neurological: Positive for weakness and headaches. Negative for seizures.  Psychiatric/Behavioral: Negative for sleep disturbance.       Objective:   Physical Exam  Constitutional: She is oriented to person, place, and time. She appears well-developed and well-nourished. She has a sickly appearance. No distress.  HENT:  Head: Normocephalic and atraumatic.  Right Ear: External ear normal.  Left Ear: External ear normal.  Eyes: Conjunctivae are normal. No scleral icterus.  Cardiovascular: Normal rate, regular rhythm, normal heart sounds and intact distal pulses.   Pulmonary/Chest: Effort normal and breath sounds normal. No  respiratory distress.  Musculoskeletal: She exhibits no edema.  Neurological: She is alert and oriented to person, place, and time.  Skin: Skin is warm and dry. She is not diaphoretic. No erythema.  Psychiatric: She has a normal mood and affect. Her behavior is normal.    Vitals:   07/06/16 1352  BP: 122/80  Pulse: 67  Resp: 16  Temp: 98.6 F (37 C)  TempSrc: Oral  SpO2: 99%  Weight: 144 lb 6.4 oz (65.5 kg)  Height: 4\' 9"  (1.448 m)     Assessment & Plan:   1. Acute pyelonephritis - cont on macrobid 100 qd until urology eval as has had 4 episodes of pyelo and 2 hosp for sepsis sec to pyelo in the past 3 mos.  2. AKI (acute kidney injury) ([place]) -Suspect pre-renal due to dehydration. Given 2 L NS IVF given today in office resulting in improved Cr from 1.76 (GFR 29)  to 1.32.  Baseline Cr. 1.07- 1.47   3. IDA - hgb slightly decreased from 11.9 -> 11 since yest but hopefully this is dilutional. Was referred to GI yest and given home [place] to complete.  Yeast cystitis - fluconazole at half the usually dose since GFR <50  Orders Placed This Encounter  Procedures  . Urine culture  . Basic metabolic panel    Order Specific Question:   Has the patient fasted?    Answer:   No  . POCT CBC  . POCT urinalysis dipstick  . POCT Microscopic Urinalysis ([facility])     I personally performed the services described in this documentation, which was scribed in my presence. The recorded information has been reviewed and considered, and addended by me as needed.   Delman Cheadle, M.D.  [facility] [address] [place], [place] [address] [phone] phone [phone] fax  07/06/16 10:21 PM  Results for orders placed or performed in visit on 08/67/61  Basic metabolic panel  Result Value Ref Range   Sodium 140 135 - 146 mmol/L   Potassium 4.1 3.5 - 5.3 mmol/L   Chloride 109 98 - 110 mmol/L   CO2 20 20 - 31 mmol/L   Glucose, Bld 106 (H) 65 - 99 mg/dL   BUN 28  (H) 7 - 25 mg/dL   Creat 1.32 (H) 0.50 - 0.99 mg/dL  Calcium 9.4 8.6 - 10.4 mg/dL  POCT CBC  Result Value Ref Range   WBC 7.9 4.6 - 10.2 K/uL   Lymph, poc 1.5 0.6 - 3.4   POC LYMPH PERCENT 18.9 10 - 50 %L   MID (cbc) 0.1 0 - 0.9   POC MID % 1.1 0 - 12 %M   POC Granulocyte 6.3 2 - 6.9   Granulocyte percent 80.0 37 - 80 %G   RBC 3.87 (A) 4.04 - 5.48 M/uL   Hemoglobin 11.0 (A) 12.2 - 16.2 g/dL   HCT, POC 32.6 (A) 37.7 - 47.9 %   MCV 84.3 80 - 97 fL   MCH, POC 28.4 27 - 31.2 pg   MCHC 33.6 31.8 - 35.4 g/dL   RDW, POC 18.2 %   Platelet Count, POC 269 142 - 424 K/uL   MPV 8.2 0 - 99.8 fL  POCT urinalysis dipstick  Result Value Ref Range   Color, UA yellow yellow   Clarity, UA clear clear   Glucose, UA negative negative   Bilirubin, UA negative negative   Ketones, POC UA negative negative   Spec Grav, UA <=1.005    Blood, UA large (A) negative   pH, UA 5.5    Protein Ur, POC =30 (A) negative   Urobilinogen, UA 0.2    Nitrite, UA Negative Negative   Leukocytes, UA small (1+) (A) Negative  POCT Microscopic Urinalysis ([facility])  Result Value Ref Range   WBC,UR,HPF,POC Too numerous to count  (A) None WBC/hpf   RBC,UR,HPF,POC Too numerous to count  (A) None RBC/hpf   Bacteria Few (A) None, Too numerous to count   Mucus Absent Absent   Epithelial Cells, UR Per Microscopy Few (A) None, Too numerous to count cells/hpf

## 2016-07-07 LAB — URINE CULTURE

## 2016-07-08 LAB — URINE CULTURE

## 2016-07-09 ENCOUNTER — Telehealth: Payer: Self-pay | Admitting: Family Medicine

## 2016-07-09 MED ORDER — FLUCONAZOLE 100 MG PO TABS: ORAL_TABLET | ORAL | 0 refills | Status: DC

## 2016-07-09 NOTE — Addendum Note (Signed)
Addended by: Delman Cheadle on: 07/09/2016 01:29 PM   Modules accepted: Orders

## 2016-07-09 NOTE — Telephone Encounter (Signed)
Pt has an allergic reaction to the Macrobid with bumps on legs with itching she would like another RX called into pharmacy

## 2016-07-11 DIAGNOSIS — N200 Calculus of kidney: Secondary | ICD-10-CM | POA: Diagnosis not present

## 2016-07-11 NOTE — Telephone Encounter (Signed)
Perfect. Agree to complete course of fluconazole and discuss starting on a daily prophylactic antibiotic with urology. Also would like urology to recheck her cr today and ask them to fax [place] a copy of the visit and the labs.  If pt has any further questions/concerns after the urology appt, please let me know.

## 2016-07-11 NOTE — Telephone Encounter (Signed)
Patient most recently seen by Dr. Brigitte Pulse. It appears from her note that she had an appointment today with a urologist. Would clarify with urologist if other antibiotics needed and allow them to choose for her infection

## 2016-07-11 NOTE — Telephone Encounter (Signed)
macrobid gave a red/bumps and itching on legs.after 4 days on it, so she stopped, just started her diflucan (she thought this was a new antibiotic, I explained it was not but she should still take)  Please advise if new antibiotic is needed or next step.  Added to allergy list macrobid.

## 2016-07-12 ENCOUNTER — Ambulatory Visit (INDEPENDENT_AMBULATORY_CARE_PROVIDER_SITE_OTHER): Payer: Medicare Other | Admitting: Physician Assistant

## 2016-07-12 VITALS — BP 138/70 | HR 71 | Temp 97.6°F | Resp 16 | Ht <= 58 in | Wt 143.0 lb

## 2016-07-12 DIAGNOSIS — H8111 Benign paroxysmal vertigo, right ear: Secondary | ICD-10-CM

## 2016-07-12 DIAGNOSIS — D509 Iron deficiency anemia, unspecified: Secondary | ICD-10-CM | POA: Diagnosis not present

## 2016-07-12 DIAGNOSIS — N179 Acute kidney failure, unspecified: Secondary | ICD-10-CM

## 2016-07-12 DIAGNOSIS — R195 Other fecal abnormalities: Secondary | ICD-10-CM

## 2016-07-12 LAB — POC HEMOCCULT BLD/STL (HOME/3-CARD/SCREEN)
Card #2 Fecal Occult Blod, POC: NEGATIVE
FECAL OCCULT BLD: NEGATIVE
FECAL OCCULT BLD: NEGATIVE

## 2016-07-12 LAB — BASIC METABOLIC PANEL
BUN: 23 mg/dL (ref 7–25)
CHLORIDE: 105 mmol/L (ref 98–110)
CO2: 23 mmol/L (ref 20–31)
CREATININE: 1.13 mg/dL — AB (ref 0.50–0.99)
Calcium: 10.6 mg/dL — ABNORMAL HIGH (ref 8.6–10.4)
GLUCOSE: 168 mg/dL — AB (ref 65–99)
POTASSIUM: 5.2 mmol/L (ref 3.5–5.3)
Sodium: 139 mmol/L (ref 135–146)

## 2016-07-12 MED ORDER — MECLIZINE HCL 25 MG PO TABS: 25.0000 mg | ORAL_TABLET | Freq: Three times a day (TID) | ORAL | 0 refills | Status: DC | PRN

## 2016-07-12 NOTE — Patient Instructions (Addendum)
  Epley Maneuvers    IF you received an x-ray today, you will receive an invoice from [facility]. Please contact [facility] at [phone] with questions or concerns regarding your invoice.   IF you received labwork today, you will receive an invoice from [employer]. Please contact [facility] at [phone] with questions or concerns regarding your invoice.   Our billing staff will not be able to assist you with questions regarding bills from these companies.  You will be contacted with the lab results as soon as they are available. The fastest way to get your results is to activate your My Chart account. Instructions are located on the last page of this paperwork. If you have not heard from [place] regarding the results in 2 weeks, please contact this office.

## 2016-07-12 NOTE — Telephone Encounter (Signed)
Called pt who was in our 102 lobby getting ready to be seen. I advised pt to have provider who sees her read Dr Raul Del message from this ph encounter for info from Dr Brigitte Pulse.

## 2016-07-12 NOTE — Progress Notes (Signed)
Natalie [name]  MRN: [id number] DOB: Apr 04, 1951  Subjective:  Pt presents to clinic with vertigo that started 2 days ago.  She moved on bed and that caused the room to spin - she was seen at the urologist and she was asked to lay down and it happened again.  Since then whenever she moves her head she could have the sensation of the room spinning.  She is not nauseated from the vertigo.  2 days prior to the symptoms starting she developed an allergic rash to Macrobid.  She feels like movement up and down makes the vertigo the worse.  She is able to sleep in bed because once she lays down the vertigo stops and then she can sleep.  She feels like the spinning goes to the left side.  10/11 - stent was removed and he checked her urine and it was clear - she was started on Bactrim for the next 4 days since the stent was placed - he did not say anything about going on daily prophalyxis - he did not check her blood work  Review of Systems  Constitutional: Negative for chills and fever.  HENT: Negative.   Gastrointestinal: Negative for nausea.  Neurological: Positive for dizziness. Negative for weakness, numbness and headaches.    Patient Active Problem List   Diagnosis Date Noted  . Anemia, iron deficiency 06/23/2016  . Vitamin B12 deficiency (dietary) anemia 06/23/2016  . Renal stone 05/14/2016  . SIRS (systemic inflammatory response syndrome) ([place]) 04/10/2016  . Acute kidney injury ([place]) 04/10/2016  . Type 2 diabetes mellitus ([place]) 01/04/2016  . Migraine, chronic, without aura, intractable, with status migrainosus 01/04/2016  . Abdominal pain   . AKI (acute kidney injury) ([place])   . Pyelonephritis 08/31/2014  . 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 BMI 33.0-33.9,adult 03/30/2013  . Recurrent UTI 09/17/2012  . DM (diabetes mellitus) ([place]) 03/19/2012  . HTN (hypertension) 03/19/2012  . Hyperlipemia 03/19/2012  . Hypothyroidism 03/19/2012  .  Seizure disorder ([place]) 03/19/2012  . Insomnia 03/19/2012  . Migraine 03/19/2012    Current Outpatient Prescriptions on File Prior to Visit  Medication Sig Dispense Refill  . amLODipine (NORVASC) 5 MG tablet Take 1 tablet (5 mg total) by mouth daily. 90 tablet 1  . atorvastatin (LIPITOR) 20 MG tablet Take 1 tablet (20 mg total) by mouth daily. 90 tablet 3  . butorphanol (STADOL) 10 MG/ML nasal spray Place 1 spray into the nose every 4 (four) hours as needed for migraine. May refill weekly. 2.5 mL 5  . ferrous sulfate 325 (65 FE) MG tablet Take 1 tablet (325 mg total) by mouth 2 (two) times daily with a meal. 60 tablet 0  . fluconazole (DIFLUCAN) 100 MG tablet Take 2 tabs po x 1 on day 1, then 1 tab po qd for 2 weeks 15 tablet 0  . glipiZIDE (GLUCOTROL) 5 MG tablet Take 1 tablet (5 mg total) by mouth 2 (two) times daily before a meal. 180 tablet 0  . lactobacillus acidophilus (BACID) TABS tablet Take 2 tablets by mouth 3 (three) times daily.    Marland Kitchen levothyroxine (SYNTHROID, LEVOTHROID) 75 MCG tablet Take 1 tablet (75 mcg total) by mouth daily. 90 tablet 3  . nitrofurantoin, macrocrystal-monohydrate, (MACROBID) 100 MG capsule Take 1 capsule (100 mg total) by mouth at bedtime. 30 capsule 0  . omeprazole (PRILOSEC) 20 MG capsule Take 1 capsule (20 mg total) by mouth 2 (two) times daily before a meal.  180 capsule 1  . ondansetron (ZOFRAN-ODT) 8 MG disintegrating tablet DISSOLVE 1 TABLET ON OR UNDER THE TONGUE EVERY 8 HOURS AS NEEDED FOR NAUSEA 20 tablet 5  . PHENobarbital (LUMINAL) 97.2 MG tablet Take 1-2 tablets (97.2-194.4 mg total) by mouth daily. TAKE TWO TABLETS BY MOUTH ONCE DAILY ON  MON,  WED,  AND  FRI  AND  1  TABLET  DAILY  ON  OTHER  DAYS 135 tablet 1  . polycarbophil (FIBERCON) 625 MG tablet Take 625 mg by mouth daily.    Marland Kitchen terconazole (TERAZOL 3) 0.8 % vaginal cream Place 1 applicator vaginally at bedtime. 3 times a week. 20 g 11  . vitamin B-12 1000 MCG tablet Take 1 tablet (1,000 mcg  total) by mouth daily. 30 tablet 0  . zolpidem (AMBIEN) 5 MG tablet take 1 tablet by mouth at bedtime for sleep if needed. May repeat x 1 in 1 hr if needed 60 tablet 5   Current Facility-Administered Medications on File Prior to Visit  Medication Dose Route Frequency Provider Last Rate Last Dose  . acetaminophen (TYLENOL) tablet 1,000 mg  1,000 mg Oral Once Wendie Agreste, MD        Allergies  Allergen Reactions  . Compazine     Bad headache, vomiting   . Cymbalta [Duloxetine Hcl]     unknown  . Escitalopram Oxalate Other (See Comments)    Can't sleep  . Methadone Hives  . Nitrofuran Derivatives     unknown  . Macrobid [Nitrofurantoin Monohyd Macro] Rash    Pt patients past, family and social history were reviewed and updated.  Objective:  BP 138/70 (BP Location: Right Arm, Patient Position: Sitting, Cuff Size: Normal)   Pulse 71   Temp 97.6 F (36.4 C) (Oral)   Resp 16   Ht 4\' 9"  (1.448 m)   Wt 143 lb (64.9 kg)   SpO2 99%   BMI 30.94 kg/m   Physical Exam  Constitutional: She is oriented to person, place, and time and well-developed, well-nourished, and in no distress.  HENT:  Head: Normocephalic and atraumatic.  Right Ear: Hearing and external ear normal.  Left Ear: Hearing and external ear normal.  Eyes: Conjunctivae, EOM and lids are normal. Pupils are equal, round, and reactive to light. Right eye exhibits no nystagmus. Left eye exhibits no nystagmus.  When patient moves her head to the left she experiences vertigo but nothing to the right - when she lays down she also has vertigo to the left  Neck: Normal range of motion.  Cardiovascular: Normal rate, regular rhythm and normal heart sounds.   No murmur heard. Pulmonary/Chest: Effort normal and breath sounds normal. She has no wheezes.  Neurological: She is alert and oriented to person, place, and time. Gait normal.  Skin: Skin is warm and dry.  Psychiatric: Mood, memory, affect and judgment normal.  Vitals  reviewed.  Results for orders placed or performed in visit on 16/10/96  Basic metabolic panel  Result Value Ref Range   Sodium 139 135 - 146 mmol/L   Potassium 5.2 3.5 - 5.3 mmol/L   Chloride 105 98 - 110 mmol/L   CO2 23 20 - 31 mmol/L   Glucose, Bld 168 (H) 65 - 99 mg/dL   BUN 23 7 - 25 mg/dL   Creat 1.13 (H) 0.50 - 0.99 mg/dL   Calcium 10.6 (H) 8.6 - 10.4 mg/dL  POC Hemoccult Bld/Stl (3-Cd Home Screen)  Result Value Ref Range   Card #  1 Date 07/12/2016    Fecal Occult Blood, POC Negative Negative   Card #2 Date 07/12/2016    Card #2 Fecal Occult Blod, POC Negative    Card #3 Date 07/12/2016    Card #3 Fecal Occult Blood, POC Negative     Assessment and Plan :  AKI (acute kidney injury) ([place]) - Plan: Basic metabolic panel - will discuss with Dr Brigitte Pulse daily UTI prophylaxis  Benign paroxysmal positional vertigo of right ear - Plan: meclizine (ANTIVERT) 25 MG tablet - pt will return home and try Epley maneuvers for the next several days (her daughter knows how to do them) she will RTC in 5 days if her vertigo is not improving - she has never had this before - she is tolerating her abx ok - she will continue to push fluid  Iron deficiency anemia, unspecified iron deficiency anemia type - Plan: POC Hemoccult Bld/Stl (3-Cd Home Screen)  Heme positive stool - Plan: POC Hemoccult Bld/Stl (3-Cd Home Screen)  Windell Hummingbird PA-C  [facility] 07/13/2016 10:22 AM

## 2016-07-13 ENCOUNTER — Other Ambulatory Visit: Payer: Self-pay | Admitting: Family Medicine

## 2016-07-13 MED ORDER — SULFAMETHOXAZOLE-TRIMETHOPRIM 400-80 MG PO TABS: 1.0000 | ORAL_TABLET | Freq: Every day | ORAL | 2 refills | Status: DC

## 2016-07-17 ENCOUNTER — Telehealth: Payer: Self-pay | Admitting: Family Medicine

## 2016-07-17 MED ORDER — BUTORPHANOL TARTRATE 10 MG/ML NA SOLN: 1.0000 | NASAL | 5 refills | Status: DC | PRN

## 2016-07-17 NOTE — Telephone Encounter (Signed)
Dr Brigitte Pulse pt need a refill on Stadol please respond

## 2016-07-17 NOTE — Telephone Encounter (Signed)
Refilled, can pick up 10/18

## 2016-07-18 NOTE — Telephone Encounter (Signed)
Pt. Advised stadol rx up front for pick up

## 2016-07-25 DIAGNOSIS — N200 Calculus of kidney: Secondary | ICD-10-CM | POA: Diagnosis not present

## 2016-08-07 ENCOUNTER — Other Ambulatory Visit: Payer: Self-pay | Admitting: Family Medicine

## 2016-08-10 ENCOUNTER — Encounter: Payer: Self-pay | Admitting: Internal Medicine

## 2016-08-10 ENCOUNTER — Encounter: Payer: Self-pay | Admitting: *Deleted

## 2016-08-30 ENCOUNTER — Ambulatory Visit: Payer: Medicare Other | Admitting: Family Medicine

## 2016-08-31 ENCOUNTER — Ambulatory Visit: Payer: Medicare Other | Admitting: Internal Medicine

## 2016-09-06 ENCOUNTER — Encounter: Payer: Self-pay | Admitting: Family Medicine

## 2016-09-06 ENCOUNTER — Ambulatory Visit (INDEPENDENT_AMBULATORY_CARE_PROVIDER_SITE_OTHER): Payer: Medicare Other | Admitting: Family Medicine

## 2016-09-06 VITALS — BP 110/80 | HR 73 | Temp 98.2°F | Resp 16 | Ht <= 58 in | Wt 151.2 lb

## 2016-09-06 DIAGNOSIS — N183 Chronic kidney disease, stage 3 unspecified: Secondary | ICD-10-CM

## 2016-09-06 DIAGNOSIS — D518 Other vitamin B12 deficiency anemias: Secondary | ICD-10-CM

## 2016-09-06 DIAGNOSIS — E038 Other specified hypothyroidism: Secondary | ICD-10-CM | POA: Diagnosis not present

## 2016-09-06 DIAGNOSIS — I10 Essential (primary) hypertension: Secondary | ICD-10-CM

## 2016-09-06 DIAGNOSIS — E1122 Type 2 diabetes mellitus with diabetic chronic kidney disease: Secondary | ICD-10-CM

## 2016-09-06 DIAGNOSIS — E785 Hyperlipidemia, unspecified: Secondary | ICD-10-CM

## 2016-09-06 DIAGNOSIS — G40909 Epilepsy, unspecified, not intractable, without status epilepticus: Secondary | ICD-10-CM | POA: Diagnosis not present

## 2016-09-06 DIAGNOSIS — N189 Chronic kidney disease, unspecified: Secondary | ICD-10-CM | POA: Insufficient documentation

## 2016-09-06 DIAGNOSIS — D509 Iron deficiency anemia, unspecified: Secondary | ICD-10-CM

## 2016-09-06 DIAGNOSIS — G4700 Insomnia, unspecified: Secondary | ICD-10-CM | POA: Diagnosis not present

## 2016-09-06 DIAGNOSIS — G43711 Chronic migraine without aura, intractable, with status migrainosus: Secondary | ICD-10-CM | POA: Diagnosis not present

## 2016-09-06 MED ORDER — ZOLPIDEM TARTRATE 5 MG PO TABS: ORAL_TABLET | ORAL | 5 refills | Status: DC

## 2016-09-06 MED ORDER — BUTORPHANOL TARTRATE 10 MG/ML NA SOLN: 1.0000 | NASAL | 5 refills | Status: DC | PRN

## 2016-09-06 NOTE — Progress Notes (Signed)
Subjective:    Patient ID: Natalie [name], female    DOB: [birth year], 65 y.o.   MRN: [id number] Chief Complaint  Patient presents with  . Follow-up    3 month    HPI  Natalie [name] is a delightful 65 yo woman who is here for a 3 month follow-up on her chronic medical conditions.  Natalie [name] has had a complicated medical course this year resulting in several hospitalizations for urosepsis stemming from nephrolithiasis. She is following with nephrology. Has consistently had large amount of hematuria. 2 months ago I started her on daily nitrofurantoin for prophylaxis which we tried for one month.  DMII: On glipizide 5 bid.  A1c 2 mos prior 06/23/16 was 8.6. Urine Microalb elev 03/2015 and 07/05/2016.  Optho appt (last in epic 02/16)????? Appt 11/02/15 - Marica Otter Has been taking cbgs qam 130-140 fasting.  Sev hours after dinner at night can get to 130-140. No hypoglycemics.  Rarely > 160. No qhs snacks. Needs foot exam  CKD stage 3: stable.  HTN: On norvasc 5, checks occ  Hypothyroid: On levothyroxine 75.  TSH nml 2.5 mos prior.  Sz d/o: stable on phenobarbital. nml folate  HPL: On atorvastatin 20.  Lipids at goal 2 mos prior. LDL 101 with non-hdl 134  Migraine HA: Has only ever responded to stadol nasal spray and uses 1 bottle/wk.   Insomnia: an zolpidem 5-47m qhs prn  IDA with h/o + HOC during recent hospitalization in Sept. Had rbc transfusion on 9/24 with an iron infusion on 9/23 after which ferritin was 93 and was started on oral iron and a ppi. deffered GI for further eval. Unfortunately repeat test was Hemoccult negative 3 in October and hgb was stable at 11.  Vit B12 def: 142 in hosp.  Given 2 inj of vit B12 during hospitalization then transitioned to an oral B12 supp at d/c. Anti-IF nml. Hard to tell if she felt any better after that. Patient is to continue on daily B12 supp.  Past Medical History:  Diagnosis Date  . Chronic kidney disease    kidney stones, right and left    . Diabetes mellitus without complication ([place]   . Headache    migraines  . Hyperlipidemia   . Hypertension   . Iron deficiency anemia   . Renal stone   . Seizures ([place]    none x 30 yrs   Past Surgical History:  Procedure Laterality Date  . CESAREAN SECTION    . CHOLECYSTECTOMY    . KIDNEY STONE SURGERY    . NEPHROLITHOTOMY Right 05/14/2016   Procedure: NEPHROLITHOTOMY PERCUTANEOUS;  Surgeon: SFranchot Gallo MD;  Location: [facility];  Service: Urology;  Laterality: Right;  . SHOULDER ARTHROSCOPY Bilateral    Current Outpatient Prescriptions on File Prior to Visit  Medication Sig Dispense Refill  . glipiZIDE (GLUCOTROL) 5 MG tablet TAKE 1 TABLET TWICE DAILY BEFORE A MEAL 180 tablet 0  . levothyroxine (SYNTHROID, LEVOTHROID) 75 MCG tablet Take 1 tablet (75 mcg total) by mouth daily. 90 tablet 3  . ondansetron (ZOFRAN-ODT) 8 MG disintegrating tablet DISSOLVE 1 TABLET ON OR UNDER THE TONGUE EVERY 8 HOURS AS NEEDED FOR NAUSEA 20 tablet 5  . PHENobarbital (LUMINAL) 97.2 MG tablet Take 1-2 tablets (97.2-194.4 mg total) by mouth daily. TAKE TWO TABLETS BY MOUTH ONCE DAILY ON  MON,  WED,  AND  FRI  AND  1  TABLET  DAILY  ON  OTHER  DAYS 135 tablet 1  . polycarbophil (  FIBERCON) 625 MG tablet Take 625 mg by mouth daily.    . vitamin B-12 1000 MCG tablet Take 1 tablet (1,000 mcg total) by mouth daily. 30 tablet 0  . amLODipine (NORVASC) 5 MG tablet Take 1 tablet (5 mg total) by mouth daily. 90 tablet 1  . atorvastatin (LIPITOR) 20 MG tablet Take 1 tablet (20 mg total) by mouth daily. 90 tablet 3  . ferrous sulfate 325 (65 FE) MG tablet Take 1 tablet (325 mg total) by mouth 2 (two) times daily with a meal. 60 tablet 0  . lactobacillus acidophilus (BACID) TABS tablet Take 2 tablets by mouth 3 (three) times daily.    . meclizine (ANTIVERT) 25 MG tablet Take 1 tablet (25 mg total) by mouth 3 (three) times daily as needed for dizziness. (Patient not taking: Reported on 09/06/2016) 30 tablet 0  .  nitrofurantoin, macrocrystal-monohydrate, (MACROBID) 100 MG capsule Take 1 capsule (100 mg total) by mouth at bedtime. (Patient not taking: Reported on 09/06/2016) 30 capsule 0  . omeprazole (PRILOSEC) 20 MG capsule Take 1 capsule (20 mg total) by mouth 2 (two) times daily before a meal. (Patient not taking: Reported on 09/06/2016) 180 capsule 1  . sulfamethoxazole-trimethoprim (BACTRIM) 400-80 MG tablet Take 1 tablet by mouth daily. (Patient not taking: Reported on 09/06/2016) 30 tablet 2  . terconazole (TERAZOL 3) 0.8 % vaginal cream Place 1 applicator vaginally at bedtime. 3 times a week. (Patient not taking: Reported on 09/06/2016) 20 g 11   Current Facility-Administered Medications on File Prior to Visit  Medication Dose Route Frequency Provider Last Rate Last Dose  . acetaminophen (TYLENOL) tablet 1,000 mg  1,000 mg Oral Once Wendie Agreste, MD       Allergies  Allergen Reactions  . Compazine     Bad headache, vomiting   . Cymbalta [Duloxetine Hcl]     unknown  . Escitalopram Oxalate Other (See Comments)    Can't sleep  . Methadone Hives  . Nitrofuran Derivatives     unknown  . Macrobid [Nitrofurantoin Monohyd Macro] Rash   Family History  Problem Relation Age of Onset  . Heart disease Father   . Diabetes Father    Social History   Social History  . Marital status: Married    Spouse name: N/A  . Number of children: N/A  . Years of education: N/A   Social History Main Topics  . Smoking status: Never Smoker  . Smokeless tobacco: Never Used  . Alcohol use No  . Drug use: No  . Sexual activity: Not Asked   Other Topics Concern  . None   Social History Narrative  . None   Depression screen [facility] 2/9 09/06/2016 07/12/2016 07/05/2016 06/21/2016 06/07/2016  Decreased Interest 0 0 0 0 0  Down, Depressed, Hopeless 0 0 0 0 0  PHQ - 2 Score 0 0 0 0 0    Review of Systems See hpi    Objective:   Physical Exam  Constitutional: She is oriented to person, place, and time. She  appears well-developed and well-nourished. No distress.  HENT:  Head: Normocephalic and atraumatic.  Right Ear: External ear normal.  Left Ear: External ear normal.  Eyes: Conjunctivae are normal. No scleral icterus.  Neck: Normal range of motion. Neck supple. No thyromegaly present.  Cardiovascular: Normal rate, regular rhythm, normal heart sounds and intact distal pulses.   Pulmonary/Chest: Effort normal and breath sounds normal. No respiratory distress.  Abdominal: Normal appearance. There is no CVA tenderness.  Musculoskeletal: She exhibits no edema.  Lymphadenopathy:    She has no cervical adenopathy.  Neurological: She is alert and oriented to person, place, and time.  Skin: Skin is warm and dry. She is not diaphoretic. No erythema.  Psychiatric: She has a normal mood and affect. Her behavior is normal.      Diabetic Foot Exam - Simple   Simple Foot Form Diabetic Foot exam was performed with the following findings:  Yes 09/06/2016  2:03 PM  Visual Inspection No deformities, no ulcerations, no other skin breakdown bilaterally:  Yes Sensation Testing Intact to touch and monofilament testing bilaterally:  Yes Pulse Check Posterior Tibialis and Dorsalis pulse intact bilaterally:  Yes Comments        Assessment & Plan:  Foot exam done today Recheck B12 and iron. Cbc. Cmp.  She ran out and stopped checking wk of thanksgiving.   eGFR 50 BMP as calcium was mildly elevated prior. May be due to recent renal dysfunction. Consider checking PTH and vitamin D. Tsh,  A1c????? Has been in the hosp and ill, only 2 mos since last   Needs AW[facility] medicare exam - none in WELL over a year Foot exam nml.l  1. Type 2 diabetes mellitus with stage 3 chronic kidney disease, without long-term current use of insulin (HCC)   2. Stage 3 chronic kidney disease   3. Essential hypertension   4. Other specified hypothyroidism   5. Seizure disorder ([place])   6. Migraine, chronic, without aura,  intractable, with status migrainosus   7. Hyperlipidemia, unspecified hyperlipidemia type   8. Insomnia, unspecified type   9. Vitamin B12 deficiency (dietary) anemia   10. Iron deficiency anemia, unspecified iron deficiency anemia type   11. Hypercalcemia     Orders Placed This Encounter  Procedures  . CBC  . Comprehensive metabolic panel  . Vitamin B12  . VITAMIN D 25 Hydroxy (Vit-D Deficiency, Fractures)  . Parathyroid hormone, intact (no Ca)  . Ferritin  . Iron and TIBC  . TSH  . HM DIABETES FOOT EXAM    Meds ordered this encounter  Medications  . butorphanol (STADOL) 10 MG/ML nasal spray    Sig: Place 1 spray into the nose every 4 (four) hours as needed for migraine. May refill weekly.    Dispense:  2.5 mL    Refill:  5  . zolpidem (AMBIEN) 5 MG tablet    Sig: take 1 tablet by mouth at bedtime for sleep if needed. May repeat x 1 in 1 hr if needed    Dispense:  60 tablet    Refill:  5     Delman Cheadle, M.D.  [facility] [address] [place], [place] [address] [phone] phone [phone] fax  10/05/16 9:10 PM

## 2016-09-06 NOTE — Patient Instructions (Signed)
     IF you received an x-ray today, you will receive an invoice from [facility]. Please contact [facility] at [phone] with questions or concerns regarding your invoice.   IF you received labwork today, you will receive an invoice from [facility]. Please contact [facility] at [phone] with questions or concerns regarding your invoice.   Our billing staff will not be able to assist you with questions regarding bills from these companies.  You will be contacted with the lab results as soon as they are available. The fastest way to get your results is to activate your My Chart account. Instructions are located on the last page of this paperwork. If you have not heard from us regarding the results in 2 weeks, please contact this office.      

## 2016-09-07 LAB — COMPREHENSIVE METABOLIC PANEL
A/G RATIO: 1.8 (ref 1.2–2.2)
ALK PHOS: 84 IU/L (ref 39–117)
ALT: 21 IU/L (ref 0–32)
AST: 20 IU/L (ref 0–40)
Albumin: 4.8 g/dL (ref 3.6–4.8)
BUN/Creatinine Ratio: 31 — ABNORMAL HIGH (ref 12–28)
BUN: 37 mg/dL — ABNORMAL HIGH (ref 8–27)
CHLORIDE: 100 mmol/L (ref 96–106)
CO2: 24 mmol/L (ref 18–29)
Calcium: 10.6 mg/dL — ABNORMAL HIGH (ref 8.7–10.3)
Creatinine, Ser: 1.21 mg/dL — ABNORMAL HIGH (ref 0.57–1.00)
GFR calc Af Amer: 53 mL/min/{1.73_m2} — ABNORMAL LOW (ref >59)
GFR calc non Af Amer: 46 mL/min/{1.73_m2} — ABNORMAL LOW (ref >59)
Globulin, Total: 2.6 g/dL (ref 1.5–4.5)
Glucose: 109 mg/dL — ABNORMAL HIGH (ref 65–99)
POTASSIUM: 5.1 mmol/L (ref 3.5–5.2)
Sodium: 142 mmol/L (ref 134–144)
Total Protein: 7.4 g/dL (ref 6.0–8.5)

## 2016-09-07 LAB — PARATHYROID HORMONE, INTACT (NO CA): PTH: 39 pg/mL (ref 15–65)

## 2016-09-07 LAB — CBC
Hematocrit: 38.4 % (ref 34.0–46.6)
Hemoglobin: 12.9 g/dL (ref 11.1–15.9)
MCH: 31 pg (ref 26.6–33.0)
MCHC: 33.6 g/dL (ref 31.5–35.7)
MCV: 92 fL (ref 79–97)
PLATELETS: 308 10*3/uL (ref 150–379)
RBC: 4.16 x10E6/uL (ref 3.77–5.28)
RDW: 18.9 % — AB (ref 12.3–15.4)
WBC: 6.9 10*3/uL (ref 3.4–10.8)

## 2016-09-07 LAB — TSH: TSH: 2.08 u[IU]/mL (ref 0.450–4.500)

## 2016-09-07 LAB — VITAMIN B12

## 2016-09-07 LAB — IRON AND TIBC
IRON: 100 ug/dL (ref 27–139)
Iron Saturation: 30 % (ref 15–55)
Total Iron Binding Capacity: 328 ug/dL (ref 250–450)
UIBC: 228 ug/dL (ref 118–369)

## 2016-09-07 LAB — FERRITIN: Ferritin: 51 ng/mL (ref 15–150)

## 2016-09-07 LAB — VITAMIN D 25 HYDROXY (VIT D DEFICIENCY, FRACTURES): VIT D 25 HYDROXY: 37.4 ng/mL (ref 30.0–100.0)

## 2016-10-08 ENCOUNTER — Encounter: Payer: Self-pay | Admitting: Family Medicine

## 2016-10-08 ENCOUNTER — Ambulatory Visit (INDEPENDENT_AMBULATORY_CARE_PROVIDER_SITE_OTHER): Payer: Medicare Other | Admitting: Family Medicine

## 2016-10-08 VITALS — BP 143/78 | HR 98 | Temp 99.3°F | Resp 16 | Ht <= 58 in | Wt 157.0 lb

## 2016-10-08 DIAGNOSIS — Z5181 Encounter for therapeutic drug level monitoring: Secondary | ICD-10-CM | POA: Diagnosis not present

## 2016-10-08 DIAGNOSIS — E038 Other specified hypothyroidism: Secondary | ICD-10-CM | POA: Diagnosis not present

## 2016-10-08 DIAGNOSIS — I10 Essential (primary) hypertension: Secondary | ICD-10-CM

## 2016-10-08 DIAGNOSIS — E785 Hyperlipidemia, unspecified: Secondary | ICD-10-CM

## 2016-10-08 DIAGNOSIS — G40909 Epilepsy, unspecified, not intractable, without status epilepticus: Secondary | ICD-10-CM

## 2016-10-08 DIAGNOSIS — G43711 Chronic migraine without aura, intractable, with status migrainosus: Secondary | ICD-10-CM

## 2016-10-08 DIAGNOSIS — E1129 Type 2 diabetes mellitus with other diabetic kidney complication: Secondary | ICD-10-CM | POA: Diagnosis not present

## 2016-10-08 MED ORDER — BUTORPHANOL TARTRATE 10 MG/ML NA SOLN: 1.0000 | NASAL | 5 refills | Status: DC | PRN

## 2016-10-08 MED ORDER — TERCONAZOLE 0.8 % VA CREA: 1.0000 | TOPICAL_CREAM | Freq: Every day | VAGINAL | 0 refills | Status: DC

## 2016-10-08 NOTE — Progress Notes (Signed)
Subjective:    Patient ID: Natalie [name], female    DOB: September 16, 1951, 66 y.o.   MRN: [id number] Chief Complaint  Patient presents with  . Medication Refill    all meds    HPI  THey transferred her [name]. - [facility] Only has 1 refill on her stadol They shut her account down before she could get the cream  Has appt to f/u with neprhology 1/26 for routine check-u - 3 mos  Her blood sguars are about the same - in the mornings up to 130s but will droip when she doesn't eat. Lowest is 59 and she feels the hypoglycemic episodes immed. She is having that about 1x/wk.  DUring the day 120-130s  Depression screen [facility] 2/9 09/06/2016 07/12/2016 07/05/2016 06/21/2016 06/07/2016  Decreased Interest 0 0 0 0 0  Down, Depressed, Hopeless 0 0 0 0 0  PHQ - 2 Score 0 0 0 0 0   Past Medical History:  Diagnosis Date  . Chronic kidney disease    kidney stones, right and left  . Diabetes mellitus without complication ([place])   . Headache    migraines  . Hyperlipidemia   . Hypertension   . Iron deficiency anemia   . Renal stone   . Seizures ([place])    none x 30 yrs   Past Surgical History:  Procedure Laterality Date  . CESAREAN SECTION    . CHOLECYSTECTOMY    . KIDNEY STONE SURGERY    . NEPHROLITHOTOMY Right 05/14/2016   Procedure: NEPHROLITHOTOMY PERCUTANEOUS;  Surgeon: Franchot Gallo, MD;  Location: [facility];  Service: Urology;  Laterality: Right;  . SHOULDER ARTHROSCOPY Bilateral    Current Outpatient Prescriptions on File Prior to Visit  Medication Sig Dispense Refill  . lactobacillus acidophilus (BACID) TABS tablet Take 2 tablets by mouth 3 (three) times daily.    . ondansetron (ZOFRAN-ODT) 8 MG disintegrating tablet DISSOLVE 1 TABLET ON OR UNDER THE TONGUE EVERY 8 HOURS AS NEEDED FOR NAUSEA 20 tablet 5  . polycarbophil (FIBERCON) 625 MG tablet Take 625 mg by mouth daily.    . vitamin B-12 1000 MCG tablet Take 1 tablet (1,000 mcg total) by mouth daily. 30 tablet 0  .  zolpidem (AMBIEN) 5 MG tablet take 1 tablet by mouth at bedtime for sleep if needed. May repeat x 1 in 1 hr if needed 60 tablet 5  . omeprazole (PRILOSEC) 20 MG capsule Take 1 capsule (20 mg total) by mouth 2 (two) times daily before a meal. (Patient not taking: Reported on 10/08/2016) 180 capsule 1   Current Facility-Administered Medications on File Prior to Visit  Medication Dose Route Frequency Provider Last Rate Last Dose  . acetaminophen (TYLENOL) tablet 1,000 mg  1,000 mg Oral Once Wendie Agreste, MD       Allergies  Allergen Reactions  . Compazine     Bad headache, vomiting   . Cymbalta [Duloxetine Hcl]     unknown  . Escitalopram Oxalate Other (See Comments)    Can't sleep  . Methadone Hives  . Nitrofuran Derivatives     unknown  . Macrobid [Nitrofurantoin Monohyd Macro] Rash   Family History  Problem Relation Age of Onset  . Heart disease Father   . Diabetes Father    Social History   Social History  . Marital status: Married    Spouse name: N/A  . Number of children: N/A  . Years of education: N/A   Social History  Main Topics  . Smoking status: Never Smoker  . Smokeless tobacco: Never Used  . Alcohol use No  . Drug use: No  . Sexual activity: Not Asked   Other Topics Concern  . None   Social History Narrative  . None    Review of Systems  Constitutional: Negative for activity change, appetite change, chills, diaphoresis and fever.  Eyes: Negative for visual disturbance.  Respiratory: Negative for cough and shortness of breath.   Cardiovascular: Negative for chest pain, palpitations and leg swelling.  Gastrointestinal: Negative for vomiting.  Genitourinary: Negative for decreased urine volume, difficulty urinating and flank pain.  Neurological: Positive for headaches. Negative for syncope.  Hematological: Does not bruise/bleed easily.  Psychiatric/Behavioral: Positive for sleep disturbance.       Objective:   Physical Exam  Constitutional: She is  oriented to person, place, and time. She appears well-developed and well-nourished. No distress.  HENT:  Head: Normocephalic and atraumatic.  Right Ear: External ear normal.  Left Ear: External ear normal.  Eyes: Conjunctivae are normal. No scleral icterus.  Neck: Normal range of motion. Neck supple. No thyromegaly present.  Cardiovascular: Normal rate, regular rhythm, normal heart sounds and intact distal pulses.   Pulmonary/Chest: Effort normal and breath sounds normal. No respiratory distress.  Musculoskeletal: She exhibits no edema.  Lymphadenopathy:    She has no cervical adenopathy.  Neurological: She is alert and oriented to person, place, and time.  Skin: Skin is warm and dry. She is not diaphoretic. No erythema.  Psychiatric: She has a normal mood and affect. Her behavior is normal.     BP (!) 143/78   Pulse 98   Temp 99.3 F (37.4 C) (Oral)   Resp 16   Ht 4\' 9"  (1.448 m)   Wt 157 lb (71.2 kg)   SpO2 95%   BMI 33.97 kg/m      Assessment & Plan:  Send terconazxol and all other meds to the  Phenobarb and all others need to be sent to [facility] She will get ambien from [employer] if needs zofran, ppi, meclizine,  Will rtc in 3 mos for labs -  a1c  1. Type 2 diabetes mellitus with other diabetic kidney complication, without long-term current use of insulin ([place])   2. Essential hypertension   3. Other specified hypothyroidism   4. Migraine, chronic, without aura, intractable, with status migrainosus   5. Seizure disorder ([place])   6. Hyperlipidemia, unspecified hyperlipidemia type   7. Medication monitoring encounter     Meds ordered this encounter  Medications  . butorphanol (STADOL) 10 MG/ML nasal spray    Sig: Place 1 spray into the nose every 4 (four) hours as needed for migraine. May refill weekly.    Dispense:  2.5 mL    Refill:  5  . DISCONTD: terconazole (TERAZOL 3) 0.8 % vaginal cream    Sig: Place 1 applicator vaginally at bedtime. 3 times a  week.    Dispense:  20 g    Refill:  0  . terconazole (TERAZOL 3) 0.8 % vaginal cream    Sig: Place 1 applicator vaginally at bedtime. 3 times a week.    Dispense:  80 g    Refill:  3  . PHENobarbital (LUMINAL) 97.2 MG tablet    Sig: Take 1-2 tablets (97.2-194.4 mg total) by mouth daily. TAKE TWO TABLETS BY MOUTH ONCE DAILY ON  MON,  WED,  AND  FRI  AND  1  TABLET  DAILY  ON  OTHER  DAYS    Dispense:  135 tablet    Refill:  1    Please fax to [facility].  Marland Kitchen levothyroxine (SYNTHROID, LEVOTHROID) 75 MCG tablet    Sig: Take 1 tablet (75 mcg total) by mouth daily.    Dispense:  90 tablet    Refill:  3  . glipiZIDE (GLUCOTROL) 5 MG tablet    Sig: TAKE 1 TABLET TWICE DAILY BEFORE A MEAL    Dispense:  180 tablet    Refill:  1  . amLODipine (NORVASC) 5 MG tablet    Sig: Take 1 tablet (5 mg total) by mouth daily.    Dispense:  90 tablet    Refill:  3  . atorvastatin (LIPITOR) 20 MG tablet    Sig: Take 1 tablet (20 mg total) by mouth daily.    Dispense:  90 tablet    Refill:  3      Delman Cheadle, M.D.  [facility] [address] [place], [place] [address] [phone] phone [phone] fax  10/09/16 11:33 AM

## 2016-10-08 NOTE — Patient Instructions (Signed)
     IF you received an x-ray today, you will receive an invoice from [facility]. Please contact [facility] at [phone] with questions or concerns regarding your invoice.   IF you received labwork today, you will receive an invoice from [facility]. Please contact [facility] at [phone] with questions or concerns regarding your invoice.   Our billing staff will not be able to assist you with questions regarding bills from these companies.  You will be contacted with the lab results as soon as they are available. The fastest way to get your results is to activate your My Chart account. Instructions are located on the last page of this paperwork. If you have not heard from us regarding the results in 2 weeks, please contact this office.     

## 2016-10-09 MED ORDER — LEVOTHYROXINE SODIUM 75 MCG PO TABS: 75.0000 ug | ORAL_TABLET | Freq: Every day | ORAL | 3 refills | Status: DC

## 2016-10-09 MED ORDER — TERCONAZOLE 0.8 % VA CREA: 1.0000 | TOPICAL_CREAM | Freq: Every day | VAGINAL | 3 refills | Status: DC

## 2016-10-09 MED ORDER — GLIPIZIDE 5 MG PO TABS: ORAL_TABLET | ORAL | 1 refills | Status: DC

## 2016-10-09 MED ORDER — AMLODIPINE BESYLATE 5 MG PO TABS: 5.0000 mg | ORAL_TABLET | Freq: Every day | ORAL | 3 refills | Status: DC

## 2016-10-09 MED ORDER — PHENOBARBITAL 97.2 MG PO TABS: 97.2000 mg | ORAL_TABLET | Freq: Every day | ORAL | 1 refills | Status: DC

## 2016-10-09 MED ORDER — ATORVASTATIN CALCIUM 20 MG PO TABS: 20.0000 mg | ORAL_TABLET | Freq: Every day | ORAL | 3 refills | Status: DC

## 2016-10-15 ENCOUNTER — Telehealth: Payer: Self-pay

## 2016-10-15 NOTE — Telephone Encounter (Signed)
Pt needs printed rx from 10/08/16 visits re printed and sent to [facility].  I donot see the hard copies here.

## 2016-10-16 NOTE — Telephone Encounter (Signed)
So her [facility]b [facility] last Mon 1/8 when I saw her. The stadol and other controlled drug she fills locally and did not want to change. Other meds were sent to [facility] by [facility].  Did [facility] not get something?  Is it just the phenobarb?

## 2016-10-18 ENCOUNTER — Ambulatory Visit: Payer: Medicare Other | Admitting: Family Medicine

## 2016-10-22 ENCOUNTER — Telehealth: Payer: Self-pay | Admitting: Family Medicine

## 2016-10-22 ENCOUNTER — Ambulatory Visit (INDEPENDENT_AMBULATORY_CARE_PROVIDER_SITE_OTHER): Payer: Medicare Other | Admitting: Family Medicine

## 2016-10-22 ENCOUNTER — Encounter: Payer: Self-pay | Admitting: Family Medicine

## 2016-10-22 VITALS — BP 143/81 | HR 70 | Temp 97.5°F | Ht <= 58 in | Wt 158.2 lb

## 2016-10-22 DIAGNOSIS — Z5181 Encounter for therapeutic drug level monitoring: Secondary | ICD-10-CM | POA: Diagnosis not present

## 2016-10-22 MED ORDER — LEVOTHYROXINE SODIUM 75 MCG PO TABS: 75.0000 ug | ORAL_TABLET | Freq: Every day | ORAL | 3 refills | Status: DC

## 2016-10-22 MED ORDER — ATORVASTATIN CALCIUM 20 MG PO TABS: 20.0000 mg | ORAL_TABLET | Freq: Every day | ORAL | 3 refills | Status: DC

## 2016-10-22 MED ORDER — AMLODIPINE BESYLATE 5 MG PO TABS: 5.0000 mg | ORAL_TABLET | Freq: Every day | ORAL | 3 refills | Status: DC

## 2016-10-22 MED ORDER — GLIPIZIDE 5 MG PO TABS: ORAL_TABLET | ORAL | 1 refills | Status: DC

## 2016-10-22 MED ORDER — TERCONAZOLE 0.8 % VA CREA: 1.0000 | TOPICAL_CREAM | Freq: Every day | VAGINAL | 3 refills | Status: DC

## 2016-10-22 MED ORDER — PHENOBARBITAL 97.2 MG PO TABS: 97.2000 mg | ORAL_TABLET | Freq: Every day | ORAL | 1 refills | Status: DC

## 2016-10-22 NOTE — Telephone Encounter (Signed)
I sent in a high priority ticket on 1/16 as poor pt was stating the [facility] had not received any of her medicines of which had been e-rxed on 1/8 and the phenobarb faxed - was needing someone to call e-rx and see why they were telling pt there was no record of her medication. Pt came in and asked Santiago Glad about it.  She has still not heard anything and is now having to make really a 3rd OV (12/5, 18, 1/22) just to get her meds send in to e-rx.

## 2016-10-22 NOTE — Progress Notes (Signed)
Subjective:    Patient ID: Natalie [name], female    DOB: [birth year], 66 y.o.   MRN: [id number] Chief Complaint  Patient presents with  . Medication Refill    f/u    HPI Has appt to f/u with neprhology 1/26 for routine check-u - 3 mos'  cbgs   [facility] [place] [facility] (prior [place] transferred it here). She will call if she needs zofran, ppi, meclizine.  She gets the Stadol locally.  She is no longer eligile.  She needs to contact her provider who got her insruance  Past Medical History:  Diagnosis Date  . Chronic kidney disease    kidney stones, right and left  . Diabetes mellitus without complication ([place])   . Headache    migraines  . Hyperlipidemia   . Hypertension   . Iron deficiency anemia   . Renal stone   . Seizures ([place])    none x 30 yrs   Current Outpatient Prescriptions on File Prior to Visit  Medication Sig Dispense Refill  . lactobacillus acidophilus (BACID) TABS tablet Take 2 tablets by mouth 3 (three) times daily.    Marland Kitchen omeprazole (PRILOSEC) 20 MG capsule Take 1 capsule (20 mg total) by mouth 2 (two) times daily before a meal. 180 capsule 1  . polycarbophil (FIBERCON) 625 MG tablet Take 625 mg by mouth daily.    . vitamin B-12 1000 MCG tablet Take 1 tablet (1,000 mcg total) by mouth daily. 30 tablet 0  . zolpidem (AMBIEN) 5 MG tablet take 1 tablet by mouth at bedtime for sleep if needed. May repeat x 1 in 1 hr if needed 60 tablet 5   Current Facility-Administered Medications on File Prior to Visit  Medication Dose Route Frequency Provider Last Rate Last Dose  . acetaminophen (TYLENOL) tablet 1,000 mg  1,000 mg Oral Once Wendie Agreste, MD       Allergies  Allergen Reactions  . Compazine     Bad headache, vomiting   . Cymbalta [Duloxetine Hcl]     unknown  . Escitalopram Oxalate Other (See Comments)    Can't sleep  . Methadone Hives  . Nitrofuran Derivatives     unknown  . Macrobid [Nitrofurantoin Monohyd Macro] Rash    Depression screen [facility] 2/9 10/22/2016 09/06/2016 07/12/2016 07/05/2016 06/21/2016  Decreased Interest 0 0 0 0 0  Down, Depressed, Hopeless 0 0 0 0 0  PHQ - 2 Score 0 0 0 0 0    Review of Systems See hpi    Objective:   Physical Exam  Constitutional: She is oriented to person, place, and time. She appears well-developed and well-nourished. No distress.  HENT:  Head: Normocephalic and atraumatic.  Right Ear: External ear normal.  Eyes: Conjunctivae are normal. No scleral icterus.  Pulmonary/Chest: Effort normal.  Neurological: She is alert and oriented to person, place, and time.  Skin: Skin is warm and dry. She is not diaphoretic. No erythema.  Psychiatric: She has a normal mood and affect. Her behavior is normal.      BP (!) 143/81 (BP Location: Right Arm, Patient Position: Sitting, Cuff Size: Small)   Pulse 70   Temp 97.5 F (36.4 C) (Oral)   Ht 4\' 9"  (1.448 m)   Wt 158 lb 3.2 oz (71.8 kg)   SpO2 99%   BMI 34.23 kg/m      Assessment & Plan:   1. Medication monitoring encounter   Pt's medicare supp changed  at the beginning of the year and she needs all of her medications sent to her mail order pharmacy which we did at her last visit and again when she called stating that they didn't have them.  I sent them for a 3rd time and then called [facility]. They do not have pt authorized to use their pharmacy - apparently they require notification from pt's supplemental ins that her account is activated which they have not received so they have just kept canceling the e-rxs and then telling pt that there is nothing on file when she calls to ask.  [facility]!  They again cancelled those received today. Pt will contact the business that she purchased the supplemental through and then call us to resend whenever the mail-in pharm has been authorized.  Meds ordered this encounter  Medications  . DISCONTD: levothyroxine (SYNTHROID, LEVOTHROID) 75 MCG tablet    Sig: Take 1 tablet (75 mcg total)  by mouth daily.    Dispense:  90 tablet    Refill:  3  . DISCONTD: glipiZIDE (GLUCOTROL) 5 MG tablet    Sig: TAKE 1 TABLET TWICE DAILY BEFORE A MEAL    Dispense:  180 tablet    Refill:  1  . DISCONTD: atorvastatin (LIPITOR) 20 MG tablet    Sig: Take 1 tablet (20 mg total) by mouth daily.    Dispense:  90 tablet    Refill:  3  . DISCONTD: amLODipine (NORVASC) 5 MG tablet    Sig: Take 1 tablet (5 mg total) by mouth daily.    Dispense:  90 tablet    Refill:  3  . DISCONTD: terconazole (TERAZOL 3) 0.8 % vaginal cream    Sig: Place 1 applicator vaginally at bedtime. 3 times a week.    Dispense:  80 g    Refill:  3  . PHENobarbital (LUMINAL) 97.2 MG tablet    Sig: Take 1-2 tablets (97.2-194.4 mg total) by mouth daily. TAKE TWO TABLETS BY MOUTH ONCE DAILY ON  MON,  WED,  AND  FRI  AND  1  TABLET  DAILY  ON  OTHER  DAYS    Dispense:  135 tablet    Refill:  1    Please fax to [facility].  . terconazole (TERAZOL 3) 0.8 % vaginal cream    Sig: Place 1 applicator vaginally at bedtime. 3 times a week.    Dispense:  80 g    Refill:  3  . levothyroxine (SYNTHROID, LEVOTHROID) 75 MCG tablet    Sig: Take 1 tablet (75 mcg total) by mouth daily.    Dispense:  90 tablet    Refill:  3  . glipiZIDE (GLUCOTROL) 5 MG tablet    Sig: TAKE 1 TABLET TWICE DAILY BEFORE A MEAL    Dispense:  180 tablet    Refill:  1  . atorvastatin (LIPITOR) 20 MG tablet    Sig: Take 1 tablet (20 mg total) by mouth daily.    Dispense:  90 tablet    Refill:  3  . amLODipine (NORVASC) 5 MG tablet    Sig: Take 1 tablet (5 mg total) by mouth daily.    Dispense:  90 tablet    Refill:  3    Delman Cheadle, M.D.  [facility] at [facility] [address] [place], [place] [address] [phone] phone [phone] fax  11/16/16 8:55 PM

## 2016-10-22 NOTE — Patient Instructions (Signed)
     IF you received an x-ray today, you will receive an invoice from [facility]. Please contact  Radiology at [phone] with questions or concerns regarding your invoice.   IF you received labwork today, you will receive an invoice from [facility]. Please contact [facility] at [phone] with questions or concerns regarding your invoice.   Our billing staff will not be able to assist you with questions regarding bills from these companies.  You will be contacted with the lab results as soon as they are available. The fastest way to get your results is to activate your My Chart account. Instructions are located on the last page of this paperwork. If you have not heard from us regarding the results in 2 weeks, please contact this office.     

## 2016-10-23 NOTE — Telephone Encounter (Signed)
Generic zolpidem quantity limits notice received for l;ocal pharmacy. See note below trying to get [facility] account established now (patient and medicare) This notice was sent to scanning.

## 2016-10-23 NOTE — Telephone Encounter (Signed)
Pt states, [facility] is saying she does not have an account with them. Unsure of what is going on. Medicare Rep to follow up with company to find out the disconnect [facility] Dr[facility] Brigitte Pulse Advised patient to call clinic if we can assist her

## 2016-10-26 ENCOUNTER — Ambulatory Visit: Payer: Medicare Other | Admitting: Internal Medicine

## 2016-10-26 DIAGNOSIS — N200 Calculus of kidney: Secondary | ICD-10-CM | POA: Diagnosis not present

## 2016-11-01 ENCOUNTER — Encounter: Payer: Self-pay | Admitting: Family Medicine

## 2016-11-01 DIAGNOSIS — H01004 Unspecified blepharitis left upper eyelid: Secondary | ICD-10-CM | POA: Diagnosis not present

## 2016-11-01 DIAGNOSIS — Z7984 Long term (current) use of oral hypoglycemic drugs: Secondary | ICD-10-CM | POA: Diagnosis not present

## 2016-11-01 DIAGNOSIS — E119 Type 2 diabetes mellitus without complications: Secondary | ICD-10-CM | POA: Diagnosis not present

## 2016-11-01 DIAGNOSIS — H5213 Myopia, bilateral: Secondary | ICD-10-CM | POA: Diagnosis not present

## 2016-11-01 DIAGNOSIS — H01002 Unspecified blepharitis right lower eyelid: Secondary | ICD-10-CM | POA: Diagnosis not present

## 2016-11-01 DIAGNOSIS — H01001 Unspecified blepharitis right upper eyelid: Secondary | ICD-10-CM | POA: Diagnosis not present

## 2016-11-01 DIAGNOSIS — H01005 Unspecified blepharitis left lower eyelid: Secondary | ICD-10-CM | POA: Diagnosis not present

## 2016-11-01 DIAGNOSIS — H52223 Regular astigmatism, bilateral: Secondary | ICD-10-CM | POA: Diagnosis not present

## 2016-11-01 LAB — HM DIABETES EYE EXAM

## 2016-11-12 DIAGNOSIS — N200 Calculus of kidney: Secondary | ICD-10-CM

## 2016-11-16 ENCOUNTER — Other Ambulatory Visit: Payer: Self-pay

## 2016-11-16 MED ORDER — BUTORPHANOL TARTRATE 10 MG/ML NA SOLN: 1.0000 | NASAL | 5 refills | Status: DC | PRN

## 2016-11-16 MED ORDER — ONDANSETRON 8 MG PO TBDP: ORAL_TABLET | ORAL | 5 refills | Status: DC

## 2016-11-16 NOTE — Telephone Encounter (Signed)
PATIENT WOULD LIKE TO LET DR. SHAW KNOW THAT SHE NEEDS TO GET A REFILL ON HER BUTORPHANOL (STADOL) 10 MG/ML NASAL SPRAY. PLEASE CALL HER WHEN IT CAN BE PICKED UP. SHE ALSO NEEDS ONDANSETRON (ZOFRAN-ODT) 8 MG. SHE SAID SHE GETS A 90 DAY SUPPLY. BEST PHONE [phone] (HOME) [place]

## 2016-11-16 NOTE — Telephone Encounter (Signed)
10/2016 last refill stadol, 05/2016 last zofran

## 2016-11-17 NOTE — Telephone Encounter (Signed)
Pt advised.

## 2016-11-19 ENCOUNTER — Other Ambulatory Visit: Payer: Self-pay

## 2016-11-19 MED ORDER — ONDANSETRON 8 MG PO TBDP: ORAL_TABLET | ORAL | 5 refills | Status: DC

## 2016-11-19 NOTE — Telephone Encounter (Signed)
Refilled 11/16/16 to mail order, will resend to local listed

## 2016-11-19 NOTE — Telephone Encounter (Signed)
Pt requesting her Zophran refill    Phone [phone]   Pharmacy [facility]

## 2016-11-22 DIAGNOSIS — H01004 Unspecified blepharitis left upper eyelid: Secondary | ICD-10-CM | POA: Diagnosis not present

## 2016-11-22 DIAGNOSIS — H01002 Unspecified blepharitis right lower eyelid: Secondary | ICD-10-CM | POA: Diagnosis not present

## 2016-11-22 DIAGNOSIS — H01001 Unspecified blepharitis right upper eyelid: Secondary | ICD-10-CM | POA: Diagnosis not present

## 2016-11-22 DIAGNOSIS — H01005 Unspecified blepharitis left lower eyelid: Secondary | ICD-10-CM | POA: Diagnosis not present

## 2016-11-22 DIAGNOSIS — L209 Atopic dermatitis, unspecified: Secondary | ICD-10-CM | POA: Diagnosis not present

## 2016-11-27 IMAGING — CR DG CHEST 1V PORT
1 series · 1 of 1 positions shown · non-contrast
Comparison: 11/23/2011

CLINICAL DATA: Weakness, diabetes, upper abdominal pain

EXAM:
PORTABLE CHEST - 1 VIEW

[AP]
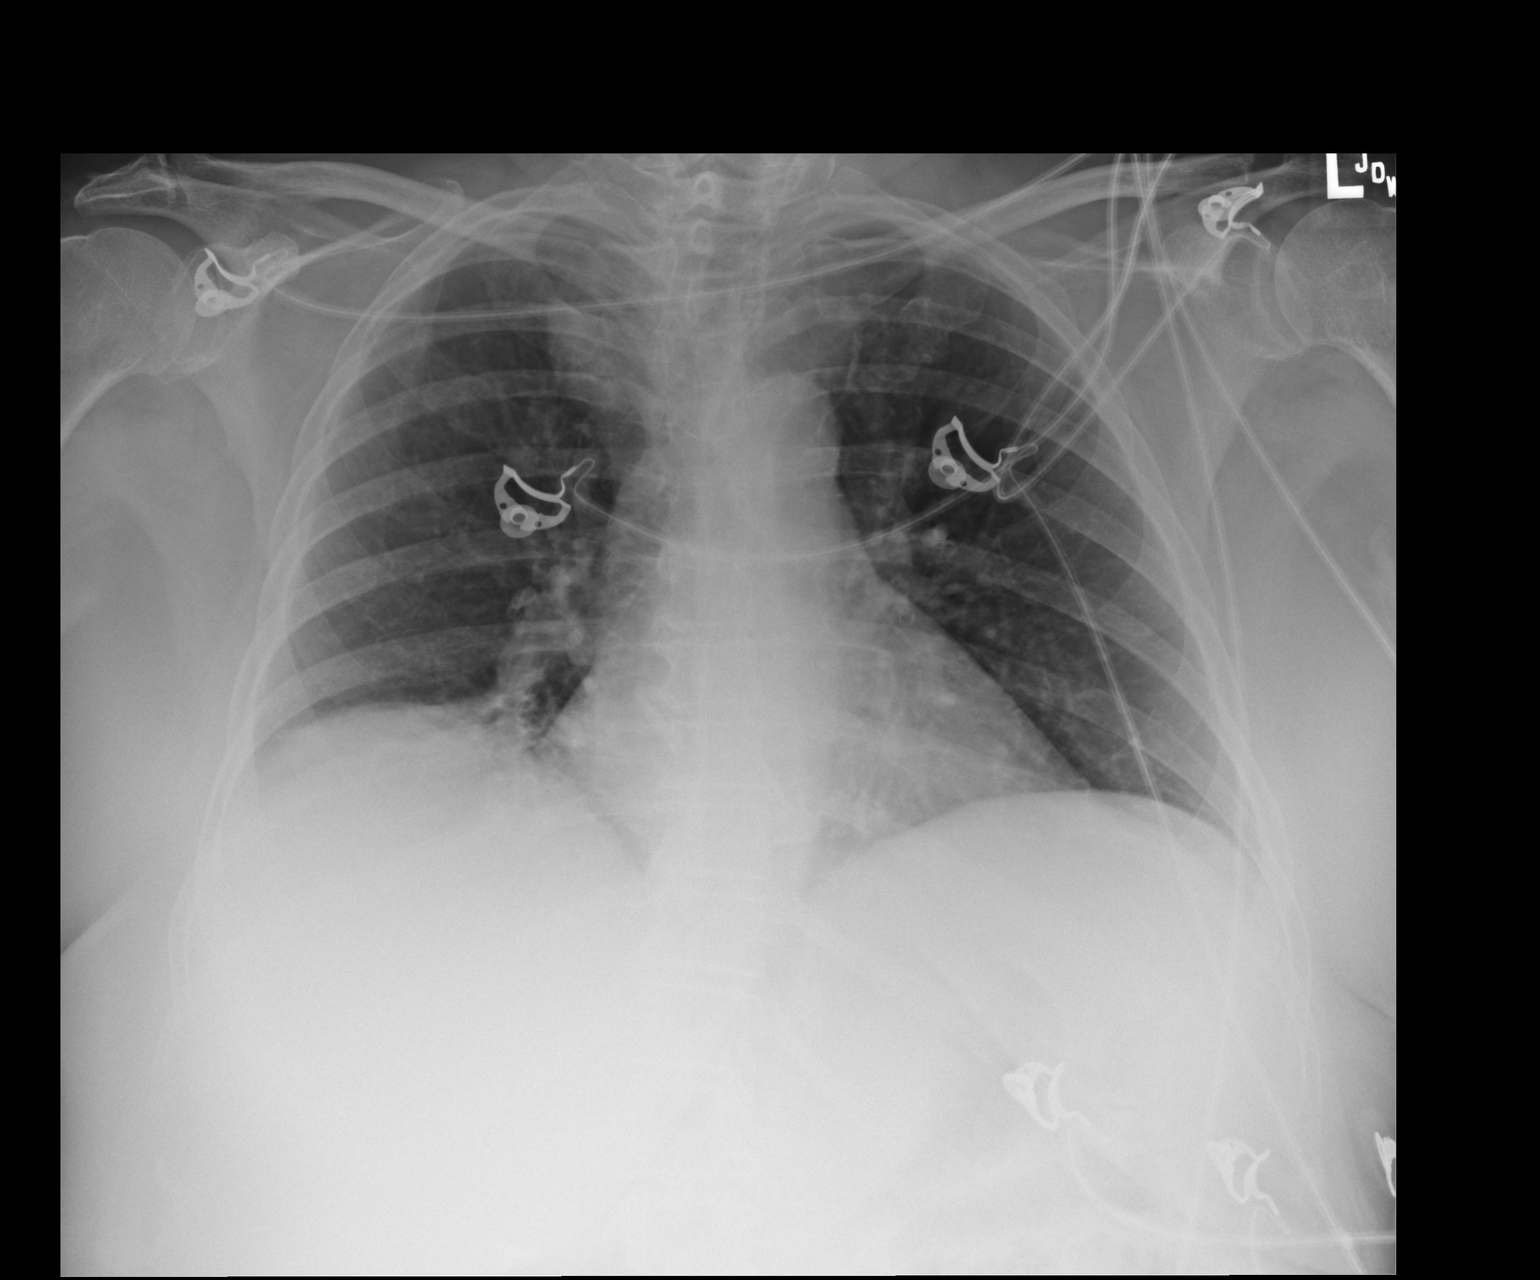

[1 of 1 positions shown; findings below may reference images not displayed]

FINDINGS: Low lung volumes noted with basilar atelectasis and vascular
crowding. This is worse in the right lower lobe. No edema, effusion
or pneumothorax. Stable right paratracheal soft tissue prominence,
suspect vascular. Degenerative changes of the AC joints.
IMPRESSION: Low volume exam with vascular crowding basilar atelectasis.

## 2016-11-28 IMAGING — DX DG ABDOMEN 1V
1 series · 1 of 1 positions shown · non-contrast
Comparison: Radiograph February 25, 2008 ; CT scan January 01, 2011.

CLINICAL DATA: Acute abdominal pain and distention.

EXAM:
ABDOMEN - 1 VIEW

[abdomen supine]
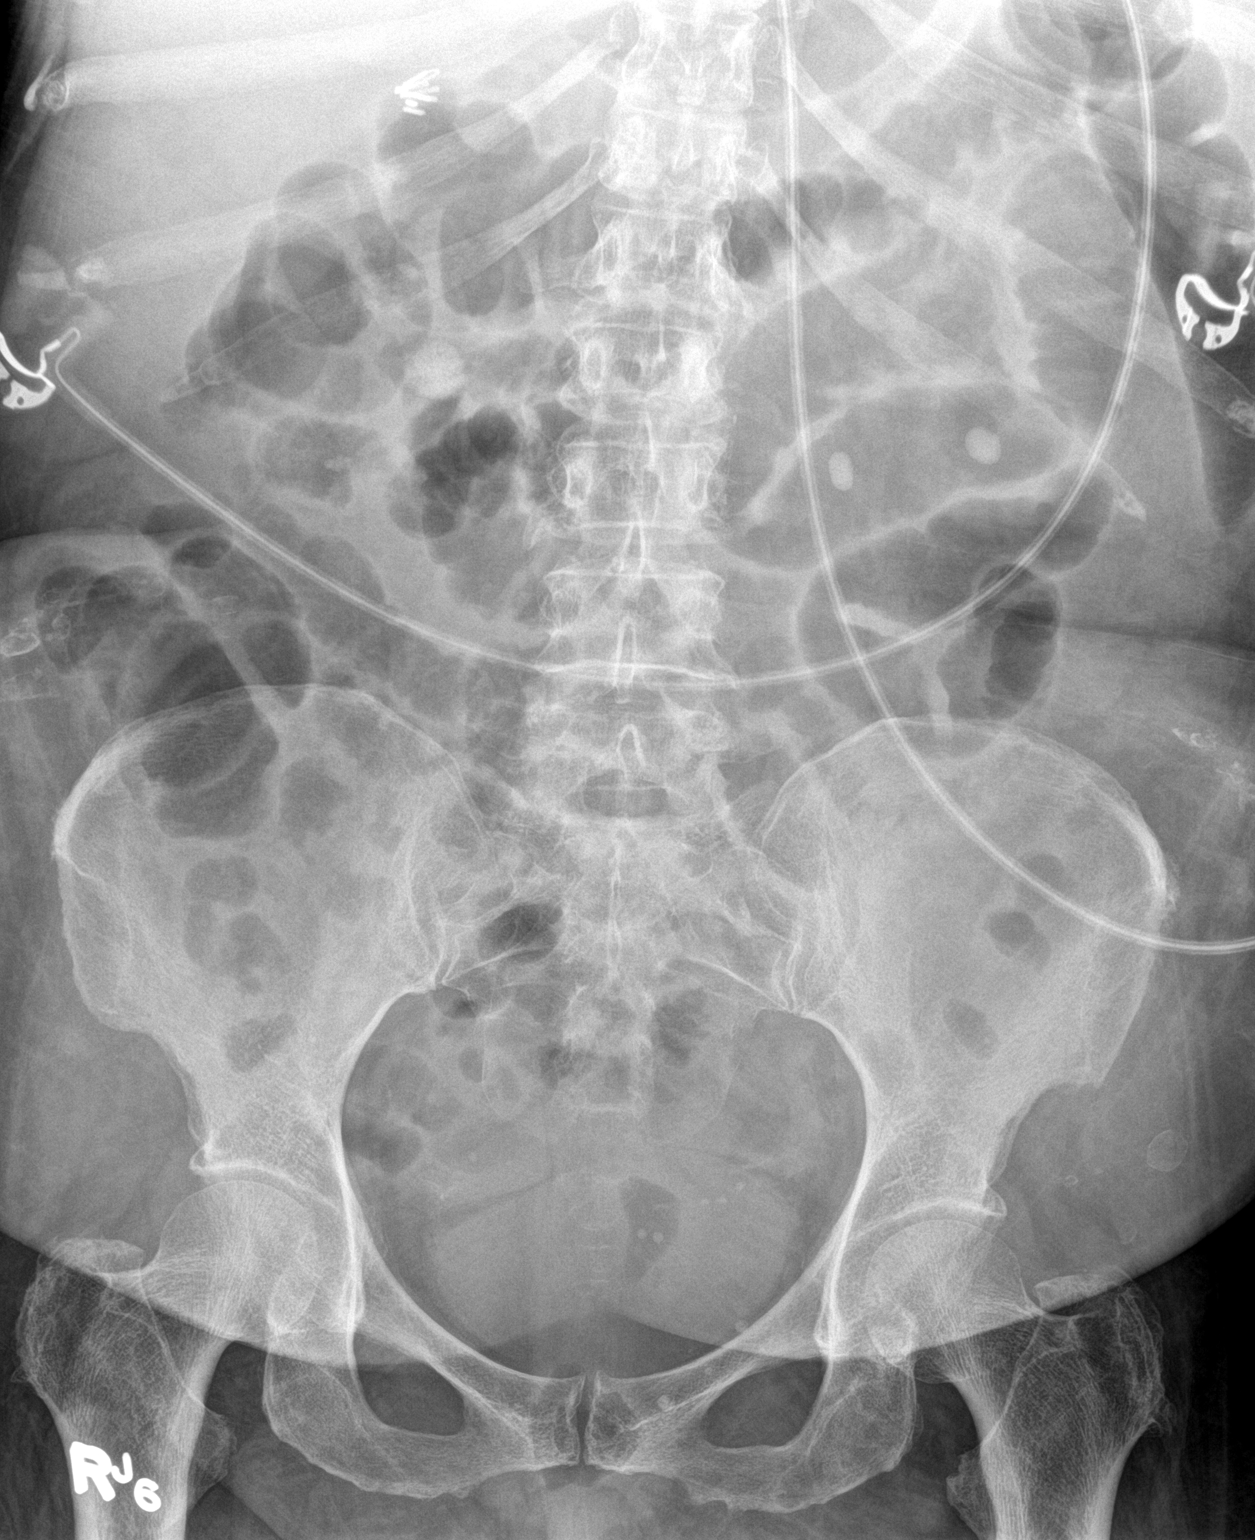

[1 of 1 positions shown; findings below may reference images not displayed]

FINDINGS: No colonic dilatation is noted. Minimally dilated small bowel loops
are noted in the left upper quadrant of the abdomen most consistent
with ileus. Bilateral renal calculi are noted with the largest
measuring 16 mm on the right. Status post cholecystectomy.
Phleboliths are noted in the pelvis.
IMPRESSION: Minimally dilated small bowel loops seen in left upper quadrant of
the abdomen most consistent with ileus. Followup radiographs are
recommended. Bilateral nephrolithiasis is noted as described above.

## 2016-12-03 ENCOUNTER — Other Ambulatory Visit: Payer: Self-pay

## 2016-12-03 IMAGING — CR DG CHEST 2V
2 series · 2 of 2 positions shown · non-contrast
Comparison: 08/31/2014.

CLINICAL DATA: Weakness.

EXAM:
CHEST  2 VIEW

[PA]
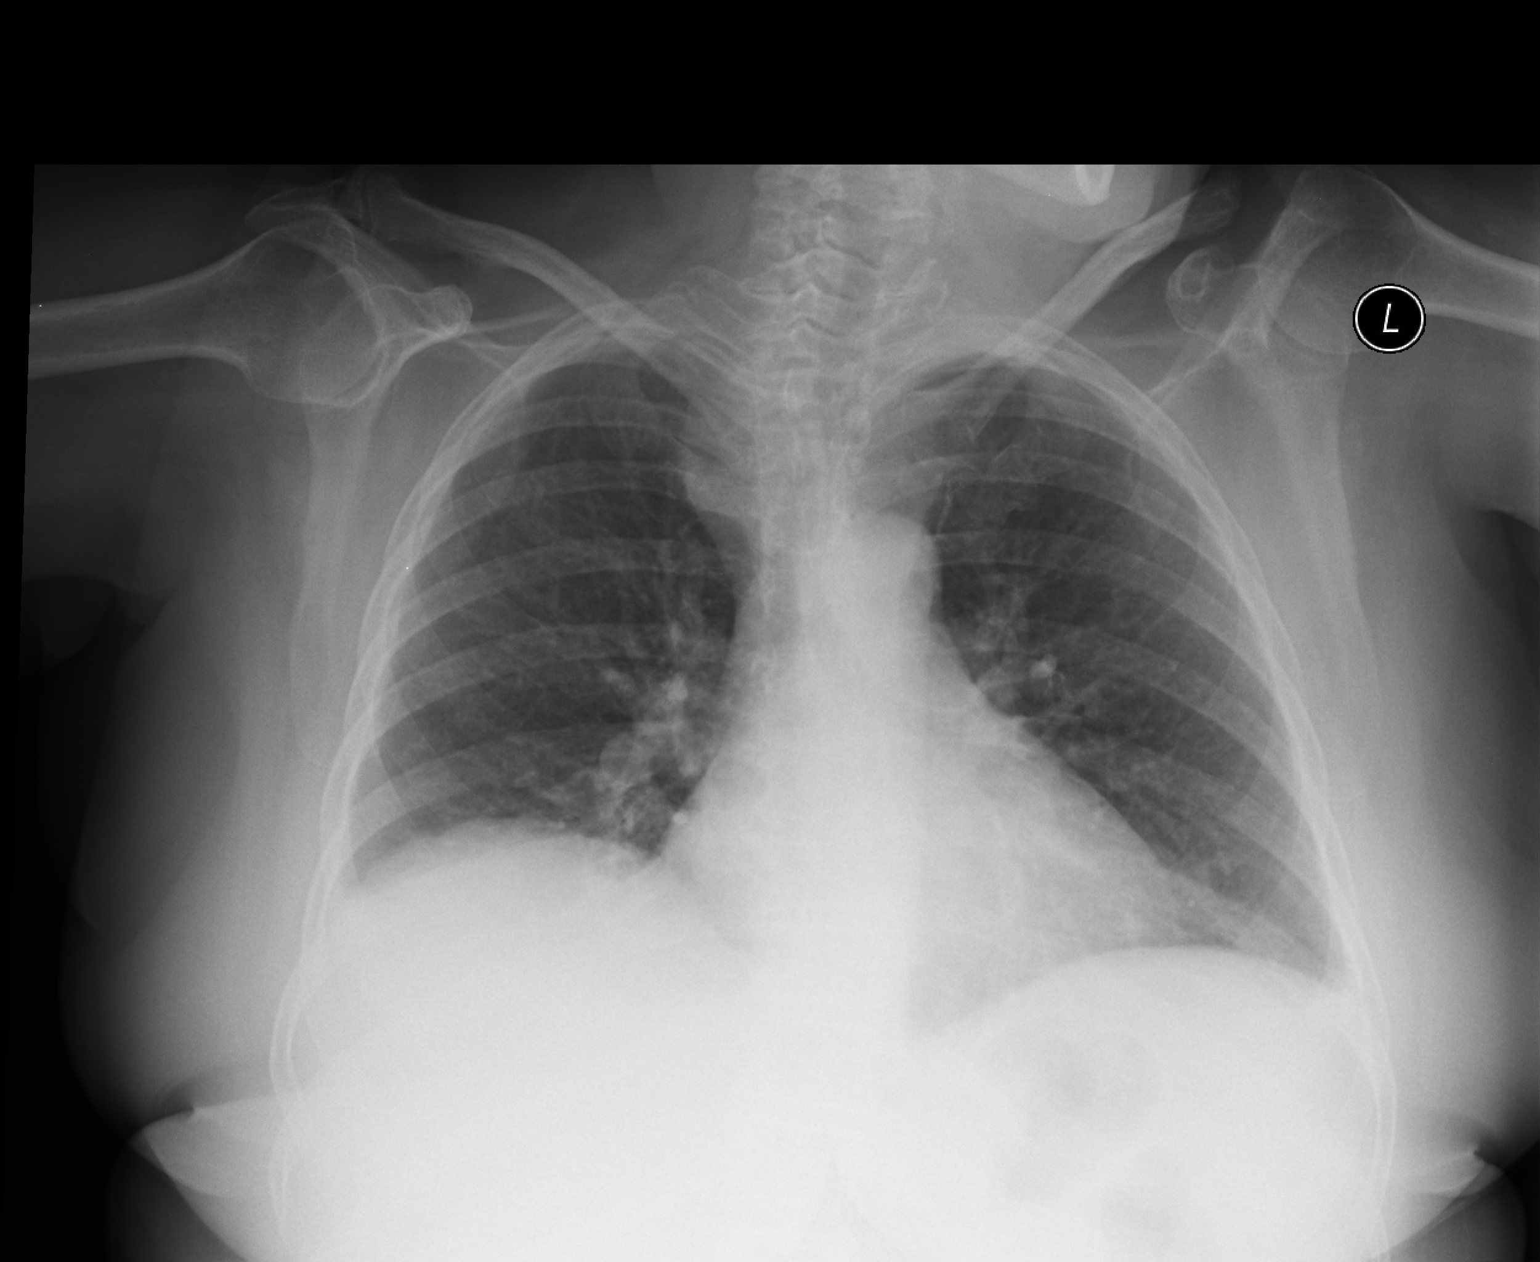

[lateral]
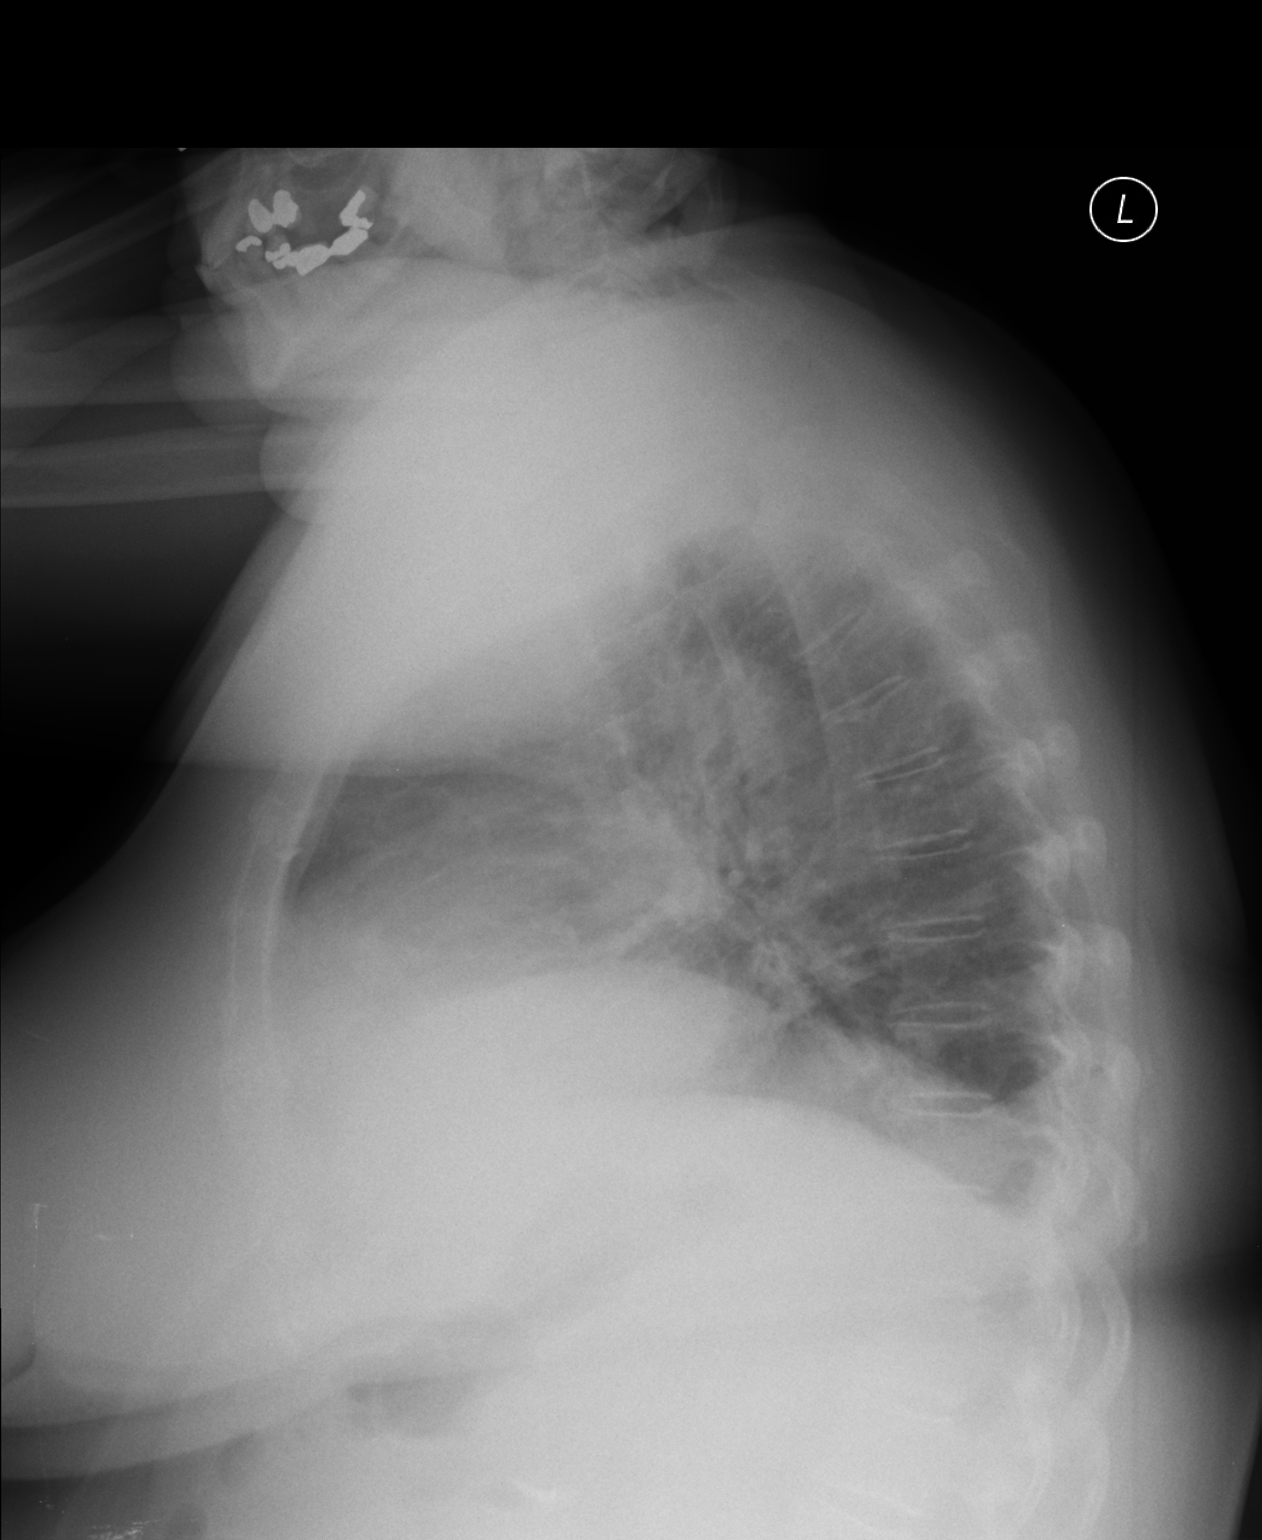

[2 of 2 positions shown; findings below may reference images not displayed]

FINDINGS: Mediastinum and hilar structures are normal. Persistent mild basilar
atelectasis. No pneumothorax. Tiny right pleural effusion cannot be
excluded. Heart size stable. Pulmonary vascularity normal. No acute
osseous abnormality. Degenerative changes AC joints.
IMPRESSION: 1. Stable mild bibasilar atelectasis.
2. Tiny right pleural effusion cannot be excluded.

## 2016-12-03 NOTE — Telephone Encounter (Signed)
Zolpidem qaunity limited and step therapy. Pt states today she is going through [facility] to get this rx now

## 2016-12-08 NOTE — Telephone Encounter (Signed)
script shredded Medication faxed to [facility]

## 2016-12-26 NOTE — Progress Notes (Signed)
Subjective:    Natalie [name] is a 66 y.o. female who presents for Medicare Annual/Subsequent preventive examination.  Preventive Screening-Counseling & Management  Tobacco History  Smoking Status  . Never Smoker  Smokeless Tobacco  . Never Used     Problems Prior to Visit 1. Natalie [name] has had a complicated medical course this year resulting in several hospitalizations for urosepsis stemming from nephrolithiasis from July to October 2017. She has been released by urology and has done beautifully for the last 6 months without recurrence.    DMII: On glipizide 5 bid. A1c 2 mos prior 06/23/16 was 8.6. Urine Microalb elev 03/2015 and 07/05/2016. Optho appt  11/01/16 - Natalie [name] been taking cbgs qam 130-140 fasting. Sev hours after dinner at night can get to 130-140. No hypoglycemics. Rarely >160. No qhs snacks. Foot exam nml 08/2016  CKD stage 3: stable. EGFR 46 and unchanged for the past 5 months. No NSAIDs  HTN: On norvasc 5 qd, checks occ  Hypothyroid: On levothyroxine 75. TSH nml 4 mos prior and has been stable without dose change for over 4 years since prior to 03/2012.  Sz d/o: stable on phenobarbital. nml folate  HPL: On atorvastatin 20 qd. Lipids at goal 2 mos prior. LDL 101 with non-hdl 134  Migraine HA: Has only ever responded to stadol nasal spray and uses 1 bottle/wk.   Insomnia: an zolpidem 5-26m qhs prn  Resolved: IDA with h/o + HOC during hospitalization in Sept 2017. Had rbc transfusion on 9/24 and hemoglobin was the lowest at 6.9 with an iron infusion on 9/23 after which ferritin was 93 and was started on oral iron and a ppi. deffered GI for further eval. Unfortunately repeat test was Hemoccult negative 3 in October.  When last checked 4 months prior hemoglobin had normalized at 12.9. This is the first time patient's hemoglobin has ever been normal since 2015. Patient was advised that she could stop her iron supplement as long as she was getting  sufficient iron in her diet. Cont omeprazole  Vit B12 def: 142 in hosp. Given 2 inj of vit B12 during hospitalization then transitioned to an oral B12 supp at d/c. Anti-IF nml. Hard to tell if she felt any better after that. Patient is still on the on daily B12 supp which I informed her she could decrease her dose of if she desired as B12 level IV months prior was supratherapeutic at >2000.  Hypercalcemia: Noted at 10.6 with a normal high of 10.3 and patient's last lab draws 6 months and 4 months prior. Patient did have a similarly elevated calcium level in July when she was initially diagnosed with kidney stones. Other than that it has been low or more often than not hypocalcemic. Etiology unknown but will plan for recheck today and if persists will need evaluation as we'll want to ensure that this is not contributing to recurrent nephrolithiasis due to her recent difficult course.  Current Problems (verified) Patient Active Problem List   Diagnosis Date Noted  . Chronic kidney disease 09/06/2016  . Anemia, iron deficiency 06/23/2016  . Vitamin B12 deficiency (dietary) anemia 06/23/2016  . Renal stone 05/14/2016  . SIRS (systemic inflammatory response syndrome) ([place] 04/10/2016  . Type 2 diabetes mellitus ([facility] 01/04/2016  . Migraine, chronic, without aura, intractable, with status migrainosus 01/04/2016  . Abdominal pain   . 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 BMI 33.0-33.9,adult 03/30/2013  . Recurrent UTI 09/17/2012  . DM (diabetes  mellitus) ([place]) 03/19/2012  . HTN (hypertension) 03/19/2012  . Hyperlipemia 03/19/2012  . Hypothyroidism 03/19/2012  . Seizure disorder ([place]) 03/19/2012  . Insomnia 03/19/2012  . Migraine 03/19/2012    Medications Prior to Visit Current Outpatient Prescriptions on File Prior to Visit  Medication Sig Dispense Refill  . amLODipine (NORVASC) 5 MG tablet Take 1 tablet (5 mg total) by mouth daily. 90  tablet 3  . atorvastatin (LIPITOR) 20 MG tablet Take 1 tablet (20 mg total) by mouth daily. 90 tablet 3  . butorphanol (STADOL) 10 MG/ML nasal spray Place 1 spray into the nose every 4 (four) hours as needed for migraine. May refill weekly. 2.5 mL 5  . glipiZIDE (GLUCOTROL) 5 MG tablet TAKE 1 TABLET TWICE DAILY BEFORE A MEAL 180 tablet 1  . lactobacillus acidophilus (BACID) TABS tablet Take 2 tablets by mouth 3 (three) times daily.    Marland Kitchen levothyroxine (SYNTHROID, LEVOTHROID) 75 MCG tablet Take 1 tablet (75 mcg total) by mouth daily. 90 tablet 3  . omeprazole (PRILOSEC) 20 MG capsule Take 1 capsule (20 mg total) by mouth 2 (two) times daily before a meal. 180 capsule 1  . ondansetron (ZOFRAN-ODT) 8 MG disintegrating tablet DISSOLVE 1 TABLET ON OR UNDER THE TONGUE EVERY 8 HOURS AS NEEDED FOR NAUSEA 20 tablet 5  . PHENobarbital (LUMINAL) 97.2 MG tablet Take 1-2 tablets (97.2-194.4 mg total) by mouth daily. TAKE TWO TABLETS BY MOUTH ONCE DAILY ON  MON,  WED,  AND  FRI  AND  1  TABLET  DAILY  ON  OTHER  DAYS 135 tablet 1  . polycarbophil (FIBERCON) 625 MG tablet Take 625 mg by mouth daily.    Marland Kitchen terconazole (TERAZOL 3) 0.8 % vaginal cream Place 1 applicator vaginally at bedtime. 3 times a week. 80 g 3  . vitamin B-12 1000 MCG tablet Take 1 tablet (1,000 mcg total) by mouth daily. 30 tablet 0  . zolpidem (AMBIEN) 5 MG tablet take 1 tablet by mouth at bedtime for sleep if needed. May repeat x 1 in 1 hr if needed 60 tablet 5   Current Facility-Administered Medications on File Prior to Visit  Medication Dose Route Frequency Provider Last Rate Last Dose  . acetaminophen (TYLENOL) tablet 1,000 mg  1,000 mg Oral Once Wendie Agreste, MD        Current Medications (verified) Current Outpatient Prescriptions  Medication Sig Dispense Refill  . amLODipine (NORVASC) 5 MG tablet Take 1 tablet (5 mg total) by mouth daily. 90 tablet 3  . atorvastatin (LIPITOR) 20 MG tablet Take 1 tablet (20 mg total) by mouth  daily. 90 tablet 3  . butorphanol (STADOL) 10 MG/ML nasal spray Place 1 spray into the nose every 4 (four) hours as needed for migraine. May refill weekly. 2.5 mL 5  . glipiZIDE (GLUCOTROL) 5 MG tablet TAKE 1 TABLET TWICE DAILY BEFORE A MEAL 180 tablet 1  . lactobacillus acidophilus (BACID) TABS tablet Take 2 tablets by mouth 3 (three) times daily.    Marland Kitchen levothyroxine (SYNTHROID, LEVOTHROID) 75 MCG tablet Take 1 tablet (75 mcg total) by mouth daily. 90 tablet 3  . omeprazole (PRILOSEC) 20 MG capsule Take 1 capsule (20 mg total) by mouth 2 (two) times daily before a meal. 180 capsule 1  . ondansetron (ZOFRAN-ODT) 8 MG disintegrating tablet DISSOLVE 1 TABLET ON OR UNDER THE TONGUE EVERY 8 HOURS AS NEEDED FOR NAUSEA 20 tablet 5  . PHENobarbital (LUMINAL) 97.2 MG tablet Take 1-2 tablets (97.2-194.4 mg total)  by mouth daily. TAKE TWO TABLETS BY MOUTH ONCE DAILY ON  MON,  WED,  AND  FRI  AND  1  TABLET  DAILY  ON  OTHER  DAYS 135 tablet 1  . polycarbophil (FIBERCON) 625 MG tablet Take 625 mg by mouth daily.    Marland Kitchen terconazole (TERAZOL 3) 0.8 % vaginal cream Place 1 applicator vaginally at bedtime. 3 times a week. 80 g 3  . vitamin B-12 1000 MCG tablet Take 1 tablet (1,000 mcg total) by mouth daily. 30 tablet 0  . zolpidem (AMBIEN) 5 MG tablet take 1 tablet by mouth at bedtime for sleep if needed. May repeat x 1 in 1 hr if needed 60 tablet 5   Current Facility-Administered Medications  Medication Dose Route Frequency Provider Last Rate Last Dose  . acetaminophen (TYLENOL) tablet 1,000 mg  1,000 mg Oral Once Wendie Agreste, MD         Allergies (verified) Compazine; Cymbalta [duloxetine hcl]; Escitalopram oxalate; Methadone; Nitrofuran derivatives; and Macrobid [nitrofurantoin monohyd macro]   PAST HISTORY  Family History Family History  Problem Relation Age of Onset  . Heart disease Father   . Diabetes Father     Social History Social History  Substance Use Topics  . Smoking status: Never  Smoker  . Smokeless tobacco: Never Used  . Alcohol use No     Are there smokers in your home (other than you)? No  Risk Factors Current exercise habits: Not really but home exercise routine includes walking but tends not to do it in the winter so will try to increase.  Dietary issues discussed: tries to grill all foods, bakes more foods. Tries to avoid sugar, she does not drink sweet tea. He was told to drink crystal light by urologist which she is doing along with water and sprite zero.  Eats 2 meals a day and skips breakfast. She did have coffee with a little cream in the morning. (and did today but otherwise fasting).  Will often eat later around 7:30 but she often does not go to bed until around midnight.  Cardiac risk factors: advanced age (older than 38 for men, 46 for women), diabetes mellitus, dyslipidemia, hypertension, obesity (BMI >= 30 kg/m2) and sedentary lifestyle.  Depression Screen  Depression screen [facility] 2/9 12/27/2016 10/22/2016 09/06/2016 07/12/2016 07/05/2016  Decreased Interest 0 0 0 0 0  Down, Depressed, Hopeless 0 0 0 0 0  PHQ - 2 Score 0 0 0 0 0    Activities of Daily Living In your present state of health, do you have any difficulty performing the following activities?:  Driving? No Managing money?  No Feeding yourself? No Getting from bed to chair? No Climbing a flight of stairs? Yes - her mother fell down stairs and broke her hip and that stopped her from doing all her stuff so it was "the beginning of the end" Preparing food and eating?: No Bathing or showering? No Getting dressed: No Getting to the toilet? No Using the toilet:No Moving around from place to place: No In the past year have you fallen or had a near fall?:No   Are you sexually active?  Yes  Do you have more than one partner?  No  Hearing Difficulties: No Do you often ask people to speak up or repeat themselves? No Do you experience ringing or noises in your ears? No Do you have difficulty  understanding soft or whispered voices? No   Do you feel that you have a problem  with memory? Yes - a little worse than prior  Do you often misplace items? Yes - at baseline - finds xmas presents from years ago but   Do you feel safe at home?  Yes - although doesn't go down the basement stairs  Cognitive Testing  Alert? Yes  Normal Appearance?Yes  Oriented to person? Yes  Place? Yes   Time? Yes  Recall of three objects?  Yes  Can perform simple calculations? Yes  Displays appropriate judgment?Yes  Can read the correct time from a watch face?Yes   Advanced Directives have been discussed with the patient? Yes  List the Names of Other Physician/Practitioners you currently use: 1.  Gyn - not seen in > 2 yrs (marie jeanette . . . . , practice moved), no h /o abnml. Uses the teraconazol crema for dryness - was told it was the same as premarin - and occ C cream with itching. No vag bleeding or anml pap. 2.  UrologyDr. Luberta Robertson - Jan 26 - follow annually 3.  Optho - Feb 1 Johnn Hai any recent Medical Services you may have received from other than [facility] providers in the past year (date may be approximate).  Immunization History  Administered Date(s) Administered  . DTaP 07/25/2014  . Influenza Split 06/18/2013  . Influenza Whole 05/30/2012  . Influenza-Unspecified 07/25/2014, 06/06/2015, 05/31/2016  . Pneumococcal Polysaccharide-23 07/22/2012, 06/06/2015  . Pneumococcal-Unspecified 07/01/1998, 08/02/2007  . Tdap 12/23/2013  . Zoster 10/02/2011    Screening Tests Health Maintenance  Topic Date Due  . COLONOSCOPY  12/23/1998  . PNA vac Low Risk Adult (2 of 2 - PCV13) 06/05/2016  . MAMMOGRAM  11/01/2016  . HEMOGLOBIN A1C  12/21/2016  . URINE MICROALBUMIN  07/05/2017  . FOOT EXAM  09/06/2017  . OPHTHALMOLOGY EXAM  11/01/2017  . TETANUS/TDAP  07/25/2024  . INFLUENZA VACCINE  Completed  . DEXA SCAN  Completed  . Hepatitis C Screening  Completed    All answers were  reviewed with the patient and necessary referrals were made:  SHAW,EVA, MD   12/26/2016   History reviewed: allergies, current medications, past family history, past medical history, past social history, past surgical history and problem list  Review of Systems Pertinent items are noted in HPI.   c/o bilateral posterior gluteal/hamstring pain/hip pain, some back pain but not as bad.  Shot of nubain into her trochanteric bursa area but caused scarring to not be able to continues this?? Objective:    Visual Acuity Screening   Right eye Left eye Both eyes  Without correction:     With correction: 20/40-1 20/50 20/40    BP (!) 149/78 (BP Location: Right Arm, Patient Position: Sitting, Cuff Size: Small)   Pulse 75   Temp 98.1 F (36.7 C) (Oral)   Ht _0  (1.422 m)   Wt 161 lb 1.6 oz (73.1 kg)   SpO2 96%   BMI 36.12 kg/m   General Appearance:    Alert, cooperative, no distress, appears stated age  Head:    Normocephalic, without obvious abnormality, atraumatic  Eyes:    PERRL, conjunctiva/corneas clear, EOM's intact, fundi    benign, both eyes  Ears:    Normal TM's and external ear canals, both ears  Nose:   Nares normal, septum midline, mucosa normal, no drainage    or sinus tenderness  Throat:   Lips, mucosa, and tongue normal; teeth and gums normal  Neck:   Supple, symmetrical, trachea midline, no adenopathy;  thyroid:  no enlargement/tenderness/nodules; no carotid   bruit or JVD  Back:     Symmetric, no curvature, ROM normal, no CVA tenderness  Lungs:     Clear to auscultation bilaterally, respirations unlabored  Chest Wall:    No tenderness or deformity   Heart:    Regular rate and rhythm, S1 and S2 normal, no murmur, rub   or gallop  Breast Exam:    No tenderness, masses, or nipple abnormality  Abdomen:     Soft, non-tender, bowel sounds active all four quadrants,    no masses, no organomegaly        Extremities:   Extremities normal, atraumatic, no cyanosis or edema   Pulses:   2+ and symmetric all extremities  Skin:   Skin color, texture, turgor normal, no rashes or lesions  Lymph nodes:   Cervical, supraclavicular, and axillary nodes normal  Neurologic:   CNII-XII intact, normal strength, sensation and reflexes    throughout       Assessment:     1. Medicare annual wellness visit, subsequent   2. Screening for colorectal cancer   3. Pain in joint involving pelvic region and thigh, unspecified laterality   4. Essential hypertension   5. Type 2 diabetes mellitus with stage 3 chronic kidney disease, without long-term current use of insulin ([place])   6. Other specified hypothyroidism   7. Seizure disorder ([place])   8. Recurrent UTI   9. Stage 3 chronic kidney disease   10. Vitamin B12 deficiency (dietary) anemia   11. Hyperlipidemia, unspecified hyperlipidemia type   12. BMI 33.0-33.9,adult   13. Insomnia, unspecified type   14. Encounter for screening mammogram for breast cancer   15. Estrogen deficiency   16.    Hypercalcemia - this persists with normal vitamin D and normal PTH in December 2017. Due to the severity of the recent nephrolithiasis 8 months prior requiring 2 hospitalizations and multiple ER visits as well as a ureteral stent could think it is very important that we find the etiology and try to correct so that this does not hasten a recurrence of the kidney stones. Will have patient come back for additional lab testing including repeat PTH, ionized calcium, PTH related peptide, SPEP, and urine calcium. We will proceed with endocrinology referral to help workup etiology as well     Plan:  cmp, lipid, ua, crp, esr, a1c, b12 - no mma, checked 2x/yr   During the course of the visit the patient was educated and counseled about appropriate screening and preventive services including:    Orders Placed This Encounter  Procedures  . DG Si Joints    Standing Status:   Future    Number of Occurrences:   1    Standing Expiration Date:    12/27/2017    Order Specific Question:   Reason for Exam (SYMPTOM  OR DIAGNOSIS REQUIRED)    Answer:   bilateral SI joint pain and lateral hip pain    Order Specific Question:   Preferred imaging location?    Answer:   External  . DG Bone Density    PF; 04/06/2005 [place]  CR/PT  EPIC ORDER MEDICARE     Standing Status:   Future    Standing Expiration Date:   02/26/2018    Order Specific Question:   Reason for Exam (SYMPTOM  OR DIAGNOSIS REQUIRED)    Answer:   estrogen deficiency    Order Specific Question:   Preferred imaging location?  Answer:   [facility]  . Pneumococcal conjugate vaccine 13-valent IM  . Comprehensive metabolic panel    Order Specific Question:   Has the patient fasted?    Answer:   Yes  . Lipid panel    Order Specific Question:   Has the patient fasted?    Answer:   Yes  . Sedimentation Rate  . C-reactive protein  . Hemoglobin A1c  . Vitamin B12  . Methylmalonic Acid, Serum  . CBC  . CBC  . Ambulatory referral to Gastroenterology    Referral Priority:   Routine    Referral Type:   Consultation    Referral Reason:   Specialty Services Required    Number of Visits Requested:   1  . Care order/instruction:    Scheduling Instructions:     Complete orders, AVS and go.  Marland Kitchen POCT urinalysis dipstick    Meds ordered this encounter  Medications  . butorphanol (STADOL) 10 MG/ML nasal spray    Sig: Place 1 spray into the nose every 4 (four) hours as needed for migraine. May refill weekly.    Dispense:  2.5 mL    Refill:  5  . DISCONTD: zolpidem (AMBIEN) 5 MG tablet    Sig: take 1 tablet by mouth at bedtime for sleep if needed. May repeat x 1 in 1 hr if needed    Dispense:  60 tablet    Refill:  5  . PHENobarbital (LUMINAL) 97.2 MG tablet    Sig: Take 1-2 tablets (97.2-194.4 mg total) by mouth daily. TAKE TWO TABLETS BY MOUTH ONCE DAILY ON  MON,  WED,  AND  FRI  AND  1  TABLET  DAILY  ON  OTHER  DAYS    Dispense:  135 tablet    Refill:  1  . clobetasol  cream (TEMOVATE) 0.05 %    Sig: Apply 1 application topically 2 (two) times daily.  Marland Kitchen zolpidem (AMBIEN) 10 MG tablet    Sig: take 1 tablet by mouth at bedtime for sleep if needed.    Dispense:  30 tablet    Refill:  5    Delman Cheadle, M.D.  Primary Care at [facility] [place], [place] [address] [phone] phone [phone] fax  01/21/17 6:48 PM   Patient Instructions (the written plan) was given to the patient.  Medicare Attestation I have personally reviewed: The patient's medical and social history Their use of alcohol, tobacco or illicit drugs Their current medications and supplements The patient's functional ability including ADLs,fall risks, home safety risks, cognitive, and hearing and visual impairment Diet and physical activities Evidence for depression or mood disorders  The patient's weight, height, BMI, and visual acuity have been recorded in the chart.  I have made referrals, counseling, and provided education to the patient based on review of the above and I have provided the patient with a written personalized care plan for preventive services.      Delman Cheadle, MD   12/26/2016

## 2016-12-27 ENCOUNTER — Encounter: Payer: Self-pay | Admitting: Family Medicine

## 2016-12-27 ENCOUNTER — Ambulatory Visit (INDEPENDENT_AMBULATORY_CARE_PROVIDER_SITE_OTHER): Payer: Medicare Other

## 2016-12-27 ENCOUNTER — Telehealth: Payer: Self-pay

## 2016-12-27 ENCOUNTER — Ambulatory Visit (INDEPENDENT_AMBULATORY_CARE_PROVIDER_SITE_OTHER): Payer: Medicare Other | Admitting: Family Medicine

## 2016-12-27 VITALS — BP 149/78 | HR 75 | Temp 98.1°F | Ht <= 58 in | Wt 161.1 lb

## 2016-12-27 DIAGNOSIS — E785 Hyperlipidemia, unspecified: Secondary | ICD-10-CM

## 2016-12-27 DIAGNOSIS — N183 Chronic kidney disease, stage 3 unspecified: Secondary | ICD-10-CM

## 2016-12-27 DIAGNOSIS — Z0000 Encounter for general adult medical examination without abnormal findings: Secondary | ICD-10-CM

## 2016-12-27 DIAGNOSIS — Z1211 Encounter for screening for malignant neoplasm of colon: Secondary | ICD-10-CM

## 2016-12-27 DIAGNOSIS — Z6833 Body mass index (BMI) 33.0-33.9, adult: Secondary | ICD-10-CM

## 2016-12-27 DIAGNOSIS — I10 Essential (primary) hypertension: Secondary | ICD-10-CM

## 2016-12-27 DIAGNOSIS — M25559 Pain in unspecified hip: Secondary | ICD-10-CM

## 2016-12-27 DIAGNOSIS — Z23 Encounter for immunization: Secondary | ICD-10-CM | POA: Diagnosis not present

## 2016-12-27 DIAGNOSIS — G40909 Epilepsy, unspecified, not intractable, without status epilepticus: Secondary | ICD-10-CM

## 2016-12-27 DIAGNOSIS — E1122 Type 2 diabetes mellitus with diabetic chronic kidney disease: Secondary | ICD-10-CM

## 2016-12-27 DIAGNOSIS — Z1231 Encounter for screening mammogram for malignant neoplasm of breast: Secondary | ICD-10-CM | POA: Diagnosis not present

## 2016-12-27 DIAGNOSIS — D518 Other vitamin B12 deficiency anemias: Secondary | ICD-10-CM | POA: Diagnosis not present

## 2016-12-27 DIAGNOSIS — E038 Other specified hypothyroidism: Secondary | ICD-10-CM

## 2016-12-27 DIAGNOSIS — M533 Sacrococcygeal disorders, not elsewhere classified: Secondary | ICD-10-CM | POA: Diagnosis not present

## 2016-12-27 DIAGNOSIS — E2839 Other primary ovarian failure: Secondary | ICD-10-CM

## 2016-12-27 DIAGNOSIS — N390 Urinary tract infection, site not specified: Secondary | ICD-10-CM

## 2016-12-27 DIAGNOSIS — Z1212 Encounter for screening for malignant neoplasm of rectum: Secondary | ICD-10-CM | POA: Diagnosis not present

## 2016-12-27 DIAGNOSIS — N200 Calculus of kidney: Secondary | ICD-10-CM | POA: Diagnosis not present

## 2016-12-27 DIAGNOSIS — G4700 Insomnia, unspecified: Secondary | ICD-10-CM

## 2016-12-27 LAB — POCT URINALYSIS DIP (MANUAL ENTRY)
BILIRUBIN UA: NEGATIVE
Glucose, UA: NEGATIVE
Ketones, POC UA: NEGATIVE
Nitrite, UA: NEGATIVE
PH UA: 5.5 (ref 5.0–8.0)
RBC UA: NEGATIVE
Spec Grav, UA: 1.015 (ref 1.030–1.035)
Urobilinogen, UA: 0.2 (ref ?–2.0)

## 2016-12-27 MED ORDER — BUTORPHANOL TARTRATE 10 MG/ML NA SOLN: 1.0000 | NASAL | 5 refills | Status: DC | PRN

## 2016-12-27 MED ORDER — ZOLPIDEM TARTRATE 10 MG PO TABS: ORAL_TABLET | ORAL | 5 refills | Status: DC

## 2016-12-27 MED ORDER — PHENOBARBITAL 97.2 MG PO TABS: 97.2000 mg | ORAL_TABLET | Freq: Every day | ORAL | 1 refills | Status: DC

## 2016-12-27 MED ORDER — ZOLPIDEM TARTRATE 5 MG PO TABS: ORAL_TABLET | ORAL | 5 refills | Status: DC

## 2016-12-27 NOTE — Telephone Encounter (Signed)
Changed from 5mg  #60 tabs to 10mg  #30 tabs and faxed over to her pharmacy

## 2016-12-27 NOTE — Patient Instructions (Addendum)
We recommend that you schedule a mammogram for breast cancer screening. Typically, you do not need a referral to do this. Please contact a local imaging center to schedule your mammogram.  [facility] ([place]) - [phone] or [phone]     IF you received an x-ray today, you will receive an invoice from [facility]. Please contact [facility] at [phone] with questions or concerns regarding your invoice.   IF you received labwork today, you will receive an invoice from [facility]. Please contact [facility] at [phone] with questions or concerns regarding your invoice.   Our billing staff will not be able to assist you with questions regarding bills from these companies.  You will be contacted with the lab results as soon as they are available. The fastest way to get your results is to activate your My Chart account. Instructions are located on the last page of this paperwork. If you have not heard from [place] regarding the results in 2 weeks, please contact this office.      Bone Health Bones protect organs, store calcium, and anchor muscles. Good health habits, such as eating nutritious foods and exercising regularly, are important for maintaining healthy bones. They can also help to prevent a condition that causes bones to lose density and become weak and brittle (osteoporosis). Why is bone mass important? Bone mass refers to the amount of bone tissue that you have. The higher your bone mass, the stronger your bones. An important step toward having healthy bones throughout life is to have strong and dense bones during childhood. A young adult who has a high bone mass is more likely to have a high bone mass later in life. Bone mass at its greatest it is called peak bone mass. A large decline in bone mass occurs in older adults. In women, it occurs about the time of menopause. During this time, it is important to practice good health habits, because  if more bone is lost than what is replaced, the bones will become less healthy and more likely to break (fracture). If you find that you have a low bone mass, you may be able to prevent osteoporosis or further bone loss by changing your diet and lifestyle. How can I find out if my bone mass is low? Bone mass can be measured with an X-ray test that is called a bone mineral density (BMD) test. This test is recommended for all women who are age 62 or older. It may also be recommended for men who are age 90 or older, or for people who are more likely to develop osteoporosis due to:  Having bones that break easily.  Having a long-term disease that weakens bones, such as kidney disease or rheumatoid arthritis.  Having menopause earlier than normal.  Taking medicine that weakens bones, such as steroids, thyroid hormones, or hormone treatment for breast cancer or prostate cancer.  Smoking.  Drinking three or more alcoholic drinks each day. What are the nutritional recommendations for healthy bones? To have healthy bones, you need to get enough of the right minerals and vitamins. Most nutrition experts recommend getting these nutrients from the foods that you eat. Nutritional recommendations vary from person to person. Ask your health care provider what is healthy for you. Here are some general guidelines. Calcium Recommendations  Calcium is the most important (essential) mineral for bone health. Most people can get enough calcium from their diet, but supplements may be recommended for people who are at risk for  osteoporosis. Good sources of calcium include:  Dairy products, such as low-fat or nonfat milk, cheese, and yogurt.  Dark green leafy vegetables, such as bok choy and broccoli.  Calcium-fortified foods, such as orange juice, cereal, bread, soy beverages, and tofu products.  Nuts, such as almonds. Follow these recommended amounts for daily calcium intake:  Children, age 320?3: 700  mg.  Children, age 32?8: 1,000 mg.  Children, age 327?13: 1,300 mg.  Teens, age 58?18: 1,300 mg.  Adults, age 29?50: 1,000 mg.  Adults, age 37?70:  Men: 1,000 mg.  Women: 1,200 mg.  Adults, age 32 or older: 1,200 mg.  Pregnant and breastfeeding females:  Teens: 1,300 mg.  Adults: 1,000 mg. Vitamin D Recommendations  Vitamin D is the most essential vitamin for bone health. It helps the body to absorb calcium. Sunlight stimulates the skin to make vitamin D, so be sure to get enough sunlight. If you live in a cold climate or you do not get outside often, your health care provider may recommend that you take vitamin D supplements. Good sources of vitamin D in your diet include:  Egg yolks.  Saltwater fish.  Milk and cereal fortified with vitamin D. Follow these recommended amounts for daily vitamin D intake:  Children and teens, age 75?18: 31 international units.  Adults, age 67 or younger: 400-800 international units.  Adults, age 27 or older: 800-1,000 international units. Other Nutrients  Other nutrients for bone health include:  Phosphorus. This mineral is found in meat, poultry, dairy foods, nuts, and legumes. The recommended daily intake for adult men and adult women is 700 mg.  Magnesium. This mineral is found in seeds, nuts, dark green vegetables, and legumes. The recommended daily intake for adult men is 400?420 mg. For adult women, it is 310?320 mg.  Vitamin K. This vitamin is found in green leafy vegetables. The recommended daily intake is 120 mg for adult men and 90 mg for adult women. What type of physical activity is best for building and maintaining healthy bones? Weight-bearing and strength-building activities are important for building and maintaining peak bone mass. Weight-bearing activities cause muscles and bones to work against gravity. Strength-building activities increases muscle strength that supports bones. Weight-bearing and muscle-building activities  include:  Walking and hiking.  Jogging and running.  Dancing.  Gym exercises.  Lifting weights.  Tennis and racquetball.  Climbing stairs.  Aerobics. Adults should get at least 30 minutes of moderate physical activity on most days. Children should get at least 60 minutes of moderate physical activity on most days. Ask your health care provide what type of exercise is best for you. Where can I find more information? For more information, check out the following websites:  [place]: [employer].se  [employer] of Health: http://www.niams.AnonymousEar.fr.asp This information is not intended to replace advice given to you by your health care provider. Make sure you discuss any questions you have with your health care provider. Document Released: 12/08/2003 Document Revised: 04/06/2016 Document Reviewed: 09/22/2014 Elsevier Interactive Patient Education  2017 [employer].  Exercising to [employer] Exercising can help you to lose weight. In order to lose weight through exercise, you need to do vigorous-intensity exercise. You can tell that you are exercising with vigorous intensity if you are breathing very hard and fast and cannot hold a conversation while exercising. Moderate-intensity exercise helps to maintain your current weight. You can tell that you are exercising at a moderate level if you have a higher heart rate and faster  breathing, but you are still able to hold a conversation. How often should I exercise? Choose an activity that you enjoy and set realistic goals. Your health care provider can help you to make an activity plan that works for you. Exercise regularly as directed by your health care provider. This may include:  Doing resistance training twice each week, such as:  Push-ups.  Sit-ups.  Lifting weights.  Using resistance bands.  Doing a given intensity of exercise for  a given amount of time. Choose from these options:  150 minutes of moderate-intensity exercise every week.  75 minutes of vigorous-intensity exercise every week.  A mix of moderate-intensity and vigorous-intensity exercise every week. Children, pregnant women, people who are out of shape, people who are overweight, and older adults may need to consult a health care provider for individual recommendations. If you have any sort of medical condition, be sure to consult your health care provider before starting a new exercise program. What are some activities that can help me to lose weight?  Walking at a rate of at least 4.5 miles an hour.  Jogging or running at a rate of 5 miles per hour.  Biking at a rate of at least 10 miles per hour.  Lap swimming.  Roller-skating or in-line skating.  Cross-country skiing.  Vigorous competitive sports, such as football, basketball, and soccer.  Jumping rope.  Aerobic dancing. How can I be more active in my day-to-day activities?  Use the stairs instead of the elevator.  Take a walk during your lunch break.  If you drive, park your car farther away from work or school.  If you take public transportation, get off one stop early and walk the rest of the way.  Make all of your phone calls while standing up and walking around.  Get up, stretch, and walk around every 30 minutes throughout the day. What guidelines should I follow while exercising?  Do not exercise so much that you hurt yourself, feel dizzy, or get very short of breath.  Consult your health care provider prior to starting a new exercise program.  Wear comfortable clothes and shoes with good support.  Drink plenty of water while you exercise to prevent dehydration or heat stroke. Body water is lost during exercise and must be replaced.  Work out until you breathe faster and your heart beats faster. This information is not intended to replace advice given to you by your  health care provider. Make sure you discuss any questions you have with your health care provider. Document Released: 10/20/2010 Document Revised: 02/23/2016 Document Reviewed: 02/18/2014 Elsevier Interactive Patient Education  2017 [employer] Inc. Diabetes Mellitus and Food It is important for you to manage your blood sugar (glucose) level. Your blood glucose level can be greatly affected by what you eat. Eating healthier foods in the appropriate amounts throughout the day at about the same time each day will help you control your blood glucose level. It can also help slow or prevent worsening of your diabetes mellitus. Healthy eating may even help you improve the level of your blood pressure and reach or maintain a healthy weight. General recommendations for healthful eating and cooking habits include:  Eating meals and snacks regularly. Avoid going long periods of time without eating to lose weight.  Eating a diet that consists mainly of plant-based foods, such as fruits, vegetables, nuts, legumes, and whole grains.  Using low-heat cooking methods, such as baking, instead of high-heat cooking methods, such as deep frying.  Work with your dietitian to make sure you understand how to use the Nutrition Facts information on food labels. How can food affect me? Carbohydrates  Carbohydrates affect your blood glucose level more than any other type of food. Your dietitian will help you determine how many carbohydrates to eat at each meal and teach you how to count carbohydrates. Counting carbohydrates is important to keep your blood glucose at a healthy level, especially if you are using insulin or taking certain medicines for diabetes mellitus. Alcohol  Alcohol can cause sudden decreases in blood glucose (hypoglycemia), especially if you use insulin or take certain medicines for diabetes mellitus. Hypoglycemia can be a life-threatening condition. Symptoms of hypoglycemia (sleepiness, dizziness, and  disorientation) are similar to symptoms of having too much alcohol. If your health care provider has given you approval to drink alcohol, do so in moderation and use the following guidelines:  Women should not have more than one drink per day, and men should not have more than two drinks per day. One drink is equal to:  12 oz of beer.  5 oz of wine.  1 oz of hard liquor.  Do not drink on an empty stomach.  Keep yourself hydrated. Have water, diet soda, or unsweetened iced tea.  Regular soda, juice, and other mixers might contain a lot of carbohydrates and should be counted. What foods are not recommended? As you make food choices, it is important to remember that all foods are not the same. Some foods have fewer nutrients per serving than other foods, even though they might have the same number of calories or carbohydrates. It is difficult to get your body what it needs when you eat foods with fewer nutrients. Examples of foods that you should avoid that are high in calories and carbohydrates but low in nutrients include:  Trans fats (most processed foods list trans fats on the Nutrition Facts label).  Regular soda.  Juice.  Candy.  Sweets, such as cake, pie, doughnuts, and cookies.  Fried foods. What foods can I eat? Eat nutrient-rich foods, which will nourish your body and keep you healthy. The food you should eat also will depend on several factors, including:  The calories you need.  The medicines you take.  Your weight.  Your blood glucose level.  Your blood pressure level.  Your cholesterol level. You should eat a variety of foods, including:  Protein.  Lean cuts of meat.  Proteins low in saturated fats, such as fish, egg whites, and beans. Avoid processed meats.  Fruits and vegetables.  Fruits and vegetables that may help control blood glucose levels, such as apples, mangoes, and yams.  Dairy products.  Choose fat-free or low-fat dairy products, such  as milk, yogurt, and cheese.  Grains, bread, pasta, and rice.  Choose whole grain products, such as multigrain bread, whole oats, and brown rice. These foods may help control blood pressure.  Fats.  Foods containing healthful fats, such as nuts, avocado, olive oil, canola oil, and fish. Does everyone with diabetes mellitus have the same meal plan? Because every person with diabetes mellitus is different, there is not one meal plan that works for everyone. It is very important that you meet with a dietitian who will help you create a meal plan that is just right for you. This information is not intended to replace advice given to you by your health care provider. Make sure you discuss any questions you have with your health care provider. Document Released: 06/14/2005 Document  Revised: 02/23/2016 Document Reviewed: 08/14/2013 Elsevier Interactive Patient Education  2017 [employer].

## 2016-12-27 NOTE — Telephone Encounter (Signed)
Telephone call from patient stating that ins co is AGAIN denying PA for zolpidem due to quantity.  She asked me to send a message to Dr. Brigitte Pulse to see if she can reduce the quantity to 30 tabs. Patient Natalie [name], [place].  [phone].  Please advise.Marland KitchenMarland Kitchen

## 2016-12-30 LAB — HEMOGLOBIN A1C
Est. average glucose Bld gHb Est-mCnc: 151 mg/dL
Hgb A1c MFr Bld: 6.9 % — ABNORMAL HIGH (ref 4.8–5.6)

## 2016-12-30 LAB — COMPREHENSIVE METABOLIC PANEL
ALT: 15 IU/L (ref 0–32)
AST: 18 IU/L (ref 0–40)
Albumin/Globulin Ratio: 1.7 (ref 1.2–2.2)
Albumin: 4.9 g/dL — ABNORMAL HIGH (ref 3.6–4.8)
Alkaline Phosphatase: 85 IU/L (ref 39–117)
BUN/Creatinine Ratio: 25 (ref 12–28)
BUN: 26 mg/dL (ref 8–27)
Bilirubin Total: <0.2 mg/dL (ref 0.0–1.2)
CALCIUM: 10.7 mg/dL — AB (ref 8.7–10.3)
CO2: 22 mmol/L (ref 18–29)
Chloride: 99 mmol/L (ref 96–106)
Creatinine, Ser: 1.05 mg/dL — ABNORMAL HIGH (ref 0.57–1.00)
GFR, EST AFRICAN AMERICAN: 63 mL/min/{1.73_m2} (ref >59)
GFR, EST NON AFRICAN AMERICAN: 55 mL/min/{1.73_m2} — AB (ref >59)
GLUCOSE: 121 mg/dL — AB (ref 65–99)
Globulin, Total: 2.9 g/dL (ref 1.5–4.5)
Potassium: 4.5 mmol/L (ref 3.5–5.2)
Sodium: 140 mmol/L (ref 134–144)
Total Protein: 7.8 g/dL (ref 6.0–8.5)

## 2016-12-30 LAB — LIPID PANEL
CHOLESTEROL TOTAL: 172 mg/dL (ref 100–199)
Chol/HDL Ratio: 2.4 ratio units (ref 0.0–4.4)
HDL: 71 mg/dL (ref >39)
LDL Calculated: 79 mg/dL (ref 0–99)
TRIGLYCERIDES: 111 mg/dL (ref 0–149)
VLDL CHOLESTEROL CAL: 22 mg/dL (ref 5–40)

## 2016-12-30 LAB — CBC
HEMATOCRIT: 40.7 % (ref 34.0–46.6)
Hemoglobin: 13.4 g/dL (ref 11.1–15.9)
MCH: 30.7 pg (ref 26.6–33.0)
MCHC: 32.9 g/dL (ref 31.5–35.7)
MCV: 93 fL (ref 79–97)
PLATELETS: 325 10*3/uL (ref 150–379)
RBC: 4.36 x10E6/uL (ref 3.77–5.28)
RDW: 14.6 % (ref 12.3–15.4)
WBC: 7 10*3/uL (ref 3.4–10.8)

## 2016-12-30 LAB — C-REACTIVE PROTEIN: CRP: 2.3 mg/L (ref 0.0–4.9)

## 2016-12-30 LAB — VITAMIN B12

## 2016-12-30 LAB — SEDIMENTATION RATE: Sed Rate: 4 mm/hr (ref 0–40)

## 2016-12-30 LAB — METHYLMALONIC ACID, SERUM: METHYLMALONIC ACID: 161 nmol/L (ref 0–378)

## 2017-01-07 ENCOUNTER — Ambulatory Visit: Payer: Medicare Other | Admitting: Family Medicine

## 2017-01-08 ENCOUNTER — Other Ambulatory Visit: Payer: Self-pay | Admitting: Family Medicine

## 2017-01-08 DIAGNOSIS — Z1231 Encounter for screening mammogram for malignant neoplasm of breast: Secondary | ICD-10-CM

## 2017-01-21 ENCOUNTER — Telehealth: Payer: Self-pay | Admitting: Family Medicine

## 2017-01-21 MED ORDER — CLOBETASOL PROPIONATE 0.05 % EX CREA: 1.0000 "application " | TOPICAL_CREAM | Freq: Every day | CUTANEOUS | 1 refills | Status: DC | PRN

## 2017-01-21 NOTE — Telephone Encounter (Signed)
Pt was seen on March 29 and has never received her lab results.  Pt also spoke to nurse after her visit about getting a refill on her clobetasol propionate and she has not heard anything about it.  Her original doctor who prescribed it has moved and she needs this bad.  Please call [phone]

## 2017-01-21 NOTE — Telephone Encounter (Addendum)
So sorry. I must not have gotten that noted on the clobetasol. Refill sent to her pharmacy.  Her labs all look great other than her calcium is still a smidge high. Normally I wouldn't worry about this except I want to make sure this doesn't cause more kidney stones. I have put in additional labs to continue trying to find a cause of the high calcium. If she could come by and have them drawn at a lab only visit that would be great. I will also go ahead with an endocrinology referral to evaluate this as sometimes it can take several months to get an appointment. Depending on the results of the additional lab work we may be able to cancel this but for the meantime she should go ahead and schedule. Make sure she is not taking any calcium supplements or eating/drinking foods that are fortified with extra calcium.  Everything else is better including an awesome hemoglobin A1c 6.9 - best in > a year - and kidneys are much better - increased from 46 to 55% function!  Anemia is still GONE (first time since 2015!!)

## 2017-01-23 ENCOUNTER — Telehealth: Payer: Self-pay

## 2017-01-23 NOTE — Telephone Encounter (Signed)
Pt advised.

## 2017-01-28 ENCOUNTER — Ambulatory Visit
Admission: RE | Admit: 2017-01-28 | Discharge: 2017-01-28 | Disposition: A | Discharge status: Home / Self Care | Payer: Medicare Other | Source: Ambulatory Visit | Attending: Family Medicine | Admitting: Family Medicine

## 2017-01-28 ENCOUNTER — Ambulatory Visit: Payer: Medicare Other

## 2017-01-28 DIAGNOSIS — Z1231 Encounter for screening mammogram for malignant neoplasm of breast: Secondary | ICD-10-CM

## 2017-01-28 DIAGNOSIS — E2839 Other primary ovarian failure: Secondary | ICD-10-CM

## 2017-01-28 DIAGNOSIS — Z780 Asymptomatic menopausal state: Secondary | ICD-10-CM | POA: Diagnosis not present

## 2017-01-28 DIAGNOSIS — M85851 Other specified disorders of bone density and structure, right thigh: Secondary | ICD-10-CM | POA: Diagnosis not present

## 2017-01-28 DIAGNOSIS — N183 Chronic kidney disease, stage 3 (moderate): Secondary | ICD-10-CM | POA: Diagnosis not present

## 2017-01-28 DIAGNOSIS — Z0000 Encounter for general adult medical examination without abnormal findings: Secondary | ICD-10-CM

## 2017-01-28 DIAGNOSIS — N200 Calculus of kidney: Secondary | ICD-10-CM | POA: Diagnosis not present

## 2017-01-30 ENCOUNTER — Encounter: Payer: Self-pay | Admitting: Family Medicine

## 2017-02-01 ENCOUNTER — Encounter: Payer: Self-pay | Admitting: Family Medicine

## 2017-02-02 NOTE — Telephone Encounter (Signed)
Pt called returning a phone call.  Please advise: [phone]

## 2017-02-03 LAB — CALCIUM, IONIZED: Calcium, Ion: 5.6 mg/dL (ref 4.5–5.6)

## 2017-02-03 LAB — MULTIPLE MYELOMA PANEL, SERUM
ALBUMIN/GLOB SERPL: 1.3 (ref 0.7–1.7)
Albumin SerPl Elph-Mcnc: 4 g/dL (ref 2.9–4.4)
Alpha 1: 0.2 g/dL (ref 0.0–0.4)
Alpha2 Glob SerPl Elph-Mcnc: 0.8 g/dL (ref 0.4–1.0)
B-GLOBULIN SERPL ELPH-MCNC: 1.1 g/dL (ref 0.7–1.3)
GAMMA GLOB SERPL ELPH-MCNC: 1.1 g/dL (ref 0.4–1.8)
GLOBULIN, TOTAL: 3.3 g/dL (ref 2.2–3.9)
IGA/IMMUNOGLOBULIN A, SERUM: 89 mg/dL (ref 87–352)
IGM (IMMUNOGLOBULIN M), SRM: 50 mg/dL (ref 26–217)
IgG (Immunoglobin G), Serum: 1095 mg/dL (ref 700–1600)
Total Protein: 7.3 g/dL (ref 6.0–8.5)

## 2017-02-03 LAB — CALCIUM / CREATININE RATIO, URINE
Calcium, Urine: 1.7 mg/dL
Calcium/Creat.Ratio: 106 mg/g creat (ref 0–260)
Creatinine, Urine: 16.1 mg/dL

## 2017-02-03 LAB — PTH, INTACT AND CALCIUM
CALCIUM: 10.4 mg/dL — AB (ref 8.7–10.3)
PTH: 43 pg/mL (ref 15–65)

## 2017-02-03 LAB — PTH-RELATED PEPTIDE: PTH-related peptide: <2 pmol/L

## 2017-02-05 ENCOUNTER — Telehealth: Payer: Self-pay

## 2017-02-05 DIAGNOSIS — G4700 Insomnia, unspecified: Secondary | ICD-10-CM

## 2017-02-06 NOTE — Telephone Encounter (Signed)
Worked on PA for Zolpidem Tartrate 10 mg tablet for patient, yesterday.  I was told her request had been denied because it was now a step therapy program and she needed to try Rozerem for 3 months before they would approve the Zolpidem tartrate.  I did not call the patient yet, awaiting your response.

## 2017-02-08 MED ORDER — ZOLPIDEM TARTRATE 10 MG PO TABS: ORAL_TABLET | ORAL | 1 refills | Status: DC

## 2017-02-08 MED ORDER — BUTORPHANOL TARTRATE 10 MG/ML NA SOLN: 1.0000 | NASAL | 5 refills | Status: DC | PRN

## 2017-02-08 NOTE — Telephone Encounter (Signed)
Both stadol and ambien rx up front

## 2017-02-08 NOTE — Telephone Encounter (Signed)
There is no note that we called her

## 2017-02-08 NOTE — Telephone Encounter (Signed)
Talked with the patient. She prefers to use a good Rx coupon and by the zolpidem out of pocket without her insurance through [facility] [address].  This will end up actually being cheaper than her co-pay was when it was covered. Will have refill called in.  Patient requests a refill on her Stadol. Printed for patient to pick up.  Patient had appointment scheduled with me on Monday 5/14 to address this. Okay to cancel appointment and reschedule for follow-up of HTN, migraines and other chronic medical conditions in late July or early August.

## 2017-02-11 ENCOUNTER — Ambulatory Visit: Payer: Medicare Other | Admitting: Family Medicine

## 2017-02-19 ENCOUNTER — Encounter: Payer: Self-pay | Admitting: Endocrinology

## 2017-03-21 ENCOUNTER — Telehealth: Payer: Self-pay | Admitting: Family Medicine

## 2017-03-21 MED ORDER — BUTORPHANOL TARTRATE 10 MG/ML NA SOLN: 1.0000 | NASAL | 5 refills | Status: DC | PRN

## 2017-03-21 NOTE — Telephone Encounter (Signed)
rx ready for pickup 

## 2017-03-21 NOTE — Telephone Encounter (Signed)
At 102 ready for pick up.

## 2017-03-21 NOTE — Telephone Encounter (Signed)
Pt is calling to request a refill of her Stadol medication.  She states she usually comes in and picks it up.  Last O/V was 12/27/16 with Brigitte Pulse.  Please advise when ready or if O/V is needed [phone]

## 2017-03-21 NOTE — Telephone Encounter (Signed)
Please advise 

## 2017-04-02 ENCOUNTER — Other Ambulatory Visit: Payer: Self-pay | Admitting: Family Medicine

## 2017-04-12 ENCOUNTER — Ambulatory Visit (INDEPENDENT_AMBULATORY_CARE_PROVIDER_SITE_OTHER): Payer: Medicare Other | Admitting: Family Medicine

## 2017-04-12 VITALS — BP 151/77 | HR 90 | Temp 98.4°F | Resp 16 | Ht <= 58 in | Wt 166.2 lb

## 2017-04-12 DIAGNOSIS — D518 Other vitamin B12 deficiency anemias: Secondary | ICD-10-CM

## 2017-04-12 DIAGNOSIS — G40909 Epilepsy, unspecified, not intractable, without status epilepticus: Secondary | ICD-10-CM

## 2017-04-12 DIAGNOSIS — E785 Hyperlipidemia, unspecified: Secondary | ICD-10-CM | POA: Diagnosis not present

## 2017-04-12 DIAGNOSIS — G4700 Insomnia, unspecified: Secondary | ICD-10-CM | POA: Diagnosis not present

## 2017-04-12 DIAGNOSIS — N183 Chronic kidney disease, stage 3 unspecified: Secondary | ICD-10-CM

## 2017-04-12 DIAGNOSIS — G43719 Chronic migraine without aura, intractable, without status migrainosus: Secondary | ICD-10-CM | POA: Diagnosis not present

## 2017-04-12 DIAGNOSIS — E1129 Type 2 diabetes mellitus with other diabetic kidney complication: Secondary | ICD-10-CM | POA: Diagnosis not present

## 2017-04-12 DIAGNOSIS — E038 Other specified hypothyroidism: Secondary | ICD-10-CM

## 2017-04-12 DIAGNOSIS — D509 Iron deficiency anemia, unspecified: Secondary | ICD-10-CM | POA: Diagnosis not present

## 2017-04-12 DIAGNOSIS — I10 Essential (primary) hypertension: Secondary | ICD-10-CM | POA: Diagnosis not present

## 2017-04-12 LAB — POCT GLYCOSYLATED HEMOGLOBIN (HGB A1C): HEMOGLOBIN A1C: 7.5

## 2017-04-12 MED ORDER — LOSARTAN POTASSIUM 50 MG PO TABS
50.0000 mg | ORAL_TABLET | Freq: Every day | ORAL | 3 refills | Status: DC
Start: 2017-04-12 — End: 2018-02-08

## 2017-04-12 MED ORDER — BUTORPHANOL TARTRATE 10 MG/ML NA SOLN: 1.0000 | NASAL | 5 refills | Status: DC | PRN

## 2017-04-12 MED ORDER — ZOLPIDEM TARTRATE 10 MG PO TABS: ORAL_TABLET | ORAL | 1 refills | Status: DC

## 2017-04-12 NOTE — Progress Notes (Addendum)
Subjective:  By signing my name below, I, Moises Blood, attest that this documentation has been prepared under the direction and in the presence of Delman Cheadle, MD. Electronically Signed: Moises Blood, [place]. 04/12/2017 , 4:43 PM .  Patient was seen in Room 12 .   Patient ID: Natalie [name], female    DOB: 1951/07/11, 66 y.o.   MRN: [id number] Chief Complaint  Patient presents with  . Medication Refill    States shes here for a check up, unsure which meds she needs refilled    HPI Natalie [name] is a 66 y.o. female who presents to Primary Care at [facility] for follow up and medications refill.   Her A1C was 6.9 in March, but today, it was 7.5. She denies symptomatic lows.   Her home BP readings usually run in the 140s/70s. She was taken off BP medications due to kidney problems, but will restart. She has occasional migraines when things get piled up. She's been off of her omeprazole; denies heartburn. She denies any abdominal issues.   She was taking zolpidem for insomnia, but pharmacy switched brands. She states that it doesn't work as well.   She is still taking her B-12 supplement.   Family Her son was recently in a motorcycle accident, and suffered punctured lung.  Her granddaughter graduated high school and will work in [employer] for the summer; will attend college this year.   Past Medical History:  Diagnosis Date  . Chronic kidney disease    kidney stones, right and left  . Diabetes mellitus without complication ([place])   . Headache    migraines  . Hyperlipidemia   . Hypertension   . Iron deficiency anemia   . Renal stone   . Seizures ([place])    none x 30 yrs   Prior to Admission medications   Medication Sig Start Date End Date Taking? Authorizing Provider  amLODipine (NORVASC) 5 MG tablet Take 1 tablet (5 mg total) by mouth daily. 10/22/16  Yes Shawnee Knapp, MD  atorvastatin (LIPITOR) 20 MG tablet Take 1 tablet (20 mg total) by mouth daily. 10/22/16  Yes Shawnee Knapp, MD  butorphanol  (STADOL) 10 MG/ML nasal spray Place 1 spray into the nose every 4 (four) hours as needed for migraine. May refill weekly. 03/21/17  Yes Shawnee Knapp, MD  clobetasol cream (TEMOVATE) 3.66 % Apply 1 application topically daily as needed. 01/21/17  Yes Shawnee Knapp, MD  glipiZIDE (GLUCOTROL) 5 MG tablet TAKE 1 TABLET TWICE DAILY BEFORE A MEAL 04/04/17  Yes Shawnee Knapp, MD  lactobacillus acidophilus (BACID) TABS tablet Take 2 tablets by mouth 3 (three) times daily.   Yes [provider]  levothyroxine (SYNTHROID, LEVOTHROID) 75 MCG tablet Take 1 tablet (75 mcg total) by mouth daily. 10/22/16  Yes Shawnee Knapp, MD  omeprazole (PRILOSEC) 20 MG capsule Take 1 capsule (20 mg total) by mouth 2 (two) times daily before a meal. 07/05/16  Yes Shawnee Knapp, MD  ondansetron (ZOFRAN-ODT) 8 MG disintegrating tablet DISSOLVE 1 TABLET ON OR UNDER THE TONGUE EVERY 8 HOURS AS NEEDED FOR NAUSEA 11/19/16  Yes Shawnee Knapp, MD  PHENobarbital (LUMINAL) 97.2 MG tablet Take 1-2 tablets (97.2-194.4 mg total) by mouth daily. TAKE TWO TABLETS BY MOUTH ONCE DAILY ON  MON,  WED,  AND  FRI  AND  1  TABLET  DAILY  ON  OTHER  DAYS 12/27/16  Yes Shawnee Knapp, MD  polycarbophil (FIBERCON) 625 MG tablet  Take 625 mg by mouth daily.   Yes [provider]  terconazole (TERAZOL 3) 0.8 % vaginal cream Place 1 applicator vaginally at bedtime. 3 times a week. 10/22/16  Yes Shawnee Knapp, MD  vitamin B-12 1000 MCG tablet Take 1 tablet (1,000 mcg total) by mouth daily. 06/26/16  Yes Short, Noah Delaine, MD  zolpidem (AMBIEN) 10 MG tablet take 1 tablet by mouth at bedtime for sleep if needed. 02/08/17  Yes Shawnee Knapp, MD   Allergies  Allergen Reactions  . Compazine     Bad headache, vomiting   . Cymbalta [Duloxetine Hcl]     unknown  . Escitalopram Oxalate Other (See Comments)    Can't sleep  . Methadone Hives  . Nitrofuran Derivatives     unknown  . Macrobid [Nitrofurantoin Monohyd Macro] Rash   Review of Systems  Constitutional:  Negative for fatigue and unexpected weight change.  Respiratory: Negative for chest tightness and shortness of breath.   Cardiovascular: Negative for chest pain, palpitations and leg swelling.  Gastrointestinal: Negative for abdominal pain and blood in stool.  Neurological: Negative for dizziness, syncope, light-headedness and headaches.  Psychiatric/Behavioral: Positive for sleep disturbance.       Objective:   Physical Exam  Constitutional: She is oriented to person, place, and time. She appears well-developed and well-nourished. No distress.  HENT:  Head: Normocephalic and atraumatic.  Eyes: Pupils are equal, round, and reactive to light. EOM are normal.  Neck: Neck supple.  Cardiovascular: Normal rate, regular rhythm, S1 normal, S2 normal and normal heart sounds.   No murmur heard. Pulmonary/Chest: Effort normal and breath sounds normal. No respiratory distress. She has no decreased breath sounds.  Musculoskeletal: Normal range of motion.  Neurological: She is alert and oriented to person, place, and time.  Skin: Skin is warm and dry.  Psychiatric: She has a normal mood and affect. Her behavior is normal.  Nursing note and vitals reviewed.   BP (!) 151/77   Pulse 90   Temp 98.4 F (36.9 C) (Oral)   Resp 16   Ht '4\' 8"'  (1.422 m)   Wt 166 lb 3.2 oz (75.4 kg)   SpO2 95%   BMI 37.26 kg/m   Results for orders placed or performed in visit on 04/12/17  POCT glycosylated hemoglobin (Hb A1C)  Result Value Ref Range   Hemoglobin A1C 7.5        Assessment & Plan:   1. Type 2 diabetes mellitus with other diabetic kidney complication, without long-term current use of insulin (HCC) - work on diet/exercise - if not improved at f/u - will increase glipizide, pt declines to do this today  2. Essential hypertension   3. Intractable chronic migraine without aura and without status migrainosus - refilled stadol 1 bottle/wk - ok to call for refills  4. Other specified hypothyroidism -  stable on levothyroxine 75 mcg for years  5. Stage 3 chronic kidney disease - wide variation in baseline for 3 years with freq decreases with pyelo, nepholithiasis, etc but was stellar at last visit 3 mos prior with Cr 1.05 and eGFR 55 - baseline Cr 1.2 with eGFR 46. 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  6. Hyperlipidemia, unspecified hyperlipidemia type   7. Insomnia, unspecified type - failed numerous other trials of med, works well for her  8. Iron deficiency anemia, unspecified iron deficiency anemia type   9. Vitamin B12 deficiency (  dietary) anemia   10.    Seizure d/o - on phenobarbital for years. Last level checked 08/2014 - need to check level at next OV.    F/u in 4 mos.  Orders Placed This Encounter  Procedures  . Comprehensive metabolic panel  . TSH  . POCT glycosylated hemoglobin (Hb A1C)    Meds ordered this encounter  Medications  . losartan (COZAAR) 50 MG tablet    Sig: Take 1 tablet (50 mg total) by mouth daily.    Dispense:  90 tablet    Refill:  3  . butorphanol (STADOL) 10 MG/ML nasal spray    Sig: Place 1 spray into the nose every 4 (four) hours as needed for migraine. May refill weekly.    Dispense:  2.5 mL    Refill:  5  . zolpidem (AMBIEN) 10 MG tablet    Sig: take 1 tablet by mouth at bedtime for sleep if needed.    Dispense:  90 tablet    Refill:  1    I personally performed the services described in this documentation, which was scribed in my presence. The recorded information has been reviewed and considered, and addended by me as needed.   Delman Cheadle, M.D.  Primary Care at [facility] [address] [place], [place] [address] [phone] phone [phone] fax  04/15/17 2:37 PM

## 2017-04-12 NOTE — Patient Instructions (Signed)
     IF you received an x-ray today, you will receive an invoice from [facility]. Please contact [facility] at [phone] with questions or concerns regarding your invoice.   IF you received labwork today, you will receive an invoice from [facility]. Please contact [facility] at [phone] with questions or concerns regarding your invoice.   Our billing staff will not be able to assist you with questions regarding bills from these companies.  You will be contacted with the lab results as soon as they are available. The fastest way to get your results is to activate your My Chart account. Instructions are located on the last page of this paperwork. If you have not heard from us regarding the results in 2 weeks, please contact this office.     

## 2017-04-13 LAB — COMPREHENSIVE METABOLIC PANEL
ALBUMIN: 4.4 g/dL (ref 3.6–4.8)
ALK PHOS: 82 IU/L (ref 39–117)
ALT: 16 IU/L (ref 0–32)
AST: 19 IU/L (ref 0–40)
Albumin/Globulin Ratio: 1.8 (ref 1.2–2.2)
BUN / CREAT RATIO: 17 (ref 12–28)
BUN: 24 mg/dL (ref 8–27)
CHLORIDE: 106 mmol/L (ref 96–106)
CO2: 26 mmol/L (ref 20–29)
Calcium: 10.1 mg/dL (ref 8.7–10.3)
Creatinine, Ser: 1.42 mg/dL — ABNORMAL HIGH (ref 0.57–1.00)
GFR calc Af Amer: 44 mL/min/{1.73_m2} — ABNORMAL LOW (ref >59)
GFR calc non Af Amer: 38 mL/min/{1.73_m2} — ABNORMAL LOW (ref >59)
GLUCOSE: 190 mg/dL — AB (ref 65–99)
Globulin, Total: 2.5 g/dL (ref 1.5–4.5)
Potassium: 4.8 mmol/L (ref 3.5–5.2)
Sodium: 144 mmol/L (ref 134–144)
Total Protein: 6.9 g/dL (ref 6.0–8.5)

## 2017-04-13 LAB — TSH: TSH: 2.9 u[IU]/mL (ref 0.450–4.500)

## 2017-04-24 ENCOUNTER — Other Ambulatory Visit: Payer: Self-pay | Admitting: Family Medicine

## 2017-04-30 ENCOUNTER — Other Ambulatory Visit: Payer: Self-pay | Admitting: Family Medicine

## 2017-05-01 ENCOUNTER — Other Ambulatory Visit: Payer: Self-pay | Admitting: Family Medicine

## 2017-05-29 DIAGNOSIS — Z23 Encounter for immunization: Secondary | ICD-10-CM | POA: Diagnosis not present

## 2017-06-11 ENCOUNTER — Telehealth: Payer: Self-pay | Admitting: Family Medicine

## 2017-06-11 NOTE — Telephone Encounter (Signed)
Pt is needing a refill of her stadol  Best number [phone]

## 2017-06-17 ENCOUNTER — Other Ambulatory Visit: Payer: Self-pay | Admitting: Family Medicine

## 2017-06-17 MED ORDER — BUTORPHANOL TARTRATE 10 MG/ML NA SOLN: 1.0000 | NASAL | 5 refills | Status: DC | PRN

## 2017-06-17 NOTE — Telephone Encounter (Signed)
Ready for pick up

## 2017-06-18 NOTE — Telephone Encounter (Signed)
I called pt yo let her know that her prescription is ready to be be picked up from our office. Pt states thanks

## 2017-07-01 ENCOUNTER — Other Ambulatory Visit: Payer: Self-pay | Admitting: Family Medicine

## 2017-07-01 NOTE — Telephone Encounter (Signed)
Please advise 

## 2017-07-29 ENCOUNTER — Encounter: Payer: Self-pay | Admitting: Family Medicine

## 2017-07-29 ENCOUNTER — Ambulatory Visit (INDEPENDENT_AMBULATORY_CARE_PROVIDER_SITE_OTHER): Payer: Medicare Other | Admitting: Family Medicine

## 2017-07-29 VITALS — BP 122/58 | HR 78 | Temp 98.5°F | Resp 16 | Ht <= 58 in | Wt 167.4 lb

## 2017-07-29 DIAGNOSIS — N183 Chronic kidney disease, stage 3 unspecified: Secondary | ICD-10-CM

## 2017-07-29 DIAGNOSIS — E1129 Type 2 diabetes mellitus with other diabetic kidney complication: Secondary | ICD-10-CM

## 2017-07-29 DIAGNOSIS — E785 Hyperlipidemia, unspecified: Secondary | ICD-10-CM | POA: Diagnosis not present

## 2017-07-29 DIAGNOSIS — I10 Essential (primary) hypertension: Secondary | ICD-10-CM | POA: Diagnosis not present

## 2017-07-29 DIAGNOSIS — G40909 Epilepsy, unspecified, not intractable, without status epilepticus: Secondary | ICD-10-CM | POA: Diagnosis not present

## 2017-07-29 DIAGNOSIS — G4700 Insomnia, unspecified: Secondary | ICD-10-CM | POA: Diagnosis not present

## 2017-07-29 DIAGNOSIS — N390 Urinary tract infection, site not specified: Secondary | ICD-10-CM | POA: Diagnosis not present

## 2017-07-29 DIAGNOSIS — R809 Proteinuria, unspecified: Secondary | ICD-10-CM | POA: Diagnosis not present

## 2017-07-29 DIAGNOSIS — E038 Other specified hypothyroidism: Secondary | ICD-10-CM | POA: Diagnosis not present

## 2017-07-29 DIAGNOSIS — Z5181 Encounter for therapeutic drug level monitoring: Secondary | ICD-10-CM

## 2017-07-29 LAB — POCT URINALYSIS DIP (MANUAL ENTRY)
Bilirubin, UA: NEGATIVE
Blood, UA: NEGATIVE
Glucose, UA: NEGATIVE mg/dL
Ketones, POC UA: NEGATIVE mg/dL
NITRITE UA: NEGATIVE
Spec Grav, UA: 1.025 (ref 1.010–1.025)
UROBILINOGEN UA: 0.2 U/dL
pH, UA: 5.5 (ref 5.0–8.0)

## 2017-07-29 LAB — POCT CBC
Granulocyte percent: 64.2 %G (ref 37–80)
HCT, POC: 39.3 % (ref 37.7–47.9)
HEMOGLOBIN: 13.1 g/dL (ref 12.2–16.2)
LYMPH, POC: 1.8 (ref 0.6–3.4)
MCH, POC: 31.3 pg — AB (ref 27–31.2)
MCHC: 33.4 g/dL (ref 31.8–35.4)
MCV: 93.9 fL (ref 80–97)
MID (cbc): 0.4 (ref 0–0.9)
MPV: 7.3 fL (ref 0–99.8)
PLATELET COUNT, POC: 305 10*3/uL (ref 142–424)
POC Granulocyte: 3.9 (ref 2–6.9)
POC LYMPH %: 29.9 % (ref 10–50)
POC MID %: 5.9 %M (ref 0–12)
RBC: 4.18 M/uL (ref 4.04–5.48)
RDW, POC: 13.8 %
WBC: 6 10*3/uL (ref 4.6–10.2)

## 2017-07-29 LAB — POC MICROSCOPIC URINALYSIS (UMFC): MUCUS RE: ABSENT

## 2017-07-29 LAB — POCT GLYCOSYLATED HEMOGLOBIN (HGB A1C): Hemoglobin A1C: 8

## 2017-07-29 MED ORDER — BUTORPHANOL TARTRATE 10 MG/ML NA SOLN: 1.0000 | NASAL | 5 refills | Status: DC | PRN

## 2017-07-29 MED ORDER — ZOLPIDEM TARTRATE 10 MG PO TABS: ORAL_TABLET | ORAL | 0 refills | Status: DC

## 2017-07-29 MED ORDER — GLIPIZIDE 5 MG PO TABS: ORAL_TABLET | ORAL | 0 refills | Status: DC

## 2017-07-29 MED ORDER — RAMELTEON 8 MG PO TABS: 8.0000 mg | ORAL_TABLET | Freq: Every day | ORAL | 0 refills | Status: DC

## 2017-07-29 MED ORDER — CLOBETASOL PROPIONATE 0.05 % EX CREA: TOPICAL_CREAM | CUTANEOUS | 0 refills | Status: DC

## 2017-07-29 MED ORDER — TERCONAZOLE 0.8 % VA CREA: 1.0000 | TOPICAL_CREAM | Freq: Every day | VAGINAL | 0 refills | Status: DC

## 2017-07-29 NOTE — Patient Instructions (Signed)
     IF you received an x-ray today, you will receive an invoice from [facility]. Please contact [facility] at [phone] with questions or concerns regarding your invoice.   IF you received labwork today, you will receive an invoice from [facility]. Please contact [facility] at [phone] with questions or concerns regarding your invoice.   Our billing staff will not be able to assist you with questions regarding bills from these companies.  You will be contacted with the lab results as soon as they are available. The fastest way to get your results is to activate your My Chart account. Instructions are located on the last page of this paperwork. If you have not heard from us regarding the results in 2 weeks, please contact this office.     

## 2017-07-29 NOTE — Progress Notes (Signed)
Subjective:     Patient ID: Natalie [name], female    DOB: [birth year], 66 y.o.   MRN: [id number] Chief Complaint  Patient presents with  . Medication Refill   HPI Natalie [name] is a 66 y.o. female who presents to Primary Care at [facility] for follow up and medications refill.   DMII: Diagnosed .   Lab Results  Component Value Date   HGBA1C 8.0 07/29/2017   HGBA1C 7.5 04/12/2017   HGBA1C 6.9 (H) 12/27/2016   CBGs: fasting a.m. ; after meal  ; No hypoglycemic episodes.  Meter type:  Diet: will typically eat an apple w/ peanut butter around 2pm and then will fix dinner - steak, green beans. willl have cream in coffee in the morning, drinks sprite 0 and water during day.  Exercising:  DM Med Regimen: glipizide 24m bid.  Prior changes:    eGFR:  Baseline Cr:  Last checked . Microalb: Done . Normal. On acei  Lipids:  LDL ,  non-HDL .  Last levels done  - were improving from prior. On statin. Not taking asa 81 qd.  Optho: Seen annually by Dr. SMarica Otter- last exam Feb, 2018 Feet: Monofilament exam done . Denies any no problems.  Not seen by podiatry prior.  Immunizations:  Influenza:  Pneumovax-23:   Her A1C was 6.9 in March, but today, it was 7.5. She denies symptomatic lows.   HTN: Checks BP occ [place] 120[place]-140s/70s.  Her home BP readings usually run in the 140s/70s. She was taken off BP medications due to kidney problems, but will restart. She has occasional migraines when things get piled up. She's been off of her omeprazole; denies heartburn. She denies any abdominal issues.   She was taking zolpidem for insomnia, but pharmacy switched brands. She states that it doesn't work as well.   She is still taking her B-12 supplement.   Family Her son was recently in a motorcycle accident, and suffered punctured lung.  Her granddaughter graduated high school and will work in [employer] [place] [employer]; will attend college this year.    Past Medical History:  Diagnosis Date  . Chronic kidney disease    kidney stones, right and left  . Diabetes mellitus without complication ([place]   . Headache    migraines  . Hyperlipidemia   . Hypertension   . Iron deficiency anemia   . Renal stone   . Seizures ([place]    none x 30 yrs   Prior to Admission medications   Medication Sig Start Date End Date Taking? Authorizing Provider  amLODipine (NORVASC) 5 MG tablet Take 1 tablet (5 mg total) by mouth daily. 10/22/16  Yes SShawnee Knapp MD  atorvastatin (LIPITOR) 20 MG tablet Take 1 tablet (20 mg total) by mouth daily. 10/22/16  Yes SShawnee Knapp MD  butorphanol (STADOL) 10 MG/ML nasal spray Place 1 spray into the nose every 4 (four) hours as needed for migraine. May refill weekly. 03/21/17  Yes SShawnee Knapp MD  clobetasol cream (TEMOVATE) 06.07% Apply 1 application topically daily as needed. 01/21/17  Yes SShawnee Knapp MD  glipiZIDE (GLUCOTROL) 5 MG tablet TAKE 1 TABLET TWICE DAILY BEFORE A MEAL 04/04/17  Yes SShawnee Knapp MD  lactobacillus acidophilus (BACID) TABS tablet Take 2 tablets by mouth 3 (three) times daily.   Yes [provider]  levothyroxine (SYNTHROID, LEVOTHROID) 75 MCG tablet Take 1 tablet (75 mcg total) by  mouth daily. 10/22/16  Yes Shawnee Knapp, MD  omeprazole (PRILOSEC) 20 MG capsule Take 1 capsule (20 mg total) by mouth 2 (two) times daily before a meal. 07/05/16  Yes Shawnee Knapp, MD  ondansetron (ZOFRAN-ODT) 8 MG disintegrating tablet DISSOLVE 1 TABLET ON OR UNDER THE TONGUE EVERY 8 HOURS AS NEEDED FOR NAUSEA 11/19/16  Yes Shawnee Knapp, MD  PHENobarbital (LUMINAL) 97.2 MG tablet Take 1-2 tablets (97.2-194.4 mg total) by mouth daily. TAKE TWO TABLETS BY MOUTH ONCE DAILY ON  MON,  WED,  AND  FRI  AND  1  TABLET  DAILY  ON  OTHER  DAYS 12/27/16  Yes Shawnee Knapp, MD  polycarbophil (FIBERCON) 625 MG tablet Take 625 mg by mouth daily.   Yes [provider]  terconazole (TERAZOL 3) 0.8 % vaginal cream Place 1 applicator  vaginally at bedtime. 3 times a week. 10/22/16  Yes Shawnee Knapp, MD  vitamin B-12 1000 MCG tablet Take 1 tablet (1,000 mcg total) by mouth daily. 06/26/16  Yes Short, Noah Delaine, MD  zolpidem (AMBIEN) 10 MG tablet take 1 tablet by mouth at bedtime for sleep if needed. 02/08/17  Yes Shawnee Knapp, MD   Allergies  Allergen Reactions  . Compazine     Bad headache, vomiting   . Cymbalta [Duloxetine Hcl]     unknown  . Escitalopram Oxalate Other (See Comments)    Can't sleep  . Methadone Hives  . Nitrofuran Derivatives     unknown  . Macrobid [Nitrofurantoin Monohyd Macro] Rash   Past Surgical History:  Procedure Laterality Date  . CESAREAN SECTION    . CHOLECYSTECTOMY    . KIDNEY STONE SURGERY    . NEPHROLITHOTOMY Right 05/14/2016   Procedure: NEPHROLITHOTOMY PERCUTANEOUS;  Surgeon: Franchot Gallo, MD;  Location: [facility];  Service: Urology;  Laterality: Right;  . SHOULDER ARTHROSCOPY Bilateral    Family History  Problem Relation Age of Onset  . Heart disease Father   . Diabetes Father    Social History   Social History  . Marital status: Married    Spouse name: N/A  . Number of children: N/A  . Years of education: N/A   Social History Main Topics  . Smoking status: Never Smoker  . Smokeless tobacco: Never Used  . Alcohol use No  . Drug use: No  . Sexual activity: Not Asked   Other Topics Concern  . None   Social History Narrative  . None   Depression screen [facility] 2/9 07/29/2017 04/12/2017 12/27/2016 10/22/2016 09/06/2016  Decreased Interest 0 0 0 0 0  Down, Depressed, Hopeless 0 0 0 0 0  PHQ - 2 Score 0 0 0 0 0    Review of Systems  Constitutional: Negative for fatigue and unexpected weight change.  Respiratory: Negative for chest tightness and shortness of breath.   Cardiovascular: Negative for chest pain, palpitations and leg swelling.  Gastrointestinal: Negative for abdominal pain and blood in stool.  Neurological: Negative for dizziness, syncope, light-headedness  and headaches.  Psychiatric/Behavioral: Positive for sleep disturbance.       Objective:   Physical Exam  Constitutional: She is oriented to person, place, and time. She appears well-developed and well-nourished. No distress.  HENT:  Head: Normocephalic and atraumatic.  Eyes: Pupils are equal, round, and reactive to light. EOM are normal.  Neck: Neck supple.  Cardiovascular: Normal rate, regular rhythm, S1 normal, S2 normal and normal heart sounds.   No murmur heard. Pulmonary/Chest: Effort normal  and breath sounds normal. No respiratory distress. She has no decreased breath sounds.  Musculoskeletal: Normal range of motion.  Neurological: She is alert and oriented to person, place, and time.  Skin: Skin is warm and dry.  Psychiatric: She has a normal mood and affect. Her behavior is normal.  Nursing note and vitals reviewed.   BP (!) 122/58   Pulse 78   Temp 98.5 F (36.9 C)   Resp 16   Ht 4' 8" (1.422 m)   Wt 167 lb 6.4 oz (75.9 kg)   SpO2 96%   BMI 37.53 kg/m   Results for orders placed or performed in visit on 07/29/17  POCT glycosylated hemoglobin (Hb A1C)  Result Value Ref Range   Hemoglobin A1C 8.0   POCT urinalysis dipstick  Result Value Ref Range   Color, UA yellow yellow   Clarity, UA clear clear   Glucose, UA negative negative mg/dL   Bilirubin, UA negative negative   Ketones, POC UA negative negative mg/dL   Spec Grav, UA 1.025 1.010 - 1.025   Blood, UA negative negative   pH, UA 5.5 5.0 - 8.0   Protein Ur, POC trace (A) negative mg/dL   Urobilinogen, UA 0.2 0.2 or 1.0 E.U./dL   Nitrite, UA Negative Negative   Leukocytes, UA Small (1+) (A) Negative  POCT CBC  Result Value Ref Range   WBC 6.0 4.6 - 10.2 K/uL   Lymph, poc 1.8 0.6 - 3.4   POC LYMPH PERCENT 29.9 10 - 50 %L   MID (cbc) 0.4 0 - 0.9   POC MID % 5.9 0 - 12 %M   POC Granulocyte 3.9 2 - 6.9   Granulocyte percent 64.2 37 - 80 %G   RBC 4.18 4.04 - 5.48 M/uL   Hemoglobin 13.1 12.2 - 16.2  g/dL   HCT, POC 39.3 37.7 - 47.9 %   MCV 93.9 80 - 97 fL   MCH, POC 31.3 (A) 27 - 31.2 pg   MCHC 33.4 31.8 - 35.4 g/dL   RDW, POC 13.8 %   Platelet Count, POC 305 142 - 424 K/uL   MPV 7.3 0 - 99.8 fL       Assessment & Plan:  Worked on PA for Zolpidem Tartrate 10 mg tablet for patient, yesterday.  I was told her request had been denied because it was now a step therapy program and she needed to try Rozerem for 3 months before they would approve the Zolpidem tartrate.   1. Type 2 diabetes mellitus with other diabetic kidney complication, without long-term current use of insulin ([place])   2. Other specified hypothyroidism   3. 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 Essential hypertension   5. Seizure disorder ([place])   6. Recurrent UTI   7. Stage 3 chronic kidney disease ([place])   8. Hyperlipidemia, unspecified hyperlipidemia type   9. Insomnia, unspecified type   10. Medication monitoring encounter     Orders Placed This Encounter  Procedures  . Phenobarbital level  . Comprehensive metabolic panel  . TSH  . Microalbumin/Creatinine Ratio, Urine  . POCT glycosylated hemoglobin (Hb A1C)  . POCT urinalysis dipstick  . POCT Microscopic Urinalysis ([facility])  . POCT CBC    Meds ordered this encounter  Medications  . glipiZIDE (GLUCOTROL) 5 MG tablet    Sig: Take 1 tab po qam and 2 tabs po before dinner    Dispense:  270 tablet    Refill:  0  . terconazole (TERAZOL 3) 0.8 % vaginal cream    Sig: Place 1 applicator vaginally at bedtime. 3 times a week.    Dispense:  80 g    Refill:  0  . butorphanol (STADOL) 10 MG/ML nasal spray    Sig: Place 1 spray into the nose every 4 (four) hours as needed for migraine. May refill weekly.    Dispense:  2.5 mL    Refill:  5  . clobetasol cream (TEMOVATE) 0.05 %    Sig: APPLY TO AFFECTED AREA ONCE EVERY DAY AS NEEDED    Dispense:  60 g    Refill:  0  . ramelteon (ROZEREM) 8 MG tablet    Sig: Take 1  tablet (8 mg total) by mouth at bedtime.    Dispense:  90 tablet    Refill:  0  . zolpidem (AMBIEN) 10 MG tablet    Sig: take 1 tablet by mouth at bedtime for sleep if needed.    Dispense:  90 tablet    Refill:  0    Delman Cheadle, M.D.  Primary Care at [facility] [address] [place], [place] [address] [phone] phone [phone] fax  07/29/17 3:03 PM

## 2017-07-30 LAB — TSH: TSH: 3.3 u[IU]/mL (ref 0.450–4.500)

## 2017-07-30 LAB — COMPREHENSIVE METABOLIC PANEL
ALBUMIN: 4.5 g/dL (ref 3.6–4.8)
ALT: 13 IU/L (ref 0–32)
AST: 15 IU/L (ref 0–40)
Albumin/Globulin Ratio: 1.6 (ref 1.2–2.2)
Alkaline Phosphatase: 90 IU/L (ref 39–117)
BUN/Creatinine Ratio: 16 (ref 12–28)
BUN: 20 mg/dL (ref 8–27)
Bilirubin Total: <0.2 mg/dL (ref 0.0–1.2)
CO2: 23 mmol/L (ref 20–29)
CREATININE: 1.22 mg/dL — AB (ref 0.57–1.00)
Calcium: 10 mg/dL (ref 8.7–10.3)
Chloride: 104 mmol/L (ref 96–106)
GFR, EST AFRICAN AMERICAN: 53 mL/min/{1.73_m2} — AB (ref >59)
GFR, EST NON AFRICAN AMERICAN: 46 mL/min/{1.73_m2} — AB (ref >59)
GLOBULIN, TOTAL: 2.8 g/dL (ref 1.5–4.5)
Glucose: 168 mg/dL — ABNORMAL HIGH (ref 65–99)
Potassium: 4.9 mmol/L (ref 3.5–5.2)
SODIUM: 142 mmol/L (ref 134–144)
TOTAL PROTEIN: 7.3 g/dL (ref 6.0–8.5)

## 2017-07-30 LAB — MICROALBUMIN / CREATININE URINE RATIO
Creatinine, Urine: 132 mg/dL
MICROALB/CREAT RATIO: 41.7 mg/g{creat} — AB (ref 0.0–30.0)
Microalbumin, Urine: 55 ug/mL

## 2017-07-30 LAB — PHENOBARBITAL LEVEL: Phenobarbital, Serum: 23 ug/mL (ref 15–40)

## 2017-07-31 ENCOUNTER — Telehealth: Payer: Self-pay | Admitting: Family Medicine

## 2017-07-31 NOTE — Telephone Encounter (Signed)
Pharmacy called stating that when they pulled up the RX for Ramelteon it showed up on there screen that it had two or three interactions they would like for Dr Natalie [name] to give them a call back

## 2017-08-02 NOTE — Telephone Encounter (Signed)
Please advise 

## 2017-08-05 NOTE — Telephone Encounter (Addendum)
The rozerem was $527.  We tried it because her insurance demands that she fail that before they will approve the zolpidem - however, it is unaffordable since she is in the [place] whole.  Will just have to pay for zolpidem out of pocket.

## 2017-08-29 ENCOUNTER — Other Ambulatory Visit: Payer: Self-pay | Admitting: Family Medicine

## 2017-08-29 NOTE — Telephone Encounter (Signed)
Pt  Requesting a  Refill  Of  Stadol    Nasal  Spray

## 2017-08-29 NOTE — Telephone Encounter (Signed)
Copied from [place]. Topic: Quick Communication - See Telephone Encounter >> Aug 29, 2017  1:13 PM Hewitt Shorts wrote: CRM for notification. See Telephone encounter for:  Pt states that she needs a refill on stadol nasal spray  08/29/17.

## 2017-08-30 NOTE — Telephone Encounter (Signed)
Pt called requesting Stadol nasal spray refill. Sent to Dr Brigitte Pulse.

## 2017-08-31 NOTE — Telephone Encounter (Signed)
I will not be able to print off stadol refill until I am back in the office next week. Please let pt know she can pick up a new rx for this on Tuesday 12/4.

## 2017-09-02 MED ORDER — BUTORPHANOL TARTRATE 10 MG/ML NA SOLN: 1.0000 | NASAL | 5 refills | Status: DC | PRN

## 2017-09-02 NOTE — Telephone Encounter (Signed)
Patient informed. 

## 2017-09-03 NOTE — Telephone Encounter (Signed)
Prescription for Stadol 10mg /ml placed up at front desk for patient pickup.

## 2017-09-14 ENCOUNTER — Other Ambulatory Visit: Payer: Self-pay | Admitting: Family Medicine

## 2017-09-16 NOTE — Telephone Encounter (Signed)
Please review. Requesting refill on Clobetasol, no refill.

## 2017-09-18 ENCOUNTER — Other Ambulatory Visit: Payer: Self-pay | Admitting: Family Medicine

## 2017-10-01 ENCOUNTER — Other Ambulatory Visit: Payer: Self-pay | Admitting: Family Medicine

## 2017-10-08 ENCOUNTER — Telehealth: Payer: Self-pay

## 2017-10-08 NOTE — Telephone Encounter (Signed)
The pharmacy called for Rx clarification there are two sets of directions for the patients glipizide one states to take one tablet twice daily before a meal and the other one states take one during the day and then 2 in the evening with dinner. Which one is correct?

## 2017-10-09 ENCOUNTER — Telehealth: Payer: Self-pay | Admitting: Family Medicine

## 2017-10-09 MED ORDER — GLIPIZIDE 5 MG PO TABS: ORAL_TABLET | ORAL | 0 refills | Status: DC

## 2017-10-09 NOTE — Addendum Note (Signed)
Addended by: Shawnee Knapp on: 10/09/2017 10:08 AM   Modules accepted: Orders

## 2017-10-09 NOTE — Telephone Encounter (Signed)
Called [facility] in [place]; inquired about refills on Butorphanol Nasal Spray.  Was advised the pt's. Last refill is being done today, and then she'll need new Rx.  Will send message to the office.

## 2017-10-09 NOTE — Telephone Encounter (Addendum)
Looks like the instructions for bid was sent in by one of the [facility] RNs - likely standard refill request response.  The instructions for 1 qam and 2 with dinner was rx'd by myself at pt's most recent OV so that would be the correct one. Sent in rx again with correct instructions.

## 2017-10-09 NOTE — Telephone Encounter (Signed)
Copied from [place] [id number]. Topic: Quick Communication - Rx Refill/Question >> Oct 09, 2017 10:23 AM Celedonio Savage L wrote: Medication:   butorphanol (STADOL) 10 MG/ML nasal spray     glipiZIDE (GLUCOTROL) 5 MG tablet   Has the patient contacted their pharmacy? Yes.  Patient states that they have called their pharmacy   (Agent: If no, request that the patient contact the pharmacy for the refill.)   Preferred Pharmacy (with phone number or street name): [id number] [place], [place] [phone] (Phone) [phone] (Fax)     Agent: Please be advised that RX refills may take up to 3 business days. We ask that you follow-up with your pharmacy.

## 2017-10-14 ENCOUNTER — Other Ambulatory Visit: Payer: Self-pay | Admitting: Family Medicine

## 2017-10-15 NOTE — Telephone Encounter (Signed)
Pt came in checking on status of this message. Please advise at [phone]

## 2017-10-15 NOTE — Telephone Encounter (Signed)
Nasal Stadol request

## 2017-10-17 ENCOUNTER — Ambulatory Visit: Payer: Medicare Other | Admitting: Family Medicine

## 2017-11-20 ENCOUNTER — Telehealth: Payer: Self-pay | Admitting: Family Medicine

## 2017-11-20 NOTE — Telephone Encounter (Signed)
Copied from [place]. Topic: Quick Communication - Rx Refill/Question >> Nov 20, 2017 10:21 AM Corie Chiquito, NT wrote: Medication:Butorphanol 10 mg   Has the patient contacted their pharmacy? No   Patient calling because she needs a refill on the above medication. I did advise patient to contact her pharmacy as well for all refill request  Preferred Pharmacy (with phone number or street name[facility] [place] [phone]  Agent: Please be advised that RX refills may take up to 3 business days. We ask that you follow-up with your pharmacy.

## 2017-11-20 NOTE — Telephone Encounter (Signed)
Butorphanol refill  Last OV: 07/29/17 Last Refill:10/17/17 [facility]  [facility] [place]

## 2017-11-20 NOTE — Telephone Encounter (Signed)
Refill req Butorphanol sent to Dr. Brigitte Pulse.

## 2017-11-21 DIAGNOSIS — H4720 Unspecified optic atrophy: Secondary | ICD-10-CM | POA: Diagnosis not present

## 2017-11-21 DIAGNOSIS — H52223 Regular astigmatism, bilateral: Secondary | ICD-10-CM | POA: Diagnosis not present

## 2017-11-21 DIAGNOSIS — H35042 Retinal micro-aneurysms, unspecified, left eye: Secondary | ICD-10-CM | POA: Diagnosis not present

## 2017-11-21 DIAGNOSIS — H5213 Myopia, bilateral: Secondary | ICD-10-CM | POA: Diagnosis not present

## 2017-11-21 DIAGNOSIS — H25813 Combined forms of age-related cataract, bilateral: Secondary | ICD-10-CM | POA: Diagnosis not present

## 2017-11-21 DIAGNOSIS — E119 Type 2 diabetes mellitus without complications: Secondary | ICD-10-CM | POA: Diagnosis not present

## 2017-11-22 MED ORDER — BUTORPHANOL TARTRATE 10 MG/ML NA SOLN: NASAL | 5 refills | Status: DC

## 2017-11-22 NOTE — Telephone Encounter (Signed)
Sent to pharmacy 

## 2017-11-23 ENCOUNTER — Ambulatory Visit (INDEPENDENT_AMBULATORY_CARE_PROVIDER_SITE_OTHER): Payer: Medicare Other

## 2017-11-23 ENCOUNTER — Other Ambulatory Visit: Payer: Self-pay

## 2017-11-23 ENCOUNTER — Ambulatory Visit (INDEPENDENT_AMBULATORY_CARE_PROVIDER_SITE_OTHER): Payer: Medicare Other | Admitting: Family Medicine

## 2017-11-23 ENCOUNTER — Encounter: Payer: Self-pay | Admitting: Family Medicine

## 2017-11-23 VITALS — BP 140/76 | HR 74 | Temp 98.4°F | Resp 18 | Ht <= 58 in | Wt 165.8 lb

## 2017-11-23 DIAGNOSIS — E1129 Type 2 diabetes mellitus with other diabetic kidney complication: Secondary | ICD-10-CM

## 2017-11-23 DIAGNOSIS — R200 Anesthesia of skin: Secondary | ICD-10-CM

## 2017-11-23 DIAGNOSIS — M25511 Pain in right shoulder: Secondary | ICD-10-CM | POA: Diagnosis not present

## 2017-11-23 DIAGNOSIS — R202 Paresthesia of skin: Secondary | ICD-10-CM

## 2017-11-23 DIAGNOSIS — M542 Cervicalgia: Secondary | ICD-10-CM

## 2017-11-23 DIAGNOSIS — N183 Chronic kidney disease, stage 3 unspecified: Secondary | ICD-10-CM

## 2017-11-23 DIAGNOSIS — M4802 Spinal stenosis, cervical region: Secondary | ICD-10-CM | POA: Diagnosis not present

## 2017-11-23 DIAGNOSIS — G4700 Insomnia, unspecified: Secondary | ICD-10-CM | POA: Diagnosis not present

## 2017-11-23 DIAGNOSIS — M19011 Primary osteoarthritis, right shoulder: Secondary | ICD-10-CM | POA: Diagnosis not present

## 2017-11-23 LAB — POCT GLYCOSYLATED HEMOGLOBIN (HGB A1C): Hemoglobin A1C: 8.4

## 2017-11-23 LAB — GLUCOSE, POCT (MANUAL RESULT ENTRY): POC Glucose: 152 mg/dl — AB (ref 70–99)

## 2017-11-23 MED ORDER — ZOLPIDEM TARTRATE 10 MG PO TABS: ORAL_TABLET | ORAL | 1 refills | Status: DC

## 2017-11-23 MED ORDER — TIZANIDINE HCL 4 MG PO TABS: 4.0000 mg | ORAL_TABLET | Freq: Three times a day (TID) | ORAL | 0 refills | Status: DC | PRN

## 2017-11-23 NOTE — Progress Notes (Addendum)
By signing my name below, I, [name], attest that this documentation has been prepared under the direction and in the presence of [name] Arrow, MD. Electronically Signed: [name], Medical Scribe 11/23/2017 at 2:43 PM. Subjective:    Patient ID: Natalie [name], female    DOB: [birth year], 67 y.o.   MRN: [id number]  HPI Chief Complaint  Patient presents with  . Shoulder Pain    x1 month, right shoulder, pt states to move arm up and down. Pt states sometimes it hurts to the touch. Pt states for an example that it hurts to take the dishes out of the dishwasher and put them up in the cabinet.   Natalie [name] is a 67 y.o. female who presents to Primary Care at [facility] complaining of issues as listed below.  R shoulder: She complains of front and back R shoulder pain (proximal over the [facility] joint that spreads to the acromion). This has been going on for 1 month. Per pt, it took maybe 3 weeks to worsen. She has associated numbness to her R shoulder and arm and up to her fingertips for 10 days. She states reaching up above makes her pain worse. She has had a h/o rotator cuff surgery (Dr. Tamera Punt, orthopedist). She cannot tell if it is swollen or red. She denies weakness. She has neck pain when she sleeps. She states she has been doing more around the house since her husband is gone a lot recently.   Vision: Per pt, after eye exam, she has a spot in her eye related to her DM2 at recent visit Dr. Marica Otter. Had c/s note sent.  Past Medical History:  Diagnosis Date  . Chronic kidney disease    kidney stones, right and left  . Diabetes mellitus without complication ([place])   . Headache    migraines  . Hyperlipidemia   . Hypertension   . Iron deficiency anemia   . Renal stone   . Seizures ([place])    none x 30 yrs   Past Surgical History:  Procedure Laterality Date  . CESAREAN SECTION    . CHOLECYSTECTOMY    . KIDNEY STONE SURGERY    . NEPHROLITHOTOMY Right 05/14/2016   Procedure:  NEPHROLITHOTOMY PERCUTANEOUS;  Surgeon: Franchot Gallo, MD;  Location: [facility];  Service: Urology;  Laterality: Right;  . SHOULDER ARTHROSCOPY Bilateral    Current Outpatient Medications on File Prior to Visit  Medication Sig Dispense Refill  . amLODipine (NORVASC) 5 MG tablet TAKE 1 TABLET (5 MG TOTAL) BY MOUTH DAILY. 90 tablet 1  . atorvastatin (LIPITOR) 20 MG tablet TAKE 1 TABLET (20 MG TOTAL) BY MOUTH DAILY. 90 tablet 1  . butorphanol (STADOL) 10 MG/ML nasal spray PLACE 1 SPRAY INTO THE NOSE EVERY 4 HOURS AS NEEDED FOR MIGRAINE. 2.5 mL 5  . clobetasol cream (TEMOVATE) 0.05 % APPLY TO AFFECTED AREA ONCE EVERY DAY AS NEEDED 60 g 0  . glipiZIDE (GLUCOTROL) 5 MG tablet Take 1 tab po qam and 2 tabs po before dinner 270 tablet 0  . lactobacillus acidophilus (BACID) TABS tablet Take 2 tablets by mouth 3 (three) times daily.    Marland Kitchen levothyroxine (SYNTHROID, LEVOTHROID) 75 MCG tablet TAKE 1 TABLET (75 MCG TOTAL) BY MOUTH DAILY. 90 tablet 1  . losartan (COZAAR) 50 MG tablet Take 1 tablet (50 mg total) by mouth daily. 90 tablet 3  . ondansetron (ZOFRAN-ODT) 8 MG disintegrating tablet DISSOLVE 1 TABLET ON OR UNDER THE TONGUE EVERY 8 HOURS AS NEEDED FOR  NAUSEA 20 tablet 5  . PHENobarbital (LUMINAL) 97.2 MG tablet TAKE 2 TABLETS BY MOUTH ONCE DAILY ON MON,WED,AND FRI AND 1 TABLET DAILY ON OTHER DAYS 135 tablet 1  . polycarbophil (FIBERCON) 625 MG tablet Take 625 mg by mouth daily.    . ramelteon (ROZEREM) 8 MG tablet Take 1 tablet (8 mg total) by mouth at bedtime. 90 tablet 0  . terconazole (TERAZOL 3) 0.8 % vaginal cream Place 1 applicator vaginally at bedtime. 3 times a week. 80 g 0  . vitamin B-12 1000 MCG tablet Take 1 tablet (1,000 mcg total) by mouth daily. 30 tablet 0  . zolpidem (AMBIEN) 10 MG tablet take 1 tablet by mouth at bedtime for sleep if needed. 90 tablet 0   Current Facility-Administered Medications on File Prior to Visit  Medication Dose Route Frequency Provider Last Rate Last Dose  .  acetaminophen (TYLENOL) tablet 1,000 mg  1,000 mg Oral Once Wendie Agreste, MD       Allergies  Allergen Reactions  . Compazine     Bad headache, vomiting   . Cymbalta [Duloxetine Hcl]     unknown  . Methadone Hives  . Nitrofuran Derivatives     unknown  . Duloxetine Anxiety  . Escitalopram Anxiety  . Macrobid [Nitrofurantoin Monohyd Macro] Rash   Family History  Problem Relation Age of Onset  . Heart disease Father   . Diabetes Father    Social History   Socioeconomic History  . Marital status: Married    Spouse name: Not on file  . Number of children: Not on file  . Years of education: Not on file  . Highest education level: Not on file  Social Needs  . Financial resource strain: Not on file  . Food insecurity - worry: Not on file  . Food insecurity - inability: Not on file  . Transportation needs - medical: Not on file  . Transportation needs - non-medical: Not on file  Occupational History  . Not on file  Tobacco Use  . Smoking status: Never Smoker  . Smokeless tobacco: Never Used  Substance and Sexual Activity  . Alcohol use: No    Alcohol/week: 0.0 oz  . Drug use: No  . Sexual activity: Not on file  Other Topics Concern  . Not on file  Social History Narrative  . Not on file   Depression screen [facility] 2/9 11/23/2017 07/29/2017 04/12/2017 12/27/2016 10/22/2016  Decreased Interest 0 0 0 0 0  Down, Depressed, Hopeless 0 0 0 0 0  PHQ - 2 Score 0 0 0 0 0    Review of Systems  Musculoskeletal: Positive for arthralgias and neck pain.  Neurological: Positive for numbness. Negative for weakness.       Objective:   Physical Exam  Constitutional: She is oriented to person, place, and time. She appears well-developed and well-nourished. No distress.  HENT:  Head: Normocephalic and atraumatic.  Right Ear: External ear normal.  Eyes: Conjunctivae are normal. No scleral icterus.  Pulmonary/Chest: Effort normal.  Musculoskeletal:  Shoulder: She has numbness to  the inner upper aspect of R arm. She has tenderness over her mid clavicle.  Severe point tenderness over the [facility] joint and the tip of the acromion. Very tender over her coracoid. Possible mild edema over [facility] joint and supraclavicular area. Pain with cervical lateral rotation to the R. She has trapezius pain with L lateral flexion but not R. Severe limitation on shoulder raise R worse than L. Proximal over  the [facility] joint, spreads to the chromium. abduct >75 degrees. Flexion 95 degrees. Extension to 30. Adduction 30 degrees. Internal and external minimally limited. Strength 5/5 though she did have pain with biceps and triceps resistance.     Neurological: She is alert and oriented to person, place, and time.  Reflex Scores:      Tricep reflexes are 2+ on the right side and 2+ on the left side.      Bicep reflexes are 2+ on the right side and 2+ on the left side.      Brachioradialis reflexes are 2+ on the right side and 2+ on the left side. Skin: Skin is warm and dry. She is not diaphoretic. No erythema.  Psychiatric: She has a normal mood and affect. Her behavior is normal.    BP 140/76 (BP Location: Left Arm, Patient Position: Sitting, Cuff Size: Large)   Pulse 74   Temp 98.4 F (36.9 C) (Oral)   Resp 18   Ht 4\' 8"  (1.422 m)   Wt 165 lb 12.8 oz (75.2 kg)   SpO2 97%   BMI 37.17 kg/m   Dg Cervical Spine 2 Or 3 Views  Result Date: 11/23/2017 CLINICAL DATA:  Worsening neck pain over the last month. EXAM: CERVICAL SPINE - 2-3 VIEW COMPARISON:  None. FINDINGS: Alignment is normal. No soft tissue swelling. Mild disc space narrowing and endplate osteophytes at C3-4 and C4-5. Other levels appear normal as seen. Lower cervical region partially obscured by shoulder density. IMPRESSION: Mild spondylosis C3-4 and C4-5.  No advanced finding. Electronically Signed   By: Nelson Chimes M.D.   On: 11/23/2017 15:22   Dg Shoulder Right  Result Date: 11/23/2017 CLINICAL DATA:  Pain over the last month which is  worsening. EXAM: RIGHT SHOULDER - 2+ VIEW COMPARISON:  None. FINDINGS: Humeral head is properly located. Glenohumeral distance is normal. There is some osteoarthritis at the [facility] joint. IMPRESSION: Properly located.  No acute finding.  AC joint osteoarthritis. Electronically Signed   By: Nelson Chimes M.D.   On: 11/23/2017 15:23   Results for orders placed or performed in visit on 11/23/17  POCT glycosylated hemoglobin (Hb A1C)  Result Value Ref Range   Hemoglobin A1C 8.4   POCT glucose (manual entry)  Result Value Ref Range   POC Glucose 152 (A) 70 - 99 mg/dl       Assessment & Plan:   1. Numbness and tingling of right upper extremity - suspect sxs are due to cervical spinal nerve root impingement but will refer to PT for treatment as medical treatment very limited as steroids and nsaids not options due to medical comoribidities of uncontrolled DM and CKD3. Try a soft cervical collar x 1-2 wks or horseshoe pillow at night. Start muscle relaxant zanaflex qhs and can use during day tid prn. Can start some gentle stretching. Try top ice or heat.  If worsening at all or no improvement in 6 weeks, call for referral to [place].  2. Acute pain of right shoulder   3. Cervicalgia   4. Type 2 diabetes mellitus with other diabetic kidney complication, without long-term current use of insulin (HCC) - DM worsening - will abstain from systemic steroids, due for OV to reassess w/ foot exam at that time. Will increase glipizide from 5 qam and 10 before supper - will change to ER 20mg . No metformin due to CKD. Had optho eval by Dr. Marica Otter within the past sev wks - in 11/2017 - waiting  for consult note. Lab Results  Component Value Date   HGBA1C 8.4 11/23/2017   HGBA1C 8.0 07/29/2017   HGBA1C 7.5 04/12/2017     5. Stage 3 chronic kidney disease (HCC) - no nsaids  6. Insomnia, unspecified type - dependent upon ambien 10 - prev insurance wanted her to try other options first before they would give  PA on ambien but then pt was never able to fill the rozerem that was on her ins formulary due to drug interactions the pharmacist noticed so she refused to dispense med.   Needs colon cancer screening - discuss at f/u.  Orders Placed This Encounter  Procedures  . DG Shoulder Right    Standing Status:   Future    Number of Occurrences:   1    Standing Expiration Date:   11/23/2018    Order Specific Question:   Reason for Exam (SYMPTOM  OR DIAGNOSIS REQUIRED)    Answer:   pain x 1 mo worsening, nki, Rt clavicle/acromion all tender to palpation, radicular neuropathy sxs, h/o rotator cuff surgery    Order Specific Question:   Preferred imaging location?    Answer:   External  . DG Cervical Spine 2 or 3 views    Standing Status:   Future    Number of Occurrences:   1    Standing Expiration Date:   11/23/2018    Order Specific Question:   Reason for Exam (SYMPTOM  OR DIAGNOSIS REQUIRED)    Answer:   pain x 1 mo worsening, nki, Rt clavicle/acromion all tender to palpation, radicular neuropathy sxs, h/o rotator cuff surgery    Order Specific Question:   Preferred imaging location?    Answer:   External  . Ambulatory referral to Physical Therapy    Referral Priority:   Routine    Referral Type:   Physical Medicine    Referral Reason:   Specialty Services Required    Requested Specialty:   Physical Therapy    Number of Visits Requested:   1  . POCT glycosylated hemoglobin (Hb A1C)  . POCT glucose (manual entry)    Meds ordered this encounter  Medications  . tiZANidine (ZANAFLEX) 4 MG tablet    Sig: Take 1 tablet (4 mg total) by mouth every 8 (eight) hours as needed for muscle spasms. And take every night before bed    Dispense:  60 tablet    Refill:  0  . zolpidem (AMBIEN) 10 MG tablet    Sig: take 1 tablet by mouth at bedtime for sleep if needed.    Dispense:  90 tablet    Refill:  1    I personally performed the services described in this documentation, which was scribed in my  presence. The recorded information has been reviewed and considered, and addended by me as needed.   Delman Cheadle, M.D.  Primary Care at [facility] [address] [place], [place] [address] [phone] phone [phone] fax  11/24/17 5:23 PM

## 2017-11-23 NOTE — Patient Instructions (Addendum)
IF you received an x-ray today, you will receive an invoice from [facility]. Please contact [facility] at [phone] with questions or concerns regarding your invoice.   IF you received labwork today, you will receive an invoice from [facility]. Please contact [facility] at [phone] with questions or concerns regarding your invoice.   Our billing staff will not be able to assist you with questions regarding bills from these companies.  You will be contacted with the lab results as soon as they are available. The fastest way to get your results is to activate your My Chart account. Instructions are located on the last page of this paperwork. If you have not heard from [place] regarding the results in 2 weeks, please contact this office.     Cervical Radiculopathy Cervical radiculopathy happens when a nerve in the neck (cervical nerve) is pinched or bruised. This condition can develop because of an injury or as part of the normal aging process. Pressure on the cervical nerves can cause pain or numbness that runs from the neck all the way down into the arm and fingers. Usually, this condition gets better with rest. Treatment may be needed if the condition does not improve. What are the causes? This condition may be caused by:  Injury.  Slipped (herniated) disk.  Muscle tightness in the neck because of overuse.  Arthritis.  Breakdown or degeneration in the bones and joints of the spine (spondylosis) due to aging.  Bone spurs that may develop near the cervical nerves.  What are the signs or symptoms? Symptoms of this condition include:  Pain that runs from the neck to the arm and hand. The pain can be severe or irritating. It may be worse when the neck is moved.  Numbness or weakness in the affected arm and hand.  How is this diagnosed? This condition may be diagnosed based on symptoms, medical history, and a physical exam. You may also have tests,  including:  X-rays.  CT scan.  MRI.  Electromyogram (EMG).  Nerve conduction tests.  How is this treated? In many cases, treatment is not needed for this condition. With rest, the condition usually gets better over time. If treatment is needed, options may include:  Wearing a soft neck collar for short periods of time.  Physical therapy to strengthen your neck muscles.  Medicines, such as NSAIDs, oral corticosteroids, or spinal injections.  Surgery. This may be needed if other treatments do not help. Various types of surgery may be done depending on the cause of your problems.  Follow these instructions at home: Managing pain  Take over-the-counter and prescription medicines only as told by your health care provider.  If directed, apply ice to the affected area. ? Put ice in a plastic bag. ? Place a towel between your skin and the bag. ? Leave the ice on for 20 minutes, 2-3 times per day.  If ice does not help, you can try using heat. Take a warm shower or warm bath, or use a heat pack as told by your health care provider.  Try a gentle neck and shoulder massage to help relieve symptoms. Activity  Rest as needed. Follow instructions from your health care provider about any restrictions on activities.  Do stretching and strengthening exercises as told by your health care provider or physical therapist. General instructions  If you were given a soft collar, wear it as told by your health care provider.  Use a flat pillow when you sleep.  Keep  all follow-up visits as told by your health care provider. This is important. Contact a health care provider if:  Your condition does not improve with treatment. Get help right away if:  Your pain gets much worse and cannot be controlled with medicines.  You have weakness or numbness in your hand, arm, face, or leg.  You have a high fever.  You have a stiff, rigid neck.  You lose control of your bowels or your bladder  (have incontinence).  You have trouble with walking, balance, or speaking. This information is not intended to replace advice given to you by your health care provider. Make sure you discuss any questions you have with your health care provider. Document Released: 06/12/2001 Document Revised: 02/23/2016 Document Reviewed: 11/11/2014 Elsevier Interactive Patient Education  2018 [place].  Cervical Strain and Sprain Rehab Ask your health care provider which exercises are safe for you. Do exercises exactly as told by your health care provider and adjust them as directed. It is normal to feel mild stretching, pulling, tightness, or discomfort as you do these exercises, but you should stop right away if you feel sudden pain or your pain gets worse.Do not begin these exercises until told by your health care provider. Stretching and range of motion exercises These exercises warm up your muscles and joints and improve the movement and flexibility of your neck. These exercises also help to relieve pain, numbness, and tingling. Exercise A: Cervical side bend  Using good posture, sit on a stable chair or stand up. Without moving your shoulders, slowly tilt your left / right ear to your shoulder until you feel a stretch in your neck muscles. You should be looking straight ahead. Hold for __________ seconds. Repeat with the other side of your neck. Repeat __________ times. Complete this exercise __________ times a day. Exercise B: Cervical rotation  Using good posture, sit on a stable chair or stand up. Slowly turn your head to the side as if you are looking over your left / right shoulder. Keep your eyes level with the ground. Stop when you feel a stretch along the side and the back of your neck. Hold for __________ seconds. Repeat this by turning to your other side. Repeat __________ times. Complete this exercise __________ times a day. Exercise C: Thoracic extension and pectoral stretch Roll  a towel or a small blanket so it is about 4 inches (10 cm) in diameter. Lie down on your back on a firm surface. Put the towel lengthwise, under your spine in the middle of your back. It should not be not under your shoulder blades. The towel should line up with your spine from your middle back to your lower back. Put your hands behind your head and let your elbows fall out to your sides. Hold for __________ seconds. Repeat __________ times. Complete this exercise __________ times a day.   Posture and body mechanics  Body mechanics refers to the movements and positions of your body while you do your daily activities. Posture is part of body mechanics. Good posture and healthy body mechanics can help to relieve stress in your body's tissues and joints. Good posture means that your spine is in its natural S-curve position (your spine is neutral), your shoulders are pulled back slightly, and your head is not tipped forward. The following are general guidelines for applying improved posture and body mechanics to your everyday activities. Standing When standing, keep your spine neutral and keep your feet about hip-width apart. Keep  a slight bend in your knees. Your ears, shoulders, and hips should line up. When you do a task in which you stand in one place for a long time, place one foot up on a stable object that is 2-4 inches (5-10 cm) high, such as a footstool. This helps keep your spine neutral. Sitting  When sitting, keep your spine neutral and your keep feet flat on the floor. Use a footrest, if necessary, and keep your thighs parallel to the floor. Avoid rounding your shoulders, and avoid tilting your head forward. When working at a desk or a computer, keep your desk at a height where your hands are slightly lower than your elbows. Slide your chair under your desk so you are close enough to maintain good posture. When working at a computer, place your monitor at a height where you are looking  straight ahead and you do not have to tilt your head forward or downward to look at the screen. Resting When lying down and resting, avoid positions that are most painful for you. Try to support your neck in a neutral position. You can use a contour pillow or a small rolled-up towel. Your pillow should support your neck but not push on it. This information is not intended to replace advice given to you by your health care provider. Make sure you discuss any questions you have with your health care provider. Document Released: 09/17/2005 Document Revised: 05/24/2016 Document Reviewed: 08/24/2015 Elsevier Interactive Patient Education  [employer].

## 2017-11-24 ENCOUNTER — Encounter: Payer: Self-pay | Admitting: Family Medicine

## 2017-11-24 MED ORDER — GLIPIZIDE ER 10 MG PO TB24: 20.0000 mg | ORAL_TABLET | Freq: Every day | ORAL | 1 refills | Status: DC

## 2017-12-02 ENCOUNTER — Telehealth: Payer: Self-pay

## 2017-12-02 NOTE — Telephone Encounter (Signed)
PA Started for Zolpidem Awaiting Response  [facility] is reviewing your PA request and will respond within 24-72 hours. To check for an update later, open this request from your dashboard.  **Information provided that Rozerem could not be filled due to drug interactions noticed by pharmacist.  Other Alternatives: Trazodone HCL Tabs 50 mg, 100 mg, 150 mg, 300 mg & Silenot Tabs 3mg , 6mg .

## 2017-12-06 DIAGNOSIS — M67911 Unspecified disorder of synovium and tendon, right shoulder: Secondary | ICD-10-CM | POA: Diagnosis not present

## 2017-12-09 LAB — HM DIABETES EYE EXAM

## 2017-12-10 ENCOUNTER — Other Ambulatory Visit: Payer: Self-pay | Admitting: Family Medicine

## 2017-12-11 NOTE — Telephone Encounter (Signed)
Tizanidine refill Last OV: 11/23/17 Last Refill:11/23/17 Pharmacy[facility] [place].  PCP: Delman Cheadle MD  Ondansetron ODT refill Last OV: 10/22/16 Last Refill:09/20/17 [facility] [place]. PCP: Delman Cheadle MD

## 2017-12-20 DIAGNOSIS — M4722 Other spondylosis with radiculopathy, cervical region: Secondary | ICD-10-CM | POA: Diagnosis not present

## 2017-12-20 NOTE — Telephone Encounter (Signed)
Pt came into the office today to check on her refill that was requested on 3/13. She is starting PT for her shoulder on Wednesday and feels that she will need that muscle relaxer.   Saw her Dr. Graceann Congress today and will start PT on Wednesday.  Please advise   Best callback number is [phone]

## 2017-12-20 NOTE — Telephone Encounter (Signed)
[facility]  Please inform pt and my apologies on the delay.

## 2017-12-25 DIAGNOSIS — M25511 Pain in right shoulder: Secondary | ICD-10-CM | POA: Diagnosis not present

## 2017-12-25 DIAGNOSIS — M50122 Cervical disc disorder at C5-C6 level with radiculopathy: Secondary | ICD-10-CM | POA: Diagnosis not present

## 2017-12-26 DIAGNOSIS — M25511 Pain in right shoulder: Secondary | ICD-10-CM | POA: Diagnosis not present

## 2017-12-26 DIAGNOSIS — M50122 Cervical disc disorder at C5-C6 level with radiculopathy: Secondary | ICD-10-CM | POA: Diagnosis not present

## 2017-12-29 ENCOUNTER — Telehealth: Payer: Self-pay | Admitting: Family Medicine

## 2017-12-30 ENCOUNTER — Other Ambulatory Visit: Payer: Self-pay | Admitting: Family Medicine

## 2017-12-30 NOTE — Telephone Encounter (Signed)
Copied from [place] [id number]. Topic: Quick Communication - Rx Refill/Question >> Dec 30, 2017  1:52 PM Yvette Rack wrote: Medication: butorphanol (STADOL) 10 MG/ML nasal spray Has the patient contacted their pharmacy? Yes.   (Agent: If no, request that the patient contact the pharmacy for the refill.) Preferred Pharmacy (with phone number or street name): [id number] [place], [place] [phone] (Phone) [phone] (Fax) Agent: Please be advised that RX refills may take up to 3 business days. We ask that you follow-up with your pharmacy.

## 2017-12-30 NOTE — Telephone Encounter (Signed)
Refill  Request  Stadol  Spray  Controlled  Substance   LOV  11/23/2017  Pharmacy on file

## 2017-12-31 NOTE — Telephone Encounter (Signed)
butophanol LOV: 04/12/17 PCP: [place]: [place], [place]

## 2018-01-01 ENCOUNTER — Other Ambulatory Visit: Payer: Self-pay | Admitting: *Deleted

## 2018-01-01 ENCOUNTER — Other Ambulatory Visit: Payer: Self-pay | Admitting: Family Medicine

## 2018-01-01 NOTE — Telephone Encounter (Signed)
Phenobarbital last refill 07/01/17; #135; RF x1 Last office visit 11/23/17; acute                         07/29/17; med refill appt. PCP Dr. Brigitte Pulse [facility] [place] [place]

## 2018-01-02 NOTE — Telephone Encounter (Signed)
Dr. Brigitte Pulse  Patient also states she needs her phenobarbital filled too.

## 2018-01-02 NOTE — Telephone Encounter (Signed)
Pt requesting med refill. It was denied and no one called her and she said usually she gets a call she has an OV scheduled for 01/04/18.

## 2018-01-03 DIAGNOSIS — M25511 Pain in right shoulder: Secondary | ICD-10-CM | POA: Diagnosis not present

## 2018-01-03 DIAGNOSIS — M542 Cervicalgia: Secondary | ICD-10-CM | POA: Diagnosis not present

## 2018-01-04 ENCOUNTER — Ambulatory Visit: Payer: Medicare Other | Admitting: Family Medicine

## 2018-01-04 ENCOUNTER — Other Ambulatory Visit: Payer: Self-pay | Admitting: Family Medicine

## 2018-01-04 MED ORDER — PHENOBARBITAL 97.2 MG PO TABS: ORAL_TABLET | ORAL | 1 refills | Status: DC

## 2018-01-04 MED ORDER — BUTORPHANOL TARTRATE 10 MG/ML NA SOLN: NASAL | 5 refills | Status: DC

## 2018-01-04 NOTE — Progress Notes (Signed)
Pt reports phenobarb was only 1 mo w a refill when sent on 4/2 - send in again.

## 2018-01-04 NOTE — Telephone Encounter (Signed)
Pt was just seen 6 wks ago. Ok to refill chronic meds.

## 2018-01-04 NOTE — Addendum Note (Signed)
Addended by: Shawnee Knapp on: 01/04/2018 08:24 AM   Modules accepted: Orders

## 2018-01-06 DIAGNOSIS — M67911 Unspecified disorder of synovium and tendon, right shoulder: Secondary | ICD-10-CM | POA: Diagnosis not present

## 2018-01-08 NOTE — Telephone Encounter (Signed)
Pt was called and told meds refilled sev d ago.

## 2018-02-07 ENCOUNTER — Other Ambulatory Visit: Payer: Self-pay | Admitting: Family Medicine

## 2018-02-08 ENCOUNTER — Ambulatory Visit (INDEPENDENT_AMBULATORY_CARE_PROVIDER_SITE_OTHER): Payer: Medicare Other | Admitting: Family Medicine

## 2018-02-08 ENCOUNTER — Other Ambulatory Visit: Payer: Self-pay

## 2018-02-08 ENCOUNTER — Encounter: Payer: Self-pay | Admitting: Family Medicine

## 2018-02-08 ENCOUNTER — Telehealth: Payer: Self-pay | Admitting: Family Medicine

## 2018-02-08 VITALS — BP 118/78 | HR 78 | Temp 98.3°F | Resp 16 | Ht <= 58 in | Wt 160.6 lb

## 2018-02-08 DIAGNOSIS — G43711 Chronic migraine without aura, intractable, with status migrainosus: Secondary | ICD-10-CM

## 2018-02-08 DIAGNOSIS — N183 Chronic kidney disease, stage 3 unspecified: Secondary | ICD-10-CM

## 2018-02-08 DIAGNOSIS — E782 Mixed hyperlipidemia: Secondary | ICD-10-CM | POA: Diagnosis not present

## 2018-02-08 DIAGNOSIS — G40909 Epilepsy, unspecified, not intractable, without status epilepticus: Secondary | ICD-10-CM

## 2018-02-08 DIAGNOSIS — E039 Hypothyroidism, unspecified: Secondary | ICD-10-CM

## 2018-02-08 DIAGNOSIS — N898 Other specified noninflammatory disorders of vagina: Secondary | ICD-10-CM | POA: Diagnosis not present

## 2018-02-08 DIAGNOSIS — Z5181 Encounter for therapeutic drug level monitoring: Secondary | ICD-10-CM

## 2018-02-08 DIAGNOSIS — E1169 Type 2 diabetes mellitus with other specified complication: Secondary | ICD-10-CM

## 2018-02-08 DIAGNOSIS — G4700 Insomnia, unspecified: Secondary | ICD-10-CM | POA: Diagnosis not present

## 2018-02-08 DIAGNOSIS — Z79899 Other long term (current) drug therapy: Secondary | ICD-10-CM | POA: Diagnosis not present

## 2018-02-08 LAB — POCT GLYCOSYLATED HEMOGLOBIN (HGB A1C): Hemoglobin A1C: 8.8

## 2018-02-08 MED ORDER — BUTORPHANOL TARTRATE 10 MG/ML NA SOLN: NASAL | 5 refills | Status: DC

## 2018-02-08 MED ORDER — SITAGLIPTIN PHOSPHATE 100 MG PO TABS: 100.0000 mg | ORAL_TABLET | Freq: Every day | ORAL | 1 refills | Status: DC

## 2018-02-08 MED ORDER — LEVOTHYROXINE SODIUM 75 MCG PO TABS: 75.0000 ug | ORAL_TABLET | Freq: Every day | ORAL | 1 refills | Status: DC

## 2018-02-08 MED ORDER — AMLODIPINE BESYLATE 5 MG PO TABS: 5.0000 mg | ORAL_TABLET | Freq: Every day | ORAL | 3 refills | Status: DC

## 2018-02-08 MED ORDER — GLIPIZIDE 10 MG PO TABS: 10.0000 mg | ORAL_TABLET | Freq: Two times a day (BID) | ORAL | 1 refills | Status: DC

## 2018-02-08 MED ORDER — PROMETHAZINE HCL 25 MG PO TABS: 25.0000 mg | ORAL_TABLET | Freq: Four times a day (QID) | ORAL | 5 refills | Status: DC | PRN

## 2018-02-08 MED ORDER — ESTRADIOL 0.1 MG/GM VA CREA
TOPICAL_CREAM | VAGINAL | 5 refills | Status: DC
Start: 2018-02-08 — End: 2019-09-04

## 2018-02-08 MED ORDER — LOSARTAN POTASSIUM 50 MG PO TABS: 50.0000 mg | ORAL_TABLET | Freq: Every day | ORAL | 3 refills | Status: DC

## 2018-02-08 MED ORDER — REPLENS VA GEL: 1.0000 "application " | VAGINAL | 11 refills | Status: DC

## 2018-02-08 NOTE — Telephone Encounter (Signed)
Phone call to patient. Relayed messages from provider. Discussed tier exception for medication.   Will call insurance company on Monday to discuss tier exception/medication alternatives. Patient agreeable with plan.

## 2018-02-08 NOTE — Telephone Encounter (Signed)
THere is nothing else cheaper that I am aware of or else I would have prescribed it. If her insurance is dictating therapy options, then they need to advise me what they do cover.  If they won't, apparently she is going to have to RTC to discuss starting on insulin.

## 2018-02-08 NOTE — Progress Notes (Addendum)
Subjective:  By signing my name below, I, Natalie [name], attest that this documentation has been prepared under the direction and in the presence of Delman Cheadle, MD Electronically Signed: Ladene Artist, ED Scribe 02/08/2018 at 11:10 AM.   Patient ID: Natalie [name], female    DOB: 1951-04-01, 67 y.o.   MRN: [id number]  Chief Complaint  Patient presents with  . medication management    glipizide, terazol concerns, blood work   . Medication Refill    stadol,    HPI Natalie [name] is a 67 y.o. female who presents to Primary Care at [facility] for lab work and medication management. Pt is fasting at this visit; has only had 1 cup of coffee this morning. She is using 2 bottles of migraine opioid med wkly. Started on this by Dr. Laney Pastor and her use was the same for at least several yrs. She also takes zolpidem 10 mg qhs and 1.5 tabs of phenobarbital qd.  Pt states that she has been using her migraine meds every 4 hrs. States frequency of HAs are increased by pollen and ran. She reports associated nausea which she has been treating with 2 tabs of Zofran.  DM Pt has been checking her blood glucose outside of the office with morning fasting readings of 120s-130s and mid day readings in the 250s. Pt does not want to start insulin at this time. No h/o pancreatic issues. She saw Marica Otter, OD in Feb.  Vaginal Dryness Pt has been using terconazole cream for vaginal dryness which has not been providing significant relief. She requests more effective treatment at this visit. Pt has not had a pelvic exam done in several yrs.  Past Medical History:  Diagnosis Date  . Chronic kidney disease    kidney stones, right and left  . Diabetes mellitus without complication ([place])   . Headache    migraines  . Hyperlipidemia   . Hypertension   . Iron deficiency anemia   . Renal stone   . Seizures ([place])    none x 30 yrs   Current Outpatient Medications on File Prior to Visit  Medication Sig Dispense Refill  .  amLODipine (NORVASC) 5 MG tablet TAKE 1 TABLET (5 MG TOTAL) BY MOUTH DAILY. 90 tablet 1  . atorvastatin (LIPITOR) 20 MG tablet TAKE 1 TABLET (20 MG TOTAL) BY MOUTH DAILY. 90 tablet 1  . butorphanol (STADOL) 10 MG/ML nasal spray PLACE 1 SPRAY INTO THE NOSE EVERY 4 HOURS AS NEEDED FOR MIGRAINE. 2.5 mL 5  . clobetasol cream (TEMOVATE) 0.05 % APPLY TO AFFECTED AREA ONCE EVERY DAY AS NEEDED 60 g 0  . glipiZIDE (GLUCOTROL XL) 10 MG 24 hr tablet Take 2 tablets (20 mg total) by mouth daily with breakfast. 180 tablet 1  . lactobacillus acidophilus (BACID) TABS tablet Take 2 tablets by mouth 3 (three) times daily.    Marland Kitchen levothyroxine (SYNTHROID, LEVOTHROID) 75 MCG tablet TAKE 1 TABLET (75 MCG TOTAL) BY MOUTH DAILY. 90 tablet 1  . losartan (COZAAR) 50 MG tablet Take 1 tablet (50 mg total) by mouth daily. 90 tablet 3  . ondansetron (ZOFRAN-ODT) 8 MG disintegrating tablet DISSOLVE 1 TABLET ON OR UNDER THE TONGUE EVERY 8 HOURS AS NEEDED FOR NAUSEA 20 tablet 5  . PHENobarbital (LUMINAL) 97.2 MG tablet TAKE 2 TABLETS BY MOUTH ONCE DAILY ON MON,WED,AND FRI AND 1 TABLET DAILY ON OTHER DAYS 135 tablet 1  . polycarbophil (FIBERCON) 625 MG tablet Take 625 mg by mouth daily.    Marland Kitchen  tiZANidine (ZANAFLEX) 4 MG tablet TAKE 1 TABLET BY MOUTH EVERY 8 HOURS AS NEEDED FOR MUSCLE SPASMS. AND TAKE EVERY NIGHT BEFORE BED 60 tablet 5  . vitamin B-12 1000 MCG tablet Take 1 tablet (1,000 mcg total) by mouth daily. 30 tablet 0  . zolpidem (AMBIEN) 10 MG tablet take 1 tablet by mouth at bedtime for sleep if needed. 90 tablet 1  . terconazole (TERAZOL 3) 0.8 % vaginal cream Place 1 applicator vaginally at bedtime. 3 times a week. (Patient not taking: Reported on 02/08/2018) 80 g 0   Current Facility-Administered Medications on File Prior to Visit  Medication Dose Route Frequency Provider Last Rate Last Dose  . acetaminophen (TYLENOL) tablet 1,000 mg  1,000 mg Oral Once Wendie Agreste, MD        Past Surgical History:  Procedure  Laterality Date  . CESAREAN SECTION    . CHOLECYSTECTOMY    . KIDNEY STONE SURGERY    . NEPHROLITHOTOMY Right 05/14/2016   Procedure: NEPHROLITHOTOMY PERCUTANEOUS;  Surgeon: Franchot Gallo, MD;  Location: [facility];  Service: Urology;  Laterality: Right;  . SHOULDER ARTHROSCOPY Bilateral     Allergies  Allergen Reactions  . Compazine     Bad headache, vomiting   . Cymbalta [Duloxetine Hcl]     unknown  . Methadone Hives  . Nitrofuran Derivatives     unknown  . Duloxetine Anxiety  . Escitalopram Anxiety  . Macrobid [Nitrofurantoin Monohyd Macro] Rash   Family History  Problem Relation Age of Onset  . Heart disease Father   . Diabetes Father    Social History   Socioeconomic History  . Marital status: Married    Spouse name: Not on file  . Number of children: Not on file  . Years of education: Not on file  . Highest education level: Not on file  Occupational History  . Not on file  Social Needs  . Financial resource strain: Not on file  . Food insecurity:    Worry: Not on file    Inability: Not on file  . Transportation needs:    Medical: Not on file    Non-medical: Not on file  Tobacco Use  . Smoking status: Never Smoker  . Smokeless tobacco: Never Used  Substance and Sexual Activity  . Alcohol use: No    Alcohol/week: 0.0 oz  . Drug use: No  . Sexual activity: Not on file  Lifestyle  . Physical activity:    Days per week: Not on file    Minutes per session: Not on file  . Stress: Not on file  Relationships  . Social connections:    Talks on phone: Not on file    Gets together: Not on file    Attends religious service: Not on file    Active member of club or organization: Not on file    Attends meetings of clubs or organizations: Not on file    Relationship status: Not on file  Other Topics Concern  . Not on file  Social History Narrative  . Not on file   Depression screen [facility] 2/9 02/08/2018 11/23/2017 07/29/2017 04/12/2017 12/27/2016  Decreased  Interest 0 0 0 0 0  Down, Depressed, Hopeless 0 0 0 0 0  PHQ - 2 Score 0 0 0 0 0     Review of Systems  Gastrointestinal: Positive for nausea.  Genitourinary:       + Vaginal dryness  Neurological: Positive for headaches.     Objective:  Physical Exam  Constitutional: She is oriented to person, place, and time. She appears well-developed and well-nourished. No distress.  HENT:  Head: Normocephalic and atraumatic.  Eyes: Conjunctivae and EOM are normal.  Neck: Neck supple. No tracheal deviation present.  Cardiovascular: Normal rate and regular rhythm.  Pulmonary/Chest: Effort normal and breath sounds normal. No respiratory distress.  Musculoskeletal: Normal range of motion.  Neurological: She is alert and oriented to person, place, and time.  Skin: Skin is warm and dry.  Psychiatric: She has a normal mood and affect. Her behavior is normal.  Nursing note and vitals reviewed.  BP 118/78   Pulse 78   Temp 98.3 F (36.8 C)   Resp 16   Ht 4\' 8"  (1.422 m)   Wt 160 lb 9.6 oz (72.8 kg)   SpO2 99%   BMI 36.01 kg/m    Results for orders placed or performed in visit on 02/08/18  POCT glycosylated hemoglobin (Hb A1C)  Result Value Ref Range   Hemoglobin A1C 8.8       Assessment & Plan:  Refill atorvastatin after labs come back.  1. Type 2 diabetes mellitus with other specified complication, without long-term current use of insulin ([employer])   2. Mixed hyperlipidemia   3. Acquired hypothyroidism   4. Migraine, chronic, without aura, intractable, with status migrainosus   5. Seizure disorder ([place])   6. Stage 3 chronic kidney disease ([place])   7. Insomnia, unspecified type   8. Encounter for long-term (current) use of high-risk medication   9. Medication monitoring encounter   10. Vaginal dryness - Needs pelvic exam to ensure only postmenopausal vaginal atrophy as etiology - no other abnml - for now try vaginal moisturizer w/ prn lubricant. If pelvic nml, could try low dose  vaginal estrogen.    Orders Placed This Encounter  Procedures  . Comprehensive metabolic panel    Order Specific Question:   Has the patient fasted?    Answer:   Yes  . Lipid panel    Order Specific Question:   Has the patient fasted?    Answer:   Yes  . TSH  . ToxASSURE Select 13 (MW), Urine  . POCT glycosylated hemoglobin (Hb A1C)    Meds ordered this encounter  Medications  . estradiol (ESTRACE) 0.1 MG/GM vaginal cream    Sig: Use 1 fingertip amount to urethra, clitoris, and vaginal introitus every night x 2 weeks, then decreased to 2-3x weekly    Dispense:  42.5 g    Refill:  5  . glipiZIDE (GLUCOTROL) 10 MG tablet    Sig: Take 1 tablet (10 mg total) by mouth 2 (two) times daily before a meal.    Dispense:  180 tablet    Refill:  1  . sitaGLIPtin (JANUVIA) 100 MG tablet    Sig: Take 1 tablet (100 mg total) by mouth daily.    Dispense:  90 tablet    Refill:  1  . promethazine (PHENERGAN) 25 MG tablet    Sig: Take 1 tablet (25 mg total) by mouth every 6 (six) hours as needed for nausea or vomiting.    Dispense:  20 tablet    Refill:  5  . losartan (COZAAR) 50 MG tablet    Sig: Take 1 tablet (50 mg total) by mouth daily.    Dispense:  90 tablet    Refill:  3  . amLODipine (NORVASC) 5 MG tablet    Sig: Take 1 tablet (5 mg total) by mouth  daily.    Dispense:  90 tablet    Refill:  3  . butorphanol (STADOL) 10 MG/ML nasal spray    Sig: PLACE 1 SPRAY INTO THE NOSE EVERY 4 HOURS AS NEEDED FOR MIGRAINE.    Dispense:  2.5 mL    Refill:  5  . levothyroxine (SYNTHROID, LEVOTHROID) 75 MCG tablet    Sig: Take 1 tablet (75 mcg total) by mouth daily.    Dispense:  90 tablet    Refill:  1  . Vaginal Lubricant (REPLENS) GEL    Sig: Place 1 application vaginally 3 (three) times a week.    Dispense:  35 g    Refill:  11    I personally performed the services described in this documentation, which was scribed in my presence. The recorded information has been reviewed and  considered, and addended by me as needed.   Delman Cheadle, M.D.  Primary Care at [facility] [address] [place], [place] [address] [phone] phone [phone] fax  03/03/18 1:38 PM

## 2018-02-08 NOTE — Telephone Encounter (Signed)
Discussed Januvia prescription with patient while patient in clinic. She states it is too expensive, medication will cost her around 400 dollars to fill.   Incoming call from [facility], spoke with Vicente Males.   She states transmission error noted - wanted to confirm prescriptions. All prescriptions from visit were sent to pharmacy.   Regarding Celesta Gentile, Vicente Males states there is no generic to dispense to patient. Provider, please advise.

## 2018-02-08 NOTE — Telephone Encounter (Signed)
Since she is straight medicare, is she covered under [employer]?  Maybe the [facility] pharmacist can help her apply to the pharmaceutical companies indigent care/medicaiton assistance programs like AZ&Me, etc

## 2018-02-08 NOTE — Patient Instructions (Addendum)
We need to do a pelvic exam at your next visit - please sched an appointment for this.  Vaginal moisturizers are intended for use routinely, typically two or three days per week, not just during sexual activity. Many moisturizer products are available in pharmacies and online (eg, Replens, Vagisil Moisturizer, Feminease, Moist Again, K-Y Liquibeads).   Lubricants are used only at the time of sexual activity. Good water based lubricants include Astroglide, Slippery Stuff, and K-Y Jelly.   IF you received an x-ray today, you will receive an invoice from [facility]. Please contact [facility] at [phone] with questions or concerns regarding your invoice.   IF you received labwork today, you will receive an invoice from [place]. Please contact [facility] at [phone] with questions or concerns regarding your invoice.   Our billing staff will not be able to assist you with questions regarding bills from these companies.  You will be contacted with the lab results as soon as they are available. The fastest way to get your results is to activate your My Chart account. Instructions are located on the last page of this paperwork. If you have not heard from [place] regarding the results in 2 weeks, please contact this office.     Atrophic Vaginitis Atrophic vaginitis is a condition in which the tissues that line the vagina become dry and thin. This condition is most common in women who have stopped having regular menstrual periods (menopause). This usually starts when a woman is 40-67 years old. Estrogen helps to keep the vagina moist. It stimulates the vagina to produce a clear fluid that lubricates the vagina for sexual intercourse. This fluid also protects the vagina from infection. Lack of estrogen can cause the lining of the vagina to get thinner and dryer. The vagina may also shrink in size. It may become less elastic. Atrophic vaginitis tends to get worse over time as a woman's  estrogen level drops. What are the causes? This condition is caused by the normal drop in estrogen that happens around the time of menopause. What increases the risk? Certain conditions or situations may lower a woman's estrogen level, which increases her risk of atrophic vaginitis. These include:  Taking medicine that blocks estrogen.  Having ovaries removed surgically.  Being treated for cancer with X-ray treatment (radiation) or medicines (chemotherapy).  Exercising very hard and often.  Having an eating disorder (anorexia).  Giving birth or breastfeeding.  Being over the age of 60.  Smoking.  What are the signs or symptoms? Symptoms of this condition include:  Pain, soreness, or bleeding during sexual intercourse (dyspareunia).  Vaginal burning, irritation, or itching.  Pain or bleeding during a vaginal examination using a speculum (pelvic exam).  Loss of interest in sexual activity.  Having burning pain when passing urine.  Vaginal discharge that is brown or yellow.  In some cases, there are no symptoms. How is this diagnosed? This condition is diagnosed with a medical history and physical exam. This will include a pelvic exam that checks whether the inside of your vagina appears pale, thin, or dry. Rarely, you may also have other tests, including:  A urine test.  A test that checks the acid balance in your vaginal fluid (acid balance test).  How is this treated? Treatment for this condition may depend on the severity of your symptoms. Treatment may include:  Using an over-the-counter vaginal lubricant before you have sexual intercourse.  Using a long-acting vaginal moisturizer.  Using low-dose vaginal estrogen for moderate to severe  symptoms that do not respond to other treatments. Options include creams, tablets, and inserts (vaginal rings). Before using vaginal estrogen, tell your health care provider if you have a history of: ? Breast  cancer. ? Endometrial cancer. ? Blood clots.  Taking medicines. You may be able to take a daily pill for dyspareunia. Discuss all of the risks of this medicine with your health care provider. It is usually not recommended for women who have a family history or personal history of breast cancer.  If your symptoms are very mild and you are not sexually active, you may not need treatment. Follow these instructions at home:  Take medicines only as directed by your health care provider. Do not use herbal or alternative medicines unless your health care provider says that you can.  Use over-the-counter creams, lubricants, or moisturizers for dryness only as directed by your health care provider.  If your atrophic vaginitis is caused by menopause, discuss all of your menopausal symptoms and treatment options with your health care provider.  Do not douche.  Do not use products that can make your vagina dry. These include: ? Scented feminine sprays. ? Scented tampons. ? Scented soaps.  If it hurts to have sex, talk with your sexual partner. Contact a health care provider if:  Your discharge looks different than normal.  Your vagina has an unusual smell.  You have new symptoms.  Your symptoms do not improve with treatment.  Your symptoms get worse. This information is not intended to replace advice given to you by your health care provider. Make sure you discuss any questions you have with your health care provider. Document Released: 02/01/2015 Document Revised: 02/23/2016 Document Reviewed: 09/08/2014 Elsevier Interactive Patient Education  [employer].

## 2018-02-09 LAB — LIPID PANEL
CHOLESTEROL TOTAL: 181 mg/dL (ref 100–199)
Chol/HDL Ratio: 3.2 ratio (ref 0.0–4.4)
HDL: 57 mg/dL (ref >39)
LDL Calculated: 93 mg/dL (ref 0–99)
TRIGLYCERIDES: 156 mg/dL — AB (ref 0–149)
VLDL Cholesterol Cal: 31 mg/dL (ref 5–40)

## 2018-02-09 LAB — COMPREHENSIVE METABOLIC PANEL
A/G RATIO: 1.8 (ref 1.2–2.2)
ALT: 28 IU/L (ref 0–32)
AST: 28 IU/L (ref 0–40)
Albumin: 4.8 g/dL (ref 3.6–4.8)
Alkaline Phosphatase: 82 IU/L (ref 39–117)
BUN/Creatinine Ratio: 20 (ref 12–28)
BUN: 26 mg/dL (ref 8–27)
CHLORIDE: 101 mmol/L (ref 96–106)
CO2: 20 mmol/L (ref 20–29)
Calcium: 10.3 mg/dL (ref 8.7–10.3)
Creatinine, Ser: 1.27 mg/dL — ABNORMAL HIGH (ref 0.57–1.00)
GFR calc Af Amer: 50 mL/min/{1.73_m2} — ABNORMAL LOW (ref >59)
GFR calc non Af Amer: 43 mL/min/{1.73_m2} — ABNORMAL LOW (ref >59)
GLUCOSE: 185 mg/dL — AB (ref 65–99)
Globulin, Total: 2.6 g/dL (ref 1.5–4.5)
POTASSIUM: 4.2 mmol/L (ref 3.5–5.2)
Sodium: 141 mmol/L (ref 134–144)
Total Protein: 7.4 g/dL (ref 6.0–8.5)

## 2018-02-09 LAB — TSH: TSH: 2 u[IU]/mL (ref 0.450–4.500)

## 2018-02-10 DIAGNOSIS — M67911 Unspecified disorder of synovium and tendon, right shoulder: Secondary | ICD-10-CM | POA: Diagnosis not present

## 2018-02-14 ENCOUNTER — Telehealth: Payer: Self-pay | Admitting: Family Medicine

## 2018-02-14 LAB — TOXASSURE SELECT 13 (MW), URINE

## 2018-02-14 NOTE — Telephone Encounter (Signed)
Copied from [place] [id number]. Topic: Quick Communication - See Telephone Encounter >> Feb 14, 2018 12:53 PM Hewitt Shorts wrote: Pt is having surgery of Tuesday 21st on her shoulder and she will need to get her pain meds from Dr. Glendon Axe not her [place] number [phone]

## 2018-02-15 NOTE — Telephone Encounter (Signed)
Message sent to Dr. Brigitte Pulse re: pain meds following surgery

## 2018-02-18 DIAGNOSIS — M75111 Incomplete rotator cuff tear or rupture of right shoulder, not specified as traumatic: Secondary | ICD-10-CM | POA: Diagnosis not present

## 2018-02-18 DIAGNOSIS — M24111 Other articular cartilage disorders, right shoulder: Secondary | ICD-10-CM | POA: Diagnosis not present

## 2018-02-18 DIAGNOSIS — M7541 Impingement syndrome of right shoulder: Secondary | ICD-10-CM | POA: Diagnosis not present

## 2018-02-18 DIAGNOSIS — G8918 Other acute postprocedural pain: Secondary | ICD-10-CM | POA: Diagnosis not present

## 2018-02-18 DIAGNOSIS — M7551 Bursitis of right shoulder: Secondary | ICD-10-CM | POA: Diagnosis not present

## 2018-02-18 DIAGNOSIS — M19011 Primary osteoarthritis, right shoulder: Secondary | ICD-10-CM | POA: Diagnosis not present

## 2018-02-19 NOTE — Telephone Encounter (Signed)
When she is having a planned procedure - e.g. Not done emergently for a ruptured gallbladder or fractured hip - she needs to come in to see me in a visit about this so we can discuss what she has done well on in the past, how much it takes to control her pain, what we anticipate the severity of her pain to be and for how long - might that change as she start PT, etc.  And then we will together come up with a safe plan for pain control after her surgery and through recovery taking into account her current high risk medications.  But it appears she had her surgery yesterday?  So did she receive meds from her surgeon then?

## 2018-02-20 NOTE — Telephone Encounter (Signed)
Please schedule pt

## 2018-02-25 ENCOUNTER — Encounter: Payer: Self-pay | Admitting: Family Medicine

## 2018-02-25 NOTE — Telephone Encounter (Signed)
I spoke with [name]. She is wanting to follow up with her surgeon first and then make an appointment with Dr. Brigitte Pulse. She states that the muscle relaxers that Dr. Brigitte Pulse prescribed a little while back is what is helping her and she says she still has a couple more refills left of them.

## 2018-02-28 DIAGNOSIS — M19011 Primary osteoarthritis, right shoulder: Secondary | ICD-10-CM | POA: Diagnosis not present

## 2018-03-03 ENCOUNTER — Other Ambulatory Visit: Payer: Self-pay

## 2018-03-03 ENCOUNTER — Encounter: Payer: Self-pay | Admitting: Family Medicine

## 2018-03-03 ENCOUNTER — Ambulatory Visit (INDEPENDENT_AMBULATORY_CARE_PROVIDER_SITE_OTHER): Payer: Medicare Other | Admitting: Family Medicine

## 2018-03-03 VITALS — BP 120/80 | HR 77 | Temp 98.3°F | Resp 16 | Ht <= 58 in | Wt 155.6 lb

## 2018-03-03 DIAGNOSIS — M542 Cervicalgia: Secondary | ICD-10-CM

## 2018-03-03 DIAGNOSIS — Z5181 Encounter for therapeutic drug level monitoring: Secondary | ICD-10-CM

## 2018-03-03 DIAGNOSIS — E782 Mixed hyperlipidemia: Secondary | ICD-10-CM | POA: Diagnosis not present

## 2018-03-03 DIAGNOSIS — R202 Paresthesia of skin: Secondary | ICD-10-CM

## 2018-03-03 DIAGNOSIS — G894 Chronic pain syndrome: Secondary | ICD-10-CM

## 2018-03-03 DIAGNOSIS — G43711 Chronic migraine without aura, intractable, with status migrainosus: Secondary | ICD-10-CM

## 2018-03-03 DIAGNOSIS — E039 Hypothyroidism, unspecified: Secondary | ICD-10-CM | POA: Diagnosis not present

## 2018-03-03 DIAGNOSIS — Z79899 Other long term (current) drug therapy: Secondary | ICD-10-CM | POA: Diagnosis not present

## 2018-03-03 DIAGNOSIS — G8918 Other acute postprocedural pain: Secondary | ICD-10-CM

## 2018-03-03 DIAGNOSIS — G4700 Insomnia, unspecified: Secondary | ICD-10-CM

## 2018-03-03 DIAGNOSIS — M25511 Pain in right shoulder: Secondary | ICD-10-CM

## 2018-03-03 DIAGNOSIS — R200 Anesthesia of skin: Secondary | ICD-10-CM | POA: Diagnosis not present

## 2018-03-03 MED ORDER — ATORVASTATIN CALCIUM 20 MG PO TABS: 20.0000 mg | ORAL_TABLET | Freq: Every day | ORAL | 3 refills | Status: DC

## 2018-03-03 MED ORDER — TIZANIDINE HCL 4 MG PO TABS: ORAL_TABLET | ORAL | 1 refills | Status: DC

## 2018-03-03 MED ORDER — LEVOTHYROXINE SODIUM 75 MCG PO TABS: 75.0000 ug | ORAL_TABLET | Freq: Every day | ORAL | 1 refills | Status: DC

## 2018-03-03 NOTE — Patient Instructions (Signed)
     IF you received an x-ray today, you will receive an invoice from [facility]. Please contact [facility] at [phone] with questions or concerns regarding your invoice.   IF you received labwork today, you will receive an invoice from [facility]. Please contact [facility] at [phone] with questions or concerns regarding your invoice.   Our billing staff will not be able to assist you with questions regarding bills from these companies.  You will be contacted with the lab results as soon as they are available. The fastest way to get your results is to activate your My Chart account. Instructions are located on the last page of this paperwork. If you have not heard from us regarding the results in 2 weeks, please contact this office.     

## 2018-03-03 NOTE — Progress Notes (Signed)
Subjective:  By signing my name below, I, [name], attest that this documentation has been prepared under the direction and in the presence of Delman Cheadle, MD Electronically Signed: Ladene Artist, ED Scribe 03/03/2018 at 1:58 PM.   Patient ID: Natalie [name], female    DOB: 07/03/1951, 67 y.o.   MRN: [id number]  Chief Complaint  Patient presents with  . Follow up    right shoulder surgery, stitches were removed on Friday x 4 days ago   HPI Natalie [name] is a 67 y.o. female who presents to Primary Care at [facility] for f/u on R shoulder surgery. Pt states that she had surgery on 5/21, stitches removed 4 days ago. She states that she still has some soreness with lifting her shoulder. Pt was initially given Percocet which she states does not work for her and Baclofen which couldn't be filled. She has been taking Zanaflex and Stadol, which are her chronic meds for her severe daily HA syndrome but she states she was using more frequently right after surgery.  Past Medical History:  Diagnosis Date  . Chronic kidney disease    kidney stones, right and left  . Diabetes mellitus without complication ([place])   . Headache    migraines  . Hyperlipidemia   . Hypertension   . Iron deficiency anemia   . Renal stone   . Seizures ([place])    none x 30 yrs   Current Outpatient Medications on File Prior to Visit  Medication Sig Dispense Refill  . amLODipine (NORVASC) 5 MG tablet Take 1 tablet (5 mg total) by mouth daily. 90 tablet 3  . atorvastatin (LIPITOR) 20 MG tablet TAKE 1 TABLET (20 MG TOTAL) BY MOUTH DAILY. 90 tablet 1  . butorphanol (STADOL) 10 MG/ML nasal spray PLACE 1 SPRAY INTO THE NOSE EVERY 4 HOURS AS NEEDED FOR MIGRAINE. 2.5 mL 5  . clobetasol cream (TEMOVATE) 0.05 % APPLY TO AFFECTED AREA ONCE EVERY DAY AS NEEDED 60 g 0  . estradiol (ESTRACE) 0.1 MG/GM vaginal cream Use 1 fingertip amount to urethra, clitoris, and vaginal introitus every night x 2 weeks, then decreased to 2-3x weekly 42.5 g 5    . glipiZIDE (GLUCOTROL) 10 MG tablet Take 1 tablet (10 mg total) by mouth 2 (two) times daily before a meal. 180 tablet 1  . lactobacillus acidophilus (BACID) TABS tablet Take 2 tablets by mouth 3 (three) times daily.    Marland Kitchen levothyroxine (SYNTHROID, LEVOTHROID) 75 MCG tablet Take 1 tablet (75 mcg total) by mouth daily. 90 tablet 1  . losartan (COZAAR) 50 MG tablet Take 1 tablet (50 mg total) by mouth daily. 90 tablet 3  . PHENobarbital (LUMINAL) 97.2 MG tablet TAKE 2 TABLETS BY MOUTH ONCE DAILY ON MON,WED,AND FRI AND 1 TABLET DAILY ON OTHER DAYS 135 tablet 1  . polycarbophil (FIBERCON) 625 MG tablet Take 625 mg by mouth daily.    Marland Kitchen tiZANidine (ZANAFLEX) 4 MG tablet TAKE 1 TABLET BY MOUTH EVERY 8 HOURS AS NEEDED FOR MUSCLE SPASMS. AND TAKE EVERY NIGHT BEFORE BED 60 tablet 5  . Vaginal Lubricant (REPLENS) GEL Place 1 application vaginally 3 (three) times a week. 35 g 11  . vitamin B-12 1000 MCG tablet Take 1 tablet (1,000 mcg total) by mouth daily. 30 tablet 0  . zolpidem (AMBIEN) 10 MG tablet take 1 tablet by mouth at bedtime for sleep if needed. 90 tablet 1  . promethazine (PHENERGAN) 25 MG tablet Take 1 tablet (25 mg total) by mouth every  6 (six) hours as needed for nausea or vomiting. (Patient not taking: Reported on 03/03/2018) 20 tablet 5  . sitaGLIPtin (JANUVIA) 100 MG tablet Take 1 tablet (100 mg total) by mouth daily. (Patient not taking: Reported on 03/03/2018) 90 tablet 1   Current Facility-Administered Medications on File Prior to Visit  Medication Dose Route Frequency Provider Last Rate Last Dose  . acetaminophen (TYLENOL) tablet 1,000 mg  1,000 mg Oral Once Wendie Agreste, MD       Past Surgical History:  Procedure Laterality Date  . CESAREAN SECTION    . CHOLECYSTECTOMY    . KIDNEY STONE SURGERY    . NEPHROLITHOTOMY Right 05/14/2016   Procedure: NEPHROLITHOTOMY PERCUTANEOUS;  Surgeon: Franchot Gallo, MD;  Location: [facility];  Service: Urology;  Laterality: Right;  . SHOULDER  ARTHROSCOPY Bilateral    Allergies  Allergen Reactions  . Compazine Other (See Comments)    Bad headache, vomiting   . Cymbalta [Duloxetine Hcl] Other (See Comments)    "Kept me awake"  . Methadone Hives  . Duloxetine Anxiety  . Escitalopram Anxiety  . Macrobid [Nitrofurantoin Monohyd Macro] Rash  . Nitrofuran Derivatives Rash   Family History  Problem Relation Age of Onset  . Heart disease Father   . Diabetes Father    Social History   Socioeconomic History  . Marital status: Married    Spouse name: Not on file  . Number of children: Not on file  . Years of education: Not on file  . Highest education level: Not on file  Occupational History  . Not on file  Social Needs  . Financial resource strain: Not on file  . Food insecurity:    Worry: Not on file    Inability: Not on file  . Transportation needs:    Medical: Not on file    Non-medical: Not on file  Tobacco Use  . Smoking status: Never Smoker  . Smokeless tobacco: Never Used  Substance and Sexual Activity  . Alcohol use: No    Alcohol/week: 0.0 oz  . Drug use: No  . Sexual activity: Not on file  Lifestyle  . Physical activity:    Days per week: Not on file    Minutes per session: Not on file  . Stress: Not on file  Relationships  . Social connections:    Talks on phone: Not on file    Gets together: Not on file    Attends religious service: Not on file    Active member of club or organization: Not on file    Attends meetings of clubs or organizations: Not on file    Relationship status: Not on file  Other Topics Concern  . Not on file  Social History Narrative  . Not on file   Depression screen [facility] 2/9 03/03/2018 02/08/2018 11/23/2017 07/29/2017 04/12/2017  Decreased Interest 0 0 0 0 0  Down, Depressed, Hopeless 0 0 0 0 0  PHQ - 2 Score 0 0 0 0 0     Review of Systems  Musculoskeletal: Positive for arthralgias.      Objective:   Physical Exam  Constitutional: She is oriented to person,  place, and time. She appears well-developed and well-nourished. No distress.  HENT:  Head: Normocephalic and atraumatic.  Eyes: Conjunctivae and EOM are normal.  Neck: Neck supple. No tracheal deviation present.  Cardiovascular: Normal rate.  Pulmonary/Chest: Effort normal. No respiratory distress.  Musculoskeletal: Normal range of motion.  Neurological: She is alert and oriented to  person, place, and time.  Skin: Skin is warm and dry.  Psychiatric: She has a normal mood and affect. Her behavior is normal.  Nursing note and vitals reviewed.  BP 120/80 (BP Location: Left Arm, Patient Position: Sitting, Cuff Size: Normal)   Pulse 77   Temp 98.3 F (36.8 C) (Oral)   Resp 16   Ht 4\' 8"  (1.422 m)   Wt 155 lb 9.6 oz (70.6 kg)   SpO2 97%   BMI 34.88 kg/m     Assessment & Plan:   1. Migraine, chronic, without aura, intractable, with status migrainosus   2. Chronic pain syndrome - on stadol 25mg  qwk intranasal sprays to treat migraine pain - because of stadol, she does not get any sig relief with other narcotics UDS 02/08/18 appropriate  3. Acute pain of right shoulder - pain difficult to control as baclofen and oxycodone ineffective - admits use of stadol and tizanidine have increased -   4. Post-operative pain - from shoulder - beginning to heal and pain already much improved.  5. Cervicalgia - was using tizanidine 4mg  bid (#60/mo) but as increasing opioids not an options for pain control and pt gets sig relief - will try increasing zanaflex 4mg  dose to qidprn from bid.  6. Numbness and tingling of right upper extremity   7. Medication monitoring encounter   8. Encounter for long-term (current) use of high-risk medication - on stadol - using 25mg  intranasally qwk, zolpidem 10 qhs, tizanidine 4mg  q8hrs prn and qhs, phenobarbital,   9. Insomnia, unspecified type - cont zolpidem 10 qhs - on long-term.  10.    Hypothyroidism, unspecified type - tsh nml - cont levothyroxine 75 - dose stable w/  tsh wnml for > 5 yrs 11.    Mixed hyperlipidemia - LDL at goal at 93 and non-HDL 124- cont atorvastatin 20 qhs   Meds ordered this encounter  Medications  . tiZANidine (ZANAFLEX) 4 MG tablet    Sig: TAKE 1 TABLET BY MOUTH EVERY 6 hrs prn MUSCLE SPASMS. AND TAKE EVERY NIGHT BEFORE BED    Dispense:  120 tablet    Refill:  1    D/c prior rx for tizanidine #60/mo  . atorvastatin (LIPITOR) 20 MG tablet    Sig: Take 1 tablet (20 mg total) by mouth daily.    Dispense:  90 tablet    Refill:  3  . levothyroxine (SYNTHROID, LEVOTHROID) 75 MCG tablet    Sig: Take 1 tablet (75 mcg total) by mouth daily.    Dispense:  90 tablet    Refill:  1    I personally performed the services described in this documentation, which was scribed in my presence. The recorded information has been reviewed and considered, and addended by me as needed.   Delman Cheadle, M.D.  Primary Care at [facility] [address] [place], [place] [address] [phone] phone [phone] fax  03/16/18 2:00 AM

## 2018-03-13 ENCOUNTER — Inpatient Hospital Stay (HOSPITAL_COMMUNITY)
Admission: EM | Admit: 2018-03-13 | Discharge: 2018-03-15 | DRG: 690 | Disposition: A | Discharge status: Home / Self Care | Payer: Medicare Other | Attending: Family Medicine | Admitting: Family Medicine

## 2018-03-13 ENCOUNTER — Emergency Department (HOSPITAL_COMMUNITY): Payer: Medicare Other

## 2018-03-13 ENCOUNTER — Other Ambulatory Visit: Payer: Self-pay

## 2018-03-13 DIAGNOSIS — N183 Chronic kidney disease, stage 3 (moderate): Secondary | ICD-10-CM | POA: Diagnosis not present

## 2018-03-13 DIAGNOSIS — Z794 Long term (current) use of insulin: Secondary | ICD-10-CM

## 2018-03-13 DIAGNOSIS — N12 Tubulo-interstitial nephritis, not specified as acute or chronic: Secondary | ICD-10-CM | POA: Diagnosis present

## 2018-03-13 DIAGNOSIS — G40909 Epilepsy, unspecified, not intractable, without status epilepticus: Secondary | ICD-10-CM | POA: Diagnosis present

## 2018-03-13 DIAGNOSIS — E785 Hyperlipidemia, unspecified: Secondary | ICD-10-CM | POA: Diagnosis present

## 2018-03-13 DIAGNOSIS — I959 Hypotension, unspecified: Secondary | ICD-10-CM | POA: Diagnosis present

## 2018-03-13 DIAGNOSIS — I129 Hypertensive chronic kidney disease with stage 1 through stage 4 chronic kidney disease, or unspecified chronic kidney disease: Secondary | ICD-10-CM | POA: Diagnosis not present

## 2018-03-13 DIAGNOSIS — N132 Hydronephrosis with renal and ureteral calculous obstruction: Secondary | ICD-10-CM | POA: Diagnosis not present

## 2018-03-13 DIAGNOSIS — E872 Acidosis: Secondary | ICD-10-CM | POA: Diagnosis not present

## 2018-03-13 DIAGNOSIS — I10 Essential (primary) hypertension: Secondary | ICD-10-CM | POA: Diagnosis present

## 2018-03-13 DIAGNOSIS — E039 Hypothyroidism, unspecified: Secondary | ICD-10-CM | POA: Diagnosis present

## 2018-03-13 DIAGNOSIS — Z888 Allergy status to other drugs, medicaments and biological substances status: Secondary | ICD-10-CM

## 2018-03-13 DIAGNOSIS — E1129 Type 2 diabetes mellitus with other diabetic kidney complication: Secondary | ICD-10-CM

## 2018-03-13 DIAGNOSIS — R531 Weakness: Secondary | ICD-10-CM | POA: Diagnosis not present

## 2018-03-13 DIAGNOSIS — N179 Acute kidney failure, unspecified: Secondary | ICD-10-CM | POA: Diagnosis present

## 2018-03-13 DIAGNOSIS — N390 Urinary tract infection, site not specified: Secondary | ICD-10-CM | POA: Diagnosis present

## 2018-03-13 DIAGNOSIS — E1122 Type 2 diabetes mellitus with diabetic chronic kidney disease: Secondary | ICD-10-CM | POA: Diagnosis present

## 2018-03-13 DIAGNOSIS — E860 Dehydration: Secondary | ICD-10-CM | POA: Diagnosis present

## 2018-03-13 DIAGNOSIS — Z885 Allergy status to narcotic agent status: Secondary | ICD-10-CM

## 2018-03-13 DIAGNOSIS — N202 Calculus of kidney with calculus of ureter: Secondary | ICD-10-CM | POA: Diagnosis not present

## 2018-03-13 DIAGNOSIS — E119 Type 2 diabetes mellitus without complications: Secondary | ICD-10-CM | POA: Diagnosis present

## 2018-03-13 LAB — CBC WITH DIFFERENTIAL/PLATELET
ABS IMMATURE GRANULOCYTES: 0 10*3/uL (ref 0.0–0.1)
BASOS ABS: 0 10*3/uL (ref 0.0–0.1)
BASOS PCT: 1 %
Eosinophils Absolute: 0.2 10*3/uL (ref 0.0–0.7)
Eosinophils Relative: 3 %
HCT: 36.8 % (ref 36.0–46.0)
HEMOGLOBIN: 11.3 g/dL — AB (ref 12.0–15.0)
IMMATURE GRANULOCYTES: 0 %
Lymphocytes Relative: 30 %
Lymphs Abs: 2.1 10*3/uL (ref 0.7–4.0)
MCH: 31 pg (ref 26.0–34.0)
MCHC: 30.7 g/dL (ref 30.0–36.0)
MCV: 100.8 fL — ABNORMAL HIGH (ref 78.0–100.0)
MONOS PCT: 12 %
Monocytes Absolute: 0.8 10*3/uL (ref 0.1–1.0)
NEUTROS ABS: 4 10*3/uL (ref 1.7–7.7)
NEUTROS PCT: 55 %
PLATELETS: 294 10*3/uL (ref 150–400)
RBC: 3.65 MIL/uL — ABNORMAL LOW (ref 3.87–5.11)
RDW: 12.9 % (ref 11.5–15.5)
WBC: 7.2 10*3/uL (ref 4.0–10.5)

## 2018-03-13 LAB — COMPREHENSIVE METABOLIC PANEL
ALBUMIN: 3.7 g/dL (ref 3.5–5.0)
ALK PHOS: 69 U/L (ref 38–126)
ALT: 45 U/L (ref 14–54)
ANION GAP: 11 (ref 5–15)
AST: 35 U/L (ref 15–41)
BUN: 27 mg/dL — ABNORMAL HIGH (ref 6–20)
CALCIUM: 9.4 mg/dL (ref 8.9–10.3)
CO2: 24 mmol/L (ref 22–32)
Chloride: 104 mmol/L (ref 101–111)
Creatinine, Ser: 1.76 mg/dL — ABNORMAL HIGH (ref 0.44–1.00)
GFR calc Af Amer: 33 mL/min — ABNORMAL LOW (ref >60)
GFR calc non Af Amer: 28 mL/min — ABNORMAL LOW (ref >60)
GLUCOSE: 187 mg/dL — AB (ref 65–99)
POTASSIUM: 4.9 mmol/L (ref 3.5–5.1)
SODIUM: 139 mmol/L (ref 135–145)
TOTAL PROTEIN: 6.5 g/dL (ref 6.5–8.1)
Total Bilirubin: 0.4 mg/dL (ref 0.3–1.2)

## 2018-03-13 LAB — ABO/RH: ABO/RH(D): A POS

## 2018-03-13 LAB — I-STAT CHEM 8, ED
BUN: 28 mg/dL — ABNORMAL HIGH (ref 6–20)
CALCIUM ION: 1.21 mmol/L (ref 1.15–1.40)
CREATININE: 1.5 mg/dL — AB (ref 0.44–1.00)
Chloride: 109 mmol/L (ref 101–111)
Glucose, Bld: 167 mg/dL — ABNORMAL HIGH (ref 65–99)
HCT: 31 % — ABNORMAL LOW (ref 36.0–46.0)
HEMOGLOBIN: 10.5 g/dL — AB (ref 12.0–15.0)
Potassium: 5 mmol/L (ref 3.5–5.1)
Sodium: 139 mmol/L (ref 135–145)
TCO2: 21 mmol/L — AB (ref 22–32)

## 2018-03-13 LAB — TYPE AND SCREEN
ABO/RH(D): A POS
Antibody Screen: NEGATIVE

## 2018-03-13 LAB — I-STAT CG4 LACTIC ACID, ED: LACTIC ACID, VENOUS: 2.97 mmol/L — AB (ref 0.5–1.9)

## 2018-03-13 LAB — I-STAT TROPONIN, ED: TROPONIN I, POC: 0 ng/mL (ref 0.00–0.08)

## 2018-03-13 MED ORDER — SODIUM CHLORIDE 0.9 % IV BOLUS
1000.0000 mL | Freq: Once | INTRAVENOUS | Status: AC
  Administered 2018-03-14: 1000 mL via INTRAVENOUS

## 2018-03-13 MED ORDER — SODIUM CHLORIDE 0.9 % IV BOLUS
1000.0000 mL | Freq: Once | INTRAVENOUS | Status: AC
  Administered 2018-03-13: 1000 mL via INTRAVENOUS

## 2018-03-13 NOTE — ED Notes (Signed)
Nurse collecting first set of blood cultures.

## 2018-03-13 NOTE — ED Triage Notes (Addendum)
Pt reports 2 days of generalized weakness with hypotension. Denies any cough, pain with urination, chest pain or shortness of breath. Denies N/V or abdominal pain. Pt only has pain to right shoulder and right upper back, had shoulder surgery the 21st. Pt appears very pale at triage. Hx of anemia. Denies any blood in stools.

## 2018-03-13 NOTE — ED Notes (Signed)
RN Norva Pavlov attempted to get 2nd set of cultures/I stats; unsuccessful. Mini lab notified.

## 2018-03-13 NOTE — ED Provider Notes (Signed)
I saw and evaluated the patient, reviewed the resident's note and I agree with the findings and plan.  EKG: EKG Interpretation  Date/Time:  Thursday March 13 2018 21:02:24 EDT Ventricular Rate:  57 PR Interval:  134 QRS Duration: 76 QT Interval:  460 QTC Calculation: 447 R Axis:   -21 Text Interpretation:  Sinus bradycardia Possible Inferior infarct , age undetermined Abnormal ECG Confirmed by Lacretia Leigh [phone]) on 03/13/2018 9:58:31 PM  67 year old female here complaining of 2 days of generalized weakness with hypotension.  Denies any associated chest pain or shortness of breath.  No abdominal pain.  No recent medication changes.  Does have some flank pain however.  Labs are significant for normal hemoglobin.  Lites lites are pending.  Will scan abdominal x-rays are pending.  Will perform abdominal CT.   Lacretia Leigh, MD 03/13/18 2228

## 2018-03-13 NOTE — ED Provider Notes (Signed)
[facility] EMERGENCY DEPARTMENT Provider Note   CSN: [id number] Arrival date & time: 03/13/18  2047   History   Chief Complaint Chief Complaint  Patient presents with  . Hypotension  . Weakness    HPI Tamieka Rancourt is a 67 y.o. female.  The history is provided by the patient.   67 yo F with PMhx of nephrolithiasis, CDK, DM, HTN, anemia who presents with gradually worsening moderate generalized weakness x 2 days. Family members who are EMTs checked her BP yesterday, found it to be 70s/40s with low HR in 26s. No similar symptoms in the past. Only other complaint is left flank pain. Denies decreased PO intake, dysuria, cough, fever, hematochezia or melena. Denies chest pain, dyspnea, headaches. Only new medication is zanaflex, which she has been taking since a recent shoulder surgery. States she only takes this 4 times a day.   Past Medical History:  Diagnosis Date  . Chronic kidney disease    kidney stones, right and left  . Diabetes mellitus without complication ([place])   . Headache    migraines  . Hyperlipidemia   . Hypertension   . Iron deficiency anemia   . Renal stone   . Seizures ([place])    none x 30 yrs    Patient Active Problem List   Diagnosis Date Noted  . Chronic kidney disease 09/06/2016  . Anemia, iron deficiency 06/23/2016  . Vitamin B12 deficiency (dietary) anemia 06/23/2016  . Renal stone 05/14/2016  . Type 2 diabetes mellitus ([place]) 01/04/2016  . Migraine, chronic, without aura, intractable, with status migrainosus 01/04/2016  . Abdominal pain   . 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 BMI 33.0-33.9,adult 03/30/2013  . Recurrent UTI 09/17/2012  . DM (diabetes mellitus) ([place]) 03/19/2012  . HTN (hypertension) 03/19/2012  . Hyperlipemia 03/19/2012  . Hypothyroidism 03/19/2012  . Seizure disorder ([place]) 03/19/2012  . Insomnia 03/19/2012  . Migraine 03/19/2012    Past Surgical History:    Procedure Laterality Date  . CESAREAN SECTION    . CHOLECYSTECTOMY    . KIDNEY STONE SURGERY    . NEPHROLITHOTOMY Right 05/14/2016   Procedure: NEPHROLITHOTOMY PERCUTANEOUS;  Surgeon: Franchot Gallo, MD;  Location: [facility];  Service: Urology;  Laterality: Right;  . SHOULDER ARTHROSCOPY Bilateral      OB History   None      Home Medications    Prior to Admission medications   Medication Sig Start Date End Date Taking? Authorizing Provider  amLODipine (NORVASC) 5 MG tablet Take 1 tablet (5 mg total) by mouth daily. Patient taking differently: Take 5 mg by mouth daily with supper.  02/08/18  Yes Shawnee Knapp, MD  aspirin-acetaminophen-caffeine (EXCEDRIN MIGRAINE) [phone] MG tablet Take 2 tablets by mouth every 6 (six) hours as needed for headache or migraine.   Yes [provider]  atorvastatin (LIPITOR) 20 MG tablet Take 1 tablet (20 mg total) by mouth daily. Patient taking differently: Take 20 mg by mouth daily with supper.  03/03/18  Yes Shawnee Knapp, MD  butorphanol (STADOL) 10 MG/ML nasal spray PLACE 1 SPRAY INTO THE NOSE EVERY 4 HOURS AS NEEDED FOR MIGRAINE. 02/08/18  Yes Shawnee Knapp, MD  glipiZIDE (GLUCOTROL) 10 MG tablet Take 1 tablet (10 mg total) by mouth 2 (two) times daily before a meal. Patient taking differently: Take 20 mg by mouth 2 (two) times daily before a meal.  02/08/18  Yes Shawnee Knapp, MD  levothyroxine (SYNTHROID,  LEVOTHROID) 75 MCG tablet Take 1 tablet (75 mcg total) by mouth daily. Patient taking differently: Take 75 mcg by mouth daily with supper.  03/03/18  Yes Shawnee Knapp, MD  losartan (COZAAR) 50 MG tablet Take 1 tablet (50 mg total) by mouth daily. Patient taking differently: Take 50 mg by mouth daily with supper.  02/08/18  Yes Shawnee Knapp, MD  PHENobarbital (LUMINAL) 97.2 MG tablet TAKE 2 TABLETS BY MOUTH ONCE DAILY ON MON,WED,AND FRI AND 1 TABLET DAILY ON OTHER DAYS Patient taking differently: Take 97.2-194.4 mg by mouth See admin instructions. Take  194.4 mg by mouth with supper on Mon/Wed/Fri and 97.2 mg on Sun/Tues/Thurs/Sat 01/04/18  Yes Shawnee Knapp, MD  tiZANidine (ZANAFLEX) 4 MG tablet TAKE 1 TABLET BY MOUTH EVERY 6 hrs prn MUSCLE SPASMS. AND TAKE EVERY NIGHT BEFORE BED Patient taking differently: Take 4 mg by mouth every 6 (six) hours as needed for muscle spasms.  03/03/18  Yes Shawnee Knapp, MD  vitamin B-12 1000 MCG tablet Take 1 tablet (1,000 mcg total) by mouth daily. 06/26/16  Yes Short, Noah Delaine, MD  zolpidem (AMBIEN) 10 MG tablet take 1 tablet by mouth at bedtime for sleep if needed. Patient taking differently: Take 10 mg by mouth at bedtime.  11/23/17  Yes Shawnee Knapp, MD  clobetasol cream (TEMOVATE) 0.05 % APPLY TO AFFECTED AREA ONCE EVERY DAY AS NEEDED Patient not taking: Reported on 03/13/2018 09/20/17   Shawnee Knapp, MD  estradiol (ESTRACE) 0.1 MG/GM vaginal cream Use 1 fingertip amount to urethra, clitoris, and vaginal introitus every night x 2 weeks, then decreased to 2-3x weekly 02/08/18   Shawnee Knapp, MD  Vaginal Lubricant (REPLENS) GEL Place 1 application vaginally 3 (three) times a week. 02/10/18   Shawnee Knapp, MD    Family History Family History  Problem Relation Age of Onset  . Heart disease Father   . Diabetes Father     Social History Social History   Tobacco Use  . Smoking status: Never Smoker  . Smokeless tobacco: Never Used  Substance Use Topics  . Alcohol use: No    Alcohol/week: 0.0 oz  . Drug use: No     Allergies   Compazine; Cymbalta [duloxetine hcl]; Methadone; Duloxetine; Escitalopram; Macrobid [nitrofurantoin monohyd macro]; and Nitrofuran derivatives   Review of Systems Review of Systems  Constitutional: Positive for fatigue. Negative for chills and fever.  HENT: Negative for ear pain and sore throat.   Eyes: Negative for pain and visual disturbance.  Respiratory: Negative for cough and shortness of breath.   Cardiovascular: Negative for chest pain and palpitations.  Gastrointestinal:  Negative for abdominal pain and vomiting.  Genitourinary: Positive for flank pain. Negative for dysuria and hematuria.  Musculoskeletal: Negative for arthralgias and back pain.  Skin: Negative for color change and rash.  Neurological: Positive for weakness (generalized). Negative for seizures and syncope.  All other systems reviewed and are negative.    Physical Exam Updated Vital Signs BP (!) 100/57   Pulse (!) 49   Temp 97.8 F (36.6 C)   Resp 18   Ht 4\' 8"  (1.422 m)   Wt 70.3 kg (155 lb)   SpO2 96%   BMI 34.75 kg/m   Physical Exam  Constitutional: She is oriented to person, place, and time. She appears well-developed and well-nourished. No distress.  HENT:  Head: Normocephalic and atraumatic.  Mouth/Throat: Oropharynx is clear and moist.  Eyes: Conjunctivae and EOM are normal.  Neck:  Normal range of motion. Neck supple.  Cardiovascular: Regular rhythm and intact distal pulses. Bradycardia present.  No murmur heard. Pulmonary/Chest: Effort normal and breath sounds normal. No stridor. No respiratory distress. She has no wheezes.  Abdominal: Soft. She exhibits no distension. There is tenderness. There is CVA tenderness (L). There is no rebound and no guarding.  Musculoskeletal: She exhibits no edema.  Neurological: She is alert and oriented to person, place, and time. No cranial nerve deficit or sensory deficit.  Skin: Skin is warm and dry. There is pallor.  Psychiatric: She has a normal mood and affect.  Nursing note and vitals reviewed.    ED Treatments / Results  Labs (all labs ordered are listed, but only abnormal results are displayed) Labs Reviewed  COMPREHENSIVE METABOLIC PANEL - Abnormal; Notable for the following components:      Result Value   Glucose, Bld 187 (*)    BUN 27 (*)    Creatinine, Ser 1.76 (*)    GFR calc non Af Amer 28 (*)    GFR calc Af Amer 33 (*)    All other components within normal limits  CBC WITH DIFFERENTIAL/PLATELET - Abnormal;  Notable for the following components:   RBC 3.65 (*)    Hemoglobin 11.3 (*)    MCV 100.8 (*)    All other components within normal limits  I-STAT CG4 LACTIC ACID, ED - Abnormal; Notable for the following components:   Lactic Acid, Venous 2.97 (*)    All other components within normal limits  I-STAT CHEM 8, ED - Abnormal; Notable for the following components:   BUN 28 (*)    Creatinine, Ser 1.50 (*)    Glucose, Bld 167 (*)    TCO2 21 (*)    Hemoglobin 10.5 (*)    HCT 31.0 (*)    All other components within normal limits  CULTURE, BLOOD (ROUTINE X 2)  CULTURE, BLOOD (ROUTINE X 2)  URINE CULTURE  URINALYSIS, ROUTINE W REFLEX MICROSCOPIC  TSH  I-STAT TROPONIN, ED  I-STAT CG4 LACTIC ACID, ED  TYPE AND SCREEN  ABO/RH    EKG EKG Interpretation  Date/Time:  Thursday March 13 2018 21:02:24 EDT Ventricular Rate:  57 PR Interval:  134 QRS Duration: 76 QT Interval:  460 QTC Calculation: 447 R Axis:   -21 Text Interpretation:  Sinus bradycardia Possible Inferior infarct , age undetermined Abnormal ECG Confirmed by Lacretia Leigh ([id number]) on 03/13/2018 9:54:21 PM   Radiology Dg Chest Portable 1 View  Result Date: 03/13/2018 CLINICAL DATA:  Generalized weakness with hypotension 2 days. Right shoulder and upper back pain. EXAM: PORTABLE CHEST 1 VIEW COMPARISON:  04/12/2016 FINDINGS: Patient slightly rotated to the right. Lungs are adequately inflated without focal consolidation or effusion. Cardiomediastinal silhouette and remainder of the exam is unchanged. IMPRESSION: No active disease. Electronically Signed   By: Marin Olp M.D.   On: 03/13/2018 22:07    Procedures Procedures (including critical care time)  Medications Ordered in ED Medications  sodium chloride 0.9 % bolus 1,000 mL (has no administration in time range)  sodium chloride 0.9 % bolus 1,000 mL (0 mLs Intravenous Stopped 03/13/18 2242)     Initial Impression / Assessment and Plan / ED Course  I have reviewed the  triage vital signs and the nursing notes.  Pertinent labs & imaging results that were available during my care of the patient were reviewed by me and considered in my medical decision making (see chart for details).  Jasmen Emrich is a 67 y.o. female with PMHx of nephrolithiasis, HTN, CKD, DM who p/w generalized weakness x 2 days. Reviewed and confirmed nursing documentation for past medical history, family history, social history. VS afebrile, HR 40s-50s, BP 77/40. Exam remarkable for pale appearance, L CVA tenderness but benign abd. Ddx includes shock 2/2 blood loss, infectious etiology. Bedside u/s limited secondary to habitus, but normal appearing aorta with no abd tenderness or pulsatile mass to suggest AAA. Possible nephrolithasis vs pyelo given L CVA tenderness. Possible medication related considering Zanaflex and CKD.   EKG with sinus bradycardia, no STE/D. BP improved to 759 systolic after 1L NS. Another 1L bolus ordered. Lactic acid elevated at 2.97. Trop neg. CMP with AKI on CKD, Cr 1.76, otherwise unremarkable. CBC with no leukocytosis, macrocytic anemia with hgb 11.3 (decreased from prior 1 year ago, but similar to past levels). CXR neg. CT abd/pelvis w/o contrast pending at time of handoff. Blood cultures drawn. Pending UA.   Old records reviewed. Labs reviewed by me and used in the medical decision making.  Imaging viewed and interpreted by me and used in the medical decision making (formal interpretation from radiologist). EKG reviewed by me and used in the medical decision making.   Handoff to [place] at approx 1638.    Final Clinical Impressions(s) / ED Diagnoses   Final diagnoses:  AKI (acute kidney injury) ([place])  Generalized weakness  Hypotension, unspecified hypotension type    ED Discharge Orders    None       Norm Salt, MD 03/14/18 0019    Lacretia Leigh, MD 03/14/18 1625

## 2018-03-14 ENCOUNTER — Emergency Department (HOSPITAL_COMMUNITY): Payer: Medicare Other

## 2018-03-14 DIAGNOSIS — E1129 Type 2 diabetes mellitus with other diabetic kidney complication: Secondary | ICD-10-CM | POA: Diagnosis not present

## 2018-03-14 DIAGNOSIS — N179 Acute kidney failure, unspecified: Secondary | ICD-10-CM | POA: Diagnosis present

## 2018-03-14 DIAGNOSIS — G40909 Epilepsy, unspecified, not intractable, without status epilepticus: Secondary | ICD-10-CM | POA: Diagnosis not present

## 2018-03-14 DIAGNOSIS — N201 Calculus of ureter: Secondary | ICD-10-CM | POA: Diagnosis not present

## 2018-03-14 DIAGNOSIS — N183 Chronic kidney disease, stage 3 unspecified: Secondary | ICD-10-CM | POA: Diagnosis present

## 2018-03-14 DIAGNOSIS — R531 Weakness: Secondary | ICD-10-CM | POA: Insufficient documentation

## 2018-03-14 DIAGNOSIS — N390 Urinary tract infection, site not specified: Secondary | ICD-10-CM | POA: Diagnosis not present

## 2018-03-14 DIAGNOSIS — N202 Calculus of kidney with calculus of ureter: Secondary | ICD-10-CM | POA: Diagnosis present

## 2018-03-14 DIAGNOSIS — I129 Hypertensive chronic kidney disease with stage 1 through stage 4 chronic kidney disease, or unspecified chronic kidney disease: Secondary | ICD-10-CM | POA: Diagnosis present

## 2018-03-14 DIAGNOSIS — I959 Hypotension, unspecified: Secondary | ICD-10-CM | POA: Diagnosis not present

## 2018-03-14 DIAGNOSIS — Z888 Allergy status to other drugs, medicaments and biological substances status: Secondary | ICD-10-CM | POA: Diagnosis not present

## 2018-03-14 DIAGNOSIS — E1122 Type 2 diabetes mellitus with diabetic chronic kidney disease: Secondary | ICD-10-CM | POA: Diagnosis present

## 2018-03-14 DIAGNOSIS — N12 Tubulo-interstitial nephritis, not specified as acute or chronic: Secondary | ICD-10-CM | POA: Diagnosis present

## 2018-03-14 DIAGNOSIS — E785 Hyperlipidemia, unspecified: Secondary | ICD-10-CM | POA: Diagnosis not present

## 2018-03-14 DIAGNOSIS — E860 Dehydration: Secondary | ICD-10-CM | POA: Diagnosis present

## 2018-03-14 DIAGNOSIS — I10 Essential (primary) hypertension: Secondary | ICD-10-CM | POA: Diagnosis not present

## 2018-03-14 DIAGNOSIS — R1032 Left lower quadrant pain: Secondary | ICD-10-CM | POA: Diagnosis not present

## 2018-03-14 DIAGNOSIS — Z885 Allergy status to narcotic agent status: Secondary | ICD-10-CM | POA: Diagnosis not present

## 2018-03-14 DIAGNOSIS — E872 Acidosis: Secondary | ICD-10-CM | POA: Diagnosis present

## 2018-03-14 DIAGNOSIS — E039 Hypothyroidism, unspecified: Secondary | ICD-10-CM | POA: Diagnosis not present

## 2018-03-14 DIAGNOSIS — N132 Hydronephrosis with renal and ureteral calculous obstruction: Secondary | ICD-10-CM | POA: Diagnosis not present

## 2018-03-14 LAB — GLUCOSE, CAPILLARY
GLUCOSE-CAPILLARY: 145 mg/dL — AB (ref 65–99)
GLUCOSE-CAPILLARY: 152 mg/dL — AB (ref 65–99)
Glucose-Capillary: 98 mg/dL (ref 65–99)
Glucose-Capillary: 113 mg/dL — ABNORMAL HIGH (ref 65–99)
Glucose-Capillary: 141 mg/dL — ABNORMAL HIGH (ref 65–99)
Glucose-Capillary: 187 mg/dL — ABNORMAL HIGH (ref 65–99)

## 2018-03-14 LAB — CBC
HCT: 39.8 % (ref 36.0–46.0)
Hemoglobin: 12.4 g/dL (ref 12.0–15.0)
MCH: 31 pg (ref 26.0–34.0)
MCHC: 31.2 g/dL (ref 30.0–36.0)
MCV: 99.5 fL (ref 78.0–100.0)
Platelets: 91 10*3/uL — ABNORMAL LOW (ref 150–400)
RBC: 4 MIL/uL (ref 3.87–5.11)
RDW: 12.8 % (ref 11.5–15.5)
WBC: 6.2 10*3/uL (ref 4.0–10.5)

## 2018-03-14 LAB — URINALYSIS, ROUTINE W REFLEX MICROSCOPIC
Bilirubin Urine: NEGATIVE
Glucose, UA: NEGATIVE mg/dL
Hgb urine dipstick: NEGATIVE
KETONES UR: NEGATIVE mg/dL
Nitrite: NEGATIVE
PROTEIN: NEGATIVE mg/dL
Specific Gravity, Urine: 1.01 (ref 1.005–1.030)
pH: 5 (ref 5.0–8.0)

## 2018-03-14 LAB — I-STAT CG4 LACTIC ACID, ED: LACTIC ACID, VENOUS: 1.73 mmol/L (ref 0.5–1.9)

## 2018-03-14 LAB — BASIC METABOLIC PANEL
Anion gap: 9 (ref 5–15)
BUN: 25 mg/dL — AB (ref 6–20)
CALCIUM: 9.2 mg/dL (ref 8.9–10.3)
CO2: 21 mmol/L — ABNORMAL LOW (ref 22–32)
CREATININE: 1.37 mg/dL — AB (ref 0.44–1.00)
Chloride: 109 mmol/L (ref 101–111)
GFR, EST AFRICAN AMERICAN: 44 mL/min — AB (ref >60)
GFR, EST NON AFRICAN AMERICAN: 38 mL/min — AB (ref >60)
Glucose, Bld: 126 mg/dL — ABNORMAL HIGH (ref 65–99)
Potassium: 4.6 mmol/L (ref 3.5–5.1)
SODIUM: 139 mmol/L (ref 135–145)

## 2018-03-14 LAB — PROCALCITONIN

## 2018-03-14 LAB — PROTIME-INR
INR: 0.98
INR: 1.04
PROTHROMBIN TIME: 12.9 s (ref 11.4–15.2)
PROTHROMBIN TIME: 13.5 s (ref 11.4–15.2)

## 2018-03-14 LAB — LACTIC ACID, PLASMA: Lactic Acid, Venous: 1.5 mmol/L (ref 0.5–1.9)

## 2018-03-14 LAB — TSH: TSH: 1.258 u[IU]/mL (ref 0.350–4.500)

## 2018-03-14 LAB — APTT: aPTT: 28 seconds (ref 24–36)

## 2018-03-14 LAB — HIV ANTIBODY (ROUTINE TESTING W REFLEX): HIV SCREEN 4TH GENERATION: NONREACTIVE

## 2018-03-14 LAB — CORTISOL-AM, BLOOD: Cortisol - AM: 4 ug/dL — ABNORMAL LOW (ref 6.7–22.6)

## 2018-03-14 MED ORDER — INSULIN ASPART 100 UNIT/ML ~~LOC~~ SOLN: 0.0000 [IU] | Freq: Every day | SUBCUTANEOUS | Status: DC

## 2018-03-14 MED ORDER — SODIUM CHLORIDE 0.9 % IV SOLN
INTRAVENOUS | Status: DC
  Administered 2018-03-14 – 2018-03-15 (×4): via INTRAVENOUS

## 2018-03-14 MED ORDER — SODIUM CHLORIDE 0.9 % IV SOLN
1.0000 g | Freq: Two times a day (BID) | INTRAVENOUS | Status: DC
  Administered 2018-03-14 – 2018-03-15 (×4): 1 g via INTRAVENOUS
  Filled 2018-03-14 (×7): qty 1

## 2018-03-14 MED ORDER — TIZANIDINE HCL 2 MG PO TABS
4.0000 mg | ORAL_TABLET | ORAL | Status: DC | PRN
  Administered 2018-03-14 – 2018-03-15 (×3): 4 mg via ORAL
  Filled 2018-03-14 (×3): qty 2

## 2018-03-14 MED ORDER — ZOLPIDEM TARTRATE 5 MG PO TABS
5.0000 mg | ORAL_TABLET | Freq: Every day | ORAL | Status: DC
  Administered 2018-03-14: 5 mg via ORAL
  Filled 2018-03-14: qty 1

## 2018-03-14 MED ORDER — VITAMIN B-12 1000 MCG PO TABS
1000.0000 ug | ORAL_TABLET | Freq: Every day | ORAL | Status: DC
  Administered 2018-03-14 – 2018-03-15 (×2): 1000 ug via ORAL
  Filled 2018-03-14 (×2): qty 1

## 2018-03-14 MED ORDER — INSULIN ASPART 100 UNIT/ML ~~LOC~~ SOLN
0.0000 [IU] | Freq: Three times a day (TID) | SUBCUTANEOUS | Status: DC
  Administered 2018-03-14: 1 [IU] via SUBCUTANEOUS
  Administered 2018-03-15 (×2): 2 [IU] via SUBCUTANEOUS

## 2018-03-14 MED ORDER — BUTORPHANOL TARTRATE 10 MG/ML NA SOLN: 1.0000 | NASAL | Status: DC | PRN

## 2018-03-14 MED ORDER — LORAZEPAM 2 MG/ML IJ SOLN: 1.0000 mg | INTRAMUSCULAR | Status: DC | PRN

## 2018-03-14 MED ORDER — PHENOBARBITAL 97.2 MG PO TABS: 97.2000 mg | ORAL_TABLET | ORAL | Status: DC

## 2018-03-14 MED ORDER — ACETAMINOPHEN 325 MG PO TABS: 650.0000 mg | ORAL_TABLET | Freq: Four times a day (QID) | ORAL | Status: DC | PRN

## 2018-03-14 MED ORDER — ONDANSETRON HCL 4 MG/2ML IJ SOLN: 4.0000 mg | Freq: Four times a day (QID) | INTRAMUSCULAR | Status: DC | PRN

## 2018-03-14 MED ORDER — ATORVASTATIN CALCIUM 20 MG PO TABS
20.0000 mg | ORAL_TABLET | Freq: Every day | ORAL | Status: DC
  Administered 2018-03-14: 20 mg via ORAL
  Filled 2018-03-14 (×2): qty 1

## 2018-03-14 MED ORDER — HYDRALAZINE HCL 20 MG/ML IJ SOLN: 5.0000 mg | INTRAMUSCULAR | Status: DC | PRN

## 2018-03-14 MED ORDER — LORAZEPAM 2 MG/ML IJ SOLN
0.5000 mg | Freq: Once | INTRAMUSCULAR | Status: AC
  Administered 2018-03-14: 0.5 mg via INTRAVENOUS
  Filled 2018-03-14: qty 1

## 2018-03-14 MED ORDER — POLYETHYLENE GLYCOL 3350 17 G PO PACK: 17.0000 g | PACK | Freq: Every day | ORAL | Status: DC | PRN

## 2018-03-14 MED ORDER — TIZANIDINE HCL 2 MG PO TABS
4.0000 mg | ORAL_TABLET | Freq: Four times a day (QID) | ORAL | Status: DC | PRN
  Administered 2018-03-14 (×2): 4 mg via ORAL
  Filled 2018-03-14 (×2): qty 2

## 2018-03-14 MED ORDER — LEVOTHYROXINE SODIUM 75 MCG PO TABS
75.0000 ug | ORAL_TABLET | Freq: Every day | ORAL | Status: DC
  Administered 2018-03-14: 75 ug via ORAL
  Filled 2018-03-14 (×2): qty 1

## 2018-03-14 MED ORDER — ONDANSETRON HCL 4 MG PO TABS: 4.0000 mg | ORAL_TABLET | Freq: Four times a day (QID) | ORAL | Status: DC | PRN

## 2018-03-14 MED ORDER — HEPARIN SODIUM (PORCINE) 5000 UNIT/ML IJ SOLN
5000.0000 [IU] | Freq: Three times a day (TID) | INTRAMUSCULAR | Status: DC
  Filled 2018-03-14 (×3): qty 1

## 2018-03-14 MED ORDER — INSULIN ASPART 100 UNIT/ML ~~LOC~~ SOLN: 0.0000 [IU] | Freq: Three times a day (TID) | SUBCUTANEOUS | Status: DC

## 2018-03-14 MED ORDER — PHENOBARBITAL 97.2 MG PO TABS
194.4000 mg | ORAL_TABLET | ORAL | Status: DC
  Administered 2018-03-14: 194.4 mg via ORAL
  Filled 2018-03-14: qty 2

## 2018-03-14 MED ORDER — ASPIRIN-ACETAMINOPHEN-CAFFEINE 250-250-65 MG PO TABS
2.0000 | ORAL_TABLET | Freq: Four times a day (QID) | ORAL | Status: DC | PRN
  Filled 2018-03-14: qty 2

## 2018-03-14 MED ORDER — ACETAMINOPHEN 650 MG RE SUPP
650.0000 mg | Freq: Four times a day (QID) | RECTAL | Status: DC | PRN
Start: 2018-03-14 — End: 2018-03-15

## 2018-03-14 NOTE — ED Provider Notes (Signed)
Is signed out to me pending CT scan.  CT of abdomen and pelvis shows no acute process.  Lactic acid level has normalized.  Creatinine has come down with hydration.  Case is discussed with Dr. Blaine Hamper of [facility], who agrees to admit the patient.   Delora Fuel, MD 40/99/27 [phone]

## 2018-03-14 NOTE — H&P (Signed)
History and Physical    Natalie [name] PTW:[id number] DOB: [birth year] DOA: 03/13/2018  Referring MD/NP/PA:   PCP: Shawnee Knapp, MD   Patient coming from:  The patient is coming from home.  At baseline, pt is independent for most of ADL.    Chief Complaint: Generalized weakness, hypotension, left flank pain  HPI: Natalie [name] is a 67 y.o. female with medical history significant of nephrolithiasis, CDK-3, DM, HTN, anemia, hyperlipidemia, hypothyroidism, seizure, anemia, who presents with generalized weakness, hypotension and left flank pain.  Patient states that she has been having generalized weakness and fatigue for more than 2 days, which has worsened today.  She checked her blood pressure which is low, at 70s/40s.  She also reports left flank pain, which is dull, constant, mild and nonradiating.  She denies hematuria, dysuria or burning on urination.  No increased urinary frequency.  Denies fever or chills.  No chest pain, shortness of breath, cough.  Denies nausea, vomiting, diarrhea or abdominal pain.  No unilateral weakness.  Her initial blood pressure is 77/40, which improved to 111/50 after giving 2 liters of normal saline bolus in ED.  ED Course: pt was found to have positive urinalysis with large amount of leukocyte, worsening renal function, bradycardia, no tachypnea, oxygen saturation 96% on room air, negative chest x-ray.  Patient is admitted to telemetry bed as inpatient.  Urology, Dr. Alyson Ingles was consulted.  CT abdomen/pelvis showed 1. Nonobstructive right-sided nephrolithiasis with mild pelviectasis. 2. Chronic left renal atrophy with large obstructing left ureterovesical junction stone measuring 10 mm, unchanged in position over multiple studies.  Review of Systems:   General: no fevers, chills, no body weight gain, has poor appetite, has fatigue HEENT: no blurry vision, hearing changes or sore throat Respiratory: no dyspnea, coughing, wheezing CV: no chest pain, no  palpitations GI: no nausea, vomiting, abdominal pain, diarrhea, constipation GU: no dysuria, burning on urination, increased urinary frequency, hematuria. Has right flank pain. Ext: no leg edema Neuro: no unilateral weakness, numbness, or tingling, no vision change or hearing loss Skin: no rash, no skin tear. MSK: No muscle spasm, no deformity, no limitation of range of movement in spin Heme: No easy bruising.  Travel history: No recent long distant travel.  Allergy:  Allergies  Allergen Reactions  . Compazine Other (See Comments)    Bad headache, vomiting   . Cymbalta [Duloxetine Hcl] Other (See Comments)    "Kept me awake"  . Methadone Hives  . Duloxetine Anxiety  . Escitalopram Anxiety  . Macrobid [Nitrofurantoin Monohyd Macro] Rash  . Nitrofuran Derivatives Rash    Past Medical History:  Diagnosis Date  . Chronic kidney disease    kidney stones, right and left  . Diabetes mellitus without complication ([place])   . Headache    migraines  . Hyperlipidemia   . Hypertension   . Iron deficiency anemia   . Renal stone   . Seizures ([place])    none x 30 yrs    Past Surgical History:  Procedure Laterality Date  . CESAREAN SECTION    . CHOLECYSTECTOMY    . KIDNEY STONE SURGERY    . NEPHROLITHOTOMY Right 05/14/2016   Procedure: NEPHROLITHOTOMY PERCUTANEOUS;  Surgeon: Franchot Gallo, MD;  Location: [facility];  Service: Urology;  Laterality: Right;  . SHOULDER ARTHROSCOPY Bilateral     Social History:  reports that she has never smoked. She has never used smokeless tobacco. She reports that she does not drink alcohol or use drugs.  Family History:  Family History  Problem Relation Age of Onset  . Heart disease Father   . Diabetes Father      Prior to Admission medications   Medication Sig Start Date End Date Taking? Authorizing Provider  amLODipine (NORVASC) 5 MG tablet Take 1 tablet (5 mg total) by mouth daily. Patient taking differently: Take 5 mg by mouth daily with  supper.  02/08/18  Yes Shawnee Knapp, MD  aspirin-acetaminophen-caffeine (EXCEDRIN MIGRAINE) [phone] MG tablet Take 2 tablets by mouth every 6 (six) hours as needed for headache or migraine.   Yes [provider]  atorvastatin (LIPITOR) 20 MG tablet Take 1 tablet (20 mg total) by mouth daily. Patient taking differently: Take 20 mg by mouth daily with supper.  03/03/18  Yes Shawnee Knapp, MD  butorphanol (STADOL) 10 MG/ML nasal spray PLACE 1 SPRAY INTO THE NOSE EVERY 4 HOURS AS NEEDED FOR MIGRAINE. 02/08/18  Yes Shawnee Knapp, MD  glipiZIDE (GLUCOTROL) 10 MG tablet Take 1 tablet (10 mg total) by mouth 2 (two) times daily before a meal. Patient taking differently: Take 20 mg by mouth 2 (two) times daily before a meal.  02/08/18  Yes Shawnee Knapp, MD  levothyroxine (SYNTHROID, LEVOTHROID) 75 MCG tablet Take 1 tablet (75 mcg total) by mouth daily. Patient taking differently: Take 75 mcg by mouth daily with supper.  03/03/18  Yes Shawnee Knapp, MD  losartan (COZAAR) 50 MG tablet Take 1 tablet (50 mg total) by mouth daily. Patient taking differently: Take 50 mg by mouth daily with supper.  02/08/18  Yes Shawnee Knapp, MD  PHENobarbital (LUMINAL) 97.2 MG tablet TAKE 2 TABLETS BY MOUTH ONCE DAILY ON MON,WED,AND FRI AND 1 TABLET DAILY ON OTHER DAYS Patient taking differently: Take 97.2-194.4 mg by mouth See admin instructions. Take 194.4 mg by mouth with supper on Mon/Wed/Fri and 97.2 mg on Sun/Tues/Thurs/Sat 01/04/18  Yes Shawnee Knapp, MD  tiZANidine (ZANAFLEX) 4 MG tablet TAKE 1 TABLET BY MOUTH EVERY 6 hrs prn MUSCLE SPASMS. AND TAKE EVERY NIGHT BEFORE BED Patient taking differently: Take 4 mg by mouth every 6 (six) hours as needed for muscle spasms.  03/03/18  Yes Shawnee Knapp, MD  vitamin B-12 1000 MCG tablet Take 1 tablet (1,000 mcg total) by mouth daily. 06/26/16  Yes Short, Noah Delaine, MD  zolpidem (AMBIEN) 10 MG tablet take 1 tablet by mouth at bedtime for sleep if needed. Patient taking differently: Take 10 mg by  mouth at bedtime.  11/23/17  Yes Shawnee Knapp, MD  clobetasol cream (TEMOVATE) 0.05 % APPLY TO AFFECTED AREA ONCE EVERY DAY AS NEEDED Patient not taking: Reported on 03/13/2018 09/20/17   Shawnee Knapp, MD  estradiol (ESTRACE) 0.1 MG/GM vaginal cream Use 1 fingertip amount to urethra, clitoris, and vaginal introitus every night x 2 weeks, then decreased to 2-3x weekly 02/08/18   Shawnee Knapp, MD  Vaginal Lubricant (REPLENS) GEL Place 1 application vaginally 3 (three) times a week. 02/10/18   Shawnee Knapp, MD    Physical Exam: Vitals:   03/13/18 2200 03/13/18 2245 03/13/18 2345 03/14/18 0045  BP: (!) 100/57 (!) 104/56 112/65 (!) 111/54  Pulse: (!) 49 (!) 48 (!) 50 (!) 51  Resp: 18 12 13 14   Temp:      SpO2: 96% 98% 98% 98%  Weight:      Height:       General: Not in acute distress HEENT:       Eyes: PERRL, EOMI,  no scleral icterus.       ENT: No discharge from the ears and nose, no pharynx injection, no tonsillar enlargement.        Neck: No JVD, no bruit, no mass felt. Heme: No neck lymph node enlargement. Cardiac: S1/S2, RRR, No murmurs, No gallops or rubs. Respiratory: No rales, wheezing, rhonchi or rubs. GI: Soft, nondistended, nontender, no rebound pain, no organomegaly, BS present. GU: has positive L CVA tenderness. Ext: No pitting leg edema bilaterally. 2+DP/PT pulse bilaterally. Musculoskeletal: No joint deformities, No joint redness or warmth, no limitation of ROM in spin. Skin: No rashes.  Neuro: Alert, oriented X3, cranial nerves II-XII grossly intact, moves all extremities normally. Psych: Patient is not psychotic, no suicidal or hemocidal ideation.  Labs on Admission: I have personally reviewed following labs and imaging studies  CBC: Recent Labs  Lab 03/13/18 2102 03/13/18 2253  WBC 7.2  --   NEUTROABS 4.0  --   HGB 11.3* 10.5*  HCT 36.8 31.0*  MCV 100.8*  --   PLT 294  --    Basic Metabolic Panel: Recent Labs  Lab 03/13/18 2102 03/13/18 2253  NA 139 139  K  4.9 5.0  CL 104 109  CO2 24  --   GLUCOSE 187* 167*  BUN 27* 28*  CREATININE 1.76* 1.50*  CALCIUM 9.4  --    GFR: Estimated Creatinine Clearance: 27.9 mL/min (A) (by C-G formula based on SCr of 1.5 mg/dL (H)). Liver Function Tests: Recent Labs  Lab 03/13/18 2102  AST 35  ALT 45  ALKPHOS 69  BILITOT 0.4  PROT 6.5  ALBUMIN 3.7   No results for input(s): LIPASE, AMYLASE in the last 168 hours. No results for input(s): AMMONIA in the last 168 hours. Coagulation Profile: No results for input(s): INR, PROTIME in the last 168 hours. Cardiac Enzymes: No results for input(s): CKTOTAL, CKMB, CKMBINDEX, TROPONINI in the last 168 hours. BNP (last 3 results) No results for input(s): PROBNP in the last 8760 hours. HbA1C: No results for input(s): HGBA1C in the last 72 hours. CBG: No results for input(s): GLUCAP in the last 168 hours. Lipid Profile: No results for input(s): CHOL, HDL, LDLCALC, TRIG, CHOLHDL, LDLDIRECT in the last 72 hours. Thyroid Function Tests: Recent Labs    03/13/18 2350  TSH 1.258   Anemia Panel: No results for input(s): VITAMINB12, FOLATE, FERRITIN, TIBC, IRON, RETICCTPCT in the last 72 hours. Urine analysis:    Component Value Date/Time   COLORURINE YELLOW 03/13/2018 2335   APPEARANCEUR HAZY (A) 03/13/2018 2335   LABSPEC 1.010 03/13/2018 2335   PHURINE 5.0 03/13/2018 2335   GLUCOSEU NEGATIVE 03/13/2018 2335   HGBUR NEGATIVE 03/13/2018 2335   BILIRUBINUR NEGATIVE 03/13/2018 2335   BILIRUBINUR negative 07/29/2017 1558   BILIRUBINUR neg 11/30/2014 1834   KETONESUR NEGATIVE 03/13/2018 2335   PROTEINUR NEGATIVE 03/13/2018 2335   UROBILINOGEN 0.2 07/29/2017 1558   UROBILINOGEN 0.2 08/31/2014 2056   NITRITE NEGATIVE 03/13/2018 2335   LEUKOCYTESUR MODERATE (A) 03/13/2018 2335   Sepsis Labs: @LABRCNTIP (procalcitonin:4,lacticidven:4) )No results found for this or any previous visit (from the past 240 hour(s)).   Radiological Exams on Admission: Ct  Abdomen Pelvis Wo Contrast  Result Date: 03/14/2018 CLINICAL DATA:  Flank pain EXAM: CT ABDOMEN AND PELVIS WITHOUT CONTRAST TECHNIQUE: Multidetector CT imaging of the abdomen and pelvis was performed following the standard protocol without IV contrast. COMPARISON:  CT abdomen pelvis 06/22/2016 FINDINGS: LOWER CHEST: No basilar pulmonary nodules or pleural effusion. No apical pericardial effusion.  HEPATOBILIARY: Normal hepatic contours and density. No intra- or extrahepatic biliary dilatation. Status post cholecystectomy. PANCREAS: Normal parenchymal contours without ductal dilatation. No peripancreatic fluid collection. SPLEEN: Normal. ADRENALS/URINARY TRACT: --Adrenal glands: Normal. --Right kidney/ureter: There is pelviectasis. 8 x 6 mm stone at the right lower pole. --Left kidney/ureter: Chronic left renal atrophy with large obstructing stone at the left ureterovesical junction, unchanged, measuring 10 mm. --Urinary bladder: Normal for degree of distention STOMACH/BOWEL: --Stomach/Duodenum: Small hiatal hernia.  Normal duodenal course. --Small bowel: No dilatation or inflammation. --Colon: No focal abnormality. --Appendix: Normal. VASCULAR/LYMPHATIC: Atherosclerotic calcification is present within the non-aneurysmal abdominal aorta, without hemodynamically significant stenosis. No abdominal or pelvic lymphadenopathy. REPRODUCTIVE: Normal uterus and ovaries. MUSCULOSKELETAL. Multilevel degenerative disc disease and facet arthrosis. No bony spinal canal stenosis. OTHER: None. IMPRESSION: 1. Nonobstructive right-sided nephrolithiasis with mild pelviectasis. 2. Chronic left renal atrophy with large obstructing left ureterovesical junction stone measuring 10 mm, unchanged in position over multiple studies. 3.  Aortic Atherosclerosis (ICD10-I70.0). Electronically Signed   By: Ulyses Jarred M.D.   On: 03/14/2018 00:46   Dg Chest Portable 1 View  Result Date: 03/13/2018 CLINICAL DATA:  Generalized weakness with  hypotension 2 days. Right shoulder and upper back pain. EXAM: PORTABLE CHEST 1 VIEW COMPARISON:  04/12/2016 FINDINGS: Patient slightly rotated to the right. Lungs are adequately inflated without focal consolidation or effusion. Cardiomediastinal silhouette and remainder of the exam is unchanged. IMPRESSION: No active disease. Electronically Signed   By: Marin Olp M.D.   On: 03/13/2018 22:07     EKG: Independently reviewed.  Sinus rhythm, bradycardia, QTC 447, mild T wave inversion in V1-V4, low voltage.  Assessment/Plan Principal Problem:   Complicated UTI (urinary tract infection) Active Problems:   Type II diabetes mellitus with renal manifestations (HCC)   HTN (hypertension)   Hyperlipemia   Hypothyroidism   Seizure disorder (HCC)   Pyelonephritis   Acute renal failure superimposed on stage 3 chronic kidney disease (HCC)   Hypotension   Complicated UTI and pyelonephritis: Patient has nonobstructive kidney stone on left ureterovesical junction, and a small kidney stone on the right.  She has positive urinalysis for UTI. Pt had positive urine culture for multiple organisms, including enterococcus, Pseudomonas, Klebsiella, will need antibiotics with broad coverage today.  Urology, Dr. Alyson Ingles was consulted.  - Admit to telemetry bed as inpt -  Meropenem by IV - Follow up results of urine and blood cx and amend antibiotic regimen if needed per sensitivity results - prn Zofran for nausea - Follow-up with urologist recommendation - Keep patient n.p.o. in case patient need procedure  Hypotension: Patient does not have fever or leukocytosis, does not meet criteria for sepsis, but lactic acid is elevated 2.97, 1.74, plus hypotension indicating patient may have an early stage of sepsis secondary to pyelonephritis.  Pressure responded to IV fluid, improved to 111/54. - Antibiotics as above - will get Procalcitonin and trend lactic acid levels per sepsis protocol. -  IVF: 2L of NS bolus in  ED, followed by 125 cc/h  -  hold blood pressure medications -  check cortisol level  AoCKD-III: Baseline Cre is 1.2,  pt's Cre is 1.76 on admission. Likely due to UTI and continuation of ACEI. ATN is also possible given hypotension - IVF as above - Follow up renal function by BMP - Hold Cozaar  HTN (hypertension): now has hypotension -Hold blood pressure medications, amlodipine and cozarr - IV hydralazine.  Tpe II diabetes mellitus with renal manifestations [facility]): Last A1c 8.8, poorly  controled. Patient is taking glipizide at home -SSI  Hyperlipemia: -lipitor  Hypothyroidism: Last TSH was 1.25 today  -Continue home Synthroid -Check TSH  Seizure -Seizure precaution -When necessary Ativan for seizure -Continue Home medications: Lamictal   DVT ppx: SCD Code Status: Full code Family Communication:  Yes, patient's husband at bed side Disposition Plan:  Anticipate discharge back to previous home environment Consults called:  Dr. Alyson Ingles of urology Admission status: Inpatient/tele          Date of Service 03/14/2018    [place] [facility] Pager [phone]  If 7PM-7AM, please contact night-coverage [email or web address] Password [facility] 03/14/2018, 2:37 AM

## 2018-03-14 NOTE — Progress Notes (Signed)
Pharmacy Antibiotic Note  Natalie [name] is a 67 y.o. female admitted on 2/72/5366 with complicated UTI w/ h/o resistant organism.  Pharmacy has been consulted for Merrem dosing.  Plan: Merrem 1g IV Q12H.  Height: 4\' 8"  (142.2 cm) Weight: 155 lb (70.3 kg) IBW/kg (Calculated) : 36.3  Temp (24hrs), Avg:97.8 F (36.6 C), Min:97.8 F (36.6 C), Max:97.8 F (36.6 C)  Recent Labs  Lab 03/13/18 2102 03/13/18 2253 03/13/18 2254 03/14/18 0003  WBC 7.2  --   --   --   CREATININE 1.76* 1.50*  --   --   LATICACIDVEN  --   --  2.97* 1.73    Estimated Creatinine Clearance: 27.9 mL/min (A) (by C-G formula based on SCr of 1.5 mg/dL (H)).    Allergies  Allergen Reactions  . Compazine Other (See Comments)    Bad headache, vomiting   . Cymbalta [Duloxetine Hcl] Other (See Comments)    "Kept me awake"  . Methadone Hives  . Duloxetine Anxiety  . Escitalopram Anxiety  . Macrobid [Nitrofurantoin Monohyd Macro] Rash  . Nitrofuran Derivatives Rash     Thank you for allowing pharmacy to be a part of this patient's care.  Wynona Neat, PharmD, BCPS  03/14/2018 2:25 AM

## 2018-03-14 NOTE — Progress Notes (Signed)
Subjective: Patient admitted this morning, see detailed H&P by Dr Blaine Hamper  67 y.o. female with medical history significant of nephrolithiasis, CDK-3, DM, HTN, anemia, hyperlipidemia, hypothyroidism, seizure, anemia, who presents with generalized weakness, hypotension and left flank pain.  Patient states that she has been having generalized weakness and fatigue for more than 2 days, which has worsened today.  She checked her blood pressure which is low, at 70s/40s.  She also reports left flank pain, which is dull, constant, mild and nonradiating.  She denies hematuria, dysuria or burning on urination.  No increased urinary frequency.  Denies fever or chills.  No chest pain, shortness of breath, cough.  Denies nausea, vomiting, diarrhea or abdominal pain.  No unilateral weakness.  Her initial blood pressure is 77/40, which improved to 111/50 after giving 2 liters of normal saline bolus in ED.  Patient denies pain this morning.  Vitals:   03/14/18 0356 03/14/18 0935  BP: (!) 166/71 (!) 122/59  Pulse: 60 62  Resp: 16 14  Temp: 97.7 F (36.5 C) 97.7 F (36.5 C)  SpO2: 100% 00%      A/P Complicated UTI/pyelonephritis Hypertension Acute on CKD stage III Hypertension Diabetes mellitus Hypothyroidism  Patient has nonobstructive kidney stone in the left ureterovesical junction and a small kidney stone on the right.  UA is positive for UTI, currently on IV meropenem.  Urology has been consulted, will await their recommendations. Follow renal function in a.m.    [place] [facility] [phone]

## 2018-03-14 NOTE — Progress Notes (Signed)
Patient states that she was dreaming and proceeded to take her Lipitor and synthroid medications after going in the room to check on her.

## 2018-03-14 NOTE — Progress Notes (Signed)
Patient refuse her Lipitor, heparin, synthroid and insulin CBG is 187 . She states get out of my room I want to sleep

## 2018-03-14 NOTE — Consult Note (Signed)
Urology Consult  Referring physician: Dr. Blaine Hamper Reason for referral: left ureteral calculus  Chief Complaint: abdominal pain  History of Present Illness: Natalie [name] is a [phone] with CKD, nephrolithiasis, DMII, and recurrent UTIs who was admitted with hypotension, fatigue and generalized abdominal pain. She has been feeling unwell for 3 days. She denies any fevers/chills/sweats. No nausea/vomiting. She was found to have dehydration on presentation to the ER> The abdominal pain is worse on the LLQ and is dull intermittent mild, nonradiaitng. CT scan shows a left distal 1cm ureteral calculus with an atrophic left kidney and no hydronephrosis. WBC count normal. Lactic acidosis resolved with hydration  Past Medical History:  Diagnosis Date  . Chronic kidney disease    kidney stones, right and left  . Diabetes mellitus without complication ([place])   . Headache    migraines  . Hyperlipidemia   . Hypertension   . Iron deficiency anemia   . Renal stone   . Seizures ([place])    none x 30 yrs   Past Surgical History:  Procedure Laterality Date  . CESAREAN SECTION    . CHOLECYSTECTOMY    . KIDNEY STONE SURGERY    . NEPHROLITHOTOMY Right 05/14/2016   Procedure: NEPHROLITHOTOMY PERCUTANEOUS;  Surgeon: Franchot Gallo, MD;  Location: [facility];  Service: Urology;  Laterality: Right;  . SHOULDER ARTHROSCOPY Bilateral     Medications: I have reviewed the patient's current medications. Allergies:  Allergies  Allergen Reactions  . Compazine Other (See Comments)    Bad headache, vomiting   . Cymbalta [Duloxetine Hcl] Other (See Comments)    "Kept me awake"  . Methadone Hives  . Duloxetine Anxiety  . Escitalopram Anxiety  . Macrobid [Nitrofurantoin Monohyd Macro] Rash  . Nitrofuran Derivatives Rash    Family History  Problem Relation Age of Onset  . Heart disease Father   . Diabetes Father    Social History:  reports that she has never smoked. She has never used smokeless tobacco. She reports that  she does not drink alcohol or use drugs.  Review of Systems  Genitourinary: Positive for frequency and urgency.  All other systems reviewed and are negative.   Physical Exam:  Vital signs in last 24 hours: Temp:  [97.7 F (36.5 C)-97.8 F (36.6 C)] 97.7 F (36.5 C) (06/14 0935) Pulse Rate:  [48-62] 62 (06/14 0935) Resp:  [11-18] 14 (06/14 0935) BP: (77-166)/(40-78) 122/59 (06/14 0935) SpO2:  [96 %-100 %] 98 % (06/14 0935) Weight:  [70.3 kg (155 lb)-71.2 kg (157 lb)] 71.2 kg (157 lb) (06/14 0356) Physical Exam  Constitutional: She is oriented to person, place, and time. She appears well-developed and well-nourished.  HENT:  Head: Normocephalic and atraumatic.  Eyes: Pupils are equal, round, and reactive to light. EOM are normal.  Neck: Normal range of motion. No thyromegaly present.  Cardiovascular: Normal rate and regular rhythm.  Respiratory: Effort normal. No respiratory distress.  GI: Soft. She exhibits no distension.  Musculoskeletal: Normal range of motion. She exhibits no edema.  Neurological: She is alert and oriented to person, place, and time.  Skin: Skin is warm and dry.  Psychiatric: She has a normal mood and affect. Her behavior is normal. Judgment and thought content normal.    Laboratory Data:  Results for orders placed or performed during the hospital encounter of 03/13/18 (from the past 72 hour(s))  Comprehensive metabolic panel     Status: Abnormal   Collection Time: 03/13/18  9:02 PM  Result Value Ref Range  Sodium 139 135 - 145 mmol/L   Potassium 4.9 3.5 - 5.1 mmol/L   Chloride 104 101 - 111 mmol/L   CO2 24 22 - 32 mmol/L   Glucose, Bld 187 (H) 65 - 99 mg/dL   BUN 27 (H) 6 - 20 mg/dL   Creatinine, Ser 1.76 (H) 0.44 - 1.00 mg/dL   Calcium 9.4 8.9 - 10.3 mg/dL   Total Protein 6.5 6.5 - 8.1 g/dL   Albumin 3.7 3.5 - 5.0 g/dL   AST 35 15 - 41 U/L   ALT 45 14 - 54 U/L   Alkaline Phosphatase 69 38 - 126 U/L   Total Bilirubin 0.4 0.3 - 1.2 mg/dL   GFR  calc non Af Amer 28 (L) >60 mL/min   GFR calc Af Amer 33 (L) >60 mL/min    Comment: (NOTE) The eGFR has been calculated using the CKD EPI equation. This calculation has not been validated in all clinical situations. eGFR's persistently <60 mL/min signify possible Chronic Kidney Disease.    Anion gap 11 5 - 15    Comment: Performed at [facility] [address] [place], [place] [address]  CBC with Differential     Status: Abnormal   Collection Time: 03/13/18  9:02 PM  Result Value Ref Range   WBC 7.2 4.0 - 10.5 K/uL   RBC 3.65 (L) 3.87 - 5.11 MIL/uL   Hemoglobin 11.3 (L) 12.0 - 15.0 g/dL   HCT 36.8 36.0 - 46.0 %   MCV 100.8 (H) 78.0 - 100.0 fL   MCH 31.0 26.0 - 34.0 pg   MCHC 30.7 30.0 - 36.0 g/dL   RDW 12.9 11.5 - 15.5 %   Platelets 294 150 - 400 K/uL   Neutrophils Relative % 55 %   Neutro Abs 4.0 1.7 - 7.7 K/uL   Lymphocytes Relative 30 %   Lymphs Abs 2.1 0.7 - 4.0 K/uL   Monocytes Relative 12 %   Monocytes Absolute 0.8 0.1 - 1.0 K/uL   Eosinophils Relative 3 %   Eosinophils Absolute 0.2 0.0 - 0.7 K/uL   Basophils Relative 1 %   Basophils Absolute 0.0 0.0 - 0.1 K/uL   Immature Granulocytes 0 %   Abs Immature Granulocytes 0.0 0.0 - 0.1 K/uL    Comment: Performed at [facility] [address], [place], [place] [address]  Type and screen [place]     Status: None   Collection Time: 03/13/18  9:13 PM  Result Value Ref Range   ABO/RH(D) A POS    Antibody Screen NEG    Sample Expiration      03/16/2018 Performed at [facility], [place] [address] [place], [place] [address]   ABO/Rh     Status: None   Collection Time: 03/13/18  9:13 PM  Result Value Ref Range   ABO/RH(D)      A POS Performed at [facility] [address] [place], [place] [address]   I-stat troponin, ED     Status: None   Collection Time: 03/13/18  9:17 PM  Result Value Ref Range   Troponin i, poc 0.00 0.00 - 0.08 ng/mL   Comment 3            Comment:  Due to the release kinetics of cTnI, a negative result within the first hours of the onset of symptoms does not rule out myocardial infarction with certainty. If myocardial infarction is still suspected, repeat the  test at appropriate intervals.   I-Stat Chem 8, ED     Status: Abnormal   Collection Time: 03/13/18 10:53 PM  Result Value Ref Range   Sodium 139 135 - 145 mmol/L   Potassium 5.0 3.5 - 5.1 mmol/L   Chloride 109 101 - 111 mmol/L   BUN 28 (H) 6 - 20 mg/dL   Creatinine, Ser 1.50 (H) 0.44 - 1.00 mg/dL   Glucose, Bld 167 (H) 65 - 99 mg/dL   Calcium, Ion 1.21 1.15 - 1.40 mmol/L   TCO2 21 (L) 22 - 32 mmol/L   Hemoglobin 10.5 (L) 12.0 - 15.0 g/dL   HCT 31.0 (L) 36.0 - 46.0 %  I-Stat CG4 Lactic Acid, ED     Status: Abnormal   Collection Time: 03/13/18 10:54 PM  Result Value Ref Range   Lactic Acid, Venous 2.97 (HH) 0.5 - 1.9 mmol/L   Comment NOTIFIED PHYSICIAN   Urinalysis, Routine w reflex microscopic     Status: Abnormal   Collection Time: 03/13/18 11:35 PM  Result Value Ref Range   Color, Urine YELLOW YELLOW   APPearance HAZY (A) CLEAR   Specific Gravity, Urine 1.010 1.005 - 1.030   pH 5.0 5.0 - 8.0   Glucose, UA NEGATIVE NEGATIVE mg/dL   Hgb urine dipstick NEGATIVE NEGATIVE   Bilirubin Urine NEGATIVE NEGATIVE   Ketones, ur NEGATIVE NEGATIVE mg/dL   Protein, ur NEGATIVE NEGATIVE mg/dL   Nitrite NEGATIVE NEGATIVE   Leukocytes, UA MODERATE (A) NEGATIVE   RBC / HPF 0-5 0 - 5 RBC/hpf   WBC, UA 21-50 0 - 5 WBC/hpf   Bacteria, UA RARE (A) NONE SEEN   Squamous Epithelial / LPF 11-20 0 - 5   Mucus PRESENT    Hyaline Casts, UA PRESENT     Comment: Performed at [facility], [address], [place], [place] [address]  TSH     Status: None   Collection Time: 03/13/18 11:50 PM  Result Value Ref Range   TSH 1.258 0.350 - 4.500 uIU/mL    Comment: Performed by a 3rd Generation assay with a functional sensitivity of <=0.01 uIU/mL. Performed at [facility],  [place] [address] [place], [place] [address]   I-Stat CG4 Lactic Acid, ED     Status: None   Collection Time: 03/14/18 12:03 AM  Result Value Ref Range   Lactic Acid, Venous 1.73 0.5 - 1.9 mmol/L  Cortisol-am, blood     Status: Abnormal   Collection Time: 03/14/18  2:52 AM  Result Value Ref Range   Cortisol - AM 4.0 (L) 6.7 - 22.6 ug/dL    Comment: Performed at [facility] [address] [place], [place] [address]  Lactic acid, plasma     Status: None   Collection Time: 03/14/18  2:52 AM  Result Value Ref Range   Lactic Acid, Venous 1.5 0.5 - 1.9 mmol/L    Comment: Performed at [facility] [address], [place], [place] [address]  Procalcitonin     Status: None   Collection Time: 03/14/18  2:52 AM  Result Value Ref Range   Procalcitonin <0.10 ng/mL    Comment:        Interpretation: PCT (Procalcitonin) <= 0.5 ng/mL: Systemic infection (sepsis) is not likely. Local bacterial infection is possible. (NOTE)       Sepsis PCT Algorithm           Lower Respiratory Tract  Infection PCT Algorithm    ----------------------------     ----------------------------         PCT < 0.25 ng/mL                PCT < 0.10 ng/mL         Strongly encourage             Strongly discourage   discontinuation of antibiotics    initiation of antibiotics    ----------------------------     -----------------------------       PCT 0.25 - 0.50 ng/mL            PCT 0.10 - 0.25 ng/mL               OR       >80% decrease in PCT            Discourage initiation of                                            antibiotics      Encourage discontinuation           of antibiotics    ----------------------------     -----------------------------         PCT >= 0.50 ng/mL              PCT 0.26 - 0.50 ng/mL               AND        <80% decrease in PCT             Encourage initiation of                                             antibiotics       Encourage  continuation           of antibiotics    ----------------------------     -----------------------------        PCT >= 0.50 ng/mL                  PCT > 0.50 ng/mL               AND         increase in PCT                  Strongly encourage                                      initiation of antibiotics    Strongly encourage escalation           of antibiotics                                     -----------------------------                                           PCT <= 0.25 ng/mL  OR                                        > 80% decrease in PCT                                     Discontinue / Do not initiate                                             antibiotics Performed at [facility], [place] [address], [place], [place] [address]   Protime-INR     Status: None   Collection Time: 03/14/18  2:52 AM  Result Value Ref Range   Prothrombin Time 12.9 11.4 - 15.2 seconds    Comment: QUESTIONABLE RESULTS, RECOMMEND RECOLLECT TO VERIFY SPECIMEN CLOTTED NEW SAMPLE REQUESTED    INR 0.98     Comment: QUESTIONABLE RESULTS, RECOMMEND RECOLLECT TO VERIFY SPECIMEN CLOTTED NEW SAMPLE REQUESTED Performed at [facility] [address] [place], [place] [address]   HIV antibody (Routine Testing)     Status: None   Collection Time: 03/14/18  2:52 AM  Result Value Ref Range   HIV Screen 4th Generation wRfx Non Reactive Non Reactive    Comment: (NOTE) Performed At: [facility] [address] [place], [place] [id number] Rush Farmer MD QD:[id number] Performed at [facility], [place] [address], [place], [place] [address]   Basic metabolic panel     Status: Abnormal   Collection Time: 03/14/18  2:52 AM  Result Value Ref Range   Sodium 139 135 - 145 mmol/L   Potassium 4.6 3.5 - 5.1 mmol/L   Chloride 109 101 - 111 mmol/L   CO2 21 (L) 22 - 32 mmol/L   Glucose, Bld 126 (H) 65 - 99 mg/dL   BUN 25 (H) 6 - 20 mg/dL    Creatinine, Ser 1.37 (H) 0.44 - 1.00 mg/dL   Calcium 9.2 8.9 - 10.3 mg/dL   GFR calc non Af Amer 38 (L) >60 mL/min   GFR calc Af Amer 44 (L) >60 mL/min    Comment: (NOTE) The eGFR has been calculated using the CKD EPI equation. This calculation has not been validated in all clinical situations. eGFR's persistently <60 mL/min signify possible Chronic Kidney Disease.    Anion gap 9 5 - 15    Comment: Performed at [facility] [address] [place], [place] [address]  CBC     Status: Abnormal   Collection Time: 03/14/18  2:52 AM  Result Value Ref Range   WBC 6.2 4.0 - 10.5 K/uL   RBC 4.00 3.87 - 5.11 MIL/uL   Hemoglobin 12.4 12.0 - 15.0 g/dL   HCT 39.8 36.0 - 46.0 %   MCV 99.5 78.0 - 100.0 fL   MCH 31.0 26.0 - 34.0 pg   MCHC 31.2 30.0 - 36.0 g/dL   RDW 12.8 11.5 - 15.5 %   Platelets 91 (L) 150 - 400 K/uL    Comment: REPEATED TO VERIFY SPECIMEN CHECKED FOR CLOTS PLATELET COUNT CONFIRMED BY SMEAR DELTA CHECK NOTED Performed at [facility], [place] [address] [place], [place] [address]   Glucose, capillary  Status: None   Collection Time: 03/14/18  3:58 AM  Result Value Ref Range   Glucose-Capillary 98 65 - 99 mg/dL  Glucose, capillary     Status: Abnormal   Collection Time: 03/14/18  7:52 AM  Result Value Ref Range   Glucose-Capillary 113 (H) 65 - 99 mg/dL  Protime-INR     Status: None   Collection Time: 03/14/18  7:59 AM  Result Value Ref Range   Prothrombin Time 13.5 11.4 - 15.2 seconds   INR 1.04     Comment: Performed at [facility], [place] [address], [place], [place] [address]  APTT     Status: None   Collection Time: 03/14/18  7:59 AM  Result Value Ref Range   aPTT 28 24 - 36 seconds    Comment: Performed at [facility] [address], [place], [place] [address]  Glucose, capillary     Status: Abnormal   Collection Time: 03/14/18 11:52 AM  Result Value Ref Range   Glucose-Capillary 141 (H) 65 - 99 mg/dL   No results found for this or  any previous visit (from the past 240 hour(s)). Creatinine: Recent Labs    03/13/18 2102 03/13/18 2253 03/14/18 0252  CREATININE 1.76* 1.50* 1.37*   Baseline Creatinine: 1.3  Impression/Assessment:  67yo with left ureteral calculus  Plan:  1. I discussed the management of her distal ureteral calculus including observation, ureteral stent placement, nephrostomy tube placement, and ureteroscopic stone extraction. She is currently asymptomatic from her calculus without hydronephrosis or pyelonephritis. It is unlikely her hypotention was related to the calculus. She has since improved with hydration. We will continue to follow and if her WBC worsens or vitals worsen we will revisit ureteral stent placement  Natalie [name] 03/14/2018, 4:06 PM

## 2018-03-15 DIAGNOSIS — R531 Weakness: Secondary | ICD-10-CM

## 2018-03-15 DIAGNOSIS — E039 Hypothyroidism, unspecified: Secondary | ICD-10-CM

## 2018-03-15 DIAGNOSIS — N179 Acute kidney failure, unspecified: Secondary | ICD-10-CM

## 2018-03-15 DIAGNOSIS — I959 Hypotension, unspecified: Secondary | ICD-10-CM

## 2018-03-15 DIAGNOSIS — N183 Chronic kidney disease, stage 3 (moderate): Secondary | ICD-10-CM

## 2018-03-15 DIAGNOSIS — I10 Essential (primary) hypertension: Secondary | ICD-10-CM

## 2018-03-15 LAB — BASIC METABOLIC PANEL
Anion gap: 9 (ref 5–15)
BUN: 15 mg/dL (ref 6–20)
CO2: 23 mmol/L (ref 22–32)
Calcium: 8.8 mg/dL — ABNORMAL LOW (ref 8.9–10.3)
Chloride: 111 mmol/L (ref 101–111)
Creatinine, Ser: 1.2 mg/dL — ABNORMAL HIGH (ref 0.44–1.00)
GFR calc Af Amer: 52 mL/min — ABNORMAL LOW (ref >60)
GFR, EST NON AFRICAN AMERICAN: 45 mL/min — AB (ref >60)
GLUCOSE: 165 mg/dL — AB (ref 65–99)
POTASSIUM: 3.9 mmol/L (ref 3.5–5.1)
Sodium: 143 mmol/L (ref 135–145)

## 2018-03-15 LAB — GLUCOSE, CAPILLARY
Glucose-Capillary: 181 mg/dL — ABNORMAL HIGH (ref 65–99)
Glucose-Capillary: 175 mg/dL — ABNORMAL HIGH (ref 65–99)

## 2018-03-15 LAB — CBC
HCT: 36.3 % (ref 36.0–46.0)
Hemoglobin: 11.4 g/dL — ABNORMAL LOW (ref 12.0–15.0)
MCH: 30.6 pg (ref 26.0–34.0)
MCHC: 31.4 g/dL (ref 30.0–36.0)
MCV: 97.6 fL (ref 78.0–100.0)
PLATELETS: 243 10*3/uL (ref 150–400)
RBC: 3.72 MIL/uL — AB (ref 3.87–5.11)
RDW: 12.8 % (ref 11.5–15.5)
WBC: 6.2 10*3/uL (ref 4.0–10.5)

## 2018-03-15 LAB — URINE CULTURE: CULTURE: NO GROWTH

## 2018-03-15 NOTE — Discharge Summary (Signed)
Physician Discharge Summary  Natalie [name] GEZ:[id number] DOB: [birth year] DOA: 03/13/2018  PCP: Shawnee Knapp, MD  Admit date: 03/13/2018 Discharge date: 03/15/2018  Time spent: 25* minutes  Recommendations for Outpatient Follow-up:  1. Follow up Urology in 2 weeks  Discharge Diagnoses:  Principal Problem:   Complicated UTI (urinary tract infection) Active Problems:   Type II diabetes mellitus with renal manifestations (HCC)   HTN (hypertension)   Hyperlipemia   Hypothyroidism   Seizure disorder ([place])   Pyelonephritis   Acute renal failure superimposed on stage 3 chronic kidney disease ([place])   Hypotension   Discharge Condition: Stable  Diet recommendation: heart healthy diet  Filed Weights   03/13/18 2058 03/14/18 0356 03/14/18 2026  Weight: 70.3 kg (155 lb) 71.2 kg (157 lb) 70.8 kg (156 lb)    History of present illness:  67 y.o.femalewith medical history significant ofnephrolithiasis, CDK-3, DM, HTN, anemia, hyperlipidemia, hypothyroidism, seizure, anemia, who presents with generalized weakness, hypotension and left flank pain.  Patient states that she has been having generalized weakness and fatigue for more than 2 days, which has worsened today. She checked her blood pressure which is low, at70s/40s.She also reports left flank pain, which is dull, constant, mild andnonradiating. She denies hematuria, dysuria or burning on urination. No increased urinary frequency. Denies fever or chills. No chest pain, shortness of breath, cough. Denies nausea, vomiting, diarrhea or abdominal pain. No unilateral weakness. Her initial blood pressure is 77/40, which improved to 111/50 after giving 2litersofnormal saline bolus in ED.     Hospital Course:   Hypotension -resolved, patient was initially-started on antibiotics for possible sepsis.  Urine culture is negative.  Antibiotics have been discontinued.  She did not meet criteria for sepsis at the time of admission but  lactic acid was elevated to 2.97.  Which improved to 1.5.  Procalcitonin less than 1.0, at this time hypotension has resolved.  Patient is asymptomatic.  Urology has cleared patient for discharge.  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  Urology recommends outpatient follow-up with Dr. Diona Fanti for ongoing surveillance.  Acute on CKD stage III-patient had mild elevation of creatinine 1.76, Cozaar was held.  Today creatinine is 1.20.  Will resume Cozaar  Hypertension-blood pressure is stable at this time, will discharge home on amlodipine and Cozaar.  Diabetes mellitus-blood sugar is controlled, continue glipizide at home.  Hypothyroidism-TSH is 1.258, continue Synthroid.  Procedures:  None   Consultations:  Urology  Discharge Exam: Vitals:   03/15/18 0522 03/15/18 0834  BP: (!) 164/81 129/66  Pulse: 77 68  Resp: 18 16  Temp: 98.3 F (36.8 C) 98.6 F (37 C)  SpO2: 98% 95%    General: Appears in no acute distress Cardiovascular: S1S2 RRR Respiratory: Clear bilaterally  Discharge Instructions   Discharge Instructions    Diet - low sodium heart healthy   Complete by:  As directed    Increase activity slowly   Complete by:  As directed      Allergies as of 03/15/2018      Reactions   Compazine Other (See Comments)   Bad headache, vomiting    Cymbalta [duloxetine Hcl] Other (See Comments)   "Kept me awake"   Methadone Hives   Duloxetine Anxiety   Escitalopram Anxiety   Macrobid [nitrofurantoin Monohyd Macro] Rash   Nitrofuran Derivatives Rash      Medication List    TAKE these medications   amLODipine 5 MG tablet Commonly known as:  NORVASC  Take 1 tablet (5 mg total) by mouth daily. What changed:  when to take this   aspirin-acetaminophen-caffeine 250-250-65 MG tablet Commonly known as:  EXCEDRIN MIGRAINE Take 2 tablets by mouth every 6 (six) hours as needed for headache or migraine.    atorvastatin 20 MG tablet Commonly known as:  LIPITOR Take 1 tablet (20 mg total) by mouth daily. What changed:  when to take this   butorphanol 10 MG/ML nasal spray Commonly known as:  STADOL PLACE 1 SPRAY INTO THE NOSE EVERY 4 HOURS AS NEEDED FOR MIGRAINE.   clobetasol cream 0.05 % Commonly known as:  TEMOVATE APPLY TO AFFECTED AREA ONCE EVERY DAY AS NEEDED   cyanocobalamin 1000 MCG tablet Take 1 tablet (1,000 mcg total) by mouth daily.   estradiol 0.1 MG/GM vaginal cream Commonly known as:  ESTRACE Use 1 fingertip amount to urethra, clitoris, and vaginal introitus every night x 2 weeks, then decreased to 2-3x weekly   glipiZIDE 10 MG tablet Commonly known as:  GLUCOTROL Take 1 tablet (10 mg total) by mouth 2 (two) times daily before a meal. What changed:  how much to take   levothyroxine 75 MCG tablet Commonly known as:  SYNTHROID, LEVOTHROID Take 1 tablet (75 mcg total) by mouth daily. What changed:  when to take this   losartan 50 MG tablet Commonly known as:  COZAAR Take 1 tablet (50 mg total) by mouth daily. What changed:  when to take this   PHENobarbital 97.2 MG tablet Commonly known as:  LUMINAL TAKE 2 TABLETS BY MOUTH ONCE DAILY ON MON,WED,AND FRI AND 1 TABLET DAILY ON OTHER DAYS What changed:    how much to take  how to take this  when to take this  additional instructions   REPLENS Gel Place 1 application vaginally 3 (three) times a week.   tiZANidine 4 MG tablet Commonly known as:  ZANAFLEX TAKE 1 TABLET BY MOUTH EVERY 6 hrs prn MUSCLE SPASMS. AND TAKE EVERY NIGHT BEFORE BED What changed:    how much to take  how to take this  when to take this  reasons to take this  additional instructions   zolpidem 10 MG tablet Commonly known as:  AMBIEN take 1 tablet by mouth at bedtime for sleep if needed. What changed:    how much to take  how to take this  when to take this  additional instructions      Allergies  Allergen  Reactions  . Compazine Other (See Comments)    Bad headache, vomiting   . Cymbalta [Duloxetine Hcl] Other (See Comments)    "Kept me awake"  . Methadone Hives  . Duloxetine Anxiety  . Escitalopram Anxiety  . Macrobid [Nitrofurantoin Monohyd Macro] Rash  . Nitrofuran Derivatives Rash   Follow-up Information    Franchot Gallo, MD. Schedule an appointment as soon as possible for a visit in 2 week(s).   Specialty:  Urology Contact information: [place] [address] [phone]            The results of significant diagnostics from this hospitalization (including imaging, microbiology, ancillary and laboratory) are listed below for reference.    Significant Diagnostic Studies: Ct Abdomen Pelvis Wo Contrast  Result Date: 03/14/2018 CLINICAL DATA:  Flank pain EXAM: CT ABDOMEN AND PELVIS WITHOUT CONTRAST TECHNIQUE: Multidetector CT imaging of the abdomen and pelvis was performed following the standard protocol without IV contrast. COMPARISON:  CT abdomen pelvis 06/22/2016 FINDINGS: LOWER CHEST: No basilar pulmonary nodules  or pleural effusion. No apical pericardial effusion. HEPATOBILIARY: Normal hepatic contours and density. No intra- or extrahepatic biliary dilatation. Status post cholecystectomy. PANCREAS: Normal parenchymal contours without ductal dilatation. No peripancreatic fluid collection. SPLEEN: Normal. ADRENALS/URINARY TRACT: --Adrenal glands: Normal. --Right kidney/ureter: There is pelviectasis. 8 x 6 mm stone at the right lower pole. --Left kidney/ureter: Chronic left renal atrophy with large obstructing stone at the left ureterovesical junction, unchanged, measuring 10 mm. --Urinary bladder: Normal for degree of distention STOMACH/BOWEL: --Stomach/Duodenum: Small hiatal hernia.  Normal duodenal course. --Small bowel: No dilatation or inflammation. --Colon: No focal abnormality. --Appendix: Normal. VASCULAR/LYMPHATIC: Atherosclerotic calcification is present  within the non-aneurysmal abdominal aorta, without hemodynamically significant stenosis. No abdominal or pelvic lymphadenopathy. REPRODUCTIVE: Normal uterus and ovaries. MUSCULOSKELETAL. Multilevel degenerative disc disease and facet arthrosis. No bony spinal canal stenosis. OTHER: None. IMPRESSION: 1. Nonobstructive right-sided nephrolithiasis with mild pelviectasis. 2. Chronic left renal atrophy with large obstructing left ureterovesical junction stone measuring 10 mm, unchanged in position over multiple studies. 3.  Aortic Atherosclerosis (ICD10-I70.0). Electronically Signed   By: Ulyses Jarred M.D.   On: 03/14/2018 00:46   Dg Chest Portable 1 View  Result Date: 03/13/2018 CLINICAL DATA:  Generalized weakness with hypotension 2 days. Right shoulder and upper back pain. EXAM: PORTABLE CHEST 1 VIEW COMPARISON:  04/12/2016 FINDINGS: Patient slightly rotated to the right. Lungs are adequately inflated without focal consolidation or effusion. Cardiomediastinal silhouette and remainder of the exam is unchanged. IMPRESSION: No active disease. Electronically Signed   By: Marin Olp M.D.   On: 03/13/2018 22:07    Microbiology: Recent Results (from the past 240 hour(s))  Blood culture (routine x 2)     Status: None (Preliminary result)   Collection Time: 03/13/18  8:35 PM  Result Value Ref Range Status   Specimen Description BLOOD LEFT HAND  Final   Special Requests   Final    BOTTLES DRAWN AEROBIC AND ANAEROBIC Blood Culture adequate volume   Culture   Final    NO GROWTH 1 DAY Performed at [facility], [place] [address], [place], [place] [address]    Report Status PENDING  Incomplete  Blood culture (routine x 2)     Status: None (Preliminary result)   Collection Time: 03/13/18 10:08 PM  Result Value Ref Range Status   Specimen Description BLOOD RIGHT HAND  Final   Special Requests   Final    BOTTLES DRAWN AEROBIC AND ANAEROBIC Blood Culture adequate volume   Culture   Final    NO GROWTH 1  DAY Performed at [facility], [place] [address], [place], [place] [address]    Report Status PENDING  Incomplete  Urine culture     Status: None   Collection Time: 03/13/18 11:35 PM  Result Value Ref Range Status   Specimen Description URINE, CATHETERIZED  Final   Special Requests NONE  Final   Culture   Final    NO GROWTH Performed at [facility], [address] [place], [place] [address]    Report Status 03/15/2018 FINAL  Final     Labs: Basic Metabolic Panel: Recent Labs  Lab 03/13/18 2102 03/13/18 2253 03/14/18 0252 03/15/18 0524  NA 139 139 139 143  K 4.9 5.0 4.6 3.9  CL 104 109 109 111  CO2 24  --  21* 23  GLUCOSE 187* 167* 126* 165*  BUN 27* 28* 25* 15  CREATININE 1.76* 1.50* 1.37* 1.20*  CALCIUM 9.4  --  9.2 8.8*  Liver Function Tests: Recent Labs  Lab 03/13/18 2102  AST 35  ALT 45  ALKPHOS 69  BILITOT 0.4  PROT 6.5  ALBUMIN 3.7   No results for input(s): LIPASE, AMYLASE in the last 168 hours. No results for input(s): AMMONIA in the last 168 hours. CBC: Recent Labs  Lab 03/13/18 2102 03/13/18 2253 03/14/18 0252 03/15/18 0524  WBC 7.2  --  6.2 6.2  NEUTROABS 4.0  --   --   --   HGB 11.3* 10.5* 12.4 11.4*  HCT 36.8 31.0* 39.8 36.3  MCV 100.8*  --  99.5 97.6  PLT 294  --  91* 243    CBG: Recent Labs  Lab 03/14/18 1629 03/14/18 1811 03/14/18 2113 03/15/18 0829 03/15/18 1137  GLUCAP 187* 145* 152* 181* 175*       Signed:  Oswald Hillock MD.  [facility] 03/15/2018, 2:58 PM

## 2018-03-15 NOTE — Progress Notes (Signed)
Patient ID: Natalie [name], female   DOB: 20-Nov-1950, 67 y.o.   MRN: [id number]    Subjective: Hypotension resolved with IVF hydration.  Denies pain.  No fever.  Objective: Vital signs in last 24 hours: Temp:  [98.3 F (36.8 C)-99.1 F (37.3 C)] 98.6 F (37 C) (06/15 0834) Pulse Rate:  [57-78] 68 (06/15 0834) Resp:  [14-18] 16 (06/15 0834) BP: (129-164)/(61-81) 129/66 (06/15 0834) SpO2:  [95 %-98 %] 95 % (06/15 0834) Weight:  [70.8 kg (156 lb)] 70.8 kg (156 lb) (06/14 2026)  Intake/Output from previous day: 06/14 0701 - 06/15 0700 In: 1464.2 [I.V.:1187.5; IV Piggyback:276.7] Out: 0  Intake/Output this shift: Total I/O In: 240 [P.O.:240] Out: -   Physical Exam:  General: Alert and oriented Abd: No CVAT  Lab Results: Recent Labs    03/13/18 2253 03/14/18 0252 03/15/18 0524  HGB 10.5* 12.4 11.4*  HCT 31.0* 39.8 36.3   CBC Latest Ref Rng & Units 03/15/2018 03/14/2018 03/13/2018  WBC 4.0 - 10.5 K/uL 6.2 6.2 -  Hemoglobin 12.0 - 15.0 g/dL 11.4(L) 12.4 10.5(L)  Hematocrit 36.0 - 46.0 % 36.3 39.8 31.0(L)  Platelets 150 - 400 K/uL 243 91(L) -     BMET Recent Labs    03/14/18 0252 03/15/18 0524  NA 139 143  K 4.6 3.9  CL 109 111  CO2 21* 23  GLUCOSE 126* 165*  BUN 25* 15  CREATININE 1.37* 1.20*  CALCIUM 9.2 8.8*     Studies/Results: Ct Abdomen Pelvis Wo Contrast  Result Date: 03/14/2018 CLINICAL DATA:  Flank pain EXAM: CT ABDOMEN AND PELVIS WITHOUT CONTRAST TECHNIQUE: Multidetector CT imaging of the abdomen and pelvis was performed following the standard protocol without IV contrast. COMPARISON:  CT abdomen pelvis 06/22/2016 FINDINGS: LOWER CHEST: No basilar pulmonary nodules or pleural effusion. No apical pericardial effusion. HEPATOBILIARY: Normal hepatic contours and density. No intra- or extrahepatic biliary dilatation. Status post cholecystectomy. PANCREAS: Normal parenchymal contours without ductal dilatation. No peripancreatic fluid collection. SPLEEN: Normal.  ADRENALS/URINARY TRACT: --Adrenal glands: Normal. --Right kidney/ureter: There is pelviectasis. 8 x 6 mm stone at the right lower pole. --Left kidney/ureter: Chronic left renal atrophy with large obstructing stone at the left ureterovesical junction, unchanged, measuring 10 mm. --Urinary bladder: Normal for degree of distention STOMACH/BOWEL: --Stomach/Duodenum: Small hiatal hernia.  Normal duodenal course. --Small bowel: No dilatation or inflammation. --Colon: No focal abnormality. --Appendix: Normal. VASCULAR/LYMPHATIC: Atherosclerotic calcification is present within the non-aneurysmal abdominal aorta, without hemodynamically significant stenosis. No abdominal or pelvic lymphadenopathy. REPRODUCTIVE: Normal uterus and ovaries. MUSCULOSKELETAL. Multilevel degenerative disc disease and facet arthrosis. No bony spinal canal stenosis. OTHER: None. IMPRESSION: 1. Nonobstructive right-sided nephrolithiasis with mild pelviectasis. 2. Chronic left renal atrophy with large obstructing left ureterovesical junction stone measuring 10 mm, unchanged in position over multiple studies. 3.  Aortic Atherosclerosis (ICD10-I70.0). Electronically Signed   By: Ulyses Jarred M.D.   On: 03/14/2018 00:46   Dg Chest Portable 1 View  Result Date: 03/13/2018 CLINICAL DATA:  Generalized weakness with hypotension 2 days. Right shoulder and upper back pain. EXAM: PORTABLE CHEST 1 VIEW COMPARISON:  04/12/2016 FINDINGS: Patient slightly rotated to the right. Lungs are adequately inflated without focal consolidation or effusion. Cardiomediastinal silhouette and remainder of the exam is unchanged. IMPRESSION: No active disease. Electronically Signed   By: Marin Olp M.D.   On: 03/13/2018 22:07    Assessment/Plan: 1) Chronic distal left ureteral stone with non-functional left renal unit:  She remains asymptomatic from her long standing ureteral stone and in  the absence of pain or signs of infection, she would not benefit from  intervention.  She should f/u with Dr. Diona Fanti for further ongoing surveillance.   LOS: 1 day   Fleur Audino,LES 03/15/2018, 1:24 PM

## 2018-03-15 NOTE — Progress Notes (Signed)
Pt. discharged to home. Pt after visit summary reviewed and pt capable of re verbalizing medications and follow up appointments. Pt remains stable. No signs and symptoms of distress. Educated to return to ER in the event of SOB, dizziness, chest pain, or fainting. Zymiere Trostle, RN  

## 2018-03-16 DIAGNOSIS — Z79899 Other long term (current) drug therapy: Secondary | ICD-10-CM | POA: Insufficient documentation

## 2018-03-16 DIAGNOSIS — G894 Chronic pain syndrome: Secondary | ICD-10-CM | POA: Insufficient documentation

## 2018-03-16 DIAGNOSIS — Z1211 Encounter for screening for malignant neoplasm of colon: Secondary | ICD-10-CM | POA: Insufficient documentation

## 2018-03-19 ENCOUNTER — Encounter: Payer: Self-pay | Admitting: Family Medicine

## 2018-03-19 ENCOUNTER — Ambulatory Visit (INDEPENDENT_AMBULATORY_CARE_PROVIDER_SITE_OTHER): Payer: Medicare Other | Admitting: Family Medicine

## 2018-03-19 ENCOUNTER — Other Ambulatory Visit: Payer: Self-pay

## 2018-03-19 VITALS — BP 128/78 | HR 68 | Temp 97.8°F | Resp 16 | Ht <= 58 in | Wt 158.0 lb

## 2018-03-19 DIAGNOSIS — N179 Acute kidney failure, unspecified: Secondary | ICD-10-CM

## 2018-03-19 DIAGNOSIS — N200 Calculus of kidney: Secondary | ICD-10-CM

## 2018-03-19 DIAGNOSIS — N390 Urinary tract infection, site not specified: Secondary | ICD-10-CM | POA: Diagnosis not present

## 2018-03-19 DIAGNOSIS — I10 Essential (primary) hypertension: Secondary | ICD-10-CM

## 2018-03-19 DIAGNOSIS — G43711 Chronic migraine without aura, intractable, with status migrainosus: Secondary | ICD-10-CM | POA: Diagnosis not present

## 2018-03-19 DIAGNOSIS — N12 Tubulo-interstitial nephritis, not specified as acute or chronic: Secondary | ICD-10-CM | POA: Diagnosis not present

## 2018-03-19 DIAGNOSIS — N183 Chronic kidney disease, stage 3 unspecified: Secondary | ICD-10-CM

## 2018-03-19 LAB — POC MICROSCOPIC URINALYSIS (UMFC): Mucus: ABSENT

## 2018-03-19 LAB — CULTURE, BLOOD (ROUTINE X 2)
CULTURE: NO GROWTH
Culture: NO GROWTH
Special Requests: ADEQUATE
Special Requests: ADEQUATE

## 2018-03-19 LAB — POCT URINALYSIS DIP (MANUAL ENTRY)
Blood, UA: NEGATIVE
GLUCOSE UA: NEGATIVE mg/dL
Ketones, POC UA: NEGATIVE mg/dL
NITRITE UA: NEGATIVE
Protein Ur, POC: 30 mg/dL — AB
Spec Grav, UA: 1.025 (ref 1.010–1.025)
UROBILINOGEN UA: 1 U/dL
pH, UA: 5.5 (ref 5.0–8.0)

## 2018-03-19 LAB — POCT CBC
Granulocyte percent: 69.1 %G (ref 37–80)
HCT, POC: 37.2 % — AB (ref 37.7–47.9)
HEMOGLOBIN: 12.3 g/dL (ref 12.2–16.2)
LYMPH, POC: 1.8 (ref 0.6–3.4)
MCH: 30.6 pg (ref 27–31.2)
MCHC: 33.1 g/dL (ref 31.8–35.4)
MCV: 92.5 fL (ref 80–97)
MID (cbc): 0.3 (ref 0–0.9)
MPV: 8.1 fL (ref 0–99.8)
POC Granulocyte: 4.6 (ref 2–6.9)
POC LYMPH PERCENT: 26.8 %L (ref 10–50)
POC MID %: 4.1 % (ref 0–12)
Platelet Count, POC: 251 10*3/uL (ref 142–424)
RBC: 4.03 M/uL — AB (ref 4.04–5.48)
RDW, POC: 14.3 %
WBC: 6.7 10*3/uL (ref 4.6–10.2)

## 2018-03-19 MED ORDER — BUTORPHANOL TARTRATE 10 MG/ML NA SOLN: NASAL | 5 refills | Status: DC

## 2018-03-19 NOTE — Progress Notes (Signed)
Subjective:  By signing my name below, I, Natalie [name], attest that this documentation has been prepared under the direction and in the presence of Delman Cheadle, MD Electronically Signed: Ladene Artist, ED Scribe 03/19/2018 at 2:01 PM.   Patient ID: Natalie [name], female    DOB: [birth year], 67 y.o.   MRN: [id number]  Chief Complaint  Patient presents with  . Medication Refill    stadol, discuss bp meds,    HPI Natalie [name] is a 67 y.o. female who presents to Primary Care at [facility] for med refills.   Pyelonephritis Pt was hospitalized 6/13-15 for pyelonephritis with h/o obstructive and recurrent nephrolithiasis. Presented hypotensive with BP 70s/40s. Resolved with fluids and antibiotics. Neg urine culture. Lactic acid was elevated. Procalcitonin, normal. Asymptomatic for kidney stones. Creatinine elevated 1.76, decreased to 1.2.  Recommended to f/u with urology Dr. Diona Fanti in 2 wks.- Pt has an appointment scheduled with Dr. Diona Fanti for 7/2.    States she was feeling fatigue prior to admission but feels fine at this time. Reports that she had been drinking plenty of fluids.   HTN  Cozaar held during admission, restarted upon discharge along with amlodipine as BP increased to 160/80.  No med doses were altered. BP is checked at least twice/wk at home, normal. Denies lightheadedness, dizziness, cp, sob.  DM Reports her blood glucose was 188 that day.   Past Medical History:  Diagnosis Date  . Chronic kidney disease    kidney stones, right and left  . Diabetes mellitus without complication ([place])   . Headache    migraines  . Hyperlipidemia   . Hypertension   . Iron deficiency anemia   . Renal stone   . Seizures ([place])    none x 30 yrs   Current Outpatient Medications on File Prior to Visit  Medication Sig Dispense Refill  . amLODipine (NORVASC) 5 MG tablet Take 1 tablet (5 mg total) by mouth daily. (Patient taking differently: Take 5 mg by mouth daily with supper. ) 90 tablet 3  .  aspirin-acetaminophen-caffeine (EXCEDRIN MIGRAINE) 250-250-65 MG tablet Take 2 tablets by mouth every 6 (six) hours as needed for headache or migraine.    Marland Kitchen atorvastatin (LIPITOR) 20 MG tablet Take 1 tablet (20 mg total) by mouth daily. (Patient taking differently: Take 20 mg by mouth daily with supper. ) 90 tablet 3  . butorphanol (STADOL) 10 MG/ML nasal spray PLACE 1 SPRAY INTO THE NOSE EVERY 4 HOURS AS NEEDED FOR MIGRAINE. 2.5 mL 5  . clobetasol cream (TEMOVATE) 0.05 % APPLY TO AFFECTED AREA ONCE EVERY DAY AS NEEDED (Patient not taking: Reported on 03/13/2018) 60 g 0  . estradiol (ESTRACE) 0.1 MG/GM vaginal cream Use 1 fingertip amount to urethra, clitoris, and vaginal introitus every night x 2 weeks, then decreased to 2-3x weekly 42.5 g 5  . glipiZIDE (GLUCOTROL) 10 MG tablet Take 1 tablet (10 mg total) by mouth 2 (two) times daily before a meal. (Patient taking differently: Take 20 mg by mouth 2 (two) times daily before a meal. ) 180 tablet 1  . levothyroxine (SYNTHROID, LEVOTHROID) 75 MCG tablet Take 1 tablet (75 mcg total) by mouth daily. (Patient taking differently: Take 75 mcg by mouth daily with supper. ) 90 tablet 1  . losartan (COZAAR) 50 MG tablet Take 1 tablet (50 mg total) by mouth daily. (Patient taking differently: Take 50 mg by mouth daily with supper. ) 90 tablet 3  . PHENobarbital (LUMINAL) 97.2 MG tablet TAKE 2 TABLETS BY  MOUTH ONCE DAILY ON MON,WED,AND FRI AND 1 TABLET DAILY ON OTHER DAYS (Patient taking differently: Take 97.2-194.4 mg by mouth See admin instructions. Take 194.4 mg by mouth with supper on Mon/Wed/Fri and 97.2 mg on Sun/Tues/Thurs/Sat) 135 tablet 1  . tiZANidine (ZANAFLEX) 4 MG tablet TAKE 1 TABLET BY MOUTH EVERY 6 hrs prn MUSCLE SPASMS. AND TAKE EVERY NIGHT BEFORE BED (Patient taking differently: Take 4 mg by mouth every 6 (six) hours as needed for muscle spasms. ) 120 tablet 1  . Vaginal Lubricant (REPLENS) GEL Place 1 application vaginally 3 (three) times a week. 35  g 11  . vitamin B-12 1000 MCG tablet Take 1 tablet (1,000 mcg total) by mouth daily. 30 tablet 0  . zolpidem (AMBIEN) 10 MG tablet take 1 tablet by mouth at bedtime for sleep if needed. (Patient taking differently: Take 10 mg by mouth at bedtime. ) 90 tablet 1   No current facility-administered medications on file prior to visit.    Past Surgical History:  Procedure Laterality Date  . CESAREAN SECTION    . CHOLECYSTECTOMY    . KIDNEY STONE SURGERY    . NEPHROLITHOTOMY Right 05/14/2016   Procedure: NEPHROLITHOTOMY PERCUTANEOUS;  Surgeon: Franchot Gallo, MD;  Location: [facility];  Service: Urology;  Laterality: Right;  . SHOULDER ARTHROSCOPY Bilateral    Allergies  Allergen Reactions  . Compazine Other (See Comments)    Bad headache, vomiting   . Cymbalta [Duloxetine Hcl] Other (See Comments)    "Kept me awake"  . Methadone Hives  . Duloxetine Anxiety  . Escitalopram Anxiety  . Macrobid [Nitrofurantoin Monohyd Macro] Rash  . Nitrofuran Derivatives Rash   Family History  Problem Relation Age of Onset  . Heart disease Father   . Diabetes Father    Social History   Socioeconomic History  . Marital status: Married    Spouse name: Not on file  . Number of children: Not on file  . Years of education: Not on file  . Highest education level: Not on file  Occupational History  . Not on file  Social Needs  . Financial resource strain: Not on file  . Food insecurity:    Worry: Not on file    Inability: Not on file  . Transportation needs:    Medical: Not on file    Non-medical: Not on file  Tobacco Use  . Smoking status: Never Smoker  . Smokeless tobacco: Never Used  Substance and Sexual Activity  . Alcohol use: No    Alcohol/week: 0.0 oz  . Drug use: No  . Sexual activity: Not on file  Lifestyle  . Physical activity:    Days per week: Not on file    Minutes per session: Not on file  . Stress: Not on file  Relationships  . Social connections:    Talks on phone: Not  on file    Gets together: Not on file    Attends religious service: Not on file    Active member of club or organization: Not on file    Attends meetings of clubs or organizations: Not on file    Relationship status: Not on file  Other Topics Concern  . Not on file  Social History Narrative  . Not on file   Depression screen [facility] 2/9 03/19/2018 03/03/2018 02/08/2018 11/23/2017 07/29/2017  Decreased Interest 0 0 0 0 0  Down, Depressed, Hopeless 0 0 0 0 0  PHQ - 2 Score 0 0 0 0 0  Review of Systems  Constitutional: Negative for fatigue.  Respiratory: Negative for shortness of breath.   Cardiovascular: Negative for chest pain.  Neurological: Negative for dizziness and light-headedness.      Objective:   Physical Exam  Constitutional: She is oriented to person, place, and time. She appears well-developed and well-nourished. No distress.  HENT:  Head: Normocephalic and atraumatic.  Eyes: Conjunctivae and EOM are normal.  Neck: Neck supple. No tracheal deviation present.  Cardiovascular: Normal rate.  Pulmonary/Chest: Effort normal. No respiratory distress.  Musculoskeletal: Normal range of motion.  Neurological: She is alert and oriented to person, place, and time.  Skin: Skin is warm and dry.  Psychiatric: She has a normal mood and affect. Her behavior is normal.  Nursing note and vitals reviewed.  BP 128/78   Pulse 68   Temp 97.8 F (36.6 C)   Resp 16   Ht 4\' 8"  (1.422 m)   Wt 158 lb (71.7 kg)   SpO2 97%   BMI 35.42 kg/m    Results for orders placed or performed in visit on 03/19/18  POCT urinalysis dipstick  Result Value Ref Range   Color, UA yellow yellow   Clarity, UA clear clear   Glucose, UA negative negative mg/dL   Bilirubin, UA small (A) negative   Ketones, POC UA negative negative mg/dL   Spec Grav, UA 1.025 1.010 - 1.025   Blood, UA negative negative   pH, UA 5.5 5.0 - 8.0   Protein Ur, POC =30 (A) negative mg/dL   Urobilinogen, UA 1.0 0.2 or 1.0  E.U./dL   Nitrite, UA Negative Negative   Leukocytes, UA Trace (A) Negative  POCT Microscopic Urinalysis ([facility])  Result Value Ref Range   WBC,UR,HPF,POC Moderate (A) None WBC/hpf   RBC,UR,HPF,POC None None RBC/hpf   Bacteria Moderate (A) None, Too numerous to count   Mucus Absent Absent   Epithelial Cells, UR Per Microscopy Moderate (A) None, Too numerous to count cells/hpf  POCT CBC  Result Value Ref Range   WBC 6.7 4.6 - 10.2 K/uL   Lymph, poc 1.8 0.6 - 3.4   POC LYMPH PERCENT 26.8 10 - 50 %L   MID (cbc) 0.3 0 - 0.9   POC MID % 4.1 0 - 12 %M   POC Granulocyte 4.6 2 - 6.9   Granulocyte percent 69.1 37 - 80 %G   RBC 4.03 (A) 4.04 - 5.48 M/uL   Hemoglobin 12.3 12.2 - 16.2 g/dL   HCT, POC 37.2 (A) 37.7 - 47.9 %   MCV 92.5 80 - 97 fL   MCH, POC 30.6 27 - 31.2 pg   MCHC 33.1 31.8 - 35.4 g/dL   RDW, POC 14.3 %   Platelet Count, POC 251 142 - 424 K/uL   MPV 8.1 0 - 99.8 fL   Orthostatic VS for the past 24 hrs:  BP- Lying Pulse- Lying BP- Sitting Pulse- Sitting BP- Standing at 0 minutes Pulse- Standing at 0 minutes  03/19/18 1436 131/77 68 145/84 71 143/83 78      Assessment & Plan:   1. Recurrent UTI - Dr. Alan Ripper note from 03/31/18 scanned into chart  2. Essential hypertension   3. Acute renal failure superimposed on stage 3 chronic kidney disease, unspecified acute renal failure type ([place])   4. Migraine, chronic, without aura, intractable, with status migrainosus - refilled chronic stadol  5.      Renal stone - Seeing Dr. Diona Fanti 03/31/18 - 63mm distal left ureteral stone -  in ureterovesical junction - that he does not think will be able to pass so planning on ureteroscopy and laser trx. 6.      Stage 3 chronic kidney disease ([place]) 7.       Pyelonephritis   Orders Placed This Encounter  Procedures  . Urine Culture  . Comprehensive metabolic panel  . POCT urinalysis dipstick  . POCT Microscopic Urinalysis ([facility])  . POCT CBC    Meds ordered this encounter    Medications  . butorphanol (STADOL) 10 MG/ML nasal spray    Sig: PLACE 1 SPRAY INTO THE NOSE EVERY 4 HOURS AS NEEDED FOR MIGRAINE.    Dispense:  2.5 mL    Refill:  5    I personally performed the services described in this documentation, which was scribed in my presence. The recorded information has been reviewed and considered, and addended by me as needed.   Delman Cheadle, M.D.  Primary Care at [facility] [address] [place], [place] [address] [phone] phone [phone] fax  04/04/18 7:05 PM

## 2018-03-19 NOTE — Patient Instructions (Addendum)
   IF you received an x-ray today, you will receive an invoice from [facility]. Please contact [facility] at [phone] with questions or concerns regarding your invoice.   IF you received labwork today, you will receive an invoice from [facility]. Please contact [facility] at [phone] with questions or concerns regarding your invoice.   Our billing staff will not be able to assist you with questions regarding bills from these companies.  You will be contacted with the lab results as soon as they are available. The fastest way to get your results is to activate your My Chart account. Instructions are located on the last page of this paperwork. If you have not heard from us regarding the results in 2 weeks, please contact this office.     Hypotension Hypotension, commonly called low blood pressure, is when the force of blood pumping through your arteries is too weak. Arteries are blood vessels that carry blood from the heart throughout the body. When blood pressure is too low, you may not get enough blood to your brain or to the rest of your organs. This can cause weakness, light-headedness, rapid heartbeat, and fainting. Depending on the cause and severity, hypotension may be harmless (benign) or cause serious problems (critical). What are the causes? Possible causes of hypotension include:  Blood loss.  Loss of body fluids (dehydration).  Heart problems.  Hormone (endocrine) problems.  Pregnancy.  Severe infection.  Lack of certain nutrients.  Severe allergic reactions (anaphylaxis).  Certain medicines, such as blood pressure medicine or medicines that make the body lose excess fluids (diuretics). Sometimes, hypotension can be caused by not taking medicine as directed, such as taking too much of a certain medicine.  What increases the risk? Certain factors can make you more likely to develop hypotension, including:  Age. Risk increases as you get  older.  Conditions that affect the heart or the central nervous system.  Taking certain medicines, such as blood pressure medicine or diuretics.  Being pregnant.  What are the signs or symptoms? Symptoms of this condition may include:  Weakness.  Light-headedness.  Dizziness.  Blurred vision.  Fatigue.  Rapid heartbeat.  Fainting, in severe cases.  How is this diagnosed? This condition is diagnosed based on:  Your medical history.  Your symptoms.  Your blood pressure measurement. Your health care provider will check your blood pressure when you are: ? Lying down. ? Sitting. ? Standing.  A blood pressure reading is recorded as two numbers, such as "120 over 80" (or 120/80). The first ("top") number is called the systolic pressure. It is a measure of the pressure in your arteries as your heart beats. The second ("bottom") number is called the diastolic pressure. It is a measure of the pressure in your arteries when your heart relaxes between beats. Blood pressure is measured in a unit called mm Hg. Healthy blood pressure for adults is 120/80. If your blood pressure is below 90/60, you may be diagnosed with hypotension. Other information or tests that may be used to diagnose hypotension include:  Your other vital signs, such as your heart rate and temperature.  Blood tests.  Tilt table test. For this test, you will be safely secured to a table that moves you from a lying position to an upright position. Your heart rhythm and blood pressure will be monitored during the test.  How is this treated? Treatment for this condition may include:  Changing your diet. This may involve eating more salt (sodium) or   drinking more water.  Taking medicines to raise your blood pressure.  Changing the dosage of certain medicines you are taking that might be lowering your blood pressure.  Wearing compression stockings. These stockings help to prevent blood clots and reduce swelling  in your legs.  In some cases, you may need to go to the hospital for:  Fluid replacement. This means you will receive fluids through an IV tube.  Blood replacement. This means you will receive donated blood through an IV tube (transfusion).  Treating an infection or heart problems, if this applies.  Monitoring. You may need to be monitored while medicines that you are taking wear off.  Follow these instructions at home: Eating and drinking   Drink enough fluid to keep your urine clear or pale yellow.  Eat a healthy diet and follow instructions from your health care provider about eating or drinking restrictions. A healthy diet includes: ? Fresh fruits and vegetables. ? Whole grains. ? Lean meats. ? Low-fat dairy products.  Eat extra salt only as directed. Do not add extra salt to your diet unless your health care provider told you to do that.  Eat frequent, small meals.  Avoid standing up suddenly after eating. Medicines  Take over-the-counter and prescription medicines only as told by your health care provider. ? Follow instructions from your health care provider about changing the dosage of your current medicines, if this applies. ? Do not stop or adjust any of your medicines on your own. General instructions  Wear compression stockings as told by your health care provider.  Get up slowly from lying down or sitting positions. This gives your blood pressure a chance to adjust.  Avoid hot showers and excessive heat as directed by your health care provider.  Return to your normal activities as told by your health care provider. Ask your health care provider what activities are safe for you.  Do not use any products that contain nicotine or tobacco, such as cigarettes and e-cigarettes. If you need help quitting, ask your health care provider.  Keep all follow-up visits as told by your health care provider. This is important. Contact a health care provider if:  You  vomit.  You have diarrhea.  You have a fever for more than 2-3 days.  You feel more thirsty than usual.  You feel weak and tired. Get help right away if:  You have chest pain.  You have a fast or irregular heartbeat.  You develop numbness in any part of your body.  You cannot move your arms or your legs.  You have trouble speaking.  You become sweaty or feel light-headed.  You faint.  You feel short of breath.  You have trouble staying awake.  You feel confused. This information is not intended to replace advice given to you by your health care provider. Make sure you discuss any questions you have with your health care provider. Document Released: 09/17/2005 Document Revised: 04/06/2016 Document Reviewed: 03/08/2016 Elsevier Interactive Patient Education  2018 [employer]  

## 2018-03-20 LAB — COMPREHENSIVE METABOLIC PANEL
A/G RATIO: 1.8 (ref 1.2–2.2)
ALBUMIN: 4.4 g/dL (ref 3.6–4.8)
ALK PHOS: 89 IU/L (ref 39–117)
ALT: 51 IU/L — ABNORMAL HIGH (ref 0–32)
AST: 42 IU/L — ABNORMAL HIGH (ref 0–40)
BILIRUBIN TOTAL: 0.2 mg/dL (ref 0.0–1.2)
BUN / CREAT RATIO: 21 (ref 12–28)
BUN: 25 mg/dL (ref 8–27)
CHLORIDE: 104 mmol/L (ref 96–106)
CO2: 24 mmol/L (ref 20–29)
Calcium: 9.9 mg/dL (ref 8.7–10.3)
Creatinine, Ser: 1.19 mg/dL — ABNORMAL HIGH (ref 0.57–1.00)
GFR calc non Af Amer: 47 mL/min/{1.73_m2} — ABNORMAL LOW (ref >59)
GFR, EST AFRICAN AMERICAN: 54 mL/min/{1.73_m2} — AB (ref >59)
GLUCOSE: 160 mg/dL — AB (ref 65–99)
Globulin, Total: 2.4 g/dL (ref 1.5–4.5)
Potassium: 4.9 mmol/L (ref 3.5–5.2)
Sodium: 143 mmol/L (ref 134–144)
TOTAL PROTEIN: 6.8 g/dL (ref 6.0–8.5)

## 2018-03-21 LAB — URINE CULTURE

## 2018-03-29 ENCOUNTER — Ambulatory Visit: Payer: Medicare Other | Admitting: Family Medicine

## 2018-03-31 DIAGNOSIS — N201 Calculus of ureter: Secondary | ICD-10-CM | POA: Diagnosis not present

## 2018-04-01 ENCOUNTER — Other Ambulatory Visit: Payer: Self-pay | Admitting: Urology

## 2018-04-02 DIAGNOSIS — Z9889 Other specified postprocedural states: Secondary | ICD-10-CM | POA: Diagnosis not present

## 2018-04-04 ENCOUNTER — Encounter: Payer: Self-pay | Admitting: Family Medicine

## 2018-04-07 ENCOUNTER — Encounter: Payer: Self-pay | Admitting: Physician Assistant

## 2018-04-08 ENCOUNTER — Encounter (HOSPITAL_BASED_OUTPATIENT_CLINIC_OR_DEPARTMENT_OTHER): Payer: Self-pay | Admitting: *Deleted

## 2018-04-08 ENCOUNTER — Encounter: Payer: Self-pay | Admitting: Physician Assistant

## 2018-04-08 ENCOUNTER — Other Ambulatory Visit: Payer: Self-pay

## 2018-04-08 ENCOUNTER — Ambulatory Visit (INDEPENDENT_AMBULATORY_CARE_PROVIDER_SITE_OTHER): Payer: Medicare Other | Admitting: Physician Assistant

## 2018-04-08 VITALS — BP 120/60 | HR 92 | Temp 99.2°F | Resp 18 | Ht <= 58 in | Wt 152.6 lb

## 2018-04-08 DIAGNOSIS — N200 Calculus of kidney: Secondary | ICD-10-CM

## 2018-04-08 DIAGNOSIS — M25511 Pain in right shoulder: Secondary | ICD-10-CM | POA: Diagnosis not present

## 2018-04-08 DIAGNOSIS — G8918 Other acute postprocedural pain: Secondary | ICD-10-CM | POA: Diagnosis not present

## 2018-04-08 MED ORDER — TIZANIDINE HCL 4 MG PO TABS
ORAL_TABLET | ORAL | 0 refills | Status: DC
Start: 2018-04-08 — End: 2018-04-17

## 2018-04-08 NOTE — Patient Instructions (Signed)
     IF you received an x-ray today, you will receive an invoice from [facility]. Please contact [facility] at [phone] with questions or concerns regarding your invoice.   IF you received labwork today, you will receive an invoice from [facility]. Please contact [facility] at [phone] with questions or concerns regarding your invoice.   Our billing staff will not be able to assist you with questions regarding bills from these companies.  You will be contacted with the lab results as soon as they are available. The fastest way to get your results is to activate your My Chart account. Instructions are located on the last page of this paperwork. If you have not heard from us regarding the results in 2 weeks, please contact this office.     

## 2018-04-08 NOTE — Progress Notes (Signed)
Natalie [name]  MRN: [id number] DOB: Feb 09, 1951  PCP: Shawnee Knapp, MD  Chief Complaint  Patient presents with  . Medication Problem    would like to discuss pain meds for surgery     Subjective:  Pt presents to clinic for discussion of pain management after her  stone removal and stent placement in 2 days with Dr Mikey Bussing.  She knows she has a pain contract and is unsure what she is supposed to do in regards to pain control after surgery.  Patient is concerned with stable will control her pain after her surgery and she has tried Norco and Percocet in the past without much pain control.  Called and spoke with Anderson Malta at [facility]   He was unaware of patient using weekly Stadol.  Stated Dr. Diona Fanti typically used Ultram, Norco or Percocet for pain control.  She will need pain control for approximately a week due to having to go into the kidney to retrieve her stone.  6 more weeks for her shoulder surgery for healing per Dr Tamera Punt and she is hoping to get more muscle relaxer today as it is helping with her shoulder pain.  History is obtained by patient.  Review of Systems  Patient Active Problem List   Diagnosis Date Noted  . Chronic pain syndrome 03/16/2018  . Encounter for long-term (current) use of high-risk medication 03/16/2018  . Complicated UTI (urinary tract infection) 03/14/2018  . Acute renal failure superimposed on stage 3 chronic kidney disease ([place]) 03/14/2018  . Hypotension 03/14/2018  . Generalized weakness   . Chronic kidney disease 09/06/2016  . Anemia, iron deficiency 06/23/2016  . Vitamin B12 deficiency (dietary) anemia 06/23/2016  . Migraine, chronic, without aura, intractable, with status migrainosus 01/04/2016  . Abdominal pain   . Pyelonephritis 08/31/2014  . 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 Renal stone 05/14/2013  . BMI 33.0-33.9,adult 03/30/2013  . Recurrent UTI 09/17/2012  . Type II  diabetes mellitus with renal manifestations ([place]) 03/19/2012  . HTN (hypertension) 03/19/2012  . Hyperlipemia 03/19/2012  . Hypothyroidism 03/19/2012  . Seizure disorder ([place]) 03/19/2012  . Insomnia 03/19/2012  . Migraine 03/19/2012    Current Outpatient Medications on File Prior to Visit  Medication Sig Dispense Refill  . amLODipine (NORVASC) 5 MG tablet Take 1 tablet (5 mg total) by mouth daily. (Patient taking differently: Take 5 mg by mouth daily with supper. ) 90 tablet 3  . aspirin-acetaminophen-caffeine (EXCEDRIN MIGRAINE) 250-250-65 MG tablet Take 2 tablets by mouth every 6 (six) hours as needed for headache or migraine.     Marland Kitchen atorvastatin (LIPITOR) 20 MG tablet Take 1 tablet (20 mg total) by mouth daily. (Patient taking differently: Take 20 mg by mouth daily with supper. ) 90 tablet 3  . butorphanol (STADOL) 10 MG/ML nasal spray PLACE 1 SPRAY INTO THE NOSE EVERY 4 HOURS AS NEEDED FOR MIGRAINE. (Patient taking differently: Place 1 spray into the nose every 4 (four) hours as needed. PLACE 1 SPRAY INTO THE NOSE EVERY 4 HOURS AS NEEDED FOR MIGRAINE.) 2.5 mL 5  . estradiol (ESTRACE) 0.1 MG/GM vaginal cream Use 1 fingertip amount to urethra, clitoris, and vaginal introitus every night x 2 weeks, then decreased to 2-3x weekly 42.5 g 5  . glipiZIDE (GLUCOTROL) 10 MG tablet Take 1 tablet (10 mg total) by mouth 2 (two) times daily before a meal. (Patient taking differently: Take 20 mg by mouth 2 (two) times daily before a meal. )  180 tablet 1  . levothyroxine (SYNTHROID, LEVOTHROID) 75 MCG tablet Take 1 tablet (75 mcg total) by mouth daily. (Patient taking differently: Take 75 mcg by mouth daily with supper. ) 90 tablet 1  . losartan (COZAAR) 50 MG tablet Take 1 tablet (50 mg total) by mouth daily. (Patient taking differently: Take 50 mg by mouth daily with supper. ) 90 tablet 3  . PHENobarbital (LUMINAL) 97.2 MG tablet TAKE 2 TABLETS BY MOUTH ONCE DAILY ON MON,WED,AND FRI AND 1 TABLET DAILY ON  OTHER DAYS (Patient taking differently: Take 97.2-194.4 mg by mouth See admin instructions. Take 194.4 mg by mouth with supper on Mon/Wed/Fri and 97.2 mg on Sun/Tues/Thurs/Sat) 135 tablet 1  . Vaginal Lubricant (REPLENS) GEL Place 1 application vaginally 3 (three) times a week. 35 g 11  . vitamin B-12 1000 MCG tablet Take 1 tablet (1,000 mcg total) by mouth daily. 30 tablet 0  . zolpidem (AMBIEN) 10 MG tablet take 1 tablet by mouth at bedtime for sleep if needed. (Patient taking differently: Take 10 mg by mouth at bedtime. ) 90 tablet 1   No current facility-administered medications on file prior to visit.     Allergies  Allergen Reactions  . Compazine Other (See Comments)    Bad headache, vomiting   . Cymbalta [Duloxetine Hcl] Other (See Comments)    "Kept me awake"  . Methadone Hives  . Escitalopram Anxiety  . Macrobid [Nitrofurantoin Monohyd Macro] Rash    Past Medical History:  Diagnosis Date  . Arthritis   . Atrophy of left kidney   . Cervicalgia   . Chronic kidney disease, stage 3 ([place])    acute aki 03-14-2018 in setting pyelonehritis-- resolved  . Chronic migraine   . Chronic pain syndrome   . History of kidney stones   . History of pyelonephritis    hx recurrent  . History of recurrent UTIs   . History of sepsis    07/ 2017 secondary to pyelonephritis  . Hypertension   . Hyperthyroidism   . Iron deficiency anemia   . Left ureteral stone   . Mixed hyperlipidemia   . Nephrolithiasis    right side nonobstructive per CT 03-14-2018  . Right shoulder pain    post sx 02-18-2018  . Seizure disorder ([place]) followed by pcp   per pt dx age 36--  no seizure since 1980's , controlled w/ phenobarbitol  . Type 2 diabetes mellitus ([place])    followed by pcp  . Wears glasses    Social History   Social History Narrative  . Not on file   Social History   Tobacco Use  . Smoking status: Never Smoker  . Smokeless tobacco: Never Used  Substance Use Topics  . Alcohol use: No      Alcohol/week: 0.0 oz  . Drug use: No   family history includes Diabetes in her father; Heart disease in her father.     Objective:  BP 120/60   Pulse 92   Temp 99.2 F (37.3 C) (Oral)   Resp 18   Ht 4\' 8"  (1.422 m)   Wt 152 lb 9.6 oz (69.2 kg)   SpO2 96%   BMI 34.21 kg/m  Body mass index is 34.21 kg/m.  Wt Readings from Last 3 Encounters:  04/08/18 152 lb 9.6 oz (69.2 kg)  03/19/18 158 lb (71.7 kg)  03/14/18 156 lb (70.8 kg)    Physical Exam  Constitutional: She is oriented to person, place, and time. She appears well-developed  and well-nourished.  HENT:  Head: Normocephalic and atraumatic.  Right Ear: Hearing and external ear normal.  Left Ear: Hearing and external ear normal.  Eyes: Conjunctivae are normal.  Neck: Normal range of motion.  Pulmonary/Chest: Effort normal.  Neurological: She is alert and oriented to person, place, and time.  Skin: Skin is warm, dry and intact.  Psychiatric: She has a normal mood and affect. Her behavior is normal. Judgment and thought content normal.  Vitals reviewed.   Assessment and Plan :  Renal stone -called and spoke with Anderson Malta Dr. Dahlstedt's office and discussed with him patient's current pain contract.  They are aware of now that she is on chronic pain medications.  They will start with pain control for patient though discussed that she will likely need higher doses of stronger medications.  Discussed with patient that she is able to get acute pain medication on top of her chronic pain medication during acute situations including surgery.  She should not take these 2 medications at the same time.  I suspect that patient will not respond well to pain medication given by surgeon as she is already alluded to this.  Suspect that she will want increased Stadol amounts during this time though did not approve this at this visit.  Post-operative pain - Plan: tiZANidine (ZANAFLEX) 4 MG tablet  Acute pain of right shoulder - Plan:  tiZANidine (ZANAFLEX) 4 MG tablet  Patient verbalized to me that they understand the following: diagnosis, what is being done for them, what to expect and what should be done at home.  Their questions have been answered.  See after visit summary for patient specific instructions.  Windell Hummingbird PA-C  [facility] 04/09/2018 7:52 AM  Please note: Portions of this report may have been transcribed using dragon voice recognition software. Every effort was made to ensure accuracy; however, inadvertent computerized transcription errors may be present.

## 2018-04-08 NOTE — Progress Notes (Signed)
Spoke w/ pt via phone for pre-op interview.  Npo after mn.  Arrive at 0900.  Current lab results, dated 03-19-2018, and ekg in chart and epic.  If needed may do stadol nasal spray am dos.

## 2018-04-09 ENCOUNTER — Encounter: Payer: Self-pay | Admitting: Physician Assistant

## 2018-04-09 NOTE — H&P (Signed)
H&P  Chief Complaint: Left kidney stone  History of Present Illness: Natalie [name] is a 67 y.o. year old female presents for ureteroscopic mgmt of a 10 mm left distal ureteral stone.  Past Medical History:  Diagnosis Date  . Arthritis   . Atrophy of left kidney   . Cervicalgia   . Chronic kidney disease, stage 3 ([place])    acute aki 03-14-2018 in setting pyelonehritis-- resolved  . Chronic migraine   . Chronic pain syndrome   . History of kidney stones   . History of pyelonephritis    hx recurrent  . History of recurrent UTIs   . History of sepsis    07/ 2017 secondary to pyelonephritis  . Hypertension   . Hyperthyroidism   . Iron deficiency anemia   . Left ureteral stone   . Mixed hyperlipidemia   . Nephrolithiasis    right side nonobstructive per CT 03-14-2018  . Right shoulder pain    post sx 02-18-2018  . Seizure disorder ([place]) followed by pcp   per pt dx age 31--  no seizure since 1980's , controlled w/ phenobarbitol  . Type 2 diabetes mellitus ([place])    followed by pcp  . Wears glasses     Past Surgical History:  Procedure Laterality Date  . CESAREAN SECTION  1974  . CHOLECYSTECTOMY OPEN  2002  . CYSTO/  LEFT RETROGRADE PYELOGRAM  06-11-2006   dr Jeffie Pollock  [facility]  . IR URETERAL STENT PLACEMENT EXISTING ACCESS RIGHT  08-21-2007    dr Karsten Ro  [facility]  . LEFT RETROGRADE PYELOGRAM/ LEFT URETERAL STENT PLACEMENT  02-25-2001   dr Jeffie Pollock [facility]  . NEPHROLITHOTOMY Right 05/14/2016   Procedure: NEPHROLITHOTOMY PERCUTANEOUS;  Surgeon: Franchot Gallo, MD;  Location: [facility];  Service: Urology;  Laterality: Right;  . SHOULDER ARTHROSCOPY Bilateral left 11-13-2000;  right 02-18-2018    Home Medications:  No medications prior to admission.    Allergies:  Allergies  Allergen Reactions  . Compazine Other (See Comments)    Bad headache, vomiting   . Cymbalta [Duloxetine Hcl] Other (See Comments)    "Kept me awake"  . Methadone Hives  . Escitalopram Anxiety  . Macrobid  [Nitrofurantoin Monohyd Macro] Rash    Family History  Problem Relation Age of Onset  . Heart disease Father   . Diabetes Father     Social History:  reports that she has never smoked. She has never used smokeless tobacco. She reports that she does not drink alcohol or use drugs.  ROS: A complete review of systems was performed.  All systems are negative except for pertinent findings as noted.  Physical Exam:  Vital signs in last 24 hours:   General:  Alert and oriented, No acute distress HEENT: Normocephalic, atraumatic Neck: No JVD or lymphadenopathy Cardiovascular: Regular rate and rhythm Lungs: Clear bilaterally Abdomen: Soft, nontender, nondistended, no abdominal masses Back: No CVA tenderness Extremities: No edema Neurologic: Grossly intact  Laboratory Data:  No results found for this or any previous visit (from the past 24 hour(s)). No results found for this or any previous visit (from the past 240 hour(s)). Creatinine: No results for input(s): CREATININE in the last 168 hours.  Radiologic Imaging: No results found.  Impression/Assessment:  10 mm left ureteral stone  Plan:  Cysto, left RGP, left URS, HLL left ureteral stone, stone extraction, possible left J2 stent  Lillette Boxer Terez Freimark 04/09/2018, 8:28 PM  Lillette Boxer. Makia Bossi MD

## 2018-04-10 ENCOUNTER — Ambulatory Visit (HOSPITAL_BASED_OUTPATIENT_CLINIC_OR_DEPARTMENT_OTHER)
Admission: RE | Admit: 2018-04-10 | Discharge: 2018-04-10 | Disposition: A | Discharge status: Home / Self Care | Payer: Medicare Other | Source: Ambulatory Visit | Attending: Urology | Admitting: Urology

## 2018-04-10 ENCOUNTER — Encounter: Payer: Self-pay | Admitting: Physician Assistant

## 2018-04-10 ENCOUNTER — Encounter (HOSPITAL_BASED_OUTPATIENT_CLINIC_OR_DEPARTMENT_OTHER)
Admission: RE | Disposition: A | Discharge status: Home / Self Care | Payer: Self-pay | Source: Ambulatory Visit | Attending: Urology

## 2018-04-10 ENCOUNTER — Ambulatory Visit (HOSPITAL_BASED_OUTPATIENT_CLINIC_OR_DEPARTMENT_OTHER): Payer: Medicare Other | Admitting: Certified Registered"

## 2018-04-10 ENCOUNTER — Encounter (HOSPITAL_BASED_OUTPATIENT_CLINIC_OR_DEPARTMENT_OTHER): Payer: Self-pay

## 2018-04-10 ENCOUNTER — Other Ambulatory Visit: Payer: Self-pay

## 2018-04-10 DIAGNOSIS — Z79899 Other long term (current) drug therapy: Secondary | ICD-10-CM | POA: Diagnosis not present

## 2018-04-10 DIAGNOSIS — E0590 Thyrotoxicosis, unspecified without thyrotoxic crisis or storm: Secondary | ICD-10-CM | POA: Diagnosis not present

## 2018-04-10 DIAGNOSIS — I129 Hypertensive chronic kidney disease with stage 1 through stage 4 chronic kidney disease, or unspecified chronic kidney disease: Secondary | ICD-10-CM | POA: Diagnosis not present

## 2018-04-10 DIAGNOSIS — E1122 Type 2 diabetes mellitus with diabetic chronic kidney disease: Secondary | ICD-10-CM | POA: Insufficient documentation

## 2018-04-10 DIAGNOSIS — N183 Chronic kidney disease, stage 3 (moderate): Secondary | ICD-10-CM | POA: Insufficient documentation

## 2018-04-10 DIAGNOSIS — N201 Calculus of ureter: Secondary | ICD-10-CM | POA: Diagnosis not present

## 2018-04-10 DIAGNOSIS — Z87440 Personal history of urinary (tract) infections: Secondary | ICD-10-CM | POA: Diagnosis not present

## 2018-04-10 HISTORY — DX: Chronic kidney disease, stage 3 (moderate): N18.3

## 2018-04-10 HISTORY — DX: Personal history of other diseases of urinary system: Z87.448

## 2018-04-10 HISTORY — DX: Unspecified osteoarthritis, unspecified site: M19.90

## 2018-04-10 HISTORY — DX: Type 2 diabetes mellitus without complications: E11.9

## 2018-04-10 HISTORY — DX: Thyrotoxicosis, unspecified without thyrotoxic crisis or storm: E05.90

## 2018-04-10 HISTORY — DX: Chronic migraine without aura, not intractable, without status migrainosus: G43.709

## 2018-04-10 HISTORY — DX: Cervicalgia: M54.2

## 2018-04-10 HISTORY — PX: CYSTOSCOPY WITH RETROGRADE PYELOGRAM, URETEROSCOPY AND STENT PLACEMENT: SHX5789

## 2018-04-10 HISTORY — DX: Calculus of kidney: N20.0

## 2018-04-10 HISTORY — DX: Reserved for concepts with insufficient information to code with codable children: IMO0002

## 2018-04-10 HISTORY — DX: Atrophy of kidney (terminal): N26.1

## 2018-04-10 HISTORY — DX: Epilepsy, unspecified, not intractable, without status epilepticus: G40.909

## 2018-04-10 HISTORY — DX: Mixed hyperlipidemia: E78.2

## 2018-04-10 HISTORY — DX: Chronic pain syndrome: G89.4

## 2018-04-10 HISTORY — DX: Presence of spectacles and contact lenses: Z97.3

## 2018-04-10 HISTORY — DX: Pain in right shoulder: M25.511

## 2018-04-10 HISTORY — DX: Personal history of urinary (tract) infections: Z87.440

## 2018-04-10 HISTORY — DX: Personal history of other infectious and parasitic diseases: Z86.19

## 2018-04-10 HISTORY — DX: Chronic kidney disease, stage 3 unspecified: N18.30

## 2018-04-10 HISTORY — DX: Personal history of urinary calculi: Z87.442

## 2018-04-10 HISTORY — DX: Calculus of ureter: N20.1

## 2018-04-10 LAB — GLUCOSE, CAPILLARY
Glucose-Capillary: 153 mg/dL — ABNORMAL HIGH (ref 70–99)
Glucose-Capillary: 159 mg/dL — ABNORMAL HIGH (ref 70–99)

## 2018-04-10 SURGERY — CYSTOURETEROSCOPY, WITH RETROGRADE PYELOGRAM AND STENT INSERTION
Anesthesia: General | Laterality: Left

## 2018-04-10 MED ORDER — SODIUM CHLORIDE 0.9 % IR SOLN
Status: DC | PRN
  Administered 2018-04-10: 3000 mL via INTRAVESICAL

## 2018-04-10 MED ORDER — MIDAZOLAM HCL 2 MG/2ML IJ SOLN
INTRAMUSCULAR | Status: AC
  Filled 2018-04-10: qty 2

## 2018-04-10 MED ORDER — ONDANSETRON HCL 4 MG/2ML IJ SOLN
INTRAMUSCULAR | Status: AC
Start: 2018-04-10 — End: ?
  Filled 2018-04-10: qty 2

## 2018-04-10 MED ORDER — PROPOFOL 10 MG/ML IV BOLUS
INTRAVENOUS | Status: DC | PRN
  Administered 2018-04-10: 150 mg via INTRAVENOUS

## 2018-04-10 MED ORDER — FENTANYL CITRATE (PF) 100 MCG/2ML IJ SOLN
25.0000 ug | INTRAMUSCULAR | Status: DC | PRN
  Filled 2018-04-10: qty 1

## 2018-04-10 MED ORDER — DEXAMETHASONE SODIUM PHOSPHATE 10 MG/ML IJ SOLN
INTRAMUSCULAR | Status: AC
  Filled 2018-04-10: qty 1

## 2018-04-10 MED ORDER — DEXAMETHASONE SODIUM PHOSPHATE 10 MG/ML IJ SOLN
INTRAMUSCULAR | Status: DC | PRN
  Administered 2018-04-10: 10 mg via INTRAVENOUS

## 2018-04-10 MED ORDER — LIDOCAINE 2% (20 MG/ML) 5 ML SYRINGE
INTRAMUSCULAR | Status: AC
  Filled 2018-04-10: qty 5

## 2018-04-10 MED ORDER — FENTANYL CITRATE (PF) 100 MCG/2ML IJ SOLN
INTRAMUSCULAR | Status: AC
  Filled 2018-04-10: qty 2

## 2018-04-10 MED ORDER — CEPHALEXIN 500 MG PO CAPS: 500.0000 mg | ORAL_CAPSULE | Freq: Two times a day (BID) | ORAL | 0 refills | Status: AC

## 2018-04-10 MED ORDER — IOHEXOL 300 MG/ML  SOLN
INTRAMUSCULAR | Status: DC | PRN
  Administered 2018-04-10: 8 mL via URETHRAL

## 2018-04-10 MED ORDER — PROMETHAZINE HCL 25 MG/ML IJ SOLN
6.2500 mg | INTRAMUSCULAR | Status: DC | PRN
  Filled 2018-04-10: qty 1

## 2018-04-10 MED ORDER — ONDANSETRON HCL 4 MG/2ML IJ SOLN
INTRAMUSCULAR | Status: DC | PRN
  Administered 2018-04-10: 4 mg via INTRAVENOUS

## 2018-04-10 MED ORDER — CEFAZOLIN SODIUM-DEXTROSE 2-4 GM/100ML-% IV SOLN
INTRAVENOUS | Status: AC
  Filled 2018-04-10: qty 100

## 2018-04-10 MED ORDER — PROPOFOL 10 MG/ML IV BOLUS
INTRAVENOUS | Status: AC
  Filled 2018-04-10: qty 20

## 2018-04-10 MED ORDER — OXYBUTYNIN CHLORIDE 5 MG PO TABS: 5.0000 mg | ORAL_TABLET | Freq: Three times a day (TID) | ORAL | 1 refills | Status: DC | PRN

## 2018-04-10 MED ORDER — MIDAZOLAM HCL 5 MG/5ML IJ SOLN
INTRAMUSCULAR | Status: DC | PRN
  Administered 2018-04-10 (×2): 1 mg via INTRAVENOUS

## 2018-04-10 MED ORDER — FENTANYL CITRATE (PF) 100 MCG/2ML IJ SOLN
INTRAMUSCULAR | Status: DC | PRN
  Administered 2018-04-10: 50 ug via INTRAVENOUS

## 2018-04-10 MED ORDER — SODIUM CHLORIDE 0.9 % IV SOLN
INTRAVENOUS | Status: DC
  Administered 2018-04-10: 10:00:00 via INTRAVENOUS
  Filled 2018-04-10: qty 1000

## 2018-04-10 MED ORDER — LIDOCAINE 2% (20 MG/ML) 5 ML SYRINGE
INTRAMUSCULAR | Status: DC | PRN
  Administered 2018-04-10: 40 mg via INTRAVENOUS

## 2018-04-10 MED ORDER — CEFAZOLIN SODIUM-DEXTROSE 2-4 GM/100ML-% IV SOLN
2.0000 g | INTRAVENOUS | Status: AC
  Administered 2018-04-10: 2 g via INTRAVENOUS
  Filled 2018-04-10: qty 100

## 2018-04-10 SURGICAL SUPPLY — 31 items
BAG DRAIN URO-CYSTO SKYTR STRL (DRAIN) ×3 IMPLANT
BAG DRN UROCATH (DRAIN) ×1
BASKET STONE 1.7 NGAGE (UROLOGICAL SUPPLIES) ×2 IMPLANT
BASKET ZERO TIP NITINOL 2.4FR (BASKET) ×2 IMPLANT
BSKT STON RTRVL ZERO TP 2.4FR (BASKET) ×1
CATH INTERMIT  6FR 70CM (CATHETERS) ×3 IMPLANT
CATH URET 5FR 28IN CONE TIP (BALLOONS)
CATH URET 5FR 70CM CONE TIP (BALLOONS) IMPLANT
CLOTH BEACON ORANGE TIMEOUT ST (SAFETY) ×3 IMPLANT
ELECT REM PT RETURN 9FT ADLT (ELECTROSURGICAL)
ELECTRODE REM PT RTRN 9FT ADLT (ELECTROSURGICAL) IMPLANT
FIBER LASER FLEXIVA 200 (UROLOGICAL SUPPLIES) IMPLANT
FIBER LASER FLEXIVA 365 (UROLOGICAL SUPPLIES) IMPLANT
FIBER LASER TRAC TIP (UROLOGICAL SUPPLIES) IMPLANT
GLOVE BIO SURGEON STRL SZ8 (GLOVE) ×3 IMPLANT
GOWN STRL REUS W/ TWL XL LVL3 (GOWN DISPOSABLE) ×1 IMPLANT
GOWN STRL REUS W/TWL XL LVL3 (GOWN DISPOSABLE) ×3
GUIDEWIRE ANG ZIPWIRE 038X150 (WIRE) IMPLANT
GUIDEWIRE STR DUAL SENSOR (WIRE) ×2 IMPLANT
INFUSOR MANOMETER BAG 3000ML (MISCELLANEOUS) ×3 IMPLANT
IV NS IRRIG 3000ML ARTHROMATIC (IV SOLUTION) ×6 IMPLANT
KIT TURNOVER CYSTO (KITS) ×3 IMPLANT
MANIFOLD NEPTUNE II (INSTRUMENTS) IMPLANT
NS IRRIG 500ML POUR BTL (IV SOLUTION) ×3 IMPLANT
PACK CYSTO (CUSTOM PROCEDURE TRAY) ×3 IMPLANT
SHEATH ACCESS URETERAL 38CM (SHEATH) IMPLANT
SHEATH URETERAL 12FRX28CM (UROLOGICAL SUPPLIES) ×3 IMPLANT
STENT URET 6FRX24 CONTOUR (STENTS) ×2 IMPLANT
TUBE CONNECTING 12'X1/4 (SUCTIONS)
TUBE CONNECTING 12X1/4 (SUCTIONS) IMPLANT
TUBING UROLOGY SET (TUBING) ×2 IMPLANT

## 2018-04-10 NOTE — Anesthesia Postprocedure Evaluation (Signed)
Anesthesia Post Note  Patient: Meredyth Hornung  Procedure(s) Performed: CYSTOSCOPY WITH RETROGRADE PYELOGRAM, URETEROSCOPY AND STENT PLACEMENT (Left )     Patient location during evaluation: PACU Anesthesia Type: General Level of consciousness: awake and alert Pain management: pain level controlled Vital Signs Assessment: post-procedure vital signs reviewed and stable Respiratory status: spontaneous breathing, nonlabored ventilation, respiratory function stable and patient connected to nasal cannula oxygen Cardiovascular status: blood pressure returned to baseline and stable Postop Assessment: no apparent nausea or vomiting Anesthetic complications: no    Last Vitals:  Vitals:   04/10/18 1140 04/10/18 1145  BP: 137/73 140/68  Pulse:  83  Resp: 11 15  Temp: 36.8 C   SpO2:  100%    Last Pain:  Vitals:   04/10/18 1140  TempSrc:   PainSc: Asleep                 [facility] [place] S

## 2018-04-10 NOTE — Anesthesia Procedure Notes (Signed)
Procedure Name: LMA Insertion Date/Time: 04/10/2018 11:04 AM Performed by: Verlin Grills, CRNA Pre-anesthesia Checklist: Patient identified, Emergency Drugs available, Suction available and Patient being monitored Patient Re-evaluated:Patient Re-evaluated prior to induction Oxygen Delivery Method: Circle system utilized Preoxygenation: Pre-oxygenation with 100% oxygen Induction Type: IV induction Ventilation: Mask ventilation without difficulty LMA: LMA inserted LMA Size: 4.0 Number of attempts: 1 Placement Confirmation: positive ETCO2 and breath sounds checked- equal and bilateral Dental Injury: Teeth and Oropharynx as per pre-operative assessment

## 2018-04-10 NOTE — Transfer of Care (Signed)
Immediate Anesthesia Transfer of Care Note  Patient: Natalie [name]  Procedure(s) Performed: CYSTOSCOPY WITH RETROGRADE PYELOGRAM, URETEROSCOPY AND STENT PLACEMENT (Left )  Patient Location: PACU  Anesthesia Type:General  Level of Consciousness: awake, drowsy and patient cooperative  Airway & Oxygen Therapy: Patient Spontanous Breathing and Patient connected to nasal cannula oxygen  Post-op Assessment: Report given to RN and Post -op Vital signs reviewed and stable  Post vital signs: Reviewed and stable  Last Vitals:  Vitals Value Taken Time  BP 137/73 04/10/2018 11:39 AM  Temp    Pulse 91 04/10/2018 11:41 AM  Resp 8 04/10/2018 11:41 AM  SpO2 97 % 04/10/2018 11:41 AM  Vitals shown include unvalidated device data.  Last Pain:  Vitals:   04/10/18 0952  TempSrc:   PainSc: 0-No pain      Patients Stated Pain Goal: 0 (71/99/41 2904)  Complications: No apparent anesthesia complications

## 2018-04-10 NOTE — Discharge Instructions (Signed)
1. You may see some blood in the urine and may have some burning with urination for 48-72 hours. You also may notice that you have to urinate more frequently or urgently after your procedure which is normal.  2. You should call should you develop an inability urinate, fever > 101, persistent nausea and vomiting that prevents you from eating or drinking to stay hydrated.  3. If you have a stent, you will likely urinate more frequently and urgently until the stent is removed and you may experience some discomfort/pain in the lower abdomen and flank especially when urinating. You may take pain medication prescribed to you if needed for pain. You may also intermittently have blood in the urine until the stent is removed.  It is okay to pull the string and remove the stent on Monday morning.  Post Anesthesia Home Care Instructions  Activity: Get plenty of rest for the remainder of the day. A responsible individual must stay with you for 24 hours following the procedure.  For the next 24 hours, DO NOT: -Drive a car -Paediatric nurse -Drink alcoholic beverages -Take any medication unless instructed by your physician -Make any legal decisions or sign important papers.  Meals: Start with liquid foods such as gelatin or soup. Progress to regular foods as tolerated. Avoid greasy, spicy, heavy foods. If nausea and/or vomiting occur, drink only clear liquids until the nausea and/or vomiting subsides. Call your physician if vomiting continues.  Special Instructions/Symptoms: Your throat may feel dry or sore from the anesthesia or the breathing tube placed in your throat during surgery. If this causes discomfort, gargle with warm salt water. The discomfort should disappear within 24 hours.  If you had a scopolamine patch placed behind your ear for the management of post- operative nausea and/or vomiting:  1. The medication in the patch is effective for 72 hours, after which it should be removed.  Wrap patch  in a tissue and discard in the trash. Wash hands thoroughly with soap and water. 2. You may remove the patch earlier than 72 hours if you experience unpleasant side effects which may include dry mouth, dizziness or visual disturbances. 3. Avoid touching the patch. Wash your hands with soap and water after contact with the patch.

## 2018-04-10 NOTE — Op Note (Signed)
Preoperative diagnosis: 10 mm left distal ureteral stone  Postoperative diagnosis: Same  Principal procedure: Cystoscopy, left retrograde ureteropyelogram, fluoroscopic interpretation, left ureteroscopy, extraction of left distal ureteral stone, placement of 6 French by 24 cm contour double-J stent with tether,  Surgeon: Lissie Hinesley  Anesthesia: General with LMA  Complications: None  Specimen: Stone  Estimated blood loss: None  Indications: 67 year old female with large left distal ureteral stone.  She has been minimally symptomatic from this, but with the size of the stone being 10 mm, there is very little chance of passing.  She initially presented 3 weeks ago.  She presents at this time for ureteroscopic management possible using holmium laser.  I discussed the procedure with the patient as well as risks and complications.  These include but are not limited to infection, ureteral injury, need for stent, stent discomfort, anesthetic complications, among others.  She understands these and desires to proceed.  Findings: Bladder appeared normal.  No urothelial lesions were noted.  Ureteral orifices normal in configuration location.  Retrograde ureteral pyelogram performed using 6 [place] open-ended catheter and Omnipaque revealed a filling defect in the distal ureter consistent with her stone with no significant hydronephrosis.  Pyelocalyceal system normal.  Description of procedure: The patient was properly identified and marked in the holding area.  She was taken to the operating room where general anesthetic was administered with the LMA.  She was placed in the dorsolithotomy position.  Genitalia and perineum were prepped and draped.  Proper timeout was performed.  A 22 French panendoscope was advanced into the bladder and the above inspection performed.  The left ureteral orifice was cannulated with the open-ended catheter and gentle retrograde ureteral pyelogram was performed using Omnipaque.   Above-mentioned findings noted.  I then passed a sensor tip guidewire through the open-ended catheter up into the upper pole calyceal system where a curl was seen.  Open-ended catheter was removed.  I then remove the cystoscope.  Guidewire was left indwelling.  I first dilated the ureteral orifice with the obturator of the 12/14 ureteral access catheter, then the entire access catheter.  This was done without difficulty.  I then passed the ureteroscope, having remove the access catheter.  The ureteroscope encountered the stone approximately 2 cm up.  I was able to grasp the stone with the engage basket but it would not come through the ureteral orifice despite dilation.  Rather than proceed with laser fragmentation, I passed the 0 tip nitinol basket.  The stone was engaged and easily brought through the ureteral orifice without significant trauma.  I then passed ureteroscope again, no further ureteral calculi were noted.  I then remove the ureteroscope, backloaded the guidewire through the cystoscope, and over top of the guidewire using fluoroscopic and cystoscopic guidance, deployed a 6 [place] by 24 cm contour double-J stent.  The tether was left on.  Excellent proximal and distal curls were seen using fluoroscopy and cystoscopy, respectively.  The bladder was drained and the scope removed.  The thread was brought out the urethral meatus.  It was tied, and then trimmed.  It was tucked in the vagina.  At this point, the procedure was terminated.  The patient was awakened and taken to the PACU in stable condition.  She tolerated the procedure well.

## 2018-04-10 NOTE — Anesthesia Preprocedure Evaluation (Signed)
Anesthesia Evaluation  Patient identified by MRN, date of birth, ID band Patient awake    Reviewed: Allergy & Precautions, NPO status , Patient's Chart, lab work & pertinent test results  Airway Mallampati: II  TM Distance: >3 FB Neck ROM: Full    Dental no notable dental hx.    Pulmonary neg pulmonary ROS,    Pulmonary exam normal breath sounds clear to auscultation       Cardiovascular hypertension, Normal cardiovascular exam Rhythm:Regular Rate:Normal     Neuro/Psych Seizures -, Well Controlled,  negative psych ROS   GI/Hepatic negative GI ROS, Neg liver ROS,   Endo/Other  negative endocrine ROSdiabetes  Renal/GU negative Renal ROS  negative genitourinary   Musculoskeletal negative musculoskeletal ROS (+)   Abdominal   Peds negative pediatric ROS (+)  Hematology negative hematology ROS (+)   Anesthesia Other Findings   Reproductive/Obstetrics negative OB ROS                             Anesthesia Physical Anesthesia Plan  ASA: II  Anesthesia Plan: General   Post-op Pain Management:    Induction: Intravenous  PONV Risk Score and Plan: 3 and Ondansetron, Dexamethasone and Treatment may vary due to age or medical condition  Airway Management Planned: LMA  Additional Equipment:   Intra-op Plan:   Post-operative Plan: Extubation in OR  Informed Consent: I have reviewed the patients History and Physical, chart, labs and discussed the procedure including the risks, benefits and alternatives for the proposed anesthesia with the patient or authorized representative who has indicated his/her understanding and acceptance.   Dental advisory given  Plan Discussed with: CRNA and Surgeon  Anesthesia Plan Comments:         Anesthesia Quick Evaluation

## 2018-04-11 ENCOUNTER — Encounter: Payer: Self-pay | Admitting: Physician Assistant

## 2018-04-11 ENCOUNTER — Telehealth: Payer: Self-pay | Admitting: Family Medicine

## 2018-04-11 ENCOUNTER — Encounter (HOSPITAL_BASED_OUTPATIENT_CLINIC_OR_DEPARTMENT_OTHER): Payer: Self-pay | Admitting: Urology

## 2018-04-11 NOTE — Telephone Encounter (Signed)
Copied from [place] [phone]. Topic: Quick Communication - See Telephone Encounter >> Apr 11, 2018  8:57 AM Antonieta Iba C wrote: CRM for notification. See Telephone encounter for: 04/11/18.  Pharmacy called in to speak with provider. She would like the okay stating that pt will pick up medication every 5-7 days (butorphanol (STADOL) 10 MG/ML nasal spray )   CB: [phone] - [facility] / or [facility]

## 2018-04-13 ENCOUNTER — Other Ambulatory Visit: Payer: Self-pay | Admitting: Physician Assistant

## 2018-04-13 DIAGNOSIS — M25511 Pain in right shoulder: Secondary | ICD-10-CM

## 2018-04-13 DIAGNOSIS — G8918 Other acute postprocedural pain: Secondary | ICD-10-CM

## 2018-04-13 NOTE — Telephone Encounter (Signed)
Message re: pharmacist wants ok for pt to pick up meds every 7 days.

## 2018-04-14 NOTE — Telephone Encounter (Signed)
tizanidine refill Last Refill:04/08/18 # 120  Last OV: 04/08/18 PCP: Dr Brigitte Pulse Pharmacy:[facility]

## 2018-04-16 ENCOUNTER — Other Ambulatory Visit: Payer: Self-pay | Admitting: Physician Assistant

## 2018-04-16 DIAGNOSIS — M25511 Pain in right shoulder: Secondary | ICD-10-CM

## 2018-04-16 DIAGNOSIS — G8918 Other acute postprocedural pain: Secondary | ICD-10-CM

## 2018-04-17 ENCOUNTER — Other Ambulatory Visit: Payer: Self-pay | Admitting: Physician Assistant

## 2018-04-17 DIAGNOSIS — G8918 Other acute postprocedural pain: Secondary | ICD-10-CM

## 2018-04-17 DIAGNOSIS — M25511 Pain in right shoulder: Secondary | ICD-10-CM

## 2018-04-17 NOTE — Telephone Encounter (Signed)
Refill for Zanaflex 4 mg tab,  LR  04/08/18  for #120 tabs  LOV  04/08/18  NOV  05/05/18  Windell Hummingbird, PA

## 2018-04-27 ENCOUNTER — Other Ambulatory Visit: Payer: Self-pay | Admitting: Physician Assistant

## 2018-04-27 DIAGNOSIS — G8918 Other acute postprocedural pain: Secondary | ICD-10-CM

## 2018-04-27 DIAGNOSIS — M25511 Pain in right shoulder: Secondary | ICD-10-CM

## 2018-04-30 ENCOUNTER — Encounter: Payer: Self-pay | Admitting: Family Medicine

## 2018-05-01 ENCOUNTER — Telehealth: Payer: Self-pay | Admitting: Family Medicine

## 2018-05-01 ENCOUNTER — Other Ambulatory Visit: Payer: Self-pay

## 2018-05-01 DIAGNOSIS — N202 Calculus of kidney with calculus of ureter: Secondary | ICD-10-CM | POA: Diagnosis not present

## 2018-05-01 NOTE — Telephone Encounter (Signed)
Copied from [place] [phone]. Topic: Quick Communication - Rx Refill/Question >> May 01, 2018 11:02 AM Selinda Flavin B, NT wrote: Medication: butorphanol (STADOL) 10 MG/ML nasal spray   Has the patient contacted their pharmacy? Yes.   (Agent: If no, request that the patient contact the pharmacy for the refill.) (Agent: If yes, when and what did the pharmacy advise?)  Preferred Pharmacy (with phone number or street name): [id number] [place], [place]: Please be advised that RX refills may take up to 3 business days. We ask that you follow-up with your pharmacy.

## 2018-05-01 NOTE — Telephone Encounter (Addendum)
Butorphanol nasal spray refill Last Refill:03/19/18 Last OV: 04/08/18 PCP: Dr Delman Cheadle Pharmacy:[facility] [address] [place], [place]

## 2018-05-01 NOTE — Telephone Encounter (Signed)
Spoke about Rx request and pt states she has an appointment tomorrow with provider and will pick up refill at appointment.

## 2018-05-02 ENCOUNTER — Other Ambulatory Visit: Payer: Self-pay | Admitting: Family Medicine

## 2018-05-02 ENCOUNTER — Ambulatory Visit: Payer: Medicare Other | Admitting: Family Medicine

## 2018-05-02 ENCOUNTER — Other Ambulatory Visit: Payer: Self-pay | Admitting: Physician Assistant

## 2018-05-02 ENCOUNTER — Encounter: Payer: Self-pay | Admitting: Family Medicine

## 2018-05-02 ENCOUNTER — Telehealth: Payer: Self-pay | Admitting: Family Medicine

## 2018-05-02 DIAGNOSIS — G43809 Other migraine, not intractable, without status migrainosus: Secondary | ICD-10-CM

## 2018-05-02 MED ORDER — BUTORPHANOL TARTRATE 10 MG/ML NA SOLN: NASAL | 0 refills | Status: DC

## 2018-05-02 NOTE — Telephone Encounter (Signed)
PATIENT NEEDS TO KNOW IF HER MEDICATION CAN BE REFILLED BEING SHE HAD TO RESHCED HER APPNT ON [phone]

## 2018-05-02 NOTE — Telephone Encounter (Signed)
See pt. Request. Thanks.

## 2018-05-02 NOTE — Telephone Encounter (Signed)
Refilled sent to pharmacy.  

## 2018-05-02 NOTE — Telephone Encounter (Signed)
Please advise pt

## 2018-05-05 ENCOUNTER — Ambulatory Visit: Payer: Medicare Other | Admitting: Family Medicine

## 2018-05-05 ENCOUNTER — Other Ambulatory Visit: Payer: Self-pay | Admitting: Family Medicine

## 2018-05-05 ENCOUNTER — Encounter: Payer: Self-pay | Admitting: Family Medicine

## 2018-05-05 ENCOUNTER — Ambulatory Visit (INDEPENDENT_AMBULATORY_CARE_PROVIDER_SITE_OTHER): Payer: Medicare Other | Admitting: Family Medicine

## 2018-05-05 ENCOUNTER — Other Ambulatory Visit: Payer: Self-pay

## 2018-05-05 VITALS — BP 151/85 | HR 87 | Temp 99.2°F | Resp 18 | Ht <= 58 in | Wt 150.4 lb

## 2018-05-05 DIAGNOSIS — M25511 Pain in right shoulder: Secondary | ICD-10-CM | POA: Diagnosis not present

## 2018-05-05 DIAGNOSIS — E119 Type 2 diabetes mellitus without complications: Secondary | ICD-10-CM | POA: Diagnosis not present

## 2018-05-05 DIAGNOSIS — I10 Essential (primary) hypertension: Secondary | ICD-10-CM | POA: Diagnosis not present

## 2018-05-05 DIAGNOSIS — G8918 Other acute postprocedural pain: Secondary | ICD-10-CM | POA: Diagnosis not present

## 2018-05-05 DIAGNOSIS — G43809 Other migraine, not intractable, without status migrainosus: Secondary | ICD-10-CM

## 2018-05-05 DIAGNOSIS — E785 Hyperlipidemia, unspecified: Secondary | ICD-10-CM | POA: Diagnosis not present

## 2018-05-05 DIAGNOSIS — Z5181 Encounter for therapeutic drug level monitoring: Secondary | ICD-10-CM | POA: Diagnosis not present

## 2018-05-05 DIAGNOSIS — G4700 Insomnia, unspecified: Secondary | ICD-10-CM | POA: Diagnosis not present

## 2018-05-05 DIAGNOSIS — E782 Mixed hyperlipidemia: Secondary | ICD-10-CM

## 2018-05-05 DIAGNOSIS — G894 Chronic pain syndrome: Secondary | ICD-10-CM | POA: Diagnosis not present

## 2018-05-05 DIAGNOSIS — R7303 Prediabetes: Secondary | ICD-10-CM | POA: Diagnosis not present

## 2018-05-05 DIAGNOSIS — E039 Hypothyroidism, unspecified: Secondary | ICD-10-CM

## 2018-05-05 LAB — POCT GLYCOSYLATED HEMOGLOBIN (HGB A1C): HEMOGLOBIN A1C: 8.8 % — AB (ref 4.0–5.6)

## 2018-05-05 MED ORDER — SEMAGLUTIDE(0.25 OR 0.5MG/DOS) 2 MG/1.5ML ~~LOC~~ SOPN
PEN_INJECTOR | SUBCUTANEOUS | 2 refills | Status: DC
Start: 2018-05-05 — End: 2018-08-07

## 2018-05-05 NOTE — Patient Instructions (Signed)
     IF you received an x-ray today, you will receive an invoice from [facility]. Please contact [facility] at [phone] with questions or concerns regarding your invoice.   IF you received labwork today, you will receive an invoice from [facility]. Please contact [facility] at [phone] with questions or concerns regarding your invoice.   Our billing staff will not be able to assist you with questions regarding bills from these companies.  You will be contacted with the lab results as soon as they are available. The fastest way to get your results is to activate your My Chart account. Instructions are located on the last page of this paperwork. If you have not heard from us regarding the results in 2 weeks, please contact this office.     

## 2018-05-05 NOTE — Progress Notes (Signed)
Subjective:    Patient ID: Natalie [name], female    DOB: Dec 16, 1950, 67 y.o.   MRN: [id number] Chief Complaint  Patient presents with  . Medication Refill    refill on insulin.  Needs to talk about insulin and ? another medication not maintaining a1c under 7.0 running in the 8's    HPI  Insurance wouldn't cover ozempic. Needs refills on her stadol that she uses for chronic migraines. Needs more of the zanaflex - has been helping a lot since recent shoulder surgery  Lab Results  Component Value Date   HGBA1C 8.8 02/08/2018   HGBA1C 8.4 11/23/2017   HGBA1C 8.0 07/29/2017   HGBA1C 7.5 04/12/2017   HGBA1C 6.9 (H) 12/27/2016   HGBA1C 8.6 (H) 06/23/2016   HGBA1C 7.7 (H) 04/11/2016   HGBA1C 7.2 01/04/2016    Shoulder not healting well  - it is not working. THe muscle relaxers due help her move a little more more. She is still haing pain wt the areas where the bone was shaved at [facility] and clavice.  [facility] 8/1 - her urine is sent to be clx  And she has an appointment for an [place] on 812,  Past Medical History:  Diagnosis Date  . Arthritis   . Atrophy of left kidney   . Cervicalgia   . Chronic kidney disease, stage 3 ([place])    acute aki 03-14-2018 in setting pyelonehritis-- resolved  . Chronic migraine   . Chronic pain syndrome   . History of kidney stones   . History of pyelonephritis    hx recurrent  . History of recurrent UTIs   . History of sepsis    07/ 2017 secondary to pyelonephritis  . Hypertension   . Hyperthyroidism   . Iron deficiency anemia   . Left ureteral stone   . Mixed hyperlipidemia   . Nephrolithiasis    right side nonobstructive per CT 03-14-2018  . Right shoulder pain    post sx 02-18-2018  . Seizure disorder ([place]) followed by pcp   per pt dx age 26--  no seizure since 1980's , controlled w/ phenobarbitol  . Type 2 diabetes mellitus ([place])    followed by pcp  . Wears glasses    Past Surgical History:  Procedure Laterality Date  . CESAREAN  SECTION  1974  . CHOLECYSTECTOMY OPEN  2002  . CYSTO/  LEFT RETROGRADE PYELOGRAM  06-11-2006   dr Jeffie Pollock  [facility]  . CYSTOSCOPY WITH RETROGRADE PYELOGRAM, URETEROSCOPY AND STENT PLACEMENT Left 04/10/2018   Procedure: CYSTOSCOPY WITH RETROGRADE PYELOGRAM, URETEROSCOPY AND STENT PLACEMENT;  Surgeon: Franchot Gallo, MD;  Location: [facility];  Service: Urology;  Laterality: Left;  . IR URETERAL STENT PLACEMENT EXISTING ACCESS RIGHT  08-21-2007    dr Karsten Ro  [facility]  . LEFT RETROGRADE PYELOGRAM/ LEFT URETERAL STENT PLACEMENT  02-25-2001   dr Jeffie Pollock [facility]  . NEPHROLITHOTOMY Right 05/14/2016   Procedure: NEPHROLITHOTOMY PERCUTANEOUS;  Surgeon: Franchot Gallo, MD;  Location: [facility];  Service: Urology;  Laterality: Right;  . SHOULDER ARTHROSCOPY Bilateral left 11-13-2000;  right 02-18-2018   Current Outpatient Medications on File Prior to Visit  Medication Sig Dispense Refill  . amLODipine (NORVASC) 5 MG tablet Take 1 tablet (5 mg total) by mouth daily. (Patient taking differently: Take 5 mg by mouth daily with supper. ) 90 tablet 3  . atorvastatin (LIPITOR) 20 MG tablet Take 1 tablet (20 mg total) by mouth daily. (Patient taking differently: Take 20 mg by mouth  daily with supper. ) 90 tablet 3  . estradiol (ESTRACE) 0.1 MG/GM vaginal cream Use 1 fingertip amount to urethra, clitoris, and vaginal introitus every night x 2 weeks, then decreased to 2-3x weekly 42.5 g 5  . levothyroxine (SYNTHROID, LEVOTHROID) 75 MCG tablet Take 1 tablet (75 mcg total) by mouth daily. (Patient taking differently: Take 75 mcg by mouth daily with supper. ) 90 tablet 1  . losartan (COZAAR) 50 MG tablet Take 1 tablet (50 mg total) by mouth daily. (Patient taking differently: Take 50 mg by mouth daily with supper. ) 90 tablet 3  . Vaginal Lubricant (REPLENS) GEL Place 1 application vaginally 3 (three) times a week. 35 g 11  . vitamin B-12 1000 MCG tablet Take 1 tablet (1,000 mcg total) by mouth daily. 30 tablet 0    No current facility-administered medications on file prior to visit.    Allergies  Allergen Reactions  . Compazine Other (See Comments)    Bad headache, vomiting   . Cymbalta [Duloxetine Hcl] Other (See Comments)    "Kept me awake"  . Methadone Hives  . Escitalopram Anxiety  . Macrobid [Nitrofurantoin Monohyd Macro] Rash   Family History  Problem Relation Age of Onset  . Heart disease Father   . Diabetes Father    Social History   Socioeconomic History  . Marital status: Married    Spouse name: Not on file  . Number of children: Not on file  . Years of education: Not on file  . Highest education level: Not on file  Occupational History  . Not on file  Social Needs  . Financial resource strain: Not on file  . Food insecurity:    Worry: Not on file    Inability: Not on file  . Transportation needs:    Medical: Not on file    Non-medical: Not on file  Tobacco Use  . Smoking status: Never Smoker  . Smokeless tobacco: Never Used  Substance and Sexual Activity  . Alcohol use: No    Alcohol/week: 0.0 standard drinks  . Drug use: No  . Sexual activity: Not on file  Lifestyle  . Physical activity:    Days per week: Not on file    Minutes per session: Not on file  . Stress: Not on file  Relationships  . Social connections:    Talks on phone: Not on file    Gets together: Not on file    Attends religious service: Not on file    Active member of club or organization: Not on file    Attends meetings of clubs or organizations: Not on file    Relationship status: Not on file  Other Topics Concern  . Not on file  Social History Narrative  . Not on file   Depression screen [facility] 2/9 05/05/2018 03/19/2018 03/03/2018 02/08/2018 11/23/2017  Decreased Interest 0 0 0 0 0  Down, Depressed, Hopeless 0 0 0 0 0  PHQ - 2 Score 0 0 0 0 0      Review of Systems     Objective:   Physical Exam  Constitutional: She is oriented to person, place, and time. She appears  well-developed and well-nourished. No distress.  HENT:  Head: Normocephalic and atraumatic.  Right Ear: External ear normal.  Left Ear: External ear normal.  Eyes: Conjunctivae are normal. No scleral icterus.  Neck: Normal range of motion. Neck supple. No thyromegaly present.  Cardiovascular: Normal rate, regular rhythm, normal heart sounds and intact distal pulses.  Pulmonary/Chest: Effort normal and breath sounds normal. No respiratory distress.  Musculoskeletal: She exhibits no edema.  Lymphadenopathy:    She has no cervical adenopathy.  Neurological: She is alert and oriented to person, place, and time.  Skin: Skin is warm and dry. She is not diaphoretic. No erythema.  Psychiatric: She has a normal mood and affect. Her behavior is normal.       Assessment & Plan:  Takes zolpidem 1/2 - 1 orn but takes on most night. OK to refill zolpidemen whenever pt caslls in for it. ozempic not covered by insurance - byetta or bydureon are so will change to the weekly Bydureon BCise . 1. Type 2 diabetes mellitus without complication, without long-term current use of insulin (HCC) -  Lab Results  Component Value Date   HGBA1C 8.8 (A) 05/05/2018   HGBA1C 8.8 02/08/2018   HGBA1C 8.4 11/23/2017     2. Medication monitoring encounter   3. Hyperlipidemia, unspecified hyperlipidemia type - increase atorvastatin from 20 to 40 as LDL > 100 - recheck lfts at next f/u OV in 3 mos  4. Essential hypertension   5. Mixed hyperlipidemia   6. Hypothyroidism, unspecified type   7. Other migraine without status migrainosus, not intractable   8. Post-operative pain   9. Acute pain of right shoulder   10. Insomnia, unspecified type   11. Chronic pain syndrome - ok to continue to refill Stadol nasal spray for her migraines weekly as she has been doing for years.   F/u in 3 mos to recheck cmp and a1c since increase statin and starting new GLP1 DM med Orders Placed This Encounter  Procedures  .  Comprehensive metabolic panel  . LDL Cholesterol, Direct  . POCT glycosylated hemoglobin (Hb A1C)    Meds ordered this encounter  Medications  . Semaglutide (OZEMPIC) 0.25 or 0.5 MG/DOSE SOPN    Sig: Inject 0.25 mg into the skin once a week for 28 days, THEN 0.5 mg once a week.    Dispense:  2 mL    Refill:  2  . PHENobarbital (LUMINAL) 97.2 MG tablet    Sig: TAKE 2 TABLETS BY MOUTH ONCE DAILY ON MON,WED,AND FRI AND 1 TABLET DAILY ON OTHER DAYS    Dispense:  135 tablet    Refill:  1    [facility] [place] [place] F: [phone]  . tiZANidine (ZANAFLEX) 4 MG tablet    Sig: TAKE 1 TABLET BY MOUTH EVERY 6 HRS AS NEEDED FOR MUSCLE SPASMS. AND TAKE EVERY NIGHT BEFORE BED    Dispense:  120 tablet    Refill:  0  . glipiZIDE (GLUCOTROL) 10 MG tablet    Sig: Take 2 tablets (20 mg total) by mouth 2 (two) times daily before a meal.    Dispense:  360 tablet    Refill:  1  . zolpidem (AMBIEN) 10 MG tablet    Sig: take 1 tablet by mouth at bedtime for sleep if needed.    Dispense:  90 tablet    Refill:  1  . Exenatide ER (BYDUREON BCISE) 2 MG/0.85ML AUIJ    Sig: Inject 2 mg into the skin once a week.    Dispense:  4 pen    Refill:  5     Delman Cheadle, M.D.  Primary Care at [facility] [address] [place], [place] [address] [phone] phone [phone] fax  05/08/18 11:30 AM  Results for orders placed or performed in  visit on 05/05/18  Comprehensive metabolic panel  Result Value Ref Range   Glucose 178 (H) 65 - 99 mg/dL   BUN 19 8 - 27 mg/dL   Creatinine, Ser 1.12 (H) 0.57 - 1.00 mg/dL   GFR calc non Af Amer 50 (L) >59 mL/min/1.73   GFR calc Af Amer 58 (L) >59 mL/min/1.73   BUN/Creatinine Ratio 17 12 - 28   Sodium 141 134 - 144 mmol/L   Potassium 5.0 3.5 - 5.2 mmol/L   Chloride 103 96 - 106 mmol/L   CO2 22 20 - 29 mmol/L   Calcium 10.4 (H) 8.7 - 10.3 mg/dL   Total Protein 7.0 6.0 - 8.5 g/dL   Albumin 4.5 3.6 - 4.8 g/dL   Globulin, Total 2.5 1.5 -  4.5 g/dL   Albumin/Globulin Ratio 1.8 1.2 - 2.2   Bilirubin Total <0.2 0.0 - 1.2 mg/dL   Alkaline Phosphatase 103 39 - 117 IU/L   AST 21 0 - 40 IU/L   ALT 22 0 - 32 IU/L  LDL Cholesterol, Direct  Result Value Ref Range   LDL Direct 107 (H) 0 - 99 mg/dL  POCT glycosylated hemoglobin (Hb A1C)  Result Value Ref Range   Hemoglobin A1C 8.8 (A) 4.0 - 5.6 %   HbA1c POC (<> result, manual entry)  4.0 - 5.6 %   HbA1c, POC (prediabetic range)  5.7 - 6.4 %   HbA1c, POC (controlled diabetic range)  0.0 - 7.0 %

## 2018-05-06 ENCOUNTER — Other Ambulatory Visit: Payer: Self-pay | Admitting: Physician Assistant

## 2018-05-06 ENCOUNTER — Telehealth: Payer: Self-pay | Admitting: Family Medicine

## 2018-05-06 DIAGNOSIS — G43809 Other migraine, not intractable, without status migrainosus: Secondary | ICD-10-CM

## 2018-05-06 LAB — COMPREHENSIVE METABOLIC PANEL
ALBUMIN: 4.5 g/dL (ref 3.6–4.8)
ALT: 22 IU/L (ref 0–32)
AST: 21 IU/L (ref 0–40)
Albumin/Globulin Ratio: 1.8 (ref 1.2–2.2)
Alkaline Phosphatase: 103 IU/L (ref 39–117)
BUN / CREAT RATIO: 17 (ref 12–28)
BUN: 19 mg/dL (ref 8–27)
CHLORIDE: 103 mmol/L (ref 96–106)
CO2: 22 mmol/L (ref 20–29)
Calcium: 10.4 mg/dL — ABNORMAL HIGH (ref 8.7–10.3)
Creatinine, Ser: 1.12 mg/dL — ABNORMAL HIGH (ref 0.57–1.00)
GFR calc non Af Amer: 50 mL/min/{1.73_m2} — ABNORMAL LOW (ref >59)
GFR, EST AFRICAN AMERICAN: 58 mL/min/{1.73_m2} — AB (ref >59)
GLOBULIN, TOTAL: 2.5 g/dL (ref 1.5–4.5)
GLUCOSE: 178 mg/dL — AB (ref 65–99)
Potassium: 5 mmol/L (ref 3.5–5.2)
Sodium: 141 mmol/L (ref 134–144)
Total Protein: 7 g/dL (ref 6.0–8.5)

## 2018-05-06 LAB — LDL CHOLESTEROL, DIRECT: LDL Direct: 107 mg/dL — ABNORMAL HIGH (ref 0–99)

## 2018-05-06 NOTE — Telephone Encounter (Signed)
See pharmacy request for alternative prescription for Semaglutide, due to insurance coverage  Last office visit 05/05/18  [facility] [place] [place]

## 2018-05-06 NOTE — Telephone Encounter (Signed)
Patient  stopped in to say that insurance wont pay for OZEMPIC 0.25 MG  BUT, will pay for [facility]. Can you authorize  refill for the Byetta? Also, did not receive refill for butorphanol 10MG  nasal spray. Could you resend ? FR   pt number is [phone] also did muscle relaxer refill for TIZANIDINE 4MG ..

## 2018-05-08 ENCOUNTER — Other Ambulatory Visit: Payer: Self-pay | Admitting: Physician Assistant

## 2018-05-08 ENCOUNTER — Other Ambulatory Visit: Payer: Self-pay

## 2018-05-08 ENCOUNTER — Other Ambulatory Visit: Payer: Self-pay | Admitting: Family Medicine

## 2018-05-08 DIAGNOSIS — M25511 Pain in right shoulder: Secondary | ICD-10-CM

## 2018-05-08 DIAGNOSIS — G8918 Other acute postprocedural pain: Secondary | ICD-10-CM

## 2018-05-08 DIAGNOSIS — G43809 Other migraine, not intractable, without status migrainosus: Secondary | ICD-10-CM

## 2018-05-08 MED ORDER — BUTORPHANOL TARTRATE 10 MG/ML NA SOLN
NASAL | 5 refills | Status: DC
Start: 2018-05-08 — End: 2018-06-14

## 2018-05-08 MED ORDER — ZOLPIDEM TARTRATE 10 MG PO TABS: ORAL_TABLET | ORAL | 1 refills | Status: DC

## 2018-05-08 MED ORDER — TIZANIDINE HCL 4 MG PO TABS: ORAL_TABLET | ORAL | 0 refills | Status: DC

## 2018-05-08 MED ORDER — BUTORPHANOL TARTRATE 10 MG/ML NA SOLN: NASAL | 0 refills | Status: DC

## 2018-05-08 MED ORDER — EXENATIDE ER 2 MG/0.85ML ~~LOC~~ AUIJ: 2.0000 mg | AUTO-INJECTOR | SUBCUTANEOUS | 5 refills | Status: DC

## 2018-05-08 MED ORDER — PHENOBARBITAL 97.2 MG PO TABS: ORAL_TABLET | ORAL | 1 refills | Status: DC

## 2018-05-08 MED ORDER — GLIPIZIDE 10 MG PO TABS: 20.0000 mg | ORAL_TABLET | Freq: Two times a day (BID) | ORAL | 1 refills | Status: DC

## 2018-05-08 NOTE — Telephone Encounter (Signed)
Pt states she called the pharmacy and they have not received the updated rx for Bydureon, butorphanol 10MG  nasal spray and tiZANidine (ZANAFLEX) 4 MG tablet. Pt also states she is going out of town, and needs thesee rxs before she leaves.

## 2018-05-12 ENCOUNTER — Ambulatory Visit: Payer: Medicare Other | Admitting: Family Medicine

## 2018-05-12 DIAGNOSIS — N202 Calculus of kidney with calculus of ureter: Secondary | ICD-10-CM | POA: Diagnosis not present

## 2018-05-14 DIAGNOSIS — M19011 Primary osteoarthritis, right shoulder: Secondary | ICD-10-CM | POA: Diagnosis not present

## 2018-05-14 MED ORDER — ATORVASTATIN CALCIUM 40 MG PO TABS: 40.0000 mg | ORAL_TABLET | Freq: Every day | ORAL | 1 refills | Status: DC

## 2018-05-14 NOTE — Telephone Encounter (Signed)
Yes, that is fine. 

## 2018-05-15 DIAGNOSIS — Z23 Encounter for immunization: Secondary | ICD-10-CM | POA: Diagnosis not present

## 2018-05-31 ENCOUNTER — Encounter: Payer: Medicare Other | Admitting: Family Medicine

## 2018-06-02 ENCOUNTER — Other Ambulatory Visit: Payer: Self-pay | Admitting: Family Medicine

## 2018-06-02 DIAGNOSIS — M25511 Pain in right shoulder: Secondary | ICD-10-CM

## 2018-06-02 DIAGNOSIS — G8918 Other acute postprocedural pain: Secondary | ICD-10-CM

## 2018-06-06 ENCOUNTER — Telehealth: Payer: Self-pay | Admitting: Family Medicine

## 2018-06-06 DIAGNOSIS — G43809 Other migraine, not intractable, without status migrainosus: Secondary | ICD-10-CM

## 2018-06-06 NOTE — Telephone Encounter (Signed)
Rescheduled patient's appt with Dr Brigitte Pulse from 9/17 to 07/02/2018.  However, she will need a refill on her Losartan and her Stadol.  She wanted me to note that she gets her Stadol every week and each prescription usually has 5 refills

## 2018-06-13 ENCOUNTER — Encounter: Payer: Self-pay | Admitting: Family Medicine

## 2018-06-13 NOTE — Telephone Encounter (Signed)
Pt states that she gets this refilled every week.she states she has no refills  Please advise

## 2018-06-13 NOTE — Telephone Encounter (Signed)
Dr Brigitte Pulse please advise. DGaddy, CMA

## 2018-06-14 MED ORDER — TIZANIDINE HCL 4 MG PO TABS
ORAL_TABLET | ORAL | 1 refills | Status: DC
Start: 2018-06-14 — End: 2018-08-07

## 2018-06-14 MED ORDER — BUTORPHANOL TARTRATE 10 MG/ML NA SOLN: NASAL | 5 refills | Status: DC

## 2018-06-14 NOTE — Telephone Encounter (Signed)
Stadol and tizanidine sent in for pt - I'm sure her 10/4 appt will be cancelled as well so it is ok for her to plan our next visit for Nov - if she is having any problems or complications in the next mo, she will prob have to see one of my partners.  I sent in a year of losartan 02/08/2018 so she just needs to ask the pharmacist to fill it - it is prob held on file for her.  Notified pt by MyChart

## 2018-06-16 ENCOUNTER — Telehealth: Payer: Self-pay | Admitting: Family Medicine

## 2018-06-16 NOTE — Telephone Encounter (Signed)
Copied from [place] [id number]. Topic: Quick Communication - See Telephone Encounter >> Jun 16, 2018  3:53 PM Antonieta Iba C wrote: CRM for notification. See Telephone encounter for: 06/16/18.  Pharmacy called in to be advised. Pt's current prescription dose is on back order. Pharmacy would like to know if they could provide 25 MG 2 times a day instead?   [phone]- Vicente Males

## 2018-06-17 ENCOUNTER — Ambulatory Visit: Payer: Medicare Other | Admitting: Family Medicine

## 2018-06-17 NOTE — Telephone Encounter (Signed)
Pharmacy advised [facility]

## 2018-06-20 ENCOUNTER — Telehealth: Payer: Self-pay | Admitting: Family Medicine

## 2018-06-20 NOTE — Telephone Encounter (Signed)
From previous notes, Losartan is on back order. I called pharmacy and she seems to think if you call in 100 MG and have her take 1/2 a day, then they should cover it. Right now, it is being denied with the 25 mg 2X daily.   Please advise.  Thank you!

## 2018-06-20 NOTE — Telephone Encounter (Signed)
error 

## 2018-06-23 NOTE — Telephone Encounter (Signed)
Waiting on response for the Losartan. Please see note below.   Thank you!

## 2018-06-24 ENCOUNTER — Telehealth: Payer: Self-pay | Admitting: Family Medicine

## 2018-06-24 NOTE — Telephone Encounter (Signed)
Please see notes from 9/16 and 9/17 regarding the Losartan.   Please advise -

## 2018-07-01 NOTE — Telephone Encounter (Signed)
Still waiting on clarification. Patient is still waiting on a RX for 100 mg - 1/2 tab daily. Spoke to pharmacy and the 50 mg tabs are still on backorder. They are advising the 100 mg 1/2 tab daily.  Please advise. Original message was from September 16.  Thank you!

## 2018-07-02 ENCOUNTER — Ambulatory Visit: Payer: Medicare Other | Admitting: Family Medicine

## 2018-07-07 ENCOUNTER — Encounter: Payer: Self-pay | Admitting: Family Medicine

## 2018-07-08 ENCOUNTER — Encounter: Payer: Self-pay | Admitting: Family Medicine

## 2018-07-09 ENCOUNTER — Telehealth: Payer: Self-pay

## 2018-07-09 NOTE — Telephone Encounter (Signed)
Pt came into the office today and states, The pharmacy told her they will not fill her Losartan medication because there is a recall and the one she is taking is no longer in stock. Pt would like to know if you could send in a different B?P medication she can take. Please advise.

## 2018-07-14 NOTE — Telephone Encounter (Signed)
Pt is still waiting on advisement for the discontinued Losartan.Please see previous notes both from patient and clinical from 06/16/2018,  06/20/2018 and 06/23/2018  Please advise.

## 2018-07-23 ENCOUNTER — Encounter: Payer: Self-pay | Admitting: Family Medicine

## 2018-07-26 ENCOUNTER — Encounter: Payer: Self-pay | Admitting: Family Medicine

## 2018-07-28 ENCOUNTER — Other Ambulatory Visit: Payer: Self-pay | Admitting: Family Medicine

## 2018-07-28 DIAGNOSIS — G43809 Other migraine, not intractable, without status migrainosus: Secondary | ICD-10-CM

## 2018-07-28 NOTE — Telephone Encounter (Signed)
Copied from [place] [id number]. Topic: Quick Communication - Rx Refill/Question >> Jul 28, 2018  1:17 PM Leward Quan A wrote: Medication: butorphanol (STADOL) 10 MG/ML nasal spray  Has the patient contacted their pharmacy? Yes.     Preferred Pharmacy (with phone number or street name): [id number] [place], [place] [phone] (Phone) [phone] (Fax)    Agent: Please be advised that RX refills may take up to 3 business days. We ask that you follow-up with your pharmacy.

## 2018-07-29 ENCOUNTER — Encounter: Payer: Self-pay | Admitting: Family Medicine

## 2018-07-29 NOTE — Telephone Encounter (Signed)
Requested medication (s) are due for refill today: yes  Requested medication (s) are on the active medication list: yes  Last refill:  06/14/18  Future visit scheduled: yes  Notes to clinic:  undelegated    Requested Prescriptions  Pending Prescriptions Disp Refills   butorphanol (STADOL) 10 MG/ML nasal spray 2.5 mL 5    Sig: PLACE 1 SPRAY INTO THE NOSE EVERY 4 HOURS AS NEEDED FOR MIGRAINE.     Not Delegated - Analgesics:  Opioid Agonists Failed - 07/28/2018  1:33 PM      Failed - This refill cannot be delegated      Failed - Urine Drug Screen completed in last 360 days.      Passed - Valid encounter within last 6 months    Recent Outpatient Visits          2 months ago Type 2 diabetes mellitus without complication, without long-term current use of insulin [facility])   Primary [facility] at [facility], Laurey Arrow, MD   3 months ago Renal stone   [facility] at [facility], PA-C   4 months ago Recurrent UTI   [facility] at [facility], Laurey Arrow, MD   4 months ago Migraine, chronic, without aura, intractable, with status migrainosus   Primary Care at [facility], Laurey Arrow, MD   5 months ago Type 2 diabetes mellitus with other specified complication, without long-term current use of insulin [facility])   Primary [facility] at [facility], Laurey Arrow, MD      Future Appointments            In 1 week Shawnee Knapp, MD Primary Care at [place], [facility]

## 2018-07-30 MED ORDER — BUTORPHANOL TARTRATE 10 MG/ML NA SOLN: NASAL | 5 refills | Status: DC

## 2018-07-30 NOTE — Telephone Encounter (Signed)
Done. Pt notified in other mychart message.

## 2018-07-30 NOTE — Telephone Encounter (Signed)
Copied from [place] [phone]. Topic: Quick Communication - Rx Refill/Question >> Jul 28, 2018  1:17 PM Leward Quan A wrote: Medication: butorphanol (STADOL) 10 MG/ML nasal spray  Patient is out of medication and is worried that she may end up in the hospital if no one addresses this matter. Ph# [phone]   Has the patient contacted their pharmacy? Yes.     Preferred Pharmacy (with phone number or street name[id number] [place], [place] [phone] (Phone) [phone] (Fax)    Agent: Please be advised that RX refills may take up to 3 business days. We ask that you follow-up with your pharmacy.

## 2018-07-30 NOTE — Telephone Encounter (Signed)
Done. Pt notified in other mychart

## 2018-08-04 MED ORDER — OLMESARTAN MEDOXOMIL 20 MG PO TABS: 20.0000 mg | ORAL_TABLET | Freq: Every day | ORAL | 0 refills | Status: DC

## 2018-08-04 NOTE — Telephone Encounter (Signed)
Sent in 90d supply of olmesartan instead and sent pt MyChart note. Pt has OV sched 11/7 (in 3d) for recheck.  Sounds like pt was willing to try 1/2 tab of losartan 100 since pharm didn't have losartan 50 in stock and ins wouldn't cover losartan 25 bid but at this point there has been so much hastle getting it, I hope that we avoid further by changing to other arb.

## 2018-08-07 ENCOUNTER — Encounter: Payer: Self-pay | Admitting: Family Medicine

## 2018-08-07 ENCOUNTER — Other Ambulatory Visit: Payer: Self-pay

## 2018-08-07 ENCOUNTER — Ambulatory Visit (INDEPENDENT_AMBULATORY_CARE_PROVIDER_SITE_OTHER): Payer: Medicare Other | Admitting: Family Medicine

## 2018-08-07 VITALS — BP 150/80 | HR 90 | Temp 98.0°F | Resp 16 | Ht <= 58 in | Wt 150.0 lb

## 2018-08-07 DIAGNOSIS — E782 Mixed hyperlipidemia: Secondary | ICD-10-CM

## 2018-08-07 DIAGNOSIS — M25511 Pain in right shoulder: Secondary | ICD-10-CM | POA: Diagnosis not present

## 2018-08-07 DIAGNOSIS — Z1211 Encounter for screening for malignant neoplasm of colon: Secondary | ICD-10-CM

## 2018-08-07 DIAGNOSIS — E1129 Type 2 diabetes mellitus with other diabetic kidney complication: Secondary | ICD-10-CM | POA: Diagnosis not present

## 2018-08-07 DIAGNOSIS — N183 Chronic kidney disease, stage 3 unspecified: Secondary | ICD-10-CM

## 2018-08-07 DIAGNOSIS — Z79899 Other long term (current) drug therapy: Secondary | ICD-10-CM

## 2018-08-07 DIAGNOSIS — G8918 Other acute postprocedural pain: Secondary | ICD-10-CM | POA: Diagnosis not present

## 2018-08-07 DIAGNOSIS — G43809 Other migraine, not intractable, without status migrainosus: Secondary | ICD-10-CM

## 2018-08-07 DIAGNOSIS — Z1212 Encounter for screening for malignant neoplasm of rectum: Secondary | ICD-10-CM | POA: Diagnosis not present

## 2018-08-07 LAB — POCT URINALYSIS DIP (MANUAL ENTRY)
Bilirubin, UA: NEGATIVE
Blood, UA: NEGATIVE
GLUCOSE UA: NEGATIVE mg/dL
Ketones, POC UA: NEGATIVE mg/dL
Nitrite, UA: NEGATIVE
Spec Grav, UA: 1.02 (ref 1.010–1.025)
UROBILINOGEN UA: 0.2 U/dL
pH, UA: 6.5 (ref 5.0–8.0)

## 2018-08-07 LAB — POCT GLYCOSYLATED HEMOGLOBIN (HGB A1C): HEMOGLOBIN A1C: 10.3 % — AB (ref 4.0–5.6)

## 2018-08-07 MED ORDER — PHENOBARBITAL 97.2 MG PO TABS: ORAL_TABLET | ORAL | 1 refills | Status: DC

## 2018-08-07 MED ORDER — INSULIN NPH (HUMAN) (ISOPHANE) 100 UNIT/ML ~~LOC~~ SUSP: 5.0000 [IU] | Freq: Two times a day (BID) | SUBCUTANEOUS | 0 refills | Status: DC

## 2018-08-07 MED ORDER — ZOSTER VAC RECOMB ADJUVANTED 50 MCG/0.5ML IM SUSR: 0.5000 mL | Freq: Once | INTRAMUSCULAR | 0 refills | Status: AC

## 2018-08-07 MED ORDER — BUTORPHANOL TARTRATE 10 MG/ML NA SOLN: NASAL | 5 refills | Status: DC

## 2018-08-07 MED ORDER — LEVOTHYROXINE SODIUM 75 MCG PO TABS: 75.0000 ug | ORAL_TABLET | Freq: Every day | ORAL | 1 refills | Status: DC

## 2018-08-07 MED ORDER — TIZANIDINE HCL 4 MG PO TABS: ORAL_TABLET | ORAL | 1 refills | Status: DC

## 2018-08-07 MED ORDER — ATORVASTATIN CALCIUM 40 MG PO TABS: 40.0000 mg | ORAL_TABLET | Freq: Every day | ORAL | 1 refills | Status: DC

## 2018-08-07 MED ORDER — OLMESARTAN MEDOXOMIL 20 MG PO TABS: 20.0000 mg | ORAL_TABLET | Freq: Every day | ORAL | 0 refills | Status: DC

## 2018-08-07 NOTE — Progress Notes (Addendum)
Subjective:    Patient: Natalie [name] "Natalie [name]"  DOB: [birth year]; 67 y.o.   MRN: [id number]  Chief Complaint  Patient presents with  . Chronic Conditions    follow-up 3 month   . Medication Refill    HPI Last seen 3 mos prior   DM: a1c uncontrolled at 8.8 at last visit so started on bydureon Bcise as that was covered on insurance but then her copay was $465 and Januvia was also $400. Doesn't anticipate any difficulty sticking to a healthy diet over the holiday season.  Does check sugars some but not every day. CBGs fasting are around 240 and at night as well. Her daughter is very worried about pt starting on insulin.   HTN: was only able to get olmesartan yeaterday. Her granddaughter keeps her BP checked for her very frequently so will let olmesartan kick in - has been off of arb for > a month.   HLD: atorvastatin increased from 20 ot 40 at last visit as LDL above goal.  Chronic migraines: stadol, zanaflex  Recurrent UTIs: req hosp. seeing [facility] - has appt next Tuesday but conflicted with another appt.  Insomnia: takes zolpidem 1/2-1 tab qhs prn but most nights  Right shoulder still painful from surgery 02/18/18 - is going to go back to see ortho surgeron. Still taking tizanidine freq - often 4x/d as it is still pulling. Was on insulin when kidneys flaired so knows how.    Medical History Past Medical History:  Diagnosis Date  . Arthritis   . Atrophy of left kidney   . Cervicalgia   . Chronic kidney disease, stage 3 ([place])    acute aki 03-14-2018 in setting pyelonehritis-- resolved  . Chronic migraine   . Chronic pain syndrome   . History of kidney stones   . History of pyelonephritis    hx recurrent  . History of recurrent UTIs   . History of sepsis    07/ 2017 secondary to pyelonephritis  . Hypertension   . Hyperthyroidism   . Iron deficiency anemia   . Left ureteral stone   . Mixed hyperlipidemia   . Nephrolithiasis    right side nonobstructive per  CT 03-14-2018  . Right shoulder pain    post sx 02-18-2018  . Seizure disorder ([place]) followed by pcp   per pt dx age 63--  no seizure since 1980's , controlled w/ phenobarbitol  . Type 2 diabetes mellitus ([place])    followed by pcp  . Wears glasses    Past Surgical History:  Procedure Laterality Date  . CESAREAN SECTION  1974  . CHOLECYSTECTOMY OPEN  2002  . CYSTO/  LEFT RETROGRADE PYELOGRAM  06-11-2006   dr Jeffie Pollock  [facility]  . CYSTOSCOPY WITH RETROGRADE PYELOGRAM, URETEROSCOPY AND STENT PLACEMENT Left 04/10/2018   Procedure: CYSTOSCOPY WITH RETROGRADE PYELOGRAM, URETEROSCOPY AND STENT PLACEMENT;  Surgeon: Franchot Gallo, MD;  Location: [facility];  Service: Urology;  Laterality: Left;  . IR URETERAL STENT PLACEMENT EXISTING ACCESS RIGHT  08-21-2007    dr Karsten Ro  [facility]  . LEFT RETROGRADE PYELOGRAM/ LEFT URETERAL STENT PLACEMENT  02-25-2001   dr Jeffie Pollock [facility]  . NEPHROLITHOTOMY Right 05/14/2016   Procedure: NEPHROLITHOTOMY PERCUTANEOUS;  Surgeon: Franchot Gallo, MD;  Location: [facility];  Service: Urology;  Laterality: Right;  . SHOULDER ARTHROSCOPY Bilateral left 11-13-2000;  right 02-18-2018   Current Outpatient Medications on File Prior to Visit  Medication Sig Dispense Refill  . amLODipine (NORVASC) 5 MG tablet Take  1 tablet (5 mg total) by mouth daily. (Patient taking differently: Take 5 mg by mouth daily with supper. ) 90 tablet 3  . atorvastatin (LIPITOR) 40 MG tablet Take 1 tablet (40 mg total) by mouth daily at 6 PM. 90 tablet 1  . butorphanol (STADOL) 10 MG/ML nasal spray PLACE 1 SPRAY INTO THE NOSE EVERY 4 HOURS AS NEEDED FOR MIGRAINE. 2.5 mL 5  . estradiol (ESTRACE) 0.1 MG/GM vaginal cream Use 1 fingertip amount to urethra, clitoris, and vaginal introitus every night x 2 weeks, then decreased to 2-3x weekly 42.5 g 5  . Exenatide ER (BYDUREON BCISE) 2 MG/0.85ML AUIJ Inject 2 mg into the skin once a week. 4 pen 5  . glipiZIDE (GLUCOTROL) 10 MG tablet Take 2 tablets (20  mg total) by mouth 2 (two) times daily before a meal. 360 tablet 1  . levothyroxine (SYNTHROID, LEVOTHROID) 75 MCG tablet Take 1 tablet (75 mcg total) by mouth daily. (Patient taking differently: Take 75 mcg by mouth daily with supper. ) 90 tablet 1  . olmesartan (BENICAR) 20 MG tablet Take 1 tablet (20 mg total) by mouth daily. 90 tablet 0  . PHENobarbital (LUMINAL) 97.2 MG tablet TAKE 2 TABLETS BY MOUTH ONCE DAILY ON MON,WED,AND FRI AND 1 TABLET DAILY ON OTHER DAYS 135 tablet 1  . Semaglutide (OZEMPIC) 0.25 or 0.5 MG/DOSE SOPN Inject 0.25 mg into the skin once a week for 28 days, THEN 0.5 mg once a week. 2 mL 2  . tiZANidine (ZANAFLEX) 4 MG tablet TAKE 1 TABLET BY MOUTH EVERY 6 HRS AS NEEDED FOR MUSCLE SPASMS. AND TAKE EVERY NIGHT BEFORE BED 120 tablet 1  . Vaginal Lubricant (REPLENS) GEL Place 1 application vaginally 3 (three) times a week. 35 g 11  . vitamin B-12 1000 MCG tablet Take 1 tablet (1,000 mcg total) by mouth daily. 30 tablet 0  . zolpidem (AMBIEN) 10 MG tablet take 1 tablet by mouth at bedtime for sleep if needed. 90 tablet 1   No current facility-administered medications on file prior to visit.    Allergies  Allergen Reactions  . Compazine Other (See Comments)    Bad headache, vomiting   . Cymbalta [Duloxetine Hcl] Other (See Comments)    "Kept me awake"  . Methadone Hives  . Escitalopram Anxiety  . Macrobid [Nitrofurantoin Monohyd Macro] Rash   Family History  Problem Relation Age of Onset  . Heart disease Father   . Diabetes Father    Social History   Socioeconomic History  . Marital status: Married    Spouse name: Not on file  . Number of children: Not on file  . Years of education: Not on file  . Highest education level: Not on file  Occupational History  . Not on file  Social Needs  . Financial resource strain: Not on file  . Food insecurity:    Worry: Not on file    Inability: Not on file  . Transportation needs:    Medical: Not on file     Non-medical: Not on file  Tobacco Use  . Smoking status: Never Smoker  . Smokeless tobacco: Never Used  Substance and Sexual Activity  . Alcohol use: No    Alcohol/week: 0.0 standard drinks  . Drug use: No  . Sexual activity: Not on file  Lifestyle  . Physical activity:    Days per week: Not on file    Minutes per session: Not on file  . Stress: Not on file  Relationships  .  Social connections:    Talks on phone: Not on file    Gets together: Not on file    Attends religious service: Not on file    Active member of club or organization: Not on file    Attends meetings of clubs or organizations: Not on file    Relationship status: Not on file  Other Topics Concern  . Not on file  Social History Narrative  . Not on file   Depression screen [facility] 2/9 08/07/2018 05/05/2018 03/19/2018 03/03/2018 02/08/2018  Decreased Interest 0 0 0 0 0  Down, Depressed, Hopeless 0 0 0 0 0  PHQ - 2 Score 0 0 0 0 0    ROS As noted in HPI  Objective:  BP (!) 150/80   Pulse 90   Temp 98 F (36.7 C) (Oral)   Resp 16   Ht 4\' 8"  (1.422 m)   Wt 150 lb (68 kg)   SpO2 95%   BMI 33.63 kg/m  Physical Exam  Constitutional: She is oriented to person, place, and time. She appears well-developed and well-nourished. No distress.  HENT:  Head: Normocephalic and atraumatic.  Right Ear: External ear normal.  Left Ear: External ear normal.  Eyes: Conjunctivae are normal. No scleral icterus.  Neck: Normal range of motion. Neck supple. No thyromegaly present.  Cardiovascular: Normal rate, regular rhythm, normal heart sounds and intact distal pulses.  Pulmonary/Chest: Effort normal and breath sounds normal. No respiratory distress.  Musculoskeletal: She exhibits no edema.  Lymphadenopathy:    She has no cervical adenopathy.  Neurological: She is alert and oriented to person, place, and time.  Skin: Skin is warm and dry. She is not diaphoretic. No erythema.  Psychiatric: She has a normal mood and affect. Her  behavior is normal.    [facility] [facility] Office Visit on 08/07/2018  Component Date Value Ref Range Status  . Glucose 08/07/2018 208* 65 - 99 mg/dL Final  . BUN 08/07/2018 17  8 - 27 mg/dL Final  . Creatinine, Ser 08/07/2018 1.84* 0.57 - 1.00 mg/dL Final  . GFR calc non Af Amer 08/07/2018 28* >59 mL/min/1.73 Final  . GFR calc Af Amer 08/07/2018 32* >59 mL/min/1.73 Final  . BUN/Creatinine Ratio 08/07/2018 9* 12 - 28 Final  . Sodium 08/07/2018 141  134 - 144 mmol/L Final  . Potassium 08/07/2018 5.4* 3.5 - 5.2 mmol/L Final  . Chloride 08/07/2018 101  96 - 106 mmol/L Final  . CO2 08/07/2018 24  20 - 29 mmol/L Final  . Calcium 08/07/2018 12.2* 8.7 - 10.3 mg/dL Final  . Total Protein 08/07/2018 7.3  6.0 - 8.5 g/dL Final  . Albumin 08/07/2018 4.6  3.6 - 4.8 g/dL Final  . Globulin, Total 08/07/2018 2.7  1.5 - 4.5 g/dL Final  . Albumin/Globulin Ratio 08/07/2018 1.7  1.2 - 2.2 Final  . Bilirubin Total 08/07/2018 <0.2  0.0 - 1.2 mg/dL Final  . Alkaline Phosphatase 08/07/2018 93  39 - 117 IU/L Final  . AST 08/07/2018 36  0 - 40 IU/L Final  . ALT 08/07/2018 51* 0 - 32 IU/L Final  . Hemoglobin A1C 08/07/2018 10.3* 4.0 - 5.6 % Final  . Creatinine, Urine 08/07/2018 91.2  Not Estab. mg/dL Final  . Microalbumin, Urine 08/07/2018 76.4  Not Estab. ug/mL Final  . Microalb/Creat Ratio 08/07/2018 83.8* 0.0 - 30.0 mg/g creat Final   Comment:  Normal:                0.0 -  30.0                      Albuminuria:          31.0 - 300.0                      Clinical albuminuria:       >300.0   . Phenobarbital, Serum 08/07/2018 25  15 - 40 ug/mL Final                                   Detection Limit = 3  . Color, UA 08/07/2018 yellow  yellow Final  . Clarity, UA 08/07/2018 clear  clear Final  . Glucose, UA 08/07/2018 negative  negative mg/dL Final  . Bilirubin, UA 08/07/2018 negative  negative Final  . Ketones, POC UA 08/07/2018 negative  negative mg/dL Final  . Spec Grav, UA 08/07/2018  1.020  1.010 - 1.025 Corrected  . Blood, UA 08/07/2018 negative  negative Final  . pH, UA 08/07/2018 6.5  5.0 - 8.0 Corrected  . Protein Ur, POC 08/07/2018 =30* negative mg/dL Corrected  . Urobilinogen, UA 08/07/2018 0.2  0.2 or 1.0 E.U./dL Final  . Nitrite, UA 08/07/2018 Negative  Negative Final  . Leukocytes, UA 08/07/2018 Trace* Negative Corrected     Assessment & Plan:   1. Type 2 diabetes mellitus with other diabetic kidney complication, without long-term current use of insulin (HCC) - start insulin - will start w/ 5u NPH bid - encouraged pt that she maybe able to come off again in future when she can be more active and things improve but renal function limiting from many meds. Cont glipizide 20 bid 30 min before breakfast and supper. AT NEXT VISIT WILL NEED TO TITRATE UP INSULIN AND DECREASE GLIPIZIDE TO 10 BID DUE TO CrCl <50  2. Screening for colorectal cancer   3. Encounter for long-term (current) use of high-risk medication   4. Other migraine without status migrainosus, not intractable   5. Post-operative pain - having much more pain and lengthy recovery than expected - still f/u with ortho. Gets sig relief from tizanidine - only things that helps - so ok to cont qid for now.  6. Acute pain of right shoulder   7. Stage 3 chronic kidney disease ([place]) - restart olmesartan 20  8. Mixed hyperlipidemia - atorvastatin was increased from 20 ro 40 3 mos ago - fasting at next visit to recheck lipids    Patient will continue on current chronic medications other than changes noted above, so ok to refill when needed.   See after visit summary for patient specific instructions.  Orders Placed This Encounter  Procedures  . Comprehensive metabolic panel  . Microalbumin/Creatinine Ratio, Urine  . Phenobarbital level  . Cologuard  . POCT glycosylated hemoglobin (Hb A1C)  . POCT urinalysis dipstick    Meds ordered this encounter  Medications  . PHENobarbital (LUMINAL) 97.2 MG tablet     Sig: TAKE 2 TABLETS BY MOUTH ONCE DAILY ON MON,WED,AND FRI AND 1 TABLET DAILY ON OTHER DAYS    Dispense:  135 tablet    Refill:  1    Please fax to the [facility] in [place], [place] F: [phone]  . butorphanol (STADOL) 10 MG/ML nasal spray    Sig:  PLACE 1 SPRAY INTO THE NOSE EVERY 4 HOURS AS NEEDED FOR MIGRAINE.    Dispense:  2.5 mL    Refill:  5  . insulin NPH Human (HUMULIN N,NOVOLIN N) 100 UNIT/ML injection    Sig: Inject 0.05 mLs (5 Units total) into the skin 2 (two) times daily before a meal.    Dispense:  10 mL    Refill:  0  . Zoster Vaccine Adjuvanted [facility]) injection    Sig: Inject 0.5 mLs into the muscle once for 1 dose. Repeat once in 2-6 mos    Dispense:  0.5 mL    Refill:  0  . atorvastatin (LIPITOR) 40 MG tablet    Sig: Take 1 tablet (40 mg total) by mouth daily at 6 PM.    Dispense:  90 tablet    Refill:  1    Pt does not need currently. Please add this on as refills to current rx.  . levothyroxine (SYNTHROID, LEVOTHROID) 75 MCG tablet    Sig: Take 1 tablet (75 mcg total) by mouth daily.    Dispense:  90 tablet    Refill:  1  . olmesartan (BENICAR) 20 MG tablet    Sig: Take 1 tablet (20 mg total) by mouth daily.    Dispense:  90 tablet    Refill:  0    Pt does not need currently. Please add this on as refills to current rx.  Marland Kitchen tiZANidine (ZANAFLEX) 4 MG tablet    Sig: TAKE 1 TABLET BY MOUTH EVERY 6 HRS AS NEEDED FOR MUSCLE SPASMS. AND TAKE EVERY NIGHT BEFORE BED    Dispense:  120 tablet    Refill:  1    Patient verbalized to me that they understand the following: diagnosis, what is being done for them, what to expect and what should be done at home.  Their questions have been answered. They understand that I am unable to predict every possible medication interaction or adverse outcome and that if any unexpected symptoms arise, they should contact us and their pharmacist, as well as never hesitate to seek urgent/emergent care at [facility] or ER if they think it  might be warranted.    Delman Cheadle, MD, MPH [facility] [address] [place], [place]  [address] [phone] Office phone  [phone] Office fax  08/07/18 11:31 AM

## 2018-08-07 NOTE — Patient Instructions (Addendum)
If you have lab work done today you will be contacted with your lab results within the next 2 weeks.  If you have not heard from [place] then please contact us. The fastest way to get your results is to register for My Chart.   IF you received an x-ray today, you will receive an invoice from [facility]. Please contact [facility] at [phone] with questions or concerns regarding your invoice.   IF you received labwork today, you will receive an invoice from [facility]. Please contact [facility] at [phone] with questions or concerns regarding your invoice.   Our billing staff will not be able to assist you with questions regarding bills from these companies.  You will be contacted with the lab results as soon as they are available. The fastest way to get your results is to activate your My Chart account. Instructions are located on the last page of this paperwork. If you have not heard from [place] regarding the results in 2 weeks, please contact this office.       Insulin Storage and Care All insulin pens and bottles (vials) have expiration dates. Refrigerated, unopened insulin pens, insulin cartridges, and insulin vials are good until the expiration date. Do not use insulin after this date. Once insulin is opened, it should be used within a certain time period. Opened means once the rubber is punctured. Always follow the instructions that come with your insulin. Storing and caring for your insulin  If insulin is kept at room temperature, the temperature must be less than 45F (30C). Some types of insulin can be stored only at less than 69F (25C).  If insulin is kept in the refrigerator, the temperature must be between 67F and 84F (3C and 8C).  Do not freeze insulin.  Keep insulin away from direct heat or sunlight.  Throw away the insulin if it is discolored, thick, or has clumps or suspended white particles in it.  Be sure to mix cloudy insulin by rolling  between your hands gently. Pens can be "rocked" from end to end.  Opened insulin pens should be kept at room temperature.  Always have extra insulin on hand.  Never leave insulin in your vehicle. This information is not intended to replace advice given to you by your health care provider. Make sure you discuss any questions you have with your health care provider. Document Released: 07/15/2009 Document Revised: 02/23/2016 Document Reviewed: 02/05/2013 Elsevier Interactive Patient Education  2017 [place].  Insulin Injection Instructions, Single Insulin Dose, Adult A subcutaneous injection is a shot of medicine that is injected into the layer of fat between skin and muscle. People with type 1 diabetes must take insulin because their bodies do not make it. People with type 2 diabetes may need to take insulin. There are many different types of insulin. The type of insulin that you take may determine how many injections you give yourself and when you need to take the injections. Choosing a site for injection Insulin absorption varies from site to site. It is best to inject insulin within the same body area, using a different spot in that area for each injection. Do not inject the insulin in the same spot for each injection. There are five main areas that can be used for injecting. These areas include:  Abdomen. This is the preferred area.  Front of thigh.  Upper, outer side of thigh.  Back of upper arm.  Buttocks.  Using a syringe and vial: single  insulin dose First, follow the steps for Getting Ready, then continue with the steps for Pushing Air Into the [place], then follow the steps for Filling the Syringe, and finish with the steps for Injecting the Insulin. Getting Ready  1. Wash your hands with soap and water. If soap and water are not available, use hand sanitizer. 2. Check the expiration date and type of insulin. 3. If you are using CLEAR insulin, check to see that it is clear  and free of clumps. 4. Gently roll the medicine bottle (vial) between your palms to warm it. Do not shake the vial. 5. Remove the plastic pop-top covering from the vial, if one is present. 6. Use an alcohol wipe to clean the rubber top of the vial. 7. Remove the plastic cover from the needle on the syringe. Do not let the needle touch anything. Pushing Air Into the [place]  1. Pull the plunger back to bring (draw up) air into the syringe. The amount of air should be the same as the number of units in a dose of single insulin. 2. While you keep the vial right-side up, poke the needle through the rubber top of the vial. Do not turn the vial upside down to do this. 3. Push the plunger in all the way. This pushes air into the vial. 4. Do not take the needle out of the vial yet. Filling the Syringe 1. With the needle still inserted in the vial, turn the vial upside down and hold it at eye level. 2. Slowly pull back on the plunger to draw up the desired number of insulin units into the syringe. 3. If you see air bubbles in the syringe, slowly move the plunger up and down 2 or 3 times to get rid of them. 4. Pull back the plunger until the syringe is filled to the correct dose. 5. Remove the needle from the vial. Do not let the needle touch anything. Injecting the Insulin  1. Use an alcohol wipe to clean the site where you will be injecting the needle. Let the site air-dry. 2. Hold the syringe in your writing hand like a pencil. 3. Use your other hand to pinch and hold about an inch of skin. Do not directly touch the cleaned part of the skin. 4. Gently but quickly, put the needle straight into the skin. The needle should be at a 90-degree angle (perpendicular) to the skin, as if to form the letter "L." ? For example, if you are giving an injection in the abdomen, the abdomen forms one "leg" of the "L" and the needle forms the other "leg" of the "L." 5. For adults who have a small amount of body fat, the  needle may need to be injected at a 45-degree angle instead. Your health care provider will tell you if this is necessary. ? A 45-degree angle looks like the letter "V." 6. Push the needle in as far as it will go (to the hub). 7. When the needle is completely inserted into the skin, use the thumb of your writing hand to push the plunger down all the way to inject the insulin. 8. Let go of the skin that you are pinching. Continue to hold the syringe in place with your writing hand. 9. Wait 5 seconds, then pull the needle straight out of the skin. 10. Press and hold the alcohol wipe over the injection site until any bleeding stops. Do not rub the area. 11. Do not put the plastic  cover back on the needle. 12. Discard the syringe and needle directly into a sharps container, such as an empty plastic bottle with a cover. Throwing away supplies   Discard all used needles in a puncture-proof sharps disposal container. You can ask your local pharmacy about where you can get this kind of disposal container, or you can use an empty liquid laundry detergent bottle that has a cover.  Follow the disposal regulations for the area where you live. Do not use any syringe or needle more than one time.  Throw away empty vials in the regular trash. What questions should I ask my health care provider?  How often should I be taking insulin?  How often should I check my blood glucose?  What amount of insulin should I be taking at each time?  What are the side effects?  What should I do if my blood glucose is too high?  What should I do if my blood glucose is too low?  What should I do if I forget to take my insulin?  What number should I call if I have questions? Where can I get more information?  American Diabetes Association (ADA): www.diabetes.org  American Association of Diabetes Educators (AADE) Patient Resources: https://www.diabeteseducator.org/patient-resources This information is not intended  to replace advice given to you by your health care provider. Make sure you discuss any questions you have with your health care provider. Document Released: 10/21/2015 Document Revised: 02/23/2016 Document Reviewed: 10/21/2015 Elsevier Interactive Patient Education  2018 [place].  Insulin Treatment for Diabetes Diabetes (diabetes mellitus) is a long-term (chronic) disease. It occurs when the body does not properly use sugar (glucose) that is released from food after digestion. Glucose levels are controlled by a hormone called insulin, which is made in the pancreas.  If you have type 1 diabetes, the pancreas does not make any insulin, so you must take insulin.  If you have type 2 diabetes, you might need to take insulin along with other medicines. In type 2 diabetes, one or both of these problems may be present: ? The pancreas does not make enough insulin. ? Cells in the body do not respond properly to insulin that the body makes (insulin resistance).  You must use insulin correctly to control your diabetes. You must have some insulin in your body at all times. Insulin treatment varies depending on your type of diabetes, your treatment goals, and your medical history. It is important for you to understand your insulin treatment plan so you can be an active partner in managing your diabetes. How is insulin given? Insulin can only be given through a shot (injection). It is injected using a syringe and needle, an insulin pen, a pump, or a jet injector. Your health care provider will:  Prescribe the amount and type of insulin that you need.  Tell you when you should inject your insulin.  Where on the body should insulin be injected? Insulin is injected into a layer of fatty tissue under the skin. Good places to inject insulin include:  Abdomen. Generally, the abdomen is the best place to inject insulin. However, you should avoid any area that is less than 2 inches (5 cm) from the belly  button (navel).  Front and outer area of the upper thighs.  The back of the upper arms.  Upper buttocks.  It is important to:  Give your injection in a slightly different place each time. This helps to prevent irritation and improve absorption.  Avoid injecting into  areas that have scar tissue.  Usually, you will give yourself insulin injections. Others can also be taught how to give you injections. You will use a special type of syringe that is made only for insulin. Some people may have an insulin pump that delivers insulin steadily through a tube (cannula) that is placed under the skin. What are the different types of insulin? The following information is a general guide to different types of insulin. Specifics vary depending on the insulin product that your health care provider prescribes.  Rapid-acting insulin: ? Starts working quickly, in as little as 5 minutes. ? Can last for 4-6 hours, or sometimes longer. ? Works well when taken right before a meal to quickly lower blood glucose.  Short-acting insulin: ? Starts working in about 30 minutes. ? Can last for 6-10 hours. ? Should be taken about 30 minutes before you start eating a meal.  Intermediate-acting insulin: ? Starts working in 1-2 hours. ? Lasts for about 10-18 hours. ? Lowers your blood glucose for a longer period of time but is not as effective for lowering blood glucose right after a meal.  Long-acting insulin: ? Mimics the small amount of insulin that your pancreas usually produces throughout the day. ? Should be used either one or two times a day. ? Is usually used in combination with other types of insulin or other medicines.  Concentrated insulin, or U-500 insulin: ? Contains a higher dose of insulin than most rapid-acting insulins. U-500 insulin has 5 times the amount of insulin per 1 mL. ? Should only be used with the special U-500 syringe or U-500 insulin pen. It is dangerous to use the wrong type of  syringe with this insulin.  What are the side effects of insulin? Possible side effects of insulin treatment include:  Low blood glucose (hypoglycemia).  Weight gain.  High blood glucose (hyperglycemia).  Skin injury or irritation.  Some of these side effects can be caused by using improper injection technique. It is important to learn to inject insulin properly. What are common terms associated with insulin treatment? Some terms that you might hear include:  Basal insulin, or basal rate. This is the constant amount of insulin that needs to be present in your body to stabilize your blood glucose levels. People who have type 1 diabetes need basal insulin in a steady (continuous) dose 24 hours a day. ? Usually, intermediate-acting or long-acting insulin is used one or two times a day to manage basal insulin levels. ? Medicines that are taken by mouth may also be recommended to manage basal insulin levels.  Prandial insulin. This refers to meal-related insulin. ? Blood glucose rises quickly after a meal (postprandial). Rapid-acting or short-acting insulin can be used right before a meal (preprandial) to quickly lower blood glucose. ? You may be instructed to adjust the amount of prandial insulin that you take depending on how much carbohydrate (starch) is in your meal.  Corrective insulin. This may also be called a correction dose or supplemental dose. This is a small amount of rapid-acting or short-acting insulin that can be used to lower blood glucose if it is too high. You may be instructed to check your blood glucose at certain times of the day and use corrective insulin as needed.  Tight control, or intensive therapy. This means keeping your blood glucose as close to your target as possible, and preventing it from getting too high after meals. People who have tight control of their diabetes have fewer  long-term problems caused by diabetes.  General instructions  Talk with your health  care provider or pharmacist about the type of insulin you should take and when you should take it. You should know when your insulin peaks and when it wears off. You need this information so you can plan your meals and exercise. You also need to work with your health care provider to:  Check your blood glucose every day. Your health care provider will tell you how often and when you should do this.  Manage your: ? Weight. ? Blood pressure. ? Cholesterol. ? Stress.  Eat a healthy diet.  Exercise regularly.  This information is not intended to replace advice given to you by your health care provider. Make sure you discuss any questions you have with your health care provider. Document Released: 12/14/2008 Document Revised: 02/23/2016 Document Reviewed: 10/21/2015 Elsevier Interactive Patient Education  [employer].

## 2018-08-08 LAB — COMPREHENSIVE METABOLIC PANEL
A/G RATIO: 1.7 (ref 1.2–2.2)
ALK PHOS: 93 IU/L (ref 39–117)
ALT: 51 IU/L — ABNORMAL HIGH (ref 0–32)
AST: 36 IU/L (ref 0–40)
Albumin: 4.6 g/dL (ref 3.6–4.8)
BUN/Creatinine Ratio: 9 — ABNORMAL LOW (ref 12–28)
BUN: 17 mg/dL (ref 8–27)
CHLORIDE: 101 mmol/L (ref 96–106)
CO2: 24 mmol/L (ref 20–29)
Calcium: 12.2 mg/dL — ABNORMAL HIGH (ref 8.7–10.3)
Creatinine, Ser: 1.84 mg/dL — ABNORMAL HIGH (ref 0.57–1.00)
GFR calc Af Amer: 32 mL/min/{1.73_m2} — ABNORMAL LOW (ref >59)
GFR calc non Af Amer: 28 mL/min/{1.73_m2} — ABNORMAL LOW (ref >59)
GLOBULIN, TOTAL: 2.7 g/dL (ref 1.5–4.5)
Glucose: 208 mg/dL — ABNORMAL HIGH (ref 65–99)
POTASSIUM: 5.4 mmol/L — AB (ref 3.5–5.2)
SODIUM: 141 mmol/L (ref 134–144)
Total Protein: 7.3 g/dL (ref 6.0–8.5)

## 2018-08-08 LAB — MICROALBUMIN / CREATININE URINE RATIO
Creatinine, Urine: 91.2 mg/dL
MICROALBUM., U, RANDOM: 76.4 ug/mL
Microalb/Creat Ratio: 83.8 mg/g creat — ABNORMAL HIGH (ref 0.0–30.0)

## 2018-08-08 LAB — PHENOBARBITAL LEVEL: Phenobarbital, Serum: 25 ug/mL (ref 15–40)

## 2018-08-11 DIAGNOSIS — N202 Calculus of kidney with calculus of ureter: Secondary | ICD-10-CM | POA: Diagnosis not present

## 2018-08-14 ENCOUNTER — Telehealth: Payer: Self-pay

## 2018-08-14 NOTE — Telephone Encounter (Signed)
Pt was in the office today with her daughter and spoke with provider about a medication change.  Dr. Brigitte Pulse decided to double her olmesartan so pt will be taking 40 mg at this time.  Pt verbalized understanding.

## 2018-08-17 ENCOUNTER — Other Ambulatory Visit: Payer: Self-pay | Admitting: Family Medicine

## 2018-08-17 DIAGNOSIS — M25511 Pain in right shoulder: Secondary | ICD-10-CM

## 2018-08-17 DIAGNOSIS — G8918 Other acute postprocedural pain: Secondary | ICD-10-CM

## 2018-08-21 ENCOUNTER — Telehealth: Payer: Self-pay | Admitting: Family Medicine

## 2018-08-21 NOTE — Telephone Encounter (Signed)
Called and spoke with pt regarding their appt with Dr. Brigitte Pulse on 11/10/18. Due to Dr. Manya Silvas template change, appt time is pushed up by 10 minutes. I asked pt if this was acceptable and pt agreed. I advised pt of time, building number and late policy. Pt acknowledged.

## 2018-08-28 DIAGNOSIS — Z1212 Encounter for screening for malignant neoplasm of rectum: Secondary | ICD-10-CM | POA: Diagnosis not present

## 2018-08-28 DIAGNOSIS — Z1211 Encounter for screening for malignant neoplasm of colon: Secondary | ICD-10-CM | POA: Diagnosis not present

## 2018-09-02 LAB — COLOGUARD: COLOGUARD: NEGATIVE

## 2018-09-12 DIAGNOSIS — Z09 Encounter for follow-up examination after completed treatment for conditions other than malignant neoplasm: Secondary | ICD-10-CM | POA: Diagnosis not present

## 2018-09-20 DIAGNOSIS — M25511 Pain in right shoulder: Secondary | ICD-10-CM | POA: Diagnosis not present

## 2018-09-22 ENCOUNTER — Other Ambulatory Visit: Payer: Self-pay | Admitting: Family Medicine

## 2018-09-22 DIAGNOSIS — G8918 Other acute postprocedural pain: Secondary | ICD-10-CM

## 2018-09-22 DIAGNOSIS — M25511 Pain in right shoulder: Secondary | ICD-10-CM

## 2018-09-22 NOTE — Telephone Encounter (Signed)
Requested medication (s) are due for refill today: Yes  Requested medication (s) are on the active medication list: Yes  Last refill:  08/07/18  Future visit scheduled: Yes  Notes to clinic:  See request.    Requested Prescriptions  Pending Prescriptions Disp Refills   tiZANidine (ZANAFLEX) 4 MG tablet [Pharmacy Med Name: TIZANIDINE HCL 4 MG TABLET] 120 tablet 1    Sig: TAKE 1 TABLET BY MOUTH EVERY 6 HOURS AS NEEDED FOR MUSCLE SPASMS. AND TAKE EVERY NIGHT BEFORE BED     Not Delegated - Cardiovascular:  Alpha-2 Agonists - tizanidine Failed - 09/22/2018  3:02 AM      Failed - This refill cannot be delegated      Passed - Valid encounter within last 6 months    Recent Outpatient Visits          1 month ago Type 2 diabetes mellitus with other diabetic kidney complication, without long-term current use of insulin ([place])   [facility], Laurey Arrow, MD   4 months ago Type 2 diabetes mellitus without complication, without long-term current use of insulin [facility])   [facility] at [facility], Laurey Arrow, MD   5 months ago Renal stone   Primary Care at [place], PA-C   6 months ago Recurrent UTI   [facility], Laurey Arrow, MD   6 months ago Migraine, chronic, without aura, intractable, with status migrainosus   [facility], Laurey Arrow, MD      Future Appointments            In 3 weeks Shawnee Knapp, MD Primary Care at [place], [facility]   In 1 month Shawnee Knapp, MD Primary Care at [place], [facility]

## 2018-09-26 DIAGNOSIS — M67911 Unspecified disorder of synovium and tendon, right shoulder: Secondary | ICD-10-CM | POA: Diagnosis not present

## 2018-09-27 MED ORDER — OLMESARTAN MEDOXOMIL 40 MG PO TABS: 40.0000 mg | ORAL_TABLET | Freq: Every day | ORAL | 0 refills | Status: DC

## 2018-09-27 NOTE — Addendum Note (Signed)
Addended by: Shawnee Knapp on: 09/27/2018 06:42 AM   Modules accepted: Orders

## 2018-09-27 NOTE — Addendum Note (Signed)
Addended by: Shawnee Knapp on: 09/27/2018 02:13 AM   Modules accepted: Orders

## 2018-09-27 NOTE — Addendum Note (Signed)
Addended by: Shawnee Knapp on: 09/27/2018 02:01 AM   Modules accepted: Orders

## 2018-10-07 ENCOUNTER — Telehealth: Payer: Self-pay | Admitting: Family Medicine

## 2018-10-07 NOTE — Telephone Encounter (Signed)
MyChart message sen to pt about her appointment with Dr Brigitte Pulse on 10/13/18

## 2018-10-08 ENCOUNTER — Other Ambulatory Visit: Payer: Self-pay

## 2018-10-08 ENCOUNTER — Ambulatory Visit (INDEPENDENT_AMBULATORY_CARE_PROVIDER_SITE_OTHER): Payer: Medicare Other | Admitting: Family Medicine

## 2018-10-08 DIAGNOSIS — N183 Chronic kidney disease, stage 3 unspecified: Secondary | ICD-10-CM

## 2018-10-08 DIAGNOSIS — E1129 Type 2 diabetes mellitus with other diabetic kidney complication: Secondary | ICD-10-CM | POA: Diagnosis not present

## 2018-10-08 DIAGNOSIS — E782 Mixed hyperlipidemia: Secondary | ICD-10-CM

## 2018-10-08 DIAGNOSIS — Z79899 Other long term (current) drug therapy: Secondary | ICD-10-CM

## 2018-10-08 DIAGNOSIS — G43809 Other migraine, not intractable, without status migrainosus: Secondary | ICD-10-CM

## 2018-10-08 MED ORDER — BUTORPHANOL TARTRATE 10 MG/ML NA SOLN: NASAL | 5 refills | Status: DC

## 2018-10-08 NOTE — Telephone Encounter (Signed)
Sent in by e-rx (accidentally printed intially which can be discarded).

## 2018-10-08 NOTE — Progress Notes (Signed)
Lab Only Visit. Did not see a provider.

## 2018-10-08 NOTE — Addendum Note (Signed)
Addended by: Shawnee Knapp on: 10/08/2018 06:48 PM   Modules accepted: Orders

## 2018-10-08 NOTE — Telephone Encounter (Signed)
I have called pt back for a lab visit only, and she had requested to get a medication refill on her Stadol to help with her migraines. She stated that she uses it every week and only has 1 left.   Please advise on refill.  Thanks, [employer]

## 2018-10-09 LAB — CBC WITH DIFFERENTIAL/PLATELET
BASOS: 1 %
Basophils Absolute: 0.1 10*3/uL (ref 0.0–0.2)
EOS (ABSOLUTE): 0.2 10*3/uL (ref 0.0–0.4)
EOS: 2 %
Hematocrit: 33.6 % — ABNORMAL LOW (ref 34.0–46.6)
Hemoglobin: 11 g/dL — ABNORMAL LOW (ref 11.1–15.9)
Immature Grans (Abs): 0 10*3/uL (ref 0.0–0.1)
Immature Granulocytes: 0 %
Lymphocytes Absolute: 1.6 10*3/uL (ref 0.7–3.1)
Lymphs: 21 %
MCH: 30.4 pg (ref 26.6–33.0)
MCHC: 32.7 g/dL (ref 31.5–35.7)
MCV: 93 fL (ref 79–97)
Monocytes Absolute: 0.8 10*3/uL (ref 0.1–0.9)
Monocytes: 10 %
Neutrophils Absolute: 5.1 10*3/uL (ref 1.4–7.0)
Neutrophils: 66 %
Platelets: 278 10*3/uL (ref 150–450)
RBC: 3.62 x10E6/uL — ABNORMAL LOW (ref 3.77–5.28)
RDW: 13.1 % (ref 11.7–15.4)
WBC: 7.7 10*3/uL (ref 3.4–10.8)

## 2018-10-09 LAB — BASIC METABOLIC PANEL
BUN/Creatinine Ratio: 19 (ref 12–28)
BUN: 42 mg/dL — ABNORMAL HIGH (ref 8–27)
CO2: 21 mmol/L (ref 20–29)
Calcium: 9.7 mg/dL (ref 8.7–10.3)
Chloride: 104 mmol/L (ref 96–106)
Creatinine, Ser: 2.19 mg/dL — ABNORMAL HIGH (ref 0.57–1.00)
GFR calc Af Amer: 26 mL/min/{1.73_m2} — ABNORMAL LOW (ref >59)
GFR calc non Af Amer: 22 mL/min/{1.73_m2} — ABNORMAL LOW (ref >59)
Glucose: 168 mg/dL — ABNORMAL HIGH (ref 65–99)
Potassium: 5.2 mmol/L (ref 3.5–5.2)
Sodium: 140 mmol/L (ref 134–144)

## 2018-10-09 LAB — LIPID PANEL
Chol/HDL Ratio: 2.1 ratio (ref 0.0–4.4)
Cholesterol, Total: 159 mg/dL (ref 100–199)
HDL: 75 mg/dL (ref >39)
LDL CALC: 65 mg/dL (ref 0–99)
Triglycerides: 94 mg/dL (ref 0–149)
VLDL Cholesterol Cal: 19 mg/dL (ref 5–40)

## 2018-10-09 LAB — FRUCTOSAMINE: Fructosamine: 359 umol/L — ABNORMAL HIGH (ref 0–285)

## 2018-10-09 NOTE — Telephone Encounter (Signed)
Called the pt and informed her that her medication has been sent into the pharmacy.  She stated understanding.

## 2018-10-10 DIAGNOSIS — E785 Hyperlipidemia, unspecified: Secondary | ICD-10-CM | POA: Diagnosis not present

## 2018-10-10 DIAGNOSIS — M12811 Other specific arthropathies, not elsewhere classified, right shoulder: Secondary | ICD-10-CM | POA: Diagnosis not present

## 2018-10-10 DIAGNOSIS — R51 Headache: Secondary | ICD-10-CM | POA: Diagnosis not present

## 2018-10-10 DIAGNOSIS — N200 Calculus of kidney: Secondary | ICD-10-CM | POA: Diagnosis not present

## 2018-10-10 DIAGNOSIS — I10 Essential (primary) hypertension: Secondary | ICD-10-CM | POA: Diagnosis not present

## 2018-10-10 DIAGNOSIS — E039 Hypothyroidism, unspecified: Secondary | ICD-10-CM | POA: Diagnosis not present

## 2018-10-10 DIAGNOSIS — E669 Obesity, unspecified: Secondary | ICD-10-CM | POA: Diagnosis not present

## 2018-10-10 DIAGNOSIS — E119 Type 2 diabetes mellitus without complications: Secondary | ICD-10-CM | POA: Diagnosis not present

## 2018-10-10 DIAGNOSIS — R569 Unspecified convulsions: Secondary | ICD-10-CM | POA: Diagnosis not present

## 2018-10-10 DIAGNOSIS — G4700 Insomnia, unspecified: Secondary | ICD-10-CM | POA: Diagnosis not present

## 2018-10-13 ENCOUNTER — Ambulatory Visit: Payer: Medicare Other | Admitting: Family Medicine

## 2018-10-14 ENCOUNTER — Other Ambulatory Visit: Payer: Self-pay | Admitting: Family Medicine

## 2018-10-15 ENCOUNTER — Encounter: Payer: Self-pay | Admitting: Family Medicine

## 2018-10-16 ENCOUNTER — Other Ambulatory Visit: Payer: Self-pay | Admitting: Family Medicine

## 2018-10-17 ENCOUNTER — Telehealth: Payer: Self-pay | Admitting: Family Medicine

## 2018-10-17 MED ORDER — INSULIN NPH (HUMAN) (ISOPHANE) 100 UNIT/ML ~~LOC~~ SUSP: 5.0000 [IU] | Freq: Two times a day (BID) | SUBCUTANEOUS | 0 refills | Status: DC

## 2018-10-17 NOTE — Telephone Encounter (Signed)
Insulin syringes not on active med list

## 2018-10-17 NOTE — Telephone Encounter (Signed)
Copied from [place] [phone]. Topic: Quick Communication - Rx Refill/Question >> Oct 17, 2018  9:32 AM Selinda Flavin B, NT wrote: Medication: Insulin syringe needles 39mm and 31g  Has the patient contacted their pharmacy? Yes.   (Agent: If no, request that the patient contact the pharmacy for the refill.) (Agent: If yes, when and what did the pharmacy advise?)  Preferred Pharmacy (with phone number or street name): [id number] [place], [place]: Please be advised that RX refills may take up to 3 business days. We ask that you follow-up with your pharmacy.

## 2018-10-17 NOTE — Telephone Encounter (Signed)
pls see note 

## 2018-10-20 ENCOUNTER — Other Ambulatory Visit: Payer: Self-pay | Admitting: Family Medicine

## 2018-10-20 MED ORDER — INSULIN NPH (HUMAN) (ISOPHANE) 100 UNIT/ML ~~LOC~~ SUSP: 5.0000 [IU] | Freq: Two times a day (BID) | SUBCUTANEOUS | 0 refills | Status: DC

## 2018-10-20 NOTE — Telephone Encounter (Signed)
Last OV with PCP in nov 2019 Med refilled Has upcoming appt feb 2020

## 2018-11-02 ENCOUNTER — Other Ambulatory Visit: Payer: Self-pay | Admitting: Family Medicine

## 2018-11-03 ENCOUNTER — Other Ambulatory Visit: Payer: Self-pay | Admitting: Family Medicine

## 2018-11-03 DIAGNOSIS — R809 Proteinuria, unspecified: Principal | ICD-10-CM

## 2018-11-03 DIAGNOSIS — Z794 Long term (current) use of insulin: Secondary | ICD-10-CM

## 2018-11-03 DIAGNOSIS — G8918 Other acute postprocedural pain: Secondary | ICD-10-CM

## 2018-11-03 DIAGNOSIS — M25511 Pain in right shoulder: Secondary | ICD-10-CM

## 2018-11-03 DIAGNOSIS — N184 Chronic kidney disease, stage 4 (severe): Secondary | ICD-10-CM

## 2018-11-03 DIAGNOSIS — E1129 Type 2 diabetes mellitus with other diabetic kidney complication: Secondary | ICD-10-CM

## 2018-11-03 DIAGNOSIS — IMO0001 Reserved for inherently not codable concepts without codable children: Secondary | ICD-10-CM

## 2018-11-03 MED ORDER — OLMESARTAN MEDOXOMIL 40 MG PO TABS: 40.0000 mg | ORAL_TABLET | Freq: Every day | ORAL | 0 refills | Status: DC

## 2018-11-03 NOTE — Telephone Encounter (Signed)
Pt advised that bp medication has been sent to the pharmacy, still awaiting on response regarding refill on Zanaflex.

## 2018-11-03 NOTE — Telephone Encounter (Signed)
Med request. Last filled 09/27/18. Follow up on 11/13/2018 @11 :30 am

## 2018-11-03 NOTE — Telephone Encounter (Signed)
Requested medication (s) are due for refill today: yes  Requested medication (s) are on the active medication list: yes  Last refill:  09/27/18  Future visit scheduled: yes  Notes to clinic:  Not delegated    Requested Prescriptions  Pending Prescriptions Disp Refills   tiZANidine (ZANAFLEX) 4 MG tablet 120 tablet 1    Sig: TAKE 1 TABLET BY MOUTH EVERY 6 HOURS AS NEEDED FOR MUSCLE SPASMS. AND TAKE EVERY NIGHT BEFORE BED     Not Delegated - Cardiovascular:  Alpha-2 Agonists - tizanidine Failed - 11/03/2018 11:49 AM      Failed - This refill cannot be delegated      Passed - Valid encounter within last 6 months    Recent Outpatient Visits          3 weeks ago Stage 3 chronic kidney disease ([place])   Primary Care at [facility], Zoe A, MD   2 months ago Type 2 diabetes mellitus with other diabetic kidney complication, without long-term current use of insulin [facility])   [facility] at [facility], Laurey Arrow, MD   6 months ago Type 2 diabetes mellitus without complication, without long-term current use of insulin [facility])   [facility] at [facility], Laurey Arrow, MD   6 months ago Renal stone   [facility] at [facility], Damaris Hippo, PA-C   7 months ago Recurrent UTI   [facility] at [facility], Laurey Arrow, MD      Future Appointments            In 1 week Shawnee Knapp, MD Primary Care at [place], [facility]         Signed Prescriptions Disp Refills   olmesartan (BENICAR) 40 MG tablet 90 tablet 0    Sig: Take 1 tablet (40 mg total) by mouth daily.     Cardiovascular:  Angiotensin Receptor Blockers Failed - 11/03/2018 11:49 AM      Failed - Cr in normal range and within 180 days    Creat  Date Value Ref Range Status  07/12/2016 1.13 (H) 0.50 - 0.99 mg/dL Final    Comment:      For patients > or = 70 years of age: The upper reference limit for Creatinine is approximately 13% higher for people identified as African-American.      Creatinine, Ser  Date Value Ref Range Status  10/08/2018  2.19 (H) 0.57 - 1.00 mg/dL Final         Failed - Last BP in normal range    BP Readings from Last 1 Encounters:  08/07/18 (!) 150/80         Passed - K in normal range and within 180 days    Potassium  Date Value Ref Range Status  10/08/2018 5.2 3.5 - 5.2 mmol/L Final         Passed - Patient is not pregnant      Passed - Valid encounter within last 6 months    Recent Outpatient Visits          3 weeks ago Stage 3 chronic kidney disease ([place])   Primary Care at [place], MD   2 months ago Type 2 diabetes mellitus with other diabetic kidney complication, without long-term current use of insulin [facility])   [facility] at [facility], Laurey Arrow, MD   6 months ago Type 2 diabetes mellitus without complication, without long-term current use of insulin [facility])   Primary Care at [facility],  Laurey Arrow, MD   6 months ago Renal stone   Primary Care at [place], PA-C   7 months ago Recurrent UTI   [facility] at [facility], Laurey Arrow, MD      Future Appointments            In 1 week [name] Arrow, MD Primary Care at [place], [facility]

## 2018-11-03 NOTE — Telephone Encounter (Signed)
Requested Prescriptions  Pending Prescriptions Disp Refills  . tiZANidine (ZANAFLEX) 4 MG tablet 120 tablet 1    Sig: TAKE 1 TABLET BY MOUTH EVERY 6 HOURS AS NEEDED FOR MUSCLE SPASMS. AND TAKE EVERY NIGHT BEFORE BED     Not Delegated - Cardiovascular:  Alpha-2 Agonists - tizanidine Failed - 11/03/2018 11:49 AM      Failed - This refill cannot be delegated      Passed - Valid encounter within last 6 months    Recent Outpatient Visits          3 weeks ago Stage 3 chronic kidney disease ([place])   Primary Care at [facility], Zoe A, MD   2 months ago Type 2 diabetes mellitus with other diabetic kidney complication, without long-term current use of insulin [facility])   [facility] at [facility], Laurey Arrow, MD   6 months ago Type 2 diabetes mellitus without complication, without long-term current use of insulin [facility])   Primary [facility] at [facility], Laurey Arrow, MD   6 months ago Renal stone   Primary Care at [place], PA-C   7 months ago Recurrent UTI   [facility] at [facility], Laurey Arrow, MD      Future Appointments            In 1 week Shawnee Knapp, MD Primary Care at [place], [facility]         . olmesartan (BENICAR) 40 MG tablet 90 tablet 0    Sig: Take 1 tablet (40 mg total) by mouth daily.     Cardiovascular:  Angiotensin Receptor Blockers Failed - 11/03/2018 11:49 AM      Failed - Cr in normal range and within 180 days    Creat  Date Value Ref Range Status  07/12/2016 1.13 (H) 0.50 - 0.99 mg/dL Final    Comment:      For patients > or = 70 years of age: The upper reference limit for Creatinine is approximately 13% higher for people identified as African-American.      Creatinine, Ser  Date Value Ref Range Status  10/08/2018 2.19 (H) 0.57 - 1.00 mg/dL Final         Failed - Last BP in normal range    BP Readings from Last 1 Encounters:  08/07/18 (!) 150/80         Passed - K in normal range and within 180 days    Potassium  Date Value Ref Range Status  10/08/2018 5.2  3.5 - 5.2 mmol/L Final         Passed - Patient is not pregnant      Passed - Valid encounter within last 6 months    Recent Outpatient Visits          3 weeks ago Stage 3 chronic kidney disease ([place])   Primary Care at [facility], Zoe A, MD   2 months ago Type 2 diabetes mellitus with other diabetic kidney complication, without long-term current use of insulin [facility])   [facility] at [facility], Laurey Arrow, MD   6 months ago Type 2 diabetes mellitus without complication, without long-term current use of insulin [facility])   [facility] at [facility], Laurey Arrow, MD   6 months ago Renal stone   Primary Care at [place], PA-C   7 months ago Recurrent UTI   [facility] at [facility], Laurey Arrow, MD      Future Appointments  In 1 week Shawnee Knapp, MD Primary Care at [place], [facility]

## 2018-11-03 NOTE — Telephone Encounter (Signed)
Copied from [place] [id number]. Topic: Quick Communication - Rx Refill/Question >> Nov 03, 2018  9:54 AM Parke Poisson wrote: Medication:tiZANidine (ZANAFLEX) 4 MG tablet  2) olmesartan (BENICAR) 40 MG tablet  Has the patient contacted their pharmacy She states pharmacy has reached out several times (Agent: If no, request that the patient contact the pharmacy for the refill.) (Agent: If yes, when and what did the pharmacy advise?)  Preferred Pharmacy (with phone number or street name): [id number] [address], [place] [phone] (Phone) [phone] (Fax)    Agent: Please be advised that RX refills may take up to 3 business days. We ask that you follow-up with your pharmacy.

## 2018-11-09 ENCOUNTER — Other Ambulatory Visit: Payer: Self-pay | Admitting: Family Medicine

## 2018-11-09 DIAGNOSIS — G8918 Other acute postprocedural pain: Secondary | ICD-10-CM

## 2018-11-09 DIAGNOSIS — M25511 Pain in right shoulder: Secondary | ICD-10-CM

## 2018-11-10 ENCOUNTER — Ambulatory Visit: Payer: Medicare Other | Admitting: Family Medicine

## 2018-11-10 ENCOUNTER — Encounter: Payer: Self-pay | Admitting: Family Medicine

## 2018-11-10 ENCOUNTER — Other Ambulatory Visit: Payer: Self-pay | Admitting: Family Medicine

## 2018-11-10 DIAGNOSIS — G8929 Other chronic pain: Secondary | ICD-10-CM

## 2018-11-10 DIAGNOSIS — M25511 Pain in right shoulder: Secondary | ICD-10-CM

## 2018-11-10 DIAGNOSIS — G8918 Other acute postprocedural pain: Secondary | ICD-10-CM

## 2018-11-10 DIAGNOSIS — M542 Cervicalgia: Secondary | ICD-10-CM

## 2018-11-10 DIAGNOSIS — G43711 Chronic migraine without aura, intractable, with status migrainosus: Secondary | ICD-10-CM

## 2018-11-10 DIAGNOSIS — G894 Chronic pain syndrome: Secondary | ICD-10-CM

## 2018-11-10 MED ORDER — "INSULIN SYRINGE 30G X 5/16"" 1 ML MISC": 1.0000 [IU] | 3 refills | Status: DC

## 2018-11-10 MED ORDER — "INSULIN SYRINGE 31G X 5/16"" 1 ML MISC": 1.0000 [IU] | 3 refills | Status: DC

## 2018-11-10 MED ORDER — TIZANIDINE HCL 4 MG PO TABS: 4.0000 mg | ORAL_TABLET | ORAL | 1 refills | Status: DC | PRN

## 2018-11-10 NOTE — Telephone Encounter (Signed)
Requested medication (s) are due for refill today: yes  Requested medication (s) are on the active medication list: yes  Last refill:  09/27/18 for 120 tabs  Future visit scheduled: yes  Notes to clinic:  Appointment scheduled with Dr. Brigitte Pulse on 11/13/2018. Med can not be delegated.  Requested Prescriptions  Pending Prescriptions Disp Refills   tiZANidine (ZANAFLEX) 4 MG tablet [Pharmacy Med Name: TIZANIDINE HCL 4 MG TABLET] 120 tablet 1    Sig: TAKE 1 TABLET BY MOUTH EVERY 6 HOURS AS NEEDED FOR MUSCLE SPASMS. AND TAKE EVERY NIGHT BEFORE BED     Not Delegated - Cardiovascular:  Alpha-2 Agonists - tizanidine Failed - 11/09/2018  1:22 AM      Failed - This refill cannot be delegated      Passed - Valid encounter within last 6 months    Recent Outpatient Visits          1 month ago Stage 3 chronic kidney disease ([place])   Primary Care at [facility], Zoe A, MD   3 months ago Type 2 diabetes mellitus with other diabetic kidney complication, without long-term current use of insulin [facility])   [facility] at [facility], Laurey Arrow, MD   6 months ago Type 2 diabetes mellitus without complication, without long-term current use of insulin [facility])   [facility] at [facility], Laurey Arrow, MD   7 months ago Renal stone   [facility] at [facility], PA-C   7 months ago Recurrent UTI   [facility] at [facility], Laurey Arrow, MD      Future Appointments            In 3 days Shawnee Knapp, MD Primary Care at [place], [facility]

## 2018-11-10 NOTE — Addendum Note (Signed)
Addended by: Shawnee Knapp on: 11/10/2018 03:45 PM   Modules accepted: Orders

## 2018-11-10 NOTE — Telephone Encounter (Signed)
pls see note 

## 2018-11-10 NOTE — Telephone Encounter (Signed)
Refill already in other encounter

## 2018-11-10 NOTE — Progress Notes (Signed)
Referred to pain management AND to [facility]

## 2018-11-10 NOTE — Telephone Encounter (Signed)
Sent in insulin needles/syringe combo  Meds ordered this encounter  Medications  . DISCONTD: Insulin Syringe-Needle U-100 (INSULIN SYRINGE 1CC/30GX5/16") 30G X 5/16" 1 ML MISC    Sig: 1 Units by Does not apply route as directed.    Dispense:  200 each    Refill:  3  . DISCONTD: Insulin Syringe-Needle U-100 (INSULIN SYRINGE 1CC/30GX5/16") 30G X 5/16" 1 ML MISC    Sig: 1 Units by Does not apply route as directed. Dx: E11.29    Dispense:  200 each    Refill:  3  . Insulin Syringe-Needle U-100 (INSULIN SYRINGE 1CC/31GX5/16") 31G X 5/16" 1 ML MISC    Sig: 1 Units by Does not apply route as directed. Dx: E11.29    Dispense:  200 each    Refill:  3    Pt prefers 31g so please d/c 30g rx if 31g is same price

## 2018-11-12 ENCOUNTER — Encounter: Payer: Self-pay | Admitting: Family Medicine

## 2018-11-13 ENCOUNTER — Ambulatory Visit: Payer: Medicare Other | Admitting: Family Medicine

## 2018-11-15 ENCOUNTER — Other Ambulatory Visit: Payer: Self-pay | Admitting: Family Medicine

## 2018-11-16 ENCOUNTER — Other Ambulatory Visit: Payer: Self-pay | Admitting: Family Medicine

## 2018-11-17 NOTE — Telephone Encounter (Signed)
Requested Prescriptions  Pending Prescriptions Disp Refills  . glipiZIDE (GLUCOTROL) 10 MG tablet [Pharmacy Med Name: GLIPIZIDE 10 MG TABLET] 360 tablet 0    Sig: TAKE 2 TABLETS (20 MG TOTAL) BY MOUTH 2 (TWO) TIMES DAILY BEFORE A MEAL.     Endocrinology:  Diabetes - Sulfonylureas Failed - 11/15/2018  7:35 PM      Failed - HBA1C is between 0 and 7.9 and within 180 days    Hemoglobin A1C  Date Value Ref Range Status  08/07/2018 10.3 (A) 4.0 - 5.6 % Final   Hgb A1c MFr Bld  Date Value Ref Range Status  12/27/2016 6.9 (H) 4.8 - 5.6 % Final    Comment:             Pre-diabetes: 5.7 - 6.4          Diabetes: >6.4          Glycemic control for adults with diabetes: <7.0          Passed - Valid encounter within last 6 months    Recent Outpatient Visits          1 month ago Stage 3 chronic kidney disease ([place])   Primary Care at [facility], [place], MD   3 months ago Type 2 diabetes mellitus with other diabetic kidney complication, without long-term current use of insulin [facility])   [facility] at [facility], Laurey Arrow, MD   6 months ago Type 2 diabetes mellitus without complication, without long-term current use of insulin [facility])   [facility] at [facility], Laurey Arrow, MD   7 months ago Renal stone   [facility] at [facility], Damaris Hippo, PA-C   8 months ago Recurrent UTI   [facility] at [facility], Laurey Arrow, MD      Future Appointments            In 1 month Forrest Moron, MD Primary Care at [place], [facility]

## 2018-11-25 ENCOUNTER — Encounter: Payer: Self-pay | Admitting: Internal Medicine

## 2018-11-26 ENCOUNTER — Encounter: Payer: Self-pay | Admitting: Family Medicine

## 2018-12-01 ENCOUNTER — Encounter (HOSPITAL_COMMUNITY): Payer: Self-pay

## 2018-12-01 ENCOUNTER — Emergency Department (HOSPITAL_COMMUNITY)
Admission: EM | Admit: 2018-12-01 | Discharge: 2018-12-01 | Disposition: A | Discharge status: Home / Self Care | Payer: Medicare Other | Attending: Emergency Medicine | Admitting: Emergency Medicine

## 2018-12-01 ENCOUNTER — Encounter: Payer: Self-pay | Admitting: Family Medicine

## 2018-12-01 ENCOUNTER — Ambulatory Visit (INDEPENDENT_AMBULATORY_CARE_PROVIDER_SITE_OTHER): Payer: Medicare Other | Admitting: Family Medicine

## 2018-12-01 ENCOUNTER — Emergency Department (HOSPITAL_COMMUNITY): Payer: Medicare Other

## 2018-12-01 ENCOUNTER — Other Ambulatory Visit: Payer: Self-pay

## 2018-12-01 VITALS — BP 80/62 | HR 55 | Temp 98.0°F | Resp 16 | Ht <= 58 in | Wt 161.0 lb

## 2018-12-01 DIAGNOSIS — N186 End stage renal disease: Secondary | ICD-10-CM | POA: Diagnosis not present

## 2018-12-01 DIAGNOSIS — Z794 Long term (current) use of insulin: Secondary | ICD-10-CM

## 2018-12-01 DIAGNOSIS — E039 Hypothyroidism, unspecified: Secondary | ICD-10-CM | POA: Diagnosis not present

## 2018-12-01 DIAGNOSIS — E1122 Type 2 diabetes mellitus with diabetic chronic kidney disease: Secondary | ICD-10-CM | POA: Insufficient documentation

## 2018-12-01 DIAGNOSIS — Z79899 Other long term (current) drug therapy: Secondary | ICD-10-CM | POA: Diagnosis not present

## 2018-12-01 DIAGNOSIS — R001 Bradycardia, unspecified: Secondary | ICD-10-CM

## 2018-12-01 DIAGNOSIS — I9589 Other hypotension: Secondary | ICD-10-CM | POA: Diagnosis not present

## 2018-12-01 DIAGNOSIS — E0822 Diabetes mellitus due to underlying condition with diabetic chronic kidney disease: Secondary | ICD-10-CM | POA: Diagnosis not present

## 2018-12-01 DIAGNOSIS — N183 Chronic kidney disease, stage 3 (moderate): Secondary | ICD-10-CM | POA: Diagnosis not present

## 2018-12-01 DIAGNOSIS — Z79891 Long term (current) use of opiate analgesic: Secondary | ICD-10-CM

## 2018-12-01 DIAGNOSIS — I129 Hypertensive chronic kidney disease with stage 1 through stage 4 chronic kidney disease, or unspecified chronic kidney disease: Secondary | ICD-10-CM | POA: Diagnosis not present

## 2018-12-01 DIAGNOSIS — F1193 Opioid use, unspecified with withdrawal: Secondary | ICD-10-CM

## 2018-12-01 DIAGNOSIS — D72829 Elevated white blood cell count, unspecified: Secondary | ICD-10-CM | POA: Diagnosis not present

## 2018-12-01 DIAGNOSIS — R7989 Other specified abnormal findings of blood chemistry: Secondary | ICD-10-CM | POA: Diagnosis not present

## 2018-12-01 DIAGNOSIS — Z992 Dependence on renal dialysis: Secondary | ICD-10-CM

## 2018-12-01 DIAGNOSIS — G43719 Chronic migraine without aura, intractable, without status migrainosus: Secondary | ICD-10-CM | POA: Diagnosis not present

## 2018-12-01 DIAGNOSIS — G40909 Epilepsy, unspecified, not intractable, without status epilepticus: Secondary | ICD-10-CM

## 2018-12-01 DIAGNOSIS — F1123 Opioid dependence with withdrawal: Secondary | ICD-10-CM

## 2018-12-01 DIAGNOSIS — I959 Hypotension, unspecified: Secondary | ICD-10-CM | POA: Diagnosis not present

## 2018-12-01 LAB — CBC WITH DIFFERENTIAL/PLATELET
Abs Immature Granulocytes: 0.01 10*3/uL (ref 0.00–0.07)
BASOS ABS: 0 10*3/uL (ref 0.0–0.1)
Basophils Relative: 1 %
EOS ABS: 0.2 10*3/uL (ref 0.0–0.5)
Eosinophils Relative: 3 %
HEMATOCRIT: 33.6 % — AB (ref 36.0–46.0)
Hemoglobin: 10.3 g/dL — ABNORMAL LOW (ref 12.0–15.0)
Immature Granulocytes: 0 %
Lymphocytes Relative: 30 %
Lymphs Abs: 1.9 10*3/uL (ref 0.7–4.0)
MCH: 30.2 pg (ref 26.0–34.0)
MCHC: 30.7 g/dL (ref 30.0–36.0)
MCV: 98.5 fL (ref 80.0–100.0)
Monocytes Absolute: 0.7 10*3/uL (ref 0.1–1.0)
Monocytes Relative: 11 %
NEUTROS PCT: 55 %
Neutro Abs: 3.6 10*3/uL (ref 1.7–7.7)
Platelets: 252 10*3/uL (ref 150–400)
RBC: 3.41 MIL/uL — ABNORMAL LOW (ref 3.87–5.11)
RDW: 13.7 % (ref 11.5–15.5)
WBC: 6.5 10*3/uL (ref 4.0–10.5)
nRBC: 0 % (ref 0.0–0.2)

## 2018-12-01 LAB — POCT CBC
Granulocyte percent: 64.9 %G (ref 37–80)
HEMATOCRIT: 31.4 % (ref 29–41)
Hemoglobin: 10.3 g/dL — AB (ref 11–14.6)
Lymph, poc: 1.8 (ref 0.6–3.4)
MCH, POC: 30.1 pg (ref 27–31.2)
MCHC: 33 g/dL (ref 31.8–35.4)
MCV: 91.2 fL (ref 76–111)
MID (cbc): 0.5 (ref 0–0.9)
MPV: 7.5 fL (ref 0–99.8)
POC Granulocyte: 4.3 (ref 2–6.9)
POC LYMPH PERCENT: 27.7 %L (ref 10–50)
POC MID %: 7.4 %M (ref 0–12)
Platelet Count, POC: 307 10*3/uL (ref 142–424)
RBC: 3.44 M/uL — AB (ref 4.04–5.48)
RDW, POC: 14.7 %
WBC: 63.6 10*3/uL — AB (ref 4.6–10.2)

## 2018-12-01 LAB — POCT GLYCOSYLATED HEMOGLOBIN (HGB A1C): Hemoglobin A1C: 8.6 % — AB (ref 4.0–5.6)

## 2018-12-01 LAB — URINALYSIS, ROUTINE W REFLEX MICROSCOPIC
BILIRUBIN URINE: NEGATIVE
Bacteria, UA: NONE SEEN
Glucose, UA: NEGATIVE mg/dL
Hgb urine dipstick: NEGATIVE
Ketones, ur: NEGATIVE mg/dL
Nitrite: NEGATIVE
Protein, ur: NEGATIVE mg/dL
SPECIFIC GRAVITY, URINE: 1.025 (ref 1.005–1.030)
WBC, UA: >50 WBC/hpf — ABNORMAL HIGH (ref 0–5)
pH: 5 (ref 5.0–8.0)

## 2018-12-01 LAB — COMPREHENSIVE METABOLIC PANEL
ALT: 30 U/L (ref 0–44)
AST: 28 U/L (ref 15–41)
Albumin: 4 g/dL (ref 3.5–5.0)
Alkaline Phosphatase: 69 U/L (ref 38–126)
Anion gap: 8 (ref 5–15)
BILIRUBIN TOTAL: 0.4 mg/dL (ref 0.3–1.2)
BUN: 25 mg/dL — ABNORMAL HIGH (ref 8–23)
CO2: 24 mmol/L (ref 22–32)
Calcium: 10.6 mg/dL — ABNORMAL HIGH (ref 8.9–10.3)
Chloride: 104 mmol/L (ref 98–111)
Creatinine, Ser: 1.59 mg/dL — ABNORMAL HIGH (ref 0.44–1.00)
GFR calc Af Amer: 38 mL/min — ABNORMAL LOW (ref >60)
GFR, EST NON AFRICAN AMERICAN: 33 mL/min — AB (ref >60)
Glucose, Bld: 168 mg/dL — ABNORMAL HIGH (ref 70–99)
Potassium: 3.9 mmol/L (ref 3.5–5.1)
Sodium: 136 mmol/L (ref 135–145)
TOTAL PROTEIN: 7.1 g/dL (ref 6.5–8.1)

## 2018-12-01 LAB — POCT URINALYSIS DIP (MANUAL ENTRY)
Blood, UA: NEGATIVE
Glucose, UA: NEGATIVE mg/dL
Nitrite, UA: NEGATIVE
Spec Grav, UA: 1.025 (ref 1.010–1.025)
Urobilinogen, UA: 0.2 E.U./dL
pH, UA: 5 (ref 5.0–8.0)

## 2018-12-01 LAB — LACTIC ACID, PLASMA: Lactic Acid, Venous: 1.1 mmol/L (ref 0.5–1.9)

## 2018-12-01 MED ORDER — SODIUM CHLORIDE 0.9 % IV SOLN
INTRAVENOUS | Status: DC
  Administered 2018-12-01: 19:00:00 via INTRAVENOUS

## 2018-12-01 NOTE — Progress Notes (Signed)
Established Patient Office Visit  Subjective:  Patient ID: Natalie [name], female    DOB: [birth year]  Age: 68 y.o. MRN: [id number]  CC:  Chief Complaint  Patient presents with  . Chronic Conditions    pt need to follow-up and Est. Care with new PCP   . Medication Refills    HPI Natalie [name] presents for   Chronic opiate use Opiate withdrawal She would rate her pain between a 7/10 Patient reports that she is out of stadol and has not been on stadol for a week  She states that she has been on stadol for at least 10 years She states that she has terrible headaches and nausea but she has a history of migraines and that is why she has been getting stadol She has been seen at [facility] for migraines She states that she has tried meds for headaches and only the stadol helps.   Hypertension  She is on olmesartan, amlodipine She is currently having low bp but does not feel like her heart is racing or feel dizzy No chest pains No numbness or tingling in her fingers  BP Readings from Last 3 Encounters:  12/01/18 (!) 80/62  08/07/18 (!) 150/80  05/05/18 (!) 151/85   Seizure disorder She has a history of seizure disorder on phenobarbital She states that she has not felt like she will have a seizure She has gone a week without stadol but denies seizure activity She states that she had a previous episode of low bp in the past that was like this and her bp went back to normal on its own. Her seizure triggers are lack of sleep and strobe lights  Insomnia She also takes [place] for insomnia but has been out of [place] medication with her last script running out on 11/04/2018. She has a few that she has been stretching  Uncontrolled Diabetes She is taking glipizide 68m BID,  She was started on insulin as of 08/07/2018 She takes 5 units of NPH bid before  Meals She denies hypoglycemia  Lab Results  Component Value Date   HGBA1C 8.6 (A) 12/01/2018     Past Medical History:    Diagnosis Date  . Arthritis   . Atrophy of left kidney   . Cervicalgia   . Chronic kidney disease, stage 3 ([place]    acute aki 03-14-2018 in setting pyelonehritis-- resolved  . Chronic migraine   . Chronic pain syndrome   . History of kidney stones   . History of pyelonephritis    hx recurrent  . History of recurrent UTIs   . History of sepsis    07/ 2017 secondary to pyelonephritis  . Hypertension   . Hyperthyroidism   . Iron deficiency anemia   . Left ureteral stone   . Mixed hyperlipidemia   . Nephrolithiasis    right side nonobstructive per CT 03-14-2018  . Right shoulder pain    post sx 02-18-2018  . Seizure disorder ([place] followed [place] pcp   per pt dx age 68-  no seizure since 1980's , controlled w/ phenobarbitol  . Type 2 diabetes mellitus ([place]    followed by pcp  . Wears glasses     Past Surgical History:  Procedure Laterality Date  . CESAREAN SECTION  1974  . CHOLECYSTECTOMY OPEN  2002  . CYSTO/  LEFT RETROGRADE PYELOGRAM  06-11-2006   [facility] . CYSTOSCOPY WITH RETROGRADE PYELOGRAM, URETEROSCOPY AND STENT PLACEMENT Left 04/10/2018   Procedure: CYSTOSCOPY  WITH RETROGRADE PYELOGRAM, URETEROSCOPY AND STENT PLACEMENT;  Surgeon: Franchot Gallo, MD;  Location: [facility];  Service: Urology;  Laterality: Left;  . IR URETERAL STENT PLACEMENT EXISTING ACCESS RIGHT  08-21-2007    dr Karsten Ro  [facility]  . LEFT RETROGRADE PYELOGRAM/ LEFT URETERAL STENT PLACEMENT  02-25-2001   dr Jeffie Pollock [facility]  . NEPHROLITHOTOMY Right 05/14/2016   Procedure: NEPHROLITHOTOMY PERCUTANEOUS;  Surgeon: Franchot Gallo, MD;  Location: [facility];  Service: Urology;  Laterality: Right;  . SHOULDER ARTHROSCOPY Bilateral left 11-13-2000;  right 02-18-2018    Family History  Problem Relation Age of Onset  . Heart disease Father   . Diabetes Father     Social History   Socioeconomic History  . Marital status: Married    Spouse name: Not on file  . Number of children: Not on  file  . Years of education: Not on file  . Highest education level: Not on file  Occupational History  . Not on file  Social Needs  . Financial resource strain: Not on file  . Food insecurity:    Worry: Not on file    Inability: Not on file  . Transportation needs:    Medical: Not on file    Non-medical: Not on file  Tobacco Use  . Smoking status: Never Smoker  . Smokeless tobacco: Never Used  Substance and Sexual Activity  . Alcohol use: No    Alcohol/week: 0.0 standard drinks  . Drug use: No  . Sexual activity: Not on file  Lifestyle  . Physical activity:    Days per week: Not on file    Minutes per session: Not on file  . Stress: Not on file  Relationships  . Social connections:    Talks on phone: Not on file    Gets together: Not on file    Attends religious service: Not on file    Active member of club or organization: Not on file    Attends meetings of clubs or organizations: Not on file    Relationship status: Not on file  . Intimate partner violence:    Fear of current or ex partner: Not on file    Emotionally abused: Not on file    Physically abused: Not on file    Forced sexual activity: Not on file  Other Topics Concern  . Not on file  Social History Narrative  . Not on file    Outpatient Medications Prior to Visit  Medication Sig Dispense Refill  . amLODipine (NORVASC) 5 MG tablet Take 1 tablet (5 mg total) by mouth daily. (Patient taking differently: Take 5 mg by mouth daily with supper. ) 90 tablet 3  . atorvastatin (LIPITOR) 40 MG tablet Take 1 tablet (40 mg total) by mouth daily at 6 PM. 90 tablet 1  . butorphanol (STADOL) 10 MG/ML nasal spray PLACE 1 SPRAY INTO THE NOSE EVERY 4 HOURS AS NEEDED FOR MIGRAINE. 2.5 mL 5  . estradiol (ESTRACE) 0.1 MG/GM vaginal cream Use 1 fingertip amount to urethra, clitoris, and vaginal introitus every night x 2 weeks, then decreased to 2-3x weekly 42.5 g 5  . glipiZIDE (GLUCOTROL) 10 MG tablet TAKE 2 TABLETS (20 MG  TOTAL) BY MOUTH 2 (TWO) TIMES DAILY BEFORE A MEAL. 360 tablet 0  . HUMULIN N 100 UNIT/ML injection INJECT 5 UNITS TOTAL INTO THE SKIN 2 (TWO) TIMES DAILY BEFORE A MEAL. 10 mL 0  . insulin NPH Human (HUMULIN N,NOVOLIN N) 100 UNIT/ML injection Inject 0.05 mLs (  5 Units total) into the skin 2 (two) times daily before a meal. 10 mL 0  . insulin NPH Human (HUMULIN N,NOVOLIN N) 100 UNIT/ML injection Inject 0.05 mLs (5 Units total) into the skin 2 (two) times daily before a meal. 10 mL 0  . Insulin Syringe-Needle U-100 (INSULIN SYRINGE 1CC/31GX5/16") 31G X 5/16" 1 ML MISC 1 Units by Does not apply route as directed. Dx: E11.29 200 each 3  . levothyroxine (SYNTHROID, LEVOTHROID) 75 MCG tablet Take 1 tablet (75 mcg total) by mouth daily. 90 tablet 1  . olmesartan (BENICAR) 40 MG tablet Take 1 tablet (40 mg total) by mouth daily. 90 tablet 0  . PHENobarbital (LUMINAL) 97.2 MG tablet TAKE 2 TABLETS BY MOUTH ONCE DAILY ON MON,WED,AND FRI AND 1 TABLET DAILY ON OTHER DAYS 135 tablet 1  . tiZANidine (ZANAFLEX) 4 MG tablet Take 1 tablet (4 mg total) by mouth every 4 (four) hours as needed for muscle spasms. TAKE 1 TABLET BY MOUTH EVERY 6 HOURS AS NEEDED FOR MUSCLE SPASMS. AND TAKE EVERY NIGHT BEFORE BED 180 tablet 1  . Vaginal Lubricant (REPLENS) GEL Place 1 application vaginally 3 (three) times a week. 35 g 11  . vitamin B-12 1000 MCG tablet Take 1 tablet (1,000 mcg total) by mouth daily. 30 tablet 0  . zolpidem (AMBIEN) 10 MG tablet take 1 tablet by mouth at bedtime for sleep if needed. 90 tablet 1   No facility-administered medications prior to visit.     Allergies  Allergen Reactions  . Compazine Other (See Comments)    Bad headache, vomiting   . Cymbalta [Duloxetine Hcl] Other (See Comments)    "Kept me awake"  . Methadone Hives  . Escitalopram Anxiety  . Macrobid [Nitrofurantoin Monohyd Macro] Rash    ROS Review of Systems Review of Systems  Constitutional: Negative for activity change, appetite  change, chills and fever.  HENT: Negative for congestion, nosebleeds, trouble swallowing and voice change.   Respiratory: Negative for cough, shortness of breath and wheezing.   Gastrointestinal: Negative for diarrhea, nausea and vomiting.  Genitourinary: Negative for difficulty urinating, dysuria, flank pain and hematuria.  Musculoskeletal: Negative for back pain, joint swelling and neck pain.  Neurological: Negative for dizziness, speech difficulty, light-headedness and numbness. + HEADACHE See HPI. All other review of systems negative.     Objective:    Physical Exam  BP (!) 80/62   Pulse (!) 55   Temp 98 F (36.7 C) (Oral)   Resp 16   Ht '4\' 8"'  (1.422 m)   Wt 161 lb (73 kg)   SpO2 96%   BMI 36.10 kg/m  Wt Readings from Last 3 Encounters:  12/01/18 161 lb (73 kg)  08/07/18 150 lb (68 kg)  05/05/18 150 lb 6.4 oz (68.2 kg)   Physical Exam  Constitutional: Oriented to person, place, and time. Appears well-developed and well-nourished.  HENT:  Head: Normocephalic and atraumatic.  Eyes: Conjunctivae and EOM are normal.  Cardiovascular: Normal rate, regular rhythm, normal heart sounds and intact distal pulses.  No murmur heard. Pulmonary/Chest: Effort normal and breath sounds normal. No stridor. No respiratory distress. Has no wheezes.  Neurological: Is alert and oriented to person, place, and time.  Skin: Skin is warm. Capillary refill takes less than 2 seconds.  Psychiatric: Has a normal mood and affect. Behavior is normal. Judgment and thought content normal.    EKG: sinus bradycardia  There are no preventive care reminders to display for this patient.  There are  no preventive care reminders to display for this patient.  Lab Results  Component Value Date   TSH 1.258 03/13/2018   Lab Results  Component Value Date   WBC 7.7 10/08/2018   HGB 11.0 (L) 10/08/2018   HCT 33.6 (L) 10/08/2018   MCV 93 10/08/2018   PLT 278 10/08/2018   Lab Results  Component Value  Date   NA 140 10/08/2018   K 5.2 10/08/2018   CO2 21 10/08/2018   GLUCOSE 168 (H) 10/08/2018   BUN 42 (H) 10/08/2018   CREATININE 2.19 (H) 10/08/2018   BILITOT <0.2 08/07/2018   ALKPHOS 93 08/07/2018   AST 36 08/07/2018   ALT 51 (H) 08/07/2018   PROT 7.3 08/07/2018   ALBUMIN 4.6 08/07/2018   CALCIUM 9.7 10/08/2018   ANIONGAP 9 03/15/2018   Lab Results  Component Value Date   CHOL 159 10/08/2018   Lab Results  Component Value Date   HDL 75 10/08/2018   Lab Results  Component Value Date   LDLCALC 65 10/08/2018   Lab Results  Component Value Date   TRIG 94 10/08/2018   Lab Results  Component Value Date   CHOLHDL 2.1 10/08/2018   Lab Results  Component Value Date   HGBA1C 8.6 (A) 12/01/2018      Assessment & Plan:   Problem List Items Addressed This Visit      Cardiovascular and Mediastinum   Migraine - Primary   Relevant Orders   Ambulatory referral to Neurology   Hypotension   Relevant Orders   EKG 12-Lead   POCT urinalysis dipstick   POCT CBC   Orthostatic vital signs   Phenobarbital level   CMP14+EGFR     Nervous and Auditory   Seizure disorder ([place])   Relevant Orders   Phenobarbital level   CMP14+EGFR   Ambulatory referral to Neurology    Other Visit Diagnoses    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, with long-term current use of insulin (HCC)       Relevant Orders   POCT glycosylated hemoglobin (Hb A1C) (Completed)   Opiate withdrawal ([place])       Relevant Orders   Ambulatory referral to Pain Clinic   Chronic prescription opiate use       Relevant Orders   Ambulatory referral to Pain Clinic      Medical decision making  Given patient's age and co-morbidities I would not refill stadol at this time She also has a concerning hypotension  The concern is that this patient has a history of seizure disorder and now is hypotensive and bradycardic She declines ER follow up and given  that she is otherwise asymptomatic advised her to follow up at the ER if her bp remains persistently low  This provider explained that her a1c improved and she should continue her insulin and glipizide She should eat regular meals and plan to follow up  Opiate use was discouraged given her history of seizures and kidney disease She was advised to see Neurology for migraine and Seizure do and to see Pain [facility] for her pain Withdrawal signs are minimal and patient declined taking anyone else's opiates Discussed that she should monitor for withdrawal of opiates Also advised her that detoxing would be helpful because she could start on new medication that are less potent  Given her usually high bp  And her currently low bp checked for signs of infection  Wbc 63!! Also bradycardia Concern for SIRS  Advised to go to the ER for further evaluation     No orders of the defined types were placed in this encounter.   Follow-up: No follow-ups on file.    Forrest Moron, MD

## 2018-12-01 NOTE — Discharge Instructions (Addendum)
Monitor for any urinary symptoms, urinalysis did show the possibility of a urinary tract infection.  We sent off a urine culture today.  The elevated white blood cell count that your doctor noted at the office was normal in our laboratory test today.

## 2018-12-01 NOTE — Patient Instructions (Addendum)
Please check your blood pressure tonight and tomorrow  If the blood pressure is still low please go to [place]  BP 80/60   If you have lab work done today you will be contacted with your lab results within the next 2 weeks.  If you have not heard from [place] then please contact us. The fastest way to get your results is to register for My Chart.   IF you received an x-ray today, you will receive an invoice from [facility]. Please contact [facility] at [phone] with questions or concerns regarding your invoice.   IF you received labwork today, you will receive an invoice from [place]. Please contact [facility] at [phone] with questions or concerns regarding your invoice.   Our billing staff will not be able to assist you with questions regarding bills from these companies.  You will be contacted with the lab results as soon as they are available. The fastest way to get your results is to activate your My Chart account. Instructions are located on the last page of this paperwork. If you have not heard from [place] regarding the results in 2 weeks, please contact this office.      Opioid Withdrawal Opioids are powerful substances that relieve pain. Opioids include illegal drugs, such as heroin, as well as prescription pain medicines, such as codeine, morphine, hydrocodone, oxycodone, and fentanyl. Opioid withdrawal is a group of symptoms that can happen if you have been taking opioids for a long time and suddenly stop. What are the causes? This condition is caused by taking opioids for weeks and then doing any of the following:  Stopping use.  Rapidly reducing use.  Taking a medicine to block their effect. What increases the risk? This condition is more likely to develop in:  People who take opioids incorrectly.  People who take opioids for a long period of time. What are the signs or symptoms? Symptoms of this condition can be physical or mental. Physical  symptoms include:  Nausea and vomiting.  Muscle aches or spasms.  Watery eyes and runny nose.  Widening of the dark centers of the eyes (dilated pupils).  Hair standing on end.  Fever and sweating.  Intestinal cramping and diarrhea.  Increased blood pressure and fast pulse. Mental symptoms include:  Depression.  Anxiety.  Restlessness and irritability.  Trouble sleeping. When symptoms start and how long they last depends on if you have been taking an opioid that works fast and then loses its effect quickly (short acting-opioid), an opioid that works for a longer period of time (long-acting opioid), or a drug that blocks the effects of opioids.  If you have been taking a short-acting opioid, such as heroin and oxycodone, symptoms occur within hours of stopping or reducing the amount you take. The worst symptoms (peak withdrawal) occur in 24-48 hours. Symptoms should subside in 3-5 days.  If you have been taking a long-acting opioid, such as methadone, symptoms can occur within 30 hours of stopping or reducing the amount you take and can continue for up to 10 days.  If you are taking a drug that blocks the effects of opioids, such as naltrexone or naloxone, symptoms begin within minutes. How is this diagnosed? This condition is diagnosed based on:  Your symptoms.  Your medical history.  Your history of drug and alcohol use.  Which medicines you have been taking. Your health care provider may:  Perform a physical exam.  Order tests.  Ask that you see a  mental health professional. How is this treated? Treatment for this condition is usually provided by mental health professionals with training in substance use disorders (addiction specialists). Treatment may involve:  Counseling. This treatment is also called talk therapy. It is provided by substance use treatment counselors.  Support groups. Support groups are run by people who have quit using opioids. They provide  emotional support, advice, and guidance.  Medicine. Some medicines can help to lessen certain withdrawal symptoms. Sometimes an opioid is prescribed to replace the opioid that you have been taking. You may be asked to take less and less of this opioid over time to lessen or prevent withdrawal symptoms. Follow these instructions at home:  Take over-the-counter and prescription medicines only as told by your health care provider.  Check with your health care provider before starting any new medicines.  Keep all follow-up visits as told by your health care provider. This is important. Contact a health care provider if:  You are not able to take your medicines as told.  Your symptoms get worse.  You take an opioid after stopping use, or you take more of an opioid than you have been. Get help right away if:  You have a seizure.  You lose consciousness.  You have serious thoughts about hurting yourself or others. If you ever feel like you may hurt yourself or others, or have thoughts about taking your own life, get help right away. You can go to your nearest emergency department or call:  Your local emergency services (911 in the [place]  A suicide crisis helpline, such as the [facility] at 878-117-0887. This is open 24 hours a day. This information is not intended to replace advice given to you by your health care provider. Make sure you discuss any questions you have with your health care provider. Document Released: 09/20/2003 Document Revised: 06/29/2016 Document Reviewed: 06/29/2016 Elsevier Interactive Patient Education  2019 [employer].

## 2018-12-01 NOTE — ED Triage Notes (Signed)
Patient states that she was called by her PCp and was told her WBC was 63.3 Patient is hypotensive at the PCP office. BP in triage 123/62 and HR -44.

## 2018-12-01 NOTE — ED Provider Notes (Signed)
[facility] DEPT Provider Note   CSN: [id number] Arrival date & time: 12/01/18  1757   History   Chief Complaint Chief Complaint  Patient presents with  . Abnormal Lab    HPI Natalie [name] is a 68 y.o. female.  HPI Pt presents for evaluation of elevated WBC.   Pt went to her doctors office today for a routine visit.  Patient denies any specific complaints.  She states he has been in her usual state of health.  She denies having any trouble with any fevers.  She has not had any difficulties with chest pain or shortness of breath.  She denies a sore throat.  She denies vomiting or diarrhea.  No abdominal pain.  No dysuria.  She has not noticed any rashes or sores.  At the doctor's office they did note that she was bradycardic.  Reportedly her blood pressure was also low.  She was called from the office today when she was home and told to go to the emergency room because her white blood cell count was very elevated. Past Medical History:  Diagnosis Date  . Arthritis   . Atrophy of left kidney   . Cervicalgia   . Chronic kidney disease, stage 3 ([place])    acute aki 03-14-2018 in setting pyelonehritis-- resolved  . Chronic migraine   . Chronic pain syndrome   . History of kidney stones   . History of pyelonephritis    hx recurrent  . History of recurrent UTIs   . History of sepsis    07/ 2017 secondary to pyelonephritis  . Hypertension   . Hyperthyroidism   . Iron deficiency anemia   . Left ureteral stone   . Mixed hyperlipidemia   . Nephrolithiasis    right side nonobstructive per CT 03-14-2018  . Right shoulder pain    post sx 02-18-2018  . Seizure disorder ([place]) followed by pcp   per pt dx age 59--  no seizure since 1980's , controlled w/ phenobarbitol  . Type 2 diabetes mellitus ([place])    followed by pcp  . Wears glasses     Patient Active Problem List   Diagnosis Date Noted  . Chronic pain syndrome 03/16/2018  . Encounter for long-term  (current) use of high-risk medication 03/16/2018  . Complicated UTI (urinary tract infection) 03/14/2018  . Acute renal failure superimposed on stage 3 chronic kidney disease ([place]) 03/14/2018  . Hypotension 03/14/2018  . Generalized weakness   . Chronic kidney disease 09/06/2016  . Anemia, iron deficiency 06/23/2016  . Vitamin B12 deficiency (dietary) anemia 06/23/2016  . Migraine, chronic, without aura, intractable, with status migrainosus 01/04/2016  . Abdominal pain   . Pyelonephritis 08/31/2014  . 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 Renal stone 05/14/2013  . BMI 33.0-33.9,adult 03/30/2013  . Recurrent UTI 09/17/2012  . Insulin dependent diabetes with renal manifestation ([place]) 03/19/2012  . HTN (hypertension) 03/19/2012  . Hyperlipemia 03/19/2012  . Hypothyroidism 03/19/2012  . Seizure disorder ([place]) 03/19/2012  . Insomnia 03/19/2012  . Migraine 03/19/2012    Past Surgical History:  Procedure Laterality Date  . CESAREAN SECTION  1974  . CHOLECYSTECTOMY OPEN  2002  . CYSTO/  LEFT RETROGRADE PYELOGRAM  06-11-2006   dr Jeffie Pollock  [facility]  . CYSTOSCOPY WITH RETROGRADE PYELOGRAM, URETEROSCOPY AND STENT PLACEMENT Left 04/10/2018   Procedure: CYSTOSCOPY WITH RETROGRADE PYELOGRAM, URETEROSCOPY AND STENT PLACEMENT;  Surgeon: Franchot Gallo, MD;  Location: [facility];  Service: Urology;  Laterality: Left;  . IR URETERAL STENT PLACEMENT EXISTING ACCESS RIGHT  08-21-2007    dr Karsten Ro  [facility]  . LEFT RETROGRADE PYELOGRAM/ LEFT URETERAL STENT PLACEMENT  02-25-2001   dr Jeffie Pollock [facility]  . NEPHROLITHOTOMY Right 05/14/2016   Procedure: NEPHROLITHOTOMY PERCUTANEOUS;  Surgeon: Franchot Gallo, MD;  Location: [facility];  Service: Urology;  Laterality: Right;  . SHOULDER ARTHROSCOPY Bilateral left 11-13-2000;  right 02-18-2018     OB History   No obstetric history on file.      Home Medications    Prior to Admission medications     Medication Sig Start Date End Date Taking? Authorizing Provider  amLODipine (NORVASC) 5 MG tablet Take 1 tablet (5 mg total) by mouth daily. Patient taking differently: Take 5 mg by mouth daily with supper.  02/08/18  Yes Shawnee Joel Mericle, MD  atorvastatin (LIPITOR) 40 MG tablet Take 1 tablet (40 mg total) by mouth daily at 6 PM. 08/07/18  Yes Shawnee Milayna Rotenberg, MD  butorphanol (STADOL) 10 MG/ML nasal spray PLACE 1 SPRAY INTO THE NOSE EVERY 4 HOURS AS NEEDED FOR MIGRAINE. 10/08/18  Yes Shawnee Miasia Crabtree, MD  estradiol (ESTRACE) 0.1 MG/GM vaginal cream Use 1 fingertip amount to urethra, clitoris, and vaginal introitus every night x 2 weeks, then decreased to 2-3x weekly 02/08/18  Yes Shawnee Jessup Ogas, MD  glipiZIDE (GLUCOTROL) 10 MG tablet TAKE 2 TABLETS (20 MG TOTAL) BY MOUTH 2 (TWO) TIMES DAILY BEFORE A MEAL. 11/17/18  Yes Shawnee Anajah Sterbenz, MD  insulin NPH Human (HUMULIN N,NOVOLIN N) 100 UNIT/ML injection Inject 0.05 mLs (5 Units total) into the skin 2 (two) times daily before a meal. 10/17/18  Yes Rutherford Guys, MD  levothyroxine (SYNTHROID, LEVOTHROID) 75 MCG tablet Take 1 tablet (75 mcg total) by mouth daily. 08/07/18  Yes Shawnee Floride Hutmacher, MD  olmesartan (BENICAR) 40 MG tablet Take 1 tablet (40 mg total) by mouth daily. 11/03/18  Yes Shawnee Tarrin Menn, MD  PHENobarbital (LUMINAL) 97.2 MG tablet TAKE 2 TABLETS BY MOUTH ONCE DAILY ON MON,WED,AND FRI AND 1 TABLET DAILY ON OTHER DAYS 08/07/18  Yes Shawnee Turki Tapanes, MD  tiZANidine (ZANAFLEX) 4 MG tablet Take 1 tablet (4 mg total) by mouth every 4 (four) hours as needed for muscle spasms. TAKE 1 TABLET BY MOUTH EVERY 6 HOURS AS NEEDED FOR MUSCLE SPASMS. AND TAKE EVERY NIGHT BEFORE BED 11/10/18  Yes Shawnee Amabel Stmarie, MD  Vaginal Lubricant (REPLENS) GEL Place 1 application vaginally 3 (three) times a week. 02/10/18  Yes Shawnee Aman Batley, MD  zolpidem (AMBIEN) 10 MG tablet take 1 tablet by mouth at bedtime for sleep if needed. 05/08/18  Yes Shawnee Kavon Valenza, MD  HUMULIN N 100 UNIT/ML injection INJECT 5 UNITS TOTAL INTO THE  SKIN 2 (TWO) TIMES DAILY BEFORE A MEAL. Patient not taking: Reported on 12/01/2018 11/17/18   Rutherford Guys, MD  insulin NPH Human (HUMULIN N,NOVOLIN N) 100 UNIT/ML injection Inject 0.05 mLs (5 Units total) into the skin 2 (two) times daily before a meal. Patient not taking: Reported on 12/01/2018 10/20/18   Rutherford Guys, MD  Insulin Syringe-Needle U-100 (INSULIN SYRINGE 1CC/31GX5/16") 31G X 5/16" 1 ML MISC 1 Units by Does not apply route as directed. Dx: E11.29 11/10/18   Shawnee Rodriquez Thorner, MD  vitamin B-12 1000 MCG tablet Take 1 tablet (1,000 mcg total) by mouth daily. Patient not taking: Reported on 12/01/2018 06/26/16   Janece Canterbury, MD    Family History  Family History  Problem Relation Age of Onset  . Heart disease Father   . Diabetes Father     Social History Social History   Tobacco Use  . Smoking status: Never Smoker  . Smokeless tobacco: Never Used  Substance Use Topics  . Alcohol use: No    Alcohol/week: 0.0 standard drinks  . Drug use: No     Allergies   Compazine; Cymbalta [duloxetine hcl]; Methadone; Escitalopram; and Macrobid [nitrofurantoin monohyd macro]   Review of Systems Review of Systems  All other systems reviewed and are negative.    Physical Exam Updated Vital Signs BP 138/72   Pulse (!) 46   Temp 97.9 F (36.6 C) (Oral)   Resp 15   Ht 1.422 m (4\' 8" )   Wt 73 kg   SpO2 100%   BMI 36.08 kg/m   Physical Exam Vitals signs and nursing note reviewed.  Constitutional:      General: She is not in acute distress.    Appearance: She is well-developed.  HENT:     Head: Normocephalic and atraumatic.     Right Ear: External ear normal.     Left Ear: External ear normal.  Eyes:     General: No scleral icterus.       Right eye: No discharge.        Left eye: No discharge.     Conjunctiva/sclera: Conjunctivae normal.  Neck:     Musculoskeletal: Neck supple.     Trachea: No tracheal deviation.  Cardiovascular:     Rate and Rhythm: Regular  rhythm. Bradycardia present.  Pulmonary:     Effort: Pulmonary effort is normal. No respiratory distress.     Breath sounds: Normal breath sounds. No stridor. No wheezing or rales.  Abdominal:     General: Bowel sounds are normal. There is no distension.     Palpations: Abdomen is soft.     Tenderness: There is no abdominal tenderness. There is no guarding or rebound.  Musculoskeletal:        General: No tenderness.  Skin:    General: Skin is warm and dry.     Findings: No rash.  Neurological:     Mental Status: She is alert.     Cranial Nerves: No cranial nerve deficit (no facial droop, extraocular movements intact, no slurred speech).     Sensory: No sensory deficit.     Motor: No abnormal muscle tone or seizure activity.     Coordination: Coordination normal.      ED Treatments / Results  Labs (all labs ordered are listed, but only abnormal results are displayed) Labs Reviewed  CBC WITH DIFFERENTIAL/PLATELET - Abnormal; Notable for the following components:      Result Value   RBC 3.41 (*)    Hemoglobin 10.3 (*)    HCT 33.6 (*)    All other components within normal limits  COMPREHENSIVE METABOLIC PANEL - Abnormal; Notable for the following components:   Glucose, Bld 168 (*)    BUN 25 (*)    Creatinine, Ser 1.59 (*)    Calcium 10.6 (*)    GFR calc non Af Amer 33 (*)    GFR calc Af Amer 38 (*)    All other components within normal limits  URINALYSIS, ROUTINE W REFLEX MICROSCOPIC - Abnormal; Notable for the following components:   APPearance HAZY (*)    Leukocytes,Ua MODERATE (*)    WBC, UA >50 (*)    All other components within normal limits  CULTURE, BLOOD (ROUTINE X 2)  CULTURE, BLOOD (ROUTINE X 2)  URINE CULTURE  LACTIC ACID, PLASMA    EKG EKG Interpretation  Date/Time:  Monday December 01 2018 18:12:37 EST Ventricular Rate:  44 PR Interval:    QRS Duration: 93 QT Interval:  477 QTC Calculation: 408 R Axis:   49 Text Interpretation:  Sinus bradycardia  Low voltage, extremity leads Abnormal R-wave progression, early transition Minimal ST elevation, inferior leads Baseline wander in lead(s) V3 Since last tracing rate slower Confirmed by Dorie Rank [phone]) on 12/01/2018 8:40:44 PM   Radiology Dg Chest Portable 1 View  Result Date: 12/01/2018 CLINICAL DATA:  Leukocytosis and hypotension. EXAM: PORTABLE CHEST 1 VIEW COMPARISON:  Chest radiograph March 13, 2018 FINDINGS: Cardiomediastinal silhouette is normal. No pleural effusions or focal consolidations. Trachea projects midline and there is no pneumothorax. Soft tissue planes and included osseous structures are non-suspicious. Similar widened acromioclavicular joint spaces. IMPRESSION: No acute cardiopulmonary process. Electronically Signed   By: Elon Alas M.D.   On: 12/01/2018 18:50    Procedures Procedures (including critical care time)  Medications Ordered in ED Medications  0.9 %  sodium chloride infusion ( Intravenous New Bag/Given 12/01/18 1857)     Initial Impression / Assessment and Plan / ED Course  I have reviewed the triage vital signs and the nursing notes.  Pertinent labs & imaging results that were available during my care of the patient were reviewed by me and considered in my medical decision making (see chart for details).  Clinical Course as of Dec 01 2038  Mon Dec 01, 2018  2015 Patient's white blood cell count is normal at 6.5.   [JK]  2015 Her hemoglobin is stable.  Electrolyte panel shows an elevation of her BUN and creatinine but this is chronic and unchanged.  Urinalysis does show white blood cells and moderate leukocyte Estrace but she denies any urinary symptoms.  I will send off a urine culture   [JK]    Clinical Course User Index [JK] Dorie Rank, MD    Patient was sent to the ED for evaluation of an elevated white blood cell count.  Patient's white count was 63.6 at her doctor's office.  Patient was asymptomatic.  Her blood cell count is completely normal  our test today.  I suspect a lab error on the machine at her doctor's office.  Patient's urinalysis does suggest the possibility urinary tract infection however she has no symptoms whatsoever.  I will send off a urine culture but hold off on empiric antibiotics right now.  Patient was also noted to have a sinus bradycardia but she is not have any lightheadedness or dizziness.  Patient is stable for outpatient follow-up.  Final Clinical Impressions(s) / ED Diagnoses   Final diagnoses:  Labor abnormality    ED Discharge Orders    None       Dorie Rank, MD 12/01/18 2040

## 2018-12-02 ENCOUNTER — Encounter: Payer: Self-pay | Admitting: Family Medicine

## 2018-12-02 LAB — CMP14+EGFR
ALT: 26 IU/L (ref 0–32)
AST: 24 IU/L (ref 0–40)
Albumin/Globulin Ratio: 2.1 (ref 1.2–2.2)
Albumin: 4.2 g/dL (ref 3.8–4.8)
Alkaline Phosphatase: 71 IU/L (ref 39–117)
BUN/Creatinine Ratio: 13 (ref 12–28)
BUN: 21 mg/dL (ref 8–27)
Bilirubin Total: <0.2 mg/dL (ref 0.0–1.2)
CO2: 20 mmol/L (ref 20–29)
Calcium: 10.5 mg/dL — ABNORMAL HIGH (ref 8.7–10.3)
Chloride: 105 mmol/L (ref 96–106)
Creatinine, Ser: 1.66 mg/dL — ABNORMAL HIGH (ref 0.57–1.00)
GFR calc Af Amer: 36 mL/min/{1.73_m2} — ABNORMAL LOW (ref >59)
GFR calc non Af Amer: 31 mL/min/{1.73_m2} — ABNORMAL LOW (ref >59)
GLUCOSE: 230 mg/dL — AB (ref 65–99)
Globulin, Total: 2 g/dL (ref 1.5–4.5)
Potassium: 5.2 mmol/L (ref 3.5–5.2)
Sodium: 141 mmol/L (ref 134–144)
Total Protein: 6.2 g/dL (ref 6.0–8.5)

## 2018-12-02 LAB — PHENOBARBITAL LEVEL: Phenobarbital, Serum: 18 ug/mL (ref 15–40)

## 2018-12-03 ENCOUNTER — Other Ambulatory Visit: Payer: Self-pay | Admitting: Family Medicine

## 2018-12-03 DIAGNOSIS — M25511 Pain in right shoulder: Secondary | ICD-10-CM

## 2018-12-03 DIAGNOSIS — G8918 Other acute postprocedural pain: Secondary | ICD-10-CM

## 2018-12-03 LAB — URINE CULTURE

## 2018-12-06 LAB — CULTURE, BLOOD (ROUTINE X 2)
Culture: NO GROWTH
Culture: NO GROWTH
Special Requests: ADEQUATE
Special Requests: ADEQUATE

## 2018-12-18 ENCOUNTER — Ambulatory Visit: Payer: Medicare Other | Admitting: Family Medicine

## 2019-01-01 ENCOUNTER — Other Ambulatory Visit: Payer: Self-pay | Admitting: Family Medicine

## 2019-01-01 DIAGNOSIS — M25511 Pain in right shoulder: Secondary | ICD-10-CM

## 2019-01-01 DIAGNOSIS — G8918 Other acute postprocedural pain: Secondary | ICD-10-CM

## 2019-01-31 ENCOUNTER — Other Ambulatory Visit: Payer: Self-pay | Admitting: Family Medicine

## 2019-01-31 DIAGNOSIS — M25511 Pain in right shoulder: Secondary | ICD-10-CM

## 2019-01-31 DIAGNOSIS — G8918 Other acute postprocedural pain: Secondary | ICD-10-CM

## 2019-02-12 ENCOUNTER — Other Ambulatory Visit: Payer: Self-pay | Admitting: Family Medicine

## 2019-03-02 ENCOUNTER — Other Ambulatory Visit: Payer: Self-pay | Admitting: Family Medicine

## 2019-03-02 DIAGNOSIS — G8918 Other acute postprocedural pain: Secondary | ICD-10-CM

## 2019-03-02 DIAGNOSIS — M25511 Pain in right shoulder: Secondary | ICD-10-CM

## 2019-05-08 DIAGNOSIS — N183 Chronic kidney disease, stage 3 (moderate): Secondary | ICD-10-CM | POA: Diagnosis not present

## 2019-05-08 DIAGNOSIS — N200 Calculus of kidney: Secondary | ICD-10-CM | POA: Diagnosis not present

## 2019-05-08 DIAGNOSIS — D509 Iron deficiency anemia, unspecified: Secondary | ICD-10-CM | POA: Diagnosis not present

## 2019-05-08 DIAGNOSIS — D518 Other vitamin B12 deficiency anemias: Secondary | ICD-10-CM | POA: Diagnosis not present

## 2019-05-08 DIAGNOSIS — E118 Type 2 diabetes mellitus with unspecified complications: Secondary | ICD-10-CM | POA: Diagnosis not present

## 2019-05-08 DIAGNOSIS — I10 Essential (primary) hypertension: Secondary | ICD-10-CM | POA: Diagnosis not present

## 2019-05-08 DIAGNOSIS — E039 Hypothyroidism, unspecified: Secondary | ICD-10-CM | POA: Diagnosis not present

## 2019-05-26 DIAGNOSIS — Z23 Encounter for immunization: Secondary | ICD-10-CM | POA: Diagnosis not present

## 2019-08-09 ENCOUNTER — Emergency Department (HOSPITAL_COMMUNITY)
Admission: EM | Admit: 2019-08-09 | Discharge: 2019-08-09 | Disposition: A | Discharge status: Home / Self Care | Payer: Medicare Other | Attending: Emergency Medicine | Admitting: Emergency Medicine

## 2019-08-09 ENCOUNTER — Emergency Department (HOSPITAL_COMMUNITY): Payer: Medicare Other

## 2019-08-09 ENCOUNTER — Other Ambulatory Visit: Payer: Self-pay

## 2019-08-09 ENCOUNTER — Encounter (HOSPITAL_COMMUNITY): Payer: Self-pay | Admitting: Emergency Medicine

## 2019-08-09 DIAGNOSIS — Z794 Long term (current) use of insulin: Secondary | ICD-10-CM | POA: Diagnosis not present

## 2019-08-09 DIAGNOSIS — E860 Dehydration: Secondary | ICD-10-CM | POA: Diagnosis not present

## 2019-08-09 DIAGNOSIS — Y929 Unspecified place or not applicable: Secondary | ICD-10-CM | POA: Insufficient documentation

## 2019-08-09 DIAGNOSIS — Z79899 Other long term (current) drug therapy: Secondary | ICD-10-CM | POA: Insufficient documentation

## 2019-08-09 DIAGNOSIS — I129 Hypertensive chronic kidney disease with stage 1 through stage 4 chronic kidney disease, or unspecified chronic kidney disease: Secondary | ICD-10-CM | POA: Diagnosis not present

## 2019-08-09 DIAGNOSIS — S0990XA Unspecified injury of head, initial encounter: Secondary | ICD-10-CM | POA: Diagnosis not present

## 2019-08-09 DIAGNOSIS — Y999 Unspecified external cause status: Secondary | ICD-10-CM | POA: Diagnosis not present

## 2019-08-09 DIAGNOSIS — I10 Essential (primary) hypertension: Secondary | ICD-10-CM | POA: Diagnosis not present

## 2019-08-09 DIAGNOSIS — S92355A Nondisplaced fracture of fifth metatarsal bone, left foot, initial encounter for closed fracture: Secondary | ICD-10-CM | POA: Insufficient documentation

## 2019-08-09 DIAGNOSIS — R569 Unspecified convulsions: Secondary | ICD-10-CM | POA: Diagnosis not present

## 2019-08-09 DIAGNOSIS — S99921A Unspecified injury of right foot, initial encounter: Secondary | ICD-10-CM | POA: Diagnosis not present

## 2019-08-09 DIAGNOSIS — S92352A Displaced fracture of fifth metatarsal bone, left foot, initial encounter for closed fracture: Secondary | ICD-10-CM | POA: Diagnosis not present

## 2019-08-09 DIAGNOSIS — R531 Weakness: Secondary | ICD-10-CM | POA: Diagnosis present

## 2019-08-09 DIAGNOSIS — E1122 Type 2 diabetes mellitus with diabetic chronic kidney disease: Secondary | ICD-10-CM | POA: Diagnosis not present

## 2019-08-09 DIAGNOSIS — N183 Chronic kidney disease, stage 3 unspecified: Secondary | ICD-10-CM | POA: Insufficient documentation

## 2019-08-09 DIAGNOSIS — W19XXXA Unspecified fall, initial encounter: Secondary | ICD-10-CM | POA: Diagnosis not present

## 2019-08-09 DIAGNOSIS — Y939 Activity, unspecified: Secondary | ICD-10-CM | POA: Diagnosis not present

## 2019-08-09 LAB — CBC WITH DIFFERENTIAL/PLATELET
Abs Immature Granulocytes: 0.05 10*3/uL (ref 0.00–0.07)
Basophils Absolute: 0 10*3/uL (ref 0.0–0.1)
Basophils Relative: 0 %
Eosinophils Absolute: 0 10*3/uL (ref 0.0–0.5)
Eosinophils Relative: 0 %
HCT: 37 % (ref 36.0–46.0)
Hemoglobin: 11.5 g/dL — ABNORMAL LOW (ref 12.0–15.0)
Immature Granulocytes: 1 %
Lymphocytes Relative: 10 %
Lymphs Abs: 1.1 10*3/uL (ref 0.7–4.0)
MCH: 30.5 pg (ref 26.0–34.0)
MCHC: 31.1 g/dL (ref 30.0–36.0)
MCV: 98.1 fL (ref 80.0–100.0)
Monocytes Absolute: 0.9 10*3/uL (ref 0.1–1.0)
Monocytes Relative: 9 %
Neutro Abs: 8.4 10*3/uL — ABNORMAL HIGH (ref 1.7–7.7)
Neutrophils Relative %: 80 %
Platelets: 306 10*3/uL (ref 150–400)
RBC: 3.77 MIL/uL — ABNORMAL LOW (ref 3.87–5.11)
RDW: 12.8 % (ref 11.5–15.5)
WBC: 10.4 10*3/uL (ref 4.0–10.5)
nRBC: 0 % (ref 0.0–0.2)

## 2019-08-09 LAB — BASIC METABOLIC PANEL
Anion gap: 12 (ref 5–15)
BUN: 25 mg/dL — ABNORMAL HIGH (ref 8–23)
CO2: 25 mmol/L (ref 22–32)
Calcium: 11.2 mg/dL — ABNORMAL HIGH (ref 8.9–10.3)
Chloride: 99 mmol/L (ref 98–111)
Creatinine, Ser: 1.52 mg/dL — ABNORMAL HIGH (ref 0.44–1.00)
GFR calc Af Amer: 40 mL/min — ABNORMAL LOW (ref >60)
GFR calc non Af Amer: 34 mL/min — ABNORMAL LOW (ref >60)
Glucose, Bld: 324 mg/dL — ABNORMAL HIGH (ref 70–99)
Potassium: 4.7 mmol/L (ref 3.5–5.1)
Sodium: 136 mmol/L (ref 135–145)

## 2019-08-09 LAB — CBG MONITORING, ED: Glucose-Capillary: 265 mg/dL — ABNORMAL HIGH (ref 70–99)

## 2019-08-09 LAB — PHENOBARBITAL LEVEL: Phenobarbital: 27.5 ug/mL (ref 15.0–30.0)

## 2019-08-09 MED ORDER — SODIUM CHLORIDE 0.9 % IV BOLUS
1000.0000 mL | Freq: Once | INTRAVENOUS | Status: AC
  Administered 2019-08-09: 1000 mL via INTRAVENOUS

## 2019-08-09 MED ORDER — ONDANSETRON HCL 4 MG/2ML IJ SOLN
4.0000 mg | Freq: Once | INTRAMUSCULAR | Status: AC
  Administered 2019-08-09: 4 mg via INTRAVENOUS
  Filled 2019-08-09: qty 2

## 2019-08-09 NOTE — ED Notes (Signed)
Ructions discussed and pt ahdn husband verbalize understanding. Home stable via wc with printed instructions.

## 2019-08-09 NOTE — ED Notes (Signed)
Patient transported to radiology

## 2019-08-09 NOTE — Discharge Instructions (Signed)
Your left foot did have a mild fracture at the fifth toe today but it does not need surgery or to have a cast.  You can wear the postop shoe.  The right foot just seems to be badly bruised.  All your lab work today including your phenobarbital level was normal.  Continue to start eating and drinking again.  If you have more episodes of the nausea and vomiting you need to follow-up with your doctor within the week.  If your feet are not improving use your wheelchair as much as you need, wear the postop shoes anytime you are out of bed and follow-up with orthopedics if symptoms are not improving

## 2019-08-09 NOTE — ED Notes (Signed)
Pt taking po fluids and crackers. States nausea has resolved.

## 2019-08-09 NOTE — ED Provider Notes (Signed)
[facility] EMERGENCY DEPARTMENT Provider Note   CSN: [id number] Arrival date & time: 08/09/19  0740     History   Chief Complaint Chief Complaint  Patient presents with   Emesis   Nausea   Seizures   Foot Pain    HPI Natalie [name] is a 68 y.o. female.     Patient is a 68 year old female with a history of seizure disorder, chronic pain syndrome, diabetes, chronic kidney disease who presents today for generalized weakness, nausea and vomiting and bilateral foot pain.  Patient states on Wednesday she started having severe nausea with some vomiting but no fever, cough, shortness of breath or diarrhea.  She states when she was vomiting she would have abdominal pain but that is now subsided.  Her husband states for several days he does not think she took her phenobarbital and she has had occasional jerking here and there but he has not witnessed anything that looks like a full-blown seizure.  However patient has had multiple falls over the last 3 or 4 days.  Husband states that she gets up and does not tell him and he is not there to help her and she just loses strength in her legs and falls to the floor.  This is happened in the bathroom and is not sure where the other places have occurred.  Patient denies feeling dizzy, lightheaded or losing consciousness during these falls.  She states now the nausea and vomiting has resolved she was able to eat some soup and toast yesterday without any vomiting.  She has no abdominal pain at this time.  But she is having pain in both of her feet.  Her husband states that is difficult for her to get around because her feet are hurting but mentally she seems much more clear today.  She did take a dose of her phenobarbital this morning but has been is not sure if she took it over the last few days.  The history is provided by the patient and the spouse.  Emesis Seizures Foot Pain    Past Medical History:  Diagnosis Date   Arthritis     Atrophy of left kidney    Cervicalgia    Chronic kidney disease, stage 3    acute aki 03-14-2018 in setting pyelonehritis-- resolved   Chronic migraine    Chronic pain syndrome    History of kidney stones    History of pyelonephritis    hx recurrent   History of recurrent UTIs    History of sepsis    07/ 2017 secondary to pyelonephritis   Hypertension    Hyperthyroidism    Iron deficiency anemia    Left ureteral stone    Mixed hyperlipidemia    Nephrolithiasis    right side nonobstructive per CT 03-14-2018   Right shoulder pain    post sx 02-18-2018   Seizure disorder ([place]) followed by pcp   per pt dx age 30--  no seizure since 1980's , controlled w/ phenobarbitol   Type 2 diabetes mellitus ([place])    followed by pcp   Wears glasses     Patient Active Problem List   Diagnosis Date Noted   Chronic pain syndrome 03/16/2018   Encounter for long-term (current) use of high-risk medication [id number]   Complicated UTI (urinary tract infection) 03/14/2018   Acute renal failure superimposed on stage 3 chronic kidney disease ([place]) 03/14/2018   Hypotension 03/14/2018   Generalized weakness    Chronic kidney disease 09/06/2016  Anemia, iron deficiency 06/23/2016   Vitamin B12 deficiency (dietary) anemia 06/23/2016   Migraine, chronic, without aura, intractable, with status migrainosus 01/04/2016   Abdominal pain    Pyelonephritis 08/31/2014   Persistent microalbuminuria associated with type 2 diabetes mellitus ([place]) 12/28/2013   Renal stone 05/14/2013   BMI 33.0-33.9,adult 03/30/2013   Recurrent UTI 09/17/2012   Insulin dependent diabetes with renal manifestation 03/19/2012   HTN (hypertension) 03/19/2012   Hyperlipemia 03/19/2012   Hypothyroidism 03/19/2012   Seizure disorder ([place]) 03/19/2012   Insomnia 03/19/2012   Migraine 03/19/2012    Past Surgical History:  Procedure Laterality Date   CESAREAN SECTION  1974    CHOLECYSTECTOMY OPEN  2002   CYSTO/  LEFT RETROGRADE PYELOGRAM  06-11-2006   dr Jeffie Pollock  [place], URETEROSCOPY AND STENT PLACEMENT Left 04/10/2018   Procedure: CYSTOSCOPY WITH RETROGRADE PYELOGRAM, URETEROSCOPY AND STENT PLACEMENT;  Surgeon: Franchot Gallo, MD;  Location: [facility];  Service: Urology;  Laterality: Left;   IR URETERAL STENT PLACEMENT EXISTING ACCESS RIGHT  08-21-2007    dr Karsten Ro  [facility]   LEFT RETROGRADE PYELOGRAM/ LEFT URETERAL STENT PLACEMENT  02-25-2001   dr Jeffie Pollock [facility]   NEPHROLITHOTOMY Right 05/14/2016   Procedure: NEPHROLITHOTOMY PERCUTANEOUS;  Surgeon: Franchot Gallo, MD;  Location: [facility];  Service: Urology;  Laterality: Right;   SHOULDER ARTHROSCOPY Bilateral left 11-13-2000;  right 02-18-2018     OB History   No obstetric history on file.      Home Medications    Prior to Admission medications   Medication Sig Start Date End Date Taking? Authorizing Provider  amLODipine (NORVASC) 5 MG tablet Take 1 tablet (5 mg total) by mouth daily. Patient taking differently: Take 5 mg by mouth daily with supper.  02/08/18   Shawnee Knapp, MD  atorvastatin (LIPITOR) 40 MG tablet Take 1 tablet (40 mg total) by mouth daily at 6 PM. 08/07/18   Shawnee Knapp, MD  butorphanol (STADOL) 10 MG/ML nasal spray PLACE 1 SPRAY INTO THE NOSE EVERY 4 HOURS AS NEEDED FOR MIGRAINE. 10/08/18   Shawnee Knapp, MD  estradiol (ESTRACE) 0.1 MG/GM vaginal cream Use 1 fingertip amount to urethra, clitoris, and vaginal introitus every night x 2 weeks, then decreased to 2-3x weekly 02/08/18   Shawnee Knapp, MD  glipiZIDE (GLUCOTROL) 10 MG tablet TAKE 2 TABLETS (20 MG TOTAL) BY MOUTH 2 (TWO) TIMES DAILY BEFORE A MEAL. 11/17/18   Shawnee Knapp, MD  HUMULIN N 100 UNIT/ML injection INJECT 5 UNITS TOTAL INTO THE SKIN 2 (TWO) TIMES DAILY BEFORE A MEAL. Patient not taking: Reported on 12/01/2018 11/17/18   Rutherford Guys, MD  insulin NPH Human (HUMULIN N,NOVOLIN N)  100 UNIT/ML injection Inject 0.05 mLs (5 Units total) into the skin 2 (two) times daily before a meal. Patient not taking: Reported on 12/01/2018 10/20/18   Rutherford Guys, MD  insulin NPH Human (HUMULIN N,NOVOLIN N) 100 UNIT/ML injection Inject 0.05 mLs (5 Units total) into the skin 2 (two) times daily before a meal. 10/17/18   Rutherford Guys, MD  Insulin Syringe-Needle U-100 (INSULIN SYRINGE 1CC/31GX5/16") 31G X 5/16" 1 ML MISC 1 Units by Does not apply route as directed. Dx: E11.29 11/10/18   Shawnee Knapp, MD  levothyroxine (SYNTHROID, LEVOTHROID) 75 MCG tablet Take 1 tablet (75 mcg total) by mouth daily. 08/07/18   Shawnee Knapp, MD  olmesartan (BENICAR) 40 MG tablet Take 1 tablet (40 mg total)  by mouth daily. 11/03/18   Shawnee Knapp, MD  PHENobarbital (LUMINAL) 97.2 MG tablet TAKE 2 TABLETS BY MOUTH ONCE DAILY ON MON,WED,AND FRI AND 1 TABLET DAILY ON OTHER DAYS 08/07/18   Shawnee Knapp, MD  tiZANidine (ZANAFLEX) 4 MG tablet TAKE 1 TABLET BY MOUTH EVERY 4-6 HOURS AS NEEDED FOR MUSCLE SPASM 01/08/19   Forrest Moron, MD  Vaginal Lubricant (REPLENS) GEL Place 1 application vaginally 3 (three) times a week. 02/10/18   Shawnee Knapp, MD  vitamin B-12 1000 MCG tablet Take 1 tablet (1,000 mcg total) by mouth daily. Patient not taking: Reported on 12/01/2018 06/26/16   Janece Canterbury, MD  zolpidem (AMBIEN) 10 MG tablet take 1 tablet by mouth at bedtime for sleep if needed. 05/08/18   Shawnee Knapp, MD    Family History Family History  Problem Relation Age of Onset   Heart disease Father    Diabetes Father     Social History Social History   Tobacco Use   Smoking status: Never Smoker   Smokeless tobacco: Never Used  Substance Use Topics   Alcohol use: No    Alcohol/week: 0.0 standard drinks   Drug use: No     Allergies   Compazine, Cymbalta [duloxetine hcl], Methadone, Escitalopram, and Macrobid [nitrofurantoin monohyd macro]   Review of Systems Review of Systems  Gastrointestinal: Positive  for vomiting.  Neurological: Positive for seizures.  All other systems reviewed and are negative.    Physical Exam Updated Vital Signs BP (!) 167/73    Pulse 81    Temp 99.2 F (37.3 C) (Oral)    Resp 14    Ht 4\' 8"  (1.422 m)    Wt 81.6 kg    SpO2 96%    BMI 40.36 kg/m   Physical Exam Vitals signs and nursing note reviewed.  Constitutional:      General: She is not in acute distress.    Appearance: She is well-developed and normal weight.  HENT:     Head: Normocephalic and atraumatic.  Eyes:     Pupils: Pupils are equal, round, and reactive to light.  Cardiovascular:     Rate and Rhythm: Normal rate and regular rhythm.     Pulses: Normal pulses.     Heart sounds: Normal heart sounds. No murmur. No friction rub.  Pulmonary:     Effort: Pulmonary effort is normal.     Breath sounds: Normal breath sounds. No wheezing or rales.  Abdominal:     General: Bowel sounds are normal. There is no distension.     Palpations: Abdomen is soft.     Tenderness: There is no abdominal tenderness. There is no guarding or rebound.  Musculoskeletal: Normal range of motion.        General: Tenderness present.     Right hip: Normal.     Left hip: Normal.     Right knee: Normal.     Left knee: Normal.     Right foot: Normal range of motion. Tenderness, bony tenderness and swelling present.     Left foot: Normal range of motion. Tenderness, bony tenderness and swelling present.       Feet:     Comments: No edema  Skin:    General: Skin is warm and dry.     Findings: No rash.  Neurological:     General: No focal deficit present.     Mental Status: She is alert and oriented to person, place, and time. Mental status  is at baseline.     Cranial Nerves: No cranial nerve deficit.     Motor: No weakness.     Comments: Able to lift both legs bilaterally with 4/5 strength with mild effort dependence.  Sensation intact in the bilateral arms and legs.  No slurred speech.  Psychiatric:         Behavior: Behavior normal.      ED Treatments / Results  Labs (all labs ordered are listed, but only abnormal results are displayed) Labs Reviewed  BASIC METABOLIC PANEL - Abnormal; Notable for the following components:      Result Value   Glucose, Bld 324 (*)    BUN 25 (*)    Creatinine, Ser 1.52 (*)    Calcium 11.2 (*)    GFR calc non Af Amer 34 (*)    GFR calc Af Amer 40 (*)    All other components within normal limits  CBC WITH DIFFERENTIAL/PLATELET - Abnormal; Notable for the following components:   RBC 3.77 (*)    Hemoglobin 11.5 (*)    Neutro Abs 8.4 (*)    All other components within normal limits  CBG MONITORING, ED - Abnormal; Notable for the following components:   Glucose-Capillary 265 (*)    All other components within normal limits  PHENOBARBITAL LEVEL    EKG EKG Interpretation  Date/Time:  Sunday August 09 2019 09:18:09 EST Ventricular Rate:  82 PR Interval:    QRS Duration: 79 QT Interval:  478 QTC Calculation: 559 R Axis:   -43 Text Interpretation: Sinus rhythm Abnormal R-wave progression, early transition Inferior infarct, age indeterminate new Prolonged QT interval Confirmed by Blanchie Dessert [id number]) on 08/09/2019 9:50:53 AM   Radiology Ct Head Wo Contrast  Result Date: 08/09/2019 CLINICAL DATA:  Seizure 4 days ago with fall. EXAM: CT HEAD WITHOUT CONTRAST TECHNIQUE: Contiguous axial images were obtained from the base of the skull through the vertex without intravenous contrast. COMPARISON:  08/31/2014 FINDINGS: Brain: Examination demonstrates age related atrophic change in mild chronic ischemic microvascular disease which is stable. There are a couple small lacunar infarcts over the basal ganglia bilaterally. There is no mass, mass effect, shift of midline structures or acute hemorrhage. No evidence of acute infarction. Vascular: No hyperdense vessel or unexpected calcification. Skull: Normal. Negative for fracture or focal lesion. Sinuses/Orbits:  No acute finding. Other: None. IMPRESSION: 1.  No acute findings. 2. Chronic ischemic microvascular disease and age related atrophic change. Couple small old lacunar infarcts over the basal ganglia. Electronically Signed   By: Marin Olp M.D.   On: 08/09/2019 10:20   Dg Foot Complete Left  Result Date: 08/09/2019 CLINICAL DATA:  Fall with left foot pain and swelling. EXAM: LEFT FOOT - COMPLETE 3+ VIEW COMPARISON:  None. FINDINGS: Minimal degenerative change over the first MTP joint, interphalangeal joints and midfoot. There is a minimally displaced fracture over the distal metaphysis of the fifth metatarsal. Remainder of the exam is unremarkable. IMPRESSION: Minimally displaced fracture at the distal metaphysis fifth metatarsal. Electronically Signed   By: Marin Olp M.D.   On: 08/09/2019 10:17   Dg Foot Complete Right  Result Date: 08/09/2019 CLINICAL DATA:  Vomiting 4 days. Recent seizure with right foot injury. EXAM: RIGHT FOOT COMPLETE - 3+ VIEW COMPARISON:  04/07/2010 FINDINGS: Minimal degenerative changes over the first MTP joint and interphalangeal joints. Mild degenerate change over the midfoot. Small inferior calcaneal spur. No acute fracture or dislocation. IMPRESSION: No acute findings. Electronically Signed   By: Quillian Quince  Derrel Nip M.D.   On: 08/09/2019 08:59    Procedures Procedures (including critical care time)  Medications Ordered in ED Medications  sodium chloride 0.9 % bolus 1,000 mL (has no administration in time range)  ondansetron (ZOFRAN) injection 4 mg (has no administration in time range)     Initial Impression / Assessment and Plan / ED Course  I have reviewed the triage vital signs and the nursing notes.  Pertinent labs & imaging results that were available during my care of the patient were reviewed by me and considered in my medical decision making (see chart for details).        Elderly female presenting today with multiple complaints.  Nausea and vomiting  seems to be improved and patient has been tolerating oral fluids and foods and vomiting and abdominal pain has seemed to subside however patient was not taking meds regularly for several days and has been generally weak.  Husband states her cognition seems much better today and she did have her phenobarbital today.  Husband has not witnessed any episodes that appear to be seizures and her seizures are fairly infrequent.  She has no abdominal pain on exam today and low suspicion for acute intra-abdominal process at this time.  She had no fever or diarrhea.  She is well-appearing and able to answer questions independently.  She is able to move all extremities and has no focal neuro deficits.  She does have significant bruising in pain to bilateral feet and will x-ray to ensure no acute fracture.  Also given husband was unable to witness some of the falls we will scan her head to ensure no injury.  Patient's labs are reassuring with a normal phenobarbital level, sodium, potassium and renal function however patient is hyperglycemic today of 324.  Patient's EKG without evidence of acute abnormality with low suspicion that the nausea and vomiting was related to a cardiac cause.   12:30 PM Patient's EKG shows QT prolongation which is most likely medication related.  CT of the head was negative and plain film of the left foot shows a distal fifth metatarsal fracture.  Right foot without acute fracture.  Patient was placed in postop shoe and given orthopedic follow-up.  She received 1 L of fluid and is feeling better.  She was encouraged to follow-up with her doctor if she continues to have recurrent episodes of nausea and vomiting. Final Clinical Impressions(s) / ED Diagnoses   Final diagnoses:  Nondisplaced fracture of fifth metatarsal bone, left foot, initial encounter for closed fracture  Dehydration    ED Discharge Orders    None       Blanchie Dessert, MD 08/09/19 1339

## 2019-08-09 NOTE — ED Triage Notes (Signed)
Pt. Stated, Natalie [name] been throwing up since Wed. I missed taking my Phenobarbital  on Wed or Thursday and took it on Friday, had a seizure on Thursday and fell and hurt my rt. Foot. My N/V are gone and Im back on track with my medicine. Mostly my foot is hurting.  Husband stated, she has fallen several times since she started having N/V. Her comprehension has decreased in the last year.

## 2019-08-09 NOTE — ED Notes (Addendum)
Iv attempted x 2 without success. 

## 2019-08-25 DIAGNOSIS — S93601A Unspecified sprain of right foot, initial encounter: Secondary | ICD-10-CM | POA: Diagnosis not present

## 2019-08-25 DIAGNOSIS — M79671 Pain in right foot: Secondary | ICD-10-CM | POA: Diagnosis not present

## 2019-09-02 ENCOUNTER — Other Ambulatory Visit: Payer: Self-pay | Admitting: Orthopaedic Surgery

## 2019-09-02 DIAGNOSIS — S92301A Fracture of unspecified metatarsal bone(s), right foot, initial encounter for closed fracture: Secondary | ICD-10-CM | POA: Diagnosis not present

## 2019-09-03 ENCOUNTER — Other Ambulatory Visit: Payer: Self-pay | Admitting: Orthopaedic Surgery

## 2019-09-03 DIAGNOSIS — T148XXA Other injury of unspecified body region, initial encounter: Secondary | ICD-10-CM

## 2019-09-04 ENCOUNTER — Encounter (HOSPITAL_BASED_OUTPATIENT_CLINIC_OR_DEPARTMENT_OTHER): Payer: Self-pay

## 2019-09-04 ENCOUNTER — Other Ambulatory Visit: Payer: Self-pay

## 2019-09-07 ENCOUNTER — Other Ambulatory Visit: Payer: Self-pay

## 2019-09-07 ENCOUNTER — Ambulatory Visit
Admission: RE | Admit: 2019-09-07 | Discharge: 2019-09-07 | Disposition: A | Discharge status: Home / Self Care | Payer: Medicare Other | Source: Ambulatory Visit | Attending: Orthopaedic Surgery | Admitting: Orthopaedic Surgery

## 2019-09-07 ENCOUNTER — Other Ambulatory Visit (HOSPITAL_COMMUNITY)
Admission: RE | Admit: 2019-09-07 | Discharge: 2019-09-07 | Disposition: A | Discharge status: Home / Self Care | Payer: Medicare Other | Source: Ambulatory Visit | Attending: Orthopaedic Surgery | Admitting: Orthopaedic Surgery

## 2019-09-07 ENCOUNTER — Encounter (HOSPITAL_BASED_OUTPATIENT_CLINIC_OR_DEPARTMENT_OTHER)
Admission: RE | Admit: 2019-09-07 | Discharge: 2019-09-07 | Disposition: A | Discharge status: Home / Self Care | Payer: Medicare Other | Source: Ambulatory Visit | Attending: Orthopaedic Surgery | Admitting: Orthopaedic Surgery

## 2019-09-07 DIAGNOSIS — S93324A Dislocation of tarsometatarsal joint of right foot, initial encounter: Secondary | ICD-10-CM | POA: Diagnosis not present

## 2019-09-07 DIAGNOSIS — Z79899 Other long term (current) drug therapy: Secondary | ICD-10-CM | POA: Diagnosis not present

## 2019-09-07 DIAGNOSIS — N183 Chronic kidney disease, stage 3 unspecified: Secondary | ICD-10-CM | POA: Diagnosis not present

## 2019-09-07 DIAGNOSIS — Z888 Allergy status to other drugs, medicaments and biological substances status: Secondary | ICD-10-CM | POA: Diagnosis not present

## 2019-09-07 DIAGNOSIS — Z01812 Encounter for preprocedural laboratory examination: Secondary | ICD-10-CM | POA: Diagnosis not present

## 2019-09-07 DIAGNOSIS — E1122 Type 2 diabetes mellitus with diabetic chronic kidney disease: Secondary | ICD-10-CM | POA: Diagnosis not present

## 2019-09-07 DIAGNOSIS — S92221A Displaced fracture of lateral cuneiform of right foot, initial encounter for closed fracture: Secondary | ICD-10-CM | POA: Diagnosis not present

## 2019-09-07 DIAGNOSIS — Z881 Allergy status to other antibiotic agents status: Secondary | ICD-10-CM | POA: Diagnosis not present

## 2019-09-07 DIAGNOSIS — S92241A Displaced fracture of medial cuneiform of right foot, initial encounter for closed fracture: Secondary | ICD-10-CM | POA: Diagnosis not present

## 2019-09-07 DIAGNOSIS — Z833 Family history of diabetes mellitus: Secondary | ICD-10-CM | POA: Diagnosis not present

## 2019-09-07 DIAGNOSIS — Z794 Long term (current) use of insulin: Secondary | ICD-10-CM | POA: Diagnosis not present

## 2019-09-07 DIAGNOSIS — G40909 Epilepsy, unspecified, not intractable, without status epilepticus: Secondary | ICD-10-CM | POA: Diagnosis not present

## 2019-09-07 DIAGNOSIS — S92311A Displaced fracture of first metatarsal bone, right foot, initial encounter for closed fracture: Secondary | ICD-10-CM | POA: Diagnosis not present

## 2019-09-07 DIAGNOSIS — Z79890 Hormone replacement therapy (postmenopausal): Secondary | ICD-10-CM | POA: Diagnosis not present

## 2019-09-07 DIAGNOSIS — M1990 Unspecified osteoarthritis, unspecified site: Secondary | ICD-10-CM | POA: Diagnosis not present

## 2019-09-07 DIAGNOSIS — E782 Mixed hyperlipidemia: Secondary | ICD-10-CM | POA: Diagnosis not present

## 2019-09-07 DIAGNOSIS — S92211A Displaced fracture of cuboid bone of right foot, initial encounter for closed fracture: Secondary | ICD-10-CM | POA: Diagnosis not present

## 2019-09-07 DIAGNOSIS — S92341A Displaced fracture of fourth metatarsal bone, right foot, initial encounter for closed fracture: Secondary | ICD-10-CM | POA: Diagnosis not present

## 2019-09-07 DIAGNOSIS — S92321A Displaced fracture of second metatarsal bone, right foot, initial encounter for closed fracture: Secondary | ICD-10-CM | POA: Diagnosis not present

## 2019-09-07 DIAGNOSIS — I129 Hypertensive chronic kidney disease with stage 1 through stage 4 chronic kidney disease, or unspecified chronic kidney disease: Secondary | ICD-10-CM | POA: Diagnosis not present

## 2019-09-07 DIAGNOSIS — Z20828 Contact with and (suspected) exposure to other viral communicable diseases: Secondary | ICD-10-CM | POA: Diagnosis not present

## 2019-09-07 DIAGNOSIS — E039 Hypothyroidism, unspecified: Secondary | ICD-10-CM | POA: Diagnosis not present

## 2019-09-07 DIAGNOSIS — G894 Chronic pain syndrome: Secondary | ICD-10-CM | POA: Diagnosis not present

## 2019-09-07 DIAGNOSIS — T148XXA Other injury of unspecified body region, initial encounter: Secondary | ICD-10-CM

## 2019-09-07 DIAGNOSIS — W19XXXA Unspecified fall, initial encounter: Secondary | ICD-10-CM | POA: Diagnosis not present

## 2019-09-07 DIAGNOSIS — Z8249 Family history of ischemic heart disease and other diseases of the circulatory system: Secondary | ICD-10-CM | POA: Diagnosis not present

## 2019-09-07 LAB — BASIC METABOLIC PANEL
Anion gap: 9 (ref 5–15)
BUN: 24 mg/dL — ABNORMAL HIGH (ref 8–23)
CO2: 25 mmol/L (ref 22–32)
Calcium: 10.2 mg/dL (ref 8.9–10.3)
Chloride: 108 mmol/L (ref 98–111)
Creatinine, Ser: 1.35 mg/dL — ABNORMAL HIGH (ref 0.44–1.00)
GFR calc Af Amer: 46 mL/min — ABNORMAL LOW (ref >60)
GFR calc non Af Amer: 40 mL/min — ABNORMAL LOW (ref >60)
Glucose, Bld: 61 mg/dL — ABNORMAL LOW (ref 70–99)
Potassium: 5 mmol/L (ref 3.5–5.1)
Sodium: 142 mmol/L (ref 135–145)

## 2019-09-07 NOTE — Progress Notes (Signed)

## 2019-09-08 LAB — NOVEL CORONAVIRUS, NAA (HOSP ORDER, SEND-OUT TO REF LAB; TAT 18-24 HRS): SARS-CoV-2, NAA: NOT DETECTED

## 2019-09-10 ENCOUNTER — Ambulatory Visit (HOSPITAL_BASED_OUTPATIENT_CLINIC_OR_DEPARTMENT_OTHER)
Admission: RE | Admit: 2019-09-10 | Discharge: 2019-09-10 | Disposition: A | Discharge status: Home / Self Care | Payer: Medicare Other | Attending: Orthopaedic Surgery | Admitting: Orthopaedic Surgery

## 2019-09-10 ENCOUNTER — Encounter (HOSPITAL_BASED_OUTPATIENT_CLINIC_OR_DEPARTMENT_OTHER)
Admission: RE | Disposition: A | Discharge status: Home / Self Care | Payer: Self-pay | Source: Home / Self Care | Attending: Orthopaedic Surgery

## 2019-09-10 ENCOUNTER — Other Ambulatory Visit: Payer: Self-pay

## 2019-09-10 ENCOUNTER — Ambulatory Visit (HOSPITAL_BASED_OUTPATIENT_CLINIC_OR_DEPARTMENT_OTHER): Payer: Medicare Other | Admitting: Anesthesiology

## 2019-09-10 ENCOUNTER — Ambulatory Visit (HOSPITAL_COMMUNITY): Payer: Medicare Other

## 2019-09-10 ENCOUNTER — Encounter (HOSPITAL_BASED_OUTPATIENT_CLINIC_OR_DEPARTMENT_OTHER): Payer: Self-pay | Admitting: Orthopaedic Surgery

## 2019-09-10 DIAGNOSIS — Z833 Family history of diabetes mellitus: Secondary | ICD-10-CM | POA: Insufficient documentation

## 2019-09-10 DIAGNOSIS — G8918 Other acute postprocedural pain: Secondary | ICD-10-CM | POA: Diagnosis not present

## 2019-09-10 DIAGNOSIS — S92241A Displaced fracture of medial cuneiform of right foot, initial encounter for closed fracture: Secondary | ICD-10-CM | POA: Diagnosis not present

## 2019-09-10 DIAGNOSIS — S92211A Displaced fracture of cuboid bone of right foot, initial encounter for closed fracture: Secondary | ICD-10-CM | POA: Diagnosis not present

## 2019-09-10 DIAGNOSIS — S92321A Displaced fracture of second metatarsal bone, right foot, initial encounter for closed fracture: Secondary | ICD-10-CM | POA: Insufficient documentation

## 2019-09-10 DIAGNOSIS — N183 Chronic kidney disease, stage 3 unspecified: Secondary | ICD-10-CM | POA: Insufficient documentation

## 2019-09-10 DIAGNOSIS — S92341A Displaced fracture of fourth metatarsal bone, right foot, initial encounter for closed fracture: Secondary | ICD-10-CM | POA: Insufficient documentation

## 2019-09-10 DIAGNOSIS — S92311A Displaced fracture of first metatarsal bone, right foot, initial encounter for closed fracture: Secondary | ICD-10-CM | POA: Insufficient documentation

## 2019-09-10 DIAGNOSIS — Z8249 Family history of ischemic heart disease and other diseases of the circulatory system: Secondary | ICD-10-CM | POA: Insufficient documentation

## 2019-09-10 DIAGNOSIS — M1990 Unspecified osteoarthritis, unspecified site: Secondary | ICD-10-CM | POA: Diagnosis not present

## 2019-09-10 DIAGNOSIS — G894 Chronic pain syndrome: Secondary | ICD-10-CM | POA: Diagnosis not present

## 2019-09-10 DIAGNOSIS — S92221A Displaced fracture of lateral cuneiform of right foot, initial encounter for closed fracture: Secondary | ICD-10-CM | POA: Insufficient documentation

## 2019-09-10 DIAGNOSIS — E782 Mixed hyperlipidemia: Secondary | ICD-10-CM | POA: Diagnosis not present

## 2019-09-10 DIAGNOSIS — Z794 Long term (current) use of insulin: Secondary | ICD-10-CM | POA: Insufficient documentation

## 2019-09-10 DIAGNOSIS — Z881 Allergy status to other antibiotic agents status: Secondary | ICD-10-CM | POA: Insufficient documentation

## 2019-09-10 DIAGNOSIS — S92301A Fracture of unspecified metatarsal bone(s), right foot, initial encounter for closed fracture: Secondary | ICD-10-CM | POA: Diagnosis not present

## 2019-09-10 DIAGNOSIS — W19XXXA Unspecified fall, initial encounter: Secondary | ICD-10-CM | POA: Insufficient documentation

## 2019-09-10 DIAGNOSIS — Z79899 Other long term (current) drug therapy: Secondary | ICD-10-CM | POA: Insufficient documentation

## 2019-09-10 DIAGNOSIS — S93324A Dislocation of tarsometatarsal joint of right foot, initial encounter: Secondary | ICD-10-CM | POA: Diagnosis not present

## 2019-09-10 DIAGNOSIS — I129 Hypertensive chronic kidney disease with stage 1 through stage 4 chronic kidney disease, or unspecified chronic kidney disease: Secondary | ICD-10-CM | POA: Diagnosis not present

## 2019-09-10 DIAGNOSIS — E039 Hypothyroidism, unspecified: Secondary | ICD-10-CM | POA: Diagnosis not present

## 2019-09-10 DIAGNOSIS — E1122 Type 2 diabetes mellitus with diabetic chronic kidney disease: Secondary | ICD-10-CM | POA: Diagnosis not present

## 2019-09-10 DIAGNOSIS — Z79890 Hormone replacement therapy (postmenopausal): Secondary | ICD-10-CM | POA: Insufficient documentation

## 2019-09-10 DIAGNOSIS — Z888 Allergy status to other drugs, medicaments and biological substances status: Secondary | ICD-10-CM | POA: Insufficient documentation

## 2019-09-10 DIAGNOSIS — G40909 Epilepsy, unspecified, not intractable, without status epilepticus: Secondary | ICD-10-CM | POA: Insufficient documentation

## 2019-09-10 DIAGNOSIS — Z419 Encounter for procedure for purposes other than remedying health state, unspecified: Secondary | ICD-10-CM

## 2019-09-10 HISTORY — PX: REPAIR EXTENSOR TENDON WITH METATARSAL OSTEOTOMY AND OPEN REDUCTION IN: SHX5698

## 2019-09-10 HISTORY — PX: OPEN REDUCTION INTERNAL FIXATION (ORIF) FOOT LISFRANC FRACTURE: SHX5990

## 2019-09-10 HISTORY — DX: Hypothyroidism, unspecified: E03.9

## 2019-09-10 HISTORY — PX: FOOT ARTHRODESIS: SHX1655

## 2019-09-10 LAB — GLUCOSE, CAPILLARY
Glucose-Capillary: 92 mg/dL (ref 70–99)
Glucose-Capillary: 253 mg/dL — ABNORMAL HIGH (ref 70–99)
Glucose-Capillary: 257 mg/dL — ABNORMAL HIGH (ref 70–99)
Glucose-Capillary: 205 mg/dL — ABNORMAL HIGH (ref 70–99)

## 2019-09-10 SURGERY — REPAIR EXTENSOR TENDON WITH METATARSAL OSTEOTOMY AND OPEN REDUCTION INTERNAL FIXATION (ORIF) METATARSAL
Anesthesia: General | Site: Foot | Laterality: Right

## 2019-09-10 MED ORDER — LIDOCAINE 2% (20 MG/ML) 5 ML SYRINGE
INTRAMUSCULAR | Status: AC
  Filled 2019-09-10: qty 5

## 2019-09-10 MED ORDER — INSULIN ASPART 100 UNIT/ML ~~LOC~~ SOLN
10.0000 [IU] | Freq: Once | SUBCUTANEOUS | Status: AC
  Administered 2019-09-10: 10 [IU] via SUBCUTANEOUS
  Filled 2019-09-10: qty 1

## 2019-09-10 MED ORDER — FENTANYL CITRATE (PF) 100 MCG/2ML IJ SOLN
50.0000 ug | INTRAMUSCULAR | Status: DC | PRN
  Administered 2019-09-10: 11:00:00 50 ug via INTRAVENOUS

## 2019-09-10 MED ORDER — ONDANSETRON HCL 4 MG/2ML IJ SOLN: 4.0000 mg | Freq: Once | INTRAMUSCULAR | Status: DC | PRN

## 2019-09-10 MED ORDER — OXYCODONE HCL 5 MG PO TABS: 5.0000 mg | ORAL_TABLET | Freq: Once | ORAL | Status: DC | PRN

## 2019-09-10 MED ORDER — BUPIVACAINE-EPINEPHRINE (PF) 0.5% -1:200000 IJ SOLN
INTRAMUSCULAR | Status: DC | PRN
  Administered 2019-09-10 (×2): 20 mL

## 2019-09-10 MED ORDER — ONDANSETRON HCL 4 MG/2ML IJ SOLN
INTRAMUSCULAR | Status: DC | PRN
  Administered 2019-09-10: 4 mg via INTRAVENOUS

## 2019-09-10 MED ORDER — MIDAZOLAM HCL 2 MG/2ML IJ SOLN
1.0000 mg | INTRAMUSCULAR | Status: DC | PRN
  Administered 2019-09-10: 11:00:00 1 mg via INTRAVENOUS

## 2019-09-10 MED ORDER — PROPOFOL 10 MG/ML IV BOLUS
INTRAVENOUS | Status: AC
  Filled 2019-09-10: qty 20

## 2019-09-10 MED ORDER — FENTANYL CITRATE (PF) 100 MCG/2ML IJ SOLN
INTRAMUSCULAR | Status: AC
  Filled 2019-09-10: qty 2

## 2019-09-10 MED ORDER — CEFAZOLIN SODIUM-DEXTROSE 2-4 GM/100ML-% IV SOLN
2.0000 g | INTRAVENOUS | Status: AC
  Administered 2019-09-10: 2 g via INTRAVENOUS

## 2019-09-10 MED ORDER — ONDANSETRON HCL 4 MG/2ML IJ SOLN
INTRAMUSCULAR | Status: AC
  Filled 2019-09-10: qty 2

## 2019-09-10 MED ORDER — OXYCODONE HCL 5 MG PO TABS: 5.0000 mg | ORAL_TABLET | ORAL | 0 refills | Status: AC | PRN

## 2019-09-10 MED ORDER — DEXAMETHASONE SODIUM PHOSPHATE 10 MG/ML IJ SOLN
INTRAMUSCULAR | Status: AC
  Filled 2019-09-10: qty 1

## 2019-09-10 MED ORDER — LACTATED RINGERS IV SOLN
INTRAVENOUS | Status: DC
  Administered 2019-09-10: 11:00:00 via INTRAVENOUS

## 2019-09-10 MED ORDER — DEXAMETHASONE SODIUM PHOSPHATE 4 MG/ML IJ SOLN
INTRAMUSCULAR | Status: DC | PRN
  Administered 2019-09-10: 5 mg via INTRAVENOUS

## 2019-09-10 MED ORDER — CEFAZOLIN SODIUM-DEXTROSE 2-4 GM/100ML-% IV SOLN
INTRAVENOUS | Status: AC
  Filled 2019-09-10: qty 100

## 2019-09-10 MED ORDER — PHENYLEPHRINE 40 MCG/ML (10ML) SYRINGE FOR IV PUSH (FOR BLOOD PRESSURE SUPPORT)
PREFILLED_SYRINGE | INTRAVENOUS | Status: AC
  Filled 2019-09-10: qty 10

## 2019-09-10 MED ORDER — OXYCODONE HCL 5 MG/5ML PO SOLN: 5.0000 mg | Freq: Once | ORAL | Status: DC | PRN

## 2019-09-10 MED ORDER — FENTANYL CITRATE (PF) 100 MCG/2ML IJ SOLN: 25.0000 ug | INTRAMUSCULAR | Status: DC | PRN

## 2019-09-10 MED ORDER — PHENYLEPHRINE 40 MCG/ML (10ML) SYRINGE FOR IV PUSH (FOR BLOOD PRESSURE SUPPORT)
PREFILLED_SYRINGE | INTRAVENOUS | Status: DC | PRN
  Administered 2019-09-10 (×3): 80 ug via INTRAVENOUS

## 2019-09-10 MED ORDER — MIDAZOLAM HCL 2 MG/2ML IJ SOLN
INTRAMUSCULAR | Status: AC
  Filled 2019-09-10: qty 2

## 2019-09-10 MED ORDER — LIDOCAINE 2% (20 MG/ML) 5 ML SYRINGE
INTRAMUSCULAR | Status: DC | PRN
  Administered 2019-09-10: 40 mg via INTRAVENOUS

## 2019-09-10 MED ORDER — PROPOFOL 10 MG/ML IV BOLUS
INTRAVENOUS | Status: DC | PRN
  Administered 2019-09-10: 50 mg via INTRAVENOUS
  Administered 2019-09-10: 150 mg via INTRAVENOUS

## 2019-09-10 MED ORDER — FENTANYL CITRATE (PF) 100 MCG/2ML IJ SOLN
INTRAMUSCULAR | Status: DC | PRN
  Administered 2019-09-10 (×4): 25 ug via INTRAVENOUS

## 2019-09-10 MED ORDER — POVIDONE-IODINE 10 % EX SWAB: 2.0000 "application " | Freq: Once | CUTANEOUS | Status: DC

## 2019-09-10 MED ORDER — ASPIRIN 325 MG PO TABS: 325.0000 mg | ORAL_TABLET | Freq: Every day | ORAL | 0 refills | Status: AC

## 2019-09-10 SURGICAL SUPPLY — 81 items
APL PRP STRL LF DISP 70% ISPRP (MISCELLANEOUS) ×2
APL SKNCLS STERI-STRIP NONHPOA (GAUZE/BANDAGES/DRESSINGS)
BANDAGE ESMARK 6X9 LF (GAUZE/BANDAGES/DRESSINGS) IMPLANT
BENZOIN TINCTURE PRP APPL 2/3 (GAUZE/BANDAGES/DRESSINGS) IMPLANT
BIT DRILL 2.6 CANN (BIT) ×3 IMPLANT
BIT DRILL CANN F/COMP 2.2 (BIT) ×3 IMPLANT
BLADE AVERAGE 25MMX9MM (BLADE)
BLADE AVERAGE 25X9 (BLADE) IMPLANT
BLADE SURG 15 STRL LF DISP TIS (BLADE) ×7 IMPLANT
BLADE SURG 15 STRL SS (BLADE) ×16
BNDG CMPR 9X4 STRL LF SNTH (GAUZE/BANDAGES/DRESSINGS)
BNDG CMPR 9X6 STRL LF SNTH (GAUZE/BANDAGES/DRESSINGS)
BNDG COHESIVE 4X5 TAN STRL (GAUZE/BANDAGES/DRESSINGS) IMPLANT
BNDG ELASTIC 6X5.8 VLCR STR LF (GAUZE/BANDAGES/DRESSINGS) ×8 IMPLANT
BNDG ESMARK 4X9 LF (GAUZE/BANDAGES/DRESSINGS) IMPLANT
BNDG ESMARK 6X9 LF (GAUZE/BANDAGES/DRESSINGS)
CHLORAPREP W/TINT 26 (MISCELLANEOUS) ×4 IMPLANT
CLOSURE WOUND 1/2 X4 (GAUZE/BANDAGES/DRESSINGS)
COVER BACK TABLE REUSABLE LG (DRAPES) ×4 IMPLANT
COVER MAYO STAND REUSABLE (DRAPES) IMPLANT
COVER WAND RF STERILE (DRAPES) IMPLANT
CUFF TOURN SGL QUICK 34 (TOURNIQUET CUFF) ×4
CUFF TRNQT CYL 34X4.125X (TOURNIQUET CUFF) ×2 IMPLANT
DECANTER SPIKE VIAL GLASS SM (MISCELLANEOUS) IMPLANT
DRAPE C-ARM 42X72 X-RAY (DRAPES) ×4 IMPLANT
DRAPE C-ARMOR (DRAPES) ×4 IMPLANT
DRAPE EXTREMITY T 121X128X90 (DISPOSABLE) ×4 IMPLANT
DRAPE HALF SHEET 70X43 (DRAPES) ×4 IMPLANT
DRAPE IMP U-DRAPE 54X76 (DRAPES) ×4 IMPLANT
DRAPE U-SHAPE 47X51 STRL (DRAPES) ×4 IMPLANT
ELECT REM PT RETURN 9FT ADLT (ELECTROSURGICAL) ×4
ELECTRODE REM PT RTRN 9FT ADLT (ELECTROSURGICAL) ×2 IMPLANT
GAUZE SPONGE 4X4 12PLY STRL (GAUZE/BANDAGES/DRESSINGS) ×4 IMPLANT
GAUZE XEROFORM 1X8 LF (GAUZE/BANDAGES/DRESSINGS) ×4 IMPLANT
GLOVE BIOGEL M STRL SZ7.5 (GLOVE) ×4 IMPLANT
GLOVE BIOGEL PI IND STRL 8 (GLOVE) ×2 IMPLANT
GLOVE BIOGEL PI INDICATOR 8 (GLOVE) ×2
GOWN STRL REUS W/ TWL LRG LVL3 (GOWN DISPOSABLE) ×2 IMPLANT
GOWN STRL REUS W/ TWL XL LVL3 (GOWN DISPOSABLE) ×2 IMPLANT
GOWN STRL REUS W/TWL LRG LVL3 (GOWN DISPOSABLE) ×4
GOWN STRL REUS W/TWL XL LVL3 (GOWN DISPOSABLE) ×4
GUIDEWIRE 1.35MM (WIRE) ×3 IMPLANT
GUIDEWIRE 1.6 (WIRE) ×8
GUIDEWIRE ORTH 157X1.6XTROC (WIRE) ×2 IMPLANT
IMPL STAPLE SUPERMX 18X15 (Staple) ×1 IMPLANT
IMPLANT STAPLE SUPERMX 18X15 (Staple) ×4 IMPLANT
NS IRRIG 1000ML POUR BTL (IV SOLUTION) ×4 IMPLANT
PACK BASIN DAY SURGERY FS (CUSTOM PROCEDURE TRAY) ×4 IMPLANT
PAD CAST 4YDX4 CTTN HI CHSV (CAST SUPPLIES) ×2 IMPLANT
PADDING CAST COTTON 4X4 STRL (CAST SUPPLIES) ×4
PADDING CAST SYNTHETIC 4 (CAST SUPPLIES) ×2
PADDING CAST SYNTHETIC 4X4 STR (CAST SUPPLIES) ×2 IMPLANT
PENCIL SMOKE EVACUATOR (MISCELLANEOUS) ×4 IMPLANT
PLATE DYNANITE 4H STR 20 (Plate) ×3 IMPLANT
PUTTY DBM ALLOSYNC PURE 5CC (Putty) ×3 IMPLANT
SCREW LOCK QF ANKLE 3X30 (Screw) ×6 IMPLANT
SCREW LOCKING 3MMX18MM (Screw) ×3 IMPLANT
SCREW LP CORT 3.0X26MM (Screw) ×3 IMPLANT
SCREW LP LOCK 3.0X26MM (Screw) ×3 IMPLANT
SCREW LP LOCKING 3X20MM (Screw) ×3 IMPLANT
SCREW LP TITANIUM 3.5X40 (Screw) ×3 IMPLANT
SLEEVE SCD COMPRESS KNEE MED (MISCELLANEOUS) ×4 IMPLANT
SPLINT FAST PLASTER 5X30 (CAST SUPPLIES) ×40
SPLINT PLASTER CAST FAST 5X30 (CAST SUPPLIES) ×40 IMPLANT
SPONGE LAP 18X18 RF (DISPOSABLE) ×4 IMPLANT
STAPLE SUPERMX NITI 20X15 (Staple) ×3 IMPLANT
STOCKINETTE 6  STRL (DRAPES) ×2
STOCKINETTE 6 STRL (DRAPES) ×2 IMPLANT
STRIP CLOSURE SKIN 1/2X4 (GAUZE/BANDAGES/DRESSINGS) IMPLANT
SUCTION FRAZIER HANDLE 10FR (MISCELLANEOUS) ×2
SUCTION TUBE FRAZIER 10FR DISP (MISCELLANEOUS) ×2 IMPLANT
SUT ETHILON 3 0 PS 1 (SUTURE) ×4 IMPLANT
SUT MNCRL AB 3-0 PS2 18 (SUTURE) ×4 IMPLANT
SUT PDS AB 2-0 CT2 27 (SUTURE) ×4 IMPLANT
SUT VIC AB 3-0 FS2 27 (SUTURE) IMPLANT
SYR 5ML LUER SLIP (SYRINGE) ×3 IMPLANT
SYR BULB 3OZ (MISCELLANEOUS) ×4 IMPLANT
TOWEL GREEN STERILE FF (TOWEL DISPOSABLE) ×8 IMPLANT
TUBE CONNECTING 20'X1/4 (TUBING) ×1
TUBE CONNECTING 20X1/4 (TUBING) ×3 IMPLANT
UNDERPAD 30X36 HEAVY ABSORB (UNDERPADS AND DIAPERS) ×4 IMPLANT

## 2019-09-10 NOTE — Anesthesia Procedure Notes (Signed)
Anesthesia Regional Block: Popliteal block   Pre-Anesthetic Checklist: ,, timeout performed, Correct Patient, Correct Site, Correct Laterality, Correct Procedure, Correct Position, site marked, Risks and benefits discussed,  Surgical consent,  Pre-op evaluation,  At surgeon's request and post-op pain management  Laterality: Right  Prep: chloraprep       Needles:  Injection technique: Single-shot  Needle Type: Echogenic Stimulator Needle     Needle Length: 9cm  Needle Gauge: 21   Needle insertion depth: 7 cm   Additional Needles:   Procedures:,,,, ultrasound used (permanent image in chart),,,,  Narrative:  Start time: 09/10/2019 10:58 AM End time: 09/10/2019 11:02 AM Injection made incrementally with aspirations every 5 mL.  Performed by: Personally  Anesthesiologist: Josephine Igo, MD  Additional Notes: Timeout performed. Patient sedated. Relevant anatomy ID'd using [place]. Incremental 2-44ml injection of LA with frequent aspiration. Patient tolerated procedure well.        Right Popliteal Block

## 2019-09-10 NOTE — Anesthesia Procedure Notes (Signed)
Anesthesia Regional Block: Adductor canal block   Pre-Anesthetic Checklist: ,, timeout performed, Correct Patient, Correct Site, Correct Laterality, Correct Procedure, Correct Position, site marked, Risks and benefits discussed,  Surgical consent,  Pre-op evaluation,  At surgeon's request and post-op pain management  Laterality: Right  Prep: chloraprep       Needles:  Injection technique: Single-shot  Needle Type: Echogenic Stimulator Needle     Needle Length: 9cm  Needle Gauge: 21   Needle insertion depth: 7 cm   Additional Needles:   Procedures:,,,, ultrasound used (permanent image in chart),,,,  Narrative:  Start time: 09/10/2019 11:03 AM End time: 09/10/2019 11:07 AM Injection made incrementally with aspirations every 5 mL.  Performed by: Personally  Anesthesiologist: Josephine Igo, MD  Additional Notes: Timeout performed. Patient sedated. Relevant anatomy ID'd using [place]. Incremental 2-28ml injection of LA with frequent aspiration. Patient tolerated procedure well.        Right Adductor Canal Block

## 2019-09-10 NOTE — Anesthesia Procedure Notes (Signed)
Procedure Name: LMA Insertion Date/Time: 09/10/2019 11:22 AM Performed by: Lieutenant Diego, CRNA Pre-anesthesia Checklist: Patient identified, Emergency Drugs available, Suction available and Patient being monitored Patient Re-evaluated:Patient Re-evaluated prior to induction Oxygen Delivery Method: Circle system utilized Preoxygenation: Pre-oxygenation with 100% oxygen Induction Type: IV induction Ventilation: Mask ventilation without difficulty LMA: LMA inserted LMA Size: 4.0 Number of attempts: 1 Placement Confirmation: positive ETCO2 and breath sounds checked- equal and bilateral Tube secured with: Tape Dental Injury: Teeth and Oropharynx as per pre-operative assessment

## 2019-09-10 NOTE — Anesthesia Preprocedure Evaluation (Signed)
Anesthesia Evaluation  Patient identified by MRN, date of birth, ID band Patient awake    Reviewed: Allergy & Precautions, NPO status , Patient's Chart, lab work & pertinent test results  Airway Mallampati: III  TM Distance: >3 FB     Dental  (+) Teeth Intact   Pulmonary neg pulmonary ROS,    Pulmonary exam normal breath sounds clear to auscultation       Cardiovascular hypertension, Pt. on medications Normal cardiovascular exam Rhythm:Regular Rate:Normal     Neuro/Psych  Headaches, Seizures -, Well Controlled,  No seizures since '80s negative psych ROS   GI/Hepatic negative GI ROS, Neg liver ROS,   Endo/Other  diabetes, Poorly Controlled, Type 2, Oral Hypoglycemic Agents, Insulin DependentHypothyroidism Hyperlipidemia  Renal/GU Renal InsufficiencyRenal diseaseHx/o renal calculi Hx/o pyelonephritis  negative genitourinary   Musculoskeletal  (+) Arthritis , Osteoarthritis,  Right midfoot Fx/ dislocation   Abdominal (+) + obese,   Peds  Hematology  (+) anemia ,   Anesthesia Other Findings   Reproductive/Obstetrics                             Anesthesia Physical Anesthesia Plan  ASA: III  Anesthesia Plan: General   Post-op Pain Management:  Regional for Post-op pain   Induction: Intravenous  PONV Risk Score and Plan: 4 or greater and Ondansetron and Treatment may vary due to age or medical condition  Airway Management Planned: LMA  Additional Equipment:   Intra-op Plan:   Post-operative Plan: Extubation in OR  Informed Consent: I have reviewed the patients History and Physical, chart, labs and discussed the procedure including the risks, benefits and alternatives for the proposed anesthesia with the patient or authorized representative who has indicated his/her understanding and acceptance.     Dental advisory given  Plan Discussed with: CRNA and Surgeon  Anesthesia Plan  Comments:         Anesthesia Quick Evaluation

## 2019-09-10 NOTE — Progress Notes (Signed)
Assisted Dr. Foster with right, ultrasound guided, popliteal, adductor canal block. Side rails up, monitors on throughout procedure. See vital signs in flow sheet. Tolerated Procedure well. 

## 2019-09-10 NOTE — Discharge Instructions (Signed)
DR[facility] POST-OP INSTRUCTIONS   Pain Management 1. The numbing medicine and your leg will last around 18 hours, take a dose of your pain medicine as soon as you feel it wearing off to avoid rebound pain. 2. Keep your foot elevated above heart level.  Make sure that your heel hangs free ('floats'). 3. Take all prescribed medication as directed. 4. If taking narcotic pain medication you may want to use an over-the-counter stool softener to avoid constipation. 5. You may take over-the-counter NSAIDs (ibuprofen, naproxen, etc.) as well as over-the-counter acetaminophen as directed on the packaging as a supplement for your pain and may also use it to wean away from the prescription medication.  Activity ? Non-weightbearing ? Keep splint intact  First Postoperative Visit 1. Your first postop visit will be at least 2 weeks after surgery.  This should be scheduled when you schedule surgery. 2. If you do not have a postoperative visit scheduled please call [phone] to schedule an appointment. 3. At the appointment your incision will be evaluated for suture removal, x-rays will be obtained if necessary.  General Instructions 1. Swelling is very common after foot and ankle surgery.  It often takes 3 months for the foot and ankle to begin to feel comfortable.  Some amount of swelling will persist for 6-12 months. 2. DO NOT change the dressing.  If there is a problem with the dressing (too tight, loose, gets wet, etc.) please contact Dr. Adair's office. 3. DO NOT get the dressing wet.  For showers you can use an over-the-counter cast cover or wrap a washcloth around the top of your dressing and then cover it with a plastic bag and tape it to your leg. 4. DO NOT soak the incision (no tubs, pools, bath, etc.) until you have approval from Dr. Adair.  Contact Dr. Adairs office or go to Emergency Room if: 1. Temperature above 101 F. 2. Increasing pain that is unresponsive to pain  medication or elevation 3. Excessive redness or swelling in your foot 4. Dressing problems - excessive bloody drainage, looseness or tightness, or if dressing gets wet 5. Develop pain, swelling, warmth, or discoloration of your calf   Post Anesthesia Home Care Instructions  Activity: Get plenty of rest for the remainder of the day. A responsible individual must stay with you for 24 hours following the procedure.  For the next 24 hours, DO NOT: -Drive a car -Operate machinery -Drink alcoholic beverages -Take any medication unless instructed by your physician -Make any legal decisions or sign important papers.  Meals: Start with liquid foods such as gelatin or soup. Progress to regular foods as tolerated. Avoid greasy, spicy, heavy foods. If nausea and/or vomiting occur, drink only clear liquids until the nausea and/or vomiting subsides. Call your physician if vomiting continues.  Special Instructions/Symptoms: Your throat may feel dry or sore from the anesthesia or the breathing tube placed in your throat during surgery. If this causes discomfort, gargle with warm salt water. The discomfort should disappear within 24 hours.  If you had a scopolamine patch placed behind your ear for the management of post- operative nausea and/or vomiting:  1. The medication in the patch is effective for 72 hours, after which it should be removed.  Wrap patch in a tissue and discard in the trash. Wash hands thoroughly with soap and water. 2. You may remove the patch earlier than 72 hours if you experience unpleasant side effects which may include dry mouth, dizziness   or visual disturbances. 3. Avoid touching the patch. Wash your hands with soap and water after contact with the patch.     Regional Anesthesia Blocks  1. Numbness or the inability to move the "blocked" extremity may last from 3-48 hours after placement. The length of time depends on the medication injected and your individual response to  the medication. If the numbness is not going away after 48 hours, call your surgeon.  2. The extremity that is blocked will need to be protected until the numbness is gone and the  Strength has returned. Because you cannot feel it, you will need to take extra care to avoid injury. Because it may be weak, you may have difficulty moving it or using it. You may not know what position it is in without looking at it while the block is in effect.  3. For blocks in the legs and feet, returning to weight bearing and walking needs to be done carefully. You will need to wait until the numbness is entirely gone and the strength has returned. You should be able to move your leg and foot normally before you try and bear weight or walk. You will need someone to be with you when you first try to ensure you do not fall and possibly risk injury.  4. Bruising and tenderness at the needle site are common side effects and will resolve in a few days.  5. Persistent numbness or new problems with movement should be communicated to the surgeon or the [facility] ([phone])/ [facility] ([phone]).  

## 2019-09-10 NOTE — Anesthesia Postprocedure Evaluation (Signed)
Anesthesia Post Note  Patient: Natalie [name]  Procedure(s) Performed: OPEN TREATMENT RIGHT FIRST, SECOND, THIRD TARSOMETATARSAL LISFRANC, MIDFOOT FUSION (Right Foot) OPEN REDUCTION INTERNAL FIXATION (ORIF) FOOT LISFRANC FRACTURE (Right Foot) ARTHRODESIS FOOT (Right Foot)     Patient location during evaluation: Phase II Anesthesia Type: General Level of consciousness: awake Pain management: pain level controlled Vital Signs Assessment: post-procedure vital signs reviewed and stable Respiratory status: spontaneous breathing Cardiovascular status: stable Postop Assessment: no apparent nausea or vomiting Anesthetic complications: no    Last Vitals:  Vitals:   09/10/19 1445 09/10/19 1500  BP: 136/60 (!) 118/56  Pulse: 88 85  Resp: 14 14  Temp:    SpO2: 96% 95%    Last Pain:  Vitals:   09/10/19 1500  TempSrc:   PainSc: 0-No pain   Pain Goal:        RLE Motor Response: No movement due to regional block (09/10/19 1445) RLE Sensation: No sensation (absent) (09/10/19 1445)        Huston Foley

## 2019-09-10 NOTE — Transfer of Care (Signed)
Immediate Anesthesia Transfer of Care Note  Patient: Natalie [name]  Procedure(s) Performed: OPEN TREATMENT RIGHT FIRST, SECOND, THIRD TARSOMETATARSAL LISFRANC, MIDFOOT FUSION (Right Foot) OPEN REDUCTION INTERNAL FIXATION (ORIF) FOOT LISFRANC FRACTURE (Right Foot) ARTHRODESIS FOOT (Right Foot)  Patient Location: PACU  Anesthesia Type:General and Regional  Level of Consciousness: awake  Airway & Oxygen Therapy: Patient Spontanous Breathing and Patient connected to face mask oxygen  Post-op Assessment: Report given to RN and Post -op Vital signs reviewed and stable  Post vital signs: Reviewed and stable  Last Vitals:  Vitals Value Taken Time  BP    Temp    Pulse 95 09/10/19 1414  Resp 14 09/10/19 1414  SpO2 99 % 09/10/19 1414  Vitals shown include unvalidated device data.  Last Pain:  Vitals:   09/10/19 1100  TempSrc:   PainSc: 0-No pain         Complications: No apparent anesthesia complications

## 2019-09-10 NOTE — H&P (Signed)
Natalie [name] is an 68 y.o. female.   Chief Complaint: Right midfoot fracture dislocation HPI: Patient is here for surgical correction of her subacute right midfoot fracture dislocation.  She sustained the injury in a fall and initially it was misdiagnosed.  She has been ambulating on it and she has noted progressive deformity and pain as well as swelling.  Pain is located in the right foot dorsally.  She denies any skin issues.  Patient does have a history of type 2 diabetes.  Denies a history of neuropathy.  She is instructed in proceeding with surgical correction.  Past Medical History:  Diagnosis Date  . Arthritis   . Atrophy of left kidney   . Cervicalgia   . Chronic kidney disease, stage 3    acute aki 03-14-2018 in setting pyelonehritis-- resolved  . Chronic migraine   . Chronic pain syndrome   . History of kidney stones   . History of pyelonephritis    hx recurrent  . History of recurrent UTIs   . History of sepsis    07/ 2017 secondary to pyelonephritis  . Hypertension   . Hypothyroidism   . Iron deficiency anemia   . Left ureteral stone   . Mixed hyperlipidemia   . Nephrolithiasis    right side nonobstructive per CT 03-14-2018  . Right shoulder pain    post sx 02-18-2018  . Seizure disorder ([place]) followed by pcp   per pt dx age 13--  no seizure since 1980's , controlled w/ phenobarbitol  . Type 2 diabetes mellitus ([place])    followed by pcp  . Wears glasses     Past Surgical History:  Procedure Laterality Date  . CESAREAN SECTION  1974  . CHOLECYSTECTOMY OPEN  2002  . CYSTO/  LEFT RETROGRADE PYELOGRAM  06-11-2006   dr Jeffie Pollock  [facility]  . CYSTOSCOPY WITH RETROGRADE PYELOGRAM, URETEROSCOPY AND STENT PLACEMENT Left 04/10/2018   Procedure: CYSTOSCOPY WITH RETROGRADE PYELOGRAM, URETEROSCOPY AND STENT PLACEMENT;  Surgeon: Franchot Gallo, MD;  Location: [facility];  Service: Urology;  Laterality: Left;  . IR URETERAL STENT PLACEMENT EXISTING ACCESS RIGHT   08-21-2007    dr Karsten Ro  [facility]  . LEFT RETROGRADE PYELOGRAM/ LEFT URETERAL STENT PLACEMENT  02-25-2001   dr Jeffie Pollock [facility]  . NEPHROLITHOTOMY Right 05/14/2016   Procedure: NEPHROLITHOTOMY PERCUTANEOUS;  Surgeon: Franchot Gallo, MD;  Location: [facility];  Service: Urology;  Laterality: Right;  . SHOULDER ARTHROSCOPY Bilateral left 11-13-2000;  right 02-18-2018    Family History  Problem Relation Age of Onset  . Heart disease Father   . Diabetes Father    Social History:  reports that she has never smoked. She has never used smokeless tobacco. She reports that she does not drink alcohol or use drugs.  Allergies:  Allergies  Allergen Reactions  . Compazine Other (See Comments)    Bad headache, vomiting; IV   . Cymbalta [Duloxetine Hcl] Other (See Comments)    "Kept me awake"  . Methadone Hives  . Escitalopram Anxiety  . Macrobid [Nitrofurantoin Monohyd Macro] Rash    Medications Prior to Admission  Medication Sig Dispense Refill  . amLODipine (NORVASC) 5 MG tablet Take 1 tablet (5 mg total) by mouth daily. (Patient taking differently: Take 5 mg by mouth daily with supper. ) 90 tablet 3  . atorvastatin (LIPITOR) 40 MG tablet Take 1 tablet (40 mg total) by mouth daily at 6 PM. 90 tablet 1  . butorphanol (STADOL) 10 MG/ML nasal spray PLACE 1  SPRAY INTO THE NOSE EVERY 4 HOURS AS NEEDED FOR MIGRAINE. (Patient taking differently: Place 1 spray into the nose every 4 (four) hours as needed for headache or migraine. PLACE 1 SPRAY INTO THE NOSE EVERY 4 HOURS AS NEEDED FOR MIGRAINE.) 2.5 mL 5  . glipiZIDE (GLUCOTROL) 10 MG tablet TAKE 2 TABLETS (20 MG TOTAL) BY MOUTH 2 (TWO) TIMES DAILY BEFORE A MEAL. (Patient taking differently: Take 10 mg by mouth daily before breakfast. ) 360 tablet 0  . insulin NPH Human (HUMULIN N,NOVOLIN N) 100 UNIT/ML injection Inject 0.05 mLs (5 Units total) into the skin 2 (two) times daily before a meal. (Patient taking differently: Inject 15 Units into the skin 2 (two) times  daily before a meal. ) 10 mL 0  . levothyroxine (SYNTHROID, LEVOTHROID) 75 MCG tablet Take 1 tablet (75 mcg total) by mouth daily. 90 tablet 1  . olmesartan (BENICAR) 40 MG tablet Take 1 tablet (40 mg total) by mouth daily. 90 tablet 0  . ondansetron (ZOFRAN-ODT) 8 MG disintegrating tablet Take 8 mg by mouth 3 (three) times daily as needed for nausea/vomiting.    Marland Kitchen PHENobarbital (LUMINAL) 97.2 MG tablet TAKE 2 TABLETS BY MOUTH ONCE DAILY ON MON,WED,AND FRI AND 1 TABLET DAILY ON OTHER DAYS (Patient taking differently: Take 97.2-194.4 mg by mouth See admin instructions. TAKE 2 TABLETS (194.4 MG TOTALLY) BY MOUTH ON MON, FRI, SUN; TAKE 1 TABLET (97.2 MG TOTALLY) DAILY ON OTHER DAYS) 135 tablet 1  . tiZANidine (ZANAFLEX) 4 MG tablet TAKE 1 TABLET BY MOUTH EVERY 4-6 HOURS AS NEEDED FOR MUSCLE SPASM 180 tablet 1  . zolpidem (AMBIEN) 10 MG tablet take 1 tablet by mouth at bedtime for sleep if needed. 90 tablet 1  . Insulin Syringe-Needle U-100 (INSULIN SYRINGE 1CC/31GX5/16") 31G X 5/16" 1 ML MISC 1 Units by Does not apply route as directed. Dx: E11.29 200 each 3    No results found for this or any previous visit (from the past 48 hour(s)). No results found.  Review of Systems  Constitutional: Negative.   HENT: Negative.   Eyes: Negative.   Respiratory: Negative.   Cardiovascular: Negative.   Gastrointestinal: Negative.   Endocrine: Negative.   Musculoskeletal:       Right foot pain  Neurological: Negative.   Hematological: Negative.   Psychiatric/Behavioral: Negative.     Blood pressure (!) 151/72, pulse 85, temperature 97.7 F (36.5 C), temperature source Tympanic, resp. rate 15, height 4\' 8"  (1.422 m), weight 72 kg, SpO2 99 %. Physical Exam  Vitals reviewed. Constitutional: She appears well-developed.  HENT:  Head: Normocephalic.  Eyes: Conjunctivae are normal.  Cardiovascular: Normal rate.  Respiratory: Effort normal.  GI: Soft.  Musculoskeletal:     Cervical back: Neck supple.      Comments: Right foot demonstrates some deformity throughout the midfoot.  There is some valgus at the first TMT joint.  There is swelling dorsally about the midfoot.  She has tenderness palpation along the dorsal midfoot notably at the first TMT, Lisfranc, second and third TMT joints.  No skin breakdown.  Sensation grossly intact to light touch in the dorsal and plantar foot with palpable dorsalis pedis pulse.  Neurological: She is alert.  Skin: Skin is warm.  Psychiatric: She has a normal mood and affect.     Assessment/Plan We will plan for open treatment of her right midfoot fracture dislocations.  She has a fracture of the first TMT base with medial cuneiform fracture and subluxation of the first  TMT joint.  She also has a fracture of the second metatarsal base with Lisfranc disruption.  Also fracture of the third metatarsal base and fourth metatarsal base.  Also a fracture of the lateral cuneiform.  We will plan for open treatment of these fractures and midfoot arthrodesis.  She understands the risk benefits alternatives of surgery which include but not limited to wound healing complications, infection, need for further surgery and damage to surrounding structures.  She also understands the possibility of perioperative and anesthetic risk which include death.  She understands the possibility of nonunion and malunion as well.  She would like to proceed with surgery.  She understands the postoperative weightbearing restrictions and would like to proceed.  Erle Crocker, MD 09/10/2019, 10:41 AM

## 2019-09-11 ENCOUNTER — Encounter: Payer: Self-pay | Admitting: *Deleted

## 2019-09-14 NOTE — Op Note (Signed)
Natalie [name] female 68 y.o. 09/10/2019  PreOperative Diagnosis: Right first metatarsal base fracture Right second metatarsal base fracture Right fourth metatarsal base fracture First TMT dislocation Second TMT dislocation Third TMT dislocation Fourth TMT dislocation Lisfranc joint dislocation Lateral cuneiform fracture Middle cuneiform fracture Cuboid fracture  PostOperative Diagnosis: Same  PROCEDURE: ORIF first metatarsal base fracture [id number] ORIF second metatarsal base fracture [id number] ORIF fourth metatarsal base fracture [id number] Open treatment of first TMT dislocation [id number] Open treatment of second TMT dislocation-28615 Open treatment of third TMT dislocation-28615 Open treatment of fourth TMT dislocation-28615 Open treatment of Lisfranc dislocation 28615 Open reduction internal fixation of lateral cuneiform fracture-28465 ORIF middle cuneiform fracture-28465 Midfoot arthrodesis, multiple-28730 Closed treatment of cuboid fracture-28450  SURGEON: Melony Overly, MD  ASSISTANT: None  ANESTHESIA: General with peripheral nerve block  FINDINGS: Persistent dislocated Lisfranc joint with second, third, fourth TMT subluxation/dislocation with metatarsal and cuneiform fractures as above Cuboid fracture  IMPLANTS: Arthrex midfoot arthrodesis plates and staples  [age] y.o. female sustained midfoot fracture dislocation approximately 2 months ago that was missed.  She was ambulating on and had persistent dislocation and deformity of her foot.  There is subluxation of the first TMT joint with instability and that the above fractures.  Given CT evidence of persistent dislocation and progressive deformity she was indicated for open treatment with midfoot arthrodesis.  Patient understood the risk benefits alternatives surgery which include but not limited to wound healing complications, infection, nonunion, malunion, need for further surgery.  She also understood  the risk of continued pain and disability.  She understood the perioperative and anesthetic risk which include death.  After weighing these risks he wished to proceed with surgery.  PROCEDURE: Patient was identified in the preoperative holding area.  The right foot was marked myself.  Consent was signed by myself and the patient.  Peripheral nerve block was performed by anesthesia.  She was taken the operative suite in the supine the operative table.  General LMA anesthesia was induced without difficulty.  Preoperative antibiotics were given.  Bump was placed under the right hip and a thigh tourniquet was placed as well.  Bone foam was used.  The right lower extremity was prepped and draped in the usual sterile fashion.  Surgical timeout was performed.  The right lower extremity was elevated and the tourniquet was inflated to 250 mmHg.  We began by making a longitudinal incision overlying the second metatarsal.  This was over the midfoot distal to the shaft of the second metatarsal.  This taken sharply down through skin and subcutaneous tissue.  Skin bleeders were Bovie cauterized.  The superficial branch of the superficial peroneal nerve was identified mobilized and protected through the entire to the case.  Then the fascia overlying the extensor hallucis brevis muscle belly was incised and the muscle was mobilized and retracted.  The tendon sheath was incised distally to allow for retraction.  The neurovascular bundle was then identified and a longitudinal incision down to bone was created medially and laterally to the neurovascular bundle and this was mobilized again and a vessel loop was placed to protected through the entirety of the case.  Then a subperiosteal dissection was made overlying the second TMT joint medially and laterally to gain exposure of the first TMT, intercuneiform, Lisfranc, second TMT and third TMT joints.  There was persistent dislocation of these joints with fracturing as noted above.   We turned our attention first to the first TMT joint.  There was  valgus through the first TMT with subluxation medially.  There was dislocation of the Lisfranc joint with large amount of interposed bony fragmentation and soft tissue.  Using a curette and a rondure the area of the Lisfranc joint was cleaned out.  Then the capsular tissue overlying the first TMT joint was incised and it was opened.  There is fracturing of the base of the first metatarsal and of the middle cuneiform fracture.  These fracture site's were mobilized.  There was significant of cartilage damage therefore using a curette the cartilage was removed from the base of the first metatarsal and the adjacent cartilage of the medial cuneiform.  Then the intercuneiform joint was assessed and found to be stable.  The second TMT joint was then identified and the capsular tissue was incised overlying this.  There was persistent dorsal and lateral subluxation of the second TMT base at the site of the middle cuneiform.  There is fracturing of the second metatarsal base.  The cartilage was fragmented and due to persistent subluxation had been worn through and therefore using a curette and rondure the cartilage surfaces of the base of the second metatarsal and opposed middle cuneiform cartilage structures were removed.  Then we turned our attention to the third TMT joint.  It was identified and found to be subluxated laterally.  The fracture at the base of the third metatarsal was noted.  There is fracturing of the lateral cuneiform bone.  The fragmentation did not land for simply fracture reduction and therefore the cartilage surfaces were removed from the base of the third metatarsal and the opposing cartilage surfaces of the lateral cuneiform.  We then assessed the fourth TMT joint which was mildly subluxated laterally.  All of the joint surfaces were then inspected for adequate cartilage removal and prepped for arthrodesis.  The joints were irrigated and  the remaining pieces were removed to allow for clean cancellous bony surfaces.  Then bone graft putty was placed within the joints prior to fixation.  Then we began reducing the joints and fractures.  We began with the first TMT joint.  The joint and fractures were reduced under direct visualization and held provisionally with K wire fixation.  We then used a Weber clamp to reduce the Lisfranc joint appropriately.  Intraoperative fluoroscopy was used to confirm adequate reduction.  We then placed a K wire for a fully threaded screw across the Lisfranc joint.  This was done under direct visualization.  This maintained good reduction of the Lisfranc joint.  Then we assessed reduction of the second TMT joint.  Using fracture reduction tool the fracture was reduced and held provisionally with a K wire fixation.  We then looked at the third TMT joint which was reduced upon reduction of the second TMT joint.  The fourth TMT joint was also reduced upon this reduction.  This was all acceptable.  We then turned our attention back to the first TMT joint.  A Arthrex first TMT compression plate was placed across the joint.  Fluoroscopy confirmed appropriate position of the plate and screws.  Direct axial compression through the first TMT joint and reduction of the joint was performed prior to plate placement and maintained after place placement.  There was good compression across the joint.  We then turned our attention to the second TMT joint.  The joint was reduced under direct visualization and compressed with a pointed reduction forcep.  Then a staple was placed across the joint providing compression and good  stability.  We then turned attention of third TMT joint.  Again under direct compression axially the joint was reduced and a staple was placed without difficulty.  This provided good compression and stability of the joint.  The fourth TMT joint was in an acceptable position and no hardware was placed.  We then assessed  the cuboid fluoroscopically and it was adequately reduced and therefore the decision for closed management of this was decided.  Intraoperative fluoroscopy is were again obtained to confirm appropriate screw length and placement of the hardware.  This was found to be acceptable.  Direct manipulation of the first, second and third TMT joints was performed and there found to be stable.  Then the wounds were irrigated copiously with normal saline.  The tourniquet was released and hemostasis was obtained.  Then the vessel loop was removed and the neurovascular bundle was inspected and found to be in continuity.  Then the deep tissue was closed with a 3-0 Monocryl stitch.  The subcuticular tissue was closed with a 3-0 mpnpcryl and the skin with a 3-0 nylon.  Xeroform was then placed on the wound.  A soft dressing was placed.  Then a nonweightbearing short leg splint was placed.  All counts were correct at the end the case.  There were no complications.  She tolerated it well.  She was awakened from anesthesia and taken recovery in stable condition.  POST OPERATIVE INSTRUCTIONS: Nonweightbearing to right lower extremity Keep splint dry and intact Call the office with concerns Take 1 325 mg aspirin daily for DVT prophylaxis given nonweightbearing status Patient will follow up in 2 weeks for splint removal, wound check and suture removal if appropriate.  Nonweightbearing x-rays of the right foot on arrival.  TOURNIQUET TIME: 2 hours and 10 minutes  BLOOD LOSS:  less than 50 mL         DRAINS: none         SPECIMEN: none       COMPLICATIONS:  * No complications entered in OR log *         Disposition: PACU - hemodynamically stable.         Condition: stable

## 2019-10-28 ENCOUNTER — Telehealth: Payer: Self-pay | Admitting: *Deleted

## 2019-10-28 NOTE — Telephone Encounter (Signed)
Mailbox full Schedule AWV

## 2019-11-21 ENCOUNTER — Ambulatory Visit: Payer: Medicare Other | Attending: Internal Medicine

## 2019-11-21 DIAGNOSIS — Z23 Encounter for immunization: Secondary | ICD-10-CM | POA: Insufficient documentation

## 2019-11-21 NOTE — Progress Notes (Signed)
   Covid-19 Vaccination Clinic  Name:  Natalie [name]    MRN: [id number] DOB: [birth year]  11/21/2019  Natalie [name] was observed post Covid-19 immunization for 15 minutes without incidence. She was provided with Vaccine Information Sheet and instruction to access the V-Safe system.   Natalie [name] was instructed to call 911 with any severe reactions post vaccine: Marland Kitchen Difficulty breathing  . Swelling of your face and throat  . A fast heartbeat  . A bad rash all over your body  . Dizziness and weakness    Immunizations Administered    Name Date Dose VIS Date Route   Pfizer COVID-19 Vaccine 11/21/2019  9:10 AM 0.3 mL 09/11/2019 Intramuscular   Manufacturer: [place]   Lot: [id number]   [place]: [id number]

## 2019-12-16 ENCOUNTER — Ambulatory Visit: Payer: Medicare Other | Attending: Internal Medicine

## 2019-12-16 DIAGNOSIS — Z23 Encounter for immunization: Secondary | ICD-10-CM

## 2019-12-16 NOTE — Progress Notes (Signed)
   Covid-19 Vaccination Clinic  Name:  Kaybree Williams    MRN: [id number] DOB: [birth year]  12/16/2019  Ms. Merkle was observed post Covid-19 immunization for 15 minutes without incident. She was provided with Vaccine Information Sheet and instruction to access the V-Safe system.   Ms. Wittner was instructed to call 911 with any severe reactions post vaccine: Marland Kitchen Difficulty breathing  . Swelling of face and throat  . A fast heartbeat  . A bad rash all over body  . Dizziness and weakness   Immunizations Administered    Name Date Dose VIS Date Route   Pfizer COVID-19 Vaccine 12/16/2019  4:39 PM 0.3 mL 09/11/2019 Intramuscular   Manufacturer: [place]   Lot: [id number]   [place]: [id number]

## 2020-05-24 ENCOUNTER — Ambulatory Visit: Payer: Self-pay

## 2020-05-24 ENCOUNTER — Ambulatory Visit
Admission: EM | Admit: 2020-05-24 | Discharge: 2020-05-24 | Disposition: A | Discharge status: Home / Self Care | Payer: Medicare Other | Attending: Emergency Medicine | Admitting: Emergency Medicine

## 2020-05-24 ENCOUNTER — Other Ambulatory Visit: Payer: Self-pay

## 2020-05-24 ENCOUNTER — Ambulatory Visit (INDEPENDENT_AMBULATORY_CARE_PROVIDER_SITE_OTHER): Payer: Medicare Other

## 2020-05-24 DIAGNOSIS — R0989 Other specified symptoms and signs involving the circulatory and respiratory systems: Secondary | ICD-10-CM

## 2020-05-24 DIAGNOSIS — R05 Cough: Secondary | ICD-10-CM

## 2020-05-24 DIAGNOSIS — R059 Cough, unspecified: Secondary | ICD-10-CM

## 2020-05-24 MED ORDER — ALBUTEROL SULFATE HFA 108 (90 BASE) MCG/ACT IN AERS: 2.0000 | INHALATION_SPRAY | RESPIRATORY_TRACT | 0 refills | Status: DC | PRN

## 2020-05-24 MED ORDER — PREDNISONE 50 MG PO TABS: 50.0000 mg | ORAL_TABLET | Freq: Every day | ORAL | 0 refills | Status: DC

## 2020-05-24 MED ORDER — FLUTICASONE PROPIONATE 50 MCG/ACT NA SUSP: 1.0000 | Freq: Every day | NASAL | 0 refills | Status: DC

## 2020-05-24 MED ORDER — BENZONATATE 100 MG PO CAPS: 100.0000 mg | ORAL_CAPSULE | Freq: Three times a day (TID) | ORAL | 0 refills | Status: DC

## 2020-05-24 MED ORDER — CETIRIZINE HCL 10 MG PO TABS: 10.0000 mg | ORAL_TABLET | Freq: Every day | ORAL | 0 refills | Status: DC

## 2020-05-24 NOTE — ED Provider Notes (Signed)
[name]    CSN: [id number] Arrival date & time: 05/24/20  1503      History   Chief Complaint Chief Complaint  Patient presents with  . Cough  . Nasal Congestion    HPI Natalie [name] is a 69 y.o. female presenting for cough and congestion since last Thursday.  States that her husband and daughter have been ill as well: Had negative Covid testing.  Patient has not been Covid tested to this point.  Denies shortness of breath, chest pain or palpitations, fever.  Has taken OTC medications without relief.  No lower leg swelling, nausea, vomiting, diarrhea.    Past Medical History:  Diagnosis Date  . Arthritis   . Atrophy of left kidney   . Cervicalgia   . Chronic kidney disease, stage 3    acute aki 03-14-2018 in setting pyelonehritis-- resolved  . Chronic migraine   . Chronic pain syndrome   . History of kidney stones   . History of pyelonephritis    hx recurrent  . History of recurrent UTIs   . History of sepsis    07/ 2017 secondary to pyelonephritis  . Hypertension   . Hypothyroidism   . Iron deficiency anemia   . Left ureteral stone   . Mixed hyperlipidemia   . Nephrolithiasis    right side nonobstructive per CT 03-14-2018  . Right shoulder pain    post sx 02-18-2018  . Seizure disorder ([place]) followed by pcp   per pt dx age 89--  no seizure since 1980's , controlled w/ phenobarbitol  . Type 2 diabetes mellitus ([place])    followed by pcp  . Wears glasses     Patient Active Problem List   Diagnosis Date Noted  . Chronic pain syndrome 03/16/2018  . Encounter for long-term (current) use of high-risk medication 03/16/2018  . Complicated UTI (urinary tract infection) 03/14/2018  . Acute renal failure superimposed on stage 3 chronic kidney disease ([place]) 03/14/2018  . Hypotension 03/14/2018  . Generalized weakness   . Chronic kidney disease 09/06/2016  . Anemia, iron deficiency 06/23/2016  . Vitamin B12 deficiency (dietary) anemia 06/23/2016  .  Migraine, chronic, without aura, intractable, with status migrainosus 01/04/2016  . Abdominal pain   . Pyelonephritis 08/31/2014  . 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 Renal stone 05/14/2013  . BMI 33.0-33.9,adult 03/30/2013  . Recurrent UTI 09/17/2012  . Insulin dependent diabetes with renal manifestation 03/19/2012  . HTN (hypertension) 03/19/2012  . Hyperlipemia 03/19/2012  . Hypothyroidism 03/19/2012  . Seizure disorder ([place]) 03/19/2012  . Insomnia 03/19/2012  . Migraine 03/19/2012    Past Surgical History:  Procedure Laterality Date  . CESAREAN SECTION  1974  . CHOLECYSTECTOMY OPEN  2002  . CYSTO/  LEFT RETROGRADE PYELOGRAM  06-11-2006   dr Jeffie Pollock  [facility]  . CYSTOSCOPY WITH RETROGRADE PYELOGRAM, URETEROSCOPY AND STENT PLACEMENT Left 04/10/2018   Procedure: CYSTOSCOPY WITH RETROGRADE PYELOGRAM, URETEROSCOPY AND STENT PLACEMENT;  Surgeon: Franchot Gallo, MD;  Location: [facility];  Service: Urology;  Laterality: Left;  . FOOT ARTHRODESIS Right 09/10/2019   Procedure: ARTHRODESIS FOOT;  Surgeon: Erle Crocker, MD;  Location: [place];  Service: Orthopedics;  Laterality: Right;  . IR URETERAL STENT PLACEMENT EXISTING ACCESS RIGHT  08-21-2007    dr Karsten Ro  [facility]  . LEFT RETROGRADE PYELOGRAM/ LEFT URETERAL STENT PLACEMENT  02-25-2001   dr Jeffie Pollock [facility]  . NEPHROLITHOTOMY Right 05/14/2016   Procedure: NEPHROLITHOTOMY  PERCUTANEOUS;  Surgeon: Franchot Gallo, MD;  Location: [facility];  Service: Urology;  Laterality: Right;  . OPEN REDUCTION INTERNAL FIXATION (ORIF) FOOT LISFRANC FRACTURE Right 09/10/2019   Procedure: OPEN REDUCTION INTERNAL FIXATION (ORIF) FOOT LISFRANC FRACTURE;  Surgeon: Erle Crocker, MD;  Location: [place];  Service: Orthopedics;  Laterality: Right;  . REPAIR EXTENSOR TENDON WITH METATARSAL OSTEOTOMY AND OPEN REDUCTION IN Right 09/10/2019   Procedure: OPEN  TREATMENT RIGHT FIRST, SECOND, THIRD TARSOMETATARSAL LISFRANC, MIDFOOT FUSION;  Surgeon: Erle Crocker, MD;  Location: [place];  Service: Orthopedics;  Laterality: Right;  . SHOULDER ARTHROSCOPY Bilateral left 11-13-2000;  right 02-18-2018    OB History   No obstetric history on file.      Home Medications    Prior to Admission medications   Medication Sig Start Date End Date Taking? Authorizing Provider  albuterol (VENTOLIN HFA) 108 (90 Base) MCG/ACT inhaler Inhale 2 puffs into the lungs every 4 (four) hours as needed for wheezing or shortness of breath. 05/24/20   Hall-Potvin, [place], PA-C  amLODipine (NORVASC) 5 MG tablet Take 1 tablet (5 mg total) by mouth daily. Patient taking differently: Take 5 mg by mouth daily with supper.  02/08/18   Shawnee Knapp, MD  atorvastatin (LIPITOR) 40 MG tablet Take 1 tablet (40 mg total) by mouth daily at 6 PM. 08/07/18   Shawnee Knapp, MD  benzonatate (TESSALON) 100 MG capsule Take 1 capsule (100 mg total) by mouth every 8 (eight) hours. 05/24/20   Hall-Potvin, [place], PA-C  butorphanol (STADOL) 10 MG/ML nasal spray PLACE 1 SPRAY INTO THE NOSE EVERY 4 HOURS AS NEEDED FOR MIGRAINE. Patient taking differently: Place 1 spray into the nose every 4 (four) hours as needed for headache or migraine. PLACE 1 SPRAY INTO THE NOSE EVERY 4 HOURS AS NEEDED FOR MIGRAINE. 10/08/18   Shawnee Knapp, MD  cetirizine (ZYRTEC ALLERGY) 10 MG tablet Take 1 tablet (10 mg total) by mouth daily. 05/24/20   Hall-Potvin, [place], PA-C  fluticasone (FLONASE) 50 MCG/ACT nasal spray Place 1 spray into both nostrils daily. 05/24/20   Hall-Potvin, [place], PA-C  glipiZIDE (GLUCOTROL) 10 MG tablet TAKE 2 TABLETS (20 MG TOTAL) BY MOUTH 2 (TWO) TIMES DAILY BEFORE A MEAL. Patient taking differently: Take 10 mg by mouth daily before breakfast.  11/17/18   Shawnee Knapp, MD  insulin NPH Human (HUMULIN N,NOVOLIN N) 100 UNIT/ML injection Inject 0.05 mLs (5 Units total) into the  skin 2 (two) times daily before a meal. Patient taking differently: Inject 15 Units into the skin 2 (two) times daily before a meal.  10/17/18   Rutherford Guys, MD  Insulin Syringe-Needle U-100 (INSULIN SYRINGE 1CC/31GX5/16") 31G X 5/16" 1 ML MISC 1 Units by Does not apply route as directed. Dx: E11.29 11/10/18   Shawnee Knapp, MD  levothyroxine (SYNTHROID, LEVOTHROID) 75 MCG tablet Take 1 tablet (75 mcg total) by mouth daily. 08/07/18   Shawnee Knapp, MD  olmesartan (BENICAR) 40 MG tablet Take 1 tablet (40 mg total) by mouth daily. 11/03/18   Shawnee Knapp, MD  PHENobarbital (LUMINAL) 97.2 MG tablet TAKE 2 TABLETS BY MOUTH ONCE DAILY ON MON,WED,AND FRI AND 1 TABLET DAILY ON OTHER DAYS Patient taking differently: Take 97.2-194.4 mg by mouth See admin instructions. TAKE 2 TABLETS (194.4 MG TOTALLY) BY MOUTH ON MON, FRI, SUN; TAKE 1 TABLET (97.2 MG TOTALLY) DAILY ON OTHER DAYS 08/07/18   Shawnee Knapp, MD  predniSONE Donley Redder)  50 MG tablet Take 1 tablet (50 mg total) by mouth daily with breakfast. 05/24/20   Hall-Potvin, [place], PA-C  tiZANidine (ZANAFLEX) 4 MG tablet TAKE 1 TABLET BY MOUTH EVERY 4-6 HOURS AS NEEDED FOR MUSCLE SPASM 01/08/19   Delia Chimes A, MD  zolpidem (AMBIEN) 10 MG tablet take 1 tablet by mouth at bedtime for sleep if needed. 05/08/18   Shawnee Knapp, MD    Family History Family History  Problem Relation Age of Onset  . Heart disease Father   . Diabetes Father     Social History Social History   Tobacco Use  . Smoking status: Never Smoker  . Smokeless tobacco: Never Used  Vaping Use  . Vaping Use: Never used  Substance Use Topics  . Alcohol use: No    Alcohol/week: 0.0 standard drinks  . Drug use: No     Allergies   Compazine, Cymbalta [duloxetine hcl], Methadone, Escitalopram, and Macrobid [nitrofurantoin monohyd macro]   Review of Systems As per HPI   Physical Exam Triage Vital Signs ED Triage Vitals [05/24/20 1523]  Enc Vitals Group     BP (!) 162/76     Pulse  Rate 82     Resp 16     Temp 98.2 F (36.8 C)     Temp src      SpO2 95 %     Weight      Height      Head Circumference      Peak Flow      Pain Score      Pain Loc      Pain Edu?      Excl. in [place]?    No data found.  Updated Vital Signs BP (!) 162/76   Pulse 82   Temp 98.2 F (36.8 C)   Resp 16   SpO2 95%   Visual Acuity Right Eye Distance:   Left Eye Distance:   Bilateral Distance:    Right Eye Near:   Left Eye Near:    Bilateral Near:     Physical Exam Constitutional:      General: She is not in acute distress.    Appearance: She is obese. She is not ill-appearing or diaphoretic.  HENT:     Head: Normocephalic and atraumatic.     Mouth/Throat:     Mouth: Mucous membranes are moist.     Pharynx: Oropharynx is clear. No oropharyngeal exudate or posterior oropharyngeal erythema.  Eyes:     General: No scleral icterus.    Conjunctiva/sclera: Conjunctivae normal.     Pupils: Pupils are equal, round, and reactive to light.  Neck:     Comments: Trachea midline, negative JVD Cardiovascular:     Rate and Rhythm: Normal rate and regular rhythm.     Heart sounds: No murmur heard.  No gallop.   Pulmonary:     Effort: Pulmonary effort is normal. No respiratory distress.     Breath sounds: No wheezing, rhonchi or rales.  Musculoskeletal:     Cervical back: Neck supple. No tenderness.  Lymphadenopathy:     Cervical: No cervical adenopathy.  Skin:    Capillary Refill: Capillary refill takes less than 2 seconds.     Coloration: Skin is not jaundiced or pale.     Findings: No rash.  Neurological:     General: No focal deficit present.     Mental Status: She is alert and oriented to person, place, and time.      UC  Treatments / Results  Labs (all labs ordered are listed, but only abnormal results are displayed) Labs Reviewed  NOVEL CORONAVIRUS, NAA    EKG   Radiology DG Chest 2 View  Result Date: 05/24/2020 CLINICAL DATA:  Cough and congestion for  the past 5 days. EXAM: CHEST - 2 VIEW COMPARISON:  Chest x-ray dated December 01, 2018. FINDINGS: The heart size and mediastinal contours are within normal limits. Normal pulmonary vascularity. No focal consolidation, pleural effusion, or pneumothorax. Unchanged mild elevation of the right hemidiaphragm. Moderate hiatal hernia. No acute osseous abnormality. IMPRESSION: 1. No acute cardiopulmonary disease. 2. Moderate hiatal hernia. Electronically Signed   By: Titus Dubin M.D.   On: 05/24/2020 16:22    Procedures Procedures (including critical care time)  Medications Ordered in UC Medications - No data to display  Initial Impression / Assessment and Plan / UC Course  I have reviewed the triage vital signs and the nursing notes.  Pertinent labs & imaging results that were available during my care of the patient were reviewed by me and considered in my medical decision making (see chart for details).     Patient afebrile, nontoxic, with SpO2 95%.  Chest x-ray negative.  Covid PCR pending.  Patient to quarantine until results are back.  We will treat supportively as outlined below.  Return precautions discussed, patient verbalized understanding and is agreeable to plan. Final Clinical Impressions(s) / UC Diagnoses   Final diagnoses:  Cough     Discharge Instructions     Tessalon for cough. Start flonase, atrovent nasal spray for nasal congestion/drainage. You can use over the counter nasal saline rinse such as neti pot for nasal congestion. Keep hydrated, your urine should be clear to pale yellow in color. Tylenol/motrin for fever and pain. Monitor for any worsening of symptoms, chest pain, shortness of breath, wheezing, swelling of the throat, go to the emergency department for further evaluation needed.     ED Prescriptions    Medication Sig Dispense Auth. Provider   benzonatate (TESSALON) 100 MG capsule Take 1 capsule (100 mg total) by mouth every 8 (eight) hours. 21 capsule  Hall-Potvin, [place], PA-C   predniSONE (DELTASONE) 50 MG tablet Take 1 tablet (50 mg total) by mouth daily with breakfast. 5 tablet Hall-Potvin, [place], PA-C   cetirizine (ZYRTEC ALLERGY) 10 MG tablet Take 1 tablet (10 mg total) by mouth daily. 30 tablet Hall-Potvin, [place], PA-C   fluticasone (FLONASE) 50 MCG/ACT nasal spray Place 1 spray into both nostrils daily. 16 g Hall-Potvin, [place], PA-C   albuterol (VENTOLIN HFA) 108 (90 Base) MCG/ACT inhaler Inhale 2 puffs into the lungs every 4 (four) hours as needed for wheezing or shortness of breath. 18 g Hall-Potvin, [place], PA-C     PDMP not reviewed this encounter.   Hall-Potvin, [place], [place] 05/24/20 [phone]

## 2020-05-24 NOTE — Discharge Instructions (Addendum)

## 2020-05-24 NOTE — ED Triage Notes (Signed)
Cough and congestion since last Thursday, states symptoms have been going around her family. Declined COVID testing at intake.

## 2020-05-25 LAB — SARS-COV-2, NAA 2 DAY TAT

## 2020-05-25 LAB — NOVEL CORONAVIRUS, NAA: SARS-CoV-2, NAA: NOT DETECTED

## 2020-07-30 ENCOUNTER — Ambulatory Visit: Payer: Medicare Other | Attending: Internal Medicine

## 2020-07-30 DIAGNOSIS — Z23 Encounter for immunization: Secondary | ICD-10-CM

## 2020-07-30 NOTE — Progress Notes (Signed)
   Covid-19 Vaccination Clinic  Name:  Natalie [name]    MRN: [id number] DOB: [birth year]  07/30/2020  [name] was observed post Covid-19 immunization for 15 minutes without incident. She was provided with Vaccine Information Sheet and instruction to access the V-Safe system.   [name] was instructed to call 911 with any severe reactions post vaccine: Marland Kitchen Difficulty breathing  . Swelling of face and throat  . A fast heartbeat  . A bad rash Natalie [name] over body  . Dizziness and weakness

## 2020-08-31 DIAGNOSIS — I639 Cerebral infarction, unspecified: Secondary | ICD-10-CM

## 2020-08-31 HISTORY — DX: Cerebral infarction, unspecified: I63.9

## 2020-09-02 ENCOUNTER — Encounter (HOSPITAL_COMMUNITY): Payer: Self-pay

## 2020-09-02 ENCOUNTER — Other Ambulatory Visit (HOSPITAL_COMMUNITY): Payer: Medicare Other

## 2020-09-02 ENCOUNTER — Inpatient Hospital Stay (HOSPITAL_COMMUNITY)
Admission: EM | Admit: 2020-09-02 | Discharge: 2020-09-06 | DRG: 064 | Disposition: A | Discharge status: Rehabilitation Facility | Payer: Medicare Other | Attending: Neurology | Admitting: Neurology

## 2020-09-02 ENCOUNTER — Inpatient Hospital Stay (HOSPITAL_COMMUNITY): Payer: Medicare Other

## 2020-09-02 ENCOUNTER — Other Ambulatory Visit: Payer: Self-pay

## 2020-09-02 ENCOUNTER — Emergency Department (HOSPITAL_COMMUNITY): Payer: Medicare Other

## 2020-09-02 DIAGNOSIS — N1831 Chronic kidney disease, stage 3a: Secondary | ICD-10-CM | POA: Diagnosis present

## 2020-09-02 DIAGNOSIS — E1142 Type 2 diabetes mellitus with diabetic polyneuropathy: Secondary | ICD-10-CM | POA: Diagnosis present

## 2020-09-02 DIAGNOSIS — S52532A Colles' fracture of left radius, initial encounter for closed fracture: Secondary | ICD-10-CM | POA: Diagnosis present

## 2020-09-02 DIAGNOSIS — Z9049 Acquired absence of other specified parts of digestive tract: Secondary | ICD-10-CM | POA: Diagnosis not present

## 2020-09-02 DIAGNOSIS — Z87442 Personal history of urinary calculi: Secondary | ICD-10-CM | POA: Diagnosis not present

## 2020-09-02 DIAGNOSIS — Z981 Arthrodesis status: Secondary | ICD-10-CM | POA: Diagnosis not present

## 2020-09-02 DIAGNOSIS — I161 Hypertensive emergency: Secondary | ICD-10-CM | POA: Diagnosis present

## 2020-09-02 DIAGNOSIS — Z79899 Other long term (current) drug therapy: Secondary | ICD-10-CM

## 2020-09-02 DIAGNOSIS — Z794 Long term (current) use of insulin: Secondary | ICD-10-CM | POA: Diagnosis not present

## 2020-09-02 DIAGNOSIS — W19XXXA Unspecified fall, initial encounter: Secondary | ICD-10-CM | POA: Diagnosis present

## 2020-09-02 DIAGNOSIS — E1122 Type 2 diabetes mellitus with diabetic chronic kidney disease: Secondary | ICD-10-CM | POA: Diagnosis present

## 2020-09-02 DIAGNOSIS — R471 Dysarthria and anarthria: Secondary | ICD-10-CM | POA: Diagnosis present

## 2020-09-02 DIAGNOSIS — I129 Hypertensive chronic kidney disease with stage 1 through stage 4 chronic kidney disease, or unspecified chronic kidney disease: Secondary | ICD-10-CM | POA: Diagnosis present

## 2020-09-02 DIAGNOSIS — D62 Acute posthemorrhagic anemia: Secondary | ICD-10-CM | POA: Diagnosis present

## 2020-09-02 DIAGNOSIS — Z20822 Contact with and (suspected) exposure to covid-19: Secondary | ICD-10-CM | POA: Diagnosis present

## 2020-09-02 DIAGNOSIS — G40909 Epilepsy, unspecified, not intractable, without status epilepticus: Secondary | ICD-10-CM | POA: Diagnosis present

## 2020-09-02 DIAGNOSIS — E782 Mixed hyperlipidemia: Secondary | ICD-10-CM | POA: Diagnosis present

## 2020-09-02 DIAGNOSIS — Z888 Allergy status to other drugs, medicaments and biological substances status: Secondary | ICD-10-CM

## 2020-09-02 DIAGNOSIS — I10 Essential (primary) hypertension: Secondary | ICD-10-CM | POA: Diagnosis present

## 2020-09-02 DIAGNOSIS — Z87440 Personal history of urinary (tract) infections: Secondary | ICD-10-CM

## 2020-09-02 DIAGNOSIS — I619 Nontraumatic intracerebral hemorrhage, unspecified: Secondary | ICD-10-CM | POA: Diagnosis present

## 2020-09-02 DIAGNOSIS — I69154 Hemiplegia and hemiparesis following nontraumatic intracerebral hemorrhage affecting left non-dominant side: Secondary | ICD-10-CM | POA: Diagnosis present

## 2020-09-02 DIAGNOSIS — N179 Acute kidney failure, unspecified: Secondary | ICD-10-CM | POA: Diagnosis present

## 2020-09-02 DIAGNOSIS — R4781 Slurred speech: Secondary | ICD-10-CM | POA: Diagnosis present

## 2020-09-02 DIAGNOSIS — Z7984 Long term (current) use of oral hypoglycemic drugs: Secondary | ICD-10-CM | POA: Diagnosis not present

## 2020-09-02 DIAGNOSIS — Z9181 History of falling: Secondary | ICD-10-CM | POA: Diagnosis not present

## 2020-09-02 DIAGNOSIS — M1990 Unspecified osteoarthritis, unspecified site: Secondary | ICD-10-CM | POA: Diagnosis present

## 2020-09-02 DIAGNOSIS — G894 Chronic pain syndrome: Secondary | ICD-10-CM | POA: Diagnosis present

## 2020-09-02 DIAGNOSIS — Z833 Family history of diabetes mellitus: Secondary | ICD-10-CM | POA: Diagnosis not present

## 2020-09-02 DIAGNOSIS — D509 Iron deficiency anemia, unspecified: Secondary | ICD-10-CM | POA: Diagnosis present

## 2020-09-02 DIAGNOSIS — I610 Nontraumatic intracerebral hemorrhage in hemisphere, subcortical: Secondary | ICD-10-CM | POA: Diagnosis present

## 2020-09-02 DIAGNOSIS — E039 Hypothyroidism, unspecified: Secondary | ICD-10-CM | POA: Diagnosis present

## 2020-09-02 DIAGNOSIS — Z8249 Family history of ischemic heart disease and other diseases of the circulatory system: Secondary | ICD-10-CM

## 2020-09-02 DIAGNOSIS — Z79890 Hormone replacement therapy (postmenopausal): Secondary | ICD-10-CM

## 2020-09-02 DIAGNOSIS — G43909 Migraine, unspecified, not intractable, without status migrainosus: Secondary | ICD-10-CM | POA: Diagnosis present

## 2020-09-02 DIAGNOSIS — G936 Cerebral edema: Secondary | ICD-10-CM | POA: Diagnosis present

## 2020-09-02 DIAGNOSIS — E785 Hyperlipidemia, unspecified: Secondary | ICD-10-CM | POA: Diagnosis present

## 2020-09-02 DIAGNOSIS — I6389 Other cerebral infarction: Secondary | ICD-10-CM | POA: Diagnosis not present

## 2020-09-02 DIAGNOSIS — R29810 Facial weakness: Secondary | ICD-10-CM | POA: Diagnosis not present

## 2020-09-02 DIAGNOSIS — I615 Nontraumatic intracerebral hemorrhage, intraventricular: Secondary | ICD-10-CM | POA: Diagnosis not present

## 2020-09-02 DIAGNOSIS — Z885 Allergy status to narcotic agent status: Secondary | ICD-10-CM | POA: Diagnosis not present

## 2020-09-02 DIAGNOSIS — R531 Weakness: Secondary | ICD-10-CM | POA: Diagnosis present

## 2020-09-02 DIAGNOSIS — E119 Type 2 diabetes mellitus without complications: Secondary | ICD-10-CM

## 2020-09-02 DIAGNOSIS — N183 Chronic kidney disease, stage 3 unspecified: Secondary | ICD-10-CM | POA: Diagnosis present

## 2020-09-02 LAB — CBC
HCT: 37 % (ref 36.0–46.0)
Hemoglobin: 11.4 g/dL — ABNORMAL LOW (ref 12.0–15.0)
MCH: 29.1 pg (ref 26.0–34.0)
MCHC: 30.8 g/dL (ref 30.0–36.0)
MCV: 94.4 fL (ref 80.0–100.0)
Platelets: 267 10*3/uL (ref 150–400)
RBC: 3.92 MIL/uL (ref 3.87–5.11)
RDW: 14.6 % (ref 11.5–15.5)
WBC: 8.4 10*3/uL (ref 4.0–10.5)
nRBC: 0 % (ref 0.0–0.2)

## 2020-09-02 LAB — COMPREHENSIVE METABOLIC PANEL
ALT: 16 U/L (ref 0–44)
AST: 21 U/L (ref 15–41)
Albumin: 3.8 g/dL (ref 3.5–5.0)
Alkaline Phosphatase: 88 U/L (ref 38–126)
Anion gap: 10 (ref 5–15)
BUN: 15 mg/dL (ref 8–23)
CO2: 26 mmol/L (ref 22–32)
Calcium: 9.7 mg/dL (ref 8.9–10.3)
Chloride: 104 mmol/L (ref 98–111)
Creatinine, Ser: 1.11 mg/dL — ABNORMAL HIGH (ref 0.44–1.00)
GFR, Estimated: 53 mL/min — ABNORMAL LOW (ref >60)
Glucose, Bld: 178 mg/dL — ABNORMAL HIGH (ref 70–99)
Potassium: 3.5 mmol/L (ref 3.5–5.1)
Sodium: 140 mmol/L (ref 135–145)
Total Bilirubin: 0.5 mg/dL (ref 0.3–1.2)
Total Protein: 6.9 g/dL (ref 6.5–8.1)

## 2020-09-02 LAB — LIPID PANEL
Cholesterol: 154 mg/dL (ref 0–200)
HDL: 64 mg/dL (ref >40)
LDL Cholesterol: 69 mg/dL (ref 0–99)
Total CHOL/HDL Ratio: 2.4 RATIO
Triglycerides: 107 mg/dL (ref <150)
VLDL: 21 mg/dL (ref 0–40)

## 2020-09-02 LAB — DIFFERENTIAL
Abs Immature Granulocytes: 0.03 10*3/uL (ref 0.00–0.07)
Basophils Absolute: 0 10*3/uL (ref 0.0–0.1)
Basophils Relative: 0 %
Eosinophils Absolute: 0.1 10*3/uL (ref 0.0–0.5)
Eosinophils Relative: 1 %
Immature Granulocytes: 0 %
Lymphocytes Relative: 13 %
Lymphs Abs: 1.1 10*3/uL (ref 0.7–4.0)
Monocytes Absolute: 0.9 10*3/uL (ref 0.1–1.0)
Monocytes Relative: 11 %
Neutro Abs: 6.2 10*3/uL (ref 1.7–7.7)
Neutrophils Relative %: 75 %

## 2020-09-02 LAB — I-STAT CHEM 8, ED
BUN: 19 mg/dL (ref 8–23)
Calcium, Ion: 1.19 mmol/L (ref 1.15–1.40)
Chloride: 108 mmol/L (ref 98–111)
Creatinine, Ser: 1 mg/dL (ref 0.44–1.00)
Glucose, Bld: 175 mg/dL — ABNORMAL HIGH (ref 70–99)
HCT: 36 % (ref 36.0–46.0)
Hemoglobin: 12.2 g/dL (ref 12.0–15.0)
Potassium: 3.5 mmol/L (ref 3.5–5.1)
Sodium: 141 mmol/L (ref 135–145)
TCO2: 26 mmol/L (ref 22–32)

## 2020-09-02 LAB — RESP PANEL BY RT-PCR (FLU A&B, COVID) ARPGX2
Influenza A by PCR: NEGATIVE
Influenza B by PCR: NEGATIVE
SARS Coronavirus 2 by RT PCR: NEGATIVE

## 2020-09-02 LAB — ETHANOL: Alcohol, Ethyl (B): <10 mg/dL (ref <10)

## 2020-09-02 LAB — PHENOBARBITAL LEVEL: Phenobarbital: 27.1 ug/mL (ref 15.0–30.0)

## 2020-09-02 LAB — MRSA PCR SCREENING: MRSA by PCR: NEGATIVE

## 2020-09-02 LAB — PROTIME-INR
INR: 1 (ref 0.8–1.2)
Prothrombin Time: 12.9 seconds (ref 11.4–15.2)

## 2020-09-02 LAB — HEMOGLOBIN A1C
Hgb A1c MFr Bld: 8.1 % — ABNORMAL HIGH (ref 4.8–5.6)
Mean Plasma Glucose: 185.77 mg/dL

## 2020-09-02 LAB — GLUCOSE, CAPILLARY
Glucose-Capillary: 146 mg/dL — ABNORMAL HIGH (ref 70–99)
Glucose-Capillary: 266 mg/dL — ABNORMAL HIGH (ref 70–99)

## 2020-09-02 LAB — APTT: aPTT: 24 seconds (ref 24–36)

## 2020-09-02 MED ORDER — CLEVIDIPINE BUTYRATE 0.5 MG/ML IV EMUL
0.0000 mg/h | INTRAVENOUS | Status: DC
  Filled 2020-09-02: qty 50

## 2020-09-02 MED ORDER — CLEVIDIPINE BUTYRATE 0.5 MG/ML IV EMUL
0.0000 mg/h | INTRAVENOUS | Status: DC
  Administered 2020-09-02: 2 mg/h via INTRAVENOUS
  Administered 2020-09-02: 10 mg/h via INTRAVENOUS
  Administered 2020-09-02: 4 mg/h via INTRAVENOUS
  Filled 2020-09-02 (×4): qty 50

## 2020-09-02 MED ORDER — LEVOTHYROXINE SODIUM 75 MCG PO TABS
75.0000 ug | ORAL_TABLET | Freq: Every day | ORAL | Status: DC
  Administered 2020-09-03 – 2020-09-06 (×4): 75 ug via ORAL
  Filled 2020-09-02 (×3): qty 1

## 2020-09-02 MED ORDER — ACETAMINOPHEN 325 MG PO TABS
650.0000 mg | ORAL_TABLET | ORAL | Status: DC | PRN
  Administered 2020-09-02: 650 mg via ORAL
  Filled 2020-09-02 (×3): qty 2

## 2020-09-02 MED ORDER — ALBUTEROL SULFATE HFA 108 (90 BASE) MCG/ACT IN AERS: 2.0000 | INHALATION_SPRAY | RESPIRATORY_TRACT | Status: DC | PRN

## 2020-09-02 MED ORDER — PHENOBARBITAL 32.4 MG PO TABS
194.4000 mg | ORAL_TABLET | ORAL | Status: DC
  Administered 2020-09-02 – 2020-09-05 (×3): 194.4 mg via ORAL
  Filled 2020-09-02 (×3): qty 6

## 2020-09-02 MED ORDER — INSULIN ASPART 100 UNIT/ML ~~LOC~~ SOLN
0.0000 [IU] | Freq: Three times a day (TID) | SUBCUTANEOUS | Status: DC
  Administered 2020-09-02: 1 [IU] via SUBCUTANEOUS
  Administered 2020-09-03: 2 [IU] via SUBCUTANEOUS
  Administered 2020-09-03 (×2): 3 [IU] via SUBCUTANEOUS
  Administered 2020-09-04: 7 [IU] via SUBCUTANEOUS
  Administered 2020-09-04 (×2): 2 [IU] via SUBCUTANEOUS
  Administered 2020-09-05: 3 [IU] via SUBCUTANEOUS
  Administered 2020-09-05: 2 [IU] via SUBCUTANEOUS
  Administered 2020-09-05: 7 [IU] via SUBCUTANEOUS
  Administered 2020-09-06: 2 [IU] via SUBCUTANEOUS
  Administered 2020-09-06: 3 [IU] via SUBCUTANEOUS

## 2020-09-02 MED ORDER — ACETAMINOPHEN 650 MG RE SUPP: 650.0000 mg | RECTAL | Status: DC | PRN

## 2020-09-02 MED ORDER — LABETALOL HCL 5 MG/ML IV SOLN
20.0000 mg | Freq: Once | INTRAVENOUS | Status: AC
  Administered 2020-09-02: 10 mg via INTRAVENOUS

## 2020-09-02 MED ORDER — BUTORPHANOL TARTRATE 10 MG/ML NA SOLN
1.0000 | NASAL | Status: DC | PRN
  Administered 2020-09-02 – 2020-09-06 (×10): 1 via NASAL
  Filled 2020-09-02 (×2): qty 2.5

## 2020-09-02 MED ORDER — ACETAMINOPHEN 160 MG/5ML PO SOLN: 650.0000 mg | ORAL | Status: DC | PRN

## 2020-09-02 MED ORDER — PANTOPRAZOLE SODIUM 40 MG IV SOLR
40.0000 mg | Freq: Every day | INTRAVENOUS | Status: DC
  Administered 2020-09-02: 40 mg via INTRAVENOUS
  Filled 2020-09-02: qty 40

## 2020-09-02 MED ORDER — LEVOTHYROXINE SODIUM 100 MCG/5ML IV SOLN: 37.0000 ug | Freq: Every day | INTRAVENOUS | Status: DC

## 2020-09-02 MED ORDER — GADOBUTROL 1 MMOL/ML IV SOLN
7.0000 mL | Freq: Once | INTRAVENOUS | Status: AC | PRN
  Administered 2020-09-02: 7 mL via INTRAVENOUS

## 2020-09-02 MED ORDER — SENNOSIDES-DOCUSATE SODIUM 8.6-50 MG PO TABS
1.0000 | ORAL_TABLET | Freq: Two times a day (BID) | ORAL | Status: DC
  Administered 2020-09-03 – 2020-09-04 (×3): 1 via ORAL
  Filled 2020-09-02 (×7): qty 1

## 2020-09-02 MED ORDER — PHENOBARBITAL 32.4 MG PO TABS
97.2000 mg | ORAL_TABLET | ORAL | Status: DC
  Administered 2020-09-03: 97.2 mg via ORAL
  Filled 2020-09-02: qty 3

## 2020-09-02 MED ORDER — LABETALOL HCL 5 MG/ML IV SOLN
10.0000 mg | Freq: Once | INTRAVENOUS | Status: DC
  Filled 2020-09-02: qty 4

## 2020-09-02 MED ORDER — STROKE: EARLY STAGES OF RECOVERY BOOK
Freq: Once | Status: AC
  Administered 2020-09-02: 1
  Filled 2020-09-02: qty 1

## 2020-09-02 MED ORDER — PHENOBARBITAL 32.4 MG PO TABS: 97.2000 mg | ORAL_TABLET | ORAL | Status: DC

## 2020-09-02 MED ORDER — ATORVASTATIN CALCIUM 40 MG PO TABS
40.0000 mg | ORAL_TABLET | Freq: Every day | ORAL | Status: DC
  Administered 2020-09-02: 40 mg via ORAL
  Filled 2020-09-02: qty 1

## 2020-09-02 MED ORDER — INSULIN ASPART 100 UNIT/ML ~~LOC~~ SOLN: 0.0000 [IU] | SUBCUTANEOUS | Status: DC

## 2020-09-02 NOTE — ED Triage Notes (Signed)
Per [facility]: Per family last night at 2100 pt had left facial droop that has resolved with weakness to all extremities with decreased sensation to all extremities. Reportedly right leg and foot are always numb. Negative stroke screen with EMS. Pt alert and oriented with EMS. Pt did state that she fell last night around 9 pm. Not on blood thinners.

## 2020-09-02 NOTE — ED Provider Notes (Signed)
[facility] EMERGENCY DEPARTMENT Provider Note   CSN: [id number] Arrival date & time: 09/02/20  1005     History Chief Complaint  Patient presents with  . Extremity Weakness    All extremities    Natalie [name] is a 69 y.o. female with a past medical history of chronic pain syndrome, seizure disorder, hypertension, CKD, chronic migraines presenting to the ED with chief complaint of weakness and facial droop.  Yesterday around 9 PM had a fall but did not hit her head denies any loss of consciousness.  States that she has had trouble ambulating since right foot surgery for a Lisfranc fracture last year.  She has baseline numbness to bilateral lower extremities that has been mild.  Her symptoms worsen last night bilaterally as well as bilateral upper extremity paresthesias and facial droop.  She went to sleep although had trouble sleeping.  When she woke up her headache from yesterday had gradually improved as well as her worsening numbness in her legs.  She reports mild persistent left arm paresthesias.  States that her facial droop has improved.  She denies any history of similar symptoms in the past.  Denies any anticoagulant use.  Denies any chest pain, shortness of breath, fever, neck stiffness, back pain, loss of bowel or bladder function.  HPI     Past Medical History:  Diagnosis Date  . Arthritis   . Atrophy of left kidney   . Cervicalgia   . Chronic kidney disease, stage 3 ([place])    acute aki 03-14-2018 in setting pyelonehritis-- resolved  . Chronic migraine   . Chronic pain syndrome   . History of kidney stones   . History of pyelonephritis    hx recurrent  . History of recurrent UTIs   . History of sepsis    07/ 2017 secondary to pyelonephritis  . Hypertension   . Hypothyroidism   . Iron deficiency anemia   . Left ureteral stone   . Mixed hyperlipidemia   . Nephrolithiasis    right side nonobstructive per CT 03-14-2018  . Right shoulder pain    post  sx 02-18-2018  . Seizure disorder ([place]) followed by pcp   per pt dx age 66--  no seizure since 1980's , controlled w/ phenobarbitol  . Type 2 diabetes mellitus ([place])    followed by pcp  . Wears glasses     Patient Active Problem List   Diagnosis Date Noted  . ICH (intracerebral hemorrhage) ([place]) 09/02/2020  . Chronic pain syndrome 03/16/2018  . Encounter for long-term (current) use of high-risk medication 03/16/2018  . Complicated UTI (urinary tract infection) 03/14/2018  . Acute renal failure superimposed on stage 3 chronic kidney disease ([place]) 03/14/2018  . Hypotension 03/14/2018  . Generalized weakness   . Chronic kidney disease 09/06/2016  . Anemia, iron deficiency 06/23/2016  . Vitamin B12 deficiency (dietary) anemia 06/23/2016  . Migraine, chronic, without aura, intractable, with status migrainosus 01/04/2016  . Abdominal pain   . Pyelonephritis 08/31/2014  . 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 Renal stone 05/14/2013  . BMI 33.0-33.9,adult 03/30/2013  . Recurrent UTI 09/17/2012  . Insulin dependent diabetes with renal manifestation 03/19/2012  . HTN (hypertension) 03/19/2012  . Hyperlipemia 03/19/2012  . Hypothyroidism 03/19/2012  . Seizure disorder ([place]) 03/19/2012  . Insomnia 03/19/2012  . Migraine 03/19/2012    Past Surgical History:  Procedure Laterality Date  . CESAREAN SECTION  1974  . CHOLECYSTECTOMY OPEN  2002  . CYSTO/  LEFT RETROGRADE PYELOGRAM  06-11-2006   dr Jeffie Pollock  [facility]  . CYSTOSCOPY WITH RETROGRADE PYELOGRAM, URETEROSCOPY AND STENT PLACEMENT Left 04/10/2018   Procedure: CYSTOSCOPY WITH RETROGRADE PYELOGRAM, URETEROSCOPY AND STENT PLACEMENT;  Surgeon: Franchot Gallo, MD;  Location: [facility];  Service: Urology;  Laterality: Left;  . FOOT ARTHRODESIS Right 09/10/2019   Procedure: ARTHRODESIS FOOT;  Surgeon: Erle Crocker, MD;  Location: [facility];  Service:  Orthopedics;  Laterality: Right;  . IR URETERAL STENT PLACEMENT EXISTING ACCESS RIGHT  08-21-2007    dr Karsten Ro  [facility]  . LEFT RETROGRADE PYELOGRAM/ LEFT URETERAL STENT PLACEMENT  02-25-2001   dr Jeffie Pollock [facility]  . NEPHROLITHOTOMY Right 05/14/2016   Procedure: NEPHROLITHOTOMY PERCUTANEOUS;  Surgeon: Franchot Gallo, MD;  Location: [facility];  Service: Urology;  Laterality: Right;  . OPEN REDUCTION INTERNAL FIXATION (ORIF) FOOT LISFRANC FRACTURE Right 09/10/2019   Procedure: OPEN REDUCTION INTERNAL FIXATION (ORIF) FOOT LISFRANC FRACTURE;  Surgeon: Erle Crocker, MD;  Location: [facility];  Service: Orthopedics;  Laterality: Right;  . REPAIR EXTENSOR TENDON WITH METATARSAL OSTEOTOMY AND OPEN REDUCTION IN Right 09/10/2019   Procedure: OPEN TREATMENT RIGHT FIRST, SECOND, THIRD TARSOMETATARSAL LISFRANC, MIDFOOT FUSION;  Surgeon: Erle Crocker, MD;  Location: [facility];  Service: Orthopedics;  Laterality: Right;  . SHOULDER ARTHROSCOPY Bilateral left 11-13-2000;  right 02-18-2018     OB History   No obstetric history on file.     Family History  Problem Relation Age of Onset  . Heart disease Father   . Diabetes Father     Social History   Tobacco Use  . Smoking status: Never Smoker  . Smokeless tobacco: Never Used  Vaping Use  . Vaping Use: Never used  Substance Use Topics  . Alcohol use: No    Alcohol/week: 0.0 standard drinks  . Drug use: No    Home Medications Prior to Admission medications   Medication Sig Start Date End Date Taking? Authorizing Provider  albuterol (VENTOLIN HFA) 108 (90 Base) MCG/ACT inhaler Inhale 2 puffs into the lungs every 4 (four) hours as needed for wheezing or shortness of breath. 05/24/20  Yes Hall-Potvin, [place], PA-C  amLODipine (NORVASC) 5 MG tablet Take 1 tablet (5 mg total) by mouth daily. Patient taking differently: Take 5 mg by mouth daily with supper.  02/08/18  Yes Shawnee Knapp, MD  atorvastatin (LIPITOR)  40 MG tablet Take 1 tablet (40 mg total) by mouth daily at 6 PM. 08/07/18  Yes Shawnee Knapp, MD  butorphanol (STADOL) 10 MG/ML nasal spray PLACE 1 SPRAY INTO THE NOSE EVERY 4 HOURS AS NEEDED FOR MIGRAINE. Patient taking differently: Place 1 spray into the nose every 4 (four) hours as needed for headache or migraine. PLACE 1 SPRAY INTO THE NOSE EVERY 4 HOURS AS NEEDED FOR MIGRAINE. 10/08/18  Yes Shawnee Knapp, MD  glipiZIDE (GLUCOTROL) 10 MG tablet TAKE 2 TABLETS (20 MG TOTAL) BY MOUTH 2 (TWO) TIMES DAILY BEFORE A MEAL. Patient taking differently: Take 10 mg by mouth daily before breakfast.  11/17/18  Yes Shawnee Knapp, MD  insulin NPH Human (HUMULIN N,NOVOLIN N) 100 UNIT/ML injection Inject 0.05 mLs (5 Units total) into the skin 2 (two) times daily before a meal. Patient taking differently: Inject 25 Units into the skin 2 (two) times daily before a meal.  10/17/18  Yes Rutherford Guys, MD  levothyroxine (SYNTHROID, LEVOTHROID) 75 MCG tablet Take 1 tablet (  75 mcg total) by mouth daily. 08/07/18  Yes Shawnee Knapp, MD  olmesartan (BENICAR) 40 MG tablet Take 1 tablet (40 mg total) by mouth daily. 11/03/18  Yes Shawnee Knapp, MD  PHENobarbital (LUMINAL) 97.2 MG tablet TAKE 2 TABLETS BY MOUTH ONCE DAILY ON MON,WED,AND FRI AND 1 TABLET DAILY ON OTHER DAYS Patient taking differently: Take 97.2-194.4 mg by mouth See admin instructions. TAKE 2 TABLETS (194.4 MG TOTALLY) BY MOUTH ON MON, FRI, SUN; TAKE 1 TABLET (97.2 MG TOTALLY) DAILY ON OTHER DAYS 08/07/18  Yes Shawnee Knapp, MD  tiZANidine (ZANAFLEX) 4 MG tablet TAKE 1 TABLET BY MOUTH EVERY 4-6 HOURS AS NEEDED FOR MUSCLE SPASM Patient taking differently: Take 4 mg by mouth every 4 (four) hours as needed for muscle spasms.  01/08/19  Yes Stallings, Zoe A, MD  zolpidem (AMBIEN) 10 MG tablet take 1 tablet by mouth at bedtime for sleep if needed. Patient taking differently: Take 10 mg by mouth at bedtime as needed for sleep. take 1 tablet by mouth at bedtime for sleep if needed.  05/08/18  Yes Shawnee Knapp, MD  benzonatate (TESSALON) 100 MG capsule Take 1 capsule (100 mg total) by mouth every 8 (eight) hours. Patient not taking: Reported on 09/02/2020 05/24/20   Hall-Potvin, [place], PA-C  cetirizine (ZYRTEC ALLERGY) 10 MG tablet Take 1 tablet (10 mg total) by mouth daily. Patient not taking: Reported on 09/02/2020 05/24/20   Hall-Potvin, [place], PA-C  fluticasone (FLONASE) 50 MCG/ACT nasal spray Place 1 spray into both nostrils daily. Patient not taking: Reported on 09/02/2020 05/24/20   Hall-Potvin, [place], PA-C  Insulin Syringe-Needle U-100 (INSULIN SYRINGE 1CC/31GX5/16") 31G X 5/16" 1 ML MISC 1 Units by Does not apply route as directed. Dx: E11.29 11/10/18   Shawnee Knapp, MD  predniSONE (DELTASONE) 50 MG tablet Take 1 tablet (50 mg total) by mouth daily with breakfast. Patient not taking: Reported on 09/02/2020 05/24/20   Hall-Potvin, [place], PA-C    Allergies    Compazine, Cymbalta [duloxetine hcl], Methadone, Escitalopram, and Macrobid [nitrofurantoin monohyd macro]  Review of Systems   Review of Systems  Constitutional: Negative for appetite change, chills and fever.  HENT: Negative for ear pain, rhinorrhea, sneezing and sore throat.   Eyes: Negative for photophobia and visual disturbance.  Respiratory: Negative for cough, chest tightness, shortness of breath and wheezing.   Cardiovascular: Negative for chest pain and palpitations.  Gastrointestinal: Negative for abdominal pain, blood in stool, constipation, diarrhea, nausea and vomiting.  Genitourinary: Negative for dysuria, hematuria and urgency.  Musculoskeletal: Negative for myalgias.  Skin: Negative for rash.  Neurological: Positive for facial asymmetry, numbness and headaches. Negative for dizziness, weakness and light-headedness.    Physical Exam Updated Vital Signs BP (!) 141/57   Pulse 79   Temp 98.7 F (37.1 C) (Oral)   Resp (!) 23   SpO2 93%   Physical Exam Vitals and nursing note reviewed.    Constitutional:      General: She is not in acute distress.    Appearance: She is well-developed.  HENT:     Head: Normocephalic and atraumatic.     Nose: Nose normal.  Eyes:     General: No scleral icterus.       Right eye: No discharge.        Left eye: No discharge.     Conjunctiva/sclera: Conjunctivae normal.     Pupils: Pupils are equal, round, and reactive to light.  Cardiovascular:     Rate and  Rhythm: Normal rate and regular rhythm.     Heart sounds: Normal heart sounds. No murmur heard.  No friction rub. No gallop.   Pulmonary:     Effort: Pulmonary effort is normal. No respiratory distress.     Breath sounds: Normal breath sounds.  Abdominal:     General: Bowel sounds are normal. There is no distension.     Palpations: Abdomen is soft.     Tenderness: There is no abdominal tenderness. There is no guarding.  Musculoskeletal:        General: Normal range of motion.     Cervical back: Normal range of motion and neck supple.  Skin:    General: Skin is warm and dry.     Findings: No rash.  Neurological:     General: No focal deficit present.     Mental Status: She is alert and oriented to person, place, and time.     Cranial Nerves: No cranial nerve deficit.     Sensory: No sensory deficit.     Motor: No weakness or abnormal muscle tone.     Coordination: Coordination normal.     Comments: Pupils reactive. No facial asymmetry noted. Cranial nerves appear grossly intact. Sensation intact to light touch on face, BUE and BLE. Strength 5/5 in BUE and BLE.      ED Results / Procedures / Treatments   Labs (all labs ordered are listed, but only abnormal results are displayed) Labs Reviewed  CBC - Abnormal; Notable for the following components:      Result Value   Hemoglobin 11.4 (*)    All other components within normal limits  COMPREHENSIVE METABOLIC PANEL - Abnormal; Notable for the following components:   Glucose, Bld 178 (*)    Creatinine, Ser 1.11 (*)    GFR,  Estimated 53 (*)    All other components within normal limits  I-STAT CHEM 8, ED - Abnormal; Notable for the following components:   Glucose, Bld 175 (*)    All other components within normal limits  URINE CULTURE  RESP PANEL BY RT-PCR (FLU A&B, COVID) ARPGX2  ETHANOL  PROTIME-INR  APTT  DIFFERENTIAL  PHENOBARBITAL LEVEL  RAPID URINE DRUG SCREEN, HOSP PERFORMED  URINALYSIS, ROUTINE W REFLEX MICROSCOPIC  LIPID PANEL  HEMOGLOBIN A1C    EKG EKG Interpretation  Date/Time:  Friday September 02 2020 10:11:45 EST Ventricular Rate:  89 PR Interval:    QRS Duration: 95 QT Interval:  402 QTC Calculation: 490 R Axis:   -48 Text Interpretation: Sinus rhythm Left anterior fascicular block Low voltage, extremity leads Borderline prolonged QT interval Confirmed by Dene Gentry [phone]) on 09/02/2020 10:17:22 AM   Radiology CT HEAD WO CONTRAST  Result Date: 09/02/2020 CLINICAL DATA:  Facial droop with extremity numbness EXAM: CT HEAD WITHOUT CONTRAST CT CERVICAL SPINE WITHOUT CONTRAST TECHNIQUE: Multidetector CT imaging of the head and cervical spine was performed following the standard protocol without intravenous contrast. Multiplanar CT image reconstructions of the cervical spine were also generated. COMPARISON:  08/09/2019 FINDINGS: CT HEAD FINDINGS Brain: 22 x 11 x 16 mm hematoma in the posterior right corona radiata. There is early right lateral ventricular extension. The hematoma has an irregular shape with interposed bands of parenchyma. Minimal adjacent edema. No hydrocephalus, extra-axial collection, or masslike finding. Chronic small vessel ischemic change in the cerebral white matter. Chronic lacune in the right thalamus. Vascular: Atheromatous calcifications. Skull: Normal. Negative for fracture or focal lesion. Sinuses/Orbits: Bilateral cataract resection. CT CERVICAL SPINE FINDINGS Alignment:  No traumatic malalignment Skull base and vertebrae: No acute fracture Soft tissues and spinal  canal: No prevertebral fluid or swelling. No visible canal hematoma. Disc levels:  Ordinary degenerative changes Upper chest: No acute finding Critical Value/emergent results were called by telephone at the time of interpretation on 09/02/2020 at 11:07 am to provider [facility] , who verbally acknowledged these results. IMPRESSION: 1. 2 cc acute hematoma in the right corona radiata. There is early extension into the right lateral ventricle. 2. Background chronic small vessel disease. 3. No acute finding in the cervical spine. Electronically Signed   By: Monte Fantasia M.D.   On: 09/02/2020 11:08   CT CERVICAL SPINE WO CONTRAST  Result Date: 09/02/2020 CLINICAL DATA:  Facial droop with extremity numbness EXAM: CT HEAD WITHOUT CONTRAST CT CERVICAL SPINE WITHOUT CONTRAST TECHNIQUE: Multidetector CT imaging of the head and cervical spine was performed following the standard protocol without intravenous contrast. Multiplanar CT image reconstructions of the cervical spine were also generated. COMPARISON:  08/09/2019 FINDINGS: CT HEAD FINDINGS Brain: 22 x 11 x 16 mm hematoma in the posterior right corona radiata. There is early right lateral ventricular extension. The hematoma has an irregular shape with interposed bands of parenchyma. Minimal adjacent edema. No hydrocephalus, extra-axial collection, or masslike finding. Chronic small vessel ischemic change in the cerebral white matter. Chronic lacune in the right thalamus. Vascular: Atheromatous calcifications. Skull: Normal. Negative for fracture or focal lesion. Sinuses/Orbits: Bilateral cataract resection. CT CERVICAL SPINE FINDINGS Alignment: No traumatic malalignment Skull base and vertebrae: No acute fracture Soft tissues and spinal canal: No prevertebral fluid or swelling. No visible canal hematoma. Disc levels:  Ordinary degenerative changes Upper chest: No acute finding Critical Value/emergent results were called by telephone at the time of interpretation on  09/02/2020 at 11:07 am to provider [facility] , who verbally acknowledged these results. IMPRESSION: 1. 2 cc acute hematoma in the right corona radiata. There is early extension into the right lateral ventricle. 2. Background chronic small vessel disease. 3. No acute finding in the cervical spine. Electronically Signed   By: Monte Fantasia M.D.   On: 09/02/2020 11:08    Procedures .Critical Care Performed by: Delia Heady, PA-C Authorized by: Delia Heady, PA-C   Critical care provider statement:    Critical care time (minutes):  45   Critical care was necessary to treat or prevent imminent or life-threatening deterioration of the following conditions:  Circulatory failure and CNS failure or compromise   Critical care was time spent personally by me on the following activities:  Development of treatment plan with patient or surrogate, discussions with consultants, evaluation of patient's response to treatment, examination of patient, obtaining history from patient or surrogate, ordering and performing treatments and interventions, ordering and review of laboratory studies, ordering and review of radiographic studies, re-evaluation of patient's condition and review of old charts   I assumed direction of critical care for this patient from another provider in my specialty: no     (including critical care time)  Medications Ordered in ED Medications   stroke: mapping our early stages of recovery book (has no administration in time range)  acetaminophen (TYLENOL) tablet 650 mg (has no administration in time range)    Or  acetaminophen (TYLENOL) 160 MG/5ML solution 650 mg (has no administration in time range)    Or  acetaminophen (TYLENOL) suppository 650 mg (has no administration in time range)  senna-docusate (Senokot-S) tablet 1 tablet (has no administration in time range)  pantoprazole (PROTONIX)  injection 40 mg (has no administration in time range)  labetalol (NORMODYNE) injection 20 mg  (10 mg Intravenous Given 09/02/20 1200)    And  clevidipine (CLEVIPREX) infusion 0.5 mg/mL (2 mg/hr Intravenous New Bag/Given 09/02/20 1202)  levothyroxine (SYNTHROID, LEVOTHROID) injection 37 mcg (has no administration in time range)    ED Course  I have reviewed the triage vital signs and the nursing notes.  Pertinent labs & imaging results that were available during my care of the patient were reviewed by me and considered in my medical decision making (see chart for details).  Clinical Course as of Sep 02 1253  Fri Sep 02, 2020  1026 Patient reports history of seizure disorder and has been compliant with her phenobarbital.  She does not feel that she had a seizure and daily member at the bedside agrees that there was no seizure-like activity yesterday.   [HK]  1117 Shows 2 cm hematoma in the right corona radiata with early extension into the right lateral ventricle.  CT HEAD WO CONTRAST [HK]    Clinical Course User Index [HK] Delia Heady, PA-C   MDM Rules/Calculators/A&P                          69 year old female with a past medical history of chronic pain syndrome, seizure disorder on phenobarbital, hypertension, CKD, chronic migraines presenting to the ED with a chief complaint of weakness and facial droop.  Yesterday around 9 PM had a fall but denies any head injury.  She has had trouble ambulating since her right foot surgery last year. Reports baseline numbness in to BLE but this worsened last night and reports bilateral upper extremity paresthesias and left facial droop as well.  Since waking up her facial droop has resolved, states that her numbness is back to her baseline but continues to have left arm paresthesias.  No history of similar symptoms in the past with her facial droop.  No anticoagulant use.  No back pain, loss of bowel or bladder function, urinary symptoms or fever. On exam there is normal strength and sensation noted of bilateral upper and lower extremities.  No  obvious facial droop or changes to sensation of the face.  She denies any chest pain, shortness of breath, vomiting or diarrhea.  Abdomen is soft. EKG shows sinus rhythm, borderline prolonged QT.  Lab work including CBC, CMP unremarkable.  Normal CBG.  CT of the cervical spine without any acute abnormalities however CT of the head shows 2 cm acute hematoma in the left radiata. Will admit to neurology service, order blood pressure control per their request. Appreciate the help of neurologist for management of this patient.   Portions of this note were generated with [employer]. Dictation errors may occur despite best attempts at proofreading.  Final Clinical Impression(s) / ED Diagnoses Final diagnoses:  Right-sided nontraumatic intracerebral hemorrhage, unspecified cerebral location [facility])    Rx / DC Orders ED Discharge Orders    None       Delia Heady, PA-C 09/02/20 1255    Valarie Merino, MD 09/04/20 1718

## 2020-09-02 NOTE — ED Notes (Signed)
Pt in MRI with stroke RN, care to be handed off to floor RN in MRI.

## 2020-09-02 NOTE — ED Notes (Signed)
Called [facility] for code stroke 1142 per DR. Rory Percy

## 2020-09-02 NOTE — Code Documentation (Signed)
Stroke Response Nurse Documentation Code Stroke Documentation  Natalie [name] is a 69 y.o. F arriving to [place] ED via [facility] on 09/02/2020 with PMH of HTN, DM, CKD, HLD, seizure. Code stroke was activated by ED . Patient from home where she was LKW at 2100 and now complaining of decreased sensation and weakness on L side. Per patient she did fall at home last night. Patient taking No antithrombotic PTA. Stroke team to the bedside on initiation of code stroke,  patient cleared for CT by Mal Misty, PA on arrival to ED. Patient taken to MRI with team. NIHSS 3, see documentation for details and code stroke times. Patient with left arm weakness, left leg weakness, left decreased sensation and dysarthria  on exam. The following imaging was completed:  CT, CT C-spine, MRI. Patient is not a candidate for tPA. Care/Plan: SBP<140, admit to ICU per Dr. Rory Percy for BP mgmt. Bedside handoff with ICU RN Anderson Malta.    Natalie [name]  Stroke Response RN [phone]

## 2020-09-02 NOTE — H&P (Addendum)
Neurology [place] H&P  CC: Generalized weakness, left-sided weakness, left facial droop  History is obtained from: Chart, patient's daughter, patient  HPI: Natalie [name] is a 69 y.o. female past medical history of chronic headaches chronic migraine, chronic pain syndrome, hypertension, hyperlipidemia, type 2 diabetes, seizures since the age of 89, who presented to the emergency room for evaluation after a near fall where her left leg gave out.  She says that she has numbness in both lower extremities for a long time but she felt that her left leg was weaker and gave out yesterday.  Last known normal was somewhere around 8 or 9 PM yesterday. She also was noted to have left-sided facial droop by family yesterday but that has since resolved.  Speech might also be a little slurred although family says it is normal. Reports a mild headache.  Denies any visual changes.  Denies any other recent illnesses or sicknesses.  Denies any sick contacts.  Denies chest pain shortness of breath nausea vomiting. Noncontrast head CT completed in the emergency room showed 2 cc acute ICH in the right corona radiata.  Early extension into the right lateral ventricle. Neurology was consulted for admission Patient denies any preceding seizures.  LKW: Before going to bed at 9 PM yesterday 09/01/2020. tpa given?: no, ICH Premorbid modified Rankin scale (mRS): 0.  ICH score-1 - IVH  ROS: Performed and negative except as noted in HPI  Past Medical History:  Diagnosis Date  . Arthritis   . Atrophy of left kidney   . Cervicalgia   . Chronic kidney disease, stage 3 ([place])    acute aki 03-14-2018 in setting pyelonehritis-- resolved  . Chronic migraine   . Chronic pain syndrome   . History of kidney stones   . History of pyelonephritis    hx recurrent  . History of recurrent UTIs   . History of sepsis    07/ 2017 secondary to pyelonephritis  . Hypertension   . Hypothyroidism   . Iron deficiency anemia   . Left  ureteral stone   . Mixed hyperlipidemia   . Nephrolithiasis    right side nonobstructive per CT 03-14-2018  . Right shoulder pain    post sx 02-18-2018  . Seizure disorder ([place]) followed by pcp   per pt dx age 33--  no seizure since 1980's , controlled w/ phenobarbitol  . Type 2 diabetes mellitus ([place])    followed by pcp  . Wears glasses     Family History  Problem Relation Age of Onset  . Heart disease Father   . Diabetes Father     Social History:   reports that she has never smoked. She has never used smokeless tobacco. She reports that she does not drink alcohol and does not use drugs.   Medications  Current Facility-Administered Medications:  .   stroke: mapping our early stages of recovery book, , Does not apply, Once, Amie Portland, MD .  acetaminophen (TYLENOL) tablet 650 mg, 650 mg, Oral, Q4H PRN **OR** acetaminophen (TYLENOL) 160 MG/5ML solution 650 mg, 650 mg, Per Tube, Q4H PRN **OR** acetaminophen (TYLENOL) suppository 650 mg, 650 mg, Rectal, Q4H PRN, Amie Portland, MD .  clevidipine (CLEVIPREX) infusion 0.5 mg/mL, 0-21 mg/hr, Intravenous, Continuous, Khatri, Hina, PA-C .  labetalol (NORMODYNE) injection 20 mg, 20 mg, Intravenous, Once **AND** clevidipine (CLEVIPREX) infusion 0.5 mg/mL, 0-21 mg/hr, Intravenous, Continuous, Amie Portland, MD .  labetalol (NORMODYNE) injection 10 mg, 10 mg, Intravenous, Once, Khatri, Hina, PA-C .  pantoprazole (  PROTONIX) injection 40 mg, 40 mg, Intravenous, QHS, Amie Portland, MD .  senna-docusate (Senokot-S) tablet 1 tablet, 1 tablet, Oral, BID, Amie Portland, MD  Current Outpatient Medications:  .  albuterol (VENTOLIN HFA) 108 (90 Base) MCG/ACT inhaler, Inhale 2 puffs into the lungs every 4 (four) hours as needed for wheezing or shortness of breath., Disp: 18 g, Rfl: 0 .  amLODipine (NORVASC) 5 MG tablet, Take 1 tablet (5 mg total) by mouth daily. (Patient taking differently: Take 5 mg by mouth daily with supper. ), Disp: 90 tablet,  Rfl: 3 .  atorvastatin (LIPITOR) 40 MG tablet, Take 1 tablet (40 mg total) by mouth daily at 6 PM., Disp: 90 tablet, Rfl: 1 .  butorphanol (STADOL) 10 MG/ML nasal spray, PLACE 1 SPRAY INTO THE NOSE EVERY 4 HOURS AS NEEDED FOR MIGRAINE. (Patient taking differently: Place 1 spray into the nose every 4 (four) hours as needed for headache or migraine. PLACE 1 SPRAY INTO THE NOSE EVERY 4 HOURS AS NEEDED FOR MIGRAINE.), Disp: 2.5 mL, Rfl: 5 .  glipiZIDE (GLUCOTROL) 10 MG tablet, TAKE 2 TABLETS (20 MG TOTAL) BY MOUTH 2 (TWO) TIMES DAILY BEFORE A MEAL. (Patient taking differently: Take 10 mg by mouth daily before breakfast. ), Disp: 360 tablet, Rfl: 0 .  insulin NPH Human (HUMULIN N,NOVOLIN N) 100 UNIT/ML injection, Inject 0.05 mLs (5 Units total) into the skin 2 (two) times daily before a meal. (Patient taking differently: Inject 25 Units into the skin 2 (two) times daily before a meal. ), Disp: 10 mL, Rfl: 0 .  levothyroxine (SYNTHROID, LEVOTHROID) 75 MCG tablet, Take 1 tablet (75 mcg total) by mouth daily., Disp: 90 tablet, Rfl: 1 .  olmesartan (BENICAR) 40 MG tablet, Take 1 tablet (40 mg total) by mouth daily., Disp: 90 tablet, Rfl: 0 .  PHENobarbital (LUMINAL) 97.2 MG tablet, TAKE 2 TABLETS BY MOUTH ONCE DAILY ON MON,WED,AND FRI AND 1 TABLET DAILY ON OTHER DAYS (Patient taking differently: Take 97.2-194.4 mg by mouth See admin instructions. TAKE 2 TABLETS (194.4 MG TOTALLY) BY MOUTH ON MON, FRI, SUN; TAKE 1 TABLET (97.2 MG TOTALLY) DAILY ON OTHER DAYS), Disp: 135 tablet, Rfl: 1 .  tiZANidine (ZANAFLEX) 4 MG tablet, TAKE 1 TABLET BY MOUTH EVERY 4-6 HOURS AS NEEDED FOR MUSCLE SPASM (Patient taking differently: Take 4 mg by mouth every 4 (four) hours as needed for muscle spasms. ), Disp: 180 tablet, Rfl: 1 .  zolpidem (AMBIEN) 10 MG tablet, take 1 tablet by mouth at bedtime for sleep if needed. (Patient taking differently: Take 10 mg by mouth at bedtime as needed for sleep. take 1 tablet by mouth at bedtime for  sleep if needed.), Disp: 90 tablet, Rfl: 1 .  benzonatate (TESSALON) 100 MG capsule, Take 1 capsule (100 mg total) by mouth every 8 (eight) hours. (Patient not taking: Reported on 09/02/2020), Disp: 21 capsule, Rfl: 0 .  cetirizine (ZYRTEC ALLERGY) 10 MG tablet, Take 1 tablet (10 mg total) by mouth daily. (Patient not taking: Reported on 09/02/2020), Disp: 30 tablet, Rfl: 0 .  fluticasone (FLONASE) 50 MCG/ACT nasal spray, Place 1 spray into both nostrils daily. (Patient not taking: Reported on 09/02/2020), Disp: 16 g, Rfl: 0 .  Insulin Syringe-Needle U-100 (INSULIN SYRINGE 1CC/31GX5/16") 31G X 5/16" 1 ML MISC, 1 Units by Does not apply route as directed. Dx: E11.29, Disp: 200 each, Rfl: 3 .  predniSONE (DELTASONE) 50 MG tablet, Take 1 tablet (50 mg total) by mouth daily with breakfast. (Patient not taking: Reported  on 09/02/2020), Disp: 5 tablet, Rfl: 0  Exam: Current vital signs: BP (!) 146/75   Pulse 82   Temp 98.7 F (37.1 C) (Oral)   Resp 14   SpO2 97%  Vital signs in last 24 hours: Temp:  [98.7 F (37.1 C)] 98.7 F (37.1 C) (12/03 1012) Pulse Rate:  [82-87] 82 (12/03 1015) Resp:  [14-16] 14 (12/03 1015) BP: (146-159)/(71-75) 146/75 (12/03 1015) SpO2:  [96 %-98 %] 97 % (12/03 1015) General: Awake alert in no distress HEENT: Normocephalic atraumatic Lungs: Clear Cardiovascular: Regular rhythm Abdomen soft nondistended nontender Extremities warm well perfused Neurological exam Awake alert oriented x3 Mild dysarthria.  No aphasia Cranial nerves: Pupils equal round react to light, extraocular movements intact, visual fields full, face to be at rest and on smiling appeared symmetric, facial sensation intact, already daily mildly reduced bilaterally, tongue and palate midline.  Shoulder shrug intact. Motor exam: There is no vertical drift but left upper extremity is 4/5 in comparison to the right.  Both lower extremities are 4+/5 in strength.  Normal tone.  Normal range of motion with some  tenderness on the left arm Sensory exam: Diminished on the left in comparison to the right.  Also has some length dependent neuropathic exam. Coordination: No dysmetria Gait testing deferred at this time NIH stroke scale-2  Labs I have reviewed labs in epic and the results pertinent to this consultation are:  CBC    Component Value Date/Time   WBC 8.4 09/02/2020 1029   RBC 3.92 09/02/2020 1029   HGB 11.4 (L) 09/02/2020 1029   HGB 11.0 (L) 10/08/2018 1504   HCT 37.0 09/02/2020 1029   HCT 33.6 (L) 10/08/2018 1504   PLT 267 09/02/2020 1029   PLT 278 10/08/2018 1504   MCV 94.4 09/02/2020 1029   MCV 91.2 12/01/2018 1624   MCV 93 10/08/2018 1504   MCH 29.1 09/02/2020 1029   MCHC 30.8 09/02/2020 1029   RDW 14.6 09/02/2020 1029   RDW 13.1 10/08/2018 1504   LYMPHSABS 1.1 09/02/2020 1029   LYMPHSABS 1.6 10/08/2018 1504   MONOABS 0.9 09/02/2020 1029   EOSABS 0.1 09/02/2020 1029   EOSABS 0.2 10/08/2018 1504   BASOSABS 0.0 09/02/2020 1029   BASOSABS 0.1 10/08/2018 1504    CMP     Component Value Date/Time   NA 140 09/02/2020 1029   NA 141 12/01/2018 1653   K 3.5 09/02/2020 1029   CL 104 09/02/2020 1029   CO2 26 09/02/2020 1029   GLUCOSE 178 (H) 09/02/2020 1029   BUN 15 09/02/2020 1029   BUN 21 12/01/2018 1653   CREATININE 1.11 (H) 09/02/2020 1029   CREATININE 1.13 (H) 07/12/2016 1146   CALCIUM 9.7 09/02/2020 1029   PROT 6.9 09/02/2020 1029   PROT 6.2 12/01/2018 1653   ALBUMIN 3.8 09/02/2020 1029   ALBUMIN 4.2 12/01/2018 1653   AST 21 09/02/2020 1029   ALT 16 09/02/2020 1029   ALKPHOS 88 09/02/2020 1029   BILITOT 0.5 09/02/2020 1029   BILITOT <0.2 12/01/2018 1653   GFRNONAA 53 (L) 09/02/2020 1029   GFRNONAA 37 (L) 06/21/2016 1209   GFRAA 46 (L) 09/07/2019 1000   GFRAA 43 (L) 06/21/2016 1209   Imaging I have reviewed the images obtained:  CT-scan of the brain-2 cc ICH in the right corona radiata with very subtle extension into the ventricle.  No mass-effect.  No  hydrocephalus.   Assessment: 69 year old with above past medical history presenting with sudden onset of lower extremity weakness  bilaterally more on the left than right, left arm weakness, left facial droop which has resolved and some mild dysarthria noted to have a small ICH with mild IVH extension in the right hemisphere. Likely hypertensive bleed given the location. Was hypertensive on arrival in the 160s. Will require admission for further management.  Plan: Subcortical ICH, nontraumatic IVH, nontraumatic  Acuity: Acute Laterality: right Current suspected etiology:   HTN Treatment: -Admit to NICU -ICH Score: 1 -ICH Volume: 2cc -BP control goal SYS<140 -PT/OT/ST  -neuromonitoring -Repeat CTH at 1800  CNS Local mass effect from [place] Compression of brain -Close neuro monitoring  Dysarthria Dysphagia following ICH  -NPO until cleared by stroke screen -ST -Advance diet as tolerated  Hemiplegia and hemiparesis following nontraumatic intracerebral hemorrhage affecting left non-dominant side  -Continue PT/OT/ST  History of seizures since 69 years of age -Continue phenobarb at home dose  RESP No active issues at this time Monitor clinically Maintain saturations in the normal range Chest x-ray  CV Essential (primary) hypertension Hypertensive Emergency -Aggressive BP control, goal SBP < 140 -Labetalol and hydralazine as needed.  If still not at goal, use Cleviprex. -TTE  GI/GU Acute Kidney Injury -Gentle hydration -avoid nephrotoxic agents  HEME Iron Deficiency Anemia -Monitor -transfuse for hgb < 7  No evidence of coagulopathy, not on any antiplatelets or anticoagulants at home. Normal PT/INR  ENDO Type 2 diabetes mellitus with hyperglycemia  -SSI -goal HgbA1c < 7  Hypothyroidism -Synthroid  Fluid/Electrolyte Disorders Check labs in the a.m.  Replete as necessary.  ID Possible Aspiration PNA -CXR -NPO -Monitor  Prophylaxis DVT: SCDs  only-no heparin/Lovenox.  No antiplatelets.  Patient has intracerebral hemorrhage. GI: PPI Bowel: Docusate senna  Dispo: TBD  Diet: NPO until cleared by speech  Code Status: Full Code-discussed with the daughter   THE FOLLOWING WERE PRESENT ON ADMISSION: ICH, IVH, essential hypertension, hypertensive emergency, possible aspiration pneumonia, seizure history, AKI, hemiparesis, dysarthria/dysphagia  -- Amie Portland, MD [facility] Pager: [phone] If 7pm to 7am, please call on call as listed on AMION.  CRITICAL CARE ATTESTATION Performed by: Amie Portland, MD Total critical care time: 41 minutes Critical care time was exclusive of separately billable procedures and treating other patients and/or supervising APPs/Residents/Students Critical care was necessary to treat or prevent imminent or life-threatening deterioration due to [place], HTN emergency This patient is critically ill and at significant risk for neurological worsening and/or death and care requires constant monitoring. Critical care was time spent personally by me on the following activities: development of treatment plan with patient and/or surrogate as well as nursing, discussions with consultants, evaluation of patient's response to treatment, examination of patient, obtaining history from patient or surrogate, ordering and performing treatments and interventions, ordering and review of laboratory studies, ordering and review of radiographic studies, pulse oximetry, re-evaluation of patient's condition, participation in multidisciplinary rounds and medical decision making of high complexity in the care of this patient.

## 2020-09-02 NOTE — Evaluation (Signed)
Speech Language Pathology Evaluation Patient Details Name: Natalie [name] MRN: [id number] DOB: [birth year] Today's Date: 09/02/2020 Time: 0263-7858 SLP Time Calculation (min) (ACUTE ONLY): 20 min  Problem List:  Patient Active Problem List   Diagnosis Date Noted  . ICH (intracerebral hemorrhage) ([place]) 09/02/2020  . Chronic pain syndrome 03/16/2018  . Encounter for long-term (current) use of high-risk medication 03/16/2018  . Complicated UTI (urinary tract infection) 03/14/2018  . Acute renal failure superimposed on stage 3 chronic kidney disease ([place]) 03/14/2018  . Hypotension 03/14/2018  . Generalized weakness   . Chronic kidney disease 09/06/2016  . Anemia, iron deficiency 06/23/2016  . Vitamin B12 deficiency (dietary) anemia 06/23/2016  . Migraine, chronic, without aura, intractable, with status migrainosus 01/04/2016  . Abdominal pain   . Pyelonephritis 08/31/2014  . Persistent microalbuminuria associated with type 2 diabetes mellitus ([place]) 12/28/2013  . Renal stone 05/14/2013  . BMI 33.0-33.9,adult 03/30/2013  . Recurrent UTI 09/17/2012  . Insulin dependent diabetes with renal manifestation 03/19/2012  . HTN (hypertension) 03/19/2012  . Hyperlipemia 03/19/2012  . Hypothyroidism 03/19/2012  . Seizure disorder ([place]) 03/19/2012  . Insomnia 03/19/2012  . Migraine 03/19/2012   Past Medical History:  Past Medical History:  Diagnosis Date  . Arthritis   . Atrophy of left kidney   . Cervicalgia   . Chronic kidney disease, stage 3 ([place])    acute aki 03-14-2018 in setting pyelonehritis-- resolved  . Chronic migraine   . Chronic pain syndrome   . History of kidney stones   . History of pyelonephritis    hx recurrent  . History of recurrent UTIs   . History of sepsis    07/ 2017 secondary to pyelonephritis  . Hypertension   . Hypothyroidism   . Iron deficiency anemia   . Left ureteral stone   . Mixed hyperlipidemia   . Nephrolithiasis    right side nonobstructive per CT  03-14-2018  . Right shoulder pain    post sx 02-18-2018  . Seizure disorder ([place]) followed by pcp   per pt dx age 69--  no seizure since 1980's , controlled w/ phenobarbitol  . Type 2 diabetes mellitus ([place])    followed by pcp  . Wears glasses    Past Surgical History:  Past Surgical History:  Procedure Laterality Date  . CESAREAN SECTION  1974  . CHOLECYSTECTOMY OPEN  2002  . CYSTO/  LEFT RETROGRADE PYELOGRAM  06-11-2006   dr Jeffie Pollock  [facility]  . CYSTOSCOPY WITH RETROGRADE PYELOGRAM, URETEROSCOPY AND STENT PLACEMENT Left 04/10/2018   Procedure: CYSTOSCOPY WITH RETROGRADE PYELOGRAM, URETEROSCOPY AND STENT PLACEMENT;  Surgeon: Franchot Gallo, MD;  Location: [facility];  Service: Urology;  Laterality: Left;  . FOOT ARTHRODESIS Right 09/10/2019   Procedure: ARTHRODESIS FOOT;  Surgeon: Erle Crocker, MD;  Location: [place];  Service: Orthopedics;  Laterality: Right;  . IR URETERAL STENT PLACEMENT EXISTING ACCESS RIGHT  08-21-2007    dr Karsten Ro  [facility]  . LEFT RETROGRADE PYELOGRAM/ LEFT URETERAL STENT PLACEMENT  02-25-2001   dr Jeffie Pollock [facility]  . NEPHROLITHOTOMY Right 05/14/2016   Procedure: NEPHROLITHOTOMY PERCUTANEOUS;  Surgeon: Franchot Gallo, MD;  Location: [facility];  Service: Urology;  Laterality: Right;  . OPEN REDUCTION INTERNAL FIXATION (ORIF) FOOT LISFRANC FRACTURE Right 09/10/2019   Procedure: OPEN REDUCTION INTERNAL FIXATION (ORIF) FOOT LISFRANC FRACTURE;  Surgeon: Erle Crocker, MD;  Location: [place];  Service: Orthopedics;  Laterality: Right;  . REPAIR EXTENSOR TENDON WITH METATARSAL OSTEOTOMY AND  OPEN REDUCTION IN Right 09/10/2019   Procedure: OPEN TREATMENT RIGHT FIRST, SECOND, THIRD TARSOMETATARSAL LISFRANC, MIDFOOT FUSION;  Surgeon: Erle Crocker, MD;  Location: [place];  Service: Orthopedics;  Laterality: Right;  . SHOULDER ARTHROSCOPY Bilateral left 11-13-2000;  right 02-18-2018   HPI:  Pt is  a 69 y.o. female past medical history of chronic headaches chronic migraine, chronic pain syndrome, hypertension, hyperlipidemia, type 2 diabetes, seizures since the age of 48, who presented to the emergency room for evaluation after a near fall where her left leg gave out. CTH showed 2 cc acute hematoma in the right corona radiata.   Assessment / Plan / Recommendation Clinical Impression  Pt scored 22/30 on the SLUMS, suggestive of mild cognitive difficulties, although she denies any acute changes in function. She is able to tell me specific details about hospital events/education so far, as well as details about her and her family's upcoming schedules. Her speech and language appear to be [facility]. SLP reviewed results with pt but given that she believes that all errors were within her baseline level of function, she politely declines SLP f/u at this time. SLP to sign off acutely.    SLP Assessment  SLP Recommendation/Assessment: Patient does not need any further Speech Lanaguage Pathology Services SLP Visit Diagnosis: Cognitive communication deficit (R41.841)    Follow Up Recommendations   (supervision upon initial return home)    Frequency and Duration           SLP Evaluation Cognition  Overall Cognitive Status: History of cognitive impairments - at baseline Orientation Level: Oriented X4       Comprehension  Auditory Comprehension Overall Auditory Comprehension: Appears within functional limits for tasks assessed    Expression Expression Primary Mode of Expression: Verbal Verbal Expression Overall Verbal Expression: Appears within functional limits for tasks assessed   Oral / Motor  Motor Speech Overall Motor Speech: Appears within functional limits for tasks assessed   GO                    Osie Bond., M.A. [place] [facility] [facility] Pager [phone] Office [phone]  09/02/2020, 4:12 PM

## 2020-09-03 ENCOUNTER — Other Ambulatory Visit (HOSPITAL_COMMUNITY): Payer: Medicare Other

## 2020-09-03 LAB — GLUCOSE, CAPILLARY
Glucose-Capillary: 183 mg/dL — ABNORMAL HIGH (ref 70–99)
Glucose-Capillary: 222 mg/dL — ABNORMAL HIGH (ref 70–99)
Glucose-Capillary: 230 mg/dL — ABNORMAL HIGH (ref 70–99)
Glucose-Capillary: 200 mg/dL — ABNORMAL HIGH (ref 70–99)

## 2020-09-03 MED ORDER — INSULIN NPH (HUMAN) (ISOPHANE) 100 UNIT/ML ~~LOC~~ SUSP
5.0000 [IU] | Freq: Two times a day (BID) | SUBCUTANEOUS | Status: DC
  Administered 2020-09-03 – 2020-09-06 (×6): 5 [IU] via SUBCUTANEOUS
  Filled 2020-09-03 (×2): qty 10

## 2020-09-03 MED ORDER — WHITE PETROLATUM EX OINT
TOPICAL_OINTMENT | CUTANEOUS | Status: AC
  Filled 2020-09-03: qty 28.35

## 2020-09-03 MED ORDER — PANTOPRAZOLE SODIUM 40 MG PO TBEC
40.0000 mg | DELAYED_RELEASE_TABLET | Freq: Every day | ORAL | Status: DC
  Administered 2020-09-03 – 2020-09-05 (×3): 40 mg via ORAL
  Filled 2020-09-03 (×3): qty 1

## 2020-09-03 MED ORDER — GLIPIZIDE 5 MG PO TABS
10.0000 mg | ORAL_TABLET | Freq: Every day | ORAL | Status: DC
  Administered 2020-09-03 – 2020-09-06 (×4): 10 mg via ORAL
  Filled 2020-09-03: qty 2
  Filled 2020-09-03: qty 1
  Filled 2020-09-03: qty 2
  Filled 2020-09-03: qty 1
  Filled 2020-09-03: qty 2

## 2020-09-03 MED ORDER — CHLORHEXIDINE GLUCONATE CLOTH 2 % EX PADS
6.0000 | MEDICATED_PAD | Freq: Every day | CUTANEOUS | Status: DC
  Administered 2020-09-04: 6 via TOPICAL

## 2020-09-03 MED ORDER — ZOLPIDEM TARTRATE 5 MG PO TABS: 5.0000 mg | ORAL_TABLET | Freq: Every evening | ORAL | Status: DC | PRN

## 2020-09-03 MED ORDER — AMLODIPINE BESYLATE 5 MG PO TABS
5.0000 mg | ORAL_TABLET | Freq: Every day | ORAL | Status: DC
  Administered 2020-09-03 – 2020-09-06 (×4): 5 mg via ORAL
  Filled 2020-09-03 (×4): qty 1

## 2020-09-03 MED ORDER — TIZANIDINE HCL 4 MG PO TABS
4.0000 mg | ORAL_TABLET | ORAL | Status: DC | PRN
  Administered 2020-09-03 – 2020-09-06 (×7): 4 mg via ORAL
  Filled 2020-09-03 (×8): qty 1

## 2020-09-03 MED ORDER — INSULIN NPH (HUMAN) (ISOPHANE) 100 UNIT/ML ~~LOC~~ SUSP
5.0000 [IU] | Freq: Two times a day (BID) | SUBCUTANEOUS | Status: DC
  Filled 2020-09-03: qty 10

## 2020-09-03 NOTE — Evaluation (Addendum)
Physical Therapy Evaluation Patient Details Name: Natalie [name] MRN: [id number] DOB: 26-Apr-1951 Today's Date: 09/03/2020   History of Present Illness  The pt is a 69 yo female presenting with L-sided facial droop and weakness. 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 PMH includes: chronic headaches, HTN, HLD, DM II, CKD III, recurrent UTIs, and seizures.   Clinical Impression  Pt in bed upon arrival of PT, agreeable to evaluation at this time. Prior to admission the pt was completely independent with mobility and ADLs, living at home with a spouse who works during the day and a daughter who was a Audiological scientist. The pt now presents with limitations in functional mobility, strength, power, coordination, activity tolerance, and dynamic stability due to above dx, and will continue to benefit from skilled PT to address these deficits. The pt was able to demo good tolerance for initial bed mobility and transfers, but requires minA of 2 for stability and to assist due to deficits in LLE strength during standing, transfers, and gait. The pt will continue to benefit from skilled PT acutely and following d/c to address deficits and allow for return to prior level of function and independence.      Follow Up Recommendations Home health PT;Supervision/Assistance - 24 hour    Equipment Recommendations  3in1 (PT)    Recommendations for Other Services       Precautions / Restrictions Precautions Precautions: Fall Restrictions Weight Bearing Restrictions: No Other Position/Activity Restrictions: SBP <140      Mobility  Bed Mobility Overal bed mobility: Needs Assistance Bed Mobility: Supine to Sit     Supine to sit: Supervision     General bed mobility comments: use of bed rail, some assist for line management    Transfers Overall transfer level: Needs assistance Equipment used: 2 person hand held assist Transfers: Sit to/from Stand;Stand Pivot  Transfers Sit to Stand: Min assist;+2 physical assistance Stand pivot transfers: Min assist;+2 physical assistance       General transfer comment: minA to steady, L knee buckling initially, but pt able to stand without blocking of L knee on 2nd attempt. minA to lateral steps to recliner for stability  Ambulation/Gait Ambulation/Gait assistance: Min assist;+2 physical assistance Gait Distance (Feet): 7 Feet Assistive device: 2 person hand held assist Gait Pattern/deviations: Step-to pattern;Decreased stride length Gait velocity: decreased Gait velocity interpretation: <1.31 ft/sec, indicative of household ambulator General Gait Details: small forward steps with chair follow. increased assist to steady with stance on LLE, but pt able to advance BLE without assist   Modified Rankin (Stroke Patients Only) Modified Rankin (Stroke Patients Only) Pre-Morbid Rankin Score: No significant disability Modified Rankin: Moderately severe disability     Balance Overall balance assessment: Needs assistance Sitting-balance support: No upper extremity supported Sitting balance-Leahy Scale: Fair     Standing balance support: Bilateral upper extremity supported;During functional activity Standing balance-Leahy Scale: Poor Standing balance comment: relies on BUE support                             Pertinent Vitals/Pain Pain Assessment: Faces Faces Pain Scale: Hurts a little bit Pain Location: headache Pain Descriptors / Indicators: Headache Pain Intervention(s): Limited activity within patient's tolerance;Monitored during session;Repositioned    Home Living Family/patient expects to be discharged to:: Private residence Living Arrangements: Spouse/significant other;Children Available Help at Discharge: Family Type of Home: House Home Access: Stairs to enter Entrance Stairs-Rails: None Entrance  Stairs-Number of Steps: 4 (previously had ramp, spouse states he can reinstall ramp  if needed) Home Layout: One level Home Equipment: Walker - 2 wheels;Walker - 4 wheels;Cane - single point;Grab bars - tub/shower;Grab bars - toilet      Prior Function Level of Independence: Independent         Comments: regained independence following injury last year requiring use of AD     Hand Dominance   Dominant Hand: Right    Extremity/Trunk Assessment   Upper Extremity Assessment Upper Extremity Assessment: LUE deficits/detail;Generalized weakness LUE Deficits / Details: slightly decreased grip strength, reports decreased sensation on fingers/palm of hand LUE Sensation: decreased light touch LUE Coordination: decreased fine motor    Lower Extremity Assessment Lower Extremity Assessment: Generalized weakness (pt denies difference in sensation)    Cervical / Trunk Assessment Cervical / Trunk Assessment: Kyphotic  Communication   Communication: No difficulties  Cognition Arousal/Alertness: Awake/alert Behavior During Therapy: WFL for tasks assessed/performed Overall Cognitive Status: Within Functional Limits for tasks assessed                                 General Comments: pt agreeable to all education, ablt to answer all questions about home/prior level of function at eval with husband present to confirm answers      General Comments General comments (skin integrity, edema, etc.): BP 139/60 upon arrival PT with pt in supine. BP 159/66 sitting EOB, 130/73 standing, and 122/67 sitting in recliner after session    Exercises     Assessment/Plan    PT Assessment Patient needs continued PT services  PT Problem List Decreased strength;Decreased range of motion;Decreased activity tolerance;Decreased balance;Decreased mobility;Decreased coordination;Decreased cognition       PT Treatment Interventions DME instruction;Gait training;Functional mobility training;Stair training;Therapeutic activities;Balance training;Therapeutic exercise;Neuromuscular  re-education;Patient/family education    PT Goals (Current goals can be found in the Care Plan section)  Acute Rehab PT Goals Patient Stated Goal: return home PT Goal Formulation: With patient Time For Goal Achievement: 09/17/20 Potential to Achieve Goals: Good    Frequency Min 4X/week    AM-PAC PT "6 Clicks" Mobility  Outcome Measure Help needed turning from your back to your side while in a flat bed without using bedrails?: A Little Help needed moving from lying on your back to sitting on the side of a flat bed without using bedrails?: A Little Help needed moving to and from a bed to a chair (including a wheelchair)?: A Little Help needed standing up from a chair using your arms (e.g., wheelchair or bedside chair)?: A Little Help needed to walk in hospital room?: A Little Help needed climbing 3-5 steps with a railing? : A Lot 6 Click Score: 17    End of Session Equipment Utilized During Treatment: Gait belt Activity Tolerance: Patient tolerated treatment well Patient left: in chair;with call bell/phone within reach;with chair alarm set;with family/visitor present Nurse Communication: Mobility status PT Visit Diagnosis: Other abnormalities of gait and mobility (R26.89)    Time: 1340-1407 PT Time Calculation (min) (ACUTE ONLY): 27 min   Charges:   PT Evaluation $PT Eval Moderate Complexity: 1 Mod PT Treatments $Gait Training: 8-22 mins        Karma Ganja, PT, DPT   Acute Rehabilitation Department Pager #: [phone]  Otho Bellows 09/03/2020, 3:54 PM

## 2020-09-03 NOTE — Progress Notes (Addendum)
STROKE TEAM PROGRESS NOTE   HISTORY OF PRESENT ILLNESS (per record) Natalie [name] is a 69 y.o. female past medical history of chronic headaches chronic migraine, chronic pain syndrome, hypertension, hyperlipidemia, type 2 diabetes, seizures since the age of 52, who presented to the emergency room for evaluation after a near fall where her left leg gave out.  She says that she has numbness in both lower extremities for a long time but she felt that her left leg was weaker and gave out yesterday. Last known normal was somewhere around 8 or 9 PM yesterday. She also was noted to have left-sided facial droop by family yesterday but that has since resolved.  Speech might also be a little slurred although family says it is normal. Reports a mild headache.  Denies any visual changes.  Denies any other recent illnesses or sicknesses.  Denies any sick contacts.  Denies chest pain shortness of breath nausea vomiting. Noncontrast head CT completed in the emergency room showed 2 cc acute ICH in the right corona radiata.  Early extension into the right lateral ventricle. Neurology was consulted for admission Patient denies any preceding seizures. LKW: Before going to bed at 9 PM yesterday 09/01/2020. tpa given?: no, ICH Premorbid modified Rankin scale (mRS): 0. ICH score-1 - IVH  INTERVAL HISTORY Her RN and husband are at the bedside.   She is sitting up in bed. Blood pressure adequately controlled. She feels left-sided weakness is improved though she still has some numbness. MRI scan of the brain shows stable appearance of the right basal ganglia hemorrhage with mild cytotoxic edema but no intraventricular extension, hydrocephalus or midline shift. CT scan done last night also was stable. LDL cholesterol is 69 mg percent and hemoglobin A1c is elevated 8.1. Echocardiogram is pending.    OBJECTIVE Vitals:   09/03/20 0300 09/03/20 0400 09/03/20 0500 09/03/20 0600  BP: (!) 132/57 (!) 125/56 (!) 128/55 (!) 123/59   Pulse: 77 76 73 75  Resp: (!) 9 10 11 11   Temp:  98.4 F (36.9 C)    TempSrc:  Oral    SpO2: 95% 95% 96% 95%  Weight:      Height:        CBC:  Recent Labs  Lab 09/02/20 1027 09/02/20 1029  WBC  --  8.4  NEUTROABS  --  6.2  HGB 12.2 11.4*  HCT 36.0 37.0  MCV  --  94.4  PLT  --  833    Basic Metabolic Panel:  Recent Labs  Lab 09/02/20 1027 09/02/20 1029  NA 141 140  K 3.5 3.5  CL 108 104  CO2  --  26  GLUCOSE 175* 178*  BUN 19 15  CREATININE 1.00 1.11*  CALCIUM  --  9.7    Lipid Panel:     Component Value Date/Time   CHOL 154 09/02/2020 1029   CHOL 159 10/08/2018 1504   TRIG 107 09/02/2020 1029   HDL 64 09/02/2020 1029   HDL 75 10/08/2018 1504   CHOLHDL 2.4 09/02/2020 1029   VLDL 21 09/02/2020 1029   LDLCALC 69 09/02/2020 1029   LDLCALC 65 10/08/2018 1504   HgbA1c:  Lab Results  Component Value Date   HGBA1C 8.1 (H) 09/02/2020   Urine Drug Screen: No results found for: LABOPIA, COCAINSCRNUR, LABBENZ, AMPHETMU, THCU, LABBARB  Alcohol Level     Component Value Date/Time   ETH <10 09/02/2020 1029    IMAGING  CT HEAD WO CONTRAST 09/02/2020 IMPRESSION:  Unchanged appearance  of right basal ganglia intraparenchymal hemorrhage.   CT HEAD WO CONTRAST 09/02/2020 IMPRESSION:  1. 2 cc acute hematoma in the right corona radiata. There is early extension into the right lateral ventricle.  2. Background chronic small vessel disease.   CT CERVICAL SPINE WO CONTRAST 09/02/2020 IMPRESSION:  No acute finding in the cervical spine.   MR BRAIN W WO CONTRAST 09/02/2020 IMPRESSION:  Acute parenchymal hemorrhage centered in the right corona radiata with mild surrounding edema and trace intraventricular extension. No evidence of underlying lesion. Few additional chronic microhemorrhages likely reflecting sequelae of hypertension. Mild to moderate chronic microvascular ischemic changes. Chronic small vessel infarcts.   DG Shoulder Left 09/02/2020 IMPRESSION:   Mildly limited study with no evidence of fracture or dislocation.   Transthoracic Echocardiogram  00/00/2021 Pending  ECG - SR rate 89 BPM. (See cardiology reading for complete details)  PHYSICAL EXAM Blood pressure (!) 123/59, pulse 75, temperature 98.4 F (36.9 C), temperature source Oral, resp. rate 11, height 4\' 8"  (1.422 m), weight 77.6 kg, SpO2 95 %. Pleasant obese elderly lady not in distress. . Afebrile. Head is nontraumatic. Neck is supple without bruit.    Cardiac exam no murmur or gallop. Lungs are clear to auscultation. Distal pulses are well felt. Neurological exam Awake alert oriented to time place and person. Diminished attention, registration and recall. Follows simple midline and one-step commands well. No dysarthria or aphasia. Extraocular movements are full range without nystagmus. Blinks to threat bilaterally. Mild left lower facial asymmetry. Tongue midline. Motor system exam mild left hemiparesis 4+/5 strength with very subtle left drift. Fine finger movements are diminished on the left. Orbits right over left upper extremity. Left grip slightly weak compared to the right. Sensation appears intact. Gait not tested.   ASSESSMENT/PLAN Natalie [name] is a 69 y.o. female with history of chronic headaches chronic migraine, chronic numbness BLE, chronic pain syndrome, CKD, hypertension, hyperlipidemia, type 2 diabetes, and seizures since the age of 61, presenting with left sided weakness and mild headache. She did not receive IV t-PA due to [place].  ICH: acute parenchymal hemorrhage centered in the right corona radiata with mild surrounding edema and trace intraventricular extension-etiology likely hypertensive  Resultant mild left hemiparesis resolving  Code Stroke CT Head - not ordered  CT head -  2 cc acute hematoma in the right corona radiata. There is early extension into the right lateral ventricle. Background chronic small vessel disease.   CT head repeat -  Unchanged appearance of right basal ganglia intraparenchymal hemorrhage.   MRI head - Acute parenchymal hemorrhage centered in the right corona radiata with mild surrounding edema and trace intraventricular extension. No evidence of underlying lesion. Few additional chronic microhemorrhages likely reflecting sequelae of hypertension. Mild to moderate chronic microvascular ischemic changes. Chronic small vessel infarcts  MRA head - not ordered  CTA H&N -pending  CT Perfusion - not ordered  Carotid Doppler - not ordered  2D Echo - pending  Sars Corona Virus 2 - negative  LDL - 69  HgbA1c - 8.1  UDS - pending  VTE prophylaxis - SCDs Diet  Diet Order            Diet heart healthy/carb modified Room service appropriate? Yes; Fluid consistency: Thin  Diet effective now                 No antithrombotic prior to admission, now on No antithrombotic ICH  Ongoing aggressive stroke risk factor management  Therapy recommendations:  pending  Disposition:  Pending  Hypertension  Home BP meds: Norvasc ; Benicar  Current BP meds: Cleviprex prn  Stable . SBP goal < 140 mm Hg initially . Long-term BP goal normotensive  Hyperlipidemia  Home Lipid lowering medication:  Lipitor 40 mg daily   LDL 69, goal < 70  Current lipid lowering medication: Lipitor 40 mg daily - will D/C  Continue statin at discharge  Diabetes  Home diabetic meds: insulin ; Glipizide  Current diabetic meds: insulin  HgbA1c 8.1, goal < 7.0 Recent Labs    09/02/20 1706 09/02/20 2212 09/03/20 0750  GLUCAP 146* 266* 183*    Other Stroke Risk Factors  Advanced age  Obesity, Body mass index is 38.35 kg/m., recommend weight loss, diet and exercise as appropriate   Migraines  Previous strokes by imaging  Other Active Problems Code status - Full code CKD - stage 3a - creatinine - 1.11  Seizure hx - phenobarbital (phenobarbital level 27.1)  Hospital day # 1 Plan maintain strict blood  pressure control with systolic goal below 076 for 24 hours and then below 160. Use as needed IV labetalol and hydralazine and resume home medications. Mobilize out of bed. Physical occupational and speech therapy consults. Continue phenobarbital for history of seizures. Long discussion with patient and husband at the bedside and answered questions. Check echocardiogram results. CT angiogram of the brain and neck in the morning. This patient is critically ill and at significant risk of neurological worsening, death and care requires constant monitoring of vital signs, hemodynamics,respiratory and cardiac monitoring, extensive review of multiple databases, frequent neurological assessment, discussion with family, other specialists and medical decision making of high complexity.I have made any additions or clarifications directly to the above note.This critical care time does not reflect procedure time, or teaching time or supervisory time of PA/NP/Med Resident etc but could involve care discussion time.  I spent 35 minutes of neurocritical care time  in the care of  this patient. Patient was on statin prior to admission and has intracerebral hemorrhage may consider possible participation in the [employer] clinical trial of restarting statin after intracerebral hemorrhage. Have discussed with patient and husband and they have expressed interest.   Antony Contras, MD  To contact Stroke Continuity provider, please refer to [email or web address] After hours, contact General Neurology

## 2020-09-04 ENCOUNTER — Inpatient Hospital Stay (HOSPITAL_COMMUNITY): Payer: Medicare Other

## 2020-09-04 DIAGNOSIS — I6389 Other cerebral infarction: Secondary | ICD-10-CM | POA: Diagnosis not present

## 2020-09-04 LAB — GLUCOSE, CAPILLARY
Glucose-Capillary: 207 mg/dL — ABNORMAL HIGH (ref 70–99)
Glucose-Capillary: 338 mg/dL — ABNORMAL HIGH (ref 70–99)
Glucose-Capillary: 186 mg/dL — ABNORMAL HIGH (ref 70–99)
Glucose-Capillary: 151 mg/dL — ABNORMAL HIGH (ref 70–99)
Glucose-Capillary: 198 mg/dL — ABNORMAL HIGH (ref 70–99)

## 2020-09-04 LAB — ECHOCARDIOGRAM COMPLETE
Area-P 1/2: 3.08 cm2
Height: 56 in
Weight: 2737.23 oz

## 2020-09-04 MED ORDER — PERFLUTREN LIPID MICROSPHERE
1.0000 mL | INTRAVENOUS | Status: AC | PRN
  Administered 2020-09-04: 2 mL via INTRAVENOUS
  Filled 2020-09-04: qty 10

## 2020-09-04 MED ORDER — IOHEXOL 350 MG/ML SOLN
75.0000 mL | Freq: Once | INTRAVENOUS | Status: AC | PRN
  Administered 2020-09-04: 75 mL via INTRAVENOUS

## 2020-09-04 NOTE — Evaluation (Signed)
Occupational Therapy Evaluation Patient Details Name: Natalie [name] MRN: [id number] DOB: May 12, 1951 Today's Date: 09/04/2020    History of Present Illness The pt is a 69 yo female presenting with L-sided facial droop and weakness. 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 PMH includes: chronic headaches, HTN, HLD, DM II, CKD III, recurrent UTIs, and seizures.    Clinical Impression   This 69 y/o female presents with the above. PTA pt reports being independent with ADL, iADL and functional mobility. Pt currently presenting with deficits including L side weakness, decreased standing balance and mobility status impacting her overall functional performance. Pt requiring up to [place] for stand pivot transfers to [facility] and for taking few steps to recliner, having difficulty stepping LLE during transitions today. She requires at least modA for LB and toileting ADL. VSS throughout on RA. Pt reports has good family support at home and spouse likely can take off from work initially to assist PRN. She will benefit from continued acute OT services, given current deficits feel she is a good candidate for CIR level therapies to maximize her overall safety and independence with ADL and mobility prior to returning home.     Follow Up Recommendations  CIR    Equipment Recommendations  3 in 1 bedside commode;Other (comment) (TBD)    Recommendations for Other Services Rehab consult     Precautions / Restrictions Precautions Precautions: Fall Restrictions Weight Bearing Restrictions: No Other Position/Activity Restrictions: SBP <140      Mobility Bed Mobility Overal bed mobility: Needs Assistance Bed Mobility: Supine to Sit     Supine to sit: Min guard     General bed mobility comments: for balance, lines and safety    Transfers Overall transfer level: Needs assistance Equipment used: 1 person hand held assist Transfers: Sit to/from [employer] Sit to Stand: Min assist Stand pivot transfers: Mod assist       General transfer comment: no noted buckling in LLE however pt having difficulty with stepping LLE, somewhat dragging LE during transitions, heavy reliance on UE support reaching for arms of BSC start of transfer; stand pivot to [facility] and then few steps to recliner     Balance Overall balance assessment: Needs assistance Sitting-balance support: No upper extremity supported;Feet unsupported Sitting balance-Leahy Scale: Fair     Standing balance support: Bilateral upper extremity supported;During functional activity Standing balance-Leahy Scale: Poor Standing balance comment: reliant on UE support/external assist                            ADL either performed or assessed with clinical judgement   ADL Overall ADL's : Needs assistance/impaired Eating/Feeding: Set up;Sitting   Grooming: Wash/dry hands;Set up;Sitting   Upper Body Bathing: Min guard;Sitting   Lower Body Bathing: Moderate assistance;Sit to/from stand   Upper Body Dressing : Min guard;Sitting   Lower Body Dressing: Moderate assistance;Sit to/from stand   Toilet Transfer: Moderate assistance;Stand-pivot;BSC   Toileting- Clothing Manipulation and Hygiene: Moderate assistance;Sit to/from stand Toileting - Clothing Manipulation Details (indicate cue type and reason): assist for balance in standing and gown management while pt completing anterior pericare      Functional mobility during ADLs: Moderate assistance (stand pivot, few steps BSC to recliner )                           Pertinent Vitals/Pain Pain Assessment:  Faces Faces Pain Scale: Hurts a little bit Pain Location: generalized muscle aching (previous ankle repair, feels she may have slept wrong on her neck) Pain Descriptors / Indicators: Aching Pain Intervention(s): Monitored during session;Patient requesting pain meds-RN notified (RN giving muscle relaxer during  session)     Hand Dominance Right   Extremity/Trunk Assessment Upper Extremity Assessment Upper Extremity Assessment: LUE deficits/detail LUE Deficits / Details: grossly 3/5, fair grip strength, decreased sensation (most prominant in digits/hand) LUE Sensation: decreased light touch LUE Coordination: decreased fine motor   Lower Extremity Assessment Lower Extremity Assessment: Defer to PT evaluation   Cervical / Trunk Assessment Cervical / Trunk Assessment: Kyphotic   Communication Communication Communication: No difficulties   Cognition Arousal/Alertness: Awake/alert Behavior During Therapy: WFL for tasks assessed/performed Overall Cognitive Status: Within Functional Limits for tasks assessed                                 General Comments: not formally assessed but seems [facility] for basic tasks    General Comments  VSS on RA    Exercises     Shoulder Instructions      Home Living Family/patient expects to be discharged to:: Private residence Living Arrangements: Spouse/significant other;Children Available Help at Discharge: Family;Other (Comment) (reports spouse can likely take off work) Type of Home: House Home Access: Stairs to enter [employer] of Steps: 4 (previously had ramp, spouse states he can reinstall ramp if needed) Entrance Stairs-Rails: None Home Layout: One level     Bathroom Shower/Tub: Occupational psychologist: Standard     Home Equipment: Environmental consultant - 2 wheels;Walker - 4 wheels;Cane - single point;Grab bars - tub/shower;Grab bars - toilet      Lives With: Family (daughers are paramedics)    Prior Functioning/Environment Level of Independence: Independent        Comments: regained independence following injury last year requiring use of AD        OT Problem List: Decreased strength;Decreased range of motion;Decreased activity tolerance;Impaired balance (sitting and/or standing);Decreased cognition;Decreased  safety awareness;Decreased knowledge of use of DME or AE;Obesity;Impaired UE functional use;Impaired sensation      OT Treatment/Interventions: Self-care/ADL training;Therapeutic exercise;Energy conservation;DME and/or AE instruction;Therapeutic activities;Cognitive remediation/compensation;Patient/family education;Balance training    OT Goals(Current goals can be found in the care plan section) Acute Rehab OT Goals Patient Stated Goal: return home OT Goal Formulation: With patient Time For Goal Achievement: 09/18/20 Potential to Achieve Goals: Good  OT Frequency: Min 2X/week   Barriers to D/C:            Co-evaluation              AM-PAC OT "6 Clicks" Daily Activity     Outcome Measure Help from another person eating meals?: A Little Help from another person taking care of personal grooming?: A Little Help from another person toileting, which includes using toliet, bedpan, or urinal?: A Lot Help from another person bathing (including washing, rinsing, drying)?: A Lot Help from another person to put on and taking off regular upper body clothing?: A Little Help from another person to put on and taking off regular lower body clothing?: A Lot 6 Click Score: 15   End of Session Equipment Utilized During Treatment: Gait belt Nurse Communication: Mobility status  Activity Tolerance: Patient tolerated treatment well Patient left: in chair;with call bell/phone within reach;with chair alarm set  OT Visit Diagnosis: Other abnormalities of gait  and mobility (R26.89);Hemiplegia and hemiparesis;Other symptoms and signs involving the nervous system (R29.898) Hemiplegia - Right/Left: Left Hemiplegia - dominant/non-dominant: Non-Dominant Hemiplegia - caused by: Nontraumatic intracerebral hemorrhage                Time: 0912-0934 OT Time Calculation (min): 22 min Charges:  OT General Charges $OT Visit: 1 Visit OT Evaluation $OT Eval Moderate Complexity: 1 Mod  Lou Cal,  OT Acute Rehabilitation Services Pager [phone] Office [phone]   Raymondo Band 09/04/2020, 9:52 AM

## 2020-09-04 NOTE — Progress Notes (Signed)
  Echocardiogram 2D Echocardiogram with definity has been performed.  [name] M 09/04/2020, 9:23 AM

## 2020-09-04 NOTE — Progress Notes (Signed)
STROKE TEAM PROGRESS NOTE     INTERVAL HISTORY Her daughter is at the bedside.   She is sitting up in bed. Blood pressure adequately controlled. She feels left-sided weakness is continuing to improve  though she still has some numbness.  CT angiogram of the brain and neck shows no large vessel stenosis or occlusion or aneurysm or AV malformation.  Right posterior basal ganglia hemorrhage is unchanged.. Echocardiogram is pending.  Vital signs are stable.  Blood pressure adequately controlled.  Physical occupational therapy evaluated her and recommend home therapies.    OBJECTIVE Vitals:   09/04/20 0200 09/04/20 0300 09/04/20 0400 09/04/20 0442  BP:    (!) 156/77  Pulse: 85 85 85 84  Resp: 11 12 10 12   Temp:      TempSrc:      SpO2: 97% 97% 97% 99%  Weight:      Height:        CBC:  Recent Labs  Lab 09/02/20 1027 09/02/20 1029  WBC  --  8.4  NEUTROABS  --  6.2  HGB 12.2 11.4*  HCT 36.0 37.0  MCV  --  94.4  PLT  --  409    Basic Metabolic Panel:  Recent Labs  Lab 09/02/20 1027 09/02/20 1029  NA 141 140  K 3.5 3.5  CL 108 104  CO2  --  26  GLUCOSE 175* 178*  BUN 19 15  CREATININE 1.00 1.11*  CALCIUM  --  9.7    Lipid Panel:     Component Value Date/Time   CHOL 154 09/02/2020 1029   CHOL 159 10/08/2018 1504   TRIG 107 09/02/2020 1029   HDL 64 09/02/2020 1029   HDL 75 10/08/2018 1504   CHOLHDL 2.4 09/02/2020 1029   VLDL 21 09/02/2020 1029   LDLCALC 69 09/02/2020 1029   LDLCALC 65 10/08/2018 1504   HgbA1c:  Lab Results  Component Value Date   HGBA1C 8.1 (H) 09/02/2020   Urine Drug Screen: No results found for: LABOPIA, COCAINSCRNUR, LABBENZ, AMPHETMU, THCU, LABBARB  Alcohol Level     Component Value Date/Time   ETH <10 09/02/2020 1029    IMAGING  CT HEAD WO CONTRAST 09/02/2020 IMPRESSION:  Unchanged appearance of right basal ganglia intraparenchymal hemorrhage.   CT HEAD WO CONTRAST 09/02/2020 IMPRESSION:  1. 2 cc acute hematoma in the right  corona radiata. There is early extension into the right lateral ventricle.  2. Background chronic small vessel disease.   CT CERVICAL SPINE WO CONTRAST 09/02/2020 IMPRESSION:  No acute finding in the cervical spine.   MR BRAIN W WO CONTRAST 09/02/2020 IMPRESSION:  Acute parenchymal hemorrhage centered in the right corona radiata with mild surrounding edema and trace intraventricular extension. No evidence of underlying lesion. Few additional chronic microhemorrhages likely reflecting sequelae of hypertension. Mild to moderate chronic microvascular ischemic changes. Chronic small vessel infarcts.   DG Shoulder Left 09/02/2020 IMPRESSION:  Mildly limited study with no evidence of fracture or dislocation.   Transthoracic Echocardiogram  00/00/2021 Pending  ECG - SR rate 89 BPM. (See cardiology reading for complete details)  PHYSICAL EXAM Blood pressure (!) 156/77, pulse 84, temperature 97.8 F (36.6 C), temperature source Oral, resp. rate 12, height 4\' 8"  (1.422 m), weight 77.6 kg, SpO2 99 %. Pleasant obese elderly lady not in distress. . Afebrile. Head is nontraumatic. Neck is supple without bruit.    Cardiac exam no murmur or gallop. Lungs are clear to auscultation. Distal pulses are well felt. Neurological exam Awake  alert oriented to time place and person. Diminished attention, registration and recall. Follows simple midline and one-step commands well. No dysarthria or aphasia. Extraocular movements are full range without nystagmus. Blinks to threat bilaterally. Mild left lower facial asymmetry. Tongue midline. Motor system exam mild left hemiparesis 4+/5 strength with very subtle left drift. Fine finger movements are diminished on the left. Orbits right over left upper extremity. Left grip slightly weak compared to the right. Sensation appears intact. Gait not tested.   ASSESSMENT/PLAN Ms. JAI BEAR is a 69 y.o. female with history of chronic headaches chronic migraine, chronic  numbness BLE, chronic pain syndrome, CKD, hypertension, hyperlipidemia, type 2 diabetes, and seizures since the age of 3, presenting with left sided weakness and mild headache. She did not receive IV t-PA due to [place].  ICH: acute parenchymal hemorrhage centered in the right corona radiata with mild surrounding edema and trace intraventricular extension-etiology likely hypertensive  Resultant mild left hemiparesis resolving  Code Stroke CT Head - not ordered  CT head -  2 cc acute hematoma in the right corona radiata. There is early extension into the right lateral ventricle. Background chronic small vessel disease.   CT head repeat - Unchanged appearance of right basal ganglia intraparenchymal hemorrhage.   MRI head - Acute parenchymal hemorrhage centered in the right corona radiata with mild surrounding edema and trace intraventricular extension. No evidence of underlying lesion. Few additional chronic microhemorrhages likely reflecting sequelae of hypertension. Mild to moderate chronic microvascular ischemic changes. Chronic small vessel infarcts  MRA head - not ordered  CTA H&N -no large vessel stenosis or occlusion no aneurysm or AVM.  CT Perfusion - not ordered  Carotid Doppler - not ordered  2D Echo - pending  [place] Virus 2 - negative  LDL - 69  HgbA1c - 8.1  UDS - pending  VTE prophylaxis - SCDs Diet  Diet Order            Diet heart healthy/carb modified Room service appropriate? Yes; Fluid consistency: Thin  Diet effective now                 No antithrombotic prior to admission, now on No antithrombotic ICH  Ongoing aggressive stroke risk factor management  Therapy recommendations:  pending  Disposition:  Pending  Hypertension  Home BP meds: Norvasc ; Benicar  Current BP meds: Cleviprex prn  Stable . SBP goal < 140 mm Hg initially . Long-term BP goal normotensive  Hyperlipidemia  Home Lipid lowering medication:  Lipitor 40 mg daily    LDL 69, goal < 70  Current lipid lowering medication: Lipitor 40 mg daily - will D/C  Continue statin at discharge  Diabetes  Home diabetic meds: insulin ; Glipizide  Current diabetic meds: insulin  HgbA1c 8.1, goal < 7.0 Recent Labs    09/03/20 1208 09/03/20 1655 09/03/20 2212  GLUCAP 222* 230* 200*    Other Stroke Risk Factors  Advanced age  Obesity, Body mass index is 38.35 kg/m., recommend weight loss, diet and exercise as appropriate   Migraines  Previous strokes by imaging  Other Active Problems Code status - Full code CKD - stage 3a - creatinine - 1.11  Seizure hx - phenobarbital (phenobarbital level 27.1)  Hospital day # 2 Plan maintain strict blood pressure control with systolic goal  below 401. Use as needed IV labetalol and hydralazine and resume home medications. Mobilize out of bed. Physical occupational and speech therapy consults. Continue phenobarbital for history of seizures.  Long discussion with patient , daughter and husband at the bedside and answered questions. Check echocardiogram results.Patient was on statin prior to admission and has intracerebral hemorrhage may consider possible participation in the [place] clinical trial of restarting statin after intracerebral hemorrhage. Have discussed with patient and husband and they have expressed interest. Likely discharge home in the morning with home therapy needs.  Greater than 50% time during the 25-minute visit was spent in counseling and coordination of care discussion with care team and family and answering questions. Antony Contras, MD   To contact Stroke Continuity provider, please refer to [email or web address] After hours, contact General Neurology

## 2020-09-04 NOTE — Progress Notes (Signed)
OT Cancellation Note  Patient Details Name: Natalie [name] MRN: [id number] DOB: May 10, 1951   Cancelled Treatment:    Reason Eval/Treat Not Completed: Patient at procedure or test/ unavailable (ECHO), will follow up for OT eval as able.  Lou Cal, OT Acute Rehabilitation Services Pager [phone] Office [phone]    Raymondo Band 09/04/2020, 9:00 AM

## 2020-09-05 DIAGNOSIS — I610 Nontraumatic intracerebral hemorrhage in hemisphere, subcortical: Secondary | ICD-10-CM

## 2020-09-05 LAB — GLUCOSE, CAPILLARY
Glucose-Capillary: 178 mg/dL — ABNORMAL HIGH (ref 70–99)
Glucose-Capillary: 325 mg/dL — ABNORMAL HIGH (ref 70–99)
Glucose-Capillary: 204 mg/dL — ABNORMAL HIGH (ref 70–99)
Glucose-Capillary: 231 mg/dL — ABNORMAL HIGH (ref 70–99)
Glucose-Capillary: 158 mg/dL — ABNORMAL HIGH (ref 70–99)

## 2020-09-05 NOTE — Progress Notes (Signed)
STROKE TEAM PROGRESS NOTE    INTERVAL HISTORY Patient is sitting in bed comfortably.  She states she is doing well she has no complaints.  She is about to work with physical therapist.  Her husband is at the bedside.  Vital signs are stable.  Neurological exam is unchanged.  OBJECTIVE Vitals:   09/04/20 2016 09/04/20 2315 09/05/20 0350 09/05/20 0933  BP: (!) 159/78 (!) 156/80 128/76 (!) 154/64  Pulse: 83 89 89   Resp: 13 14 12    Temp: 97.6 F (36.4 C) 98.8 F (37.1 C) 98.9 F (37.2 C)   TempSrc: Oral Oral Oral   SpO2: 96% (!) 87% 97%   Weight:      Height:       CBC:  Recent Labs  Lab 09/02/20 1027 09/02/20 1029  WBC  --  8.4  NEUTROABS  --  6.2  HGB 12.2 11.4*  HCT 36.0 37.0  MCV  --  94.4  PLT  --  272   Basic Metabolic Panel:  Recent Labs  Lab 09/02/20 1027 09/02/20 1029  NA 141 140  K 3.5 3.5  CL 108 104  CO2  --  26  GLUCOSE 175* 178*  BUN 19 15  CREATININE 1.00 1.11*  CALCIUM  --  9.7   Lipid Panel:     Component Value Date/Time   CHOL 154 09/02/2020 1029   CHOL 159 10/08/2018 1504   TRIG 107 09/02/2020 1029   HDL 64 09/02/2020 1029   HDL 75 10/08/2018 1504   CHOLHDL 2.4 09/02/2020 1029   VLDL 21 09/02/2020 1029   LDLCALC 69 09/02/2020 1029   LDLCALC 65 10/08/2018 1504   HgbA1c:  Lab Results  Component Value Date   HGBA1C 8.1 (H) 09/02/2020   Urine Drug Screen: No results found for: LABOPIA, COCAINSCRNUR, LABBENZ, AMPHETMU, THCU, LABBARB  Alcohol Level     Component Value Date/Time   ETH <10 09/02/2020 1029    IMAGING  CT HEAD WO CONTRAST 09/02/2020 IMPRESSION:  Unchanged appearance of right basal ganglia intraparenchymal hemorrhage.   CT HEAD WO CONTRAST 09/02/2020 IMPRESSION:  1. 2 cc acute hematoma in the right corona radiata. There is early extension into the right lateral ventricle.  2. Background chronic small vessel disease.   CT CERVICAL SPINE WO CONTRAST 09/02/2020 IMPRESSION:  No acute finding in the cervical spine.    MR BRAIN W WO CONTRAST 09/02/2020 IMPRESSION:  Acute parenchymal hemorrhage centered in the right corona radiata with mild surrounding edema and trace intraventricular extension. No evidence of underlying lesion. Few additional chronic microhemorrhages likely reflecting sequelae of hypertension. Mild to moderate chronic microvascular ischemic changes. Chronic small vessel infarcts.   DG Shoulder Left 09/02/2020 IMPRESSION:  Mildly limited study with no evidence of fracture or dislocation.   Transthoracic Echocardiogram  09/04/2020 1. Left ventricular ejection fraction, by estimation, is 60 to 65%. The left ventricle has normal function. Left ventricular endocardial border not optimally defined to evaluate regional wall motion. Left ventricular diastolic parameters are consistent with Grade I diastolic dysfunction (impaired relaxation).  2. Right ventricular systolic function is normal. The right ventricular size is normal. Tricuspid regurgitation signal is inadequate for assessing PA pressure.  3. The mitral valve is normal in structure. No evidence of mitral valve regurgitation. No evidence of mitral stenosis.  4. The aortic valve is grossly normal. Aortic valve regurgitation is not visualized. No aortic stenosis is present.  5. The inferior vena cava is normal in size with greater than 50% respiratory  variability, suggesting right atrial pressure of 3 mmHg.   ECG - SR rate 89 BPM. (See cardiology reading for complete details)  PHYSICAL EXAM    Blood pressure (!) 154/64, pulse 89, temperature 98.9 F (37.2 C), temperature source Oral, resp. rate 12, height 4\' 8"  (1.422 m), weight 77.6 kg, SpO2 97 %. Pleasant obese elderly lady not in distress. . Afebrile. Head is nontraumatic. Neck is supple without bruit.    Cardiac exam no murmur or gallop. Lungs are clear to auscultation. Distal pulses are well felt. Neurological exam Awake alert oriented to time place and person. Diminished  attention, registration and recall. Follows simple midline and one-step commands well. No dysarthria or aphasia. Extraocular movements are full range without nystagmus. Blinks to threat bilaterally. Mild left lower facial asymmetry. Tongue midline. Motor system exam mild left hemiparesis 4+/5 strength with very subtle left drift. Fine finger movements are diminished on the left. Orbits right over left upper extremity. Left grip slightly weak compared to the right. Sensation appears intact. Gait not tested.   ASSESSMENT/PLAN [name] is a 69 y.o. female with history of chronic headaches chronic migraine, chronic numbness BLE, chronic pain syndrome, CKD, hypertension, hyperlipidemia, type 2 diabetes, and seizures since the age of 20, presenting with left sided weakness and mild headache. She did not receive IV t-PA due to [place].  ICH: acute R corona radiata IPH with mild surrounding edema and trace IVH-etiology likely hypertensive  CT head -  2 cc acute hematoma in the right corona radiata. There is early extension into the right lateral ventricle. Background chronic small vessel disease.   CT head repeat - Unchanged appearance of right basal ganglia intraparenchymal hemorrhage.   MRI head - Acute parenchymal hemorrhage centered in the right corona radiata with mild surrounding edema and trace intraventricular extension. No evidence of underlying lesion. Few additional chronic microhemorrhages likely reflecting sequelae of hypertension. Mild to moderate chronic microvascular ischemic changes. Chronic small vessel infarcts  CTA H&N -no large vessel stenosis or occlusion no aneurysm or AVM.  2D Echo - EF 60-65%. No source of embolus    [employer] Virus 2 - negative  LDL - 69  HgbA1c - 8.1  UDS - pending (not collected)   VTE prophylaxis - SCDs  No antithrombotic prior to admission, now on No antithrombotic ICH  Therapy recommendations:  CIR - consult placed  Disposition:   Pending  Hypertension  Home BP meds: Norvasc ; Benicar  Current BP meds: norvasc 5  . SBP goal < 160  . Stable  . Long-term BP goal normotensive  Hyperlipidemia  Home Lipid lowering medication:  Lipitor 40 mg daily   LDL 69, goal < 70  Current lipid lowering medication: Lipitor 40 mg daily - will D/C given LDL less than goal in setting of hemorrhage   Continue half dose statin at discharge  Diabetes  Home diabetic meds: insulin ; Glipizide  Current diabetic meds: glipizide, SSI, NPH 5u bid  HgbA1c 8.1, goal < 7.0 Recent Labs    09/04/20 1810 09/04/20 2121 09/05/20 0919  GLUCAP 151* 198* 178*    Other Stroke Risk Factors  Advanced age  Obesity, Body mass index is 38.35 kg/m., recommend weight loss, diet and exercise as appropriate   Migraines  Previous strokes by imaging  Other Active Problems CKD - stage 3a - creatinine - 1.11 Seizure hx - phenobarbital (phenobarbital level 27.1)  Hospital day # 3 Continue mobilization out of bed.  Ongoing therapy consults.  Likely need inpatient rehab.  Long discussion with patient and husband at the bedside and with physical therapist and answered questions.  Greater than 50% time during this 25-minute visit was spent in counseling and coordination of care about her stroke and hemiparesis and answering questions. Antony Contras, MD  To contact Stroke Continuity provider, please refer to [email or web address] After hours, contact General Neurology

## 2020-09-05 NOTE — Plan of Care (Signed)
  Problem: Education: Goal: Knowledge of General Education information will improve Description: Including pain rating scale, medication(s)/side effects and non-pharmacologic comfort measures Outcome: Progressing   Problem: Health Behavior/Discharge Planning: Goal: Ability to manage health-related needs will improve Outcome: Progressing   Problem: Clinical Measurements: Goal: Ability to maintain clinical measurements within normal limits will improve Outcome: Progressing Goal: Will remain free from infection Outcome: Progressing Goal: Diagnostic test results will improve Outcome: Progressing Goal: Respiratory complications will improve Outcome: Progressing Goal: Cardiovascular complication will be avoided Outcome: Progressing   Problem: Activity: Goal: Risk for activity intolerance will decrease Outcome: Progressing   Problem: Nutrition: Goal: Adequate nutrition will be maintained Outcome: Progressing   Problem: Coping: Goal: Level of anxiety will decrease Outcome: Progressing   Problem: Elimination: Goal: Will not experience complications related to bowel motility Outcome: Progressing Goal: Will not experience complications related to urinary retention Outcome: Progressing   Problem: Pain Managment: Goal: General experience of comfort will improve Outcome: Progressing   Problem: Safety: Goal: Ability to remain free from injury will improve Outcome: Progressing   Problem: Skin Integrity: Goal: Risk for impaired skin integrity will decrease Outcome: Progressing   Problem: Education: Goal: Knowledge of disease or condition will improve Outcome: Progressing Goal: Knowledge of secondary prevention will improve Outcome: Progressing Goal: Knowledge of patient specific risk factors addressed and post discharge goals established will improve Outcome: Progressing Goal: Individualized Educational Video(s) Outcome: Progressing   Problem: Coping: Goal: Will verbalize  positive feelings about self Outcome: Progressing Goal: Will identify appropriate support needs Outcome: Progressing   Problem: Self-Care: Goal: Ability to participate in self-care as condition permits will improve Outcome: Progressing Goal: Verbalization of feelings and concerns over difficulty with self-care will improve Outcome: Progressing Goal: Ability to communicate needs accurately will improve Outcome: Progressing

## 2020-09-05 NOTE — Consult Note (Signed)
Physical Medicine and Rehabilitation Consult   Reason for Consult: ICH with functional deficits. Referring Physician: Dr. Leonie [name]   HPI: Natalie [name] is a 69 y.o. RH-female with history of seizure disorder, migraines, CKD, T2DM with peripheral neuropathy, chronic pain, chronic UTIs who was admitted on 09/02/20 with acute onset of left sided weakness with left facial droop and mild slurred speech. CT head [name] showing 2 cc acute hematoma in right corona radiata with early extension into right lateral ventricle.  Follow up CT head showed stable bleed. MRI brain showed IPH without underlying lesions and few chronic microhemorrhages as well as mild to moderate chronic ischemic changes. CTA head/neck was negative for LVO, aneurysm or occlusion. Bleed felt to be hypertensive in nature with SBP goal<140. 2D echo showed EF 60-65%. Therapy evaluations completed revealing left sided weakness affecting ADLs and mobility. CIR recommended due to functional decline.    Review of Systems  Constitutional: Negative for chills and fever.  HENT: Negative for hearing loss.   Eyes: Negative for blurred vision, double vision and photophobia.  Respiratory: Negative for shortness of breath and wheezing.   Cardiovascular: Negative for chest pain and palpitations.  Gastrointestinal: Negative for heartburn and nausea.  Genitourinary: Negative for dysuria and urgency.  Musculoskeletal: Negative for back pain, joint pain and myalgias.  Skin: Negative for itching.  Neurological: Positive for sensory change and headaches. Negative for dizziness.  Psychiatric/Behavioral: Negative for memory loss.     Past Medical History:  Diagnosis Date  . Arthritis   . Atrophy of left kidney   . Cervicalgia   . Chronic kidney disease, stage 3 ([place])    acute aki 03-14-2018 in setting pyelonehritis-- resolved  . Chronic migraine   . Chronic pain syndrome   . History of kidney stones   . History of pyelonephritis    hx  recurrent  . History of recurrent UTIs   . History of sepsis    07/ 2017 secondary to pyelonephritis  . Hypertension   . Hypothyroidism   . Iron deficiency anemia   . Left ureteral stone   . Mixed hyperlipidemia   . Nephrolithiasis    right side nonobstructive per CT 03-14-2018  . Right shoulder pain    post sx 02-18-2018  . Seizure disorder ([place]) followed by pcp   per pt dx age 65--  no seizure since 1980's , controlled w/ phenobarbitol  . Type 2 diabetes mellitus ([place])    followed by pcp  . Wears glasses     Past Surgical History:  Procedure Laterality Date  . CESAREAN SECTION  1974  . CHOLECYSTECTOMY OPEN  2002  . CYSTO/  LEFT RETROGRADE PYELOGRAM  06-11-2006   dr Natalie [name]  [facility]  . CYSTOSCOPY WITH RETROGRADE PYELOGRAM, URETEROSCOPY AND STENT PLACEMENT Left 04/10/2018   Procedure: CYSTOSCOPY WITH RETROGRADE PYELOGRAM, URETEROSCOPY AND STENT PLACEMENT;  Surgeon: Natalie Gallo, MD;  Location: [facility];  Service: Urology;  Laterality: Left;  . FOOT ARTHRODESIS Right 09/10/2019   Procedure: ARTHRODESIS FOOT;  Surgeon: Natalie Crocker, MD;  Location: [place];  Service: Orthopedics;  Laterality: Right;  . IR URETERAL STENT PLACEMENT EXISTING ACCESS RIGHT  08-21-2007    dr Natalie [name]  [facility]  . LEFT RETROGRADE PYELOGRAM/ LEFT URETERAL STENT PLACEMENT  02-25-2001   dr Natalie [name] [facility]  . NEPHROLITHOTOMY Right 05/14/2016   Procedure: NEPHROLITHOTOMY PERCUTANEOUS;  Surgeon: Natalie Gallo, MD;  Location: [facility];  Service: Urology;  Laterality: Right;  . OPEN  REDUCTION INTERNAL FIXATION (ORIF) FOOT LISFRANC FRACTURE Right 09/10/2019   Procedure: OPEN REDUCTION INTERNAL FIXATION (ORIF) FOOT LISFRANC FRACTURE;  Surgeon: Natalie Crocker, MD;  Location: [place];  Service: Orthopedics;  Laterality: Right;  . REPAIR EXTENSOR TENDON WITH METATARSAL OSTEOTOMY AND OPEN REDUCTION IN Right 09/10/2019   Procedure: OPEN TREATMENT RIGHT FIRST,  SECOND, THIRD TARSOMETATARSAL LISFRANC, MIDFOOT FUSION;  Surgeon: Natalie Crocker, MD;  Location: [place];  Service: Orthopedics;  Laterality: Right;  . SHOULDER ARTHROSCOPY Bilateral left 11-13-2000;  right 02-18-2018    Family History  Problem Relation Age of Onset  . Heart disease Father   . Diabetes Father     Social History:  Natalie [name] works but is off this week and daughter can assist at discharge.  She reports that she has never smoked. She has never used smokeless tobacco. She reports that she does not drink alcohol and does not use drugs.     Allergies  Allergen Reactions  . Compazine Other (See Comments)    Bad headache, vomiting; IV   . Cymbalta [Duloxetine Hcl] Other (See Comments)    "Kept me awake"  . Methadone Hives  . Escitalopram Anxiety  . Macrobid [Nitrofurantoin Monohyd Macro] Rash    Medications Prior to Admission  Medication Sig Dispense Refill  . albuterol (VENTOLIN HFA) 108 (90 Base) MCG/ACT inhaler Inhale 2 puffs into the lungs every 4 (four) hours as needed for wheezing or shortness of breath. 18 g 0  . amLODipine (NORVASC) 5 MG tablet Take 1 tablet (5 mg total) by mouth daily. (Patient taking differently: Take 5 mg by mouth daily with supper. ) 90 tablet 3  . atorvastatin (LIPITOR) 40 MG tablet Take 1 tablet (40 mg total) by mouth daily at 6 PM. 90 tablet 1  . butorphanol (STADOL) 10 MG/ML nasal spray PLACE 1 SPRAY INTO THE NOSE EVERY 4 HOURS AS NEEDED FOR MIGRAINE. (Patient taking differently: Place 1 spray into the nose every 4 (four) hours as needed for headache or migraine. PLACE 1 SPRAY INTO THE NOSE EVERY 4 HOURS AS NEEDED FOR MIGRAINE.) 2.5 mL 5  . glipiZIDE (GLUCOTROL) 10 MG tablet TAKE 2 TABLETS (20 MG TOTAL) BY MOUTH 2 (TWO) TIMES DAILY BEFORE A MEAL. (Patient taking differently: Take 10 mg by mouth daily before breakfast. ) 360 tablet 0  . insulin NPH Human (HUMULIN N,NOVOLIN N) 100 UNIT/ML injection Inject 0.05 mLs (5  Units total) into the skin 2 (two) times daily before a meal. (Patient taking differently: Inject 25 Units into the skin 2 (two) times daily before a meal. ) 10 mL 0  . levothyroxine (SYNTHROID, LEVOTHROID) 75 MCG tablet Take 1 tablet (75 mcg total) by mouth daily. 90 tablet 1  . olmesartan (BENICAR) 40 MG tablet Take 1 tablet (40 mg total) by mouth daily. 90 tablet 0  . PHENobarbital (LUMINAL) 97.2 MG tablet TAKE 2 TABLETS BY MOUTH ONCE DAILY ON MON,WED,AND FRI AND 1 TABLET DAILY ON OTHER DAYS (Patient taking differently: Take 97.2-194.4 mg by mouth See admin instructions. TAKE 2 TABLETS (194.4 MG TOTALLY) BY MOUTH ON MON, FRI, SUN; TAKE 1 TABLET (97.2 MG TOTALLY) DAILY ON OTHER DAYS) 135 tablet 1  . tiZANidine (ZANAFLEX) 4 MG tablet TAKE 1 TABLET BY MOUTH EVERY 4-6 HOURS AS NEEDED FOR MUSCLE SPASM (Patient taking differently: Take 4 mg by mouth every 4 (four) hours as needed for muscle spasms. ) 180 tablet 1  . zolpidem (AMBIEN) 10 MG tablet take 1 tablet  by mouth at bedtime for sleep if needed. (Patient taking differently: Take 10 mg by mouth at bedtime as needed for sleep. take 1 tablet by mouth at bedtime for sleep if needed.) 90 tablet 1  . Insulin Syringe-Needle U-100 (INSULIN SYRINGE 1CC/31GX5/16") 31G X 5/16" 1 ML MISC 1 Units by Does not apply route as directed. Dx: E11.29 200 each 3  . predniSONE (DELTASONE) 50 MG tablet Take 1 tablet (50 mg total) by mouth daily with breakfast. (Patient not taking: Reported on 09/02/2020) 5 tablet 0    Home: Home Living Family/patient expects to be discharged to:: Private residence Living Arrangements: Spouse/significant other, Children Available Help at Discharge: Family, Other (Comment) (reports spouse can likely take off work) Type of Home: House Home Access: Stairs to enter [employer] of Steps: 4 (previously had ramp, spouse states he can reinstall ramp if needed) Entrance Stairs-Rails: None Home Layout: One level Bathroom Shower/Tub:  Multimedia programmer: Standard Home Equipment: Environmental consultant - 2 wheels, Environmental consultant - 4 wheels, [place] - single point, Grab bars - tub/shower, Grab bars - toilet  Lives With: Family (daughers are paramedics)  Functional History: Prior Function Level of Independence: Independent Comments: regained independence following injury last year requiring use of AD Functional Status:  Mobility: Bed Mobility Overal bed mobility: Needs Assistance Bed Mobility: Supine to Sit Supine to sit: Min guard General bed mobility comments: for balance, lines and safety Transfers Overall transfer level: Needs assistance Equipment used: 1 person hand held assist Transfers: Sit to/from Stand, Stand Pivot Transfers Sit to Stand: Min assist Stand pivot transfers: Mod assist General transfer comment: no noted buckling in LLE however pt having difficulty with stepping LLE, somewhat dragging LE during transitions, heavy reliance on UE support reaching for arms of BSC start of transfer; stand pivot to [facility] and then few steps to recliner  Ambulation/Gait Ambulation/Gait assistance: Min assist, +2 physical assistance Gait Distance (Feet): 7 Feet Assistive device: 2 person hand held assist Gait Pattern/deviations: Step-to pattern, Decreased stride length General Gait Details: small forward steps with chair follow. increased assist to steady with stance on LLE, but pt able to advance BLE without assist Gait velocity: decreased Gait velocity interpretation: <1.31 ft/sec, indicative of household ambulator    ADL: ADL Overall ADL's : Needs assistance/impaired Eating/Feeding: Set up, Sitting Grooming: Wash/dry hands, Set up, Sitting Upper Body Bathing: Min guard, Sitting Lower Body Bathing: Moderate assistance, Sit to/from stand Upper Body Dressing : Min guard, Sitting Lower Body Dressing: Moderate assistance, Sit to/from stand Toilet Transfer: Moderate assistance, Stand-pivot, BSC Toileting- Clothing Manipulation and  Hygiene: Moderate assistance, Sit to/from stand Toileting - Clothing Manipulation Details (indicate cue type and reason): assist for balance in standing and gown management while pt completing anterior pericare  Functional mobility during ADLs: Moderate assistance (stand pivot, few steps BSC to recliner )  Cognition: Cognition Overall Cognitive Status: Within Functional Limits for tasks assessed Orientation Level: Oriented X4 Cognition Arousal/Alertness: Awake/alert Behavior During Therapy: WFL for tasks assessed/performed Overall Cognitive Status: Within Functional Limits for tasks assessed General Comments: not formally assessed but seems [facility] for basic tasks    Blood pressure (!) 154/64, pulse 89, temperature 98.9 F (37.2 C), temperature source Oral, resp. rate 12, height 4\' 8"  (1.422 m), weight 77.6 kg, SpO2 97 %. Physical Exam Vitals and nursing note reviewed.  Constitutional:      Appearance: Normal appearance.  HENT:     Head: Atraumatic.     Nose: Nose normal.  Eyes:  General: No visual field deficit.    Extraocular Movements: Extraocular movements intact.     Conjunctiva/sclera: Conjunctivae normal.     Pupils: Pupils are equal, round, and reactive to light.  Cardiovascular:     Rate and Rhythm: Normal rate and regular rhythm.     Heart sounds: Normal heart sounds. No murmur heard.   Pulmonary:     Effort: Pulmonary effort is normal. No respiratory distress.     Breath sounds: Normal breath sounds. No stridor.  Abdominal:     General: Abdomen is flat. Bowel sounds are normal. There is no distension.     Palpations: Abdomen is soft.     Tenderness: There is no abdominal tenderness.  Musculoskeletal:     Right lower leg: No edema.     Comments: No pain with UE or LE ROM  Skin:    General: Skin is warm and dry.  Neurological:     Mental Status: She is alert and oriented to person, place, and time.     Cranial Nerves: Dysarthria present. No facial asymmetry.      Sensory: Sensation is intact.     Motor: Weakness present.     Comments: Left facial weakness without dysarthria.  Motor 5/5 in BUE 4/5 R HF and L HF 5/5 R quad, 4/5 L quad 4/5 B nd kle DF     Results for orders placed or performed during the hospital encounter of 09/02/20 (from the past 24 hour(s))  Glucose, capillary     Status: Abnormal   Collection Time: 09/04/20  6:10 PM  Result Value Ref Range   Glucose-Capillary 151 (H) 70 - 99 mg/dL  Glucose, capillary     Status: Abnormal   Collection Time: 09/04/20  9:21 PM  Result Value Ref Range   Glucose-Capillary 198 (H) 70 - 99 mg/dL   Comment 1 Notify RN    Comment 2 Document in Chart   Glucose, capillary     Status: Abnormal   Collection Time: 09/05/20  9:19 AM  Result Value Ref Range   Glucose-Capillary 178 (H) 70 - 99 mg/dL  Glucose, capillary     Status: Abnormal   Collection Time: 09/05/20 12:13 PM  Result Value Ref Range   Glucose-Capillary 325 (H) 70 - 99 mg/dL   CT ANGIO HEAD W OR WO CONTRAST  Result Date: 09/04/2020 CLINICAL DATA:  Stroke suspected EXAM: CT ANGIOGRAPHY HEAD AND NECK TECHNIQUE: Multidetector CT imaging of the head and neck was performed using the standard protocol during bolus administration of intravenous contrast. Multiplanar CT image reconstructions and MIPs were obtained to evaluate the vascular anatomy. Carotid stenosis measurements (when applicable) are obtained utilizing NASCET criteria, using the distal internal carotid diameter as the denominator. CONTRAST:  20mL OMNIPAQUE IOHEXOL 350 MG/ML SOLN COMPARISON:  None. FINDINGS: CT HEAD FINDINGS Brain: Unchanged focus of hemorrhage in the posterior right basal ganglia. Generalized volume loss. There is hypoattenuation of the periventricular white matter, most commonly indicating chronic ischemic microangiopathy. Skull: The visualized skull base, calvarium and extracranial soft tissues are normal. Sinuses/Orbits: No fluid levels or advanced mucosal  thickening of the visualized paranasal sinuses. No mastoid or middle ear effusion. The orbits are normal. CTA NECK FINDINGS SKELETON: There is no bony spinal canal stenosis. No lytic or blastic lesion. OTHER NECK: Normal pharynx, larynx and major salivary glands. No cervical lymphadenopathy. Unremarkable thyroid gland. UPPER CHEST: No pneumothorax or pleural effusion. No nodules or masses. AORTIC ARCH: There is no calcific atherosclerosis of the aortic arch.  There is no aneurysm, dissection or hemodynamically significant stenosis of the visualized portion of the aorta. Conventional 3 vessel aortic branching pattern. The visualized proximal subclavian arteries are widely patent. RIGHT CAROTID SYSTEM: Normal without aneurysm, dissection or stenosis. LEFT CAROTID SYSTEM: Normal without aneurysm, dissection or stenosis. VERTEBRAL ARTERIES: Left dominant configuration. Both origins are clearly patent. There is no dissection, occlusion or flow-limiting stenosis to the skull base (V1-V3 segments). CTA HEAD FINDINGS POSTERIOR CIRCULATION: --Vertebral arteries: Normal V4 segments. --Inferior cerebellar arteries: Normal. --Basilar artery: Normal. --Superior cerebellar arteries: Normal. --Posterior cerebral arteries (PCA): Normal. ANTERIOR CIRCULATION: --Intracranial internal carotid arteries: Normal. --Anterior cerebral arteries (ACA): Normal. Both A1 segments are present. Patent anterior communicating artery (a-comm). --Middle cerebral arteries (MCA): Normal. VENOUS SINUSES: As permitted by contrast timing, patent. ANATOMIC VARIANTS: None Review of the MIP images confirms the above findings. IMPRESSION: 1. No emergent large vessel occlusion or high-grade stenosis of the intracranial arteries. No aneurysm or vascular malformation. 2. Unchanged focus of hemorrhage in the posterior right basal ganglia. Aortic Atherosclerosis (ICD10-I70.0). Electronically Signed   By: Ulyses Jarred M.D.   On: 09/04/2020 04:55   CT ANGIO NECK W  OR WO CONTRAST  Result Date: 09/04/2020 CLINICAL DATA:  Stroke suspected EXAM: CT ANGIOGRAPHY HEAD AND NECK TECHNIQUE: Multidetector CT imaging of the head and neck was performed using the standard protocol during bolus administration of intravenous contrast. Multiplanar CT image reconstructions and MIPs were obtained to evaluate the vascular anatomy. Carotid stenosis measurements (when applicable) are obtained utilizing NASCET criteria, using the distal internal carotid diameter as the denominator. CONTRAST:  65mL OMNIPAQUE IOHEXOL 350 MG/ML SOLN COMPARISON:  None. FINDINGS: CT HEAD FINDINGS Brain: Unchanged focus of hemorrhage in the posterior right basal ganglia. Generalized volume loss. There is hypoattenuation of the periventricular white matter, most commonly indicating chronic ischemic microangiopathy. Skull: The visualized skull base, calvarium and extracranial soft tissues are normal. Sinuses/Orbits: No fluid levels or advanced mucosal thickening of the visualized paranasal sinuses. No mastoid or middle ear effusion. The orbits are normal. CTA NECK FINDINGS SKELETON: There is no bony spinal canal stenosis. No lytic or blastic lesion. OTHER NECK: Normal pharynx, larynx and major salivary glands. No cervical lymphadenopathy. Unremarkable thyroid gland. UPPER CHEST: No pneumothorax or pleural effusion. No nodules or masses. AORTIC ARCH: There is no calcific atherosclerosis of the aortic arch. There is no aneurysm, dissection or hemodynamically significant stenosis of the visualized portion of the aorta. Conventional 3 vessel aortic branching pattern. The visualized proximal subclavian arteries are widely patent. RIGHT CAROTID SYSTEM: Normal without aneurysm, dissection or stenosis. LEFT CAROTID SYSTEM: Normal without aneurysm, dissection or stenosis. VERTEBRAL ARTERIES: Left dominant configuration. Both origins are clearly patent. There is no dissection, occlusion or flow-limiting stenosis to the skull base  (V1-V3 segments). CTA HEAD FINDINGS POSTERIOR CIRCULATION: --Vertebral arteries: Normal V4 segments. --Inferior cerebellar arteries: Normal. --Basilar artery: Normal. --Superior cerebellar arteries: Normal. --Posterior cerebral arteries (PCA): Normal. ANTERIOR CIRCULATION: --Intracranial internal carotid arteries: Normal. --Anterior cerebral arteries (ACA): Normal. Both A1 segments are present. Patent anterior communicating artery (a-comm). --Middle cerebral arteries (MCA): Normal. VENOUS SINUSES: As permitted by contrast timing, patent. ANATOMIC VARIANTS: None Review of the MIP images confirms the above findings. IMPRESSION: 1. No emergent large vessel occlusion or high-grade stenosis of the intracranial arteries. No aneurysm or vascular malformation. 2. Unchanged focus of hemorrhage in the posterior right basal ganglia. Aortic Atherosclerosis (ICD10-I70.0). Electronically Signed   By: Ulyses Jarred M.D.   On: 09/04/2020 04:55   ECHOCARDIOGRAM COMPLETE  Result  Date: 09/04/2020    ECHOCARDIOGRAM REPORT   Patient Name:   DARIAN ACE Date of Exam: 09/04/2020 Medical Rec #:  [id number]     Height:       56.0 in Accession #:    [id number]    Weight:       171.1 lb Date of Birth:  05-20-1951     BSA:          1.661 m Patient Age:    20 years      BP:           156/77 mmHg Patient Gender: F             HR:           85 bpm. Exam Location:  Inpatient Procedure: 2D Echo, Color Doppler, Cardiac Doppler and Intracardiac            Opacification Agent Indications:    Stroke 434.91 / I163.9  History:        Patient has no prior history of Echocardiogram examinations.                 Risk Factors:Hypertension, Diabetes and Dyslipidemia. Chronic                 kidney disease, history of seizures.  Sonographer:    [name] RDCS Referring Phys: [id number] ASHISH ARORA IMPRESSIONS  1. Left ventricular ejection fraction, by estimation, is 60 to 65%. The left ventricle has normal function. Left ventricular endocardial border  not optimally defined to evaluate regional wall motion. Left ventricular diastolic parameters are consistent with Grade I diastolic dysfunction (impaired relaxation).  2. Right ventricular systolic function is normal. The right ventricular size is normal. Tricuspid regurgitation signal is inadequate for assessing PA pressure.  3. The mitral valve is normal in structure. No evidence of mitral valve regurgitation. No evidence of mitral stenosis.  4. The aortic valve is grossly normal. Aortic valve regurgitation is not visualized. No aortic stenosis is present.  5. The inferior vena cava is normal in size with greater than 50% respiratory variability, suggesting right atrial pressure of 3 mmHg. Conclusion(s)/Recommendation(s): No intracardiac source of embolism detected on this transthoracic study. A transesophageal echocardiogram is recommended to exclude cardiac source of embolism if clinically indicated. FINDINGS  Left Ventricle: Left ventricular ejection fraction, by estimation, is 60 to 65%. The left ventricle has normal function. Left ventricular endocardial border not optimally defined to evaluate regional wall motion. Definity contrast agent was given IV to delineate the left ventricular endocardial borders. The left ventricular internal cavity size was normal in size. There is no left ventricular hypertrophy. Left ventricular diastolic parameters are consistent with Grade I diastolic dysfunction (impaired relaxation). Right Ventricle: The right ventricular size is normal. No increase in right ventricular wall thickness. Right ventricular systolic function is normal. Tricuspid regurgitation signal is inadequate for assessing PA pressure. Left Atrium: Left atrial size was normal in size. Right Atrium: Right atrial size was normal in size. Pericardium: There is no evidence of pericardial effusion. Mitral Valve: The mitral valve is normal in structure. No evidence of mitral valve regurgitation. No evidence of  mitral valve stenosis. Tricuspid Valve: The tricuspid valve is normal in structure. Tricuspid valve regurgitation is not demonstrated. No evidence of tricuspid stenosis. Aortic Valve: The aortic valve is grossly normal. Aortic valve regurgitation is not visualized. No aortic stenosis is present. Pulmonic Valve: The pulmonic valve was normal in structure. Pulmonic valve regurgitation is not visualized. No evidence  of pulmonic stenosis. Aorta: The aortic root is normal in size and structure. Venous: The inferior vena cava is normal in size with greater than 50% respiratory variability, suggesting right atrial pressure of 3 mmHg. IAS/Shunts: The interatrial septum was not well visualized.  LEFT VENTRICLE PLAX 2D LVOT diam:     1.90 cm  Diastology LV SV:         61       LV e' medial:    4.70 cm/s LV SV Index:   37       LV E/e' medial:  17.2 LVOT Area:     2.84 cm LV e' lateral:   5.35 cm/s                         LV E/e' lateral: 15.1                          3D Volume EF                         LV 3D EDV:   67.68 ml                         LV 3D ESV:   30.87 ml RIGHT VENTRICLE RV S prime:     12.40 cm/s TAPSE (M-mode): 1.6 cm LEFT ATRIUM             Index       RIGHT ATRIUM          Index LA Vol (A2C):   25.8 ml 15.53 ml/m RA Area:     8.14 cm LA Vol (A4C):   30.0 ml 18.06 ml/m RA Volume:   15.40 ml 9.27 ml/m LA Biplane Vol: 28.6 ml 17.22 ml/m  AORTIC VALVE LVOT Vmax:   105.00 cm/s LVOT Vmean:  86.400 cm/s LVOT VTI:    0.215 m  AORTA Ao Root diam: 2.80 cm MITRAL VALVE MV Area (PHT): 3.08 cm     SHUNTS MV Decel Time: 246 msec     Systemic VTI:  0.22 m MV E velocity: 80.80 cm/s   Systemic Diam: 1.90 cm MV A velocity: 133.00 cm/s MV E/A ratio:  0.61 Cherlynn Kaiser MD Electronically signed by Cherlynn Kaiser MD Signature Date/Time: 09/04/2020/3:39:22 PM    Final      Assessment/Plan: Diagnosis: RIght corona radiata intracranial bleed 1. Does the need for close, 24 hr/day medical supervision in concert with  the patient's rehab needs make it unreasonable for this patient to be served in a less intensive setting? Yes 2. Co-Morbidities requiring supervision/potential complications: S1XB, peripheral neuropathy, hx sz d/o, chronic UTI 3. Due to bladder management, bowel management, safety, skin/wound care, disease management, medication administration, pain management and patient education, does the patient require 24 hr/day rehab nursing? Yes 4. Does the patient require coordinated care of a physician, rehab nurse, therapy disciplines of PT< OT< SLP to address physical and functional deficits in the context of the above medical diagnosis(es)? Yes Addressing deficits in the following areas: balance, endurance, locomotion, strength, transferring, bowel/bladder control, bathing, dressing, toileting, cognition, speech and psychosocial support 5. Can the patient actively participate in an intensive therapy program of at least 3 hrs of therapy per day at least 5 days per week? Yes 6. The potential for patient to make measurable gains while on inpatient rehab is good 7. Anticipated functional outcomes  upon discharge from inpatient rehab are modified independent  with PT, modified independent with OT, modified independent with SLP. 8. Estimated rehab length of stay to reach the above functional goals is: 7-10d 9. Anticipated discharge destination: Home 10. Overall Rehab/Functional Prognosis: excellent  RECOMMENDATIONS: This patient's condition is appropriate for continued rehabilitative care in the following setting: CIR Patient has agreed to participate in recommended program. Yes Note that insurance prior authorization may be required for reimbursement for recommended care.  Comment:    Bary Leriche, PA-C 09/05/2020   "I have personally performed a face to face diagnostic evaluation of this patient.  Additionally, I have reviewed and concur with the physician assistant's documentation above." Charlett Blake M.D. [facility] FAAPM&R (Neuromuscular Med) Diplomate Am Board of Electrodiagnostic Med Fellow Am Board of Interventional Pain

## 2020-09-05 NOTE — Progress Notes (Signed)
Inpatient Rehabilitation Admissions Coordinator  Patient could benefit from an inpt rehab admit pending bed availability when medically ready for d/c. I will follow up tomorrow to assist with planning.  Danne Baxter, RN, MSN Rehab Admissions Coordinator [phone] 09/05/2020 4:03 PM

## 2020-09-05 NOTE — Care Management Important Message (Signed)
Important Message  Patient Details  Name: Natalie [name] MRN: [id number] Date of Birth: [birth year]   Medicare Important Message Given:  Yes Due to staffing patient room called spoke with the patient and advised of the IM document  patient ask that I send it to her home address.    Janaisha Tolsma 09/05/2020, 3:01 PM

## 2020-09-05 NOTE — Progress Notes (Signed)
{PT ALL NOTES:Physical Therapy Treatment Patient Details Name: Natalie [name] MRN: [id number] DOB: April 25, 1951 Today's Date: 09/05/2020    History of Present Illness The pt is a 69 yo female presenting with L-sided facial droop and weakness. 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 PMH includes: chronic headaches, HTN, HLD, DM II, CKD III, recurrent UTIs, and seizures.      PT Comments    Pt with improved ambulation tolerance but demonstrates L sided weakness and balance impairment requiring RW for safe ambulation. Pt with generalized deconditioning and a weak R LE as well due to having broke it last year. Pt with good home set up and support. Acute PT to cont to follow to progress mobility to supervision.    Follow Up Recommendations  CIR (may progress to HHPT)     Equipment Recommendations  Rolling walker with 5" wheels;3in1 (PT)    Recommendations for Other Services       Precautions / Restrictions Precautions Precautions: Fall Restrictions Weight Bearing Restrictions: No    Mobility  Bed Mobility               General bed mobility comments: pt up in chair upon PT arrival  Transfers Overall transfer level: Needs assistance Equipment used: Rolling walker (2 wheeled) Transfers: Sit to/from Stand Sit to Stand: Min assist         General transfer comment: minA to power up, verbal cues to push up from chair not pull up on walker, increased time  Ambulation/Gait Ambulation/Gait assistance: Min assist;+2 safety/equipment Gait Distance (Feet): 60 Feet (x2) Assistive device: Rolling walker (2 wheeled) Gait Pattern/deviations: Step-through pattern;Decreased stride length Gait velocity: decresaed Gait velocity interpretation: <1.31 ft/sec, indicative of household ambulator General Gait Details: short step lenght but able to clear both feet, pt reports R LE not being strong from "breaking it last year". pt with increased UE  dependence on RW but no episode of LOB   Stairs             Wheelchair Mobility    Modified Rankin (Stroke Patients Only) Modified Rankin (Stroke Patients Only) Pre-Morbid Rankin Score: No significant disability Modified Rankin: Moderately severe disability     Balance Overall balance assessment: Needs assistance Sitting-balance support: No upper extremity supported;Feet unsupported Sitting balance-Leahy Scale: Fair     Standing balance support: Bilateral upper extremity supported;During functional activity Standing balance-Leahy Scale: Poor Standing balance comment: reliant on UE support/external assist                             Cognition Arousal/Alertness: Awake/alert Behavior During Therapy: WFL for tasks assessed/performed Overall Cognitive Status: Within Functional Limits for tasks assessed                                 General Comments: flat affect, but A&Ox3      Exercises      General Comments General comments (skin integrity, edema, etc.): VSS on RA, SpO2 >94%      Pertinent Vitals/Pain Pain Assessment: No/denies pain    Home Living                      Prior Function            PT Goals (current goals can now be found in the care plan section) Acute Rehab  PT Goals Patient Stated Goal: return home Progress towards PT goals: Progressing toward goals    Frequency    Min 4X/week      PT Plan Current plan remains appropriate    Co-evaluation              AM-PAC PT "6 Clicks" Mobility   Outcome Measure  Help needed turning from your back to your side while in a flat bed without using bedrails?: A Little Help needed moving from lying on your back to sitting on the side of a flat bed without using bedrails?: A Little Help needed moving to and from a bed to a chair (including a wheelchair)?: A Little Help needed standing up from a chair using your arms (e.g., wheelchair or bedside chair)?: A  Little Help needed to walk in hospital room?: A Little Help needed climbing 3-5 steps with a railing? : A Lot 6 Click Score: 17    End of Session Equipment Utilized During Treatment: Gait belt Activity Tolerance: Patient tolerated treatment well Patient left: in chair;with call bell/phone within reach;with chair alarm set;with family/visitor present Nurse Communication: Mobility status PT Visit Diagnosis: Other abnormalities of gait and mobility (R26.89)     Time: 2257-5051 PT Time Calculation (min) (ACUTE ONLY): 29 min  Charges:  $Gait Training: 23-37 mins                     Kittie Plater, PT, DPT Acute Rehabilitation Services Pager #: [phone] Office #: [phone]    Berline Lopes 09/05/2020, 3:45 PM

## 2020-09-06 ENCOUNTER — Encounter (HOSPITAL_COMMUNITY): Payer: Self-pay | Admitting: Physical Medicine & Rehabilitation

## 2020-09-06 ENCOUNTER — Inpatient Hospital Stay (HOSPITAL_COMMUNITY)
Admission: RE | Admit: 2020-09-06 | Discharge: 2020-09-15 | DRG: 057 | Disposition: A | Discharge status: Home Health | Payer: Medicare Other | Source: Intra-hospital | Attending: Physical Medicine & Rehabilitation | Admitting: Physical Medicine & Rehabilitation

## 2020-09-06 ENCOUNTER — Inpatient Hospital Stay (HOSPITAL_COMMUNITY): Payer: Medicare Other

## 2020-09-06 ENCOUNTER — Other Ambulatory Visit: Payer: Self-pay

## 2020-09-06 DIAGNOSIS — E782 Mixed hyperlipidemia: Secondary | ICD-10-CM | POA: Diagnosis present

## 2020-09-06 DIAGNOSIS — D509 Iron deficiency anemia, unspecified: Secondary | ICD-10-CM | POA: Diagnosis present

## 2020-09-06 DIAGNOSIS — G40909 Epilepsy, unspecified, not intractable, without status epilepticus: Secondary | ICD-10-CM | POA: Diagnosis present

## 2020-09-06 DIAGNOSIS — W19XXXA Unspecified fall, initial encounter: Secondary | ICD-10-CM | POA: Diagnosis present

## 2020-09-06 DIAGNOSIS — N179 Acute kidney failure, unspecified: Secondary | ICD-10-CM | POA: Diagnosis present

## 2020-09-06 DIAGNOSIS — Z87442 Personal history of urinary calculi: Secondary | ICD-10-CM

## 2020-09-06 DIAGNOSIS — E11649 Type 2 diabetes mellitus with hypoglycemia without coma: Secondary | ICD-10-CM | POA: Diagnosis not present

## 2020-09-06 DIAGNOSIS — Z79899 Other long term (current) drug therapy: Secondary | ICD-10-CM | POA: Diagnosis not present

## 2020-09-06 DIAGNOSIS — S52532A Colles' fracture of left radius, initial encounter for closed fracture: Secondary | ICD-10-CM | POA: Diagnosis present

## 2020-09-06 DIAGNOSIS — Z8249 Family history of ischemic heart disease and other diseases of the circulatory system: Secondary | ICD-10-CM

## 2020-09-06 DIAGNOSIS — N183 Chronic kidney disease, stage 3 unspecified: Secondary | ICD-10-CM | POA: Diagnosis present

## 2020-09-06 DIAGNOSIS — I619 Nontraumatic intracerebral hemorrhage, unspecified: Secondary | ICD-10-CM | POA: Diagnosis present

## 2020-09-06 DIAGNOSIS — M542 Cervicalgia: Secondary | ICD-10-CM | POA: Diagnosis present

## 2020-09-06 DIAGNOSIS — Z888 Allergy status to other drugs, medicaments and biological substances status: Secondary | ICD-10-CM

## 2020-09-06 DIAGNOSIS — I69154 Hemiplegia and hemiparesis following nontraumatic intracerebral hemorrhage affecting left non-dominant side: Secondary | ICD-10-CM | POA: Diagnosis present

## 2020-09-06 DIAGNOSIS — Z794 Long term (current) use of insulin: Secondary | ICD-10-CM

## 2020-09-06 DIAGNOSIS — Z7984 Long term (current) use of oral hypoglycemic drugs: Secondary | ICD-10-CM

## 2020-09-06 DIAGNOSIS — G894 Chronic pain syndrome: Secondary | ICD-10-CM | POA: Diagnosis present

## 2020-09-06 DIAGNOSIS — I610 Nontraumatic intracerebral hemorrhage in hemisphere, subcortical: Secondary | ICD-10-CM | POA: Diagnosis not present

## 2020-09-06 DIAGNOSIS — E1142 Type 2 diabetes mellitus with diabetic polyneuropathy: Secondary | ICD-10-CM | POA: Diagnosis present

## 2020-09-06 DIAGNOSIS — D62 Acute posthemorrhagic anemia: Secondary | ICD-10-CM | POA: Diagnosis present

## 2020-09-06 DIAGNOSIS — Z9049 Acquired absence of other specified parts of digestive tract: Secondary | ICD-10-CM | POA: Diagnosis not present

## 2020-09-06 DIAGNOSIS — E039 Hypothyroidism, unspecified: Secondary | ICD-10-CM | POA: Diagnosis present

## 2020-09-06 DIAGNOSIS — I129 Hypertensive chronic kidney disease with stage 1 through stage 4 chronic kidney disease, or unspecified chronic kidney disease: Secondary | ICD-10-CM | POA: Diagnosis present

## 2020-09-06 DIAGNOSIS — Z9181 History of falling: Secondary | ICD-10-CM

## 2020-09-06 DIAGNOSIS — Z981 Arthrodesis status: Secondary | ICD-10-CM

## 2020-09-06 DIAGNOSIS — E1122 Type 2 diabetes mellitus with diabetic chronic kidney disease: Secondary | ICD-10-CM | POA: Diagnosis present

## 2020-09-06 DIAGNOSIS — N189 Chronic kidney disease, unspecified: Secondary | ICD-10-CM | POA: Diagnosis present

## 2020-09-06 DIAGNOSIS — Z87440 Personal history of urinary (tract) infections: Secondary | ICD-10-CM | POA: Diagnosis not present

## 2020-09-06 DIAGNOSIS — I10 Essential (primary) hypertension: Secondary | ICD-10-CM | POA: Diagnosis present

## 2020-09-06 DIAGNOSIS — G43909 Migraine, unspecified, not intractable, without status migrainosus: Secondary | ICD-10-CM | POA: Diagnosis present

## 2020-09-06 DIAGNOSIS — M25512 Pain in left shoulder: Secondary | ICD-10-CM | POA: Diagnosis present

## 2020-09-06 DIAGNOSIS — E119 Type 2 diabetes mellitus without complications: Secondary | ICD-10-CM

## 2020-09-06 DIAGNOSIS — Z833 Family history of diabetes mellitus: Secondary | ICD-10-CM | POA: Diagnosis not present

## 2020-09-06 DIAGNOSIS — Z885 Allergy status to narcotic agent status: Secondary | ICD-10-CM

## 2020-09-06 LAB — CBC
HCT: 33.4 % — ABNORMAL LOW (ref 36.0–46.0)
Hemoglobin: 10.4 g/dL — ABNORMAL LOW (ref 12.0–15.0)
MCH: 29.2 pg (ref 26.0–34.0)
MCHC: 31.1 g/dL (ref 30.0–36.0)
MCV: 93.8 fL (ref 80.0–100.0)
Platelets: 224 10*3/uL (ref 150–400)
RBC: 3.56 MIL/uL — ABNORMAL LOW (ref 3.87–5.11)
RDW: 14.5 % (ref 11.5–15.5)
WBC: 5.9 10*3/uL (ref 4.0–10.5)
nRBC: 0 % (ref 0.0–0.2)

## 2020-09-06 LAB — GLUCOSE, CAPILLARY
Glucose-Capillary: 147 mg/dL — ABNORMAL HIGH (ref 70–99)
Glucose-Capillary: 180 mg/dL — ABNORMAL HIGH (ref 70–99)
Glucose-Capillary: 237 mg/dL — ABNORMAL HIGH (ref 70–99)
Glucose-Capillary: 149 mg/dL — ABNORMAL HIGH (ref 70–99)
Glucose-Capillary: 270 mg/dL — ABNORMAL HIGH (ref 70–99)

## 2020-09-06 LAB — BASIC METABOLIC PANEL
Anion gap: 11 (ref 5–15)
BUN: 25 mg/dL — ABNORMAL HIGH (ref 8–23)
CO2: 24 mmol/L (ref 22–32)
Calcium: 9.6 mg/dL (ref 8.9–10.3)
Chloride: 106 mmol/L (ref 98–111)
Creatinine, Ser: 1.31 mg/dL — ABNORMAL HIGH (ref 0.44–1.00)
GFR, Estimated: 44 mL/min — ABNORMAL LOW (ref >60)
Glucose, Bld: 145 mg/dL — ABNORMAL HIGH (ref 70–99)
Potassium: 4.1 mmol/L (ref 3.5–5.1)
Sodium: 141 mmol/L (ref 135–145)

## 2020-09-06 MED ORDER — ACETAMINOPHEN 325 MG PO TABS
325.0000 mg | ORAL_TABLET | ORAL | Status: DC | PRN
  Administered 2020-09-08 – 2020-09-13 (×2): 650 mg via ORAL
  Filled 2020-09-06 (×4): qty 2

## 2020-09-06 MED ORDER — POLYETHYLENE GLYCOL 3350 17 G PO PACK: 17.0000 g | PACK | Freq: Every day | ORAL | Status: DC | PRN

## 2020-09-06 MED ORDER — ATORVASTATIN CALCIUM 20 MG PO TABS: 20.0000 mg | ORAL_TABLET | Freq: Every day | ORAL | Status: DC

## 2020-09-06 MED ORDER — ZOLPIDEM TARTRATE 5 MG PO TABS
5.0000 mg | ORAL_TABLET | Freq: Every evening | ORAL | Status: DC | PRN
  Administered 2020-09-06 – 2020-09-14 (×9): 5 mg via ORAL
  Filled 2020-09-06 (×9): qty 1

## 2020-09-06 MED ORDER — ALBUTEROL SULFATE HFA 108 (90 BASE) MCG/ACT IN AERS: 2.0000 | INHALATION_SPRAY | RESPIRATORY_TRACT | Status: DC | PRN

## 2020-09-06 MED ORDER — PHENOBARBITAL 97.2 MG PO TABS
97.2000 mg | ORAL_TABLET | ORAL | Status: DC
  Administered 2020-09-06 – 2020-09-14 (×6): 97.2 mg via ORAL
  Filled 2020-09-06 (×6): qty 1

## 2020-09-06 MED ORDER — ATORVASTATIN CALCIUM 10 MG PO TABS: 20.0000 mg | ORAL_TABLET | Freq: Every day | ORAL | Status: DC

## 2020-09-06 MED ORDER — FLEET ENEMA 7-19 GM/118ML RE ENEM: 1.0000 | ENEMA | Freq: Once | RECTAL | Status: DC | PRN

## 2020-09-06 MED ORDER — AMLODIPINE BESYLATE 5 MG PO TABS
5.0000 mg | ORAL_TABLET | Freq: Every day | ORAL | Status: DC
  Administered 2020-09-07 – 2020-09-15 (×9): 5 mg via ORAL
  Filled 2020-09-06 (×9): qty 1

## 2020-09-06 MED ORDER — TRAZODONE HCL 50 MG PO TABS: 25.0000 mg | ORAL_TABLET | Freq: Every evening | ORAL | Status: DC | PRN

## 2020-09-06 MED ORDER — ONDANSETRON HCL 4 MG/2ML IJ SOLN: 4.0000 mg | Freq: Four times a day (QID) | INTRAMUSCULAR | Status: DC | PRN

## 2020-09-06 MED ORDER — INSULIN NPH (HUMAN) (ISOPHANE) 100 UNIT/ML ~~LOC~~ SUSP
5.0000 [IU] | Freq: Two times a day (BID) | SUBCUTANEOUS | Status: DC
  Administered 2020-09-06 – 2020-09-07 (×3): 5 [IU] via SUBCUTANEOUS
  Filled 2020-09-06 (×2): qty 10

## 2020-09-06 MED ORDER — PHENOBARBITAL 97.2 MG PO TABS
194.4000 mg | ORAL_TABLET | ORAL | Status: DC
  Administered 2020-09-09 – 2020-09-12 (×3): 194.4 mg via ORAL
  Filled 2020-09-06 (×3): qty 2

## 2020-09-06 MED ORDER — ALUM & MAG HYDROXIDE-SIMETH 200-200-20 MG/5ML PO SUSP: 30.0000 mL | ORAL | Status: DC | PRN

## 2020-09-06 MED ORDER — BUTORPHANOL TARTRATE 10 MG/ML NA SOLN
1.0000 | NASAL | Status: DC | PRN
  Administered 2020-09-07 – 2020-09-14 (×4): 1 via NASAL
  Filled 2020-09-06 (×2): qty 2.5

## 2020-09-06 MED ORDER — SENNOSIDES-DOCUSATE SODIUM 8.6-50 MG PO TABS: 1.0000 | ORAL_TABLET | Freq: Two times a day (BID) | ORAL | Status: DC

## 2020-09-06 MED ORDER — SENNOSIDES-DOCUSATE SODIUM 8.6-50 MG PO TABS
1.0000 | ORAL_TABLET | Freq: Two times a day (BID) | ORAL | Status: DC
  Administered 2020-09-07: 1 via ORAL
  Filled 2020-09-06 (×7): qty 1

## 2020-09-06 MED ORDER — INSULIN ASPART 100 UNIT/ML ~~LOC~~ SOLN
0.0000 [IU] | Freq: Three times a day (TID) | SUBCUTANEOUS | 11 refills | Status: DC
Start: 2020-09-06 — End: 2020-10-26

## 2020-09-06 MED ORDER — GLIPIZIDE 5 MG PO TABS
10.0000 mg | ORAL_TABLET | Freq: Every day | ORAL | Status: DC
  Administered 2020-09-07 – 2020-09-15 (×9): 10 mg via ORAL
  Filled 2020-09-06 (×9): qty 2

## 2020-09-06 MED ORDER — INSULIN ASPART 100 UNIT/ML ~~LOC~~ SOLN
0.0000 [IU] | Freq: Every day | SUBCUTANEOUS | Status: DC
  Administered 2020-09-06: 3 [IU] via SUBCUTANEOUS
  Administered 2020-09-07 – 2020-09-08 (×2): 2 [IU] via SUBCUTANEOUS
  Administered 2020-09-09: 5 [IU] via SUBCUTANEOUS
  Administered 2020-09-10: 21:00:00 4 [IU] via SUBCUTANEOUS

## 2020-09-06 MED ORDER — LEVOTHYROXINE SODIUM 75 MCG PO TABS
75.0000 ug | ORAL_TABLET | Freq: Every day | ORAL | Status: DC
  Administered 2020-09-07 – 2020-09-15 (×9): 75 ug via ORAL
  Filled 2020-09-06 (×9): qty 1

## 2020-09-06 MED ORDER — ONDANSETRON HCL 4 MG PO TABS: 4.0000 mg | ORAL_TABLET | Freq: Four times a day (QID) | ORAL | Status: DC | PRN

## 2020-09-06 MED ORDER — DICLOFENAC SODIUM 1 % EX GEL
2.0000 g | Freq: Four times a day (QID) | CUTANEOUS | Status: DC
  Administered 2020-09-06 – 2020-09-09 (×7): 2 g via TOPICAL
  Filled 2020-09-06: qty 100

## 2020-09-06 MED ORDER — PANTOPRAZOLE SODIUM 40 MG PO TBEC
40.0000 mg | DELAYED_RELEASE_TABLET | Freq: Every day | ORAL | Status: DC
  Administered 2020-09-06 – 2020-09-14 (×9): 40 mg via ORAL
  Filled 2020-09-06: qty 1
  Filled 2020-09-06: qty 2
  Filled 2020-09-06 (×4): qty 1
  Filled 2020-09-06: qty 2
  Filled 2020-09-06: qty 1
  Filled 2020-09-06: qty 2
  Filled 2020-09-06: qty 1

## 2020-09-06 MED ORDER — TIZANIDINE HCL 4 MG PO TABS
4.0000 mg | ORAL_TABLET | ORAL | Status: DC | PRN
  Administered 2020-09-08 – 2020-09-15 (×15): 4 mg via ORAL
  Filled 2020-09-06 (×15): qty 1

## 2020-09-06 MED ORDER — INSULIN NPH (HUMAN) (ISOPHANE) 100 UNIT/ML ~~LOC~~ SUSP: 5.0000 [IU] | Freq: Two times a day (BID) | SUBCUTANEOUS | 11 refills | Status: DC

## 2020-09-06 MED ORDER — GUAIFENESIN-DM 100-10 MG/5ML PO SYRP: 5.0000 mL | ORAL_SOLUTION | Freq: Four times a day (QID) | ORAL | Status: DC | PRN

## 2020-09-06 MED ORDER — DIPHENHYDRAMINE HCL 12.5 MG/5ML PO ELIX: 12.5000 mg | ORAL_SOLUTION | Freq: Four times a day (QID) | ORAL | Status: DC | PRN

## 2020-09-06 MED ORDER — BISACODYL 10 MG RE SUPP: 10.0000 mg | Freq: Every day | RECTAL | Status: DC | PRN

## 2020-09-06 MED ORDER — ALBUTEROL SULFATE (2.5 MG/3ML) 0.083% IN NEBU: 2.5000 mg | INHALATION_SOLUTION | RESPIRATORY_TRACT | Status: DC | PRN

## 2020-09-06 MED ORDER — INSULIN ASPART 100 UNIT/ML ~~LOC~~ SOLN
0.0000 [IU] | Freq: Three times a day (TID) | SUBCUTANEOUS | Status: DC
  Administered 2020-09-06: 1 [IU] via SUBCUTANEOUS
  Administered 2020-09-07: 2 [IU] via SUBCUTANEOUS
  Administered 2020-09-07: 3 [IU] via SUBCUTANEOUS
  Administered 2020-09-07 – 2020-09-08 (×2): 1 [IU] via SUBCUTANEOUS
  Administered 2020-09-08: 7 [IU] via SUBCUTANEOUS
  Administered 2020-09-08: 2 [IU] via SUBCUTANEOUS
  Administered 2020-09-09: 3 [IU] via SUBCUTANEOUS
  Administered 2020-09-09 (×2): 1 [IU] via SUBCUTANEOUS
  Administered 2020-09-10: 13:00:00 3 [IU] via SUBCUTANEOUS
  Administered 2020-09-10 (×2): 1 [IU] via SUBCUTANEOUS
  Administered 2020-09-11: 07:00:00 2 [IU] via SUBCUTANEOUS
  Administered 2020-09-11: 18:00:00 1 [IU] via SUBCUTANEOUS
  Administered 2020-09-11: 12:00:00 2 [IU] via SUBCUTANEOUS
  Administered 2020-09-12 (×2): 1 [IU] via SUBCUTANEOUS
  Administered 2020-09-12 – 2020-09-13 (×2): 2 [IU] via SUBCUTANEOUS
  Administered 2020-09-14: 18:00:00 1 [IU] via SUBCUTANEOUS
  Administered 2020-09-15: 10:00:00 2 [IU] via SUBCUTANEOUS

## 2020-09-06 MED ORDER — ATORVASTATIN CALCIUM 10 MG PO TABS
20.0000 mg | ORAL_TABLET | Freq: Every day | ORAL | Status: DC
  Administered 2020-09-06 – 2020-09-14 (×8): 20 mg via ORAL
  Filled 2020-09-06 (×9): qty 2

## 2020-09-06 NOTE — Discharge Summary (Addendum)
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 HTN Active Problems:   Diabetes mellitus, type II ([place])   HTN (hypertension)   Hyperlipemia   Migraines   Medications to be continued on Rehab Allergies as of 09/06/2020       Reactions   Compazine Other (See Comments)   Bad headache, vomiting; IV    Cymbalta [duloxetine Hcl] Other (See Comments)   "Kept me awake"   Methadone Hives   Escitalopram Anxiety   Macrobid [nitrofurantoin Monohyd Macro] Rash        Medication List     STOP taking these medications    INSULIN SYRINGE 1CC/31GX5/16" 31G X 5/16" 1 ML Misc   olmesartan 40 MG tablet Commonly known as: BENICAR   predniSONE 50 MG tablet Commonly known as: DELTASONE       TAKE these medications    albuterol 108 (90 Base) MCG/ACT inhaler Commonly known as: VENTOLIN HFA Inhale 2 puffs into the lungs every 4 (four) hours as needed for wheezing or shortness of breath.   amLODipine 5 MG tablet Commonly known as: NORVASC Take 1 tablet (5 mg total) by mouth daily. What changed: when to take this   atorvastatin 20 MG tablet Commonly known as: LIPITOR Take 1 tablet (20 mg total) by mouth daily at 6 PM. What changed:  medication strength how much to take   butorphanol 10 MG/ML nasal spray Commonly known as: STADOL PLACE 1 SPRAY INTO THE NOSE EVERY 4 HOURS AS NEEDED FOR MIGRAINE. What changed:  how much to take how to take this when to take this reasons to take this   glipiZIDE 10 MG tablet Commonly known as: GLUCOTROL TAKE 2 TABLETS (20 MG TOTAL) BY MOUTH 2 (TWO) TIMES DAILY  BEFORE A MEAL. What changed:  how much to take when to take this   insulin aspart 100 UNIT/ML injection Commonly known as: novoLOG Inject 0-9 Units into the skin 3 (three) times daily with meals.   insulin NPH Human 100 UNIT/ML injection Commonly known as: NOVOLIN N Inject 0.05 mLs (5 Units total) into the skin 2 (two) times daily at 8 am and 10 pm. What changed: when to take this   levothyroxine 75 MCG tablet Commonly known as: SYNTHROID Take 1 tablet (75 mcg total) by mouth daily.   PHENobarbital 97.2 MG tablet Commonly known as: LUMINAL TAKE 2 TABLETS BY MOUTH ONCE DAILY ON MON,WED,AND FRI AND 1 TABLET DAILY ON OTHER DAYS What changed:  how much to take how to take this when to take this additional instructions   senna-docusate 8.6-50 MG tablet Commonly known as: Senokot-S Take 1 tablet by mouth 2 (two) times daily.   tiZANidine 4 MG tablet Commonly known as: ZANAFLEX TAKE 1 TABLET BY MOUTH EVERY 4-6 HOURS AS NEEDED FOR MUSCLE SPASM What changed: See the new instructions.   zolpidem 10 MG tablet Commonly known as: AMBIEN take 1 tablet by mouth at bedtime for sleep if needed. What changed:  how much to take how to take this when to take this reasons to take this  LABORATORY STUDIES CBC    Component Value Date/Time   WBC 5.9 09/06/2020 0401   RBC 3.56 (L) 09/06/2020 0401   HGB 10.4 (L) 09/06/2020 0401   HGB 11.0 (L) 10/08/2018 1504   HCT 33.4 (L) 09/06/2020 0401   HCT 33.6 (L) 10/08/2018 1504   PLT 224 09/06/2020 0401   PLT 278 10/08/2018 1504   MCV 93.8 09/06/2020 0401   MCV 91.2 12/01/2018 1624   MCV 93 10/08/2018 1504   MCH 29.2 09/06/2020 0401   MCHC 31.1 09/06/2020 0401   RDW 14.5 09/06/2020 0401   RDW 13.1 10/08/2018 1504   LYMPHSABS 1.1 09/02/2020 1029   LYMPHSABS 1.6 10/08/2018 1504   MONOABS 0.9 09/02/2020 1029   EOSABS 0.1 09/02/2020 1029   EOSABS 0.2 10/08/2018 1504   BASOSABS 0.0 09/02/2020 1029   BASOSABS 0.1 10/08/2018  1504   CMP    Component Value Date/Time   NA 141 09/06/2020 0401   NA 141 12/01/2018 1653   K 4.1 09/06/2020 0401   CL 106 09/06/2020 0401   CO2 24 09/06/2020 0401   GLUCOSE 145 (H) 09/06/2020 0401   BUN 25 (H) 09/06/2020 0401   BUN 21 12/01/2018 1653   CREATININE 1.31 (H) 09/06/2020 0401   CREATININE 1.13 (H) 07/12/2016 1146   CALCIUM 9.6 09/06/2020 0401   PROT 6.9 09/02/2020 1029   PROT 6.2 12/01/2018 1653   ALBUMIN 3.8 09/02/2020 1029   ALBUMIN 4.2 12/01/2018 1653   AST 21 09/02/2020 1029   ALT 16 09/02/2020 1029   ALKPHOS 88 09/02/2020 1029   BILITOT 0.5 09/02/2020 1029   BILITOT <0.2 12/01/2018 1653   GFRNONAA 44 (L) 09/06/2020 0401   GFRNONAA 37 (L) 06/21/2016 1209   GFRAA 46 (L) 09/07/2019 1000   GFRAA 43 (L) 06/21/2016 1209   COAGS Lab Results  Component Value Date   INR 1.0 09/02/2020   INR 1.04 03/14/2018   INR 0.98 03/14/2018   Lipid Panel    Component Value Date/Time   CHOL 154 09/02/2020 1029   CHOL 159 10/08/2018 1504   TRIG 107 09/02/2020 1029   HDL 64 09/02/2020 1029   HDL 75 10/08/2018 1504   CHOLHDL 2.4 09/02/2020 1029   VLDL 21 09/02/2020 1029   LDLCALC 69 09/02/2020 1029   LDLCALC 65 10/08/2018 1504   HgbA1C  Lab Results  Component Value Date   HGBA1C 8.1 (H) 09/02/2020   Alcohol Level    Component Value Date/Time   ETH <10 09/02/2020 1029    SIGNIFICANT DIAGNOSTIC STUDIES CT HEAD WO CONTRAST 09/02/2020 Unchanged appearance of right basal ganglia intraparenchymal hemorrhage.    CT HEAD WO CONTRAST 09/02/2020 1. 2 cc acute hematoma in the right corona radiata. There is early extension into the right lateral ventricle.  2. Background chronic small vessel disease.    CT CERVICAL SPINE WO CONTRAST 09/02/2020 No acute finding in the cervical spine.    MR BRAIN W WO CONTRAST 09/02/2020 Acute parenchymal hemorrhage centered in the right corona radiata with mild surrounding edema and trace intraventricular extension. No evidence of  underlying lesion. Few additional chronic microhemorrhages likely reflecting sequelae of hypertension. Mild to moderate chronic microvascular ischemic changes. Chronic small vessel infarcts.    DG Shoulder Left 09/02/2020 Mildly limited study with no evidence of fracture or dislocation.    Transthoracic Echocardiogram  09/04/2020 1. Left ventricular ejection fraction, by estimation, is 60 to 65%. The left ventricle has normal function. Left ventricular endocardial border not optimally defined to evaluate regional  wall motion. Left ventricular diastolic parameters are consistent with Grade I diastolic dysfunction (impaired relaxation).   2. Right ventricular systolic function is normal. The right ventricular size is normal. Tricuspid regurgitation signal is inadequate for assessing PA pressure.   3. The mitral valve is normal in structure. No evidence of mitral valve regurgitation. No evidence of mitral stenosis.   4. The aortic valve is grossly normal. Aortic valve regurgitation is not visualized. No aortic stenosis is present.   5. The inferior vena cava is normal in size with greater than 50% respiratory variability, suggesting right atrial pressure of 3 mmHg.    ECG - SR rate 89 BPM. (See cardiology reading for complete details)      HISTORY OF PRESENT ILLNESS Natalie [name] is a 69 y.o. female past medical history of chronic headaches chronic migraine, chronic pain syndrome, hypertension, hyperlipidemia, type 2 diabetes, seizures since the age of 30, who presented to the emergency room for evaluation after a near fall where her left leg gave out.  She says that she has numbness in both lower extremities for a long time but she felt that her left leg was weaker and gave out yesterday.  Last known normal was somewhere around 8 or 9 PM yesterday 09/01/2020. She also was noted to have left-sided facial droop by family yesterday but that has since resolved.  Speech might also be a little slurred  although family says it is normal. Reports a mild headache.  Denies any visual changes.  Denies any other recent illnesses or sicknesses.  Denies any sick contacts.  Denies chest pain shortness of breath nausea vomiting. Noncontrast head CT completed in the emergency room showed 2 cc acute ICH in the right corona radiata.  Early extension into the right lateral ventricle. Neurology was consulted for admission. Patient denies any preceding seizures. Premorbid modified Rankin scale (mRS): 0. ICH score 1.    HOSPITAL COURSE Natalie [name] is a 69 y.o. female with history of chronic headaches chronic migraine, chronic numbness BLE, chronic pain syndrome, CKD, hypertension, hyperlipidemia, type 2 diabetes, and seizures since the age of 50, presenting with left sided weakness and mild headache.    Stroke: acute R corona radiata IPH with mild surrounding edema and trace IVH-etiology likely hypertensive CT head -  2 cc acute hematoma in the right corona radiata. There is early extension into the right lateral ventricle. Background chronic small vessel disease.  CT head repeat - Unchanged appearance of right basal ganglia intraparenchymal hemorrhage.  MRI head - Acute R corona radiata IPH with mild surrounding edema and trace IVH. No evidence of underlying lesion. Few chronic microhemorrhages. Mild to moderate chronic microvascular ischemic changes. Chronic small vessel infarcts CTA H&N -no large vessel stenosis or occlusion no aneurysm or AVM. 2D Echo - EF 60-65%. No source of embolus   [employer] Virus 2 - negative LDL - 69 HgbA1c - 8.1 UDS - (not collected)  VTE prophylaxis - SCDs No antithrombotic prior to admission, now on No antithrombotic ICH Therapy recommendations:  CIR  Disposition:  CIR   Hypertension Home BP meds: Norvasc ; Benicar Current BP meds: norvasc 5  SBP goal < 160 in hospital Stable  Long-term BP goal normotensive   Hyperlipidemia Home Lipid lowering medication:  Lipitor  40 mg daily  LDL 69, goal < 70 Current lipid lowering medication: Lipitor 40 mg daily - will D/C given LDL less than goal in setting of hemorrhage  Continue half dose statin (Lipitor  20) at discharge   Diabetes Home diabetic meds: insulin ; Glipizide Current diabetic meds: glipizide, SSI, NPH 5u bid HgbA1c 8.1, goal < 7.0  Other Stroke Risk Factors Advanced age Obesity, Body mass index is 38.35 kg/m., recommend weight loss, diet and exercise as appropriate  Migraines Previous strokes by imaging   Other Active Problems CKD - stage 3a - creatinine - 1.11->1.31 Seizure hx - on phenobarbital (phenobarbital level 27.1)    DISCHARGE EXAM Blood pressure 107/79, pulse 85, temperature 98.6 F (37 C), temperature source Oral, resp. rate 17, height 4\' 8"  (1.422 m), weight 77.6 kg, SpO2 96 %. Pleasant obese elderly lady not in distress. . Afebrile. Head is nontraumatic. Neck is supple without bruit. Cardiac exam no murmur or gallop. Lungs are clear to auscultation. Distal pulses are well felt. Neurological exam Awake alert oriented to time place and person. Diminished attention, registration and recall. Follows simple midline and one-step commands well. No dysarthria or aphasia. Extraocular movements are full range without nystagmus. Blinks to threat bilaterally. Mild left lower facial asymmetry. Tongue midline. Motor system exam mild left hemiparesis 4+/5 strength with very subtle left drift. Fine finger movements are diminished on the left. Orbits right over left upper extremity. Left grip slightly weak compared to the right. Sensation appears intact. Gait not tested.   Discharge Diet  Heart healthy / carb modified thin liquids  DISCHARGE PLAN Disposition:  Transfer to [place] for ongoing PT, OT and ST Due to hemorrhage and risk of bleeding, do not take aspirin, aspirin-containing medications, or ibuprofen products  Recommend ongoing stroke risk factor control by Primary  Care Physician at time of discharge from inpatient rehabilitation. Follow-up PCP Forrest Moron, MD in 2 weeks following discharge from rehab. Follow-up in [facility] in 4 weeks following discharge from rehab, office to schedule an appointment.   35 minutes were spent preparing discharge.  Burnetta Sabin, MSN, APRN, ANVP-BC, AGPCNP-BC Advanced Practice Stroke Nurse [place] for Schedule & Pager information 09/06/2020 1:33 PM    I have personally obtained history,examined this patient, reviewed notes, independently viewed imaging studies, participated in medical decision making and plan of care.ROS completed by me personally and pertinent positives fully documented  I have made any additions or clarifications directly to the above note. Agree with note above.    Antony Contras, MD Medical Director [facility] Pager: [phone] 09/06/2020 4:13 PM

## 2020-09-06 NOTE — Progress Notes (Signed)
Patient admitted to floor from 3W via wheelchair by staff. Patient oriented to floor and made aware of plan. Call bell within reach and bed in lowest position. Will continue to monitor,

## 2020-09-06 NOTE — Progress Notes (Signed)
Natalie Blake, MD  Physician  Physical Medicine and Rehabilitation  Consult Note     Signed  Date of Service:  09/05/2020 12:20 PM      Related encounter: ED to Hosp-Admission (Discharged) from 09/02/2020 in [facility]      Signed      Expand All Collapse All  Show:Clear all [x] Manual[x] Template[] Copied  Added by: [x] Kirsteins, Luanna Salk, MD[x] Love, Ivan Anchors, PA-C  [] Hover for details          Physical Medicine and Rehabilitation Consult     Reason for Consult: [place] with functional deficits. Referring Physician: Dr. Leonie Man     HPI: Natalie [name] is a 69 y.o. RH-female with history of seizure disorder, migraines, CKD, T2DM with peripheral neuropathy, chronic pain, chronic UTIs who was admitted on 09/02/20 with acute onset of left sided weakness with left facial droop and mild slurred speech. CT head [name] showing 2 cc acute hematoma in right corona radiata with early extension into right lateral ventricle.  Follow up CT head showed stable bleed. MRI brain showed IPH without underlying lesions and few chronic microhemorrhages as well as mild to moderate chronic ischemic changes. CTA head/neck was negative for LVO, aneurysm or occlusion. Bleed felt to be hypertensive in nature with SBP goal<140. 2D echo showed EF 60-65%. Therapy evaluations completed revealing left sided weakness affecting ADLs and mobility. CIR recommended due to functional decline.      Review of Systems  Constitutional: Negative for chills and fever.  HENT: Negative for hearing loss.   Eyes: Negative for blurred vision, double vision and photophobia.  Respiratory: Negative for shortness of breath and wheezing.   Cardiovascular: Negative for chest pain and palpitations.  Gastrointestinal: Negative for heartburn and nausea.  Genitourinary: Negative for dysuria and urgency.  Musculoskeletal: Negative for back pain, joint pain and myalgias.  Skin: Negative for itching.  Neurological:  Positive for sensory change and headaches. Negative for dizziness.  Psychiatric/Behavioral: Negative for memory loss.          Past Medical History:  Diagnosis Date  . Arthritis    . Atrophy of left kidney    . Cervicalgia    . Chronic kidney disease, stage 3 ([place])      acute aki 03-14-2018 in setting pyelonehritis-- resolved  . Chronic migraine    . Chronic pain syndrome    . History of kidney stones    . History of pyelonephritis      hx recurrent  . History of recurrent UTIs    . History of sepsis      07/ 2017 secondary to pyelonephritis  . Hypertension    . Hypothyroidism    . Iron deficiency anemia    . Left ureteral stone    . Mixed hyperlipidemia    . Nephrolithiasis      right side nonobstructive per CT 03-14-2018  . Right shoulder pain      post sx 02-18-2018  . Seizure disorder ([place]) followed by pcp    per pt dx age 66--  no seizure since 1980's , controlled w/ phenobarbitol  . Type 2 diabetes mellitus ([place])      followed by pcp  . Wears glasses             Past Surgical History:  Procedure Laterality Date  . CESAREAN SECTION   1974  . CHOLECYSTECTOMY OPEN   2002  . CYSTO/  LEFT RETROGRADE PYELOGRAM   06-11-2006   dr Jeffie Pollock  [facility]  .  CYSTOSCOPY WITH RETROGRADE PYELOGRAM, URETEROSCOPY AND STENT PLACEMENT Left 04/10/2018    Procedure: CYSTOSCOPY WITH RETROGRADE PYELOGRAM, URETEROSCOPY AND STENT PLACEMENT;  Surgeon: Franchot Gallo, MD;  Location: [facility];  Service: Urology;  Laterality: Left;  . FOOT ARTHRODESIS Right 09/10/2019    Procedure: ARTHRODESIS FOOT;  Surgeon: Erle Crocker, MD;  Location: [place];  Service: Orthopedics;  Laterality: Right;  . IR URETERAL STENT PLACEMENT EXISTING ACCESS RIGHT   08-21-2007    dr Karsten Ro  [facility]  . LEFT RETROGRADE PYELOGRAM/ LEFT URETERAL STENT PLACEMENT   02-25-2001   dr Jeffie Pollock [facility]  . NEPHROLITHOTOMY Right 05/14/2016    Procedure: NEPHROLITHOTOMY PERCUTANEOUS;  Surgeon:  Franchot Gallo, MD;  Location: [facility];  Service: Urology;  Laterality: Right;  . OPEN REDUCTION INTERNAL FIXATION (ORIF) FOOT LISFRANC FRACTURE Right 09/10/2019    Procedure: OPEN REDUCTION INTERNAL FIXATION (ORIF) FOOT LISFRANC FRACTURE;  Surgeon: Erle Crocker, MD;  Location: [place];  Service: Orthopedics;  Laterality: Right;  . REPAIR EXTENSOR TENDON WITH METATARSAL OSTEOTOMY AND OPEN REDUCTION IN Right 09/10/2019    Procedure: OPEN TREATMENT RIGHT FIRST, SECOND, THIRD TARSOMETATARSAL LISFRANC, MIDFOOT FUSION;  Surgeon: Erle Crocker, MD;  Location: [place];  Service: Orthopedics;  Laterality: Right;  . SHOULDER ARTHROSCOPY Bilateral left 11-13-2000;  right 02-18-2018           Family History  Problem Relation Age of Onset  . Heart disease Father    . Diabetes Father        Social History:  Natalie [name] works but is off this week and daughter can assist at discharge.  She reports that she has never smoked. She has never used smokeless tobacco. She reports that she does not drink alcohol and does not use drugs.           Allergies  Allergen Reactions  . Compazine Other (See Comments)      Bad headache, vomiting; IV   . Cymbalta [Duloxetine Hcl] Other (See Comments)      "Kept me awake"  . Methadone Hives  . Escitalopram Anxiety  . Macrobid [Nitrofurantoin Monohyd Macro] Rash            Medications Prior to Admission  Medication Sig Dispense Refill  . albuterol (VENTOLIN HFA) 108 (90 Base) MCG/ACT inhaler Inhale 2 puffs into the lungs every 4 (four) hours as needed for wheezing or shortness of breath. 18 g 0  . amLODipine (NORVASC) 5 MG tablet Take 1 tablet (5 mg total) by mouth daily. (Patient taking differently: Take 5 mg by mouth daily with supper. ) 90 tablet 3  . atorvastatin (LIPITOR) 40 MG tablet Take 1 tablet (40 mg total) by mouth daily at 6 PM. 90 tablet 1  . butorphanol (STADOL) 10 MG/ML nasal spray PLACE 1  SPRAY INTO THE NOSE EVERY 4 HOURS AS NEEDED FOR MIGRAINE. (Patient taking differently: Place 1 spray into the nose every 4 (four) hours as needed for headache or migraine. PLACE 1 SPRAY INTO THE NOSE EVERY 4 HOURS AS NEEDED FOR MIGRAINE.) 2.5 mL 5  . glipiZIDE (GLUCOTROL) 10 MG tablet TAKE 2 TABLETS (20 MG TOTAL) BY MOUTH 2 (TWO) TIMES DAILY BEFORE A MEAL. (Patient taking differently: Take 10 mg by mouth daily before breakfast. ) 360 tablet 0  . insulin NPH Human (HUMULIN N,NOVOLIN N) 100 UNIT/ML injection Inject 0.05 mLs (5 Units total) into the skin 2 (two) times daily before a meal. (Patient taking differently: Inject 25  Units into the skin 2 (two) times daily before a meal. ) 10 mL 0  . levothyroxine (SYNTHROID, LEVOTHROID) 75 MCG tablet Take 1 tablet (75 mcg total) by mouth daily. 90 tablet 1  . olmesartan (BENICAR) 40 MG tablet Take 1 tablet (40 mg total) by mouth daily. 90 tablet 0  . PHENobarbital (LUMINAL) 97.2 MG tablet TAKE 2 TABLETS BY MOUTH ONCE DAILY ON MON,WED,AND FRI AND 1 TABLET DAILY ON OTHER DAYS (Patient taking differently: Take 97.2-194.4 mg by mouth See admin instructions. TAKE 2 TABLETS (194.4 MG TOTALLY) BY MOUTH ON MON, FRI, SUN; TAKE 1 TABLET (97.2 MG TOTALLY) DAILY ON OTHER DAYS) 135 tablet 1  . tiZANidine (ZANAFLEX) 4 MG tablet TAKE 1 TABLET BY MOUTH EVERY 4-6 HOURS AS NEEDED FOR MUSCLE SPASM (Patient taking differently: Take 4 mg by mouth every 4 (four) hours as needed for muscle spasms. ) 180 tablet 1  . zolpidem (AMBIEN) 10 MG tablet take 1 tablet by mouth at bedtime for sleep if needed. (Patient taking differently: Take 10 mg by mouth at bedtime as needed for sleep. take 1 tablet by mouth at bedtime for sleep if needed.) 90 tablet 1  . Insulin Syringe-Needle U-100 (INSULIN SYRINGE 1CC/31GX5/16") 31G X 5/16" 1 ML MISC 1 Units by Does not apply route as directed. Dx: E11.29 200 each 3  . predniSONE (DELTASONE) 50 MG tablet Take 1 tablet (50 mg total) by mouth daily with  breakfast. (Patient not taking: Reported on 09/02/2020) 5 tablet 0      Home: Home Living Family/patient expects to be discharged to:: Private residence Living Arrangements: Spouse/significant other, Children Available Help at Discharge: Family, Other (Comment) (reports spouse can likely take off work) Type of Home: House Home Access: Stairs to enter [employer] of Steps: 4 (previously had ramp, spouse states he can reinstall ramp if needed) Entrance Stairs-Rails: None Home Layout: One level Bathroom Shower/Tub: Multimedia programmer: Standard Home Equipment: Environmental consultant - 2 wheels, Environmental consultant - 4 wheels, [place] - single point, Grab bars - tub/shower, Grab bars - toilet  Lives With: Family (daughers are paramedics)  Functional History: Prior Function Level of Independence: Independent Comments: regained independence following injury last year requiring use of AD Functional Status:  Mobility: Bed Mobility Overal bed mobility: Needs Assistance Bed Mobility: Supine to Sit Supine to sit: Min guard General bed mobility comments: for balance, lines and safety Transfers Overall transfer level: Needs assistance Equipment used: 1 person hand held assist Transfers: Sit to/from Stand, Stand Pivot Transfers Sit to Stand: Min assist Stand pivot transfers: Mod assist General transfer comment: no noted buckling in LLE however pt having difficulty with stepping LLE, somewhat dragging LE during transitions, heavy reliance on UE support reaching for arms of BSC start of transfer; stand pivot to [facility] and then few steps to recliner  Ambulation/Gait Ambulation/Gait assistance: Min assist, +2 physical assistance Gait Distance (Feet): 7 Feet Assistive device: 2 person hand held assist Gait Pattern/deviations: Step-to pattern, Decreased stride length General Gait Details: small forward steps with chair follow. increased assist to steady with stance on LLE, but pt able to advance BLE without  assist Gait velocity: decreased Gait velocity interpretation: <1.31 ft/sec, indicative of household ambulator   ADL: ADL Overall ADL's : Needs assistance/impaired Eating/Feeding: Set up, Sitting Grooming: Wash/dry hands, Set up, Sitting Upper Body Bathing: Min guard, Sitting Lower Body Bathing: Moderate assistance, Sit to/from stand Upper Body Dressing : Min guard, Sitting Lower Body Dressing: Moderate assistance, Sit to/from stand Toilet Transfer: Moderate  assistance, Stand-pivot, BSC Toileting- Clothing Manipulation and Hygiene: Moderate assistance, Sit to/from stand Toileting - Clothing Manipulation Details (indicate cue type and reason): assist for balance in standing and gown management while pt completing anterior pericare  Functional mobility during ADLs: Moderate assistance (stand pivot, few steps BSC to recliner )   Cognition: Cognition Overall Cognitive Status: Within Functional Limits for tasks assessed Orientation Level: Oriented X4 Cognition Arousal/Alertness: Awake/alert Behavior During Therapy: WFL for tasks assessed/performed Overall Cognitive Status: Within Functional Limits for tasks assessed General Comments: not formally assessed but seems [facility] for basic tasks      Blood pressure (!) 154/64, pulse 89, temperature 98.9 F (37.2 C), temperature source Oral, resp. rate 12, height 4\' 8"  (1.422 m), weight 77.6 kg, SpO2 97 %. Physical Exam Vitals and nursing note reviewed.  Constitutional:      Appearance: Normal appearance.  HENT:     Head: Atraumatic.     Nose: Nose normal.  Eyes:     General: No visual field deficit.    Extraocular Movements: Extraocular movements intact.     Conjunctiva/sclera: Conjunctivae normal.     Pupils: Pupils are equal, round, and reactive to light.  Cardiovascular:     Rate and Rhythm: Normal rate and regular rhythm.     Heart sounds: Normal heart sounds. No murmur heard.   Pulmonary:     Effort: Pulmonary effort is normal. No  respiratory distress.     Breath sounds: Normal breath sounds. No stridor.  Abdominal:     General: Abdomen is flat. Bowel sounds are normal. There is no distension.     Palpations: Abdomen is soft.     Tenderness: There is no abdominal tenderness.  Musculoskeletal:     Right lower leg: No edema.     Comments: No pain with UE or LE ROM  Skin:    General: Skin is warm and dry.  Neurological:     Mental Status: She is alert and oriented to person, place, and time.     Cranial Nerves: Dysarthria present. No facial asymmetry.     Sensory: Sensation is intact.     Motor: Weakness present.     Comments: Left facial weakness without dysarthria.  Motor 5/5 in BUE 4/5 R HF and L HF 5/5 R quad, 4/5 L quad 4/5 B nd kle DF       Lab Results Last 24 Hours       Results for orders placed or performed during the hospital encounter of 09/02/20 (from the past 24 hour(s))  Glucose, capillary     Status: Abnormal    Collection Time: 09/04/20  6:10 PM  Result Value Ref Range    Glucose-Capillary 151 (H) 70 - 99 mg/dL  Glucose, capillary     Status: Abnormal    Collection Time: 09/04/20  9:21 PM  Result Value Ref Range    Glucose-Capillary 198 (H) 70 - 99 mg/dL    Comment 1 Notify RN      Comment 2 Document in Chart    Glucose, capillary     Status: Abnormal    Collection Time: 09/05/20  9:19 AM  Result Value Ref Range    Glucose-Capillary 178 (H) 70 - 99 mg/dL  Glucose, capillary     Status: Abnormal    Collection Time: 09/05/20 12:13 PM  Result Value Ref Range    Glucose-Capillary 325 (H) 70 - 99 mg/dL       Imaging Results (Last 48 hours)  CT ANGIO HEAD  W OR WO CONTRAST   Result Date: 09/04/2020 CLINICAL DATA:  Stroke suspected EXAM: CT ANGIOGRAPHY HEAD AND NECK TECHNIQUE: Multidetector CT imaging of the head and neck was performed using the standard protocol during bolus administration of intravenous contrast. Multiplanar CT image reconstructions and MIPs were obtained to evaluate  the vascular anatomy. Carotid stenosis measurements (when applicable) are obtained utilizing NASCET criteria, using the distal internal carotid diameter as the denominator. CONTRAST:  68mL OMNIPAQUE IOHEXOL 350 MG/ML SOLN COMPARISON:  None. FINDINGS: CT HEAD FINDINGS Brain: Unchanged focus of hemorrhage in the posterior right basal ganglia. Generalized volume loss. There is hypoattenuation of the periventricular white matter, most commonly indicating chronic ischemic microangiopathy. Skull: The visualized skull base, calvarium and extracranial soft tissues are normal. Sinuses/Orbits: No fluid levels or advanced mucosal thickening of the visualized paranasal sinuses. No mastoid or middle ear effusion. The orbits are normal. CTA NECK FINDINGS SKELETON: There is no bony spinal canal stenosis. No lytic or blastic lesion. OTHER NECK: Normal pharynx, larynx and major salivary glands. No cervical lymphadenopathy. Unremarkable thyroid gland. UPPER CHEST: No pneumothorax or pleural effusion. No nodules or masses. AORTIC ARCH: There is no calcific atherosclerosis of the aortic arch. There is no aneurysm, dissection or hemodynamically significant stenosis of the visualized portion of the aorta. Conventional 3 vessel aortic branching pattern. The visualized proximal subclavian arteries are widely patent. RIGHT CAROTID SYSTEM: Normal without aneurysm, dissection or stenosis. LEFT CAROTID SYSTEM: Normal without aneurysm, dissection or stenosis. VERTEBRAL ARTERIES: Left dominant configuration. Both origins are clearly patent. There is no dissection, occlusion or flow-limiting stenosis to the skull base (V1-V3 segments). CTA HEAD FINDINGS POSTERIOR CIRCULATION: --Vertebral arteries: Normal V4 segments. --Inferior cerebellar arteries: Normal. --Basilar artery: Normal. --Superior cerebellar arteries: Normal. --Posterior cerebral arteries (PCA): Normal. ANTERIOR CIRCULATION: --Intracranial internal carotid arteries: Normal. --Anterior  cerebral arteries (ACA): Normal. Both A1 segments are present. Patent anterior communicating artery (a-comm). --Middle cerebral arteries (MCA): Normal. VENOUS SINUSES: As permitted by contrast timing, patent. ANATOMIC VARIANTS: None Review of the MIP images confirms the above findings. IMPRESSION: 1. No emergent large vessel occlusion or high-grade stenosis of the intracranial arteries. No aneurysm or vascular malformation. 2. Unchanged focus of hemorrhage in the posterior right basal ganglia. Aortic Atherosclerosis (ICD10-I70.0). Electronically Signed   By: Ulyses Jarred M.D.   On: 09/04/2020 04:55    CT ANGIO NECK W OR WO CONTRAST   Result Date: 09/04/2020 CLINICAL DATA:  Stroke suspected EXAM: CT ANGIOGRAPHY HEAD AND NECK TECHNIQUE: Multidetector CT imaging of the head and neck was performed using the standard protocol during bolus administration of intravenous contrast. Multiplanar CT image reconstructions and MIPs were obtained to evaluate the vascular anatomy. Carotid stenosis measurements (when applicable) are obtained utilizing NASCET criteria, using the distal internal carotid diameter as the denominator. CONTRAST:  63mL OMNIPAQUE IOHEXOL 350 MG/ML SOLN COMPARISON:  None. FINDINGS: CT HEAD FINDINGS Brain: Unchanged focus of hemorrhage in the posterior right basal ganglia. Generalized volume loss. There is hypoattenuation of the periventricular white matter, most commonly indicating chronic ischemic microangiopathy. Skull: The visualized skull base, calvarium and extracranial soft tissues are normal. Sinuses/Orbits: No fluid levels or advanced mucosal thickening of the visualized paranasal sinuses. No mastoid or middle ear effusion. The orbits are normal. CTA NECK FINDINGS SKELETON: There is no bony spinal canal stenosis. No lytic or blastic lesion. OTHER NECK: Normal pharynx, larynx and major salivary glands. No cervical lymphadenopathy. Unremarkable thyroid gland. UPPER CHEST: No pneumothorax or  pleural effusion. No nodules or masses. AORTIC ARCH:  There is no calcific atherosclerosis of the aortic arch. There is no aneurysm, dissection or hemodynamically significant stenosis of the visualized portion of the aorta. Conventional 3 vessel aortic branching pattern. The visualized proximal subclavian arteries are widely patent. RIGHT CAROTID SYSTEM: Normal without aneurysm, dissection or stenosis. LEFT CAROTID SYSTEM: Normal without aneurysm, dissection or stenosis. VERTEBRAL ARTERIES: Left dominant configuration. Both origins are clearly patent. There is no dissection, occlusion or flow-limiting stenosis to the skull base (V1-V3 segments). CTA HEAD FINDINGS POSTERIOR CIRCULATION: --Vertebral arteries: Normal V4 segments. --Inferior cerebellar arteries: Normal. --Basilar artery: Normal. --Superior cerebellar arteries: Normal. --Posterior cerebral arteries (PCA): Normal. ANTERIOR CIRCULATION: --Intracranial internal carotid arteries: Normal. --Anterior cerebral arteries (ACA): Normal. Both A1 segments are present. Patent anterior communicating artery (a-comm). --Middle cerebral arteries (MCA): Normal. VENOUS SINUSES: As permitted by contrast timing, patent. ANATOMIC VARIANTS: None Review of the MIP images confirms the above findings. IMPRESSION: 1. No emergent large vessel occlusion or high-grade stenosis of the intracranial arteries. No aneurysm or vascular malformation. 2. Unchanged focus of hemorrhage in the posterior right basal ganglia. Aortic Atherosclerosis (ICD10-I70.0). Electronically Signed   By: Ulyses Jarred M.D.   On: 09/04/2020 04:55    ECHOCARDIOGRAM COMPLETE   Result Date: 09/04/2020    ECHOCARDIOGRAM REPORT   Patient Name:   BAILI STANG Date of Exam: 09/04/2020 Medical Rec #:  [id number]     Height:       56.0 in Accession #:    [id number]    Weight:       171.1 lb Date of Birth:  28-May-1951     BSA:          1.661 m Patient Age:    32 years      BP:           156/77 mmHg Patient Gender: F              HR:           85 bpm. Exam Location:  Inpatient Procedure: 2D Echo, Color Doppler, Cardiac Doppler and Intracardiac            Opacification Agent Indications:    Stroke 434.91 / I163.9  History:        Patient has no prior history of Echocardiogram examinations.                 Risk Factors:Hypertension, Diabetes and Dyslipidemia. Chronic                 kidney disease, history of seizures.  Sonographer:    [name] RDCS Referring Phys: [id number] ASHISH ARORA IMPRESSIONS  1. Left ventricular ejection fraction, by estimation, is 60 to 65%. The left ventricle has normal function. Left ventricular endocardial border not optimally defined to evaluate regional wall motion. Left ventricular diastolic parameters are consistent with Grade I diastolic dysfunction (impaired relaxation).  2. Right ventricular systolic function is normal. The right ventricular size is normal. Tricuspid regurgitation signal is inadequate for assessing PA pressure.  3. The mitral valve is normal in structure. No evidence of mitral valve regurgitation. No evidence of mitral stenosis.  4. The aortic valve is grossly normal. Aortic valve regurgitation is not visualized. No aortic stenosis is present.  5. The inferior vena cava is normal in size with greater than 50% respiratory variability, suggesting right atrial pressure of 3 mmHg. Conclusion(s)/Recommendation(s): No intracardiac source of embolism detected on this transthoracic study. A transesophageal echocardiogram is recommended to exclude cardiac source of embolism if clinically  indicated. FINDINGS  Left Ventricle: Left ventricular ejection fraction, by estimation, is 60 to 65%. The left ventricle has normal function. Left ventricular endocardial border not optimally defined to evaluate regional wall motion. Definity contrast agent was given IV to delineate the left ventricular endocardial borders. The left ventricular internal cavity size was normal in size. There is no left  ventricular hypertrophy. Left ventricular diastolic parameters are consistent with Grade I diastolic dysfunction (impaired relaxation). Right Ventricle: The right ventricular size is normal. No increase in right ventricular wall thickness. Right ventricular systolic function is normal. Tricuspid regurgitation signal is inadequate for assessing PA pressure. Left Atrium: Left atrial size was normal in size. Right Atrium: Right atrial size was normal in size. Pericardium: There is no evidence of pericardial effusion. Mitral Valve: The mitral valve is normal in structure. No evidence of mitral valve regurgitation. No evidence of mitral valve stenosis. Tricuspid Valve: The tricuspid valve is normal in structure. Tricuspid valve regurgitation is not demonstrated. No evidence of tricuspid stenosis. Aortic Valve: The aortic valve is grossly normal. Aortic valve regurgitation is not visualized. No aortic stenosis is present. Pulmonic Valve: The pulmonic valve was normal in structure. Pulmonic valve regurgitation is not visualized. No evidence of pulmonic stenosis. Aorta: The aortic root is normal in size and structure. Venous: The inferior vena cava is normal in size with greater than 50% respiratory variability, suggesting right atrial pressure of 3 mmHg. IAS/Shunts: The interatrial septum was not well visualized.  LEFT VENTRICLE PLAX 2D LVOT diam:     1.90 cm  Diastology LV SV:         61       LV e' medial:    4.70 cm/s LV SV Index:   37       LV E/e' medial:  17.2 LVOT Area:     2.84 cm LV e' lateral:   5.35 cm/s                         LV E/e' lateral: 15.1                          3D Volume EF                         LV 3D EDV:   67.68 ml                         LV 3D ESV:   30.87 ml RIGHT VENTRICLE RV S prime:     12.40 cm/s TAPSE (M-mode): 1.6 cm LEFT ATRIUM             Index       RIGHT ATRIUM          Index LA Vol (A2C):   25.8 ml 15.53 ml/m RA Area:     8.14 cm LA Vol (A4C):   30.0 ml 18.06 ml/m RA Volume:    15.40 ml 9.27 ml/m LA Biplane Vol: 28.6 ml 17.22 ml/m  AORTIC VALVE LVOT Vmax:   105.00 cm/s LVOT Vmean:  86.400 cm/s LVOT VTI:    0.215 m  AORTA Ao Root diam: 2.80 cm MITRAL VALVE MV Area (PHT): 3.08 cm     SHUNTS MV Decel Time: 246 msec     Systemic VTI:  0.22 m MV E velocity: 80.80 cm/s   Systemic Diam: 1.90 cm MV A velocity:  133.00 cm/s MV E/A ratio:  0.61 Cherlynn Kaiser MD Electronically signed by Cherlynn Kaiser MD Signature Date/Time: 09/04/2020/3:39:22 PM    Final          Assessment/Plan: Diagnosis: RIght corona radiata intracranial bleed 1. Does the need for close, 24 hr/day medical supervision in concert with the patient's rehab needs make it unreasonable for this patient to be served in a less intensive setting? Yes 2. Co-Morbidities requiring supervision/potential complications: [id number], peripheral neuropathy, hx sz d/o, chronic UTI 3. Due to bladder management, bowel management, safety, skin/wound care, disease management, medication administration, pain management and patient education, does the patient require 24 hr/day rehab nursing? Yes 4. Does the patient require coordinated care of a physician, rehab nurse, therapy disciplines of PT< OT< SLP to address physical and functional deficits in the context of the above medical diagnosis(es)? Yes Addressing deficits in the following areas: balance, endurance, locomotion, strength, transferring, bowel/bladder control, bathing, dressing, toileting, cognition, speech and psychosocial support 5. Can the patient actively participate in an intensive therapy program of at least 3 hrs of therapy per day at least 5 days per week? Yes 6. The potential for patient to make measurable gains while on inpatient rehab is good 7. Anticipated functional outcomes upon discharge from inpatient rehab are modified independent  with PT, modified independent with OT, modified independent with SLP. 8. Estimated rehab length of stay to reach the above functional  goals is: 7-10d 9. Anticipated discharge destination: Home 10. Overall Rehab/Functional Prognosis: excellent   RECOMMENDATIONS: This patient's condition is appropriate for continued rehabilitative care in the following setting: CIR Patient has agreed to participate in recommended program. Yes Note that insurance prior authorization may be required for reimbursement for recommended care.   Comment:      Bary Leriche, PA-C 09/05/2020    "I have personally performed a face to face diagnostic evaluation of this patient.  Additionally, I have reviewed and concur with the physician assistant's documentation above." Natalie [name] M.D. [facility] FAAPM&R (Neuromuscular Med) Diplomate Am Board of Electrodiagnostic Med Fellow Am Board of Interventional Pain            Revision History                          Routing History           Note Details  Author Natalie Blake, MD File Time 09/05/2020  4:00 PM  Author Type Physician Status Signed  Last Editor Natalie Blake, MD Service Physical Medicine and [place] # [id number] Admit Date 09/06/2020

## 2020-09-06 NOTE — Progress Notes (Signed)
Occupational Therapy Treatment Patient Details Name: MADDYX VALLIE MRN: [id number] DOB: May 04, 1951 Today's Date: 09/06/2020    History of present illness The pt is a 69 yo female presenting with L-sided facial droop and weakness. 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 PMH includes: chronic headaches, HTN, HLD, DM II, CKD III, recurrent UTIs, and seizures.    OT comments  Pt excited about CIR level therapy. Motivated by her great-granddaughter (2 months old). Pt today was min A for sit<>stand transfers with vc for safe hand placement, sink level grooming at min guard. Improved overall strength in LUE 4/5 but decreased proprioception of LUE and sensation remain. CIR remains appropriate and essential to maximize safety and independence in ADL and functional transfers.    Follow Up Recommendations  CIR    Equipment Recommendations  3 in 1 bedside commode;Other (comment) (defer to next venue)    Recommendations for Other Services      Precautions / Restrictions Precautions Precautions: Fall Restrictions Weight Bearing Restrictions: No       Mobility Bed Mobility Overal bed mobility: Needs Assistance Bed Mobility: Supine to Sit     Supine to sit: Min guard     General bed mobility comments: OOB in recliner at beginning and end of session  Transfers Overall transfer level: Needs assistance Equipment used: Rolling walker (2 wheeled) Transfers: Sit to/from Stand Sit to Stand: Min assist         General transfer comment: max directional verbal cues for safe hand placement with sit<>stand    Balance Overall balance assessment: Needs assistance Sitting-balance support: No upper extremity supported;Feet unsupported Sitting balance-Leahy Scale: Fair     Standing balance support: Bilateral upper extremity supported;During functional activity Standing balance-Leahy Scale: Poor Standing balance comment: reliant on UE  support/external assist                            ADL either performed or assessed with clinical judgement   ADL Overall ADL's : Needs assistance/impaired     Grooming: Oral care;Min guard;Standing;Brushing hair Grooming Details (indicate cue type and reason): sink level                 Toilet Transfer: Minimal assistance;Ambulation;RW           Functional mobility during ADLs: Minimal assistance;Rolling walker;Cueing for safety;Cueing for sequencing       Vision       Perception     Praxis      Cognition Arousal/Alertness: Awake/alert Behavior During Therapy: WFL for tasks assessed/performed Overall Cognitive Status: Within Functional Limits for tasks assessed Area of Impairment: Problem solving                             Problem Solving: Difficulty sequencing;Requires verbal cues;Requires tactile cues General Comments: pt requiring max directional verbal cues to wash hands at sink, find soap and paper towels, pt with increased delay in processing today        Exercises     Shoulder Instructions       General Comments VSS throughout session    Pertinent Vitals/ Pain       Pain Assessment: No/denies pain  Home Living  Prior Functioning/Environment              Frequency  Min 2X/week        Progress Toward Goals  OT Goals(current goals can now be found in the care plan section)  Progress towards OT goals: Progressing toward goals  Acute Rehab OT Goals Patient Stated Goal: return home OT Goal Formulation: With patient Time For Goal Achievement: 09/18/20 Potential to Achieve Goals: Good  Plan Discharge plan remains appropriate;Frequency remains appropriate    Co-evaluation                 AM-PAC OT "6 Clicks" Daily Activity     Outcome Measure   Help from another person eating meals?: A Little Help from another person taking care of  personal grooming?: A Little Help from another person toileting, which includes using toliet, bedpan, or urinal?: A Lot Help from another person bathing (including washing, rinsing, drying)?: A Lot Help from another person to put on and taking off regular upper body clothing?: A Little Help from another person to put on and taking off regular lower body clothing?: A Lot 6 Click Score: 15    End of Session Equipment Utilized During Treatment: Gait belt;Rolling walker  OT Visit Diagnosis: Other abnormalities of gait and mobility (R26.89);Hemiplegia and hemiparesis;Other symptoms and signs involving the nervous system (R29.898) Hemiplegia - Right/Left: Left Hemiplegia - dominant/non-dominant: Non-Dominant Hemiplegia - caused by: Nontraumatic intracerebral hemorrhage   Activity Tolerance Patient tolerated treatment well   Patient Left in chair;with call bell/phone within reach;with chair alarm set   Nurse Communication Mobility status        Time: 5183-4373 OT Time Calculation (min): 22 min  Charges: OT General Charges $OT Visit: 1 Visit OT Treatments $Self Care/Home Management : 8-22 mins  Jesse Sans OTR/L Acute Rehabilitation Services Pager: [phone] Office: [place] 09/06/2020, 3:29 PM

## 2020-09-06 NOTE — Progress Notes (Signed)
Inpatient Rehabilitation Medication Review by a Pharmacist  A complete drug regimen review was completed for this patient to identify any potential clinically significant medication issues.  Clinically significant medication issues were identified:  no  Check AMION for pharmacist assigned to patient if future medication questions/issues arise during this admission.  Pharmacist comments:   Time spent performing this drug regimen review (minutes):  5   Natalie [name] 09/06/2020 4:35 PM

## 2020-09-06 NOTE — Progress Notes (Signed)
Cristina Gong, RN  Rehab Admission Coordinator  Physical Medicine and Rehabilitation  PMR Pre-admission     Signed  Date of Service:  09/06/2020 11:20 AM      Related encounter: ED to Hosp-Admission (Discharged) from 09/02/2020 in [facility]      Signed       Show:Clear all [x] Manual[x] Template[x] Copied  Added by: [x] Cristina Gong, RN  [] Hover for details PMR Admission Coordinator Pre-Admission Assessment   Patient: Natalie [name] is an 69 y.o., female MRN: [id number] DOB: August 29, 1951 Height: 4\' 8"  (142.2 cm) Weight: 77.6 kg                                                                                                                                                  Insurance Information HMO:     PPO:      PCP:      IPA:      80/20:      OTHER:  PRIMARY: Medicare a and b      Policy#: [id number]      Subscriber: pt Benefits:  Phone #: passport one online     Name: 12/6 Eff. Date: 11/29/2013     Deduct: $1484      Out of Pocket Max: none      Life Max: none  CIR: 100%      SNF: 20 full days Outpatient: 80%     Co-Pay: 205 Home Health: 100%      Co-Pay: none DME: 80%     Co-Pay: 20% Providers: pt choice  SECONDARY: [employer]      Policy#: [id number] Financial Counselor:       Phone#:    The "Data Collection Information Summary" for patients in Inpatient Rehabilitation Facilities with attached "Privacy Act [place] Records" was provided and verbally reviewed with: Patient and Family   Emergency Contact Information         Contact Information     Name Relation Home Work Mobile    Tabak,Shelby Daughter [phone]        Laurence, Folz     [phone]       Current Medical History  Patient Admitting Diagnosis: ICH   History of Present Illness:o. 69 year old female with history of seizure disorder, migraines, CKD, T2DM with peripheral neuropathy, chronic pain, chronic UTIs who was admitted on 09/02/20 with acute onset of  left sided weakness with left facial droop and mild slurred speech. CT head done showing 2 cc acute hematoma in right corona radiata with early extension into right lateral ventricle.  Follow up CT head showed stable bleed. MRI brain showed IPH without underlying lesions and few chronic micro hemorrhages as well as mild to moderate chronic ischemic changes. CTA head/neck was negative for LVO, aneurysm or occlusion. Bleed felt to be hypertensive in nature with  SBP goal<140. 2D echo showed EF 60-65% .   Complete NIHSS TOTAL: 2 Glasgow Coma Scale Score: 15   Past Medical History      Past Medical History:  Diagnosis Date  . Arthritis    . Atrophy of left kidney    . Cervicalgia    . Chronic kidney disease, stage 3 ([place])      acute aki 03-14-2018 in setting pyelonehritis-- resolved  . Chronic migraine    . Chronic pain syndrome    . History of kidney stones    . History of pyelonephritis      hx recurrent  . History of recurrent UTIs    . History of sepsis      07/ 2017 secondary to pyelonephritis  . Hypertension    . Hypothyroidism    . Iron deficiency anemia    . Left ureteral stone    . Mixed hyperlipidemia    . Nephrolithiasis      right side nonobstructive per CT 03-14-2018  . Right shoulder pain      post sx 02-18-2018  . Seizure disorder ([place]) followed by pcp    per pt dx age 68--  no seizure since 1980's , controlled w/ phenobarbitol  . Type 2 diabetes mellitus ([place])      followed by pcp  . Wears glasses        Family History  family history includes Diabetes in her father; Heart disease in her father.   Prior Rehab/Hospitalizations:  Has the patient had prior rehab or hospitalizations prior to admission? Yes   Has the patient had major surgery during 100 days prior to admission? No   Current Medications    Current Facility-Administered Medications:  .  acetaminophen (TYLENOL) tablet 650 mg, 650 mg, Oral, Q4H PRN, 650 mg at 09/02/20 1519 **OR** acetaminophen  (TYLENOL) 160 MG/5ML solution 650 mg, 650 mg, Per Tube, Q4H PRN **OR** acetaminophen (TYLENOL) suppository 650 mg, 650 mg, Rectal, Q4H PRN, Amie Portland, MD .  albuterol (VENTOLIN HFA) 108 (90 Base) MCG/ACT inhaler 2 puff, 2 puff, Inhalation, Q4H PRN, Kirby-Graham, Karsten Fells, NP .  amLODipine (NORVASC) tablet 5 mg, 5 mg, Oral, Daily, Garvin Fila, MD, 5 mg at 09/05/20 0933 .  butorphanol (STADOL) nasal spray 1 spray, 1 spray, Nasal, Q4H PRN, Amie Portland, MD, 1 spray at 09/05/20 2215 .  Chlorhexidine Gluconate Cloth 2 % PADS 6 each, 6 each, Topical, Daily, Bhagat, Bhavinkumar, PA, 6 each at 09/04/20 0928 .  glipiZIDE (GLUCOTROL) tablet 10 mg, 10 mg, Oral, QAC breakfast, Garvin Fila, MD, 10 mg at 09/06/20 0839 .  insulin aspart (novoLOG) injection 0-9 Units, 0-9 Units, Subcutaneous, TID WC, Kirby-Graham, Karsten Fells, NP, 2 Units at 09/06/20 [phone] .  insulin NPH Human (NOVOLIN N) injection 5 Units, 5 Units, Subcutaneous, BID AC & HS, Donnamae Jude, [facility], 5 Units at 09/06/20 3716 .  levothyroxine (SYNTHROID) tablet 75 mcg, 75 mcg, Oral, Q0600, Gardiner Barefoot, NP, 75 mcg at 09/06/20 0522 .  pantoprazole (PROTONIX) EC tablet 40 mg, 40 mg, Oral, QHS, Donnamae Jude, RPH, 40 mg at 09/05/20 2142 .  PHENobarbital (LUMINAL) tablet 194.4 mg, 194.4 mg, Oral, Once per day on Sun Mon Fri, 194.4 mg at 09/05/20 2141 **AND** PHENobarbital (LUMINAL) tablet 97.2 mg, 97.2 mg, Oral, Once per day on Tue Wed Thu Sat, Kirby-Graham, Karen J, NP, 97.2 mg at 09/03/20 2219 .  senna-docusate (Senokot-S) tablet 1 tablet, 1 tablet, Oral, BID, Amie Portland, MD, 1 tablet at  09/04/20 2210 .  tiZANidine (ZANAFLEX) tablet 4 mg, 4 mg, Oral, Q4H PRN, Garvin Fila, MD, 4 mg at 09/06/20 0839 .  zolpidem (AMBIEN) tablet 5 mg, 5 mg, Oral, QHS PRN, Garvin Fila, MD   Patients Current Diet:     Diet Order                      Diet heart healthy/carb modified Room service appropriate? Yes; Fluid consistency: Thin  Diet  effective now                      Precautions / Restrictions Precautions Precautions: Fall Restrictions Weight Bearing Restrictions: No Other Position/Activity Restrictions: SBP <140    Has the patient had 2 or more falls or a fall with injury in the past year?No   Prior Activity Level  Mod I with AD pta   Prior Functional Level Prior Function Level of Independence: Independent Comments: regained independence following injury last year requiring use of AD   Self Care: Did the patient need help bathing, dressing, using the toilet or eating?  Independent   Indoor Mobility: Did the patient need assistance with walking from room to room (with or without device)? Independent   Stairs: Did the patient need assistance with internal or external stairs (with or without device)? Independent   Functional Cognition: Did the patient need help planning regular tasks such as shopping or remembering to take medications? Independent   Home Assistive Devices / Equipment Home Assistive Devices/Equipment: CBG Meter, Blood pressure cuff, Eyeglasses Home Equipment: Environmental consultant - 2 wheels, Walker - 4 wheels, [place] - single point, Grab bars - tub/shower, Grab bars - toilet   Prior Device Use: Indicate devices/aids used by the patient prior to current illness, exacerbation or injury? Walker   Current Functional Level Cognition   Overall Cognitive Status: Within Functional Limits for tasks assessed Orientation Level: Oriented X4 General Comments: flat affect, but A&Ox3    Extremity Assessment (includes Sensation/Coordination)   Upper Extremity Assessment: LUE deficits/detail LUE Deficits / Details: grossly 3/5, fair grip strength, decreased sensation (most prominant in digits/hand) LUE Sensation: decreased light touch LUE Coordination: decreased fine motor  Lower Extremity Assessment: Defer to PT evaluation     ADLs   Overall ADL's : Needs assistance/impaired Eating/Feeding: Set up,  Sitting Grooming: Wash/dry hands, Set up, Sitting Upper Body Bathing: Min guard, Sitting Lower Body Bathing: Moderate assistance, Sit to/from stand Upper Body Dressing : Min guard, Sitting Lower Body Dressing: Moderate assistance, Sit to/from stand Toilet Transfer: Moderate assistance, Stand-pivot, BSC Toileting- Clothing Manipulation and Hygiene: Moderate assistance, Sit to/from stand Toileting - Clothing Manipulation Details (indicate cue type and reason): assist for balance in standing and gown management while pt completing anterior pericare  Functional mobility during ADLs: Moderate assistance (stand pivot, few steps BSC to recliner )     Mobility   Overal bed mobility: Needs Assistance Bed Mobility: Supine to Sit Supine to sit: Min guard General bed mobility comments: pt up in chair upon PT arrival     Transfers   Overall transfer level: Needs assistance Equipment used: Rolling walker (2 wheeled) Transfers: Sit to/from Stand Sit to Stand: Min assist Stand pivot transfers: Mod assist General transfer comment: minA to power up, verbal cues to push up from chair not pull up on walker, increased time     Ambulation / Gait / Stairs / Wheelchair Mobility   Ambulation/Gait Ambulation/Gait assistance: Min assist, +2 safety/equipment Gait Distance (  Feet): 60 Feet (x2) Assistive device: Rolling walker (2 wheeled) Gait Pattern/deviations: Step-through pattern, Decreased stride length General Gait Details: short step lenght but able to clear both feet, pt reports R LE not being strong from "breaking it last year". pt with increased UE dependence on RW but no episode of LOB Gait velocity: decresaed Gait velocity interpretation: <1.31 ft/sec, indicative of household ambulator     Posture / Balance Balance Overall balance assessment: Needs assistance Sitting-balance support: No upper extremity supported, Feet unsupported Sitting balance-Leahy Scale: Fair Standing balance support:  Bilateral upper extremity supported, During functional activity Standing balance-Leahy Scale: Poor Standing balance comment: reliant on UE support/external assist      Special needs/care consideration Hgb A1c 8.1    Previous Home Environment  Living Arrangements: Spouse/significant other, Children  Lives With: Family (spouse and adult daughter, Wilburn Cornelia) Available Help at Discharge: Family, Available 24 hours/day (spouse works adn out of state, but family can arrnage 24/7 a) Type of Home: [place]: One level Home Access: Stairs to enter Entrance Stairs-Rails: None Entrance Stairs-Number of Steps: 4 (previously had ramp, spouse states he can reinstall ramp if needed) Bathroom Shower/Tub: Multimedia programmer: Standard Bathroom Accessibility: Yes How Accessible: Accessible via walker [place]: No   Discharge Living Setting Plans for Discharge Living Setting: Patient's home, Lives with (comment) (spouse and adult daughter) Type of Home at Discharge: House Discharge Home Layout: One level Discharge Home Access: Stairs to enter Entrance Stairs-Rails: None Entrance Stairs-Number of Steps: 4: spouse can reinstall ramp if needed Discharge Bathroom Shower/Tub: Walk-in shower Discharge Bathroom Toilet: Standard Discharge Bathroom Accessibility: Yes How Accessible: Accessible via walker Does the patient have any problems obtaining your medications?: No   Social/Family/Support Systems Contact Information: spouse , [name] Anticipated Caregiver: spouse and daughter Anticipated Ambulance person Information: see above Ability/Limitations of Caregiver: spouse works out of [employer], operational Availability: 24/7 Discharge Plan Discussed with Primary Caregiver: Yes Is Caregiver In Agreement with Plan?: Yes Does Caregiver/Family have Issues with Lodging/Transportation while Pt is in Rehab?: No   Goals Patient/Family Goal for Rehab: Mod I with PT, OT, and SLP Expected  length of stay: ELOS 7 to 10 days Pt/Family Agrees to Admission and willing to participate: Yes Program Orientation Provided & Reviewed with Pt/Caregiver Including Roles  & Responsibilities: Yes   Decrease burden of Care through IP rehab admission: n/a   Possible need for SNF placement upon discharge:not anticipated   Patient Condition: This patient's condition remains as documented in the consult dated 09/05/2020, in which the Rehabilitation Physician determined and documented that the patient's condition is appropriate for intensive rehabilitative care in an inpatient rehabilitation facility. Will admit to inpatient rehab today.   Preadmission Screen Completed By:  Cleatrice Burke, RN, 09/06/2020 11:20 AM ______________________________________________________________________   Discussed status with Dr. Dagoberto Ligas on 09/06/2020 at  1126 and received approval for admission today.   Admission Coordinator:  Cleatrice Burke, time 6578 Date 09/06/2020             Cosigned by: Courtney Heys, MD at 09/06/2020 11:56 AM  Revision History Note Details  Author Cristina Gong, RN File Time 09/06/2020 11:26 AM  Author Type Rehab Admission Coordinator Status Signed  Last Editor Cristina Gong, RN Service Physical Medicine and [place] # [id number] Admit Date 09/06/2020

## 2020-09-06 NOTE — Progress Notes (Signed)
Inpatient Rehabilitation Admissions Coordinator  I met with patient and spouse at bedside. We discussed goals and expectations of a CIR admit. I have a CIR bed and they are in agreement to admit. I will make the arrangements. Acute team and TOC made aware.  Danne Baxter, RN, MSN Rehab Admissions Coordinator [phone] 09/06/2020 10:25 AM

## 2020-09-06 NOTE — Plan of Care (Signed)
  Problem: Education: Goal: Knowledge of General Education information will improve Description: Including pain rating scale, medication(s)/side effects and non-pharmacologic comfort measures Outcome: Progressing   Problem: Health Behavior/Discharge Planning: Goal: Ability to manage health-related needs will improve Outcome: Progressing   Problem: Clinical Measurements: Goal: Ability to maintain clinical measurements within normal limits will improve Outcome: Progressing Goal: Will remain free from infection Outcome: Progressing Goal: Diagnostic test results will improve Outcome: Progressing Goal: Respiratory complications will improve Outcome: Progressing Goal: Cardiovascular complication will be avoided Outcome: Progressing   Problem: Activity: Goal: Risk for activity intolerance will decrease Outcome: Progressing   Problem: Nutrition: Goal: Adequate nutrition will be maintained Outcome: Progressing   Problem: Coping: Goal: Level of anxiety will decrease Outcome: Progressing   Problem: Elimination: Goal: Will not experience complications related to bowel motility Outcome: Progressing Goal: Will not experience complications related to urinary retention Outcome: Progressing   Problem: Pain Managment: Goal: General experience of comfort will improve Outcome: Progressing   Problem: Safety: Goal: Ability to remain free from injury will improve Outcome: Progressing   Problem: Skin Integrity: Goal: Risk for impaired skin integrity will decrease Outcome: Progressing   Problem: Education: Goal: Knowledge of disease or condition will improve Outcome: Progressing Goal: Knowledge of secondary prevention will improve Outcome: Progressing Goal: Knowledge of patient specific risk factors addressed and post discharge goals established will improve Outcome: Progressing Goal: Individualized Educational Video(s) Outcome: Progressing   Problem: Coping: Goal: Will verbalize  positive feelings about self Outcome: Progressing Goal: Will identify appropriate support needs Outcome: Progressing   Problem: Health Behavior/Discharge Planning: Goal: Ability to manage health-related needs will improve Outcome: Progressing   Problem: Self-Care: Goal: Ability to participate in self-care as condition permits will improve Outcome: Progressing Goal: Verbalization of feelings and concerns over difficulty with self-care will improve Outcome: Progressing Goal: Ability to communicate needs accurately will improve Outcome: Progressing   Problem: Nutrition: Goal: Risk of aspiration will decrease Outcome: Progressing   Problem: Intracerebral Hemorrhage Tissue Perfusion: Goal: Complications of Intracerebral Hemorrhage will be minimized Outcome: Progressing   Problem: Education: Goal: Knowledge of disease or condition will improve Outcome: Progressing Goal: Knowledge of secondary prevention will improve Outcome: Progressing Goal: Knowledge of patient specific risk factors addressed and post discharge goals established will improve Outcome: Progressing Goal: Individualized Educational Video(s) Outcome: Progressing   Problem: Coping: Goal: Will verbalize positive feelings about self Outcome: Progressing Goal: Will identify appropriate support needs Outcome: Progressing   Problem: Health Behavior/Discharge Planning: Goal: Ability to manage health-related needs will improve Outcome: Progressing   Problem: Self-Care: Goal: Ability to participate in self-care as condition permits will improve Outcome: Progressing Goal: Verbalization of feelings and concerns over difficulty with self-care will improve Outcome: Progressing Goal: Ability to communicate needs accurately will improve Outcome: Progressing

## 2020-09-06 NOTE — H&P (Signed)
Physical Medicine and Rehabilitation Admission H&P    Chief Complaint  Patient presents with  . Functional deficits    HPI: Natalie [name] is a 69 year old RH- emale with history of T2DM with neuropathy, CKD III, chronic pain due to cervicalgia/migraines, recurrent UTIs, seizure d/o- on phenobarbital who was admitted on 09/02/20 with acute onset of left sided weakness with left facial droop and mild slurred speech.  CT head done showing 2 cc acute hematoma right corona radiata with early extension into right lateral ventricle.  MRI brain showed IPH without underlying lesion, few chronic microhemorrhages as well as mild to moderate chronic ischemic changes.  CTA head/neck was negative for LVO, aneurysm or occlusion.  Bleed felt to be hypertensive in nature with SBP goals <140.   Follow-up CT head showed stable bleed.  Therapy evaluations completed and patient was noted to have balance deficits affecting  mobility and ADLs.  CIR was recommended due to functional decline.   Pt reports hurting "normal" amount-  Broke R foot in 08/2019- still hurts a lot.  Has migraine since age 41- only Stadol works Asking to get IVs out-  LBM today  Review of Systems  Constitutional: Negative for chills and fever.  HENT: Negative for hearing loss and tinnitus.   Eyes: Negative for blurred vision and double vision.  Respiratory: Negative for cough and wheezing.   Cardiovascular: Negative for chest pain and palpitations.  Gastrointestinal: Negative for diarrhea, heartburn and nausea.  Genitourinary: Negative for dysuria and urgency.  Musculoskeletal: Positive for joint pain (left shoulder pain better. Left wrist  tender).  Skin: Negative for itching and rash.  Neurological: Positive for sensory change (feet are numb) and weakness (RLE weakness since foot fracture a couple of years ago. ). Negative for dizziness and headaches.  Psychiatric/Behavioral: The patient has insomnia. The patient is not  nervous/anxious.   All other systems reviewed and are negative.    Past Medical History:  Diagnosis Date  . Arthritis   . Atrophy of left kidney   . Cervicalgia   . Chronic kidney disease, stage 3 ([place])    acute aki 03-14-2018 in setting pyelonehritis-- resolved  . Chronic migraine   . Chronic pain syndrome   . History of kidney stones   . History of pyelonephritis    hx recurrent  . History of recurrent UTIs   . History of sepsis    07/ 2017 secondary to pyelonephritis  . Hypertension   . Hypothyroidism   . Iron deficiency anemia   . Left ureteral stone   . Mixed hyperlipidemia   . Nephrolithiasis    right side nonobstructive per CT 03-14-2018  . Right shoulder pain    post sx 02-18-2018  . Seizure disorder ([place]) followed by pcp   per pt dx age 70--  no seizure since 1980's , controlled w/ phenobarbitol  . Type 2 diabetes mellitus ([place])    followed by pcp  . Wears glasses     Past Surgical History:  Procedure Laterality Date  . CESAREAN SECTION  1974  . CHOLECYSTECTOMY OPEN  2002  . CYSTO/  LEFT RETROGRADE PYELOGRAM  06-11-2006   dr Jeffie Pollock  [facility]  . CYSTOSCOPY WITH RETROGRADE PYELOGRAM, URETEROSCOPY AND STENT PLACEMENT Left 04/10/2018   Procedure: CYSTOSCOPY WITH RETROGRADE PYELOGRAM, URETEROSCOPY AND STENT PLACEMENT;  Surgeon: Franchot Gallo, MD;  Location: [facility];  Service: Urology;  Laterality: Left;  . FOOT ARTHRODESIS Right 09/10/2019   Procedure: ARTHRODESIS FOOT;  Surgeon: Erle Crocker, MD;  Location: [place];  Service: Orthopedics;  Laterality: Right;  . IR URETERAL STENT PLACEMENT EXISTING ACCESS RIGHT  08-21-2007    dr Karsten Ro  [facility]  . LEFT RETROGRADE PYELOGRAM/ LEFT URETERAL STENT PLACEMENT  02-25-2001   dr Jeffie Pollock [facility]  . NEPHROLITHOTOMY Right 05/14/2016   Procedure: NEPHROLITHOTOMY PERCUTANEOUS;  Surgeon: Franchot Gallo, MD;  Location: [facility];  Service: Urology;  Laterality: Right;  . OPEN REDUCTION INTERNAL  FIXATION (ORIF) FOOT LISFRANC FRACTURE Right 09/10/2019   Procedure: OPEN REDUCTION INTERNAL FIXATION (ORIF) FOOT LISFRANC FRACTURE;  Surgeon: Erle Crocker, MD;  Location: [place];  Service: Orthopedics;  Laterality: Right;  . REPAIR EXTENSOR TENDON WITH METATARSAL OSTEOTOMY AND OPEN REDUCTION IN Right 09/10/2019   Procedure: OPEN TREATMENT RIGHT FIRST, SECOND, THIRD TARSOMETATARSAL LISFRANC, MIDFOOT FUSION;  Surgeon: Erle Crocker, MD;  Location: [place];  Service: Orthopedics;  Laterality: Right;  . SHOULDER ARTHROSCOPY Bilateral left 11-13-2000;  right 02-18-2018    Family History  Problem Relation Age of Onset  . Heart disease Father   . Diabetes Father     Social History:  Married. Husband works but daughter can assist after discharge. She reports that she has never smoked. She has never used smokeless tobacco. She reports that she does not drink alcohol and does not use drugs.    Allergies  Allergen Reactions  . Compazine Other (See Comments)    Bad headache, vomiting; IV   . Cymbalta [Duloxetine Hcl] Other (See Comments)    "Kept me awake"  . Methadone Hives  . Escitalopram Anxiety  . Macrobid [Nitrofurantoin Monohyd Macro] Rash    Medications Prior to Admission  Medication Sig Dispense Refill  . albuterol (VENTOLIN HFA) 108 (90 Base) MCG/ACT inhaler Inhale 2 puffs into the lungs every 4 (four) hours as needed for wheezing or shortness of breath. 18 g 0  . amLODipine (NORVASC) 5 MG tablet Take 1 tablet (5 mg total) by mouth daily. (Patient taking differently: Take 5 mg by mouth daily with supper. ) 90 tablet 3  . atorvastatin (LIPITOR) 20 MG tablet Take 1 tablet (20 mg total) by mouth daily at 6 PM.    . butorphanol (STADOL) 10 MG/ML nasal spray PLACE 1 SPRAY INTO THE NOSE EVERY 4 HOURS AS NEEDED FOR MIGRAINE. (Patient taking differently: Place 1 spray into the nose every 4 (four) hours as needed for headache or migraine. PLACE  1 SPRAY INTO THE NOSE EVERY 4 HOURS AS NEEDED FOR MIGRAINE.) 2.5 mL 5  . glipiZIDE (GLUCOTROL) 10 MG tablet TAKE 2 TABLETS (20 MG TOTAL) BY MOUTH 2 (TWO) TIMES DAILY BEFORE A MEAL. (Patient taking differently: Take 10 mg by mouth daily before breakfast. ) 360 tablet 0  . insulin aspart (NOVOLOG) 100 UNIT/ML injection Inject 0-9 Units into the skin 3 (three) times daily with meals. 10 mL 11  . insulin NPH Human (NOVOLIN N) 100 UNIT/ML injection Inject 0.05 mLs (5 Units total) into the skin 2 (two) times daily at 8 am and 10 pm. 10 mL 11  . levothyroxine (SYNTHROID, LEVOTHROID) 75 MCG tablet Take 1 tablet (75 mcg total) by mouth daily. 90 tablet 1  . PHENobarbital (LUMINAL) 97.2 MG tablet TAKE 2 TABLETS BY MOUTH ONCE DAILY ON MON,WED,AND FRI AND 1 TABLET DAILY ON OTHER DAYS (Patient taking differently: Take 97.2-194.4 mg by mouth See admin instructions. TAKE 2 TABLETS (194.4 MG TOTALLY) BY MOUTH ON MON, FRI, SUN; TAKE 1 TABLET (  97.2 MG TOTALLY) DAILY ON OTHER DAYS) 135 tablet 1  . senna-docusate (SENOKOT-S) 8.6-50 MG tablet Take 1 tablet by mouth 2 (two) times daily.    Marland Kitchen tiZANidine (ZANAFLEX) 4 MG tablet TAKE 1 TABLET BY MOUTH EVERY 4-6 HOURS AS NEEDED FOR MUSCLE SPASM (Patient taking differently: Take 4 mg by mouth every 4 (four) hours as needed for muscle spasms. ) 180 tablet 1  . zolpidem (AMBIEN) 10 MG tablet take 1 tablet by mouth at bedtime for sleep if needed. (Patient taking differently: Take 10 mg by mouth at bedtime as needed for sleep. take 1 tablet by mouth at bedtime for sleep if needed.) 90 tablet 1    Drug Regimen Review  Drug regimen was reviewed and remains appropriate with no significant issues identified  Home: Home Living Family/patient expects to be discharged to:: Private residence Living Arrangements: Spouse/significant other, Children   Functional History:    Functional Status:  Mobility:          ADL:    Cognition: Cognition Orientation Level: Oriented X4      Blood pressure (!) 149/72, pulse 80, temperature 98.9 F (37.2 C), temperature source Oral, resp. rate 16, height 4' 7.98" (1.422 m), weight 78 kg. Physical Exam Vitals and nursing note reviewed.  Constitutional:      Appearance: Normal appearance.     Comments: Pt awake, alert, appropriate, sitting in bedside chair, NAD- BMI 38  HENT:     Head: Normocephalic and atraumatic.     Comments: Tongue midline and smile equal on exam today- did not appear to have L facial droop    Right Ear: External ear normal.     Left Ear: External ear normal.     Nose: Nose normal. No congestion.     Mouth/Throat:     Mouth: Mucous membranes are dry.     Pharynx: Oropharynx is clear. No oropharyngeal exudate.  Eyes:     General:        Right eye: No discharge.        Left eye: No discharge.     Extraocular Movements: Extraocular movements intact.     Comments: No nystagmus B/L  Cardiovascular:     Comments: RRR- no JVD Pulmonary:     Comments: CTA B/L- no W/R/R- good air movement Abdominal:     Comments: Soft, NT, ND, (+)BS   Musculoskeletal:     Cervical back: Normal range of motion. No rigidity.     Comments: Well healed old incision on right foot  RUE/RLE 5/5 in biceps, triceps, WE< grip and finger abd as well as HF, KE, KF, DF and PF LUE and LLE 4+/5 in same muscles  Skin:    General: Skin is warm and dry.     Comments: 3 IVs- R AC fossa, L hand and L AC fossa- look OK- but she's been scratching the area- skin excoriated   Neurological:     Mental Status: She is alert and oriented to person, place, and time.     Comments: Speech clear. Able to follow commands without difficulty.   Ox3 Decreased sensation in LUE and LLE per pt.   Psychiatric:        Mood and Affect: Mood normal.        Behavior: Behavior normal.     Results for orders placed or performed during the hospital encounter of 09/06/20 (from the past 48 hour(s))  Glucose, capillary     Status: Abnormal   Collection  Time: 09/06/20  4:22 PM  Result Value Ref Range   Glucose-Capillary 149 (H) 70 - 99 mg/dL    Comment: Glucose reference range applies only to samples taken after fasting for at least 8 hours.   Comment 1 Notify RN    DG Wrist Complete Left  Result Date: 09/06/2020 CLINICAL DATA:  Pain status post fall EXAM: LEFT WRIST - COMPLETE 3+ VIEW COMPARISON:  None. FINDINGS: There are degenerative changes of the wrist and first [place]. There is a probable old healed fracture of the distal radius. There is an old fracture of the ulnar styloid process. There is no acute displaced fracture or dislocation on today's study. IMPRESSION: Negative. Electronically Signed   By: Constance Holster M.D.   On: 09/06/2020 19:38       Medical Problem List and Plan: 1.  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  Antithrombotics: -DVT/anticoagulation:  Mechanical: Sequential compression devices, below knee Bilateral lower extremities  -antiplatelet therapy: N/a 3. Chronic pain due to migraines/Pain Management: Stadol or Tylenol prn.  4. Mood: LCSW to follow for evaluation and support.   -antipsychotic agents: N/A 5. Neuropsych: This patient is capable of making decisions on her own behalf. 6. Skin/Wound Care: Routine pressure relief measures.  7. Fluids/Electrolytes/Nutrition: Monitor I/O. Check lytes in am.  8. HTN: Monitor BP tid--continue Norvasc daily.  9. T2DM with peripheral neuropathy: Hgb A1C-8.1. Will monitor BS ac/hs. Continue glucotrol and NPH insulin.  10. Seizure disorder: Managed on home dose Phenobarbital. No seizure for 30+ years on meds 11.  CKD: Baseline SCr- has been around 1.6 per records. Now down to 1.3---> will monitor with serial checks. Avoid nephrotoxic medications.  12. ABLA: Recheck CBC in am.  13. Left shoulder/wrist pain: Getting better --reports fall at admission. Shoulder X  ray negative--will order wrist films.  Will add Voltaren gel.   Bary Leriche, PA-C 09/06/2020   I have personally performed a face to face diagnostic evaluation of this patient and formulated the key components of the plan.  Additionally, I have personally reviewed laboratory data, imaging studies, as well as relevant notes and concur with the physician assistant's documentation above.      Courtney Heys, MD 09/06/2020

## 2020-09-06 NOTE — TOC Transition Note (Signed)
Transition of Care [facility]) - CM/SW Discharge Note   Patient Details  Name: Natalie [name] MRN: [id number] Date of Birth: Aug 07, 1951  [facility] CM/SW Contact:  Pollie Friar, RN Phone Number: 09/06/2020, 12:13 PM   Clinical Narrative:    Pt is discharging to CIR today. CM signing off.   Final next level of care: IP Rehab Facility Barriers to Discharge: No Barriers Identified   Patient Goals and CMS Choice     Choice offered to / list presented to : Patient  Discharge Placement                       Discharge Plan and Services                                     Social Determinants of Health (SDOH) Interventions     Readmission Risk Interventions No flowsheet data found.

## 2020-09-06 NOTE — PMR Pre-admission (Signed)
PMR Admission Coordinator Pre-Admission Assessment  Patient: Natalie [name] is an 69 y.o., female MRN: [id number] DOB: 02/13/1951 Height: 4\' 8"  (142.2 cm) Weight: 77.6 kg              Insurance Information HMO:     PPO:      PCP:      IPA:      80/20:      OTHER:  PRIMARY: Medicare a and b      Policy#: [id number]      Subscriber: pt Benefits:  Phone #: passport one online     Name: 12/6 Eff. Date: 11/29/2013     Deduct: $1484      Out of Pocket Max: none      Life Max: none  CIR: 100%      SNF: 20 full days Outpatient: 80%     Co-Pay: 205 Home Health: 100%      Co-Pay: none DME: 80%     Co-Pay: 20% Providers: pt choice  SECONDARY: [employer]      Policy#: [id number] Financial Counselor:       Phone#:   The "Data Collection Information Summary" for patients in Inpatient Rehabilitation Facilities with attached "Privacy Act [place] Records" was provided and verbally reviewed with: Patient and Family  Emergency Contact Information Contact Information    Name Relation Home Work Mobile   Craigie,Shelby Daughter [phone]     Salma, Walrond   [phone]     Current Medical History  Patient Admitting Diagnosis: ICH  History of Present Illness:o. 69 year old female with history of seizure disorder, migraines, CKD, T2DM with peripheral neuropathy, chronic pain, chronic UTIs who was admitted on 09/02/20 with acute onset of left sided weakness with left facial droop and mild slurred speech. CT head done showing 2 cc acute hematoma in right corona radiata with early extension into right lateral ventricle.  Follow up CT head showed stable bleed. MRI brain showed IPH without underlying lesions and few chronic micro hemorrhages as well as mild to moderate chronic ischemic changes. CTA head/neck was negative for LVO, aneurysm or occlusion. Bleed felt to be hypertensive in nature with SBP goal<140. 2D echo showed EF 60-65% .  Complete NIHSS TOTAL: 2 Glasgow Coma Scale Score:  15  Past Medical History  Past Medical History:  Diagnosis Date  . Arthritis   . Atrophy of left kidney   . Cervicalgia   . Chronic kidney disease, stage 3 ([place])    acute aki 03-14-2018 in setting pyelonehritis-- resolved  . Chronic migraine   . Chronic pain syndrome   . History of kidney stones   . History of pyelonephritis    hx recurrent  . History of recurrent UTIs   . History of sepsis    07/ 2017 secondary to pyelonephritis  . Hypertension   . Hypothyroidism   . Iron deficiency anemia   . Left ureteral stone   . Mixed hyperlipidemia   . Nephrolithiasis    right side nonobstructive per CT 03-14-2018  . Right shoulder pain    post sx 02-18-2018  . Seizure disorder ([place]) followed by pcp   per pt dx age 39--  no seizure since 1980's , controlled w/ phenobarbitol  . Type 2 diabetes mellitus ([place])    followed by pcp  . Wears glasses     Family History  family history includes Diabetes in her father; Heart disease in her father.  Prior Rehab/Hospitalizations:  Has the patient had prior  rehab or hospitalizations prior to admission? Yes  Has the patient had major surgery during 100 days prior to admission? No  Current Medications   Current Facility-Administered Medications:  .  acetaminophen (TYLENOL) tablet 650 mg, 650 mg, Oral, Q4H PRN, 650 mg at 09/02/20 1519 **OR** acetaminophen (TYLENOL) 160 MG/5ML solution 650 mg, 650 mg, Per Tube, Q4H PRN **OR** acetaminophen (TYLENOL) suppository 650 mg, 650 mg, Rectal, Q4H PRN, Amie Portland, MD .  albuterol (VENTOLIN HFA) 108 (90 Base) MCG/ACT inhaler 2 puff, 2 puff, Inhalation, Q4H PRN, Kirby-Graham, Karsten Fells, NP .  amLODipine (NORVASC) tablet 5 mg, 5 mg, Oral, Daily, Garvin Fila, MD, 5 mg at 09/05/20 0933 .  butorphanol (STADOL) nasal spray 1 spray, 1 spray, Nasal, Q4H PRN, Amie Portland, MD, 1 spray at 09/05/20 2215 .  Chlorhexidine Gluconate Cloth 2 % PADS 6 each, 6 each, Topical, Daily, Bhagat, Bhavinkumar, PA, 6 each  at 09/04/20 0928 .  glipiZIDE (GLUCOTROL) tablet 10 mg, 10 mg, Oral, QAC breakfast, Garvin Fila, MD, 10 mg at 09/06/20 0839 .  insulin aspart (novoLOG) injection 0-9 Units, 0-9 Units, Subcutaneous, TID WC, Kirby-Graham, Karsten Fells, NP, 2 Units at 09/06/20 [phone] .  insulin NPH Human (NOVOLIN N) injection 5 Units, 5 Units, Subcutaneous, BID AC & HS, Donnamae Jude, [facility], 5 Units at 09/06/20 9604 .  levothyroxine (SYNTHROID) tablet 75 mcg, 75 mcg, Oral, Q0600, Gardiner Barefoot, NP, 75 mcg at 09/06/20 0522 .  pantoprazole (PROTONIX) EC tablet 40 mg, 40 mg, Oral, QHS, Donnamae Jude, RPH, 40 mg at 09/05/20 2142 .  PHENobarbital (LUMINAL) tablet 194.4 mg, 194.4 mg, Oral, Once per day on Sun Mon Fri, 194.4 mg at 09/05/20 2141 **AND** PHENobarbital (LUMINAL) tablet 97.2 mg, 97.2 mg, Oral, Once per day on Tue Wed Thu Sat, Kirby-Graham, Karen J, NP, 97.2 mg at 09/03/20 2219 .  senna-docusate (Senokot-S) tablet 1 tablet, 1 tablet, Oral, BID, Amie Portland, MD, 1 tablet at 09/04/20 2210 .  tiZANidine (ZANAFLEX) tablet 4 mg, 4 mg, Oral, Q4H PRN, Garvin Fila, MD, 4 mg at 09/06/20 0839 .  zolpidem (AMBIEN) tablet 5 mg, 5 mg, Oral, QHS PRN, Garvin Fila, MD  Patients Current Diet:  Diet Order            Diet heart healthy/carb modified Room service appropriate? Yes; Fluid consistency: Thin  Diet effective now                 Precautions / Restrictions Precautions Precautions: Fall Restrictions Weight Bearing Restrictions: No Other Position/Activity Restrictions: SBP <140   Has the patient had 2 or more falls or a fall with injury in the past year?No  Prior Activity Level  Mod I with AD pta  Prior Functional Level Prior Function Level of Independence: Independent Comments: regained independence following injury last year requiring use of AD  Self Care: Did the patient need help bathing, dressing, using the toilet or eating?  Independent  Indoor Mobility: Did the patient need  assistance with walking from room to room (with or without device)? Independent  Stairs: Did the patient need assistance with internal or external stairs (with or without device)? Independent  Functional Cognition: Did the patient need help planning regular tasks such as shopping or remembering to take medications? Independent  Home Assistive Devices / Equipment Home Assistive Devices/Equipment: CBG Meter, Blood pressure cuff, Eyeglasses Home Equipment: Environmental consultant - 2 wheels, Walker - 4 wheels, [place] - single point, Grab bars - tub/shower, Grab bars - toilet  Prior Device Use: Indicate devices/aids used by the patient prior to current illness, exacerbation or injury? Walker  Current Functional Level Cognition  Overall Cognitive Status: Within Functional Limits for tasks assessed Orientation Level: Oriented X4 General Comments: flat affect, but A&Ox3    Extremity Assessment (includes Sensation/Coordination)  Upper Extremity Assessment: LUE deficits/detail LUE Deficits / Details: grossly 3/5, fair grip strength, decreased sensation (most prominant in digits/hand) LUE Sensation: decreased light touch LUE Coordination: decreased fine motor  Lower Extremity Assessment: Defer to PT evaluation    ADLs  Overall ADL's : Needs assistance/impaired Eating/Feeding: Set up, Sitting Grooming: Wash/dry hands, Set up, Sitting Upper Body Bathing: Min guard, Sitting Lower Body Bathing: Moderate assistance, Sit to/from stand Upper Body Dressing : Min guard, Sitting Lower Body Dressing: Moderate assistance, Sit to/from stand Toilet Transfer: Moderate assistance, Stand-pivot, BSC Toileting- Clothing Manipulation and Hygiene: Moderate assistance, Sit to/from stand Toileting - Clothing Manipulation Details (indicate cue type and reason): assist for balance in standing and gown management while pt completing anterior pericare  Functional mobility during ADLs: Moderate assistance (stand pivot, few steps BSC to  recliner )    Mobility  Overal bed mobility: Needs Assistance Bed Mobility: Supine to Sit Supine to sit: Min guard General bed mobility comments: pt up in chair upon PT arrival    Transfers  Overall transfer level: Needs assistance Equipment used: Rolling walker (2 wheeled) Transfers: Sit to/from Stand Sit to Stand: Min assist Stand pivot transfers: Mod assist General transfer comment: minA to power up, verbal cues to push up from chair not pull up on walker, increased time    Ambulation / Gait / Stairs / Wheelchair Mobility  Ambulation/Gait Ambulation/Gait assistance: Min assist, +2 safety/equipment Gait Distance (Feet): 60 Feet (x2) Assistive device: Rolling walker (2 wheeled) Gait Pattern/deviations: Step-through pattern, Decreased stride length General Gait Details: short step lenght but able to clear both feet, pt reports R LE not being strong from "breaking it last year". pt with increased UE dependence on RW but no episode of LOB Gait velocity: decresaed Gait velocity interpretation: <1.31 ft/sec, indicative of household ambulator    Posture / Balance Balance Overall balance assessment: Needs assistance Sitting-balance support: No upper extremity supported, Feet unsupported Sitting balance-Leahy Scale: Fair Standing balance support: Bilateral upper extremity supported, During functional activity Standing balance-Leahy Scale: Poor Standing balance comment: reliant on UE support/external assist     Special needs/care consideration Hgb A1c 8.1   Previous Home Environment  Living Arrangements: Spouse/significant other, Children  Lives With: Family (spouse and adult daughter, Wilburn Cornelia) Available Help at Discharge: Family, Available 24 hours/day (spouse works adn out of state, but family can arrnage 24/7 a) Type of Home: [place]: One level Home Access: Stairs to enter Entrance Stairs-Rails: None Entrance Stairs-Number of Steps: 4 (previously had ramp, spouse  states he can reinstall ramp if needed) Bathroom Shower/Tub: Multimedia programmer: Standard Bathroom Accessibility: Yes How Accessible: Accessible via walker [place]: No  Discharge Living Setting Plans for Discharge Living Setting: Patient's home, Lives with (comment) (spouse and adult daughter) Type of Home at Discharge: House Discharge Home Layout: One level Discharge Home Access: Stairs to enter Entrance Stairs-Rails: None Entrance Stairs-Number of Steps: 4: spouse can reinstall ramp if needed Discharge Bathroom Shower/Tub: Walk-in shower Discharge Bathroom Toilet: Standard Discharge Bathroom Accessibility: Yes How Accessible: Accessible via walker Does the patient have any problems obtaining your medications?: No  Social/Family/Support Systems Contact Information: spouse , [name] Anticipated Caregiver: spouse and daughter Anticipated Caregiver's  Contact Information: see above Ability/Limitations of Caregiver: spouse works out of Investment [employer], operational Availability: 24/7 Discharge Plan Discussed with Primary Caregiver: Yes Is Caregiver In Agreement with Plan?: Yes Does Caregiver/Family have Issues with Lodging/Transportation while Pt is in Rehab?: No  Goals Patient/Family Goal for Rehab: Mod I with PT, OT, and SLP Expected length of stay: ELOS 7 to 10 days Pt/Family Agrees to Admission and willing to participate: Yes Program Orientation Provided & Reviewed with Pt/Caregiver Including Roles  & Responsibilities: Yes  Decrease burden of Care through IP rehab admission: n/a  Possible need for SNF placement upon discharge:not anticipated  Patient Condition: This patient's condition remains as documented in the consult dated 09/05/2020, in which the Rehabilitation Physician determined and documented that the patient's condition is appropriate for intensive rehabilitative care in an inpatient rehabilitation facility. Will admit to inpatient rehab today.  Preadmission  Screen Completed By:  Cleatrice Burke, RN, 09/06/2020 11:20 AM ______________________________________________________________________   Discussed status with Dr. Dagoberto Ligas on 09/06/2020 at  1126 and received approval for admission today.  Admission Coordinator:  Cleatrice Burke, time 7628 Date 09/06/2020

## 2020-09-06 NOTE — Progress Notes (Signed)
Physical Therapy Treatment Patient Details Name: Natalie [name] MRN: [id number] DOB: 03-Mar-1951 Today's Date: 09/06/2020    History of Present Illness The pt is a 69 yo female presenting with L-sided facial droop and weakness. 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 PMH includes: chronic headaches, HTN, HLD, DM II, CKD III, recurrent UTIs, and seizures.     PT Comments    Pt with noted impaired sequencing and problem solving with washing of hands at sink s/p tolieting today and noted increased R LE antalgia during ambulation. Pt to benefit from a youth RW. Pt requiring max verbal cues for safety during transfers and for walker management during ambulation. Continue to recommend CIR upon d/c for maximal functional recovery. Acute PT to cont to follow.    Follow Up Recommendations  CIR     Equipment Recommendations  Rolling walker with 5" wheels;3in1 (PT)    Recommendations for Other Services       Precautions / Restrictions Precautions Precautions: Fall Restrictions Weight Bearing Restrictions: No    Mobility  Bed Mobility Overal bed mobility: Needs Assistance Bed Mobility: Supine to Sit     Supine to sit: Min guard     General bed mobility comments: HOB elevated, increased time, pt didn't require physical assist but directional verbal cues to complete task  Transfers Overall transfer level: Needs assistance Equipment used: Rolling walker (2 wheeled) Transfers: Sit to/from Stand Sit to Stand: Min assist         General transfer comment: max directional verbal cues to push up from what patient was sitting on ie. toliet, chair, EOB. minA to power up and steady during transition of hands  Ambulation/Gait Ambulation/Gait assistance: Min assist Gait Distance (Feet): 120 Feet Assistive device: Rolling walker (2 wheeled) Gait Pattern/deviations: Step-through pattern;Decreased stride length;Antalgic;Wide base of support Gait  velocity: decresaed Gait velocity interpretation: <1.31 ft/sec, indicative of household ambulator General Gait Details: pt with increased instability this date with worsening R LE antalgia. continues verbal cues to stay in walker and not push to far out in front, pt to benefit from youth walker   Stairs             Wheelchair Mobility    Modified Rankin (Stroke Patients Only) Modified Rankin (Stroke Patients Only) Pre-Morbid Rankin Score: No significant disability Modified Rankin: Moderately severe disability     Balance Overall balance assessment: Needs assistance Sitting-balance support: No upper extremity supported;Feet unsupported Sitting balance-Leahy Scale: Fair     Standing balance support: Bilateral upper extremity supported;During functional activity Standing balance-Leahy Scale: Poor Standing balance comment: reliant on UE support/external assist                             Cognition Arousal/Alertness: Awake/alert Behavior During Therapy: WFL for tasks assessed/performed Overall Cognitive Status: Impaired/Different from baseline Area of Impairment: Problem solving                             Problem Solving: Difficulty sequencing;Requires verbal cues;Requires tactile cues General Comments: pt requiring max directional verbal cues to wash hands at sink, find soap and paper towels, pt with increased delay in processing today      Exercises      General Comments General comments (skin integrity, edema, etc.): VSS on RA, pt assisted to bathroom, supervision for hygiene, minA for transfer on/off commode  Pertinent Vitals/Pain Pain Assessment: No/denies pain    Home Living     Available Help at Discharge: Family;Available 24 hours/day (spouse works adn out of state, but family can arrnage 24/7 a)                Prior Function            PT Goals (current goals can now be found in the care plan section) Progress  towards PT goals: Progressing toward goals    Frequency    Min 4X/week      PT Plan Current plan remains appropriate    Co-evaluation              AM-PAC PT "6 Clicks" Mobility   Outcome Measure  Help needed turning from your back to your side while in a flat bed without using bedrails?: A Little Help needed moving from lying on your back to sitting on the side of a flat bed without using bedrails?: A Little Help needed moving to and from a bed to a chair (including a wheelchair)?: A Little Help needed standing up from a chair using your arms (e.g., wheelchair or bedside chair)?: A Little Help needed to walk in hospital room?: A Little Help needed climbing 3-5 steps with a railing? : A Lot 6 Click Score: 17    End of Session Equipment Utilized During Treatment: Gait belt Activity Tolerance: Patient tolerated treatment well Patient left: in chair;with call bell/phone within reach;with chair alarm set;with family/visitor present Nurse Communication: Mobility status PT Visit Diagnosis: Other abnormalities of gait and mobility (R26.89)     Time: 1050-1108 PT Time Calculation (min) (ACUTE ONLY): 18 min  Charges:  $Gait Training: 8-22 mins                     Kittie Plater, PT, DPT Acute Rehabilitation Services Pager #: [phone] Office #: [phone]    Berline Lopes 09/06/2020, 1:21 PM

## 2020-09-06 NOTE — H&P (Shared)
Physical Medicine and Rehabilitation Admission H&P    Chief Complaint  Patient presents with  . Functional deficits    HPI: Natalie [name] is a 69 year old RH- emale with history of T2DM with neuropathy, CKD III, chronic pain due to cervicalgia/migraines, recurrent UTIs, seizure d/o- on phenobarbital who was admitted on 09/02/20 with acute onset of left sided weakness with left facial droop and mild slurred speech.  CT head done showing 2 cc acute hematoma right corona radiata with early extension into right lateral ventricle.  MRI brain showed IPH without underlying lesion, few chronic microhemorrhages as well as mild to moderate chronic ischemic changes.  CTA head/neck was negative for LVO, aneurysm or occlusion.  Bleed felt to be hypertensive in nature with SBP goals <140.   Follow-up CT head showed stable bleed.  Therapy evaluations completed and patient was noted to have balance deficits affecting  mobility and ADLs.  CIR was recommended due to functional decline.   Review of Systems  Constitutional: Negative for chills and fever.  HENT: Negative for hearing loss and tinnitus.   Eyes: Negative for blurred vision and double vision.  Respiratory: Negative for cough and wheezing.   Cardiovascular: Negative for chest pain and palpitations.  Gastrointestinal: Negative for diarrhea, heartburn and nausea.  Genitourinary: Negative for dysuria and urgency.  Musculoskeletal: Positive for joint pain (left shoulder pain better. Left wrist  tender).  Skin: Negative for itching and rash.  Neurological: Positive for sensory change (feet are numb) and weakness (RLE weakness since foot fracture a couple of years ago. ). Negative for dizziness and headaches.  Psychiatric/Behavioral: The patient has insomnia. The patient is not nervous/anxious.      Past Medical History:  Diagnosis Date  . Arthritis   . Atrophy of left kidney   . Cervicalgia   . Chronic kidney disease, stage 3 ([place])    acute  aki 03-14-2018 in setting pyelonehritis-- resolved  . Chronic migraine   . Chronic pain syndrome   . History of kidney stones   . History of pyelonephritis    hx recurrent  . History of recurrent UTIs   . History of sepsis    07/ 2017 secondary to pyelonephritis  . Hypertension   . Hypothyroidism   . Iron deficiency anemia   . Left ureteral stone   . Mixed hyperlipidemia   . Nephrolithiasis    right side nonobstructive per CT 03-14-2018  . Right shoulder pain    post sx 02-18-2018  . Seizure disorder ([place]) followed by pcp   per pt dx age 12--  no seizure since 1980's , controlled w/ phenobarbitol  . Type 2 diabetes mellitus ([place])    followed by pcp  . Wears glasses     Past Surgical History:  Procedure Laterality Date  . CESAREAN SECTION  1974  . CHOLECYSTECTOMY OPEN  2002  . CYSTO/  LEFT RETROGRADE PYELOGRAM  06-11-2006   dr Jeffie Pollock  [facility]  . CYSTOSCOPY WITH RETROGRADE PYELOGRAM, URETEROSCOPY AND STENT PLACEMENT Left 04/10/2018   Procedure: CYSTOSCOPY WITH RETROGRADE PYELOGRAM, URETEROSCOPY AND STENT PLACEMENT;  Surgeon: Franchot Gallo, MD;  Location: [facility];  Service: Urology;  Laterality: Left;  . FOOT ARTHRODESIS Right 09/10/2019   Procedure: ARTHRODESIS FOOT;  Surgeon: Erle Crocker, MD;  Location: [place];  Service: Orthopedics;  Laterality: Right;  . IR URETERAL STENT PLACEMENT EXISTING ACCESS RIGHT  08-21-2007    dr Karsten Ro  [facility]  . LEFT RETROGRADE PYELOGRAM/  LEFT URETERAL STENT PLACEMENT  02-25-2001   dr Jeffie Pollock [facility]  . NEPHROLITHOTOMY Right 05/14/2016   Procedure: NEPHROLITHOTOMY PERCUTANEOUS;  Surgeon: Franchot Gallo, MD;  Location[facility];  Service: Urology;  Laterality: Right;  . OPEN REDUCTION INTERNAL FIXATION (ORIF) FOOT LISFRANC FRACTURE Right 09/10/2019   Procedure: OPEN REDUCTION INTERNAL FIXATION (ORIF) FOOT LISFRANC FRACTURE;  Surgeon: Erle Crocker, MD;  Location: [place];  Service:  Orthopedics;  Laterality: Right;  . REPAIR EXTENSOR TENDON WITH METATARSAL OSTEOTOMY AND OPEN REDUCTION IN Right 09/10/2019   Procedure: OPEN TREATMENT RIGHT FIRST, SECOND, THIRD TARSOMETATARSAL LISFRANC, MIDFOOT FUSION;  Surgeon: Erle Crocker, MD;  Location: [place];  Service: Orthopedics;  Laterality: Right;  . SHOULDER ARTHROSCOPY Bilateral left 11-13-2000;  right 02-18-2018    Family History  Problem Relation Age of Onset  . Heart disease Father   . Diabetes Father     Social History:  Married. Husband works but daughter can assist after discharge. She reports that she has never smoked. She has never used smokeless tobacco. She reports that she does not drink alcohol and does not use drugs.    Allergies  Allergen Reactions  . Compazine Other (See Comments)    Bad headache, vomiting; IV   . Cymbalta [Duloxetine Hcl] Other (See Comments)    "Kept me awake"  . Methadone Hives  . Escitalopram Anxiety  . Macrobid [Nitrofurantoin Monohyd Macro] Rash    Medications Prior to Admission  Medication Sig Dispense Refill  . albuterol (VENTOLIN HFA) 108 (90 Base) MCG/ACT inhaler Inhale 2 puffs into the lungs every 4 (four) hours as needed for wheezing or shortness of breath. 18 g 0  . amLODipine (NORVASC) 5 MG tablet Take 1 tablet (5 mg total) by mouth daily. (Patient taking differently: Take 5 mg by mouth daily with supper. ) 90 tablet 3  . atorvastatin (LIPITOR) 40 MG tablet Take 1 tablet (40 mg total) by mouth daily at 6 PM. 90 tablet 1  . butorphanol (STADOL) 10 MG/ML nasal spray PLACE 1 SPRAY INTO THE NOSE EVERY 4 HOURS AS NEEDED FOR MIGRAINE. (Patient taking differently: Place 1 spray into the nose every 4 (four) hours as needed for headache or migraine. PLACE 1 SPRAY INTO THE NOSE EVERY 4 HOURS AS NEEDED FOR MIGRAINE.) 2.5 mL 5  . glipiZIDE (GLUCOTROL) 10 MG tablet TAKE 2 TABLETS (20 MG TOTAL) BY MOUTH 2 (TWO) TIMES DAILY BEFORE A MEAL. (Patient taking  differently: Take 10 mg by mouth daily before breakfast. ) 360 tablet 0  . insulin NPH Human (HUMULIN N,NOVOLIN N) 100 UNIT/ML injection Inject 0.05 mLs (5 Units total) into the skin 2 (two) times daily before a meal. (Patient taking differently: Inject 25 Units into the skin 2 (two) times daily before a meal. ) 10 mL 0  . levothyroxine (SYNTHROID, LEVOTHROID) 75 MCG tablet Take 1 tablet (75 mcg total) by mouth daily. 90 tablet 1  . olmesartan (BENICAR) 40 MG tablet Take 1 tablet (40 mg total) by mouth daily. 90 tablet 0  . PHENobarbital (LUMINAL) 97.2 MG tablet TAKE 2 TABLETS BY MOUTH ONCE DAILY ON MON,WED,AND FRI AND 1 TABLET DAILY ON OTHER DAYS (Patient taking differently: Take 97.2-194.4 mg by mouth See admin instructions. TAKE 2 TABLETS (194.4 MG TOTALLY) BY MOUTH ON MON, FRI, SUN; TAKE 1 TABLET (97.2 MG TOTALLY) DAILY ON OTHER DAYS) 135 tablet 1  . tiZANidine (ZANAFLEX) 4 MG tablet TAKE 1 TABLET BY MOUTH EVERY 4-6 HOURS AS NEEDED FOR  MUSCLE SPASM (Patient taking differently: Take 4 mg by mouth every 4 (four) hours as needed for muscle spasms. ) 180 tablet 1  . zolpidem (AMBIEN) 10 MG tablet take 1 tablet by mouth at bedtime for sleep if needed. (Patient taking differently: Take 10 mg by mouth at bedtime as needed for sleep. take 1 tablet by mouth at bedtime for sleep if needed.) 90 tablet 1  . Insulin Syringe-Needle U-100 (INSULIN SYRINGE 1CC/31GX5/16") 31G X 5/16" 1 ML MISC 1 Units by Does not apply route as directed. Dx: E11.29 200 each 3  . predniSONE (DELTASONE) 50 MG tablet Take 1 tablet (50 mg total) by mouth daily with breakfast. (Patient not taking: Reported on 09/02/2020) 5 tablet 0    Drug Regimen Review { DRUG REGIMEN [id number]}  Home: Home Living Family/patient expects to be discharged to:: Private residence Living Arrangements: Spouse/significant other, Children Available Help at Discharge: Family, Other (Comment) (reports spouse can likely take off work) Type of Home: House  Home Access: Stairs to enter [employer] of Steps: 4 (previously had ramp, spouse states he can reinstall ramp if needed) Entrance Stairs-Rails: None Home Layout: One level Bathroom Shower/Tub: Multimedia programmer: Standard Home Equipment: Environmental consultant - 2 wheels, Environmental consultant - 4 wheels, [place] - single point, Grab bars - tub/shower, Grab bars - toilet  Lives With: Family (daughers are paramedics)   Functional History: Prior Function Level of Independence: Independent Comments: regained independence following injury last year requiring use of AD  Functional Status:  Mobility: Bed Mobility Overal bed mobility: Needs Assistance Bed Mobility: Supine to Sit Supine to sit: Min guard General bed mobility comments: pt up in chair upon PT arrival Transfers Overall transfer level: Needs assistance Equipment used: Rolling walker (2 wheeled) Transfers: Sit to/from Stand Sit to Stand: Min assist Stand pivot transfers: Mod assist General transfer comment: minA to power up, verbal cues to push up from chair not pull up on walker, increased time Ambulation/Gait Ambulation/Gait assistance: Min assist, +2 safety/equipment Gait Distance (Feet): 60 Feet (x2) Assistive device: Rolling walker (2 wheeled) Gait Pattern/deviations: Step-through pattern, Decreased stride length General Gait Details: short step lenght but able to clear both feet, pt reports R LE not being strong from "breaking it last year". pt with increased UE dependence on RW but no episode of LOB Gait velocity: decresaed Gait velocity interpretation: <1.31 ft/sec, indicative of household ambulator    ADL: ADL Overall ADL's : Needs assistance/impaired Eating/Feeding: Set up, Sitting Grooming: Wash/dry hands, Set up, Sitting Upper Body Bathing: Min guard, Sitting Lower Body Bathing: Moderate assistance, Sit to/from stand Upper Body Dressing : Min guard, Sitting Lower Body Dressing: Moderate assistance, Sit to/from  stand Toilet Transfer: Moderate assistance, Stand-pivot, BSC Toileting- Clothing Manipulation and Hygiene: Moderate assistance, Sit to/from stand Toileting - Clothing Manipulation Details (indicate cue type and reason): assist for balance in standing and gown management while pt completing anterior pericare  Functional mobility during ADLs: Moderate assistance (stand pivot, few steps BSC to recliner )  Cognition: Cognition Overall Cognitive Status: Within Functional Limits for tasks assessed Orientation Level: Oriented X4 Cognition Arousal/Alertness: Awake/alert Behavior During Therapy: WFL for tasks assessed/performed Overall Cognitive Status: Within Functional Limits for tasks assessed General Comments: flat affect, but A&Ox3   Blood pressure 135/62, pulse 78, temperature 98.7 F (37.1 C), temperature source Oral, resp. rate 20, height 4\' 8"  (1.422 m), weight 77.6 kg, SpO2 97 %. Physical Exam Vitals and nursing note reviewed.  Constitutional:      Appearance: Normal appearance.  Musculoskeletal:     Comments: Well healed old incision on right foot  Neurological:     Mental Status: She is alert and oriented to person, place, and time.     Comments: Speech clear. Able to follow commands without difficulty.      Results for orders placed or performed during the hospital encounter of 09/02/20 (from the past 48 hour(s))  Glucose, capillary     Status: Abnormal   Collection Time: 09/04/20 11:52 AM  Result Value Ref Range   Glucose-Capillary 186 (H) 70 - 99 mg/dL    Comment: Glucose reference range applies only to samples taken after fasting for at least 8 hours.  Glucose, capillary     Status: Abnormal   Collection Time: 09/04/20  6:10 PM  Result Value Ref Range   Glucose-Capillary 151 (H) 70 - 99 mg/dL    Comment: Glucose reference range applies only to samples taken after fasting for at least 8 hours.  Glucose, capillary     Status: Abnormal   Collection Time: 09/04/20  9:21  PM  Result Value Ref Range   Glucose-Capillary 198 (H) 70 - 99 mg/dL    Comment: Glucose reference range applies only to samples taken after fasting for at least 8 hours.   Comment 1 Notify RN    Comment 2 Document in Chart   Glucose, capillary     Status: Abnormal   Collection Time: 09/05/20  9:19 AM  Result Value Ref Range   Glucose-Capillary 178 (H) 70 - 99 mg/dL    Comment: Glucose reference range applies only to samples taken after fasting for at least 8 hours.  Glucose, capillary     Status: Abnormal   Collection Time: 09/05/20 12:13 PM  Result Value Ref Range   Glucose-Capillary 325 (H) 70 - 99 mg/dL    Comment: Glucose reference range applies only to samples taken after fasting for at least 8 hours.  Glucose, capillary     Status: Abnormal   Collection Time: 09/05/20  4:53 PM  Result Value Ref Range   Glucose-Capillary 204 (H) 70 - 99 mg/dL    Comment: Glucose reference range applies only to samples taken after fasting for at least 8 hours.  Glucose, capillary     Status: Abnormal   Collection Time: 09/05/20  6:06 PM  Result Value Ref Range   Glucose-Capillary 231 (H) 70 - 99 mg/dL    Comment: Glucose reference range applies only to samples taken after fasting for at least 8 hours.   Comment 1 Document in Chart   Glucose, capillary     Status: Abnormal   Collection Time: 09/05/20  9:15 PM  Result Value Ref Range   Glucose-Capillary 158 (H) 70 - 99 mg/dL    Comment: Glucose reference range applies only to samples taken after fasting for at least 8 hours.  CBC     Status: Abnormal   Collection Time: 09/06/20  4:01 AM  Result Value Ref Range   WBC 5.9 4.0 - 10.5 K/uL   RBC 3.56 (L) 3.87 - 5.11 MIL/uL   Hemoglobin 10.4 (L) 12.0 - 15.0 g/dL   HCT 33.4 (L) 36 - 46 %   MCV 93.8 80.0 - 100.0 fL   MCH 29.2 26.0 - 34.0 pg   MCHC 31.1 30.0 - 36.0 g/dL   RDW 14.5 11.5 - 15.5 %   Platelets 224 150 - 400 K/uL   nRBC 0.0 0.0 - 0.2 %    Comment: Performed at [facility]  [facility], [place] [address], [place],  [address]  Basic metabolic panel     Status: Abnormal   Collection Time: 09/06/20  4:01 AM  Result Value Ref Range   Sodium 141 135 - 145 mmol/L   Potassium 4.1 3.5 - 5.1 mmol/L   Chloride 106 98 - 111 mmol/L   CO2 24 22 - 32 mmol/L   Glucose, Bld 145 (H) 70 - 99 mg/dL    Comment: Glucose reference range applies only to samples taken after fasting for at least 8 hours.   BUN 25 (H) 8 - 23 mg/dL   Creatinine, Ser 1.31 (H) 0.44 - 1.00 mg/dL   Calcium 9.6 8.9 - 10.3 mg/dL   GFR, Estimated 44 (L) >60 mL/min    Comment: (NOTE) Calculated using the CKD-EPI Creatinine Equation (2021)    Anion gap 11 5 - 15    Comment: Performed at [place] [address], [place], [place] [address]  Glucose, capillary     Status: Abnormal   Collection Time: 09/06/20  6:12 AM  Result Value Ref Range   Glucose-Capillary 147 (H) 70 - 99 mg/dL    Comment: Glucose reference range applies only to samples taken after fasting for at least 8 hours.  Glucose, capillary     Status: Abnormal   Collection Time: 09/06/20  8:29 AM  Result Value Ref Range   Glucose-Capillary 180 (H) 70 - 99 mg/dL    Comment: Glucose reference range applies only to samples taken after fasting for at least 8 hours.   No results found.     Medical Problem List and Plan: 1.  *** secondary to ***  -patient may *** shower  -ELOS/Goals: *** 2.  Antithrombotics: -DVT/anticoagulation:  Mechanical: Sequential compression devices, below knee Bilateral lower extremities  -antiplatelet therapy: N/a 3. Chronic pain due to migraines/Pain Management: Stadol or Tylenol prn.  4. Mood: LCSW to follow for evaluation and support.   -antipsychotic agents: N/A 5. Neuropsych: This patient is capable of making decisions on her own behalf. 6. Skin/Wound Care: Routine pressure relief measures.  7. Fluids/Electrolytes/Nutrition: Monitor I/O. Check lytes in am.  8. HTN: Monitor BP tid--continue Norvasc daily.   9. T2DM with peripheral neuropathy: Hgb A1C-8.1. Will monitor BS ac/hs. Continue glucotrol and NPH insulin.  10. Seizure disorder: Managed on home dose Phenobarbital.  11.  CKD: Baseline SCr- has been around 1.6 per records. Now down to 1.3---> will monitor with serial checks. Avoid nephrotoxic medications.  12. ABLA: Recheck CBC in am.  13. Left shoulder/wrist pain: Getting better --reports fall at admission. Shoulder X ray negative--will order wrist films.  Will add Voltaren gel.   ***  Bary Leriche, PA-C 09/06/2020

## 2020-09-06 NOTE — Progress Notes (Signed)
Patient report given to Lilian Kapur, RN at [place] at 1350 in preparation for patient transfer.

## 2020-09-06 NOTE — Progress Notes (Signed)
RN took vitals on pt around 23:45pm and noticed decreased O2 levels of 87% room air. RN put 2L [place] on pt and pt sats came up to 95%. Pt easily arousable and no change in neuro status per assessment.RN will continue to monitor pt. Pt in no acute distress.

## 2020-09-07 ENCOUNTER — Inpatient Hospital Stay (HOSPITAL_COMMUNITY): Payer: Medicare Other

## 2020-09-07 ENCOUNTER — Inpatient Hospital Stay (HOSPITAL_COMMUNITY): Payer: Medicare Other | Admitting: Occupational Therapy

## 2020-09-07 ENCOUNTER — Inpatient Hospital Stay (HOSPITAL_COMMUNITY): Payer: Medicare Other | Admitting: Speech Pathology

## 2020-09-07 DIAGNOSIS — I610 Nontraumatic intracerebral hemorrhage in hemisphere, subcortical: Secondary | ICD-10-CM | POA: Diagnosis not present

## 2020-09-07 LAB — CBC WITH DIFFERENTIAL/PLATELET
Abs Immature Granulocytes: 0.02 10*3/uL (ref 0.00–0.07)
Basophils Absolute: 0 10*3/uL (ref 0.0–0.1)
Basophils Relative: 1 %
Eosinophils Absolute: 0.3 10*3/uL (ref 0.0–0.5)
Eosinophils Relative: 4 %
HCT: 31.3 % — ABNORMAL LOW (ref 36.0–46.0)
Hemoglobin: 10.2 g/dL — ABNORMAL LOW (ref 12.0–15.0)
Immature Granulocytes: 0 %
Lymphocytes Relative: 22 %
Lymphs Abs: 1.3 10*3/uL (ref 0.7–4.0)
MCH: 29.9 pg (ref 26.0–34.0)
MCHC: 32.6 g/dL (ref 30.0–36.0)
MCV: 91.8 fL (ref 80.0–100.0)
Monocytes Absolute: 0.8 10*3/uL (ref 0.1–1.0)
Monocytes Relative: 14 %
Neutro Abs: 3.4 10*3/uL (ref 1.7–7.7)
Neutrophils Relative %: 59 %
Platelets: 205 10*3/uL (ref 150–400)
RBC: 3.41 MIL/uL — ABNORMAL LOW (ref 3.87–5.11)
RDW: 14.4 % (ref 11.5–15.5)
WBC: 5.8 10*3/uL (ref 4.0–10.5)
nRBC: 0 % (ref 0.0–0.2)

## 2020-09-07 LAB — COMPREHENSIVE METABOLIC PANEL
ALT: 17 U/L (ref 0–44)
AST: 17 U/L (ref 15–41)
Albumin: 3.2 g/dL — ABNORMAL LOW (ref 3.5–5.0)
Alkaline Phosphatase: 74 U/L (ref 38–126)
Anion gap: 10 (ref 5–15)
BUN: 25 mg/dL — ABNORMAL HIGH (ref 8–23)
CO2: 25 mmol/L (ref 22–32)
Calcium: 9.5 mg/dL (ref 8.9–10.3)
Chloride: 105 mmol/L (ref 98–111)
Creatinine, Ser: 1.21 mg/dL — ABNORMAL HIGH (ref 0.44–1.00)
GFR, Estimated: 48 mL/min — ABNORMAL LOW (ref >60)
Glucose, Bld: 156 mg/dL — ABNORMAL HIGH (ref 70–99)
Potassium: 3.6 mmol/L (ref 3.5–5.1)
Sodium: 140 mmol/L (ref 135–145)
Total Bilirubin: 0.5 mg/dL (ref 0.3–1.2)
Total Protein: 6 g/dL — ABNORMAL LOW (ref 6.5–8.1)

## 2020-09-07 LAB — GLUCOSE, CAPILLARY
Glucose-Capillary: 149 mg/dL — ABNORMAL HIGH (ref 70–99)
Glucose-Capillary: 178 mg/dL — ABNORMAL HIGH (ref 70–99)
Glucose-Capillary: 236 mg/dL — ABNORMAL HIGH (ref 70–99)
Glucose-Capillary: 233 mg/dL — ABNORMAL HIGH (ref 70–99)

## 2020-09-07 NOTE — Evaluation (Signed)
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 Arthritis   . Atrophy of left kidney   . Cervicalgia   . Chronic kidney disease, stage 3 ([place])    acute aki 03-14-2018 in setting pyelonehritis-- resolved  . Chronic migraine   . Chronic pain syndrome   . History of kidney stones   . History of pyelonephritis    hx recurrent  . History of recurrent UTIs   . History of sepsis    07/ 2017 secondary to pyelonephritis  . Hypertension   . Hypothyroidism   . Iron deficiency anemia   . Left ureteral stone   . Mixed hyperlipidemia   . Nephrolithiasis    right side nonobstructive per CT 03-14-2018  . Right shoulder pain    post sx 02-18-2018  . Seizure disorder ([place]) followed by pcp   per pt dx age 18--  no seizure since 1980's , controlled w/ phenobarbitol  . Type 2 diabetes mellitus ([place])    followed by pcp  . Wears glasses    Past Surgical History:  Past Surgical History:  Procedure Laterality Date  . CESAREAN SECTION  1974  . CHOLECYSTECTOMY OPEN  2002  . CYSTO/  LEFT RETROGRADE PYELOGRAM  06-11-2006   dr Jeffie Pollock  [facility]  . CYSTOSCOPY WITH RETROGRADE PYELOGRAM, URETEROSCOPY AND STENT PLACEMENT Left 04/10/2018   Procedure: CYSTOSCOPY WITH RETROGRADE PYELOGRAM, URETEROSCOPY AND STENT PLACEMENT;  Surgeon: Franchot Gallo, MD;  Location: [facility];  Service: Urology;  Laterality: Left;  . FOOT ARTHRODESIS Right 09/10/2019   Procedure: ARTHRODESIS FOOT;  Surgeon: Erle Crocker, MD;  Location:  [place];  Service: Orthopedics;  Laterality: Right;  . IR URETERAL STENT PLACEMENT EXISTING ACCESS RIGHT  08-21-2007    dr Karsten Ro  [facility]  . LEFT RETROGRADE PYELOGRAM/ LEFT URETERAL STENT PLACEMENT  02-25-2001   dr Jeffie Pollock [facility]  . NEPHROLITHOTOMY Right 05/14/2016   Procedure: NEPHROLITHOTOMY PERCUTANEOUS;  Surgeon: Franchot Gallo, MD;  [facility];  Service: Urology;  Laterality: Right;  . OPEN REDUCTION INTERNAL FIXATION (ORIF) FOOT LISFRANC FRACTURE Right 09/10/2019   Procedure: OPEN REDUCTION INTERNAL FIXATION (ORIF) FOOT LISFRANC FRACTURE;  Surgeon: Erle Crocker, MD;  Location: [place];  Service: Orthopedics;  Laterality: Right;  . REPAIR EXTENSOR TENDON WITH METATARSAL OSTEOTOMY AND OPEN REDUCTION IN Right 09/10/2019   Procedure: OPEN TREATMENT RIGHT FIRST, SECOND, THIRD TARSOMETATARSAL LISFRANC, MIDFOOT FUSION;  Surgeon: Erle Crocker, MD;  Location: [place];  Service: Orthopedics;  Laterality: Right;  . SHOULDER ARTHROSCOPY Bilateral left 11-13-2000;  right 02-18-2018    Assessment / Plan / Recommendation Clinical Impression  HPI: Natalie [name] is a 69 year old RH- emale with history of T2DM with neuropathy, CKD III, chronic pain due to cervicalgia/migraines, recurrent UTIs, seizure d/o- on phenobarbital who was admitted on 09/02/20 with acute onset of left sided weakness with left facial droop and mild slurred speech.  CT head done showing 2 cc acute hematoma right corona radiata with early extension into right  lateral ventricle.  MRI brain showed IPH without underlying lesion, few chronic microhemorrhages as well as mild to moderate chronic ischemic changes.  CTA head/neck was negative for LVO, aneurysm or occlusion.  Bleed felt to be hypertensive in nature with SBP goals <140.   Follow-up CT head showed stable bleed.  Therapy evaluations completed and patient was noted to have balance deficits affecting  mobility and  ADLs.  Pt admitted to CIR 09/06/20 and SLP evaluation was administered 09/07/20 with results as follows:  Pt presented with cognitive-linguistic function WFL on Cognistat evaluation as well as during functional tasks. Her speech was fluent and 100% intelligible. She demonstrated excellent safety awaraness and anticipation of her physical needs at discharge. Expressive/receptive language skills were also WFL. Given that pt is [facility] for all tasks assessed and education completed, no skilled ST services are indicated at this time.   Skilled Therapeutic Interventions          Cognitive-linguistic evaluation was administered and results were reviewed with pt (please see above for details regarding results).   SLP Assessment  Patient does not need any further Speech [place] Pathology Services    Recommendations  Patient destination: Home Follow up Recommendations: None Equipment Recommended: None recommended by SLP         SLP Duration   N/A for ST (7-10 days for PT/OT)          Pain Pain Assessment Pain Scale: 0-10 Pain Score: 0-No pain  Prior Functioning Type of Home: House  Lives With: Family;Spouse Available Help at Discharge: Family;Available 24 hours/day (daughter 24/7, husband parttime)  SLP Evaluation Cognition Overall Cognitive Status: Within Functional Limits for tasks assessed Arousal/Alertness: Awake/alert Orientation Level: Oriented X4 Attention: Sustained;Focused Focused Attention: Appears intact Sustained Attention: Appears intact Memory: Appears intact Immediate Memory Recall: Sock;Bed Memory Recall Blue: Not able to recall Awareness: Appears intact Problem Solving: Appears intact Safety/Judgment: Appears intact  Comprehension Auditory Comprehension Overall Auditory Comprehension: Appears within functional limits for tasks assessed Conversation: Complex Visual Recognition/Discrimination Discrimination: Within Function Limits Reading Comprehension Reading  Status: Within funtional limits Expression Expression Primary Mode of Expression: Verbal Verbal Expression Overall Verbal Expression: Appears within functional limits for tasks assessed Written Expression Dominant Hand: Right Written Expression: Not tested Oral Motor Oral Motor/Sensory Function Overall Oral Motor/Sensory Function: Within functional limits Motor Speech Overall Motor Speech: Appears within functional limits for tasks assessed Intelligibility: Intelligible Motor Planning: Witnin functional limits Motor Speech Errors: Not applicable  Care Tool Care Tool Cognition Expression of Ideas and Wants Expression of Ideas and Wants: Without difficulty (complex and basic) - expresses complex messages without difficulty and with speech that is clear and easy to understand   Understanding Verbal and Non-Verbal Content Understanding Verbal and Non-Verbal Content: Understands (complex and basic) - clear comprehension without cues or repetitions   Memory/Recall Ability *first 3 days only Memory/Recall Ability *first 3 days only: Current season;That he or she is in a hospital/hospital unit;Location of own room     Intelligibility: Intelligible     Short Term Goals: No short term goals set  Recommendations for other services: None   The above assessment, treatment plan, treatment alternatives and goals were discussed and mutually agreed upon: by patient  Arbutus Leas 09/07/2020, 12:03 PM

## 2020-09-07 NOTE — Progress Notes (Signed)
[facility] PHYSICAL MEDICINE & REHABILITATION PROGRESS NOTE   Subjective/Complaints:  Working with OT, min G level, husband at bedside asking about diet restrictions   ROS- denies CP, SOB, N/V/D  Objective:   DG Wrist Complete Left  Result Date: 09/06/2020 CLINICAL DATA:  Pain status post fall EXAM: LEFT WRIST - COMPLETE 3+ VIEW COMPARISON:  None. FINDINGS: There are degenerative changes of the wrist and first Cubero. There is a probable old healed fracture of the distal radius. There is an old fracture of the ulnar styloid process. There is no acute displaced fracture or dislocation on today's study. IMPRESSION: Negative. Electronically Signed   By: Constance Holster M.D.   On: 09/06/2020 19:38   Recent Labs    09/06/20 0401 09/07/20 0504  WBC 5.9 5.8  HGB 10.4* 10.2*  HCT 33.4* 31.3*  PLT 224 205   Recent Labs    09/06/20 0401 09/07/20 0504  NA 141 140  K 4.1 3.6  CL 106 105  CO2 24 25  GLUCOSE 145* 156*  BUN 25* 25*  CREATININE 1.31* 1.21*  CALCIUM 9.6 9.5    Intake/Output Summary (Last 24 hours) at 09/07/2020 0840 Last data filed at 09/06/2020 2126 Gross per 24 hour  Intake 120 ml  Output --  Net 120 ml        Physical Exam: Vital Signs Blood pressure (!) 154/69, pulse 80, temperature 97.9 F (36.6 C), temperature source Oral, resp. rate 16, height 4' 7.98" (1.422 m), weight 78 kg, SpO2 96 %.   General: No acute distress Mood and affect are appropriate Heart: Regular rate and rhythm no rubs murmurs or extra sounds Lungs: Clear to auscultation, breathing unlabored, no rales or wheezes Abdomen: Positive bowel sounds, soft nontender to palpation, nondistended Extremities: No clubbing, cyanosis, or edema Skin: No evidence of breakdown, no evidence of rash Neurologic: Cranial nerves II through XII intact, motor strength is 5/5 in bilateral deltoid, bicep, tricep, grip, hip flexor, knee extensors, ankle dorsiflexor and plantar flexor  Cerebellar exam normal  finger to nose to finger as well as heel to shin in bilateral upper and lower extremities Musculoskeletal: Full range of motion in all 4 extremities. No joint swelling   Assessment/Plan: 1. Functional deficits which require 3+ hours per day of interdisciplinary therapy in a comprehensive inpatient rehab setting.  Physiatrist is providing close team supervision and 24 hour management of active medical problems listed below.  Physiatrist and rehab team continue to assess barriers to discharge/monitor patient progress toward functional and medical goals  Care Tool:  Bathing              Bathing assist       Upper Body Dressing/Undressing Upper body dressing        Upper body assist      Lower Body Dressing/Undressing Lower body dressing            Lower body assist       Toileting Toileting    Toileting assist Assist for toileting: Minimal Assistance - Patient > 75%     Transfers Chair/bed transfer  Transfers assist           Locomotion Ambulation   Ambulation assist              Walk 10 feet activity   Assist           Walk 50 feet activity   Assist           Walk 150 feet activity  Assist           Walk 10 feet on uneven surface  activity   Assist           Wheelchair     Assist               Wheelchair 50 feet with 2 turns activity    Assist            Wheelchair 150 feet activity     Assist          Blood pressure (!) 154/69, pulse 80, temperature 97.9 F (36.6 C), temperature source Oral, resp. rate 16, height 4' 7.98" (1.422 m), weight 78 kg, SpO2 96 %.  Medical Problem List and Plan: 1.  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 Formulized individual treatment plan based on medical history, underlying problem and comorbidities.  2.  Antithrombotics: -DVT/anticoagulation:  Mechanical: Sequential compression devices, below knee Bilateral lower extremities             -antiplatelet therapy: N/a 3. Chronic pain due to migraines/Pain Management: Stadol or Tylenol prn.  4. Mood: LCSW to follow for evaluation and support.              -antipsychotic agents: N/A 5. Neuropsych: This patient is capable of making decisions on her own behalf. 6. Skin/Wound Care: Routine pressure relief measures.  7. Fluids/Electrolytes/Nutrition: Monitor I/O. Check lytes in am.  8. HTN: Monitor BP tid--continue Norvasc daily.  Vitals:   09/06/20 1621 09/07/20 0337  BP: (!) 149/72 (!) 154/69  Pulse: 80 80  Resp: 16 16  Temp: 98.9 F (37.2 C) 97.9 F (36.6 C)  SpO2:  96%  elevated sys BP will monitor for 1-2 d prior to amlodipine dose adjustment  9. T2DM with peripheral neuropathy: Hgb A1C-8.1. Will monitor BS ac/hs. Continue glucotrol and NPH insulin.  CBG (last 3)  Recent Labs    09/06/20 1622 09/06/20 2114 09/07/20 0620  GLUCAP 149* 270* 149*  some lability cont to monitor   10. Seizure disorder: Managed on home dose Phenobarbital. No seizure for 30+ years on meds 11.  CKD: Baseline SCr- has been around 1.6 per records. Now down to 1.3---> will monitor with serial checks. Avoid nephrotoxic medications.  12. ABLA: Recheck CBC in am.  13. Left shoulder/wrist pain: Getting better --reports fall at admission. Shoulder X ray negative--, wrist xray shows healt Colles fx  Will add Voltaren gel.     LOS: 1 days A FACE TO FACE EVALUATION WAS PERFORMED  Charlett Blake 09/07/2020, 8:40 AM

## 2020-09-07 NOTE — Progress Notes (Signed)
Resting at interval throughout shift in no acute distress, monitor and assisted. Staff assistance to BR initially x2, but afterward steady x 2 needed due to increase weakness to extremities. Call bell within reach, bed alarm on , Continue regime

## 2020-09-07 NOTE — Progress Notes (Signed)
[facility] Individual Statement of Services  Patient Name:  Natalie [name]  Date:  09/07/2020  Welcome to the [facility].  Our goal is to provide you with an individualized program based on your diagnosis and situation, designed to meet your specific needs.  With this comprehensive rehabilitation program, you will be expected to participate in at least 3 hours of rehabilitation therapies Monday-Friday, with modified therapy programming on the weekends.  Your rehabilitation program will include the following services:  Physical Therapy (PT), Occupational Therapy (OT), Speech Therapy (ST), 24 hour per day rehabilitation nursing, Therapeutic Recreaction (TR), Neuropsychology, Care Coordinator, Rehabilitation Medicine, Nutrition Services, Pharmacy Services and Other  Weekly team conferences will be held on Wednesday to discuss your progress.  Your Inpatient Rehabilitation Care Coordinator will talk with you frequently to get your input and to update you on team discussions.  Team conferences with you and your family in attendance may also be held.  Expected length of stay: 7-10 Days  Overall anticipated outcome: MOD I  Depending on your progress and recovery, your program may change. Your Inpatient Rehabilitation Care Coordinator will coordinate services and will keep you informed of any changes. Your Inpatient Rehabilitation Care Coordinator's name and contact numbers are listed  below.  The following services may also be recommended but are not provided by the [facility]:    [place] will be made to provide these services after discharge if needed.  Arrangements include referral to agencies that provide these services.  Your insurance has been verified to be:  Medicare A&B Your primary doctor is:  Delia Chimes, MD  Pertinent information will be  shared with your doctor and your insurance company.  Inpatient Rehabilitation Care Coordinator:  Erlene Quan, [place] or [phone]  Information discussed with and copy given to patient by: Dyanne Iha, 09/07/2020, 1:05 PM

## 2020-09-07 NOTE — Evaluation (Signed)
Occupational Therapy Assessment and Plan  Patient Details  Name: Natalie [name] MRN: [id number] Date of Birth: May 28, 1951  OT Diagnosis: hemiplegia affecting non-dominant side and decreased activity tolerance, decreased sensation in LLE/LUE Rehab Potential: Rehab Potential (ACUTE ONLY): Good ELOS: 7-9 days    Today's Date: 09/07/2020 OT Individual Time:  -        Hospital Problem: Principal Problem:   R corona radiata ICH (intracerebral hemorrhage) ([place]) w/ IVH d/t HTN   Past Medical History:  Past Medical History:  Diagnosis Date  . Arthritis   . Atrophy of left kidney   . Cervicalgia   . Chronic kidney disease, stage 3 ([place])    acute aki 03-14-2018 in setting pyelonehritis-- resolved  . Chronic migraine   . Chronic pain syndrome   . History of kidney stones   . History of pyelonephritis    hx recurrent  . History of recurrent UTIs   . History of sepsis    07/ 2017 secondary to pyelonephritis  . Hypertension   . Hypothyroidism   . Iron deficiency anemia   . Left ureteral stone   . Mixed hyperlipidemia   . Nephrolithiasis    right side nonobstructive per CT 03-14-2018  . Right shoulder pain    post sx 02-18-2018  . Seizure disorder ([place]) followed by pcp   per pt dx age 38--  no seizure since 1980's , controlled w/ phenobarbitol  . Type 2 diabetes mellitus ([place])    followed by pcp  . Wears glasses    Past Surgical History:  Past Surgical History:  Procedure Laterality Date  . CESAREAN SECTION  1974  . CHOLECYSTECTOMY OPEN  2002  . CYSTO/  LEFT RETROGRADE PYELOGRAM  06-11-2006   dr Jeffie Pollock  [facility]  . CYSTOSCOPY WITH RETROGRADE PYELOGRAM, URETEROSCOPY AND STENT PLACEMENT Left 04/10/2018   Procedure: CYSTOSCOPY WITH RETROGRADE PYELOGRAM, URETEROSCOPY AND STENT PLACEMENT;  Surgeon: Franchot Gallo, MD;  Location: [facility];  Service: Urology;  Laterality: Left;  . FOOT ARTHRODESIS Right 09/10/2019   Procedure: ARTHRODESIS FOOT;  Surgeon: Erle Crocker, MD;  Location: [facility];  Service: Orthopedics;  Laterality: Right;  . IR URETERAL STENT PLACEMENT EXISTING ACCESS RIGHT  08-21-2007    dr Karsten Ro  [facility]  . LEFT RETROGRADE PYELOGRAM/ LEFT URETERAL STENT PLACEMENT  02-25-2001   dr Jeffie Pollock [facility]  . NEPHROLITHOTOMY Right 05/14/2016   Procedure: NEPHROLITHOTOMY PERCUTANEOUS;  Surgeon: Franchot Gallo, MD;  Location: [facility];  Service: Urology;  Laterality: Right;  . OPEN REDUCTION INTERNAL FIXATION (ORIF) FOOT LISFRANC FRACTURE Right 09/10/2019   Procedure: OPEN REDUCTION INTERNAL FIXATION (ORIF) FOOT LISFRANC FRACTURE;  Surgeon: Erle Crocker, MD;  Location: [facility];  Service: Orthopedics;  Laterality: Right;  . REPAIR EXTENSOR TENDON WITH METATARSAL OSTEOTOMY AND OPEN REDUCTION IN Right 09/10/2019   Procedure: OPEN TREATMENT RIGHT FIRST, SECOND, THIRD TARSOMETATARSAL LISFRANC, MIDFOOT FUSION;  Surgeon: Erle Crocker, MD;  Location: [facility];  Service: Orthopedics;  Laterality: Right;  . SHOULDER ARTHROSCOPY Bilateral left 11-13-2000;  right 02-18-2018    Assessment & Plan Clinical Impression: Patient is a 69 y.o. year old female with recent admission to the hospital on 09/02/2020 with with history of T2DM with neuropathy, CKD III, chronic pain due to cervicalgia/migraines, recurrent UTIs, seizure d/o- on phenobarbital who was admitted on 09/02/20 with acute onset of left sided weakness with left facial droop and mild slurred speech.  CT head done showing 2 cc acute  hematoma right corona radiata with early extension into right lateral ventricle.  MRI brain showed IPH without underlying lesion, few chronic microhemorrhages as well as mild to moderate chronic ischemic changes.  Patient transferred to CIR on 09/06/2020 .    Patient currently requires supervision with basic self-care skills secondary to mild L hemiplegia.  Prior to hospitalization, patient could complete ADLs/IADLs/  func mobility with independent .  Patient will benefit from skilled intervention to increase independence with basic self-care skills and increase level of independence with iADL prior to discharge home with care partner.  Anticipate patient will require 24 hour supervision and no further OT follow recommended.  OT - End of Session Activity Tolerance: Tolerates 10 - 20 min activity with multiple rests Endurance Deficit: Yes Endurance Deficit Description: generalized weakness, decreased activity tolerance from baseline OT Assessment Rehab Potential (ACUTE ONLY): Good OT Patient demonstrates impairments in the following area(s): Balance;Sensory;Endurance;Motor OT Basic ADL's Functional Problem(s): Grooming;Bathing;Dressing;Toileting OT Advanced ADL's Functional Problem(s): Simple Meal Preparation;Full Meal Preparation;Laundry;Light Housekeeping OT Transfers Functional Problem(s): Tub/Shower;Toilet OT Additional Impairment(s): None OT Plan OT Intensity: Minimum of 1-2 x/day, 45 to 90 minutes OT Frequency: 5 out of 7 days OT Duration/Estimated Length of Stay: 7-9 days OT Treatment/Interventions: Balance/vestibular training;Disease mangement/prevention;Neuromuscular re-education;Self Care/advanced ADL retraining;Therapeutic Exercise;UE/LE Strength taining/ROM;Pain management;DME/adaptive equipment instruction;Discharge planning;Functional mobility training;Psychosocial support;Therapeutic Activities;UE/LE Coordination activities;Patient/family education;Community reintegration OT Self Feeding Anticipated Outcome(s): ind OT Basic Self-Care Anticipated Outcome(s): mod I OT Toileting Anticipated Outcome(s): mod I OT Bathroom Transfers Anticipated Outcome(s): supervision OT Recommendation Patient destination: Home Follow Up Recommendations: None Equipment Recommended: None recommended by OT (pt has TTB, bathroom grab bars, ramp, RW at home)   OT Evaluation Precautions/Restrictions    Therapy  Vitals Temp: 98.1 F (36.7 C) Pulse Rate: 83 Resp: 16 BP: (!) 149/58 Patient Position (if appropriate): Sitting Oxygen Therapy SpO2: 97 % O2 Device: Room Air Pain   Home Living/Prior Functioning Home Living Family/patient expects to be discharged to:: Private residence Living Arrangements: Spouse/significant other, Children Available Help at Discharge: Family, Available 24 hours/day (daughter 24/7, husband parttime) Type of Home: House Home Access: Stairs to enter [employer] of Steps: 4 (previously had ramp, spouse states he can reinstall ramp if needed) Entrance Stairs-Rails: None Home Layout: One level Bathroom Shower/Tub: Gaffer, Chiropodist: Programmer, systems: Yes  Lives With: Family, Spouse IADL History Homemaking Responsibilities: Yes Meal Prep Responsibility: Primary Laundry Responsibility: Primary Cleaning Responsibility: Primary Bill Paying/Finance Responsibility: Primary Shopping Responsibility: Primary Child Care Responsibility: Secondary Homemaking Comments: contributes to caring for 2 mo great grand child Current License: Yes Occupation: Unemployed Prior Function Level of Independence: Independent with homemaking with ambulation, Independent with gait, Independent with transfers, Independent with basic ADLs  Able to Take Stairs?: Yes Comments: regained independence following injury to R foot last year requiring use of AD, but was not needing it prior to this admission Vision Baseline Vision/History: Wears glasses;Cataracts Wears Glasses: Reading only Patient Visual Report: No change from baseline Vision Assessment?: Yes Visual Fields: No apparent deficits Perception  Perception: Within Functional Limits Praxis Praxis: Intact Cognition Overall Cognitive Status: Within Functional Limits for tasks assessed Arousal/Alertness: Awake/alert Orientation Level: Person;Place;Situation Person:  Oriented Place: Oriented Situation: Oriented Year: 2021 Month: December Day of Week: Correct Memory: Appears intact Immediate Memory Recall: Sock;Blue;Bed Memory Recall Sock: Without Cue Memory Recall Blue: Not able to recall Memory Recall Bed: Without Cue Attention: Sustained;Focused Focused Attention: Appears intact Sustained Attention: Appears intact Awareness: Appears intact Problem Solving: Appears intact Safety/Judgment: Appears  intact Sensation Sensation Light Touch: Impaired by gross assessment Hot/Cold: Not tested Proprioception: Impaired by gross assessment Proprioception Impaired Details: Impaired LUE;Impaired LLE Stereognosis: Not tested Additional Comments: Light touch mod impaired on LUE/LLE - able to localize light touch proximally + distally but reports it feels "numb." Propioception mildly impaired in digits on LUE. Coordination Gross Motor Movements are Fluid and Coordinated: Yes Fine Motor Movements are Fluid and Coordinated: Yes Coordination and Movement Description: grossly WFL during grooming, dressing, showering Finger Nose Finger Test: [facility] Heel Shin Test: [facility] Motor  Motor Motor: Other (comment) (mild L hemiparesis) Motor - Skilled Clinical Observations: mild LUE/LLE hemiparesis, MMT at -4/5  Trunk/Postural Assessment  Cervical Assessment Cervical Assessment: Within Functional Limits Thoracic Assessment Thoracic Assessment: Within Functional Limits Lumbar Assessment Lumbar Assessment: Within Functional Limits Postural Control Postural Control: Deficits on evaluation Righting Reactions: delayed and inadequate Protective Responses: delayed and inadequate  Balance Balance Balance Assessed: Yes Standardized Balance Assessment Standardized Balance Assessment: Berg Balance Test Berg Balance Test Sit to Stand: Needs minimal aid to stand or to stabilize Standing Unsupported: Able to stand 2 minutes with supervision Sitting with Back Unsupported but  Feet Supported on Floor or Stool: Able to sit safely and securely 2 minutes Stand to Sit: Controls descent by using hands Transfers: Able to transfer with verbal cueing and /or supervision Standing Unsupported with Eyes Closed: Able to stand 10 seconds with supervision Standing Ubsupported with Feet Together: Needs help to attain position and unable to hold for 15 seconds From Standing, Reach Forward with Outstretched Arm: Loses balance while trying/requires external support From Standing Position, Pick up Object from Floor: Unable to try/needs assist to keep balance From Standing Position, Turn to Look Behind Over each Shoulder: Needs assist to keep from losing balance and falling Turn 360 Degrees: Needs assistance while turning Standing Unsupported, Alternately Place Feet on Step/Stool: Needs assistance to keep from falling or unable to try Standing Unsupported, One Foot in Front: Loses balance while stepping or standing Standing on One Leg: Unable to try or needs assist to prevent fall Total Score: 16 Static Sitting Balance Static Sitting - Balance Support: Feet supported Static Sitting - Level of Assistance: 5: Stand by assistance Dynamic Sitting Balance Dynamic Sitting - Balance Support: Feet supported Dynamic Sitting - Level of Assistance: 5: Stand by assistance Dynamic Sitting - Balance Activities: Other (comment) (donning pants) Static Standing Balance Static Standing - Balance Support: Bilateral upper extremity supported Static Standing - Level of Assistance: 5: Stand by assistance Dynamic Standing Balance Dynamic Standing - Balance Support: Bilateral upper extremity supported;During functional activity Dynamic Standing - Level of Assistance: 5: Stand by assistance  Care Tool Care Tool Self Care Eating   Eating Assist Level: Set up assist    Oral Care    Oral Care Assist Level: Independent    Bathing              Upper Body Dressing(including orthotics)             Lower Body Dressing (excluding footwear)          Putting on/Taking off footwear             Care Tool Toileting Toileting activity   Assist for toileting: Minimal Assistance - Patient > 75%     Care Tool Bed Mobility Roll left and right activity   Roll left and right assist level: Supervision/Verbal cueing    Sit to lying activity   Sit to lying assist level: Supervision/Verbal cueing  Lying to sitting edge of bed activity   Lying to sitting edge of bed assist level: Supervision/Verbal cueing     Care Tool Transfers Sit to stand transfer   Sit to stand assist level: Supervision/Verbal cueing    Chair/bed transfer   Chair/bed transfer assist level: Contact Guard/Touching assist     Toilet transfer   Assist Level: Contact Guard/Touching assist     Care Tool Cognition Expression of Ideas and Wants     Understanding Verbal and Non-Verbal Content     Memory/Recall Ability *first 3 days only      Refer to Care Plan for [place] 1    Recommendations for other services: None    Skilled Therapeutic Intervention ADL ADL Eating: Independent (seated) Grooming: Supervision/safety Where Assessed-Grooming: Edge of bed Upper Body Bathing: Supervision/safety Where Assessed-Upper Body Bathing: Shower (TTB) Lower Body Bathing: Supervision/safety Where Assessed-Lower Body Bathing: Shower (TTB) Upper Body Dressing: Supervision/safety Where Assessed-Upper Body Dressing: Edge of bed Lower Body Dressing: Supervision/safety Where Assessed-Lower Body Dressing: Edge of bed Toileting: Supervision/safety Where Assessed-Toileting: Glass blower/designer: Close supervision Armed forces technical officer Method: Magazine features editor: Close supervision Social research officer, government Method: Heritage manager: Manufacturing systems engineer  Bed Mobility Bed Mobility: Rolling Right;Rolling Left;Supine to Sit;Sit to Supine Rolling  Right: Supervision/verbal cueing Rolling Left: Supervision/Verbal cueing Supine to Sit: Supervision/Verbal cueing Sit to Supine: Supervision/Verbal cueing Transfers Sit to Stand: Supervision/Verbal cueing Stand to Sit: Supervision/Verbal cueing  Pt received semi-reclined in bed with husband present, bed alarm engaged. Pt performed bed mobility, ambulation to bathroom, shower t/f, full-body bathing on TTB, and full-body dressing EOB at close supervision level with youth sized RW 2/2 minimal L hemiplegia + decreased sensation in LUE/LLE. Pt will have 24/7 supervision/assist from daughter upon DC home. Pt husband reports of "slurred speech" during last year, pt reports no concerns. Pt reports 1 fall one year ago, etiology unclear. Anticipates she will be using tub/shower combo as her walk-in is too small. Pt left in w/c with safety belt alarm engaged, call bell in reach, husband present, all immediate needs met.   Discharge Criteria: Patient will be discharged from OT if patient refuses treatment 3 consecutive times without medical reason, if treatment goals not met, if there is a change in medical status, if patient makes no progress towards goals or if patient is discharged from hospital.  The above assessment, treatment plan, treatment alternatives and goals were discussed and mutually agreed upon: by patient and by family  Volanda Napoleon MS, OTR/L  09/07/2020, 4:14 PM

## 2020-09-07 NOTE — Patient Care Conference (Signed)
Inpatient RehabilitationTeam Conference and Plan of Care Update Date: 09/07/2020   Time: 10:18 AM    Patient Name: Natalie [name]      Medical Record Number: [id number]  Date of Birth: 1951/09/30 Sex: Female         Room/Bed: 4W21C/4W21C-01 Payor Info: Payor: MEDICARE / Plan: MEDICARE PART A AND B / Product Type: *No Product type* /    Admit Date/Time:  09/06/2020  3:49 PM  Primary Diagnosis:  ICH (intracerebral hemorrhage) [facility])  Hospital Problems: Principal Problem:   R corona radiata ICH (intracerebral hemorrhage) ([place]) w/ IVH d/t HTN    Expected Discharge Date: Expected Discharge Date:  (initial evals pending)  Team Members Present: Physician leading conference: Dr. Alysia Penna Care Coodinator Present: Dorien Chihuahua, RN, BSN, CRRN;Christina Wampum, [place] Nurse Present: Benjie Karvonen, RN PT Present: Barrie Folk, PT OT Present: Mariane Masters, OT SLP Present: Jettie Booze, CF-SLP PPS Coordinator present : Ileana Ladd, Burna Mortimer, SLP     Current Status/Progress Goal Weekly Team Focus  Bowel/Bladder   Continent of B/B, LBM12/7/21  Maintain normalcy with B/B  QS/PRN aassessment,of toileting needs   Swallow/Nutrition/ Hydration             ADL's   close supervision for full body dressing EOB, full-body bathing in shower, shower transfer, bed mobility  mod I  LUE NMR, pt/family edu, selfcare retraining, balance retraining, func transfer retraining   Mobility             Communication             Safety/Cognition/ Behavioral Observations  Evaluation pending         Pain   Occassional c/o headache, and generalize aching upper/lower body  <= 3/10  Assess QS/PRN pain provided meds and alternate measures to relief   Skin   Skin intact, some nored MASD-groin areas, ecchymosis to aras/abdomen  Maintain skin integrity while in [facility]  Assess QS/PRN, monitor and document changes     Discharge Planning:      Team Discussion: Patient post right cornona  radiata ICH with DM and obesity. Patient is continent of bowel and bladder. Progress impaired by shoulder and wrist pain; xrays negative. Note bruising on left elbow; xray negative per MD.  Patient on target to meet rehab goals: yes, mod I goals set. Currently close supervision to CG assist with mild left  upper extremity weakness noted. Min assist level overall. SLP eval pending  *See Care Plan and progress notes for long and short-term goals.   Revisions to Treatment Plan:   Teaching Needs: Transfers, toileting, medications, etc.  Current Barriers to Discharge: Decreased caregiver support, Home enviroment access/layout and 4 step entry to home  Possible Resolutions to Barriers: Family education Ramp for entry to home    Medical Summary Current Status: Labile HTN, some intermittent shoulder and wrist pain , cont of bowel and  bladder, no sig skin issues  Barriers to Discharge: Medical stability;Weight   Possible Resolutions to [employer] Focus: monitor and adjust BP and meds   Continued Need for Acute Rehabilitation Level of Care: The patient requires daily medical management by a physician with specialized training in physical medicine and rehabilitation for the following reasons: Direction of a multidisciplinary physical rehabilitation program to maximize functional independence : Yes Medical management of patient stability for increased activity during participation in an intensive rehabilitation regime.: Yes Analysis of laboratory values and/or radiology reports with any subsequent need for medication adjustment and/or medical intervention. : Yes  I attest that I was present, lead the team conference, and concur with the assessment and plan of the team.   Dorien Chihuahua B 09/07/2020, 2:47 PM

## 2020-09-07 NOTE — Evaluation (Signed)
Physical Therapy Assessment and Plan  Patient Details  Name: Natalie [name] MRN: [id number] Date of Birth: [birth year]  PT Diagnosis: Abnormality of gait, Coordination disorder, Difficulty walking, Hemiparesis non-dominant, Impaired sensation and Muscle weakness Rehab Potential: Good ELOS: 7-10 days   Today's Date: 09/07/2020 PT Individual Time: 1400-1500 PT Individual Time Calculation (min): 60 min    Hospital Problem: Principal Problem:   R corona radiata ICH (intracerebral hemorrhage) ([place]) w/ IVH d/t HTN   Past Medical History:  Past Medical History:  Diagnosis Date  . Arthritis   . Atrophy of left kidney   . Cervicalgia   . Chronic kidney disease, stage 3 ([place])    acute aki 03-14-2018 in setting pyelonehritis-- resolved  . Chronic migraine   . Chronic pain syndrome   . History of kidney stones   . History of pyelonephritis    hx recurrent  . History of recurrent UTIs   . History of sepsis    07/ 2017 secondary to pyelonephritis  . Hypertension   . Hypothyroidism   . Iron deficiency anemia   . Left ureteral stone   . Mixed hyperlipidemia   . Nephrolithiasis    right side nonobstructive per CT 03-14-2018  . Right shoulder pain    post sx 02-18-2018  . Seizure disorder ([place]) followed by pcp   per pt dx age 16--  no seizure since 1980's , controlled w/ phenobarbitol  . Type 2 diabetes mellitus ([place])    followed by pcp  . Wears glasses    Past Surgical History:  Past Surgical History:  Procedure Laterality Date  . CESAREAN SECTION  1974  . CHOLECYSTECTOMY OPEN  2002  . CYSTO/  LEFT RETROGRADE PYELOGRAM  06-11-2006   dr Jeffie Pollock  [facility]  . CYSTOSCOPY WITH RETROGRADE PYELOGRAM, URETEROSCOPY AND STENT PLACEMENT Left 04/10/2018   Procedure: CYSTOSCOPY WITH RETROGRADE PYELOGRAM, URETEROSCOPY AND STENT PLACEMENT;  Surgeon: Franchot Gallo, MD;  Location: [facility];  Service: Urology;  Laterality: Left;  . FOOT ARTHRODESIS Right 09/10/2019   Procedure:  ARTHRODESIS FOOT;  Surgeon: Erle Crocker, MD;  Location: [place];  Service: Orthopedics;  Laterality: Right;  . IR URETERAL STENT PLACEMENT EXISTING ACCESS RIGHT  08-21-2007    dr Karsten Ro  [facility]  . LEFT RETROGRADE PYELOGRAM/ LEFT URETERAL STENT PLACEMENT  02-25-2001   dr Jeffie Pollock [facility]  . NEPHROLITHOTOMY Right 05/14/2016   Procedure: NEPHROLITHOTOMY PERCUTANEOUS;  Surgeon: Franchot Gallo, MD;  Location: [facility];  Service: Urology;  Laterality: Right;  . OPEN REDUCTION INTERNAL FIXATION (ORIF) FOOT LISFRANC FRACTURE Right 09/10/2019   Procedure: OPEN REDUCTION INTERNAL FIXATION (ORIF) FOOT LISFRANC FRACTURE;  Surgeon: Erle Crocker, MD;  Location: [place];  Service: Orthopedics;  Laterality: Right;  . REPAIR EXTENSOR TENDON WITH METATARSAL OSTEOTOMY AND OPEN REDUCTION IN Right 09/10/2019   Procedure: OPEN TREATMENT RIGHT FIRST, SECOND, THIRD TARSOMETATARSAL LISFRANC, MIDFOOT FUSION;  Surgeon: Erle Crocker, MD;  Location: [place];  Service: Orthopedics;  Laterality: Right;  . SHOULDER ARTHROSCOPY Bilateral left 11-13-2000;  right 02-18-2018    Assessment & Plan Clinical Impression: Patient is a 69 y.o. year old female with history of T2DM with neuropathy, CKD III, chronic pain due to cervicalgia/migraines, recurrent UTIs, seizure d/o- on phenobarbital who was admitted on 09/02/20 with acute onset of left sided weakness with left facial droop and mild slurred speech.  CT head done showing 2 cc acute hematoma right corona radiata with early extension into right lateral ventricle.  MRI brain showed IPH without underlying lesion, few chronic microhemorrhages as well as mild to moderate chronic ischemic changes.  CTA head/neck was negative for LVO, aneurysm or occlusion.  Bleed felt to be hypertensive in nature with SBP goals <140.   Follow-up CT head showed stable bleed.  Therapy evaluations completed and patient was noted to have  balance deficits affecting  mobility and ADLs.  CIR was recommended due to functional decline.  Patient currently requires min with mobility secondary to muscle weakness, decreased cardiorespiratoy endurance, unbalanced muscle activation and decreased coordination, decreased motor planning and decreased sitting balance, decreased standing balance, decreased postural control, hemiplegia and decreased balance strategies.  Prior to hospitalization, patient was independent  with mobility and lived with Family, Spouse in a House home.  Home access is  Stairs to enter.  Patient will benefit from skilled PT intervention to maximize safe functional mobility, minimize fall risk and decrease caregiver burden for planned discharge home with 24 hour supervision.  Anticipate patient will benefit from follow up OP at discharge.  PT - End of Session Activity Tolerance: Tolerates 30+ min activity with multiple rests Endurance Deficit: Yes PT Assessment Rehab Potential (ACUTE/IP ONLY): Good PT Barriers to Discharge: Lack of/limited family support PT Patient demonstrates impairments in the following area(s): Balance;Endurance;Motor;Nutrition;Pain;Perception;Safety;Sensory PT Transfers Functional Problem(s): Bed Mobility;Bed to Chair;Car;Furniture PT Locomotion Functional Problem(s): Ambulation;Stairs PT Plan PT Intensity: Minimum of 1-2 x/day ,45 to 90 minutes PT Frequency: 5 out of 7 days PT Duration Estimated Length of Stay: 7-10 days PT Treatment/Interventions: Ambulation/gait training;Discharge planning;Functional mobility training;Psychosocial support;Therapeutic Activities;Visual/perceptual remediation/compensation;Balance/vestibular training;Disease management/prevention;Neuromuscular re-education;Skin care/wound management;Therapeutic Exercise;Wheelchair propulsion/positioning;Cognitive remediation/compensation;DME/adaptive equipment instruction;Pain management;Splinting/orthotics;UE/LE Strength  taining/ROM;Community reintegration;Functional electrical stimulation;Patient/family education;Stair training;UE/LE Coordination activities PT Transfers Anticipated Outcome(s): SPV PT Locomotion Anticipated Outcome(s): SPV PT Recommendation Recommendations for Other Services: Neuropsych consult;Therapeutic Recreation consult Therapeutic Recreation Interventions: Stress management;Kitchen group Follow Up Recommendations: Home health PT;Outpatient PT Patient destination: Home Equipment Recommended: Rolling walker with 5" wheels Equipment Details: pediatric RW   PT Evaluation Precautions/Restrictions Precautions Precautions: None;Other (comment) Restrictions Weight Bearing Restrictions: No Other Position/Activity Restrictions: SBP <140 Home Living/Prior Functioning Home Living Available Help at Discharge: Family;Available 24 hours/day Type of Home: House Home Access: Stairs to enter [employer] of Steps: 4 (previously had ramp, spouse states he can reinstall ramp if needed) Entrance Stairs-Rails: None Home Layout: One level Bathroom Shower/Tub: Multimedia programmer: Standard Bathroom Accessibility: Yes  Lives With: Family Prior Function Level of Independence: Independent with homemaking with ambulation;Independent with gait;Independent with transfers;Independent with basic ADLs  Able to Take Stairs?: Yes Comments: regained independence following injury to R foot last year requiring use of AD Vision/Perception  Perception Perception: Within Functional Limits Praxis Praxis: Intact  Cognition Overall Cognitive Status: Within Functional Limits for tasks assessed Arousal/Alertness: Awake/alert Orientation Level: Oriented X4 Focused Attention: Appears intact Problem Solving: Appears intact Safety/Judgment: Appears intact Sensation Sensation Light Touch: Impaired by gross assessment (impaired L LE distal) Hot/Cold: Not tested Proprioception: Impaired by  gross assessment (impaired L LE at hallux and ankle) Proprioception Impaired Details: Impaired LUE;Impaired LLE Stereognosis: Not tested Coordination Gross Motor Movements are Fluid and Coordinated: No Fine Motor Movements are Fluid and Coordinated: Yes Finger Nose Finger Test: [facility] Heel Shin Test: [facility] Motor  Motor Motor: Hemiplegia Motor - Skilled Clinical Observations: LUE/LLE hemiplegia, MMT at 4-/5   Trunk/Postural Assessment  Cervical Assessment Cervical Assessment: Within Functional Limits Thoracic Assessment Thoracic Assessment: Within Functional Limits Lumbar Assessment Lumbar Assessment: Within Functional Limits Postural Control Postural Control: Deficits  on evaluation Righting Reactions: delayed and inadequate Protective Responses: delayed and inadequate  Balance Balance Balance Assessed: Yes Standardized Balance Assessment Standardized Balance Assessment: Berg Balance Test Berg Balance Test Sit to Stand: Needs minimal aid to stand or to stabilize Standing Unsupported: Able to stand 2 minutes with supervision Sitting with Back Unsupported but Feet Supported on Floor or Stool: Able to sit safely and securely 2 minutes Stand to Sit: Controls descent by using hands Transfers: Able to transfer with verbal cueing and /or supervision Standing Unsupported with Eyes Closed: Able to stand 10 seconds with supervision Standing Ubsupported with Feet Together: Needs help to attain position and unable to hold for 15 seconds From Standing, Reach Forward with Outstretched Arm: Loses balance while trying/requires external support From Standing Position, Pick up Object from Floor: Unable to try/needs assist to keep balance From Standing Position, Turn to Look Behind Over each Shoulder: Needs assist to keep from losing balance and falling Turn 360 Degrees: Needs assistance while turning Standing Unsupported, Alternately Place Feet on Step/Stool: Needs assistance to keep from falling or  unable to try Standing Unsupported, One Foot in Front: Loses balance while stepping or standing Standing on One Leg: Unable to try or needs assist to prevent fall Total Score: 16 Static Sitting Balance Static Sitting - Balance Support: Feet supported Static Sitting - Level of Assistance: 7: Independent Dynamic Sitting Balance Dynamic Sitting - Balance Support: Feet supported;During functional activity Dynamic Sitting - Level of Assistance: 5: Stand by assistance Static Standing Balance Static Standing - Balance Support: Bilateral upper extremity supported Static Standing - Level of Assistance: 5: Stand by assistance Dynamic Standing Balance Dynamic Standing - Balance Support: During functional activity Dynamic Standing - Level of Assistance: 4: Min assist Extremity Assessment      RLE Assessment RLE Assessment: Exceptions to [facility] RLE Strength Right Hip Flexion: 4-/5 Right Hip ABduction: 4/5 Right Hip ADduction: 4/5 Right Knee Flexion: 4/5 Right Knee Extension: 4/5 Right Ankle Dorsiflexion:  (deferred due to pain related to R foot fx) Right Ankle Plantar Flexion: 4/5 LLE Assessment LLE Assessment: Exceptions to [facility] LLE Strength Left Hip Flexion: 4-/5 Left Hip ABduction: 4-/5 Left Hip ADduction: 4-/5 Left Knee Flexion: 3+/5 Left Knee Extension: 4-/5 Left Ankle Dorsiflexion: 4-/5 Left Ankle Plantar Flexion: 4-/5  Care Tool Care Tool Bed Mobility Roll left and right activity   Roll left and right assist level: Supervision/Verbal cueing    Sit to lying activity   Sit to lying assist level: Supervision/Verbal cueing    Lying to sitting edge of bed activity   Lying to sitting edge of bed assist level: Supervision/Verbal cueing     Care Tool Transfers Sit to stand transfer   Sit to stand assist level: Supervision/Verbal cueing    Chair/bed transfer   Chair/bed transfer assist level: Contact Guard/Touching assist     Toilet transfer   Assist Level: Contact  Guard/Touching assist    Car transfer   Car transfer assist level: Contact Guard/Touching assist      Care Tool Locomotion Ambulation   Assist level: Contact Guard/Touching assist Assistive device: Walker-rolling Max distance: 300  Walk 10 feet activity   Assist level: Contact Guard/Touching assist Assistive device: Walker-rolling   Walk 50 feet with 2 turns activity   Assist level: Contact Guard/Touching assist Assistive device: Walker-rolling  Walk 150 feet activity   Assist level: Contact Guard/Touching assist Assistive device: Walker-rolling  Walk 10 feet on uneven surfaces activity   Assist level: Contact Guard/Touching assist Assistive device:  Walker-rolling  Stairs   Assist level: Minimal Assistance - Patient > 75% Stairs assistive device: 2 hand rails Max number of stairs: 4  Walk up/down 1 step activity   Walk up/down 1 step (curb) assist level: Contact Guard/Touching assist Walk up/down 1 step or curb assistive device: 2 hand rails    Walk up/down 4 steps activity   Walk up/down 4 steps assistive device: 2 hand rails  Walk up/down 12 steps activity Walk up/down 12 steps activity did not occur: Safety/medical concerns (due to patient fatigue)      Pick up small objects from floor Pick up small object from the floor (from standing position) activity did not occur: Safety/medical concerns (due to patient fatigue)      Wheelchair Will patient use wheelchair at discharge?: No          Wheel 50 feet with 2 turns activity      Wheel 150 feet activity       Patient received sitting up in wc, agreeable to PT. She denies pain. Patient able to ambulate short community distances, ~334f, on eval with RW and CGA. Slow gait speed maintained with consistent verbal cues to remain within walker frame and increase L LE foot clearance. With prolonged weight bearing, patient does note R foot pain related to foot fx from last year. Patient scored a 16/56 on the Berg balance  scale indicating high risk for falling and benefit from use of AD. Without support from walker or PT, patient with significant anterior lean and no attempt at regaining balance with ankle, hip or stepping strategy requiring intervention from PT to prevent fall. Patient returning to bed at end of session, bed alarm on, call light within reach.   Refer to Care Plan for Long Term Goals  SHORT TERM GOAL WEEK 1 PT Short Term Goal 1 (Week 1): STG= LTG based on ELOS  Recommendations for other services: Neuropsych and [employer], [employer] and Stress management  Skilled Therapeutic Intervention Mobility Bed Mobility Bed Mobility: Rolling Right;Rolling Left;Supine to Sit;Sit to Supine Rolling Right: Supervision/verbal cueing Rolling Left: Supervision/Verbal cueing Supine to Sit: Supervision/Verbal cueing Sit to Supine: Supervision/Verbal cueing Transfers Transfers: Sit to Stand;Stand to Sit;Stand Pivot Transfers Sit to Stand: Supervision/Verbal cueing Stand to Sit: Supervision/Verbal cueing Stand Pivot Transfers: Supervision/Verbal cueing Stand Pivot Transfer Details: Verbal cues for safe use of DME/AE;Verbal cues for precautions/safety Transfer (Assistive device): Rolling walker Locomotion  Gait Ambulation: Yes Gait Assistance: Contact Guard/Touching assist Gait Distance (Feet): 150 Feet Assistive device: Rolling walker Gait Gait: Yes Gait Pattern: Impaired Gait Pattern: Step-through pattern;Decreased step length - left;Decreased hip/knee flexion - left;Decreased dorsiflexion - right;Right foot flat;Trunk flexed;Poor foot clearance - left Gait velocity: decresaed Stairs / Additional Locomotion Stairs: Yes Stairs Assistance: Minimal Assistance - Patient > 75% Stair Management Technique: Two rails Number of Stairs: 4 Height of Stairs: 6 Ramp: Contact Guard/touching assist Curb: Contact Guard/Touching assist Wheelchair Mobility Wheelchair Mobility: No   Discharge  Criteria: Patient will be discharged from PT if patient refuses treatment 3 consecutive times without medical reason, if treatment goals not met, if there is a change in medical status, if patient makes no progress towards goals or if patient is discharged from hospital.  The above assessment, treatment plan, treatment alternatives and goals were discussed and mutually agreed upon: by patient  JDebbora Dus12/05/2020, 7:37 AM

## 2020-09-07 NOTE — Progress Notes (Signed)
Patient Details  Name: Natalie [name] MRN: [id number] Date of Birth: Aug 04, 1951  Today's Date: 09/07/2020  Hospital Problems: Principal Problem:   R corona radiata ICH (intracerebral hemorrhage) ([place]) w/ IVH d/t HTN  Past Medical History:  Past Medical History:  Diagnosis Date  . Arthritis   . Atrophy of left kidney   . Cervicalgia   . Chronic kidney disease, stage 3 ([place])    acute aki 03-14-2018 in setting pyelonehritis-- resolved  . Chronic migraine   . Chronic pain syndrome   . History of kidney stones   . History of pyelonephritis    hx recurrent  . History of recurrent UTIs   . History of sepsis    07/ 2017 secondary to pyelonephritis  . Hypertension   . Hypothyroidism   . Iron deficiency anemia   . Left ureteral stone   . Mixed hyperlipidemia   . Nephrolithiasis    right side nonobstructive per CT 03-14-2018  . Right shoulder pain    post sx 02-18-2018  . Seizure disorder ([place]) followed by pcp   per pt dx age 30--  no seizure since 1980's , controlled w/ phenobarbitol  . Type 2 diabetes mellitus ([place])    followed by pcp  . Wears glasses    Past Surgical History:  Past Surgical History:  Procedure Laterality Date  . CESAREAN SECTION  1974  . CHOLECYSTECTOMY OPEN  2002  . CYSTO/  LEFT RETROGRADE PYELOGRAM  06-11-2006   dr Jeffie Pollock  [facility]  . CYSTOSCOPY WITH RETROGRADE PYELOGRAM, URETEROSCOPY AND STENT PLACEMENT Left 04/10/2018   Procedure: CYSTOSCOPY WITH RETROGRADE PYELOGRAM, URETEROSCOPY AND STENT PLACEMENT;  Surgeon: Franchot Gallo, MD;  Location: [facility];  Service: Urology;  Laterality: Left;  . FOOT ARTHRODESIS Right 09/10/2019   Procedure: ARTHRODESIS FOOT;  Surgeon: Erle Crocker, MD;  Location: [place];  Service: Orthopedics;  Laterality: Right;  . IR URETERAL STENT PLACEMENT EXISTING ACCESS RIGHT  08-21-2007    dr Karsten Ro  [facility]  . LEFT RETROGRADE PYELOGRAM/ LEFT URETERAL STENT PLACEMENT  02-25-2001   dr Jeffie Pollock  [facility]  . NEPHROLITHOTOMY Right 05/14/2016   Procedure: NEPHROLITHOTOMY PERCUTANEOUS;  Surgeon: Franchot Gallo, MD;  Location: [facility];  Service: Urology;  Laterality: Right;  . OPEN REDUCTION INTERNAL FIXATION (ORIF) FOOT LISFRANC FRACTURE Right 09/10/2019   Procedure: OPEN REDUCTION INTERNAL FIXATION (ORIF) FOOT LISFRANC FRACTURE;  Surgeon: Erle Crocker, MD;  Location: [place];  Service: Orthopedics;  Laterality: Right;  . REPAIR EXTENSOR TENDON WITH METATARSAL OSTEOTOMY AND OPEN REDUCTION IN Right 09/10/2019   Procedure: OPEN TREATMENT RIGHT FIRST, SECOND, THIRD TARSOMETATARSAL LISFRANC, MIDFOOT FUSION;  Surgeon: Erle Crocker, MD;  Location: [place];  Service: Orthopedics;  Laterality: Right;  . SHOULDER ARTHROSCOPY Bilateral left 11-13-2000;  right 02-18-2018   Social History:  reports that she has never smoked. She has never used smokeless tobacco. She reports that she does not drink alcohol and does not use drugs.  Family / Support Systems Spouse/Significant Other: [name] Children: Shelby Anticipated Caregiver: daughter and spouse Ability/Limitations of Caregiver: spouse works out of town Building control surveyor Availability: 24/7  Social History Preferred language: English Religion: Christian Read: Yes Write: Yes Employment Status: Disabled   Abuse/Neglect Abuse/Neglect Assessment Can Be Completed: Yes Physical Abuse: Denies Verbal Abuse: Denies Sexual Abuse: Denies Exploitation of patient/patient's resources: Denies Self-Neglect: Denies  Emotional Status Recent Psychosocial Issues: no Psychiatric History: no Substance Abuse History: no  Patient / Family Perceptions, Expectations &  Goals Premorbid pt/family roles/activities: Mod I/Independent overall Anticipated changes in roles/activities/participation: Mod I Pt/family expectations/goals: Goal to d/c home MOD I with assistance from daughter and husband  [employer]: None Premorbid Home Care/DME Agencies: Other (Comment) (CBG meter, BP cuff,RW, Rollater,Single [employer], grab bars) Transportation available at discharge: family able to transport Resource referrals recommended: Neuropsychology ([place])  Discharge Planning Living Arrangements: Spouse/significant other, Children Support Systems: Spouse/significant other, Children Type of Residence: Private residence (1 level home, 4 steps to enter) Administrator, sports: Chartered certified accountant Resources: Fish farm manager, [employer] Financial Screen Referred: No Living Expenses: Education officer, community Management: Patient, Spouse Does the patient have any problems obtaining your medications?: No Home Management: Independent Care Coordinator Anticipated Follow Up Needs: HH/OP Expected length of stay: 7-10 Days  Clinical Impression sw met patient introduced self, explained role and process, awaiting return phone call from family member.   Dyanne Iha 09/07/2020, 1:05 PM

## 2020-09-07 NOTE — Progress Notes (Signed)
Inpatient Rehabilitation  Patient information reviewed and entered into eRehab system by Harrel Ferrone M. Tonie Elsey, M.A., CCC/SLP, PPS Coordinator.  Information including medical coding, functional ability and quality indicators will be reviewed and updated through discharge.    

## 2020-09-07 NOTE — Progress Notes (Signed)
Patient ID: Natalie [name], female   DOB: [birth year], 69 y.o.   MRN: [id number]  SW made attempt to call family for team conference updates, no answer. Left VM.   [place], [place]

## 2020-09-08 ENCOUNTER — Inpatient Hospital Stay (HOSPITAL_COMMUNITY): Payer: Medicare Other | Admitting: Physical Therapy

## 2020-09-08 ENCOUNTER — Inpatient Hospital Stay (HOSPITAL_COMMUNITY): Payer: Medicare Other | Admitting: Occupational Therapy

## 2020-09-08 DIAGNOSIS — I610 Nontraumatic intracerebral hemorrhage in hemisphere, subcortical: Secondary | ICD-10-CM | POA: Diagnosis not present

## 2020-09-08 LAB — GLUCOSE, CAPILLARY
Glucose-Capillary: 173 mg/dL — ABNORMAL HIGH (ref 70–99)
Glucose-Capillary: 308 mg/dL — ABNORMAL HIGH (ref 70–99)
Glucose-Capillary: 134 mg/dL — ABNORMAL HIGH (ref 70–99)
Glucose-Capillary: 231 mg/dL — ABNORMAL HIGH (ref 70–99)

## 2020-09-08 MED ORDER — BLOOD PRESSURE CONTROL BOOK
Freq: Once | Status: AC
  Filled 2020-09-08: qty 1

## 2020-09-08 MED ORDER — LIVING WELL WITH DIABETES BOOK
Freq: Once | Status: AC
  Filled 2020-09-08: qty 1

## 2020-09-08 MED ORDER — INSULIN NPH (HUMAN) (ISOPHANE) 100 UNIT/ML ~~LOC~~ SUSP: 7.0000 [IU] | Freq: Two times a day (BID) | SUBCUTANEOUS | Status: DC

## 2020-09-08 MED ORDER — INSULIN NPH (HUMAN) (ISOPHANE) 100 UNIT/ML ~~LOC~~ SUSP
7.0000 [IU] | Freq: Two times a day (BID) | SUBCUTANEOUS | Status: DC
  Administered 2020-09-08 – 2020-09-11 (×7): 7 [IU] via SUBCUTANEOUS

## 2020-09-08 NOTE — Plan of Care (Signed)
  Problem: Consults Goal: RH GENERAL PATIENT EDUCATION Description: See Patient Education module for education specifics. Outcome: Progressing Goal: Skin Care Protocol Initiated - if Braden Score 18 or less Description: If consults are not indicated, leave blank or document N/A Outcome: Progressing Goal: Diabetes Guidelines if Diabetic/Glucose > 140 Description: If diabetic or lab glucose is > 140 mg/dl - Initiate Diabetes/Hyperglycemia Guidelines & Document Interventions  Outcome: Progressing   Problem: RH BOWEL ELIMINATION Goal: RH STG MANAGE BOWEL WITH ASSISTANCE Description: STG Manage Bowel with  min Assistance. Outcome: Progressing Goal: RH STG MANAGE BOWEL W/MEDICATION W/ASSISTANCE Description: STG Manage Bowel with Medication with [place]. Outcome: Progressing   Problem: RH BLADDER ELIMINATION Goal: RH STG MANAGE BLADDER WITH ASSISTANCE Description: STG Manage Bladder With min  Assistance Outcome: Progressing   Problem: RH SKIN INTEGRITY Goal: RH STG MAINTAIN SKIN INTEGRITY WITH ASSISTANCE Description: STG Maintain Skin Integrity With  min Assistance. Outcome: Progressing   Problem: RH SAFETY Goal: RH STG ADHERE TO SAFETY PRECAUTIONS W/ASSISTANCE/DEVICE Description: STG Adhere to Safety Precautions With min  Assistance/Device. Outcome: Progressing   Problem: RH PAIN MANAGEMENT Goal: RH STG PAIN MANAGED AT OR BELOW PT'S PAIN GOAL Description: Pain level less than 4 on scale of 0-10. Outcome: Progressing

## 2020-09-08 NOTE — Progress Notes (Signed)
Patient ID: Natalie [name], female   DOB: 01-24-1951, 69 y.o.   MRN: [id number] Met with the patient to review role of the nurse CM and collaboration with the SW to facilitate preparation for discharge. Reviewed secondary stroke risks including DM and HTN along with addition of lipitor post event. Patient noted she did take medication for DM prior to admission and MD has recently increased dose of Novolin N to 25 units/ twice a day as her A1c was 9.0 and now 8.1. Reviewed carbohydrate counting and dietary needs for a diabetic as the patient reported she frequently skipped  Breakfast meals due to not being hungry. Discussed CMM diet and food choices . Patient given handouts on carbohydrate counting, cooking with less salt, lipitor and notebooks on diabetes management and HTN management. No other questions or concerns noted at present, continue to follow along to discharge to address educational needs. Margarito Liner

## 2020-09-08 NOTE — Progress Notes (Signed)
[place] PHYSICAL MEDICINE & REHABILITATION PROGRESS NOTE   Subjective/Complaints:  No issues overnite, tolerating therapy well , wants to go home this weekend to attend Christmas party  ROS- denies CP, SOB, N/V/D  Objective:   DG Wrist Complete Left  Result Date: 09/06/2020 CLINICAL DATA:  Pain status post fall EXAM: LEFT WRIST - COMPLETE 3+ VIEW COMPARISON:  None. FINDINGS: There are degenerative changes of the wrist and first [place]. There is a probable old healed fracture of the distal radius. There is an old fracture of the ulnar styloid process. There is no acute displaced fracture or dislocation on today's study. IMPRESSION: Negative. Electronically Signed   By: Constance Holster M.D.   On: 09/06/2020 19:38   Recent Labs    09/06/20 0401 09/07/20 0504  WBC 5.9 5.8  HGB 10.4* 10.2*  HCT 33.4* 31.3*  PLT 224 205   Recent Labs    09/06/20 0401 09/07/20 0504  NA 141 140  K 4.1 3.6  CL 106 105  CO2 24 25  GLUCOSE 145* 156*  BUN 25* 25*  CREATININE 1.31* 1.21*  CALCIUM 9.6 9.5    Intake/Output Summary (Last 24 hours) at 09/08/2020 9937 Last data filed at 09/07/2020 1300 Gross per 24 hour  Intake 360 ml  Output --  Net 360 ml        Physical Exam: Vital Signs Blood pressure (!) 151/71, pulse 70, temperature 97.6 F (36.4 C), resp. rate 17, height 4' 7.98" (1.422 m), weight 78 kg, SpO2 97 %.   General: No acute distress Mood and affect are appropriate Heart: Regular rate and rhythm no rubs murmurs or extra sounds Lungs: Clear to auscultation, breathing unlabored, no rales or wheezes Abdomen: Positive bowel sounds, soft nontender to palpation, nondistended Extremities: No clubbing, cyanosis, or edema Skin: No evidence of breakdown, no evidence of rash Neurologic: Cranial nerves II through XII intact, motor strength is 5/5 in bilateral deltoid, bicep, tricep, grip, hip flexor, knee extensors, ankle dorsiflexor and plantar flexor  Cerebellar exam normal  finger to nose to finger as well as heel to shin in bilateral upper and lower extremities Musculoskeletal: Full range of motion in all 4 extremities. No joint swelling   Assessment/Plan: 1. Functional deficits which require 3+ hours per day of interdisciplinary therapy in a comprehensive inpatient rehab setting.  Physiatrist is providing close team supervision and 24 hour management of active medical problems listed below.  Physiatrist and rehab team continue to assess barriers to discharge/monitor patient progress toward functional and medical goals  Care Tool:  Bathing    Body parts bathed by patient: Right arm,Face,Left arm,Abdomen,Chest,Front perineal area,Buttocks,Right upper leg,Left upper leg,Right lower leg,Left lower leg         Bathing assist Assist Level: Supervision/Verbal cueing     Upper Body Dressing/Undressing Upper body dressing   What is the patient wearing?: Bra,Pull over shirt    Upper body assist Assist Level: Supervision/Verbal cueing    Lower Body Dressing/Undressing Lower body dressing      What is the patient wearing?: Underwear/pull up     Lower body assist Assist for lower body dressing: Minimal Assistance - Patient > 75%     Toileting Toileting    Toileting assist Assist for toileting: Minimal Assistance - Patient > 75%     Transfers Chair/bed transfer  Transfers assist     Chair/bed transfer assist level: Contact Guard/Touching assist     Locomotion Ambulation   Ambulation assist      Assist level:  Contact Guard/Touching assist Assistive device: Walker-rolling Max distance: 300   Walk 10 feet activity   Assist     Assist level: Contact Guard/Touching assist Assistive device: Walker-rolling   Walk 50 feet activity   Assist    Assist level: Contact Guard/Touching assist Assistive device: Walker-rolling    Walk 150 feet activity   Assist    Assist level: Contact Guard/Touching assist Assistive device:  Walker-rolling    Walk 10 feet on uneven surface  activity   Assist     Assist level: Contact Guard/Touching assist Assistive device: Aeronautical engineer Will patient use wheelchair at discharge?: No             Wheelchair 50 feet with 2 turns activity    Assist            Wheelchair 150 feet activity     Assist          Blood pressure (!) 151/71, pulse 70, temperature 97.6 F (36.4 C), resp. rate 17, height 4' 7.98" (1.422 m), weight 78 kg, SpO2 97 %.  Medical Problem List and Plan: 1.  R corona radiata IPH secondary to HTN- that's uncontrolled- with L hemiparesis             -patient may  shower             -ELOS/Goals: 10-14 days- mod I to supervision   2.  Antithrombotics: -DVT/anticoagulation:  Mechanical: Sequential compression devices, below knee Bilateral lower extremities             -antiplatelet therapy: N/a 3. Chronic pain due to migraines/Pain Management: Stadol or Tylenol prn.  4. Mood: LCSW to follow for evaluation and support.              -antipsychotic agents: N/A 5. Neuropsych: This patient is capable of making decisions on her own behalf. 6. Skin/Wound Care: Routine pressure relief measures.  7. Fluids/Electrolytes/Nutrition: Monitor I/O. Check lytes in am.  8. HTN: Monitor BP tid--continue Norvasc daily.  Vitals:   09/07/20 1955 09/08/20 0530  BP: (!) 150/71 (!) 151/71  Pulse: 81 70  Resp: 16 17  Temp: (!) 97.5 F (36.4 C) 97.6 F (36.4 C)  SpO2: 97% 97%  elevated sys BP will monitor for 1-2 d prior to amlodipine dose adjustment  9. T2DM with peripheral neuropathy: Hgb A1C-8.1. Will monitor BS ac/hs. Continue glucotrol and NPH insulin.  CBG (last 3)  Recent Labs    09/07/20 1627 09/07/20 2114 09/08/20 0549  GLUCAP 236* 233* 173*   Increase novulin N to 7U BID  10. Seizure disorder: Managed on home dose Phenobarbital. No seizure for 30+ years on meds 11.  CKD: Baseline SCr- has been around  1.6 per records. Now down to 1.3---> will monitor with serial checks. Avoid nephrotoxic medications.  12. ABLA: Recheck CBC in am.  13. Left shoulder/wrist pain: Getting better --reports fall at admission. Shoulder X ray negative--, wrist xray shows healt Colles fx  Will add Voltaren gel.     LOS: 2 days A FACE TO FACE EVALUATION WAS PERFORMED  Charlett Blake 09/08/2020, 8:22 AM

## 2020-09-08 NOTE — Progress Notes (Addendum)
Occupational Therapy Session Note  Patient Details  Name: Natalie [name] MRN: [id number] Date of Birth: [birth year]  Today's Date: 09/08/2020 OT Individual Time: 0803-0901 OT Individual Time Calculation (min): 58 min    Short Term Goals: Week 1:  OT Short Term Goal 1 (Week 1): LTG = STG 2/2 short stay  Skilled Therapeutic Interventions/Progress Updates:    Session 1:  (8250-5397) Pt received semi-reclined in bed eating breakfast with bed alarm engaged. Completed sup>sit EOB with close supervision to finish eating breakfast at EOB. Maintains static sitting balance with close supervision and eats ind. Sit>stand with RW with CGA and min VCs to push up from bed. Amb to chair to gather clothing with RW + CGA. Seated in chair, doffed hospital gown with close supervision. Donned bra, shirt, underwear with close supervision. Req min A to correct sliding forward in chair when donning pants 2/2 to L foot sliding forward. Donned socks/shoes with close supervision. Transported to gym in w/c 2/2 time management. Stand-pivot from w/c to mat with RW + CGA.   Completed 9 Hole Peg Test to get baseline for BUE coordination: RUE : 34.66 secs, LUE: 43.57 seconds, indicating impaired LUE coordination compared to RUE. Completed 3 sets of L hand to finger translation to improve FMC, picking up pegs and placing in holes. Req mod VCs to translate individual peg to finger and 1 demonstration. Returned to room in w/c 2/2 time. Pt left in w/c with chair alarm engaged, call bell in reach, all immediate needs met .    Session 2:  ( 1500-1531)Pt received seated in w/c with chair alarm engaged, requesting to shower. Sit>stand with CGA. Amb to chair to collect clothes with CGA. Req min A to correct LOB x1 when reaching over to collect clothes in chair. Amb to bathroom with RW, req min A to correct LOBx1 after lifting RW to enter bathroom. Stand > sit on TTB with CGA. Bathed full-body with close supervision. Completed sit<>stand to  wash buttocks with CGA + use of L grab bar. Amb to w/c to get dressed with RW + CGA. Seated, completed UB/LB dressing with close supervision. Donned bra, shirt, underwear, pants with close supervision. Req min A to don L shoe to place over toes. Transported to gym in w/c 2/2 time. To promote LUE NMR and BUE coordination, completed 2x10 of shoulder height elbow flexion/extension, forward/backward rolls, and holding in shoulder flexion for ~1 min with 2lb dowel bar. To improve dynamic standing balance and functional reach in prep for standing ADLs, stood with RW to place and remove velcro cards from above head height with CGA using the LUE. Without RW, retrieved velco cards from knee height and placed at above head height with CGA. Standing on foam mat, req intermittent min A to place cards at knee height 2/2 posterior weight shift. Transported back to room in w/c 2/2 time. Pt left in w/c with chair alarm engaged, call bell in reach, all immediate needs met.     Therapy Documentation Precautions:  Precautions Precautions: Fall Restrictions Weight Bearing Restrictions: No Other Position/Activity Restrictions: SBP <140 Pain: no c/o pain ADL: See Care Tool for more details.   Therapy/Group: Individual Therapy  Gretta Began, OTR/L Clyda Greener OTR/L   09/08/2020, 3:16 PM

## 2020-09-08 NOTE — Progress Notes (Signed)
Physical Therapy Session Note  Patient Details  Name: Natalie [name] MRN: [id number] Date of Birth: 05/17/1951  Today's Date: 09/08/2020 PT Individual Time: 1100-1200 PT Individual Time Calculation (min): 60 min   Short Term Goals: Week 1:  PT Short Term Goal 1 (Week 1): STG= LTG based on ELOS  Skilled Therapeutic Interventions/Progress Updates:   Pt received sitting in WC and agreeable to PT. Pt transported to rehab gym in [facility].   Gait training performed with RW x 76f and supervision assist. Cues for posture and safety with AD management in turns.  Gait training  without AD through rehab gym with min assist from PT for safety, mild circumduction on the LLE, as well poor clearance, but able to correct with cues.   Dynamic gait training with RW to step over cane on floor and weave through 2 cones x 2 with supervision assist. Performed without AD to navigate around stick and cones x 772fwith min-CGA. Gait training perform in [facility] 4 x 3045fith visual feed back of line on floor to improve coordination and heel contact. Fall recovery training in maxisky posteriorly. Pt unable perform stepping strategy to correct posterior LOB requiring mod assist from PT to regain balance    Stair management training to ascend/descend 4 steps( 6") x 2 with min-CGA from PT with min cues for UE placement and proper step to gait pattern.  Step management training in maxisky without AD and leading with the LLE to force use of LLE and improve weight shift.   Patient returned to room and left [facility] in reach and all needs met.          Therapy Documentation Precautions:  Precautions Precautions: Fall Restrictions Weight Bearing Restrictions: No Other Position/Activity Restrictions: SBP <140   Pain: Pain Assessment Pain Scale: 0-10 Pain Score: 5  Pain Type: Acute pain Pain Location: Shoulder Pain Orientation: Left Pain Descriptors / Indicators: Constant;Aching Pain  Frequency: Intermittent Pain Onset: With Activity Patients Stated Pain Goal: 4 Pain Intervention(s): Medication (See eMAR) (Tylenol, Voltaren) Multiple Pain Sites: No    Therapy/Group: Individual Therapy  AusLorie Phenix/06/2020, 1:12 PM

## 2020-09-09 ENCOUNTER — Inpatient Hospital Stay (HOSPITAL_COMMUNITY): Payer: Medicare Other | Admitting: Physical Therapy

## 2020-09-09 ENCOUNTER — Inpatient Hospital Stay (HOSPITAL_COMMUNITY): Payer: Medicare Other | Admitting: Occupational Therapy

## 2020-09-09 ENCOUNTER — Inpatient Hospital Stay (HOSPITAL_COMMUNITY): Payer: Medicare Other

## 2020-09-09 LAB — GLUCOSE, CAPILLARY
Glucose-Capillary: 151 mg/dL — ABNORMAL HIGH (ref 70–99)
Glucose-Capillary: 227 mg/dL — ABNORMAL HIGH (ref 70–99)
Glucose-Capillary: 139 mg/dL — ABNORMAL HIGH (ref 70–99)
Glucose-Capillary: 353 mg/dL — ABNORMAL HIGH (ref 70–99)

## 2020-09-09 NOTE — Progress Notes (Signed)
Occupational Therapy Session Note  Patient Details  Name: Natalie [name] MRN: [id number] Date of Birth: Nov 06, 1950  Today's Date: 09/09/2020 OT Individual Time: 8413-2440 OT Individual Time Calculation (min): 76 min    Short Term Goals: Week 1:  OT Short Term Goal 1 (Week 1): LTG = STG 2/2 short stay  Skilled Therapeutic Interventions/Progress Updates:    Pt up in the wheelchair to begin session.  She was rolled down to the dayroom in the wheelchair in order to work on standing balance and LUE coordination and strengthening.  Had pt work on standing balance while working on [employer] activity.  Had her stand initially with use of the RW to toss and to retrieve the bean bags.  Min instructional cueing was needed for staying inside of the RW when retrieving items from lower surfaces. Progressed to having her stand with only one UE support and no UE support while having to reach to knee level to pick up the bean bags with the LUE.  Min assist needed for balance when working on picking up the bean bags from the board and the floor without an assistive device.  After 3 intervals of [place], had her work on Nurse, learning disability Four game to further work on standing balance without assistive device.  Had her reach to the floor to pick up large checkers with mod demonstrational cueing and min assist to complete more knee flexion instead of trunk flexion.  Pt with some report of increased back pain after several repetitions of this.  Next, had her transition to sitting on the therapy mat to work on LUE FM coordination and reach.  Had her work on picking up and flipping playing cards, picking up and placing clothespins, and picking up and placing 1-2" wooden pegs in the peg board.  She was able to complete all tasks with the LUE at supervision level and place 31/33 pegs with focus on translation from finger tips to palms and back to pick them up and place them.  Last task focused on catching and tossing a  beach ball and basketball from seated position.  She was able to efficiently catch both and lift them back overhead to toss back to the therapist.  Had pt ambulate back to the room with min guard assist using the RW to finish session.  She was left up in the wheelchair with the call button and phone in reach and safety belt in place.       Therapy Documentation Precautions:  Precautions Precautions: Fall Restrictions Weight Bearing Restrictions: No Other Position/Activity Restrictions: SBP <140   Pain: Pain Assessment Pain Scale: Faces Faces Pain Scale: Hurts little more Pain Type: Chronic pain Pain Location: Back Pain Orientation: Lower Pain Descriptors / Indicators: Discomfort Pain Onset: With Activity Pain Intervention(s): Repositioned ADL: See Care Tool for some details of mobility and selfcare  Therapy/Group: Individual Therapy  Hydia Copelin OTR/L 09/09/2020, 12:23 PM

## 2020-09-09 NOTE — Progress Notes (Signed)
Occupational Therapy Session Note  Patient Details  Name: Natalie [name] MRN: [id number] Date of Birth: 04-10-1951  Today's Date: 09/09/2020 OT Individual Time: 1595-3967 OT Individual Time Calculation (min): 31 min    Short Term Goals: Week 1:  OT Short Term Goal 1 (Week 1): LTG = STG 2/2 short stay  Skilled Therapeutic Interventions/Progress Updates:    Pt received in bathroom with NT present, husband present. Pt successfully voided, CGA for toilet hygiene, clothing management, and transfer with RW. Amb to sink with RW with CGA. Washed hands at sink with CGA. Transported to gym in w/c 2/2 time. Stand-pivot t/f with CGA to mat. Completed 3x10 of shoulder height elbow flexion/extension, frontward/backward rows, shoulder flexion hold for 1 min, and bicep curls with 3 lb dowel bar to target LUE NMR and functional strength. Picked up and placed ~20 push pins to place on design with LUE to improve LUE FMC. Stand-pivot to w/c with CGA. Back to room in w/c 2/2 time. Left in w/c with safety belt alarm engaged, call bell in reach, husband and dietary services present. Reported that her back pain had resolved since this morning.  Therapy Documentation Precautions:  Precautions Precautions: Fall Restrictions Weight Bearing Restrictions: No Other Position/Activity Restrictions: SBP <140 Pain: no c/o pain ADL: See Care Tool for more details.   Therapy/Group: Individual Therapy    Volanda Napoleon MS, OTR/L 09/09/2020, 1:38 PM

## 2020-09-09 NOTE — Progress Notes (Addendum)
[facility] PHYSICAL MEDICINE & REHABILITATION PROGRESS NOTE   Subjective/Complaints:  Discussed diabetic management, home dose of Novulin as well as glipizide  is higher than current doses  ROS- denies CP, SOB, N/V/D  Objective:   No results found. Recent Labs    09/07/20 0504  WBC 5.8  HGB 10.2*  HCT 31.3*  PLT 205   Recent Labs    09/07/20 0504  NA 140  K 3.6  CL 105  CO2 25  GLUCOSE 156*  BUN 25*  CREATININE 1.21*  CALCIUM 9.5    Intake/Output Summary (Last 24 hours) at 09/09/2020 0807 Last data filed at 09/08/2020 1730 Gross per 24 hour  Intake 474 ml  Output --  Net 474 ml        Physical Exam: Vital Signs Blood pressure 136/66, pulse 84, temperature 98 F (36.7 C), temperature source Oral, resp. rate 16, height 4' 7.98" (1.422 m), weight 78 kg, SpO2 95 %.   General: No acute distress Mood and affect are appropriate Heart: Regular rate and rhythm no rubs murmurs or extra sounds Lungs: Clear to auscultation, breathing unlabored, no rales or wheezes Abdomen: Positive bowel sounds, soft nontender to palpation, nondistended Extremities: No clubbing, cyanosis, or edema Skin: No evidence of breakdown, no evidence of rash Motor strength 5/5 in B Delt, bi, tri, grip, HF , KE ADF Cerebellar exam normal finger to nose to finger as well as heel to shin in bilateral upper and lower extremities Musculoskeletal: Full range of motion in all 4 extremities. No joint swelling   Assessment/Plan: 1. Functional deficits which require 3+ hours per day of interdisciplinary therapy in a comprehensive inpatient rehab setting.  Physiatrist is providing close team supervision and 24 hour management of active medical problems listed below.  Physiatrist and rehab team continue to assess barriers to discharge/monitor patient progress toward functional and medical goals  Care Tool:  Bathing    Body parts bathed by patient: Right arm,Face,Left arm,Abdomen,Chest,Front  perineal area,Buttocks,Right upper leg,Left upper leg,Right lower leg,Left lower leg         Bathing assist Assist Level: Contact Guard/Touching assist     Upper Body Dressing/Undressing Upper body dressing   What is the patient wearing?: Bra,Pull over shirt    Upper body assist Assist Level: Supervision/Verbal cueing    Lower Body Dressing/Undressing Lower body dressing      What is the patient wearing?: Underwear/pull up     Lower body assist Assist for lower body dressing: Contact Guard/Touching assist     Toileting Toileting    Toileting assist Assist for toileting: Contact Guard/Touching assist     Transfers Chair/bed transfer  Transfers assist     Chair/bed transfer assist level: Contact Guard/Touching assist     Locomotion Ambulation   Ambulation assist      Assist level: Minimal Assistance - Patient > 75% Assistive device: Hand held assist Max distance: 10'   Walk 10 feet activity   Assist     Assist level: Contact Guard/Touching assist Assistive device: Walker-rolling   Walk 50 feet activity   Assist    Assist level: Contact Guard/Touching assist Assistive device: Walker-rolling    Walk 150 feet activity   Assist    Assist level: Contact Guard/Touching assist Assistive device: Walker-rolling    Walk 10 feet on uneven surface  activity   Assist     Assist level: Contact Guard/Touching assist Assistive device: Walker-rolling   Wheelchair     Assist Will patient use wheelchair at discharge?:  No             Wheelchair 50 feet with 2 turns activity    Assist            Wheelchair 150 feet activity     Assist          Blood pressure 136/66, pulse 84, temperature 98 F (36.7 C), temperature source Oral, resp. rate 16, height 4' 7.98" (1.422 m), weight 78 kg, SpO2 95 %.  Medical Problem List and Plan: 1.  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  Antithrombotics: -DVT/anticoagulation:  Mechanical: Sequential compression devices, below knee Bilateral lower extremities             -antiplatelet therapy: N/a 3. Chronic pain due to migraines/Pain Management: Stadol or Tylenol prn.  4. Mood: LCSW to follow for evaluation and support.              -antipsychotic agents: N/A 5. Neuropsych: This patient is capable of making decisions on her own behalf. 6. Skin/Wound Care: Routine pressure relief measures.  7. Fluids/Electrolytes/Nutrition: Monitor I/O. Check lytes in am.  8. HTN: Monitor BP tid--continue Norvasc daily.  Vitals:   09/08/20 1939 09/09/20 0511  BP: (!) 153/72 136/66  Pulse: 72 84  Resp: 18 16  Temp: 97.8 F (36.6 C) 98 F (36.7 C)  SpO2: 99% 95%  improved this am cont amlodipine 5mg   9. T2DM with peripheral neuropathy: Hgb A1C-8.1. Will monitor BS ac/hs.( Home dose glucotrol 10mg  BID  and NPH insulin 25U BID.)  CBG (last 3)  Recent Labs    09/08/20 1611 09/08/20 2113 09/09/20 0600  GLUCAP 134* 231* 151*  cont glipizide 10mg  qam  Increase novulin N to 7U BID  On 12/9 , increased to 9 u 12/10 10. Seizure disorder: Managed on home dose Phenobarbital. No seizure for 30+ years on meds 11.  CKD: Baseline SCr- has been around 1.6 per records. Now down to 1.3---> will monitor with serial checks. Avoid nephrotoxic medications.  12. ABLA: Recheck CBC in am.  13. Left shoulder/wrist pain: Getting better --reports fall at admission. Shoulder X ray negative--, wrist xray shows healt Colles fx  Will add Voltaren gel.     LOS: 3 days A FACE TO FACE EVALUATION WAS PERFORMED  Charlett Blake 09/09/2020, 8:07 AM

## 2020-09-09 NOTE — IPOC Note (Signed)
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     Functional Problem List: Nursing Bladder,Bowel,Endurance,Medication Management,Pain,Safety  PT Balance,Endurance,Motor,Nutrition,Pain,Perception,Safety,Sensory  OT Balance,Sensory,Endurance,Motor  SLP    TR         Basic ADL's: OT Grooming,Bathing,Dressing,Toileting     Advanced  ADL's: OT Simple Meal Preparation,Full Meal Preparation,Laundry,Light Housekeeping     Transfers: PT Bed Mobility,Bed to [employer]  OT Tub/Shower,Toilet     Locomotion: PT Ambulation,Stairs     Additional Impairments: OT None  SLP        TR      Anticipated Outcomes Item Anticipated Outcome  Self Feeding ind  Swallowing      Basic self-care  mod I  Toileting  mod I   Bathroom Transfers supervision  Bowel/Bladder  Patient will remain ci=ontinent of bowel and bladder  Transfers  SPV  Locomotion  SPV  Communication     Cognition     Pain  pain <2 out 10  Safety/Judgment  Disease Management, Medication management, bladder management   Therapy Plan: PT Intensity: Minimum of 1-2 x/day ,45 to 90 minutes PT Frequency: 5 out of 7 days PT Duration Estimated Length of Stay: 7-10 days OT Intensity: Minimum of 1-2 x/day, 45 to 90 minutes OT Frequency: 5 out of 7 days OT Duration/Estimated Length of Stay: 7-9 days SLP Duration/Estimated Length of Stay: N/A for ST (7-10 days for PT/OT)   Due to the current state of emergency, patients may not be receiving their 3-hours of Medicare-mandated therapy.   Team Interventions: Nursing Interventions Patient/Family Education,Disease Management/Prevention,Pain Management,Medication Management  PT interventions Ambulation/gait training,Discharge planning,Functional  mobility training,Psychosocial support,Therapeutic Activities,Visual/perceptual remediation/compensation,Balance/vestibular training,Disease management/prevention,Neuromuscular re-education,Skin care/wound management,Therapeutic Exercise,Wheelchair propulsion/positioning,Cognitive remediation/compensation,DME/adaptive equipment instruction,Pain management,Splinting/orthotics,UE/LE Strength taining/ROM,Community reintegration,Functional electrical stimulation,Patient/family education,Stair training,UE/LE Coordination activities  OT Interventions Balance/vestibular training,Disease mangement/prevention,Neuromuscular re-education,Self Care/advanced ADL retraining,Therapeutic Exercise,UE/LE Strength taining/ROM,Pain Education administrator instruction,Discharge planning,Functional mobility training,Psychosocial support,Therapeutic Activities,UE/LE Coordination activities,Patient/family education,Community reintegration  SLP Interventions    TR Interventions    SW/CM Interventions Discharge Planning,Psychosocial Support,Patient/Family Education   Barriers to Discharge MD  Medical stability and Home enviroment access/loayout  Nursing Home environment access/layout    PT Lack of/limited family support    OT      SLP      SW       Team Discharge Planning: Destination: PT-Home ,OT- Home , SLP-Home Projected Follow-up: PT-Home health PT,Outpatient PT, OT-  None, SLP-None Projected Equipment Needs: PT-Rolling walker with 5" wheels, OT- None recommended by OT (pt has TTB, bathroom grab bars, ramp, RW at home), SLP-None recommended by SLP Equipment Details: PT-pediatric RW, OT-  Patient/family involved in discharge planning: PT- Patient,  OT-Patient,Family member/caregiver, SLP-Patient  MD ELOS: 10-14d Medical Rehab Prognosis:  Good Assessment:  69 year old RH- emale with history of T2DM with neuropathy, CKD III, chronic pain due to cervicalgia/migraines, recurrent UTIs, seizure d/o- on  phenobarbital who was admitted on 09/02/20 with acute onset of left sided weakness with left facial droop and mild slurred speech.  CT head done showing 2 cc acute hematoma right corona radiata with early extension into right lateral ventricle.  MRI brain showed IPH without underlying lesion, few chronic microhemorrhages as well as mild to moderate chronic ischemic changes.  CTA head/neck was negative for LVO, aneurysm or occlusion.  Bleed felt to be hypertensive in nature  with SBP goals <140.   Follow-up CT head showed stable bleed.  Therapy evaluations completed and patient was noted to have balance deficits affecting  mobility and ADLs.  CIR was recommended due to functional decline.   See Team Conference Notes for weekly updates to the plan of care

## 2020-09-09 NOTE — Progress Notes (Signed)
Physical Therapy Session Note  Patient Details  Name: Natalie [name] MRN: [id number] Date of Birth: 04-Oct-1950  Today's Date: 09/09/2020 PT Individual Time: 0920-1015 PT Individual Time Calculation (min): 55 min   Short Term Goals: Week 1:  PT Short Term Goal 1 (Week 1): STG= LTG based on ELOS  Skilled Therapeutic Interventions/Progress Updates:    Pt received sitting up in w/c and agreeable to PT session. Pt assisted with upper body dressing with supervision and setup assist and lower body dressing for placing pants around ankles to knees with max assist. Pt performed sit>stand with CGA and PT provided CGA while pt donned pants to hips. Pt returned to sitting and PT donned shoes and socks min assist. Pt transported to gym total assist for time management and energy conservation.   Gait w/ numerous ADs (RW, Cane, Rollator, Musician): -RW/CANE: pt ambulated ~112f with RW and Cane. Pt requiring close supervision/CGA during turns with RW and min assist with cane to maintain balance while providing verbal cues for sequencing. Prior to cane use, PT demonstrated 3 point step to pattern with LLE and cane moving together. When ambulating with cane, pt assumed pattern of cane, R foot, L foot, however, PT corrected multiple times and pt continued to struggle with sequencing despite multiple cues. Pt tends to place can too far out in front and needs constant reminders to keep cane close to her at this time if used.  -ROLLATOR: pt ambulated with rollator ~2057fwith CGA and min assist with 2 near LOB to L due lack of L foot clearance and pt pushing rollator too far in front of her. Pt expresses wanting to trial rollator on carpet to see how it would function in her home.  -RW/ROLLATOR IN APT: Pt ambulated ~15078fith rollator and CGA to apartment and navigated through bedroom, bathroom, and kitchen while being instructed on how to negotiate her refridgerator and cabinets with her rollator. Pt taught how  to open fridge with rollator from a standing position and seated position on seat. One near LOB noted when turning as pt did not place hands on handles during turn despite instruction. Performed same as above with RW and supervision (except when performing reaching into fridge- CGA) in order to provide patient with greater stability and greater ease for reaching in and out of the refridgerator. Pt states that she feels much more stable with the walker and prefers to use it at this time.  -SHOPPING CART: pt ambulated with shopping cart and supervision for 200f87ft tends to keep shopping cart away from her and struggles to bring it with her when completing turns. Needs verbal cueing to stay near shopping cart in order to prevent anterior/L LOB.   **throughout all trials of gait, pt continues to have difficulty clearing L foot, therefore needs constant verbal cues to clear it when ambulating.   Pt transported to room total assist due to noted fatigue. Pt left in wc with chair alarm on, needs met, and call bell in reach. No pain reported this session.  Therapy Documentation Precautions:  Precautions Precautions: Fall Restrictions Weight Bearing Restrictions: No Other Position/Activity Restrictions: SBP <140  Therapy/Group: Individual Therapy  BailGaylord Shih10/2021, 7:10 AM

## 2020-09-09 NOTE — Progress Notes (Signed)
Physical Therapy Session Note  Patient Details  Name: Natalie [name] MRN: [id number] Date of Birth: 1950-11-11  Today's Date: 09/09/2020 PT Individual Time: 6010-9323 PT Individual Time Calculation (min): 57 min   Short Term Goals: Week 1:  PT Short Term Goal 1 (Week 1): STG= LTG based on ELOS  Skilled Therapeutic Interventions/Progress Updates:    Pt received sitting in w/c and reporting fatigue but agreeable to therapy session. Sit<>stands using RW with CGA/close supervision for safety during session. Gait training ~115ft to main therapy gym using RW with CGA for steadying - demos increased B UE WBing on RW resulting in pt pushing AD too far forward in addition to L LE not fully clearing during swing, scuffing the ground, but does not cause significant imbalance. Dynamic standing balance task, L HHA progressed to no UE support, via alternate B LE foot taps on 6" step with min assist for balance due to intermittent L posterior LOB during L stance. L lateral foot taps on/off 6" step with CGA for steadying then R lateral foot taps forcing L LE WBing with pt again having L lateral LOB requiring min assist.  Repeated sit<>stands, no UE support, with CGA for steadying x12reps. Dynamic gait training ~41ft x2 to/from hallway, no AD, with CGA for steadying on 1st walk and min assist for balance on 2nd due to fatigue and worsening L LE foot clearance causing increased anterior trunk lean. At hallway rail for safety but no UE support performed R/L lateral side stepping with CGA for steadying - demos slow stepping speed but good LE sequencing with min cuing. Dynamic standing balance and LUE NMR task with R foot (then later L foot) on airex (other foot on ground) while performing overhead ball toss into rebounder - CGA for steadying. Stair navigation training 8 steps using B UE support on HRs with min assist for balance - ascending with step-to progressed to reciprocal pattern targeting L LE NMR and descended with  step-to leading with L LE.  At stairs with B UE support on HRs progressed to only L UE support performed L LE NMR via stepping L foot up on 1st 6" step followed by stepping R LE onto 2nd step then back down to ground with R LE targeting L LE glute/quad activation. Gait training ~46ft x2 to/from stairs, no AD, again with CGA going to stairs and then min assist returning to mat due to increasing fatigue resulting in decreased L LE foot clearance and increased anterior lean. Gait training ~16ft back to room using RW with continued CGA - continued cuing for improved L LE foot clearance and decreased B UE WBing through RW for improved upright posture. Pt left seated in w/c with needs in reach and seat belt alarm on.  Therapy Documentation Precautions:  Precautions Precautions: Fall Restrictions Weight Bearing Restrictions: No Other Position/Activity Restrictions: SBP <140  Pain: No reports of pain throughout session.   Therapy/Group: Individual Therapy  Tawana Scale , PT, DPT, CSRS  09/09/2020, 1:29 PM

## 2020-09-10 ENCOUNTER — Inpatient Hospital Stay (HOSPITAL_COMMUNITY): Payer: Medicare Other | Admitting: Physical Therapy

## 2020-09-10 ENCOUNTER — Inpatient Hospital Stay (HOSPITAL_COMMUNITY): Payer: Medicare Other | Admitting: Occupational Therapy

## 2020-09-10 LAB — GLUCOSE, CAPILLARY
Glucose-Capillary: 143 mg/dL — ABNORMAL HIGH (ref 70–99)
Glucose-Capillary: 213 mg/dL — ABNORMAL HIGH (ref 70–99)
Glucose-Capillary: 146 mg/dL — ABNORMAL HIGH (ref 70–99)
Glucose-Capillary: 321 mg/dL — ABNORMAL HIGH (ref 70–99)

## 2020-09-10 NOTE — Progress Notes (Signed)
[place] PHYSICAL MEDICINE & REHABILITATION PROGRESS NOTE   Subjective/Complaints: CBG was elevated to 353 last night because she did not like her supper and ordered a late chicken sandwich She does not feel the voltaren gel helps and would like it to be discontinued Discussed with Gregary Signs OT, she has been doing well with therapy.  ROS- denies CP, SOB, N/V/D, +fatigue  Objective:   No results found. No results for input(s): WBC, HGB, HCT, PLT in the last 72 hours. No results for input(s): NA, K, CL, CO2, GLUCOSE, BUN, CREATININE, CALCIUM in the last 72 hours.  Intake/Output Summary (Last 24 hours) at 09/10/2020 1554 Last data filed at 09/10/2020 1300 Gross per 24 hour  Intake 597 ml  Output --  Net 597 ml        Physical Exam: Vital Signs Blood pressure 136/63, pulse 78, temperature 97.9 F (36.6 C), resp. rate 16, height 4' 7.98" (1.422 m), weight 78 kg, SpO2 100 %. Gen: no distress, normal appearing HEENT: oral mucosa pink and moist, NCAT Cardio: Reg rate Chest: normal effort, normal rate of breathing Abd: soft, non-distended Ext: no edema Skin: intact Motor strength 5/5 in B Delt, bi, tri, grip, HF , KE ADF Cerebellar exam normal finger to nose to finger as well as heel to shin in bilateral upper and lower extremities Musculoskeletal: Full range of motion in all 4 extremities. No joint swelling   Assessment/Plan: 1. Functional deficits which require 3+ hours per day of interdisciplinary therapy in a comprehensive inpatient rehab setting.  Physiatrist is providing close team supervision and 24 hour management of active medical problems listed below.  Physiatrist and rehab team continue to assess barriers to discharge/monitor patient progress toward functional and medical goals  Care Tool:  Bathing    Body parts bathed by patient: Right arm,Face,Left arm,Abdomen,Chest,Front perineal area,Buttocks,Right upper leg,Left upper leg,Right lower leg,Left lower leg          Bathing assist Assist Level: Supervision/Verbal cueing     Upper Body Dressing/Undressing Upper body dressing   What is the patient wearing?: Bra,Pull over shirt    Upper body assist Assist Level: Set up assist    Lower Body Dressing/Undressing Lower body dressing      What is the patient wearing?: Underwear/pull up,Pants     Lower body assist Assist for lower body dressing: Supervision/Verbal cueing     Toileting Toileting    Toileting assist Assist for toileting: Contact Guard/Touching assist     Transfers Chair/bed transfer  Transfers assist     Chair/bed transfer assist level: Supervision/Verbal cueing     Locomotion Ambulation   Ambulation assist      Assist level: Contact Guard/Touching assist Assistive device: Walker-rolling Max distance: 142ft   Walk 10 feet activity   Assist     Assist level: Contact Guard/Touching assist Assistive device: Walker-rolling   Walk 50 feet activity   Assist    Assist level: Contact Guard/Touching assist Assistive device: Walker-rolling    Walk 150 feet activity   Assist    Assist level: Contact Guard/Touching assist Assistive device: Walker-rolling    Walk 10 feet on uneven surface  activity   Assist     Assist level: Contact Guard/Touching assist Assistive device: Walker-rolling   Wheelchair     Assist Will patient use wheelchair at discharge?: No             Wheelchair 50 feet with 2 turns activity    Assist  Wheelchair 150 feet activity     Assist          Blood pressure 136/63, pulse 78, temperature 97.9 F (36.6 C), resp. rate 16, height 4' 7.98" (1.422 m), weight 78 kg, SpO2 100 %.  Medical Problem List and Plan: 1.  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  Antithrombotics: -DVT/anticoagulation:  Mechanical: Sequential compression devices, below knee Bilateral lower extremities             -antiplatelet therapy: N/a 3. Chronic pain due to migraines/Pain Management: Stadol or Tylenol prn.  4. Mood: LCSW to follow for evaluation and support.              -antipsychotic agents: N/A 5. Neuropsych: This patient is capable of making decisions on her own behalf. 6. Skin/Wound Care: Routine pressure relief measures.  7. Fluids/Electrolytes/Nutrition: Monitor I/O. Electrolytes stable 12/8: monitor weekly.  8. HTN: Monitor BP tid--continue Norvasc daily.  Vitals:   09/10/20 0514 09/10/20 1344  BP: (!) 146/71 136/63  Pulse: 78 78  Resp: 17 16  Temp: 97.8 F (36.6 C) 97.9 F (36.6 C)  SpO2: 94% 100%  improved this am cont amlodipine 5mg    63/87: systolic BP slightly elevated- other reads normal- continue to monitor.  9. T2DM with peripheral neuropathy: Hgb A1C-8.1. Will monitor BS ac/hs.( Home dose glucotrol 10mg  BID  and NPH insulin 25U BID.)  CBG (last 3)  Recent Labs    09/09/20 2053 09/10/20 0554 09/10/20 1153  GLUCAP 353* 143* 213*  cont glipizide 10mg  qam  Increase novulin N to 7U BID  On 12/9 , increased to 9 u 12/10  12/11: CBG elevated to 353 last night: discussed with patient: she will avoid late meals.  10. Seizure disorder: Managed on home dose Phenobarbital. No seizure for 30+ years on meds 11.  CKD: Baseline SCr- has been around 1.6 per records. Now down to 1.3---> will monitor with serial checks. Avoid nephrotoxic medications.  12. ABLA: Recheck CBC in am.  13. Left shoulder/wrist pain: Getting better --reports fall at admission. Shoulder X ray negative--, wrist xray shows healt Colles fx  Will add Voltaren gel.     LOS: 4 days A FACE TO FACE EVALUATION WAS PERFORMED  Clide Deutscher Jaella Weinert 09/10/2020, 3:54 PM

## 2020-09-10 NOTE — Progress Notes (Signed)
Physical Therapy Session Note  Patient Details  Name: Natalie [name] MRN: [id number] Date of Birth: 31-Oct-1950  Today's Date: 09/10/2020 PT Individual Time: 1035-1130 1545-1630 PT Individual Time Calculation (min): 55 min and 45 min   Short Term Goals: Week 1:  PT Short Term Goal 1 (Week 1): STG= LTG based on ELOS  Skilled Therapeutic Interventions/Progress Updates:   Pt received supine in bed and agreeable to PT. Supine>sit transfer without assist and min cues of bed rail. Pt able to don socks EOB with supervision assist and increased time. Min assist for shoe management. Stand pivot transfer to WC.   Dynamic standing balance while engaged in reaching/cognitive task of BITS system:  Path finding. X 3 with UE supported on the first and no UE support trials 2 and 3.  Visual scanning: random and then pathfinding No LOB noted throughout supervision A-CGA for safety with reaching outside BOS to end range.   Dynamic gait training with RW 66f x 2, forward/reverse x10 with RW x 2 and without AD x 2. Side stepping R and L  118fx 2 supervision assist with RW and min-CGA without AD. Min cues for improved step height on the LLE to prevent drag and LOB. One near LOB forward but able to correct with min assist.   Patient returned to room and performed stand pivot to recliner with min assist and no AD. Pt left sitting in recliner with call bell in reach and all needs met.    Session 2.   Pt received sitting in recliner and agreeable to PT. [facility] supervision assist. Pt transported to day room.  PT instructed pt in dynamic balance/endurance training with wii fit. Table tilt x 2 with RW for UE support on first bout and no UE support on the second CGA fading to min assist without UE support and moderate cues for improved use of ankle strategy to correct lateral LOB as well as improve COM control.  Pt also instructed in dynamic balance while standing on  airex pad while engages in wii bowling with 1-2 UE support on RW. CGA throughout to prevent forward LOB intermittently with arm swing.   Patient returned to room and performed stand pivot to recliner with RW and supervision assist. Pt left sitting in recliner with call bell in reach and all needs met.            Therapy Documentation Precautions:  Precautions Precautions: Fall Restrictions Weight Bearing Restrictions: No Other Position/Activity Restrictions: SBP <140   Pain: Pain Assessment Pain Scale: 0-10 Pain Score: 5  Pain Type: Acute pain Pain Location: Leg Pain Orientation: Left Pain Descriptors / Indicators: Aching;Spasm Pain Onset: Gradual Pain Intervention(s): Medication (See eMAR)    Therapy/Group: Individual Therapy  AuLorie Phenix2/08/2020, 11:43 AM

## 2020-09-10 NOTE — Progress Notes (Signed)
HS blood sugar 353, patient had just eaten a chicken salad sandwich. 5 units of novolog and 7 units of NPH given at 2205. C/O HA, PRN stadol given at 2218. PRN ambien given at 2233. Slept good. BLE edema. Right foot tender to touch, with complaint of numbness and tingling, not a new complaint. Patient reports she had surgery to that foot a year ago. Old scar noted to top of right foot. Patrici Ranks A

## 2020-09-10 NOTE — Progress Notes (Signed)
Occupational Therapy Session Note  Patient Details  Name: Natalie [name] MRN: [id number] Date of Birth: 03-Sep-1951  Today's Date: 09/10/2020 OT Individual Time: 6301-6010 and 1400-1430 OT Individual Time Calculation (min): 60 min and 30 min   Short Term Goals: Week 1:  OT Short Term Goal 1 (Week 1): LTG = STG 2/2 short stay     Skilled Therapeutic Interventions/Progress Updates:    Visit 1: Pain: Pt received in wc sleeping but awoke easily. She stated she was feeling extremely tired but agreeable to a shower this am. Pt used RW to ambulate to dresser to pick out clothing and then ambulated to toilet then to shower all with S only.   Pt completed shower in sitting with mod I and then S with standing in shower. She then ambulated to arm chair in room to dress.  She did well dressing and donned R sock but R shoe too tight.  So doffed and donned non slip socks. Did need A with L sock due to height of chair and decreased L hand coordination.   Excellent use of LUE.  To give her a rest break prior to PT session, pt rested in bed. Bed alarm set and all needs met.   Visit 2: Pain: no c/o pain  Pt received in recliner and ready for therapy. Pt used RW to ambulate to sink to complete oral care in standing and then ambulated with CGA with RW to day room to work on L UE [facility] with cards. Did several tasks with cards shuffling cards, picking them up one at a time, moving them out of stack with various types of pinch and grasp. Pt participated well.  Returned to room to rest in recliner prior to next therapy. Seat alarm on and all needs met.  Therapy Documentation Precautions:  Precautions Precautions: Fall Restrictions Weight Bearing Restrictions: No Other Position/Activity Restrictions: SBP <140    Vital Signs:  Pain: Pain Assessment Pain Score: 5  ADL: ADL Eating: Independent (seated) Grooming: Supervision/safety Where Assessed-Grooming: Edge of bed Upper Body Bathing:  Supervision/safety Where Assessed-Upper Body Bathing: Shower (TTB) Lower Body Bathing: Supervision/safety Where Assessed-Lower Body Bathing: Shower (TTB) Upper Body Dressing: Supervision/safety Where Assessed-Upper Body Dressing: Edge of bed Lower Body Dressing: Supervision/safety Where Assessed-Lower Body Dressing: Edge of bed Toileting: Supervision/safety Where Assessed-Toileting: Glass blower/designer: Close supervision Armed forces technical officer Method: Magazine features editor: Close supervision Social research officer, government Method: Heritage manager: Radio broadcast assistant   Therapy/Group: Individual Therapy  [place] 09/10/2020, 12:45 PM

## 2020-09-10 NOTE — Plan of Care (Signed)
  Problem: Consults Goal: RH GENERAL PATIENT EDUCATION Description: See Patient Education module for education specifics. Outcome: Progressing Goal: Skin Care Protocol Initiated - if Braden Score 18 or less Description: If consults are not indicated, leave blank or document N/A Outcome: Progressing Goal: Diabetes Guidelines if Diabetic/Glucose > 140 Description: If diabetic or lab glucose is > 140 mg/dl - Initiate Diabetes/Hyperglycemia Guidelines & Document Interventions  Outcome: Progressing   Problem: RH BOWEL ELIMINATION Goal: RH STG MANAGE BOWEL WITH ASSISTANCE Description: STG Manage Bowel with  min Assistance. Outcome: Progressing Goal: RH STG MANAGE BOWEL W/MEDICATION W/ASSISTANCE Description: STG Manage Bowel with Medication with [place]. Outcome: Progressing   Problem: RH BLADDER ELIMINATION Goal: RH STG MANAGE BLADDER WITH ASSISTANCE Description: STG Manage Bladder With min  Assistance Outcome: Progressing   Problem: RH SKIN INTEGRITY Goal: RH STG MAINTAIN SKIN INTEGRITY WITH ASSISTANCE Description: STG Maintain Skin Integrity With  min Assistance. Outcome: Progressing   Problem: RH SAFETY Goal: RH STG ADHERE TO SAFETY PRECAUTIONS W/ASSISTANCE/DEVICE Description: STG Adhere to Safety Precautions With min  Assistance/Device. Outcome: Progressing   Problem: RH PAIN MANAGEMENT Goal: RH STG PAIN MANAGED AT OR BELOW PT'S PAIN GOAL Description: Pain level less than 4 on scale of 0-10. Outcome: Progressing   Problem: Consults Goal: RH STROKE PATIENT EDUCATION Description: See Patient Education module for education specifics  Outcome: Progressing   Problem: RH KNOWLEDGE DEFICIT Goal: RH STG INCREASE KNOWLEDGE OF DIABETES Description: Patient will be able to manage DM with medications and dietary modifications using handouts and educational materials with cues\/reminders Outcome: Progressing Goal: RH STG INCREASE KNOWLEDGE OF HYPERTENSION Description: Patient  will be able to manage HTN with medications and dietary modifications using handouts and educational materials with cues\/reminders Outcome: Progressing Goal: RH STG INCREASE KNOWLEGDE OF HYPERLIPIDEMIA Description: Patient will be able to manage HLD with medications and dietary modifications using handouts and educational materials with cues\/reminders Outcome: Progressing Goal: RH STG INCREASE KNOWLEDGE OF STROKE PROPHYLAXIS Description: Patient will be able to manage secondary stroke risks with medications and dietary modifications using handouts and educational materials with cues\/reminders Outcome: Progressing

## 2020-09-11 LAB — GLUCOSE, CAPILLARY
Glucose-Capillary: 151 mg/dL — ABNORMAL HIGH (ref 70–99)
Glucose-Capillary: 169 mg/dL — ABNORMAL HIGH (ref 70–99)
Glucose-Capillary: 145 mg/dL — ABNORMAL HIGH (ref 70–99)
Glucose-Capillary: 187 mg/dL — ABNORMAL HIGH (ref 70–99)

## 2020-09-11 MED ORDER — SENNOSIDES-DOCUSATE SODIUM 8.6-50 MG PO TABS: 1.0000 | ORAL_TABLET | Freq: Two times a day (BID) | ORAL | Status: DC | PRN

## 2020-09-11 MED ORDER — INSULIN NPH (HUMAN) (ISOPHANE) 100 UNIT/ML ~~LOC~~ SUSP
8.0000 [IU] | Freq: Two times a day (BID) | SUBCUTANEOUS | Status: DC
  Administered 2020-09-11 – 2020-09-15 (×8): 8 [IU] via SUBCUTANEOUS

## 2020-09-11 NOTE — Progress Notes (Signed)
PHYSICAL MEDICINE & REHABILITATION PROGRESS NOTE   Subjective/Complaints: No complaints this morning Husband is at bedside and has no concerns Says she is moving bowels regularly and would like senna to be made prn.  Denies pain  ROS- denies CP, SOB, N/V/D, +fatigue  Objective:   No results found. No results for input(s): WBC, HGB, HCT, PLT in the last 72 hours. No results for input(s): NA, K, CL, CO2, GLUCOSE, BUN, CREATININE, CALCIUM in the last 72 hours.  Intake/Output Summary (Last 24 hours) at 09/11/2020 1444 Last data filed at 09/11/2020 1313 Gross per 24 hour  Intake 840 ml  Output --  Net 840 ml        Physical Exam: Vital Signs Blood pressure 138/64, pulse 77, temperature 97.9 F (36.6 C), temperature source Oral, resp. rate 14, height 4' 7.98" (1.422 m), weight 78 kg, SpO2 96 %. Gen: no distress, normal appearing HEENT: oral mucosa pink and moist, NCAT Cardio: Reg rate Chest: normal effort, normal rate of breathing Abd: soft, non-distended Motor strength 5/5 in B Delt, bi, tri, grip, HF , KE ADF Cerebellar exam normal finger to nose to finger as well as heel to shin in bilateral upper and lower extremities Musculoskeletal: Full range of motion in all 4 extremities. No joint swelling   Assessment/Plan: 1. Functional deficits which require 3+ hours per day of interdisciplinary therapy in a comprehensive inpatient rehab setting.  Physiatrist is providing close team supervision and 24 hour management of active medical problems listed below.  Physiatrist and rehab team continue to assess barriers to discharge/monitor patient progress toward functional and medical goals  Care Tool:  Bathing    Body parts bathed by patient: Right arm,Face,Left arm,Abdomen,Chest,Front perineal area,Buttocks,Right upper leg,Left upper leg,Right lower leg,Left lower leg         Bathing assist Assist Level: Supervision/Verbal cueing     Upper Body  Dressing/Undressing Upper body dressing   What is the patient wearing?: Bra,Pull over shirt    Upper body assist Assist Level: Set up assist    Lower Body Dressing/Undressing Lower body dressing      What is the patient wearing?: Underwear/pull up,Pants     Lower body assist Assist for lower body dressing: Supervision/Verbal cueing     Toileting Toileting    Toileting assist Assist for toileting: Contact Guard/Touching assist     Transfers Chair/bed transfer  Transfers assist     Chair/bed transfer assist level: Supervision/Verbal cueing     Locomotion Ambulation   Ambulation assist      Assist level: Contact Guard/Touching assist Assistive device: Walker-rolling Max distance: 158ft   Walk 10 feet activity   Assist     Assist level: Contact Guard/Touching assist Assistive device: Walker-rolling   Walk 50 feet activity   Assist    Assist level: Contact Guard/Touching assist Assistive device: Walker-rolling    Walk 150 feet activity   Assist    Assist level: Contact Guard/Touching assist Assistive device: Walker-rolling    Walk 10 feet on uneven surface  activity   Assist     Assist level: Contact Guard/Touching assist Assistive device: Walker-rolling   Wheelchair     Assist Will patient use wheelchair at discharge?: No             Wheelchair 50 feet with 2 turns activity    Assist            Wheelchair 150 feet activity     Assist  Blood pressure 138/64, pulse 77, temperature 97.9 F (36.6 C), temperature source Oral, resp. rate 14, height 4' 7.98" (1.422 m), weight 78 kg, SpO2 96 %.  Medical Problem List and Plan: 1.  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  Antithrombotics: -DVT/anticoagulation:   Mechanical: Sequential compression devices, below knee Bilateral lower extremities             -antiplatelet therapy: N/a 3. Chronic pain due to migraines/Pain Management: Stadol or Tylenol prn.  4. Mood: LCSW to follow for evaluation and support.              -antipsychotic agents: N/A 5. Neuropsych: This patient is capable of making decisions on her own behalf. 6. Skin/Wound Care: Routine pressure relief measures.  7. Fluids/Electrolytes/Nutrition: Monitor I/O. Electrolytes stable 12/8: monitor weekly.  8. HTN: Monitor BP tid--continue Norvasc daily.  Vitals:   09/11/20 0515 09/11/20 1331  BP: 139/77 138/64  Pulse: 75 77  Resp: 18 14  Temp: 98.1 F (36.7 C) 97.9 F (36.6 C)  SpO2: 97% 96%  improved this am cont amlodipine 5mg    12/12: Bp well controlled- continue norvasc.  9. T2DM with peripheral neuropathy: Hgb A1C-8.1. Will monitor BS ac/hs.( Home dose glucotrol 10mg  BID  and NPH insulin 25U BID.)  CBG (last 3)  Recent Labs    09/10/20 2111 09/11/20 0654 09/11/20 1132  GLUCAP 321* 151* 169*  cont glipizide 10mg  qam  Increase novulin N to 7U BID  On 12/9 , increased to 9 u 12/10  12/12: CBG elevated to 300s 2 days, increase Novolin to 8U.  10. Seizure disorder: Managed on home dose Phenobarbital. No seizure for 30+ years on meds 11.  CKD: Baseline SCr- has been around 1.6 per records. Now down to 1.3---> will monitor with serial checks. Avoid nephrotoxic medications.  12. ABLA: Hgb 10.2 on 12/8, repeat 12/13 13. Left shoulder/wrist pain: Getting better --reports fall at admission. Shoulder X ray negative--, wrist xray shows healt Colles fx  Will add Voltaren gel.     LOS: 5 days A FACE TO FACE EVALUATION WAS PERFORMED  Natalie [name] Natalie [name] 09/11/2020, 2:44 PM

## 2020-09-11 NOTE — Progress Notes (Signed)
Tearful at start of shift change, missing family get together. Declined scheduled senna s. CBG 321 at HS, reports getting a piece of cake on supper tray. 4 units of novolog given along with scheduled NPH. PRN ambien given at 2100, slept good. Natalie [name] A

## 2020-09-11 NOTE — Plan of Care (Signed)
  Problem: Consults Goal: RH GENERAL PATIENT EDUCATION Description: See Patient Education module for education specifics. 09/11/2020 0732 by Hillery Jacks, RN Outcome: Progressing 09/10/2020 1855 by Hillery Jacks, RN Outcome: Progressing Goal: Skin Care Protocol Initiated - if Braden Score 18 or less Description: If consults are not indicated, leave blank or document N/A 09/11/2020 0732 by Hillery Jacks, RN Outcome: Progressing 09/10/2020 1855 by Hillery Jacks, RN Outcome: Progressing Goal: Diabetes Guidelines if Diabetic/Glucose > 140 Description: If diabetic or lab glucose is > 140 mg/dl - Initiate Diabetes/Hyperglycemia Guidelines & Document Interventions  09/11/2020 0732 by Hillery Jacks, RN Outcome: Progressing 09/10/2020 1855 by Hillery Jacks, RN Outcome: Progressing   Problem: RH BOWEL ELIMINATION Goal: RH STG MANAGE BOWEL WITH ASSISTANCE Description: STG Manage Bowel with  min Assistance. 09/11/2020 0732 by Hillery Jacks, RN Outcome: Progressing 09/10/2020 1855 by Hillery Jacks, RN Outcome: Progressing Goal: RH STG MANAGE BOWEL W/MEDICATION W/ASSISTANCE Description: STG Manage Bowel with Medication with [place]. 09/11/2020 0732 by Hillery Jacks, RN Outcome: Progressing 09/10/2020 1855 by Hillery Jacks, RN Outcome: Progressing   Problem: RH BLADDER ELIMINATION Goal: RH STG MANAGE BLADDER WITH ASSISTANCE Description: STG Manage Bladder With min  Assistance 09/11/2020 0732 by Hillery Jacks, RN Outcome: Progressing 09/10/2020 1855 by Hillery Jacks, RN Outcome: Progressing   Problem: RH SKIN INTEGRITY Goal: RH STG MAINTAIN SKIN INTEGRITY WITH ASSISTANCE Description: STG Maintain Skin Integrity With  min Assistance. 09/11/2020 0732 by Hillery Jacks, RN Outcome: Progressing 09/10/2020 1855 by Hillery Jacks, RN Outcome: Progressing   Problem: RH SAFETY Goal: RH STG ADHERE TO SAFETY PRECAUTIONS W/ASSISTANCE/DEVICE Description: STG  Adhere to Safety Precautions With min  Assistance/Device. 09/11/2020 0732 by Hillery Jacks, RN Outcome: Progressing 09/10/2020 1855 by Hillery Jacks, RN Outcome: Progressing   Problem: RH PAIN MANAGEMENT Goal: RH STG PAIN MANAGED AT OR BELOW PT'S PAIN GOAL Description: Pain level less than 4 on scale of 0-10. 09/11/2020 0732 by Hillery Jacks, RN Outcome: Progressing 09/10/2020 1855 by Hillery Jacks, RN Outcome: Progressing   Problem: Consults Goal: RH STROKE PATIENT EDUCATION Description: See Patient Education module for education specifics  09/11/2020 0732 by Hillery Jacks, RN Outcome: Progressing 09/10/2020 1855 by Hillery Jacks, RN Outcome: Progressing   Problem: RH KNOWLEDGE DEFICIT Goal: RH STG INCREASE KNOWLEDGE OF DIABETES Description: Patient will be able to manage DM with medications and dietary modifications using handouts and educational materials with cues\/reminders 09/11/2020 0732 by Hillery Jacks, RN Outcome: Progressing 09/10/2020 1855 by Hillery Jacks, RN Outcome: Progressing Goal: RH STG INCREASE KNOWLEDGE OF HYPERTENSION Description: Patient will be able to manage HTN with medications and dietary modifications using handouts and educational materials with cues\/reminders 09/11/2020 0732 by Hillery Jacks, RN Outcome: Progressing 09/10/2020 1855 by Hillery Jacks, RN Outcome: Progressing Goal: RH STG INCREASE KNOWLEGDE OF HYPERLIPIDEMIA Description: Patient will be able to manage HLD with medications and dietary modifications using handouts and educational materials with cues\/reminders 09/11/2020 0732 by Hillery Jacks, RN Outcome: Progressing 09/10/2020 1855 by Hillery Jacks, RN Outcome: Progressing Goal: RH STG INCREASE KNOWLEDGE OF STROKE PROPHYLAXIS Description: Patient will be able to manage secondary stroke risks with medications and dietary modifications using handouts and educational materials with cues\/reminders 09/11/2020  0732 by Hillery Jacks, RN Outcome: Progressing 09/10/2020 1855 by Hillery Jacks, RN Outcome: Progressing

## 2020-09-12 ENCOUNTER — Inpatient Hospital Stay (HOSPITAL_COMMUNITY): Payer: Medicare Other | Admitting: Physical Therapy

## 2020-09-12 ENCOUNTER — Inpatient Hospital Stay (HOSPITAL_COMMUNITY): Payer: Medicare Other | Admitting: Occupational Therapy

## 2020-09-12 LAB — BASIC METABOLIC PANEL
Anion gap: 9 (ref 5–15)
BUN: 26 mg/dL — ABNORMAL HIGH (ref 8–23)
CO2: 24 mmol/L (ref 22–32)
Calcium: 9.4 mg/dL (ref 8.9–10.3)
Chloride: 106 mmol/L (ref 98–111)
Creatinine, Ser: 1.11 mg/dL — ABNORMAL HIGH (ref 0.44–1.00)
GFR, Estimated: 53 mL/min — ABNORMAL LOW (ref >60)
Glucose, Bld: 174 mg/dL — ABNORMAL HIGH (ref 70–99)
Potassium: 3.8 mmol/L (ref 3.5–5.1)
Sodium: 139 mmol/L (ref 135–145)

## 2020-09-12 LAB — CBC
HCT: 32.9 % — ABNORMAL LOW (ref 36.0–46.0)
Hemoglobin: 10.3 g/dL — ABNORMAL LOW (ref 12.0–15.0)
MCH: 29.1 pg (ref 26.0–34.0)
MCHC: 31.3 g/dL (ref 30.0–36.0)
MCV: 92.9 fL (ref 80.0–100.0)
Platelets: 212 10*3/uL (ref 150–400)
RBC: 3.54 MIL/uL — ABNORMAL LOW (ref 3.87–5.11)
RDW: 14.3 % (ref 11.5–15.5)
WBC: 7.1 10*3/uL (ref 4.0–10.5)
nRBC: 0 % (ref 0.0–0.2)

## 2020-09-12 LAB — GLUCOSE, CAPILLARY
Glucose-Capillary: 157 mg/dL — ABNORMAL HIGH (ref 70–99)
Glucose-Capillary: 141 mg/dL — ABNORMAL HIGH (ref 70–99)
Glucose-Capillary: 146 mg/dL — ABNORMAL HIGH (ref 70–99)
Glucose-Capillary: 172 mg/dL — ABNORMAL HIGH (ref 70–99)

## 2020-09-12 MED ORDER — POLYETHYLENE GLYCOL 3350 17 G PO PACK: 17.0000 g | PACK | Freq: Every day | ORAL | Status: DC | PRN

## 2020-09-12 NOTE — Progress Notes (Signed)
Occupational Therapy Session Note  Patient Details  Name: Natalie [name] MRN: [id number] Date of Birth: [birth year]  Today's Date: 09/12/2020 OT Individual Time: 1101-1159 OT Individual Time Calculation (min): 58 min    Short Term Goals: Week 1:  OT Short Term Goal 1 (Week 1): LTG = STG 2/2 short stay  Skilled Therapeutic Interventions/Progress Updates:    Pt received in chair with chair alarm engaged. Sit > stand with RW with close supervision 2/2 decreased dynamic standing balance. Amb to bathroom and completed toilet transfer, anterior peri care in standing, and clothing management with close supervision. Req min VC to keep RW on floor and not pick up. Washed hands at sink with RW with close supervision. Amb to therapy kitchen to practice light meal prep with overall close supervision for balance. Gathered all materials from lower cabinets + counter top to make instant pudding (pour and mix wet and dry ingredients.) Stood for ~5 min to whisk pudding. Amb to sink to wash dishes with RW + min A to bring items over. Washed whisk, bowl, and measuring cups. Amb to gym with RW + close supervision. To promote LUE NMR, seated on mat, pt reached and placed ~15 clothespins of the highest resistance first at shoulder height then head height. Additionally practiced placing pins from within to outside of her BOS. Overall close supervision for dynamic sitting balance.  Amb back to room with RW + close supervision. Left in chair with chair alarm engaged, call bell in reach, and NT present.  Therapy Documentation Precautions:  Precautions Precautions: Fall Restrictions Weight Bearing Restrictions: No Other Position/Activity Restrictions: SBP <140  Pain: Pain Assessment Pain Scale: 0-10 Pain Score: 0-No pain   ADL: See Care Tool for more details.   Therapy/Group: Individual Therapy  Volanda Napoleon, MS, OTR/L Clyda Greener OTR/L  09/12/2020, 12:06 PM

## 2020-09-12 NOTE — Progress Notes (Signed)
Occupational Therapy Session Note  Patient Details  Name: Natalie [name] MRN: [id number] Date of Birth: [birth year]  Today's Date: 09/12/2020 OT Individual Time: 4650-3546 OT Individual Time Calculation (min): 69 min    Short Term Goals: Week 1:  OT Short Term Goal 1 (Week 1): LTG = STG 2/2 short stay   Skilled Therapeutic Interventions/Progress Updates:    pt greeted at time of session sitting up in recliner with husband [name] present, no pain throughout session. Pt's husband had questions about date/time of family ed, spoke with SW and relayed time and plan to husband for this Wed 9-12 and he verbalized understanding. Pt declined ADL, agreeable to going to gym. Walked to/from room <> gym with CGA with RW, cues for proximity to walker. Practiced walk in shower transfer x2 as pt states she will use walk in shower at home, planning to get grab bars as well. After therapist demonstration, pt performed with CGA with cues for sequencing and to not pick up her walker. Nustep for 10 mins total no rest breaks on level 5 for NMR and to promote symmetrical movement patterns. Once EOM, pt performed bimanual tasks with small therapy ball for chest in/outs and overhead movements to promote typical movement patterns and NMR in LUE. Walked back to room in same manner as above, set up in recliner alarm on call bell in reach.   Therapy Documentation Precautions:  Precautions Precautions: Fall Restrictions Weight Bearing Restrictions: No Other Position/Activity Restrictions: SBP <140      Therapy/Group: Individual Therapy  Viona Gilmore 09/12/2020, 4:47 PM

## 2020-09-12 NOTE — Progress Notes (Signed)
Slept good after PRN ambien given. Complains of pain, numbness & tingling to right foot, not new, prior surgery per patient. Ate food brought in from home, for supper. Calls for assistance. Natalie [name] A

## 2020-09-12 NOTE — Progress Notes (Signed)
Physical Therapy Session Note  Patient Details  Name: Natalie [name] MRN: [id number] Date of Birth: 1950/11/08  Today's Date: 09/12/2020 PT Individual Time: 0800-0900 PT Individual Time Calculation (min): 60 min   Short Term Goals: Week 1:  PT Short Term Goal 1 (Week 1): STG= LTG based on ELOS  Skilled Therapeutic Interventions/Progress Updates: Pt presented in w/c agreeable to therapy. Pt c/o mild soreness in R foot which is chronic per pt. Pt requesting to get dressed before leaving room. Pt ambulated with RW to dresser to obtain clothes and returned to w/c. Pt was able to don all clothing and shoes with supervision overall and increased time. Pt then ambulated to sink and performed oral hygiene mod I. Nsg arrived to provide am meds with pt in w/c. Pt then ambulated to rehab gym with RW and supervision. Pt continues to have poor clearance of L foot but does not catch on ground and no LOB noted.  Participated in balance/coorination activities including toe taps to cones with RW for L NMR, then participated in wt shfiting activities stepping forward and laterally to target. Pt initially required minA for lateral wt shifting however with min tactile cues from PTA pt was able to improve to CGA overall. Pt ambulated back to room in same manner as prior and agreeable to sit in recliner. Pt left in recliner at end of session with seat alarm on, call bell within reach and needs met.      Therapy Documentation Precautions:  Precautions Precautions: Fall Restrictions Weight Bearing Restrictions: No Other Position/Activity Restrictions: SBP <140 General:   Vital Signs: Therapy Vitals Temp: 97.7 F (36.5 C) Temp Source: Oral Pulse Rate: 75 Resp: 16 BP: 126/64 Patient Position (if appropriate): Sitting Oxygen Therapy SpO2: 97 % O2 Device: Room Air    Therapy/Group: Individual Therapy  Jeorge Reister  Alyx Gee, PTA  09/12/2020, 4:21 PM

## 2020-09-12 NOTE — Progress Notes (Signed)
Patient ID: Natalie [name], female   DOB: 05/14/1951, 69 y.o.   MRN: [id number]   Family education scheduled on Wednesday, 12/15 [place], [place]

## 2020-09-12 NOTE — Progress Notes (Signed)
[facility] PHYSICAL MEDICINE & REHABILITATION PROGRESS NOTE   Subjective/Complaints:  No issues overnite   ROS- denies CP, SOB, N/V/D, +fatigue  Objective:   No results found. Recent Labs    09/12/20 0524  WBC 7.1  HGB 10.3*  HCT 32.9*  PLT 212   Recent Labs    09/12/20 0524  NA 139  K 3.8  CL 106  CO2 24  GLUCOSE 174*  BUN 26*  CREATININE 1.11*  CALCIUM 9.4    Intake/Output Summary (Last 24 hours) at 09/12/2020 0805 Last data filed at 09/12/2020 0801 Gross per 24 hour  Intake 580 ml  Output --  Net 580 ml        Physical Exam: Vital Signs Blood pressure (!) 149/67, pulse 85, temperature 98 F (36.7 C), resp. rate 16, height 4' 7.98" (1.422 m), weight 78 kg, SpO2 94 %.  General: No acute distress Mood and affect are appropriate Heart: Regular rate and rhythm no rubs murmurs or extra sounds Lungs: Clear to auscultation, breathing unlabored, no rales or wheezes Abdomen: Positive bowel sounds, soft nontender to palpation, nondistended Extremities: No clubbing, cyanosis, or edema Skin: No evidence of breakdown, no evidence of rash Neurologic: Cranial nerves II through XII intact, motor strength is 5/5 in bilateral deltoid, bicep, tricep, grip, hip flexor, knee extensors, ankle dorsiflexor and plantar flexor Sensory exam normal sensation to light touch and proprioception in bilateral upper and lower extremities Cerebellar exam normal finger to nose to finger as well as heel to shin in bilateral upper and lower extremities Musculoskeletal: Full range of motion in all 4 extremities. No joint swelling    Assessment/Plan: 1. Functional deficits which require 3+ hours per day of interdisciplinary therapy in a comprehensive inpatient rehab setting.  Physiatrist is providing close team supervision and 24 hour management of active medical problems listed below.  Physiatrist and rehab team continue to assess barriers to discharge/monitor patient progress toward  functional and medical goals  Care Tool:  Bathing    Body parts bathed by patient: Right arm,Face,Left arm,Abdomen,Chest,Front perineal area,Buttocks,Right upper leg,Left upper leg,Right lower leg,Left lower leg         Bathing assist Assist Level: Supervision/Verbal cueing     Upper Body Dressing/Undressing Upper body dressing   What is the patient wearing?: Bra,Pull over shirt    Upper body assist Assist Level: Set up assist    Lower Body Dressing/Undressing Lower body dressing      What is the patient wearing?: Underwear/pull up,Pants     Lower body assist Assist for lower body dressing: Supervision/Verbal cueing     Toileting Toileting    Toileting assist Assist for toileting: Contact Guard/Touching assist     Transfers Chair/bed transfer  Transfers assist     Chair/bed transfer assist level: Supervision/Verbal cueing     Locomotion Ambulation   Ambulation assist      Assist level: Contact Guard/Touching assist Assistive device: Walker-rolling Max distance: 171ft   Walk 10 feet activity   Assist     Assist level: Contact Guard/Touching assist Assistive device: Walker-rolling   Walk 50 feet activity   Assist    Assist level: Contact Guard/Touching assist Assistive device: Walker-rolling    Walk 150 feet activity   Assist    Assist level: Contact Guard/Touching assist Assistive device: Walker-rolling    Walk 10 feet on uneven surface  activity   Assist     Assist level: Contact Guard/Touching assist Assistive device: Chemical engineer  Assist Will patient use wheelchair at discharge?: No             Wheelchair 50 feet with 2 turns activity    Assist            Wheelchair 150 feet activity     Assist          Blood pressure (!) 149/67, pulse 85, temperature 98 F (36.7 C), resp. rate 16, height 4' 7.98" (1.422 m), weight 78 kg, SpO2 94 %.  Medical Problem List and  Plan: 1.  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  Antithrombotics: -DVT/anticoagulation:  Mechanical: Sequential compression devices, below knee Bilateral lower extremities             -antiplatelet therapy: N/a 3. Chronic pain due to migraines/Pain Management: Stadol or Tylenol prn.  4. Mood: LCSW to follow for evaluation and support.              -antipsychotic agents: N/A 5. Neuropsych: This patient is capable of making decisions on her own behalf. 6. Skin/Wound Care: Routine pressure relief measures.  7. Fluids/Electrolytes/Nutrition: Monitor I/O. Electrolytes stable 12/8: monitor weekly.  8. HTN: Monitor BP tid--continue Norvasc daily.  Vitals:   09/11/20 1930 09/12/20 0325  BP: (!) 142/78 (!) 149/67  Pulse: 76 85  Resp: 18 16  Temp: 98.6 F (37 C) 98 F (36.7 C)  SpO2: 98% 94%  improved this am cont amlodipine 5mg    12/13 fair BP control  9. T2DM with peripheral neuropathy: Hgb A1C-8.1. Will monitor BS ac/hs.( Home dose glucotrol 10mg  BID  and NPH insulin 25U BID.)  CBG (last 3)  Recent Labs    09/11/20 1628 09/11/20 2046 09/12/20 0625  GLUCAP 145* 187* 157*  cont glipizide 10mg  qam  Increase novulin N to 7U BID  On 12/9 , increased to 9 u 12/10  12/13 improved control cont to monitor on current dose  10. Seizure disorder: Managed on home dose Phenobarbital. No seizure for 30+ years on meds 11.  CKD: Baseline SCr- has been around 1.6 per records. Now down to 1.3---> will monitor with serial checks. Avoid nephrotoxic medications.  12. ABLA: Hgb 10.2 on 12/8, repeat 12/13 13. Left shoulder/wrist pain: Getting better --reports fall at admission. Shoulder X ray negative--, wrist xray shows healt Colles fx  Will add Voltaren gel.     LOS: 6 days A FACE TO FACE EVALUATION WAS  PERFORMED  Charlett Blake 09/12/2020, 8:05 AM

## 2020-09-13 ENCOUNTER — Inpatient Hospital Stay (HOSPITAL_COMMUNITY): Payer: Medicare Other | Admitting: Occupational Therapy

## 2020-09-13 ENCOUNTER — Inpatient Hospital Stay (HOSPITAL_COMMUNITY): Payer: Medicare Other | Admitting: Physical Therapy

## 2020-09-13 LAB — GLUCOSE, CAPILLARY
Glucose-Capillary: 163 mg/dL — ABNORMAL HIGH (ref 70–99)
Glucose-Capillary: 114 mg/dL — ABNORMAL HIGH (ref 70–99)
Glucose-Capillary: 114 mg/dL — ABNORMAL HIGH (ref 70–99)
Glucose-Capillary: 183 mg/dL — ABNORMAL HIGH (ref 70–99)

## 2020-09-13 NOTE — Progress Notes (Addendum)
Occupational Therapy Session Note  Patient Details  Name: Natalie [name] MRN: [id number] Date of Birth: 07/07/1951  Today's Date: 09/13/2020 OT Individual Time: 0802-0900 OT Individual Time Calculation (min): 58 min    Short Term Goals: Week 1:  OT Short Term Goal 1 (Week 1): LTG = STG 2/2 short stay  Skilled Therapeutic Interventions/Progress Updates:    Pt received seated EOB with husband present. Sit > stand with RW with close supervision for balance, amb to gather clothes off of cabinet with close supervision. Amb to bathroom in prep for shower with RW. Completed toilet transfer with RW + L grab bar with close supervision. On toilet, doffed bilat grip socks + night gown with mod I. Amb to TTB with RW + close supervision. Showered full-body with overall supervision. TTB transfer and Sit <> stand  to wash buttocks with close supervision. Amb to bed to get dressed with RW + close supervision. Donned bra, shirt, bilat gripper socks with setup A to retrieve from across room 2/2 time. Donned underwear + pants with close sup for sit<>stand. Sit>stand and amb to stink with RW + close supervision. Stood at sink with RW to brush teeth and comb hair with mod I. Amb to chair with RW + close supervision. Overall pt is close sup for dynamic standing and func transfers 2/2 impaired balance, otherwise setupA to mod I for stationary seated or standing ADLs. Discussed bathroom setup at home, pt states she is unsure if she'll use her existing shower stool/ walk-in combo or her tub/shower/TTB combo. Plans to purchase new TTB, discussed recommendations and where to purchase. Pt left in chair with chair alarm engaged, call bell in reach, husband and dietary staff present.  Therapy Documentation Precautions:  Precautions Precautions: Fall Restrictions Weight Bearing Restrictions: No Other Position/Activity Restrictions: SBP <140  Pain: Pain Assessment Pain Scale: 0-10 Pain Score: 0-No pain ADL: See Care Tool  for more details.   Therapy/Group: Individual Therapy  Volanda Napoleon, MS, OTR/L  09/13/2020, 11:36 AM

## 2020-09-13 NOTE — Progress Notes (Signed)
Occupational Therapy Session Note  Patient Details  Name: Natalie [name] MRN: [id number] Date of Birth: [birth year]  Today's Date: 09/13/2020 OT Individual Time: 1300-1415 OT Individual Time Calculation (min): 75 min    Short Term Goals: Week 1:  OT Short Term Goal 1 (Week 1): LTG = STG 2/2 short stay  Skilled Therapeutic Interventions/Progress Updates:    Patient seated in recliner, alert and ready for therapy session.  She completed lunch mod I, able to open containers and manage all aspects without difficulty.  Reviewed home set up and plan for home management tasks.  Daughter and husband able to support lifting tasks.  Encouraged her to engage in folding laundry and light meal prep to promote return to function.  Reviewed and practiced basic reach and transport of items with RW.  She requires CS with mobility with walker and min cues for technique.  Reviewed DME options and safe technique for transfers - she declined practice in shower at this time stating that she will complete tomorrow during family education session.  Patient ambulated on unit >250 feet with RW CS, mild LOB to the right with CGA to regain on one occasion.  Completed standing balance and trunk/core mobility activities with CG/CS, completed hand dexterity activities.  She returned to recliner at close of session, seat alarm set and call bell in reach.    Therapy Documentation Precautions:  Precautions Precautions: Fall Restrictions Weight Bearing Restrictions: No Other Position/Activity Restrictions: SBP <140   Therapy/Group: Individual Therapy  Carlos Levering 09/13/2020, 9:13 AM

## 2020-09-13 NOTE — Progress Notes (Signed)
Physical Therapy Session Note  Patient Details  Name: Natalie [name] MRN: [id number] Date of Birth: 02/11/1951  Today's Date: 09/13/2020 PT Individual Time: 1020-1115 PT Individual Time Calculation (min): 55 min   Short Term Goals: Week 1:  PT Short Term Goal 1 (Week 1): STG= LTG based on ELOS  Skilled Therapeutic Interventions/Progress Updates: Pt presented in recliner with husband present agreeable to therapy. Pt denies pain at start of session. Pt donned socks and shoes with set up. Pt then ambulated to rehab gym with RW and supervision with PTA noted longer bouts of ambulation with increased L foot clearance this session. Pt participated in ascending/descending x 4 steps with B rails and supervision with verbal cues to make sure that L foot is fully placed on step. Pt then participated in use of agility ladder stepping forwards and sideways x 8 each in parallel bars with emphasis on L foot clearing rungs. During sidestepping PTA providing facilitation at hips to minimize rotation. Pt also participated in gait with RW stepping over cones 2 x 8 with pt missing 1 cone overall. Pt ambulated back to room at end of session in same manner as prior and returned to recliner. Pt left with seat alarm on, call bell within reach and needs met.     Therapy Documentation Precautions:  Precautions Precautions: Fall Restrictions Weight Bearing Restrictions: No Other Position/Activity Restrictions: SBP <140 General:   Vital Signs: Therapy Vitals Temp: 98.3 F (36.8 C) Pulse Rate: 80 Resp: 16 BP: (!) 148/78 Patient Position (if appropriate): Sitting Oxygen Therapy SpO2: 97 % O2 Device: Room Air Pain:   Mobility:   Locomotion :    Trunk/Postural Assessment :    Balance:   Exercises:   Other Treatments:      Therapy/Group: Individual Therapy  Alexes Lamarque 09/13/2020, 4:27 PM

## 2020-09-13 NOTE — Progress Notes (Signed)
[facility] PHYSICAL MEDICINE & REHABILITATION PROGRESS NOTE   Subjective/Complaints: No issues overnight No complaints this morning. Denies pain, constipation, insomnia.  Husband has no concerns  ROS- denies CP, SOB, N/V/D, +fatigue  Objective:   No results found. Recent Labs    09/12/20 0524  WBC 7.1  HGB 10.3*  HCT 32.9*  PLT 212   Recent Labs    09/12/20 0524  NA 139  K 3.8  CL 106  CO2 24  GLUCOSE 174*  BUN 26*  CREATININE 1.11*  CALCIUM 9.4    Intake/Output Summary (Last 24 hours) at 09/13/2020 0910 Last data filed at 09/13/2020 0732 Gross per 24 hour  Intake 600 ml  Output --  Net 600 ml        Physical Exam: Vital Signs Blood pressure (!) 150/73, pulse 75, temperature 98.4 F (36.9 C), temperature source Oral, resp. rate 18, height 4' 7.98" (1.422 m), weight 78 kg, SpO2 95 %.  Gen: no distress, normal appearing HEENT: oral mucosa pink and moist, NCAT Cardio: Reg rate Chest: normal effort, normal rate of breathing Abd: soft, non-distended Ext: no edema Skin: intact Neurologic: Cranial nerves II through XII intact, motor strength is 5/5 in bilateral deltoid, bicep, tricep, grip, hip flexor, knee extensors, ankle dorsiflexor and plantar flexor Sensory exam normal sensation to light touch and proprioception in bilateral upper and lower extremities Cerebellar exam normal finger to nose to finger as well as heel to shin in bilateral upper and lower extremities Musculoskeletal: Full range of motion in all 4 extremities. No joint swelling    Assessment/Plan: 1. Functional deficits which require 3+ hours per day of interdisciplinary therapy in a comprehensive inpatient rehab setting.  Physiatrist is providing close team supervision and 24 hour management of active medical problems listed below.  Physiatrist and rehab team continue to assess barriers to discharge/monitor patient progress toward functional and medical goals  Care Tool:  Bathing     Body parts bathed by patient: Right arm,Face,Left arm,Abdomen,Chest,Front perineal area,Buttocks,Right upper leg,Left upper leg,Right lower leg,Left lower leg         Bathing assist Assist Level: Supervision/Verbal cueing     Upper Body Dressing/Undressing Upper body dressing   What is the patient wearing?: Bra,Pull over shirt    Upper body assist Assist Level: Set up assist    Lower Body Dressing/Undressing Lower body dressing      What is the patient wearing?: Underwear/pull up,Pants     Lower body assist Assist for lower body dressing: Supervision/Verbal cueing     Toileting Toileting    Toileting assist Assist for toileting: Supervision/Verbal cueing     Transfers Chair/bed transfer  Transfers assist     Chair/bed transfer assist level: Supervision/Verbal cueing     Locomotion Ambulation   Ambulation assist      Assist level: Contact Guard/Touching assist Assistive device: Walker-rolling Max distance: 300   Walk 10 feet activity   Assist     Assist level: Contact Guard/Touching assist Assistive device: Walker-rolling   Walk 50 feet activity   Assist    Assist level: Contact Guard/Touching assist Assistive device: Walker-rolling    Walk 150 feet activity   Assist    Assist level: Contact Guard/Touching assist Assistive device: Walker-rolling    Walk 10 feet on uneven surface  activity   Assist     Assist level: Contact Guard/Touching assist Assistive device: Walker-rolling   Wheelchair     Assist Will patient use wheelchair at discharge?: No  Wheelchair 50 feet with 2 turns activity    Assist            Wheelchair 150 feet activity     Assist          Blood pressure (!) 150/73, pulse 75, temperature 98.4 F (36.9 C), temperature source Oral, resp. rate 18, height 4' 7.98" (1.422 m), weight 78 kg, SpO2 95 %.  Medical Problem List and Plan: 1.  R corona radiata IPH secondary to  HTN- that's uncontrolled- with L hemiparesis             -patient may  shower             -ELOS/Goals: 10-14 days- mod I to supervision- currently Min A level for mobility and ADLs  -Continue CIR 2.  Antithrombotics: -DVT/anticoagulation:  Mechanical: Sequential compression devices, below knee Bilateral lower extremities             -antiplatelet therapy: N/a 3. Chronic pain due to migraines/Pain Management: Continue Stadol or Tylenol prn, pain is well controlled.  4. Mood: LCSW to follow for evaluation and support.              -antipsychotic agents: N/A 5. Neuropsych: This patient is capable of making decisions on her own behalf. 6. Skin/Wound Care: Routine pressure relief measures.  7. Fluids/Electrolytes/Nutrition: Monitor I/O. Electrolytes stable 12/8: monitor weekly.  8. HTN: Monitor BP tid--continue Norvasc daily.  Vitals:   09/12/20 2021 09/13/20 0515  BP: (!) 167/66 (!) 150/73  Pulse: 70 75  Resp: 18 18  Temp: 97.9 F (36.6 C) 98.4 F (36.9 C)  SpO2: 100% 95%  improved this am cont amlodipine 5mg    12/14: BP elevated to 017C-944H systolic, flowsheet reviewed, better controlled previous days so will not adjust regimen today- continue to monitor.  9. T2DM with peripheral neuropathy: Hgb A1C-8.1. Will monitor BS ac/hs.( Home dose glucotrol 10mg  BID  and NPH insulin 25U BID.)  CBG (last 3)  Recent Labs    09/12/20 1617 09/12/20 2113 09/13/20 0558  GLUCAP 146* 172* 163*  cont glipizide 10mg  qam  Increase novulin N to 7U BID  On 12/9 , increased to 9 u 12/10  12/14: improved control. Patient is receiving juice, potatoes, cake that she does not eat at home- advised her to request that she does not receive these foods.  10. Seizure disorder: Managed on home dose Phenobarbital. No seizure for 30+ years on meds 11.  CKD: Baseline SCr- has been around 1.6 per records. Now down to 1.3---> will monitor with serial checks. Avoid nephrotoxic medications.  12. ABLA: Hgb 10.2 on 12/8,  repeat 12/13 13. Left shoulder/wrist pain: Getting better --reports fall at admission. Shoulder X ray negative--, wrist xray shows healt Colles fx  Will add Voltaren gel.     LOS: 7 days A FACE TO FACE EVALUATION WAS PERFORMED  Jaziel Bennett P Judson Tsan 09/13/2020, 9:10 AM

## 2020-09-14 ENCOUNTER — Encounter (HOSPITAL_COMMUNITY): Payer: Medicare Other | Admitting: Occupational Therapy

## 2020-09-14 ENCOUNTER — Ambulatory Visit (HOSPITAL_COMMUNITY): Payer: Medicare Other | Admitting: Physical Therapy

## 2020-09-14 ENCOUNTER — Inpatient Hospital Stay (HOSPITAL_COMMUNITY): Payer: Medicare Other | Admitting: Physical Therapy

## 2020-09-14 LAB — GLUCOSE, CAPILLARY
Glucose-Capillary: 109 mg/dL — ABNORMAL HIGH (ref 70–99)
Glucose-Capillary: 105 mg/dL — ABNORMAL HIGH (ref 70–99)
Glucose-Capillary: 149 mg/dL — ABNORMAL HIGH (ref 70–99)
Glucose-Capillary: 161 mg/dL — ABNORMAL HIGH (ref 70–99)

## 2020-09-14 NOTE — Progress Notes (Signed)
Physical Therapy Session Note  Patient Details  Name: ILDA LASKIN MRN: [id number] Date of Birth: 06-18-1951  Today's Date: 09/14/2020 PT Individual Time: 0905-1002 PT Individual Time Calculation (min): 57 min   Short Term Goals: Week 1:  PT Short Term Goal 1 (Week 1): STG= LTG based on ELOS  Skilled Therapeutic Interventions/Progress Updates: Pt presented in recliner with family present agreeable to therapy. Pt denies pain and session focused on hands on family education. PTA discussed pt's current progress and function at supervision level. Pt then ambulated with RW to ortho gym and participated in car transfer to sedan height level. Pt then ambulated to ADL apt and participated in furniture transfer (to from recliner as what pt sits in at home) and was able to perform with supervision. Pt then ambulated to rehab gym and participated in stair training x12 with supervision and B rails. Participated in obstacle course with uneven/compliant surface, weaving through cones, and stepping over thresholds. With family present pt performed with overall close S level. Pt also participated in [employer] LOS with 41% when performed without UE support and demonstrated some improving ankle strategy with repetition. PTA provided education on energy conservation and to monitor pt in distracting environments as well as removing any area rugs/runners and taking caution during ambulation/turns at home on carpet. Pt then ambulated back to room and returned to recliner. Pt left in recliner with call bell within reach and family present.      Therapy Documentation Precautions:  Precautions Precautions: Fall Restrictions Weight Bearing Restrictions: No Other Position/Activity Restrictions: SBP <140   Therapy/Group: Individual Therapy  Eletha Culbertson  Kearston Putman, PTA  09/14/2020, 12:13 PM

## 2020-09-14 NOTE — Progress Notes (Signed)
Occupational Therapy Discharge Summary  Patient Details  Name: Natalie [name] MRN: [id number] Date of Birth: [birth year]  Today's Date: 09/14/2020 OT Individual Time: 1104-1200 OT Individual Time Calculation (min): 56 min    Patient has met 4 of 11 long term goals due to improved activity tolerance, improved balance, postural control and improved coordination.  Patient to discharge at overall Supervision level.  Patient's care partner is independent to provide the necessary physical assistance at discharge.    Reasons goals not met: Pt did not meet mod I goals for toilet transfer, LB bathing, and LB dressing due to impaired dynamic standing balance. Will require close supervision during these tasks due to minimal episodes of LOB.  Recommendation:  No f/u skilled OT recommended as pt is mod I to supervision with RW. Will have 24/7 supervision at home. High level balance deficits will be addressed by HHPT. Provided HEP for L FMC and BUE strength + level 1 theraband to maximize ADL/IADL performance.   Equipment: No equipment provided  Reasons for discharge: discharge from hospital  Patient/family agrees with progress made and goals achieved: Yes  OT Discharge Precautions/Restrictions  Precautions Precautions: Fall Restrictions Other Position/Activity Restrictions: SBP <140 Pain: C/o of L elbow pain. See session note. ADL ADL Eating: Independent Where Assessed-Eating: Chair Grooming: Modified independent Where Assessed-Grooming: Standing at sink,Edge of bed,Sitting at sink Upper Body Bathing: Setup Where Assessed-Upper Body Bathing: Shower Lower Body Bathing: Supervision/safety Where Assessed-Lower Body Bathing: Shower Upper Body Dressing: Setup Where Assessed-Upper Body Dressing: Edge of bed Lower Body Dressing: Supervision/safety Where Assessed-Lower Body Dressing: Edge of bed Toileting: Supervision/safety Where Assessed-Toileting: Glass blower/designer: Close  supervision Toilet Transfer Method: Counselling psychologist: Grab bars,Other (comment) (RW) Tub/Shower Transfer: Close supervison Clinical cytogeneticist Method: Ambulating (RW) Tub/Shower Equipment: Facilities manager: Close supervision Social research officer, government Method: Ambulating (RW) Youth worker: Gaffer Baseline Vision/History: Wears glasses;Cataracts Wears Glasses: Reading only Patient Visual Report: No change from baseline Vision Assessment?: No apparent visual deficits Visual Fields: No apparent deficits Perception  Perception: Within Functional Limits Praxis Praxis: Intact Cognition Overall Cognitive Status: Within Functional Limits for tasks assessed Arousal/Alertness: Awake/alert Orientation Level: Oriented X4 Attention: Sustained;Focused;Selective Safety/Judgment: Appears intact Sensation Sensation Light Touch: Not tested Hot/Cold: Not tested Proprioception: Not tested Stereognosis: Not tested Additional Comments: Pt reports remaining L hand numbness ~2 days ago, otherwise sensation and propioception of BUE grossly WFL during functional tasks. Coordination Gross Motor Movements are Fluid and Coordinated: Yes Fine Motor Movements are Fluid and Coordinated: Yes Coordination and Movement Description: Grossly WFL during bimanual ADLs and func mobility. Motor  Motor Motor: Other (comment) (mild LUE hemiparesis) Motor - Discharge Observations: mild LUE hemiparesis Mobility  Transfers Sit to Stand: Independent with assistive device Stand to Sit: Independent with assistive device  Trunk/Postural Assessment  Cervical Assessment Cervical Assessment: Within Functional Limits Thoracic Assessment Thoracic Assessment: Within Functional Limits Lumbar Assessment Lumbar Assessment: Within Functional Limits Postural Control Postural Control: Deficits on evaluation (req min A to maintain upright posture during  min LOB when amb)  Balance Balance Balance Assessed: Yes Static Sitting Balance Static Sitting - Balance Support: Feet supported Static Sitting - Level of Assistance: 6: Modified independent (Device/Increase time) Dynamic Sitting Balance Dynamic Sitting - Balance Support: Feet supported Dynamic Sitting - Level of Assistance: 5: Stand by assistance Static Standing Balance Static Standing - Balance Support: Bilateral upper extremity supported Static Standing - Level of Assistance: 6: Modified independent (Device/Increase time) Dynamic Standing Balance Dynamic  Standing - Balance Support: Bilateral upper extremity supported;During functional activity Dynamic Standing - Level of Assistance: 5: Stand by assistance Extremity/Trunk Assessment RUE Assessment RUE Assessment: Within Functional Limits Active Range of Motion (AROM) Comments: WFL General Strength Comments: WFL for ADLs  LUE Assessment LUE Assessment: Within Functional Limits Active Range of Motion (AROM) Comments: WFL General Strength Comments: Grossly WFL for ADLs   Session Note: Pt received in chair with daughter and husband present for family training. Sit > stand with RW with mod I. Amb to toilet with RW + close supervision. Toilet transfer with RW + L grab bar with close supervision. Pt states she has bilat grab bars on either side of toilet at home. Amb to therapy bathroom with RW + close sup for balance + min VCs to stay inside the walker. Simulated TTB with RW + close supervision to not slide forward. Pt states she has R grab bar on shower wall and family still needs to replace existing TTB. Discussed where to buy, cost, and how to adjust. Amb to therapy kitchen to practice carrying plates and navigating kitchen with RW. Picked up and moved plate down counter with RW with close supervision. Discussed kitchen set-up, pt states that she will be able to reach her table from the counter to place her plate. Sit <> stand in chair  with RW + mod I. Amb back to room with RW + close supervision. In chair in room, provided pt with level 1 theraband and printed BUE HEP to maximize func strength + LUE NMR. Pt performed 3x10 of the following exercises with close supervision for demonstration of exercises:   Seated Elbow Flexion with Self-Anchored Resistance   Seated Elbow Extension with Self-Anchored Resistance   Seated Bruegger with Resistance   Eccentric Wrist Extension with Resistance   Shoulder Flexion Serratus Activation with Resistance  C/o of L elbow pain with L elbow flexion, diminished with corrected form and activity cessation. Discussed HEP for L [facility] for additional L NMR (e.g., shuffle cards, picking up and manipulating coins.) Pt and family verbalized understanding. Daughter and husband present throughout and verbalized understanding of functional transfers and mobility with RW safety. Pt left in chair with chair alarm engaged, call bell in reach, and daughter and husband present.  Volanda Napoleon MS, OTR/L Clyda Greener OTR/L   09/14/2020, 5:01 PM

## 2020-09-14 NOTE — Patient Care Conference (Signed)
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 Alysia Penna Care Coodinator Present: Dorien Chihuahua, RN, BSN, CRRN;Christina Sampson Goon, [place] Nurse Present: Benjie Karvonen, RN PT Present: Phylliss Bob, PTA OT Present: Mariane Masters, OT PPS Coordinator present : Ileana Ladd, Burna Mortimer, SLP     Current Status/Progress Goal Weekly Team Focus  Bowel/Bladder   Continent of bowel and bladder. LBM 12/14  remain continent  assess toileting needs qshift and PRN   Swallow/Nutrition/ Hydration             ADL's   close sup for toilet/ TTB transfer, sit<>stand for LB dressing/LB bathing, mod I for seated UB dressing, standing grooming with ADL  sup to mod I  LUE NMR, pt/family edu, selfcare retraining, balance retraining, func transfer retraining   Mobility   supervision overall, close supervision gait due to decreased L foot clearance with ambulation  supervision overall  higher level balance and family edu   Communication             Safety/Cognition/ Behavioral Observations            Pain             Skin   MASD to groin  barrier cream as needed. assist patient with toileting to keep area dry.  assess skin qshift and PRN.     Discharge Planning:  Goal to return to MOD I level. Daughter and spouse avaliable 24/7 (spouse works OOT).   Team  Discussion: Left side coordination deficits post CVA. CBGs stable with less insulin than PTA. Migraines managed with medications and MASD to groin healing. Patient on target to meet rehab goals: yes, currently close Supervisio for trnsfers and mod I seated dressing with occasional loss of balance  *See Care Plan and progress notes for long and short-term goals.   Revisions to Treatment Plan:  Coordination and dynamic balance training Teaching Needs: Transfers, toileting, medications, etc  Current Barriers to Discharge: Decreased caregiver support  Possible Resolutions to Barriers: Family education      Medical Summary Current Status: CBGs well controlled on lower dose Novulina dn glipizide  Barriers to Discharge: Decreased family/caregiver support   Possible Resolutions to [employer] Focus: Will need close PCP f/u on CBGs, complete family training with therapy   Continued Need for Acute Rehabilitation Level of Care: The patient requires daily medical management by a physician with specialized training in physical medicine and rehabilitation for the following reasons: Direction of a multidisciplinary physical rehabilitation program to maximize functional independence : Yes Medical management of patient stability for increased activity during participation in an intensive rehabilitation regime.: Yes Analysis of laboratory values and/or radiology reports with any subsequent need for medication adjustment and/or medical intervention. : Yes   I attest that I was present, lead the team conference, and concur with the assessment and plan of  the team.   Dorien Chihuahua B 09/14/2020, 1:07 PM

## 2020-09-14 NOTE — Progress Notes (Addendum)
[place] PHYSICAL MEDICINE & REHABILITATION PROGRESS NOTE   Subjective/Complaints:  No issues overnite , discussed d/c in am , also discussed diet, was using Novulin N 25U BID at home, currently CBG controlled on 8U BID  ROS- denies CP, SOB, N/V/D, +fatigue  Objective:   No results found. Recent Labs    09/12/20 0524  WBC 7.1  HGB 10.3*  HCT 32.9*  PLT 212   Recent Labs    09/12/20 0524  NA 139  K 3.8  CL 106  CO2 24  GLUCOSE 174*  BUN 26*  CREATININE 1.11*  CALCIUM 9.4    Intake/Output Summary (Last 24 hours) at 09/14/2020 0831 Last data filed at 09/13/2020 1748 Gross per 24 hour  Intake 480 ml  Output --  Net 480 ml        Physical Exam: Vital Signs Blood pressure (!) 146/69, pulse 79, temperature 98 F (36.7 C), resp. rate 18, height 4' 7.98" (1.422 m), weight 78 kg, SpO2 94 %.   General: No acute distress Mood and affect are appropriate Heart: Regular rate and rhythm no rubs murmurs or extra sounds Lungs: Clear to auscultation, breathing unlabored, no rales or wheezes Abdomen: Positive bowel sounds, soft nontender to palpation, nondistended Extremities: No clubbing, cyanosis, or edema Skin: No evidence of breakdown, no evidence of rash   Neurologic: Cranial nerves II through XII intact, motor strength is 5/5 in bilateral deltoid, bicep, tricep, grip, hip flexor, knee extensors, ankle dorsiflexor and plantar flexor Sensory exam normal sensation to light touch and proprioception in bilateral upper and lower extremities Cerebellar exam some inccordination of LLE  Musculoskeletal: Full range of motion in all 4 extremities. No joint swelling    Assessment/Plan: 1. Functional deficits which require 3+ hours per day of interdisciplinary therapy in a comprehensive inpatient rehab setting.  Physiatrist is providing close team supervision and 24 hour management of active medical problems listed below.  Physiatrist and rehab team continue to assess  barriers to discharge/monitor patient progress toward functional and medical goals  Care Tool:  Bathing    Body parts bathed by patient: Right arm,Face,Left arm,Abdomen,Chest,Front perineal area,Buttocks,Right upper leg,Left upper leg,Right lower leg,Left lower leg         Bathing assist Assist Level: Supervision/Verbal cueing     Upper Body Dressing/Undressing Upper body dressing   What is the patient wearing?: Bra,Pull over shirt    Upper body assist Assist Level: Set up assist    Lower Body Dressing/Undressing Lower body dressing      What is the patient wearing?: Underwear/pull up,Pants     Lower body assist Assist for lower body dressing: Supervision/Verbal cueing     Toileting Toileting    Toileting assist Assist for toileting: Supervision/Verbal cueing     Transfers Chair/bed transfer  Transfers assist     Chair/bed transfer assist level: Supervision/Verbal cueing     Locomotion Ambulation   Ambulation assist      Assist level: Supervision/Verbal cueing Assistive device: Walker-rolling Max distance: room level distance   Walk 10 feet activity   Assist     Assist level: Contact Guard/Touching assist Assistive device: Walker-rolling   Walk 50 feet activity   Assist    Assist level: Contact Guard/Touching assist Assistive device: Walker-rolling    Walk 150 feet activity   Assist    Assist level: Contact Guard/Touching assist Assistive device: Walker-rolling    Walk 10 feet on uneven surface  activity   Assist     Assist level: Contact  Guard/Touching assist Assistive device: Aeronautical engineer Will patient use wheelchair at discharge?: No             Wheelchair 50 feet with 2 turns activity    Assist            Wheelchair 150 feet activity     Assist          Blood pressure (!) 146/69, pulse 79, temperature 98 F (36.7 C), resp. rate 18, height 4' 7.98" (1.422 m),  weight 78 kg, SpO2 94 %.  Medical Problem List and Plan: 1.  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  Antithrombotics: -DVT/anticoagulation:  Mechanical: Sequential compression devices, below knee Bilateral lower extremities             -antiplatelet therapy: N/a 3. Chronic pain due to migraines/Pain Management: Continue Stadol or Tylenol prn, pain is well controlled.  4. Mood: LCSW to follow for evaluation and support.              -antipsychotic agents: N/A 5. Neuropsych: This patient is capable of making decisions on her own behalf. 6. Skin/Wound Care: Routine pressure relief measures.  7. Fluids/Electrolytes/Nutrition: Monitor I/O. Electrolytes stable 12/8: monitor weekly.  8. HTN: Monitor BP tid--continue Norvasc daily.  Vitals:   09/14/20 0518 09/14/20 0754  BP: (!) 124/51 (!) 146/69  Pulse: 79   Resp: 18   Temp: 98 F (36.7 C)   SpO2: 94%    cont amlodipine 5mg    Some variablilty but would not increase amlodipine  9. T2DM with peripheral neuropathy: Hgb A1C-8.1. Will monitor BS ac/hs.( Home dose glucotrol 10mg  BID  and NPH insulin 25U BID.)  CBG (last 3)  Recent Labs    09/13/20 1618 09/13/20 2101 09/14/20 0559  GLUCAP 114* 183* 109*  cont glipizide 10mg  qam - as discussed with pt will need to f/u closely with PCP  Cont Novulin N 8U BID   10. Seizure disorder: Managed on home dose Phenobarbital. No seizure for 30+ years on meds 11.  CKD: Baseline SCr- has been around 1.6 per records. Now down to 1.3---> will monitor with serial checks. Avoid nephrotoxic medications.  12. ABLA: Hgb 10.2 on 12/8, repeat 12/13     LOS: 8 days A FACE TO [place] E Aldine Grainger 09/14/2020, 8:31 AM

## 2020-09-14 NOTE — Progress Notes (Signed)
Physical Therapy Discharge Summary  Patient Details  Name: Natalie [name] MRN: [id number] Date of Birth: 26-Oct-1950  Today's Date: 09/14/2020   Patient has met 12 of 12 long term goals due to improved activity tolerance, improved balance, improved postural control, increased strength, increased range of motion, decreased pain, ability to compensate for deficits, functional use of  left lower extremity and improved attention.  Patient to discharge at an ambulatory level Supervision.   Patient's care partner is independent to provide the necessary physical assistance at discharge.  Reasons goals not met: All goals met.  Recommendation:  Patient will benefit from ongoing skilled PT services in home health setting to continue to advance safe functional mobility, address ongoing impairments in gait, and minimize fall risk.  Equipment: rolling walker  Reasons for discharge: treatment goals met  Patient/family agrees with progress made and goals achieved: Yes  PT Discharge Vital Signs Therapy Vitals Temp: 97.8 F (36.6 C) Temp Source: Oral Pulse Rate: 75 Resp: 17 BP: 134/61 Patient Position (if appropriate): Sitting Oxygen Therapy SpO2: 98 % O2 Device: Room Air Pain Pain Assessment Pain Scale: 0-10 Pain Score: 5  Pain Location: Head Pain Frequency: Intermittent Pain Onset: Gradual Pain Intervention(s): Medication (See eMAR) Cognition Overall Cognitive Status: Within Functional Limits for tasks assessed Sensation Sensation Light Touch: Appears Intact Hot/Cold: Not tested Proprioception: Not tested Stereognosis: Not tested Additional Comments: Pt reports remaining L hand numbness ~2 days ago, otherwise sensation and propioception of BUE grossly WFL during functional tasks. Coordination Gross Motor Movements are Fluid and Coordinated: Yes Fine Motor Movements are Fluid and Coordinated: Yes Coordination and Movement Description: Grossly WFL during bimanual ADLs and func  mobility. Finger Nose Finger Test: [facility] Heel Shin Test: [facility] Motor  Motor Motor: Hemiplegia Motor - Discharge Observations: mild LLE hemiparesis, notably improved from evaluation  Mobility Bed Mobility Bed Mobility: Rolling Right;Rolling Left;Supine to Sit;Sit to Supine Rolling Right: Independent Rolling Left: Independent Supine to Sit: Independent Sit to Supine: Independent Transfers Transfers: Sit to Stand;Stand to Sit Sit to Stand: Independent with assistive device Stand to Sit: Independent with assistive device Stand Pivot Transfers: Supervision/Verbal cueing Stand Pivot Transfer Details: Visual cues for safe use of DME/AE;Visual cues/gestures for precautions/safety Transfer (Assistive device): Rolling walker Locomotion  Gait Ambulation: Yes Gait Assistance: Supervision/Verbal cueing Gait Distance (Feet): 300 Feet Assistive device: Rolling walker Gait Assistance Details: Verbal cues for safe use of DME/AE;Verbal cues for precautions/safety Gait Gait: Yes Gait Pattern: Impaired Gait Pattern: Step-through pattern;Decreased weight shift to right;Decreased dorsiflexion - left;Decreased hip/knee flexion - left Stairs / Additional Locomotion Stairs: Yes Stairs Assistance: Supervision/Verbal cueing Stair Management Technique: Two rails Number of Stairs: 12 Height of Stairs: 6 Ramp: Supervision/Verbal cueing Curb: Supervision/Verbal cueing Wheelchair Mobility Wheelchair Mobility: No  Trunk/Postural Assessment  Cervical Assessment Cervical Assessment: Within Functional Limits Thoracic Assessment Thoracic Assessment: Within Functional Limits Lumbar Assessment Lumbar Assessment: Within Functional Limits Postural Control Postural Control: Deficits on evaluation (req min A to maintain upright posture during min LOB when amb) Righting Reactions: delayed and inadequate Protective Responses: delayed and inadequate  Balance Balance Balance Assessed: Yes Static Sitting  Balance Static Sitting - Balance Support: Feet supported Static Sitting - Level of Assistance: 6: Modified independent (Device/Increase time) Dynamic Sitting Balance Dynamic Sitting - Balance Support: Feet supported Dynamic Sitting - Level of Assistance: 6: Modified independent (Device/Increase time) Static Standing Balance Static Standing - Balance Support: Bilateral upper extremity supported Static Standing - Level of Assistance: 6: Modified independent (Device/Increase time) Dynamic Standing Balance Dynamic  Standing - Balance Support: Bilateral upper extremity supported;During functional activity Dynamic Standing - Level of Assistance: 5: Stand by assistance Dynamic Standing - Balance Activities: [employer] Extremity Assessment  RLE Assessment RLE Assessment: Within Functional Limits LLE Assessment LLE Assessment: Exceptions to [facility] General Strength Comments: globally 4/5 and fatigues quickly    [employer] 09/14/2020, 5:25 PM

## 2020-09-14 NOTE — Progress Notes (Signed)
Patient ID: Natalie [name], female   DOB: 01/25/1951, 69 y.o.   MRN: [id number]   DME ordered through [facility].  [place], [place]

## 2020-09-14 NOTE — Progress Notes (Signed)
Team Conference Report to Patient/Family  Team Conference discussion was reviewed with the patient and caregiver, including goals, any changes in plan of care and target discharge date.  Patient and caregiver express understanding and are in agreement.  The patient has a target discharge date of 09/15/20.  Dyanne Iha 09/14/2020, 12:59 PM

## 2020-09-14 NOTE — Progress Notes (Signed)
Patient ID: Natalie [name], female   DOB: Apr 28, 1951, 69 y.o.   MRN: [id number]   Patient referral sent to [facility] for review for PT follow up.  [place], [place]

## 2020-09-14 NOTE — Progress Notes (Signed)
Physical Therapy Session Note  Patient Details  Name: SUMAIYAH MARKERT MRN: [id number] Date of Birth: [birth year]  Today's Date: 09/14/2020 PT Individual Time:  -      Short Term Goals: Week 1:  PT Short Term Goal 1 (Week 1): STG= LTG based on ELOS  Skilled Therapeutic Interventions/Progress Updates:    Pt received sitting in recliner with complaints of a severe headache. Pt states that she received medication but she is not willing to participate in therapy as she has already exhausted herself today. PT notified patient that she would check back at a later time, however when asked again 1 hour later, pt continued to refuse.  Missed 60 minutes of skilled physical therapy.   Therapy Documentation Precautions:  Precautions Precautions: Fall Restrictions Weight Bearing Restrictions: No Other Position/Activity Restrictions: SBP <140    Therapy/Group: Individual Therapy  Gaylord Shih 09/14/2020, 5:26 PM

## 2020-09-15 LAB — GLUCOSE, CAPILLARY: Glucose-Capillary: 161 mg/dL — ABNORMAL HIGH (ref 70–99)

## 2020-09-15 MED ORDER — INSULIN NPH (HUMAN) (ISOPHANE) 100 UNIT/ML ~~LOC~~ SUSP: 8.0000 [IU] | Freq: Two times a day (BID) | SUBCUTANEOUS | 11 refills | Status: DC

## 2020-09-15 MED ORDER — FREESTYLE LIBRE 14 DAY SENSOR MISC: 1.0000 "application " | Freq: Three times a day (TID) | 1 refills | Status: DC

## 2020-09-15 MED ORDER — GLIPIZIDE 10 MG PO TABS: 10.0000 mg | ORAL_TABLET | Freq: Every day | ORAL | Status: DC

## 2020-09-15 MED ORDER — FREESTYLE LIBRE READER DEVI: 1.0000 "application " | Freq: Three times a day (TID) | 0 refills | Status: DC

## 2020-09-15 MED ORDER — FREESTYLE LIBRE READER DEVI
1.0000 | Freq: Every day | 3 refills | Status: DC
Start: 2020-09-15 — End: 2020-10-26

## 2020-09-15 NOTE — Progress Notes (Signed)
Inpatient Rehabilitation Care Coordinator  Discharge Note  The overall goal for the admission was met for:   Discharge location: Yes, Home  Length of Stay: Yes, 9 Days  Discharge activity level: Yes, ambulatory level Supervision  Home/community participation: Yes  Services provided included: MD, RD, PT, OT, SLP, RN, CM, TR, Pharmacy, [employer]: Medicare  Follow-up services arranged: Home Health: [place]  Comments (or additional information): PT Rolling Walker   Patient/Family verbalized understanding of follow-up arrangements: Yes  Individual responsible for coordination of the follow-up plan: self, [phone]  Confirmed correct DME delivered: Dyanne Iha 09/15/2020    Dyanne Iha

## 2020-09-15 NOTE — Discharge Summary (Signed)
Physician Discharge Summary  Patient ID: [name] MRN: [id number] DOB/AGE: 71-Aug-1952 71 y.o.  Admit date: 09/06/2020 Discharge date: 09/15/2020  Discharge Diagnoses:  Principal Problem:   R corona radiata ICH (intracerebral hemorrhage) ([place]) w/ IVH d/t HTN Active Problems:   Diabetes mellitus, type II (HCC)   HTN (hypertension)   Migraines   Anemia, iron deficiency   Chronic kidney disease   Acute renal failure superimposed on stage 3 chronic kidney disease (HCC)   Discharged Condition: stable   Significant Diagnostic Studies: N/A   Labs:  Basic Metabolic Panel: BMP Latest Ref Rng & Units 09/12/2020 09/07/2020 09/06/2020  Glucose 70 - 99 mg/dL 174(H) 156(H) 145(H)  BUN 8 - 23 mg/dL 26(H) 25(H) 25(H)  Creatinine 0.44 - 1.00 mg/dL 1.11(H) 1.21(H) 1.31(H)  BUN/Creat Ratio 12 - 28 - - -  Sodium 135 - 145 mmol/L 139 140 141  Potassium 3.5 - 5.1 mmol/L 3.8 3.6 4.1  Chloride 98 - 111 mmol/L 106 105 106  CO2 22 - 32 mmol/L 24 25 24   Calcium 8.9 - 10.3 mg/dL 9.4 9.5 9.6    CBC: CBC Latest Ref Rng & Units 09/12/2020 09/07/2020 09/06/2020  WBC 4.0 - 10.5 K/uL 7.1 5.8 5.9  Hemoglobin 12.0 - 15.0 g/dL 10.3(L) 10.2(L) 10.4(L)  Hematocrit 36.0 - 46.0 % 32.9(L) 31.3(L) 33.4(L)  Platelets 150 - 400 K/uL 212 205 224    CBG: Recent Labs  Lab 09/14/20 0559 09/14/20 1205 09/14/20 1639 09/14/20 2128 09/15/20 0538  GLUCAP 109* 105* 149* 161* 161*    Brief HPI:   Natalie [name] is a 69 y.o. female with history of T2DM with neuropathy, CKD 3, chronic pain due to cervicalgia and migraines, seizure disorder who was admitted on 09/02/2020 with acute onset of left-sided weakness with left facial droop and mild slurred speech.  CT head done showing 2 cc acute hematoma right corona radiata with early extension right lateral ventricle.  MRI brain done showing IPH without underlying lesion, few chronic microhemorrhages as well as mild to moderate chronic ischemic changes.  CTA head/neck was  negative for LVO, aneurysm or occlusion.  Bleed was felt to be hypertensive in nature and systolic blood pressure goals recommended < 140.  Therapy evaluations were done revealing balance deficits with left-sided weakness affecting ADLs and mobility.  CIR was recommended due to functional decline.   Hospital Course: ENJOLI TIDD was admitted to rehab 09/06/2020 for inpatient therapies to consist of PT and OT at least three hours five days a week. Past admission physiatrist, therapy team and rehab RN have worked together to provide customized collaborative inpatient rehab. Her blood pressures have been monitored on 3 times daily basis and also is showing improvement in control.  Her diabetes has been monitored with ac/hs cbg checks. She has had issues with hypoglycemia requiring frequent adjustment in NPH insulin. Currently BS are controlled on glucotrol 10 mg daily in am and NPH 8 units bid.    Serial check of electrolytes showed acute on chronic renal failure and she was encouraged to increase fluid intake.  Serum creatinine has improved to 1.1 however continues to have elevated BUN and was encouraged to continue with increasing fluid intake.  Serial check of CBC showed drop in H&H which has been stable.  Recommend follow-up CBC in a couple weeks to monitor for stability. She has had issues with intermittent migraines which have been controlled with as needed use of Stadol.  Her mood has been stable and she has made  steady progress during her rehab stay.  She has progressed to supervision level and will continue to receive follow-up HHPT by [facility] after discharge.    Rehab course: During patient's stay in rehab weekly team conferences were held to monitor patient's progress, set goals and discuss barriers to discharge. At admission, patient required min assist with mobility and supervision with ADL tasks. She  has had improvement in activity tolerance, balance, postural control as well as  ability to compensate for deficits.  She has had improvement in functional use   LLE as well as improvement in awareness.  She is able to complete ADL tasks with supervision.  She requires supervision with cues for transfers and for ambulating 300 feet with rolling walker.   Disposition:  Home   Diet: Heart Healthy/carb modified.   Special Instructions: 1. No driving till cleared by MD. 2. Recommend repeat CBC/BMET in 1-2 weeks to monitor H&H as well as renal status.   Discharge Instructions    Ambulatory referral to Physical Medicine Rehab   Complete by: As directed    1-2 weeks TC appt     Allergies as of 09/15/2020      Reactions   Compazine Other (See Comments)   Bad headache, vomiting; IV    Cymbalta [duloxetine Hcl] Other (See Comments)   "Kept me awake"   Methadone Hives   Escitalopram Anxiety   Macrobid [nitrofurantoin Monohyd Macro] Rash      Medication List    TAKE these medications   albuterol 108 (90 Base) MCG/ACT inhaler Commonly known as: VENTOLIN HFA Inhale 2 puffs into the lungs every 4 (four) hours as needed for wheezing or shortness of breath.   amLODipine 5 MG tablet Commonly known as: NORVASC Take 1 tablet (5 mg total) by mouth daily. What changed: when to take this   atorvastatin 20 MG tablet Commonly known as: LIPITOR Take 1 tablet (20 mg total) by mouth daily at 6 PM.   butorphanol 10 MG/ML nasal spray Commonly known as: STADOL PLACE 1 SPRAY INTO THE NOSE EVERY 4 HOURS AS NEEDED FOR MIGRAINE. What changed:   how much to take  how to take this  when to take this  reasons to take this   FreeStyle Libre 14 Day Sensor Misc 1 application by Does not apply route 4 (four) times daily -  before meals and at bedtime.   FreeStyle Libre 14 Day Sensor Misc 1 application by Does not apply route 4 (four) times daily -  before meals and at bedtime.   FreeStyle Libre Reader Devi 1 Device by Does not apply route daily. Patient has uncontrolled  CBGs despite checking blood sugars 4 times per day and taking insulin 4 times per day. She would prefer freestyle libre 3. She has not used continuous blood glucose monitor in the past   FreeStyle Libre Reader Devi 1 application by Does not apply route 4 (four) times daily -  before meals and at bedtime.   glipiZIDE 10 MG tablet Commonly known as: GLUCOTROL Take 1 tablet (10 mg total) by mouth daily before breakfast.   insulin aspart 100 UNIT/ML injection Commonly known as: novoLOG Inject 0-9 Units into the skin 3 (three) times daily with meals.   insulin NPH Human 100 UNIT/ML injection Commonly known as: NOVOLIN N Inject 0.08 mLs (8 Units total) into the skin 2 (two) times daily at 8 am and 10 pm. What changed: how much to take   levothyroxine 75 MCG tablet Commonly  known as: SYNTHROID Take 1 tablet (75 mcg total) by mouth daily.   PHENobarbital 97.2 MG tablet Commonly known as: LUMINAL TAKE 2 TABLETS BY MOUTH ONCE DAILY ON MON,WED,AND FRI AND 1 TABLET DAILY ON OTHER DAYS What changed:   how much to take  how to take this  when to take this  additional instructions   senna-docusate 8.6-50 MG tablet Commonly known as: Senokot-S Take 1 tablet by mouth 2 (two) times daily.   tiZANidine 4 MG tablet Commonly known as: ZANAFLEX TAKE 1 TABLET BY MOUTH EVERY 4-6 HOURS AS NEEDED FOR MUSCLE SPASM What changed: See the new instructions.   zolpidem 10 MG tablet Commonly known as: AMBIEN take 1 tablet by mouth at bedtime for sleep if needed. What changed:   how much to take  how to take this  when to take this  reasons to take this       Follow-up Information    Kirsteins, Luanna Salk, MD Follow up.   Specialty: Physical Medicine and Rehabilitation Contact information: [place] [address] [phone]        [facility]. Call.   Why: for stroke follow up Contact information: [address]     [place] [address] [phone]       Buzzy Han, MD. Call.   Specialty: Family Medicine Why: for post hospital followup Contact information: [place] [address] [phone]               Signed: Bary Leriche 09/17/2020, 11:33 PM

## 2020-09-15 NOTE — Progress Notes (Signed)
Patient discharged to home, accompanied by her husband. 

## 2020-09-15 NOTE — Discharge Instructions (Signed)
Inpatient Rehab Discharge Instructions  Natalie [name] Discharge date and time: 09/15/20   Activities/Precautions/ Functional Status: Activity: no lifting, driving, or strenuous exercise for till cleared by MD Diet: diabetic diet Wound Care: none needed   Functional status:  ___ No restrictions     ___ Walk up steps independently _X__ 24/7 supervision/assistance   ___ Walk up steps with assistance ___ Intermittent supervision/assistance  ___ Bathe/dress independently ___ Walk with walker     ___ Bathe/dress with assistance ___ Walk Independently    ___ Shower independently ___ Walk with assistance    _X__ Shower with assistance _X__ No alcohol     ___ Return to work/school ________  COMMUNITY REFERRALS UPON DISCHARGE:    Home Health:   PT                  Agency: [facility] Phone: [phone]   Medical Equipment/Items Ordered: Rolling Walker                                                 Agency/Supplier: [employer]    Special Instructions: 1. Monitor blood sugars before meals and at bedtime. Follow up with PCP for input on glucotrol if blood sugars are running over 120 consistently.    STROKE/TIA DISCHARGE INSTRUCTIONS SMOKING Cigarette smoking nearly doubles your risk of having a stroke & is the single most alterable risk factor  If you smoke or have smoked in the last 12 months, you are advised to quit smoking for your health.  Most of the excess cardiovascular risk related to smoking disappears within a year of stopping.  Ask you doctor about anti-smoking medications  [place] Quit Line: 1-800-QUIT NOW  Free Smoking Cessation Classes [phone]  CHOLESTEROL Know your levels; limit fat & cholesterol in your diet  Lipid Panel     Component Value Date/Time   CHOL 154 09/02/2020 1029   CHOL 159 10/08/2018 1504   TRIG 107 09/02/2020 1029   HDL 64 09/02/2020 1029   HDL 75 10/08/2018 1504   CHOLHDL 2.4 09/02/2020 1029   VLDL 21 09/02/2020 1029    LDLCALC 69 09/02/2020 1029   LDLCALC 65 10/08/2018 1504      Many patients benefit from treatment even if their cholesterol is at goal.  Goal: Total Cholesterol (CHOL) less than 160  Goal:  Triglycerides (TRIG) less than 150  Goal:  HDL greater than 40  Goal:  LDL (LDLCALC) less than 100   BLOOD PRESSURE American Stroke Association blood pressure target is less that 120/80 mm/Hg  Your discharge blood pressure is:  BP: 134/61  Monitor your blood pressure  Limit your salt and alcohol intake  Many individuals will require more than one medication for high blood pressure  DIABETES (A1c is a blood sugar average for last 3 months) Goal HGBA1c is under 7% (HBGA1c is blood sugar average for last 3 months)  Diabetes:     Lab Results  Component Value Date   HGBA1C 8.1 (H) 09/02/2020     Your HGBA1c can be lowered with medications, healthy diet, and exercise.  Check your blood sugar as directed by your physician  Call your physician if you experience unexplained or low blood sugars.  PHYSICAL ACTIVITY/REHABILITATION Goal is 30 minutes at least 4 days per week  Activity: No driving, Therapies: See above Return to  work: N/A  Activity decreases your risk of heart attack and stroke and makes your heart stronger.  It helps control your weight and blood pressure; helps you relax and can improve your mood.  Participate in a regular exercise program.  Talk with your doctor about the best form of exercise for you (dancing, walking, swimming, cycling).  DIET/WEIGHT Goal is to maintain a healthy weight  Your discharge diet is:  Diet Order            Diet heart healthy/carb modified Room service appropriate? Yes; Fluid consistency: Thin  Diet effective now                liquids Your height is:  Height: 4' 7.98" (142.2 cm) Your current weight is: Weight: 78 kg Your Body Mass Index (BMI) is:  BMI (Calculated): 38.57  Following the type of diet specifically designed for you will help  prevent another stroke.  Your goal weight   Your goal Body Mass Index (BMI) is 19-24.  Healthy food habits can help reduce 3 risk factors for stroke:  High cholesterol, hypertension, and excess weight.  RESOURCES Stroke/Support Group:  Call [phone]   STROKE EDUCATION PROVIDED/REVIEWED AND GIVEN TO PATIENT Stroke warning signs and symptoms How to activate emergency medical system (call 911). Medications prescribed at discharge. Need for follow-up after discharge. Personal risk factors for stroke. Pneumonia vaccine given:  Flu vaccine given:  My questions have been answered, the writing is legible, and I understand these instructions.  I will adhere to these goals & educational materials that have been provided to me after my discharge from the hospital.    My questions have been answered and I understand these instructions. I will adhere to these goals and the provided educational materials after my discharge from the hospital.  Patient/Caregiver Signature _______________________________ Date __________  Clinician Signature _______________________________________ Date __________  Please bring this form and your medication list with you to all your follow-up doctor's appointments.

## 2020-09-15 NOTE — Progress Notes (Signed)
[facility] PHYSICAL MEDICINE & REHABILITATION PROGRESS NOTE   Subjective/Complaints: No complaints this morning She would like a freestyle libre device to be able to check her CBGs more frequently. I will add this order to her discharge orders.  ROS- denies CP, SOB, N/V/D, +fatigue  Objective:   No results found. No results for input(s): WBC, HGB, HCT, PLT in the last 72 hours. No results for input(s): NA, K, CL, CO2, GLUCOSE, BUN, CREATININE, CALCIUM in the last 72 hours.  Intake/Output Summary (Last 24 hours) at 09/15/2020 0944 Last data filed at 09/15/2020 2409 Gross per 24 hour  Intake 360 ml  Output --  Net 360 ml        Physical Exam: Vital Signs Blood pressure 139/66, pulse 79, temperature 98.2 F (36.8 C), resp. rate 16, height 4' 7.98" (1.422 m), weight 78 kg, SpO2 96 %. Gen: no distress, normal appearing HEENT: oral mucosa pink and moist, NCAT Cardio: Reg rate Chest: normal effort, normal rate of breathing Abd: soft, non-distended Ext: no edema Skin: intact Neurologic: Cranial nerves II through XII intact, motor strength is 5/5 in bilateral deltoid, bicep, tricep, grip, hip flexor, knee extensors, ankle dorsiflexor and plantar flexor Sensory exam normal sensation to light touch and proprioception in bilateral upper and lower extremities Cerebellar exam some inccordination of LLE  Musculoskeletal: Full range of motion in all 4 extremities. No joint swelling    Assessment/Plan: 1. Functional deficits which require 3+ hours per day of interdisciplinary therapy in a comprehensive inpatient rehab setting.  Physiatrist is providing close team supervision and 24 hour management of active medical problems listed below.  Physiatrist and rehab team continue to assess barriers to discharge/monitor patient progress toward functional and medical goals  Care Tool:  Bathing    Body parts bathed by patient: Right arm,Face,Left arm,Abdomen,Chest,Front perineal  area,Buttocks,Right upper leg,Left upper leg,Right lower leg,Left lower leg         Bathing assist Assist Level: Supervision/Verbal cueing     Upper Body Dressing/Undressing Upper body dressing   What is the patient wearing?: Bra,Pull over shirt    Upper body assist Assist Level: Set up assist    Lower Body Dressing/Undressing Lower body dressing      What is the patient wearing?: Underwear/pull up,Pants     Lower body assist Assist for lower body dressing: Supervision/Verbal cueing     Toileting Toileting    Toileting assist Assist for toileting: Supervision/Verbal cueing     Transfers Chair/bed transfer  Transfers assist     Chair/bed transfer assist level: Supervision/Verbal cueing     Locomotion Ambulation   Ambulation assist      Assist level: Supervision/Verbal cueing Assistive device: Walker-rolling Max distance: 310ft   Walk 10 feet activity   Assist     Assist level: Supervision/Verbal cueing Assistive device: Walker-rolling   Walk 50 feet activity   Assist    Assist level: Supervision/Verbal cueing Assistive device: Walker-rolling    Walk 150 feet activity   Assist    Assist level: Supervision/Verbal cueing Assistive device: Walker-rolling    Walk 10 feet on uneven surface  activity   Assist     Assist level: Supervision/Verbal cueing Assistive device: Walker-rolling   Wheelchair     Assist Will patient use wheelchair at discharge?: No             Wheelchair 50 feet with 2 turns activity    Assist            Wheelchair 150 feet  activity     Assist          Blood pressure 139/66, pulse 79, temperature 98.2 F (36.8 C), resp. rate 16, height 4' 7.98" (1.422 m), weight 78 kg, SpO2 96 %.  Medical Problem List and Plan: 1.  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  Antithrombotics: -DVT/anticoagulation:  Mechanical: Sequential compression devices, below knee Bilateral lower extremities             -antiplatelet therapy: N/a 3. Chronic pain due to migraines/Pain Management: Continue Stadol or Tylenol prn, pain is well controlled.  4. Mood: LCSW to follow for evaluation and support.              -antipsychotic agents: N/A 5. Neuropsych: This patient is capable of making decisions on her own behalf. 6. Skin/Wound Care: Routine pressure relief measures.  7. Fluids/Electrolytes/Nutrition: Monitor I/O. Electrolytes stable 12/8: monitor weekly.  8. HTN: Monitor BP tid--continue Norvasc daily.  Vitals:   09/14/20 2012 09/15/20 0537  BP: (!) 167/79 139/66  Pulse: 75 79  Resp: 18 16  Temp: 97.7 F (36.5 C) 98.2 F (36.8 C)  SpO2: 100% 96%   cont amlodipine 5mg    BP labile: continue Norvasc for now.  9. T2DM with peripheral neuropathy: Hgb A1C-8.1. Will monitor BS ac/hs.( Home dose glucotrol 10mg  BID  and NPH insulin 25U BID.)  CBG (last 3)  Recent Labs    09/14/20 1639 09/14/20 2128 09/15/20 0538  GLUCAP 149* 161* 161*  cont glipizide 10mg  qam - as discussed with pt will need to f/u closely with PCP  Cont Novulin N 8U BID   Will place order for freestyle libre device so patient can check CBGs more frequently and achieve better control.  10. Seizure disorder: Conitnue home dose Phenobarbital. No seizure for 30+ years on meds 11.  CKD: Baseline SCr- has been around 1.6 per records. Now down to 1.3---> will monitor with serial checks. Avoid nephrotoxic medications.  12. ABLA: Hgb 10.2 on 12/8, repeat 12/13   >30 minutes spent in discharge of patient including review of medications and follow-up appointments, physical examination, reviewing labs, ordering freestyle libre device, and in answering all patient's questions     LOS: 9 days A FACE TO FACE EVALUATION WAS  PERFORMED  Clide Deutscher Harleyquinn Gasser 09/15/2020, 9:44 AM

## 2020-09-15 NOTE — Progress Notes (Signed)
Patient ID: Natalie [name], female   DOB: Aug 23, 1951, 69 y.o.   MRN: [id number]   Handicapped placement card form provided to patient before discharge

## 2020-09-20 ENCOUNTER — Telehealth: Payer: Self-pay | Admitting: *Deleted

## 2020-09-20 NOTE — Telephone Encounter (Signed)
[facility] called for PT  POC 1wk3, 2wk5,1wk2.  Approval given.

## 2020-09-21 ENCOUNTER — Telehealth: Payer: Self-pay

## 2020-09-21 NOTE — Telephone Encounter (Signed)
Natalie [name] called requesting verbal orders for HHPT 1wk3, 2wk5, 1wk2. Called back and talked to [facility] letting her know that this order was approved by Golda Acre given to Natalie [name].

## 2020-10-05 ENCOUNTER — Other Ambulatory Visit: Payer: Self-pay

## 2020-10-05 ENCOUNTER — Encounter: Payer: Medicare Other | Attending: Registered Nurse | Admitting: Registered Nurse

## 2020-10-05 VITALS — BP 116/55 | HR 81 | Temp 99.0°F | Ht <= 58 in | Wt 172.8 lb

## 2020-10-05 DIAGNOSIS — I10 Essential (primary) hypertension: Secondary | ICD-10-CM | POA: Diagnosis present

## 2020-10-05 NOTE — Progress Notes (Signed)
Subjective:    Patient ID: Natalie [name], female    DOB: May 28, 1951, 70 y.o.   MRN: [id number]  HPI: Natalie [name] is a 70 y.o. female who is here for HFU appointment following up on her Right Corona radiata ICH, Essential Hypertension  And Type @ Diabetes Mellitus. She presented to [facility] ED on 09/02/2020 with complaints of generalized weakness, left sided weakness and left facial droop. Neurology consulted.  CT Head WO Contrast: CT Cervical Spine WO Contrast:  IMPRESSION: 1. 2 cc acute hematoma in the right corona radiata. There is early extension into the right lateral ventricle. 2. Background chronic small vessel disease. 3. No acute finding in the cervical spine.  MR Brain WO Contrast:  IMPRESSION: Acute parenchymal hemorrhage centered in the right corona radiata with mild surrounding edema and trace intraventricular extension. No evidence of underlying lesion.  Natalie [name] was admitted to inpatient Rehabilitation on 09/06/2020 and discharged home on 09/15/2020. She is receiving Home Health Therapy with [facility]. She states she has left lower extremity with numbness. She rates her pain 7. Also reports she has a good appetite.   Natalie [name] asked about driving , she was evaluated with this provider and Dr Ranell Patrick. Dr Ranell Patrick gave Natalie [name] driving directions. Natalie [name] understanding.    Pain Inventory Average Pain 7 Pain Right Now 7 My pain is dull and tingling  LOCATION OF PAIN  Leg, toes  BOWEL Number of stools per week: 5 Oral laxative use No  Type of laxative none Enema or suppository use No  History of colostomy No  Incontinent No   BLADDER Normal In and out cath, frequency na Able to self cath na Bladder incontinence No  Frequent urination No  Leakage with coughing No  Difficulty starting stream No  Incomplete bladder emptying No    Mobility walk without assistance how many minutes can you walk? 20 transfers  alone  Function retired  Neuro/Psych numbness tingling  Prior Studies HFU  Physicians involved in your care HFU   Family History  Problem Relation Age of Onset  . Heart disease Father   . Diabetes Father    Social History   Socioeconomic History  . Marital status: Married    Spouse name: Not on file  . Number of children: 2  . Years of education: 2 years of college  . Highest education level: Not on file  Occupational History  . Not on file  Tobacco Use  . Smoking status: Never Smoker  . Smokeless tobacco: Never Used  Vaping Use  . Vaping Use: Never used  Substance and Sexual Activity  . Alcohol use: No    Alcohol/week: 0.0 standard drinks  . Drug use: No  . Sexual activity: Not on file  Other Topics Concern  . Not on file  Social History Narrative  . Not on file   Social Determinants of Health   Financial Resource Strain: Not on file  Food Insecurity: Not on file  Transportation Needs: Not on file  Physical Activity: Not on file  Stress: Not on file  Social Connections: Not on file   Past Surgical History:  Procedure Laterality Date  . CESAREAN SECTION  1974  . CHOLECYSTECTOMY OPEN  2002  . CYSTO/  LEFT RETROGRADE PYELOGRAM  06-11-2006   dr Jeffie Pollock  [facility]  . CYSTOSCOPY WITH RETROGRADE PYELOGRAM, URETEROSCOPY AND STENT PLACEMENT Left 04/10/2018   Procedure: CYSTOSCOPY WITH RETROGRADE PYELOGRAM, URETEROSCOPY AND STENT PLACEMENT;  Surgeon: Diona Fanti,  Annie Main, MD;  Location: [facility];  Service: Urology;  Laterality: Left;  . FOOT ARTHRODESIS Right 09/10/2019   Procedure: ARTHRODESIS FOOT;  Surgeon: Erle Crocker, MD;  Location: [place];  Service: Orthopedics;  Laterality: Right;  . IR URETERAL STENT PLACEMENT EXISTING ACCESS RIGHT  08-21-2007    dr Karsten Ro  [facility]  . LEFT RETROGRADE PYELOGRAM/ LEFT URETERAL STENT PLACEMENT  02-25-2001   dr Jeffie Pollock [facility]  . NEPHROLITHOTOMY Right 05/14/2016   Procedure: NEPHROLITHOTOMY  PERCUTANEOUS;  Surgeon: Franchot Gallo, MD;  Location: [facility];  Service: Urology;  Laterality: Right;  . OPEN REDUCTION INTERNAL FIXATION (ORIF) FOOT LISFRANC FRACTURE Right 09/10/2019   Procedure: OPEN REDUCTION INTERNAL FIXATION (ORIF) FOOT LISFRANC FRACTURE;  Surgeon: Erle Crocker, MD;  Location: [place];  Service: Orthopedics;  Laterality: Right;  . REPAIR EXTENSOR TENDON WITH METATARSAL OSTEOTOMY AND OPEN REDUCTION IN Right 09/10/2019   Procedure: OPEN TREATMENT RIGHT FIRST, SECOND, THIRD TARSOMETATARSAL LISFRANC, MIDFOOT FUSION;  Surgeon: Erle Crocker, MD;  Location: [place];  Service: Orthopedics;  Laterality: Right;  . SHOULDER ARTHROSCOPY Bilateral left 11-13-2000;  right 02-18-2018   Past Medical History:  Diagnosis Date  . Arthritis   . Atrophy of left kidney   . Cervicalgia   . Chronic kidney disease, stage 3 ([place])    acute aki 03-14-2018 in setting pyelonehritis-- resolved  . Chronic migraine   . Chronic pain syndrome   . History of kidney stones   . History of pyelonephritis    hx recurrent  . History of recurrent UTIs   . History of sepsis    07/ 2017 secondary to pyelonephritis  . Hypertension   . Hypothyroidism   . Iron deficiency anemia   . Left ureteral stone   . Mixed hyperlipidemia   . Nephrolithiasis    right side nonobstructive per CT 03-14-2018  . Right shoulder pain    post sx 02-18-2018  . Seizure disorder ([place]) followed by pcp   per pt dx age 68--  no seizure since 1980's , controlled w/ phenobarbitol  . Type 2 diabetes mellitus ([place])    followed by pcp  . Wears glasses    BP (!) 116/55   Pulse 81   Temp 99 F (37.2 C)   Ht 4\' 9"  (1.448 m)   Wt 172 lb 12.8 oz (78.4 kg)   SpO2 96%   BMI 37.39 kg/m   Opioid Risk Score:   Fall Risk Score:  `1  Depression screen PHQ 2/9  Depression screen [facility] 2/9 12/01/2018 08/07/2018 05/05/2018 03/19/2018 03/03/2018 02/08/2018 11/23/2017  Decreased Interest 0 0 0  0 0 0 0  Down, Depressed, Hopeless 0 0 0 0 0 0 0  PHQ - 2 Score 0 0 0 0 0 0 0  Some recent data might be hidden    Review of Systems     Objective:   Physical Exam Vitals and nursing note reviewed.  Constitutional:      Appearance: Normal appearance.  Cardiovascular:     Rate and Rhythm: Normal rate and regular rhythm.     Pulses: Normal pulses.     Heart sounds: Normal heart sounds.  Pulmonary:     Effort: Pulmonary effort is normal.     Breath sounds: Normal breath sounds.  Musculoskeletal:     Cervical back: Normal range of motion and neck supple.     Comments: Normal Muscle Bulk and Muscle Testing Reveals:  Upper Extremities: Full  ROM and Muscle Strength 5/5  Lower Extremities: Full ROM and Muscle Strength 5/5 Arises from chair with ease using walker for support Narrow Based Gait   Skin:    General: Skin is warm and dry.  Neurological:     Mental Status: She is alert and oriented to person, place, and time.  Psychiatric:        Mood and Affect: Mood normal.        Behavior: Behavior normal.           Assessment & Plan:   1. Right Corona radiata ICH: Continue Home Health Therapy with [facility]. She has a scheduled appointment with Neurology. Continue to Monitor.  2.  Essential Hypertension: Continue current medication regimen. PCP Following. Continue to Monitor.  3.Type @ Diabetes Mellitus. Continue current medication regimen. PCP following. Continue to Monitor.   F/U with Dr Letta Pate in 4- 6 weeks

## 2020-10-09 ENCOUNTER — Encounter: Payer: Self-pay | Admitting: Registered Nurse

## 2020-10-11 ENCOUNTER — Telehealth: Payer: Self-pay | Admitting: Emergency Medicine

## 2020-10-11 NOTE — Telephone Encounter (Signed)
Pre-Op Risk Assessment faxed to [facility].  OK transmission received.

## 2020-10-18 ENCOUNTER — Encounter: Payer: Self-pay | Admitting: *Deleted

## 2020-10-19 ENCOUNTER — Inpatient Hospital Stay: Payer: Medicare Other | Admitting: Adult Health

## 2020-10-25 DIAGNOSIS — I129 Hypertensive chronic kidney disease with stage 1 through stage 4 chronic kidney disease, or unspecified chronic kidney disease: Secondary | ICD-10-CM

## 2020-10-25 DIAGNOSIS — N179 Acute kidney failure, unspecified: Secondary | ICD-10-CM

## 2020-10-25 DIAGNOSIS — G894 Chronic pain syndrome: Secondary | ICD-10-CM | POA: Diagnosis not present

## 2020-10-25 DIAGNOSIS — Z9049 Acquired absence of other specified parts of digestive tract: Secondary | ICD-10-CM

## 2020-10-25 DIAGNOSIS — E1122 Type 2 diabetes mellitus with diabetic chronic kidney disease: Secondary | ICD-10-CM

## 2020-10-25 DIAGNOSIS — M542 Cervicalgia: Secondary | ICD-10-CM

## 2020-10-25 DIAGNOSIS — D500 Iron deficiency anemia secondary to blood loss (chronic): Secondary | ICD-10-CM

## 2020-10-25 DIAGNOSIS — I69154 Hemiplegia and hemiparesis following nontraumatic intracerebral hemorrhage affecting left non-dominant side: Secondary | ICD-10-CM | POA: Diagnosis not present

## 2020-10-25 DIAGNOSIS — E782 Mixed hyperlipidemia: Secondary | ICD-10-CM

## 2020-10-25 DIAGNOSIS — G4700 Insomnia, unspecified: Secondary | ICD-10-CM

## 2020-10-25 DIAGNOSIS — N261 Atrophy of kidney (terminal): Secondary | ICD-10-CM

## 2020-10-25 DIAGNOSIS — N183 Chronic kidney disease, stage 3 unspecified: Secondary | ICD-10-CM

## 2020-10-25 DIAGNOSIS — G40909 Epilepsy, unspecified, not intractable, without status epilepticus: Secondary | ICD-10-CM

## 2020-10-25 DIAGNOSIS — E039 Hypothyroidism, unspecified: Secondary | ICD-10-CM

## 2020-10-25 DIAGNOSIS — G43709 Chronic migraine without aura, not intractable, without status migrainosus: Secondary | ICD-10-CM | POA: Diagnosis not present

## 2020-10-25 DIAGNOSIS — Z87442 Personal history of urinary calculi: Secondary | ICD-10-CM

## 2020-10-25 DIAGNOSIS — E1142 Type 2 diabetes mellitus with diabetic polyneuropathy: Secondary | ICD-10-CM

## 2020-10-25 DIAGNOSIS — Z87440 Personal history of urinary (tract) infections: Secondary | ICD-10-CM

## 2020-10-25 DIAGNOSIS — Z8781 Personal history of (healed) traumatic fracture: Secondary | ICD-10-CM

## 2020-10-26 ENCOUNTER — Other Ambulatory Visit: Payer: Self-pay

## 2020-10-26 ENCOUNTER — Ambulatory Visit (INDEPENDENT_AMBULATORY_CARE_PROVIDER_SITE_OTHER): Payer: Medicare Other | Admitting: Adult Health

## 2020-10-26 ENCOUNTER — Encounter: Payer: Self-pay | Admitting: Adult Health

## 2020-10-26 VITALS — BP 158/78 | HR 78 | Ht <= 58 in | Wt 174.6 lb

## 2020-10-26 DIAGNOSIS — I619 Nontraumatic intracerebral hemorrhage, unspecified: Secondary | ICD-10-CM

## 2020-10-26 NOTE — Progress Notes (Signed)
[facility] [address] [place]. [place] [address] [phone]       HOSPITAL FOLLOW UP NOTE  Ms. NEDA STUTHEIT Date of Birth:  [birth year] Medical Record Number:  [id number]   Reason for Referral:  hospital stroke follow up    SUBJECTIVE:   CHIEF COMPLAINT:  Chief Complaint  Patient presents with  . Follow-up    Treatment room alone  Pt is well, L foot numbness     HPI:   Ms.Yentl B Hayesis a 70 y.o.femalewith history of chronic headaches chronic migraine, chronic numbness BLE, chronic pain syndrome, CKD, hypertension, hyperlipidemia, type 2 diabetes,and seizures since the age of 69,who presented to [facility] ED on 09/02/2020 withleft sided weakness and mild headache.Personally reviewed hospitalization pertinent progress notes, lab work and imaging with summary provided. Evaluated by Dr. Leonie Man with stroke work-up revealing acute right corona radiata IPH with mild surrounding edema and trace IVH, etiology likely hypertensive. History of HTN with long-term BP goal normotensive range. History of HLD on atorvastatin 40 mg daily with LDL 69 therefore decrease dosage to 20 mg daily in setting of recent IPH. History of DM with A1c 8.1 on insulin and glipizide. Other stroke risk factors include advanced age, obesity, migraines and prior strokes by imaging. Other active problems include CKD and seizure disorder on phenobarbital. Evaluated by therapies and discharged to CIR on 09/06/2020 for ongoing therapy needs and residual left-sided weakness and balance impairment.   Stroke: acuteRcorona radiataIPHwith mild surrounding edema and traceIVH-etiology likely hypertensive  CT head- 2 cc acute hematoma in the right corona radiata. There is early extension into the right lateral ventricle. Background chronic small vessel disease.   CT head repeat- Unchanged appearance of right basal ganglia intraparenchymal hemorrhage.   MRI head- Acute R corona radiata IPH with  mild surrounding edema and trace IVH. No evidence of underlying lesion. Few chronic microhemorrhages. Mild to moderate chronic microvascular ischemic changes. Chronic small vessel infarcts  CTA H&N -no large vessel stenosis or occlusion no aneurysm or AVM.  2D Echo -EF60-65%. No source of embolus  Lacey Jensen Virus 2 - negative  LDL - 69  HgbA1c- 8.1  UDS -(not collected)  VTE prophylaxis - SCDs  No antithromboticprior to admission, now on No antithromboticICH  Therapy recommendations:CIR  Disposition: CIR   Today, 10/26/2020, Ms. Mccraney is being seen for hospital follow-up unaccompanied. She was discharged home from CIR on 09/15/2020 with recommendation of [facility] PT. She reports residual left foot numbness and gait impairment. She also reports right foot fracture s/p surgical procedure 08/2020 with planned hardware removal tomorrow but due to recent stroke, procedure delayed.  She believes this is also be contributing to her gait impairment.  Denies new or worsening stroke/TIA symptoms. She remains on atorvastatin 20 mg daily without myalgias. Blood pressure today 158/78 - monitors at home which typically stable . Glucose levels stable at home. Reports repeat A1c with PCP which was at 7.9.  No concerns at this time.     ROS:   14 system review of systems performed and negative with exception of those listed in HPI  PMH:  Past Medical History:  Diagnosis Date  . Arthritis   . Atrophy of left kidney   . Cervicalgia   . Chronic kidney disease, stage 3 ([place])    acute aki 03-14-2018 in setting pyelonehritis-- resolved  . Chronic migraine   . Chronic pain syndrome   . History of kidney stones   . History of pyelonephritis  hx recurrent  . History of recurrent UTIs   . History of sepsis    07/ 2017 secondary to pyelonephritis  . Hypertension   . Hypothyroidism   . Iron deficiency anemia   . Left ureteral stone   . Mixed hyperlipidemia   . Nephrolithiasis     right side nonobstructive per CT 03-14-2018  . Right shoulder pain    post sx 02-18-2018  . Seizure disorder ([place]) followed by pcp   per pt dx age 66--  no seizure since 1980's , controlled w/ phenobarbitol  . Type 2 diabetes mellitus ([place])    followed by pcp  . Wears glasses     PSH:  Past Surgical History:  Procedure Laterality Date  . CESAREAN SECTION  1974  . CHOLECYSTECTOMY OPEN  2002  . CYSTO/  LEFT RETROGRADE PYELOGRAM  06-11-2006   dr Jeffie Pollock  [facility]  . CYSTOSCOPY WITH RETROGRADE PYELOGRAM, URETEROSCOPY AND STENT PLACEMENT Left 04/10/2018   Procedure: CYSTOSCOPY WITH RETROGRADE PYELOGRAM, URETEROSCOPY AND STENT PLACEMENT;  Surgeon: Franchot Gallo, MD;  Location: [facility];  Service: Urology;  Laterality: Left;  . FOOT ARTHRODESIS Right 09/10/2019   Procedure: ARTHRODESIS FOOT;  Surgeon: Erle Crocker, MD;  Location: [facility];  Service: Orthopedics;  Laterality: Right;  . IR URETERAL STENT PLACEMENT EXISTING ACCESS RIGHT  08-21-2007    dr Karsten Ro  [facility]  . LEFT RETROGRADE PYELOGRAM/ LEFT URETERAL STENT PLACEMENT  02-25-2001   dr Jeffie Pollock [facility]  . NEPHROLITHOTOMY Right 05/14/2016   Procedure: NEPHROLITHOTOMY PERCUTANEOUS;  Surgeon: Franchot Gallo, MD;  [facility];  Service: Urology;  Laterality: Right;  . OPEN REDUCTION INTERNAL FIXATION (ORIF) FOOT LISFRANC FRACTURE Right 09/10/2019   Procedure: OPEN REDUCTION INTERNAL FIXATION (ORIF) FOOT LISFRANC FRACTURE;  Surgeon: Erle Crocker, MD;  Location: [place];  Service: Orthopedics;  Laterality: Right;  . REPAIR EXTENSOR TENDON WITH METATARSAL OSTEOTOMY AND OPEN REDUCTION IN Right 09/10/2019   Procedure: OPEN TREATMENT RIGHT FIRST, SECOND, THIRD TARSOMETATARSAL LISFRANC, MIDFOOT FUSION;  Surgeon: Erle Crocker, MD;  Location: [place];  Service: Orthopedics;  Laterality: Right;  . SHOULDER ARTHROSCOPY Bilateral left 11-13-2000;  right 02-18-2018     Social History:  Social History   Socioeconomic History  . Marital status: Married    Spouse name: Not on file  . Number of children: 2  . Years of education: 2 years of college  . Highest education level: Not on file  Occupational History  . Not on file  Tobacco Use  . Smoking status: Never Smoker  . Smokeless tobacco: Never Used  Vaping Use  . Vaping Use: Never used  Substance and Sexual Activity  . Alcohol use: No    Alcohol/week: 0.0 standard drinks  . Drug use: No  . Sexual activity: Not on file  Other Topics Concern  . Not on file  Social History Narrative  . Not on file   Social Determinants of Health   Financial Resource Strain: Not on file  Food Insecurity: Not on file  Transportation Needs: Not on file  Physical Activity: Not on file  Stress: Not on file  Social Connections: Not on file  Intimate Partner Violence: Not on file    Family History:  Family History  Problem Relation Age of Onset  . Heart disease Father   . Diabetes Father     Medications:   Current Outpatient Medications on File Prior to Visit  Medication Sig Dispense Refill  .  amLODipine (NORVASC) 5 MG tablet Take 1 tablet (5 mg total) by mouth daily. (Patient taking differently: Take 5 mg by mouth daily with supper.) 90 tablet 3  . atorvastatin (LIPITOR) 20 MG tablet Take 1 tablet (20 mg total) by mouth daily at 6 PM.    . BD INSULIN SYRINGE U/F 31G X 5/16" 0.3 ML MISC Use 1 syringe as directed twice a day    . butorphanol (STADOL) 10 MG/ML nasal spray PLACE 1 SPRAY INTO THE NOSE EVERY 4 HOURS AS NEEDED FOR MIGRAINE. (Patient taking differently: Place 1 spray into the nose every 4 (four) hours as needed for headache or migraine. PLACE 1 SPRAY INTO THE NOSE EVERY 4 HOURS AS NEEDED FOR MIGRAINE.) 2.5 mL 5  . Continuous Blood Gluc Sensor (FREESTYLE LIBRE 14 DAY SENSOR) MISC 1 application by Does not apply route 4 (four) times daily -  before meals and at bedtime. 1 each 1  . glipiZIDE  (GLUCOTROL) 10 MG tablet Take 1 tablet (10 mg total) by mouth daily before breakfast.    . insulin NPH Human (NOVOLIN N) 100 UNIT/ML injection Inject 20 Units into the skin in the morning and at bedtime.    Marland Kitchen levothyroxine (SYNTHROID, LEVOTHROID) 75 MCG tablet Take 1 tablet (75 mcg total) by mouth daily. 90 tablet 1  . PHENobarbital (LUMINAL) 97.2 MG tablet TAKE 2 TABLETS BY MOUTH ONCE DAILY ON MON,WED,AND FRI AND 1 TABLET DAILY ON OTHER DAYS (Patient taking differently: Take 97.2-194.4 mg by mouth See admin instructions. TAKE 2 TABLETS (194.4 MG TOTALLY) BY MOUTH ON MON, FRI, SUN; TAKE 1 TABLET (97.2 MG TOTALLY) DAILY ON OTHER DAYS) 135 tablet 1  . senna-docusate (SENOKOT-S) 8.6-50 MG tablet Take 1 tablet by mouth 2 (two) times daily.    Marland Kitchen tiZANidine (ZANAFLEX) 4 MG tablet TAKE 1 TABLET BY MOUTH EVERY 4-6 HOURS AS NEEDED FOR MUSCLE SPASM (Patient taking differently: Take 4 mg by mouth every 4 (four) hours as needed for muscle spasms.) 180 tablet 1  . zolpidem (AMBIEN) 10 MG tablet take 1 tablet by mouth at bedtime for sleep if needed. (Patient taking differently: Take 10 mg by mouth at bedtime as needed for sleep. take 1 tablet by mouth at bedtime for sleep if needed.) 90 tablet 1   No current facility-administered medications on file prior to visit.    Allergies:   Allergies  Allergen Reactions  . Compazine Other (See Comments)    Bad headache, vomiting; IV   . Cymbalta [Duloxetine Hcl] Other (See Comments)    "Kept me awake"  . Methadone Hives  . Escitalopram Anxiety  . Macrobid [Nitrofurantoin Monohyd Macro] Rash      OBJECTIVE:  Physical Exam  Vitals:   10/26/20 1407  BP: (!) 158/78  Pulse: 78  Weight: 174 lb 9.6 oz (79.2 kg)  Height: '4\' 8"'$  (1.422 m)   Body mass index is 39.14 kg/m. No exam data present   Post stroke PHQ 2/9 Depression screen PHQ 2/9 10/05/2020  Decreased Interest 0  Down, Depressed, Hopeless 0  PHQ - 2 Score 0  Altered sleeping 0  Tired, decreased  energy 0  Change in appetite 0  Feeling bad or failure about yourself  0  Trouble concentrating 0  Moving slowly or fidgety/restless 0  Suicidal thoughts 0  PHQ-9 Score 0  Difficult doing work/chores Not difficult at all  Some recent data might be hidden     General: well developed, well nourished,  very pleasant elderly Caucasian female, seated,  in no evident distress Head: head normocephalic and atraumatic.   Neck: supple with no carotid or supraclavicular bruits Cardiovascular: regular rate and rhythm, no murmurs Musculoskeletal: no deformity Skin:  no rash/petichiae Vascular:  Normal pulses all extremities   Neurologic Exam Mental Status: Awake and fully alert.   Fluent speech and language.  Oriented to place and time. Recent and remote memory intact. Attention span, concentration and fund of knowledge appropriate. Mood and affect appropriate.  Cranial Nerves: Fundoscopic exam reveals sharp disc margins. Pupils equal, briskly reactive to light. Extraocular movements full without nystagmus. Visual fields full to confrontation. Hearing intact. Facial sensation intact. Face, tongue, palate moves normally and symmetrically.  Motor: Normal bulk and tone. Normal strength in all tested extremity muscles Sensory.: intact to touch , pinprick , position and vibratory sensation.  Coordination: Rapid alternating movements normal in all extremities. Finger-to-nose and heel-to-shin performed accurately bilaterally. Gait and Station: Arises from chair without difficulty. Stance is normal. Gait demonstrates normal stride length with mild unsteadiness/imbalance Reflexes: 1+ and symmetric. Toes downgoing.     NIHSS  0 Modified Rankin  2      ASSESSMENT: BONNYE SESTO is a 70 y.o. year old female presented with left-sided weakness and mild headache on 09/02/2020 with stroke work-up revealing acute right corona radiata IPH with mild surrounding edema and trace IVH, likely etiology hypertensive.  Vascular risk factors include HTN, HLD, DM, obesity, migraines, seizure disorder and prior strokes on imaging.      PLAN:  1. R CR IPH :  a. Residual deficit: Left foot numbness and gait impairment.  Advised continued participation with [facility] PT and possibly transition to outpatient therapy once completed if indicated.   b. Repeat CT head -if hemorrhage resolved, would recommend initiating aspirin 81 mg daily for secondary stroke prevention as imaging showed chronic small vessel disease infarcts c. Continue atorvastatin 20 mg daily for secondary stroke prevention.   d. Discussed secondary stroke prevention measures and importance of close PCP follow up for aggressive stroke risk factor management  2. HTN: BP goal <130/90.  Stable on amlodipine 5 mg daily per PCP 3. HLD: LDL goal <70. Recent LDL 69.  On atorvastatin 20 mg daily.  Advised to continue to follow with PCP for monitoring and management 4. DMII: A1c goal<7.0. Recent A1c 7.9 down from 8.1.  Advised to continue to follow with PCP for monitoring and management 5. Hx of R foot fracture: s/p surgical correction 09/10/2019.  Planed on hardware removal but currently on hold due to recent stroke. Per Dr. Clydene Fake recommendations, advised to await for any elective procedure for at least 3 months post stroke which was discussed with patient.  Advised her to continue to follow with orthopedics    Follow up in 3 months or call earlier if needed  CC:  [facility] provider: Dr. Herminio Heads, MD    I spent 45 minutes of face-to-face and non-face-to-face time with patient.  This included previsit chart review, lab review, study review, order entry, electronic health record documentation, patient education regarding recent stroke, residual deficits, importance of managing stroke risk factors and answered all questions to patient satisfaction   Frann Rider, [facility]  [facility] [address] [place], [place] [address]  Phone [phone] Fax [phone] Note: This document was prepared with digital dictation and possible smart phrase technology. Any transcriptional errors that result from this process are unintentional.

## 2020-10-26 NOTE — Patient Instructions (Signed)
Continue atorvastatin  for secondary stroke prevention  Continue working with therapy at home - please call office if you would like to participate in additional therapy once completed  Continue to follow with orthopedics regarding removal of right foot hardware - it is recommended that you wait 3 months after your stroke prior to undergoing any elective procedure   Continue to follow up with PCP regarding cholesterol and blood pressure management  Maintain strict control of hypertension with blood pressure goal below 130/90 and cholesterol with LDL cholesterol (bad cholesterol) goal below 70 mg/dL.        Followup in the future with me in 3 months or call earlier if needed       Thank you for coming to see [place] at [facility]. I hope we have been able to provide you high quality care today.  You may receive a patient satisfaction survey over the next few weeks. We would appreciate your feedback and comments so that we may continue to improve ourselves and the health of our patients.

## 2020-10-27 ENCOUNTER — Encounter: Payer: Self-pay | Admitting: Adult Health

## 2020-10-27 ENCOUNTER — Other Ambulatory Visit: Payer: Self-pay | Admitting: Orthopaedic Surgery

## 2020-10-28 ENCOUNTER — Encounter: Payer: Self-pay | Admitting: Adult Health

## 2020-10-31 ENCOUNTER — Telehealth: Payer: Self-pay | Admitting: Adult Health

## 2020-10-31 ENCOUNTER — Encounter: Payer: Medicare Other | Admitting: Gastroenterology

## 2020-10-31 NOTE — Progress Notes (Signed)
I agree with the above plan 

## 2020-10-31 NOTE — Telephone Encounter (Signed)
Medicare/[place] of [place] order sent to GI. No auth they will reach out to the patient to schedule.  

## 2020-11-09 ENCOUNTER — Other Ambulatory Visit: Payer: Self-pay

## 2020-11-09 ENCOUNTER — Ambulatory Visit
Admission: RE | Admit: 2020-11-09 | Discharge: 2020-11-09 | Disposition: A | Discharge status: Home / Self Care | Payer: Medicare Other | Source: Ambulatory Visit | Attending: Adult Health | Admitting: Adult Health

## 2020-11-10 ENCOUNTER — Telehealth: Payer: Self-pay | Admitting: *Deleted

## 2020-11-10 ENCOUNTER — Other Ambulatory Visit: Payer: Self-pay | Admitting: Adult Health

## 2020-11-10 MED ORDER — ASPIRIN EC 81 MG PO TBEC: 81.0000 mg | DELAYED_RELEASE_TABLET | Freq: Every day | ORAL | 11 refills | Status: DC

## 2020-11-10 NOTE — Telephone Encounter (Signed)
Spoke with patient and informed her that recent CT head showed resolution of prior bleed. Recommend restarting aspirin 81 mg daily for secondary stroke prevention.  She stated she will start ASA 81 mg,  verbalized understanding, appreciation.

## 2020-11-15 ENCOUNTER — Encounter: Payer: Self-pay | Admitting: Physical Medicine & Rehabilitation

## 2020-11-15 ENCOUNTER — Encounter: Payer: Medicare Other | Attending: Registered Nurse | Admitting: Physical Medicine & Rehabilitation

## 2020-11-15 ENCOUNTER — Other Ambulatory Visit: Payer: Self-pay

## 2020-11-15 VITALS — BP 153/84 | HR 74 | Temp 98.4°F | Ht <= 58 in | Wt 171.8 lb

## 2020-11-15 DIAGNOSIS — Z8673 Personal history of transient ischemic attack (TIA), and cerebral infarction without residual deficits: Secondary | ICD-10-CM | POA: Insufficient documentation

## 2020-11-15 MED ORDER — GABAPENTIN 100 MG PO CAPS: 100.0000 mg | ORAL_CAPSULE | Freq: Every day | ORAL | 2 refills | Status: DC

## 2020-11-15 NOTE — Progress Notes (Signed)
Subjective:    Patient ID: Natalie [name], female    DOB: Feb 15, 1951, 70 y.o.   MRN: [id number]  70 y.o. female with history of T2DM with neuropathy, CKD 3, chronic pain due to cervicalgia and migraines, seizure disorder who was admitted on 09/02/2020 with acute onset of left-sided weakness with left facial droop and mild slurred speech.  CT head done showing 2 cc acute hematoma right corona radiata with early extension right lateral ventricle.  MRI brain done showing IPH without underlying lesion, few chronic microhemorrhages as well as mild to moderate chronic ischemic changes.  CTA head/neck was negative for LVO, aneurysm or occlusion.  Bleed was felt to be hypertensive in nature and systolic blood pressure goals recommended < 140.  Therapy evaluations were done revealing balance deficits with left-sided weakness affecting ADLs and mobility HPI Bilateral foot numbness  Repeat CT head last week.  Results as below   Mod I ADLs and [place] therapy until 2 wks ago.  Scheduled for right ankle hardware removal next month.    Ambulates without assistive device, the patient was driving prior to her stroke and has resumed doing this because her husband is a Pharmacist, community and is out of town a lot.  She states she did not ask one of her doctors about this.  Patient has a remote history of seizure disorder, back when she was a teenager.  States that she has not had any recurrent seizures  CLINICAL DATA:  Follow-up intracerebral hemorrhage  EXAM: CT HEAD WITHOUT CONTRAST  TECHNIQUE: Contiguous axial images were obtained from the base of the skull through the vertex without intravenous contrast.  COMPARISON:  09/04/2020  FINDINGS: Brain: Previously seen right basal ganglia intraparenchymal hemorrhage has resolved. Bilateral lacunar infarcts and periventricular white matter disease. Old right thalamic lacunar infarct. No acute hemorrhage or hydrocephalus. No acute infarct. Diffuse  cerebral atrophy.  Vascular: No hyperdense vessel or unexpected calcification.  Skull: No acute calvarial abnormality.  Sinuses/Orbits: No acute findings  Other: None  IMPRESSION: Interval resolution of the previously seen intraparenchymal hemorrhage in the right basal ganglia. No new hemorrhage or infarct.  Atrophy, chronic microvascular disease.  No acute intracranial abnormality.  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   Electronically Signed   By: Rolm Baptise M.D.   On: 11/10/2020 00:53 Pain Inventory Average Pain 8 Pain Right Now 5 My pain is burning and tingling  In the last 24 hours, has pain interfered with the following? General activity 5 Relation with others 10 Enjoyment of life 5 What TIME of day is your pain at its worst? evening and night Sleep (in general) Fair  Pain is worse with: walking Pain improves with: medication Relief from Meds: 5  Family History  Problem Relation Age of Onset  . Heart disease Father   . Diabetes Father    Social History   Socioeconomic History  . Marital status: Married    Spouse name: Not on file  . Number of children: 2  . Years of education: 2 years of college  . Highest education level: Not on file  Occupational History  . Not on file  Tobacco Use  . Smoking status: Never Smoker  . Smokeless tobacco: Never Used  Vaping Use  . Vaping Use: Never used  Substance and Sexual Activity  . Alcohol use: No    Alcohol/week: 0.0 standard drinks  . Drug use: No  . Sexual activity: Not on file  Other Topics Concern  .  Not on file  Social History Narrative   ** Merged History Encounter **       Social Determinants of Health   Financial Resource Strain: Not on file  Food Insecurity: Not on file  Transportation Needs: Not on file  Physical Activity: Not on file  Stress: Not on file  Social Connections: Not on file   Past Surgical History:  Procedure Laterality Date  .  CESAREAN SECTION  1974  . CHOLECYSTECTOMY OPEN  2002  . CYSTO/  LEFT RETROGRADE PYELOGRAM  06-11-2006   dr Jeffie Pollock  [facility]  . CYSTOSCOPY WITH RETROGRADE PYELOGRAM, URETEROSCOPY AND STENT PLACEMENT Left 04/10/2018   Procedure: CYSTOSCOPY WITH RETROGRADE PYELOGRAM, URETEROSCOPY AND STENT PLACEMENT;  Surgeon: Franchot Gallo, MD;  Location: [facility];  Service: Urology;  Laterality: Left;  . FOOT ARTHRODESIS Right 09/10/2019   Procedure: ARTHRODESIS FOOT;  Surgeon: Erle Crocker, MD;  Location: [place];  Service: Orthopedics;  Laterality: Right;  . IR URETERAL STENT PLACEMENT EXISTING ACCESS RIGHT  08-21-2007    dr Karsten Ro  [facility]  . LEFT RETROGRADE PYELOGRAM/ LEFT URETERAL STENT PLACEMENT  02-25-2001   dr Jeffie Pollock [facility]  . NEPHROLITHOTOMY Right 05/14/2016   Procedure: NEPHROLITHOTOMY PERCUTANEOUS;  Surgeon: Franchot Gallo, MD;  Location: [facility];  Service: Urology;  Laterality: Right;  . OPEN REDUCTION INTERNAL FIXATION (ORIF) FOOT LISFRANC FRACTURE Right 09/10/2019   Procedure: OPEN REDUCTION INTERNAL FIXATION (ORIF) FOOT LISFRANC FRACTURE;  Surgeon: Erle Crocker, MD;  Location: [place];  Service: Orthopedics;  Laterality: Right;  . REPAIR EXTENSOR TENDON WITH METATARSAL OSTEOTOMY AND OPEN REDUCTION IN Right 09/10/2019   Procedure: OPEN TREATMENT RIGHT FIRST, SECOND, THIRD TARSOMETATARSAL LISFRANC, MIDFOOT FUSION;  Surgeon: Erle Crocker, MD;  Location: [place];  Service: Orthopedics;  Laterality: Right;  . SHOULDER ARTHROSCOPY Bilateral left 11-13-2000;  right 02-18-2018   Past Surgical History:  Procedure Laterality Date  . CESAREAN SECTION  1974  . CHOLECYSTECTOMY OPEN  2002  . CYSTO/  LEFT RETROGRADE PYELOGRAM  06-11-2006   dr Jeffie Pollock  [facility]  . CYSTOSCOPY WITH RETROGRADE PYELOGRAM, URETEROSCOPY AND STENT PLACEMENT Left 04/10/2018   Procedure: CYSTOSCOPY WITH RETROGRADE PYELOGRAM, URETEROSCOPY AND STENT  PLACEMENT;  Surgeon: Franchot Gallo, MD;  Location: [facility];  Service: Urology;  Laterality: Left;  . FOOT ARTHRODESIS Right 09/10/2019   Procedure: ARTHRODESIS FOOT;  Surgeon: Erle Crocker, MD;  Location: [place];  Service: Orthopedics;  Laterality: Right;  . IR URETERAL STENT PLACEMENT EXISTING ACCESS RIGHT  08-21-2007    dr Karsten Ro  [facility]  . LEFT RETROGRADE PYELOGRAM/ LEFT URETERAL STENT PLACEMENT  02-25-2001   dr Jeffie Pollock [facility]  . NEPHROLITHOTOMY Right 05/14/2016   Procedure: NEPHROLITHOTOMY PERCUTANEOUS;  Surgeon: Franchot Gallo, MD;  Location[facility];  Service: Urology;  Laterality: Right;  . OPEN REDUCTION INTERNAL FIXATION (ORIF) FOOT LISFRANC FRACTURE Right 09/10/2019   Procedure: OPEN REDUCTION INTERNAL FIXATION (ORIF) FOOT LISFRANC FRACTURE;  Surgeon: Erle Crocker, MD;  Location: [place];  Service: Orthopedics;  Laterality: Right;  . REPAIR EXTENSOR TENDON WITH METATARSAL OSTEOTOMY AND OPEN REDUCTION IN Right 09/10/2019   Procedure: OPEN TREATMENT RIGHT FIRST, SECOND, THIRD TARSOMETATARSAL LISFRANC, MIDFOOT FUSION;  Surgeon: Erle Crocker, MD;  Location: [place];  Service: Orthopedics;  Laterality: Right;  . SHOULDER ARTHROSCOPY Bilateral left 11-13-2000;  right 02-18-2018   Past Medical History:  Diagnosis Date  . Arthritis   . Atrophy of left  kidney   . Cervicalgia   . Chronic kidney disease, stage 3 ([place])    acute aki 03-14-2018 in setting pyelonehritis-- resolved  . Chronic migraine   . Chronic pain syndrome   . History of kidney stones   . History of pyelonephritis    hx recurrent  . History of recurrent UTIs   . History of sepsis    07/ 2017 secondary to pyelonephritis  . Hypertension   . Hypothyroidism   . Iron deficiency anemia   . Left ureteral stone   . Mixed hyperlipidemia   . Nephrolithiasis    right side nonobstructive per CT 03-14-2018  . Right shoulder pain     post sx 02-18-2018  . Seizure disorder ([place]) followed by pcp   per pt dx age 35--  no seizure since 1980's , controlled w/ phenobarbitol  . Type 2 diabetes mellitus ([place])    followed by pcp  . Wears glasses    BP (!) 153/84   Pulse 74   Temp 98.4 F (36.9 C)   Ht '4\' 8"'$  (1.422 m)   Wt 171 lb 12.8 oz (77.9 kg)   SpO2 95%   BMI 38.52 kg/m   Opioid Risk Score:   Fall Risk Score:  `1  Depression screen PHQ 2/9  Depression screen [facility] 2/9 11/15/2020 10/05/2020 12/01/2018 08/07/2018 05/05/2018 03/19/2018 03/03/2018  Decreased Interest 0 0 0 0 0 0 0  Down, Depressed, Hopeless 0 0 0 0 0 0 0  PHQ - 2 Score 0 0 0 0 0 0 0  Altered sleeping - 0 - - - - -  Tired, decreased energy - 0 - - - - -  Change in appetite - 0 - - - - -  Feeling bad or failure about yourself  - 0 - - - - -  Trouble concentrating - 0 - - - - -  Moving slowly or fidgety/restless - 0 - - - - -  Suicidal thoughts - 0 - - - - -  PHQ-9 Score - 0 - - - - -  Difficult doing work/chores - Not difficult at all - - - - -  Some recent data might be hidden    Review of Systems  Constitutional: Negative.   HENT: Negative.   Eyes: Negative.   Respiratory: Negative.   Cardiovascular: Negative.   Gastrointestinal: Negative.   Musculoskeletal: Positive for myalgias.       Shoulders hips feet and right knee  Skin: Negative.   Neurological: Negative.   Hematological: Negative.   Psychiatric/Behavioral: Negative.   All other systems reviewed and are negative.      Objective:   Physical Exam Motor strength is 5/5 bilateral deltoid bicep tricep grip hip flexor knee extensor ankle dorsiflexor Sensation intact light touch bilateral upper limbs, of the lower limbs the patient has intact light touch but reduced pinprick sensation below the ankle on the right and below the mid calf on the left also absent proprioception in the toes bilaterally There is no evidence of ataxia in the upper and lower extremities Patient is able ambulate  without assistive device no evidence of toe drag or knee instability Tone is normal in bilateral upper and lower limbs Cranial nerves II through XII are intact Mood and affect are appropriate Speech without dysarthria or aphasia. Alert and oriented x4 Visual fields are intact confrontation testing      Assessment & Plan:  #1.  History of right subcortical bleed with excellent improvement of her left  hemiparesis.  No other focal neurologic deficits.  Follow-up CT of the head shows resolution of intracranial bleed. Patient does not have any residual functional deficits do not think she needs any further therapy or physical medicine rehab follow-up I asked the patient to check with neurology in regards to driving although at this point patient states she has been driving without difficulties. #2.  Bilateral foot numbness and some dysesthetic pain.  Her pain is mainly at night, will start gabapentin 100 mg nightly.  Patient has a follow-up appoint with neurology who can evaluate further.

## 2020-11-29 ENCOUNTER — Encounter (HOSPITAL_BASED_OUTPATIENT_CLINIC_OR_DEPARTMENT_OTHER): Payer: Self-pay | Admitting: Orthopaedic Surgery

## 2020-11-29 ENCOUNTER — Other Ambulatory Visit: Payer: Self-pay

## 2020-11-29 NOTE — Progress Notes (Signed)
Patient's chart and recent neuro note reviewed with Dr Valma Cava, [place] for [facility].

## 2020-12-02 ENCOUNTER — Encounter (HOSPITAL_BASED_OUTPATIENT_CLINIC_OR_DEPARTMENT_OTHER)
Admission: RE | Admit: 2020-12-02 | Discharge: 2020-12-02 | Disposition: A | Discharge status: Home / Self Care | Payer: Medicare Other | Source: Ambulatory Visit | Attending: Orthopaedic Surgery | Admitting: Orthopaedic Surgery

## 2020-12-02 ENCOUNTER — Other Ambulatory Visit (HOSPITAL_COMMUNITY)
Admission: RE | Admit: 2020-12-02 | Discharge: 2020-12-02 | Disposition: A | Discharge status: Home / Self Care | Payer: Medicare Other | Source: Ambulatory Visit | Attending: Orthopaedic Surgery | Admitting: Orthopaedic Surgery

## 2020-12-02 DIAGNOSIS — Z01812 Encounter for preprocedural laboratory examination: Secondary | ICD-10-CM | POA: Insufficient documentation

## 2020-12-02 DIAGNOSIS — U071 COVID-19: Secondary | ICD-10-CM | POA: Insufficient documentation

## 2020-12-02 LAB — BASIC METABOLIC PANEL
Anion gap: 9 (ref 5–15)
BUN: 27 mg/dL — ABNORMAL HIGH (ref 8–23)
CO2: 23 mmol/L (ref 22–32)
Calcium: 10.1 mg/dL (ref 8.9–10.3)
Chloride: 106 mmol/L (ref 98–111)
Creatinine, Ser: 1.45 mg/dL — ABNORMAL HIGH (ref 0.44–1.00)
GFR, Estimated: 39 mL/min — ABNORMAL LOW (ref >60)
Glucose, Bld: 107 mg/dL — ABNORMAL HIGH (ref 70–99)
Potassium: 5.1 mmol/L (ref 3.5–5.1)
Sodium: 138 mmol/L (ref 135–145)

## 2020-12-02 LAB — SARS CORONAVIRUS 2 (TAT 6-24 HRS): SARS Coronavirus 2: POSITIVE — AB

## 2020-12-02 NOTE — Progress Notes (Signed)

## 2020-12-03 ENCOUNTER — Telehealth: Payer: Self-pay | Admitting: Unknown Physician Specialty

## 2020-12-03 NOTE — Progress Notes (Signed)
Notified Dr Melony Overly that patient tested + for Covid on 3/4.  Patient is scheduled for surgery on 3/7.  Patient can be rescheduled for 10 days post + result if mild symptoms/asymptomatic.  If immunocompromised will need to wait 21 days.

## 2020-12-03 NOTE — Telephone Encounter (Signed)
Called to discuss with patient about COVID-19 symptoms and the use of one of the available treatments for those with mild to moderate Covid symptoms and at a high risk of hospitalization.  Pt appears to qualify for outpatient treatment due to co-morbid conditions and/or a member of an at-risk group in accordance with the FDA Emergency Use Authorization.    Pt is asymptomatic.  Covid tx hotline number given if she develops symptoms    Natalie [name]

## 2020-12-20 ENCOUNTER — Other Ambulatory Visit: Payer: Self-pay

## 2020-12-23 ENCOUNTER — Other Ambulatory Visit (HOSPITAL_COMMUNITY): Payer: Medicare Other

## 2020-12-27 ENCOUNTER — Ambulatory Visit (HOSPITAL_BASED_OUTPATIENT_CLINIC_OR_DEPARTMENT_OTHER)
Admission: RE | Admit: 2020-12-27 | Discharge: 2020-12-27 | Disposition: A | Discharge status: Home / Self Care | Payer: Medicare Other | Attending: Orthopaedic Surgery | Admitting: Orthopaedic Surgery

## 2020-12-27 ENCOUNTER — Ambulatory Visit (HOSPITAL_BASED_OUTPATIENT_CLINIC_OR_DEPARTMENT_OTHER): Payer: Medicare Other | Admitting: Anesthesiology

## 2020-12-27 ENCOUNTER — Encounter (HOSPITAL_BASED_OUTPATIENT_CLINIC_OR_DEPARTMENT_OTHER)
Admission: RE | Disposition: A | Discharge status: Home / Self Care | Payer: Self-pay | Source: Home / Self Care | Attending: Orthopaedic Surgery

## 2020-12-27 ENCOUNTER — Encounter (HOSPITAL_BASED_OUTPATIENT_CLINIC_OR_DEPARTMENT_OTHER): Payer: Self-pay | Admitting: Orthopaedic Surgery

## 2020-12-27 ENCOUNTER — Other Ambulatory Visit: Payer: Self-pay

## 2020-12-27 DIAGNOSIS — G894 Chronic pain syndrome: Secondary | ICD-10-CM | POA: Insufficient documentation

## 2020-12-27 DIAGNOSIS — Z79899 Other long term (current) drug therapy: Secondary | ICD-10-CM | POA: Insufficient documentation

## 2020-12-27 DIAGNOSIS — Z8249 Family history of ischemic heart disease and other diseases of the circulatory system: Secondary | ICD-10-CM | POA: Diagnosis not present

## 2020-12-27 DIAGNOSIS — Z881 Allergy status to other antibiotic agents status: Secondary | ICD-10-CM | POA: Insufficient documentation

## 2020-12-27 DIAGNOSIS — Z7984 Long term (current) use of oral hypoglycemic drugs: Secondary | ICD-10-CM | POA: Insufficient documentation

## 2020-12-27 DIAGNOSIS — G40909 Epilepsy, unspecified, not intractable, without status epilepticus: Secondary | ICD-10-CM | POA: Insufficient documentation

## 2020-12-27 DIAGNOSIS — Z981 Arthrodesis status: Secondary | ICD-10-CM | POA: Diagnosis not present

## 2020-12-27 DIAGNOSIS — N183 Chronic kidney disease, stage 3 unspecified: Secondary | ICD-10-CM | POA: Diagnosis not present

## 2020-12-27 DIAGNOSIS — I129 Hypertensive chronic kidney disease with stage 1 through stage 4 chronic kidney disease, or unspecified chronic kidney disease: Secondary | ICD-10-CM | POA: Insufficient documentation

## 2020-12-27 DIAGNOSIS — I69354 Hemiplegia and hemiparesis following cerebral infarction affecting left non-dominant side: Secondary | ICD-10-CM | POA: Insufficient documentation

## 2020-12-27 DIAGNOSIS — E1122 Type 2 diabetes mellitus with diabetic chronic kidney disease: Secondary | ICD-10-CM | POA: Diagnosis not present

## 2020-12-27 DIAGNOSIS — Z833 Family history of diabetes mellitus: Secondary | ICD-10-CM | POA: Diagnosis not present

## 2020-12-27 DIAGNOSIS — Z7982 Long term (current) use of aspirin: Secondary | ICD-10-CM | POA: Diagnosis not present

## 2020-12-27 DIAGNOSIS — Z472 Encounter for removal of internal fixation device: Secondary | ICD-10-CM | POA: Insufficient documentation

## 2020-12-27 DIAGNOSIS — Z888 Allergy status to other drugs, medicaments and biological substances status: Secondary | ICD-10-CM | POA: Diagnosis not present

## 2020-12-27 HISTORY — PX: HARDWARE REMOVAL: SHX979

## 2020-12-27 LAB — GLUCOSE, CAPILLARY
Glucose-Capillary: 122 mg/dL — ABNORMAL HIGH (ref 70–99)
Glucose-Capillary: 137 mg/dL — ABNORMAL HIGH (ref 70–99)

## 2020-12-27 SURGERY — REMOVAL, HARDWARE
Anesthesia: General | Site: Foot | Laterality: Right

## 2020-12-27 MED ORDER — LACTATED RINGERS IV SOLN: INTRAVENOUS | Status: DC

## 2020-12-27 MED ORDER — PHENYLEPHRINE 40 MCG/ML (10ML) SYRINGE FOR IV PUSH (FOR BLOOD PRESSURE SUPPORT)
PREFILLED_SYRINGE | INTRAVENOUS | Status: AC
  Filled 2020-12-27: qty 10

## 2020-12-27 MED ORDER — ACETAMINOPHEN 500 MG PO TABS
1000.0000 mg | ORAL_TABLET | Freq: Once | ORAL | Status: AC
  Administered 2020-12-27: 1000 mg via ORAL

## 2020-12-27 MED ORDER — FENTANYL CITRATE (PF) 100 MCG/2ML IJ SOLN
INTRAMUSCULAR | Status: AC
  Filled 2020-12-27: qty 2

## 2020-12-27 MED ORDER — LIDOCAINE 2% (20 MG/ML) 5 ML SYRINGE
INTRAMUSCULAR | Status: AC
  Filled 2020-12-27: qty 5

## 2020-12-27 MED ORDER — CEFAZOLIN SODIUM-DEXTROSE 2-4 GM/100ML-% IV SOLN
2.0000 g | INTRAVENOUS | Status: AC
  Administered 2020-12-27: 2 g via INTRAVENOUS

## 2020-12-27 MED ORDER — FENTANYL CITRATE (PF) 100 MCG/2ML IJ SOLN
25.0000 ug | INTRAMUSCULAR | Status: DC | PRN
Start: 2020-12-27 — End: 2020-12-27

## 2020-12-27 MED ORDER — ROCURONIUM BROMIDE 10 MG/ML (PF) SYRINGE
PREFILLED_SYRINGE | INTRAVENOUS | Status: AC
  Filled 2020-12-27: qty 10

## 2020-12-27 MED ORDER — EPHEDRINE 5 MG/ML INJ
INTRAVENOUS | Status: AC
  Filled 2020-12-27: qty 10

## 2020-12-27 MED ORDER — ACETAMINOPHEN 500 MG PO TABS
ORAL_TABLET | ORAL | Status: AC
  Filled 2020-12-27: qty 2

## 2020-12-27 MED ORDER — DEXAMETHASONE SODIUM PHOSPHATE 10 MG/ML IJ SOLN
INTRAMUSCULAR | Status: DC | PRN
  Administered 2020-12-27: 5 mg via INTRAVENOUS

## 2020-12-27 MED ORDER — ONDANSETRON HCL 4 MG/2ML IJ SOLN
INTRAMUSCULAR | Status: AC
  Filled 2020-12-27: qty 2

## 2020-12-27 MED ORDER — SUCCINYLCHOLINE CHLORIDE 200 MG/10ML IV SOSY
PREFILLED_SYRINGE | INTRAVENOUS | Status: AC
  Filled 2020-12-27: qty 10

## 2020-12-27 MED ORDER — MIDAZOLAM HCL 2 MG/2ML IJ SOLN
INTRAMUSCULAR | Status: AC
  Filled 2020-12-27: qty 2

## 2020-12-27 MED ORDER — BUPIVACAINE HCL (PF) 0.5 % IJ SOLN
INTRAMUSCULAR | Status: DC | PRN
  Administered 2020-12-27: 25 mL via PERINEURAL

## 2020-12-27 MED ORDER — OXYCODONE HCL 5 MG PO TABS
ORAL_TABLET | ORAL | Status: AC
  Filled 2020-12-27: qty 1

## 2020-12-27 MED ORDER — PHENYLEPHRINE HCL (PRESSORS) 10 MG/ML IV SOLN
INTRAVENOUS | Status: DC | PRN
  Administered 2020-12-27: 40 ug via INTRAVENOUS

## 2020-12-27 MED ORDER — ONDANSETRON HCL 4 MG/2ML IJ SOLN
INTRAMUSCULAR | Status: DC | PRN
  Administered 2020-12-27: 4 mg via INTRAVENOUS

## 2020-12-27 MED ORDER — OXYCODONE HCL 5 MG PO TABS
5.0000 mg | ORAL_TABLET | Freq: Once | ORAL | Status: AC | PRN
  Administered 2020-12-27: 5 mg via ORAL

## 2020-12-27 MED ORDER — CEFAZOLIN SODIUM-DEXTROSE 2-4 GM/100ML-% IV SOLN
INTRAVENOUS | Status: AC
  Filled 2020-12-27: qty 100

## 2020-12-27 MED ORDER — PROPOFOL 10 MG/ML IV BOLUS
INTRAVENOUS | Status: DC | PRN
  Administered 2020-12-27: 130 mg via INTRAVENOUS

## 2020-12-27 MED ORDER — MIDAZOLAM HCL 2 MG/2ML IJ SOLN
2.0000 mg | Freq: Once | INTRAMUSCULAR | Status: AC
  Administered 2020-12-27: 2 mg via INTRAVENOUS

## 2020-12-27 MED ORDER — FENTANYL CITRATE (PF) 100 MCG/2ML IJ SOLN
100.0000 ug | Freq: Once | INTRAMUSCULAR | Status: AC
  Administered 2020-12-27: 100 ug via INTRAVENOUS

## 2020-12-27 MED ORDER — OXYCODONE HCL 5 MG PO TABS: 5.0000 mg | ORAL_TABLET | ORAL | 0 refills | Status: AC | PRN

## 2020-12-27 MED ORDER — LIDOCAINE HCL (CARDIAC) PF 100 MG/5ML IV SOSY
PREFILLED_SYRINGE | INTRAVENOUS | Status: DC | PRN
  Administered 2020-12-27: 100 mg via INTRAVENOUS

## 2020-12-27 MED ORDER — OXYCODONE HCL 5 MG/5ML PO SOLN: 5.0000 mg | Freq: Once | ORAL | Status: AC | PRN

## 2020-12-27 SURGICAL SUPPLY — 63 items
APL PRP STRL LF DISP 70% ISPRP (MISCELLANEOUS) ×1
APL SKNCLS STERI-STRIP NONHPOA (GAUZE/BANDAGES/DRESSINGS)
BANDAGE ESMARK 6X9 LF (GAUZE/BANDAGES/DRESSINGS) IMPLANT
BENZOIN TINCTURE PRP APPL 2/3 (GAUZE/BANDAGES/DRESSINGS) IMPLANT
BLADE SURG 15 STRL LF DISP TIS (BLADE) ×4 IMPLANT
BLADE SURG 15 STRL SS (BLADE) ×8
BNDG CMPR 9X4 STRL LF SNTH (GAUZE/BANDAGES/DRESSINGS) ×1
BNDG CMPR 9X6 STRL LF SNTH (GAUZE/BANDAGES/DRESSINGS)
BNDG ELASTIC 4X5.8 VLCR STR LF (GAUZE/BANDAGES/DRESSINGS) ×1 IMPLANT
BNDG ELASTIC 6X5.8 VLCR STR LF (GAUZE/BANDAGES/DRESSINGS) IMPLANT
BNDG ESMARK 4X9 LF (GAUZE/BANDAGES/DRESSINGS) ×1 IMPLANT
BNDG ESMARK 6X9 LF (GAUZE/BANDAGES/DRESSINGS)
CHLORAPREP W/TINT 26 (MISCELLANEOUS) ×2 IMPLANT
COVER BACK TABLE 60X90IN (DRAPES) ×2 IMPLANT
COVER WAND RF STERILE (DRAPES) IMPLANT
CUFF TOURN SGL QUICK 34 (TOURNIQUET CUFF)
CUFF TRNQT CYL 34X4.125X (TOURNIQUET CUFF) IMPLANT
DECANTER SPIKE VIAL GLASS SM (MISCELLANEOUS) IMPLANT
DRAPE C-ARM 42X72 X-RAY (DRAPES) IMPLANT
DRAPE EXTREMITY T 121X128X90 (DISPOSABLE) ×2 IMPLANT
DRAPE IMP U-DRAPE 54X76 (DRAPES) ×2 IMPLANT
DRAPE OEC MINIVIEW 54X84 (DRAPES) ×2 IMPLANT
DRAPE U-SHAPE 47X51 STRL (DRAPES) ×2 IMPLANT
DRSG PAD ABDOMINAL 8X10 ST (GAUZE/BANDAGES/DRESSINGS) IMPLANT
ELECT REM PT RETURN 9FT ADLT (ELECTROSURGICAL) ×2
ELECTRODE REM PT RTRN 9FT ADLT (ELECTROSURGICAL) ×1 IMPLANT
GAUZE SPONGE 4X4 12PLY STRL (GAUZE/BANDAGES/DRESSINGS) ×2 IMPLANT
GAUZE XEROFORM 1X8 LF (GAUZE/BANDAGES/DRESSINGS) ×2 IMPLANT
GLOVE SRG 8 PF TXTR STRL LF DI (GLOVE) ×2 IMPLANT
GLOVE SURG ENC TEXT LTX SZ7.5 (GLOVE) ×4 IMPLANT
GLOVE SURG UNDER POLY LF SZ8 (GLOVE) ×4
GOWN STRL REUS W/ TWL LRG LVL3 (GOWN DISPOSABLE) ×1 IMPLANT
GOWN STRL REUS W/ TWL XL LVL3 (GOWN DISPOSABLE) ×1 IMPLANT
GOWN STRL REUS W/TWL LRG LVL3 (GOWN DISPOSABLE) ×2
GOWN STRL REUS W/TWL XL LVL3 (GOWN DISPOSABLE) ×4
NDL HYPO 25X1 1.5 SAFETY (NEEDLE) IMPLANT
NEEDLE HYPO 25X1 1.5 SAFETY (NEEDLE) IMPLANT
NS IRRIG 1000ML POUR BTL (IV SOLUTION) ×2 IMPLANT
PACK BASIN DAY SURGERY FS (CUSTOM PROCEDURE TRAY) ×2 IMPLANT
PAD CAST 4YDX4 CTTN HI CHSV (CAST SUPPLIES) ×1 IMPLANT
PADDING CAST COTTON 4X4 STRL (CAST SUPPLIES) ×2
PADDING CAST SYNTHETIC 4 (CAST SUPPLIES)
PADDING CAST SYNTHETIC 4X4 STR (CAST SUPPLIES) IMPLANT
PENCIL SMOKE EVACUATOR (MISCELLANEOUS) ×2 IMPLANT
SHEET MEDIUM DRAPE 40X70 STRL (DRAPES) ×2 IMPLANT
SLEEVE SCD COMPRESS KNEE MED (STOCKING) ×2 IMPLANT
SPLINT FAST PLASTER 5X30 (CAST SUPPLIES)
SPLINT PLASTER CAST FAST 5X30 (CAST SUPPLIES) IMPLANT
SPONGE LAP 18X18 RF (DISPOSABLE) IMPLANT
STOCKINETTE 6  STRL (DRAPES) ×2
STOCKINETTE 6 STRL (DRAPES) ×1 IMPLANT
STRIP CLOSURE SKIN 1/2X4 (GAUZE/BANDAGES/DRESSINGS) IMPLANT
SUCTION FRAZIER HANDLE 10FR (MISCELLANEOUS)
SUCTION TUBE FRAZIER 10FR DISP (MISCELLANEOUS) IMPLANT
SUT ETHILON 3 0 PS 1 (SUTURE) ×2 IMPLANT
SUT MNCRL AB 3-0 PS2 18 (SUTURE) ×2 IMPLANT
SUT PDS AB 2-0 CT2 27 (SUTURE) ×2 IMPLANT
SUT VIC AB 3-0 FS2 27 (SUTURE) IMPLANT
SYR BULB EAR ULCER 3OZ GRN STR (SYRINGE) ×2 IMPLANT
SYR CONTROL 10ML LL (SYRINGE) IMPLANT
TOWEL GREEN STERILE FF (TOWEL DISPOSABLE) ×4 IMPLANT
TUBE CONNECTING 20X1/4 (TUBING) IMPLANT
UNDERPAD 30X36 HEAVY ABSORB (UNDERPADS AND DIAPERS) ×2 IMPLANT

## 2020-12-27 NOTE — Op Note (Signed)
  Natalie [name] female 70 y.o. 12/27/2020  PreOperative Diagnosis: Right foot symptomatic orthopaedic hardware.  PostOperative Diagnosis: same  PROCEDURE: Deep hardware removal right foot  SURGEON: Melony Overly, MD  ASSISTANT: Jesse Martinique PA-S  ANESTHESIA: General LMA with ankle blockade by anesthesia  FINDINGS: Retained deep orthopedic hardware, right foot  IMPLANTS: None  INDICATIONS:70 y.o. female had a Lisfranc fracture dislocation underwent arthrodesis of first tarsometatarsal, second, third and Lisfranc joint.  She had symptomatic orthopedic hardware and requested to have it removed.  Her original surgery was in December 2020 and she showed good bony healing on x-ray.   Patient understood the risks, benefits and alternatives to surgery which include but are not limited to wound healing complications, infection, nonunion, malunion, need for further surgery as well as damage to surrounding structures. They also understood the potential for continued pain in that there were no guarantees of acceptable outcome After weighing these risks the patient opted to proceed with surgery.  PROCEDURE: Patient was identified in the preoperative holding area.  The right foot was marked by myself.  Consent was signed by myself and the patient.  Block was performed by anesthesia in the preoperative holding area.  Patient was taken to the operative suite and placed supine on the operative table.  General LMA anesthesia was induced without difficulty. Bump was placed under the operative hip and bone foam was used.  All bony prominences were well padded.    Preoperative antibiotics were given. The extremity was prepped and draped in the usual sterile fashion and surgical timeout was performed.  4 inch Esmarch tourniquet was placed    Began by making a longitudinal incision overlying her prior dorsal foot incision.  Was taken sharply down to skin and subcutaneous tissue.  The extensor hallucis  brevis muscle belly was identified and mobilized.  The neurovascular bundle beneath this was identified and protected to the entirety the case.  The dissection was taken down to the hardware that was identified.  Sequentially the hardware was removed from the Lisfranc joint, second tarsometatarsal joint, third tarsometatarsal joint and ultimately the first tarsometatarsal joint.  Hardware is removed without difficulty.  It was discarded.  The wound was irrigated copiously with normal saline.  The joints were manipulated and found to be stable.  Bony prominences were smoothed off with a rondure.  Wound was reirrigated.  A curette was used to clean out the screw holes.  Fluoroscopic imaging confirmed complete hardware removal.  Wound was irrigated and closed in a layered fashion using 3-0 Monocryl 0 nylon suture.  She was placed in a soft dressing.  Tourniquet was released.  She is awake from anesthesia and taken recovery in stable condition.  There are no complications.  Counts were correct at the end of the case.  POST OPERATIVE INSTRUCTIONS: Weightbearing as tolerated in a walking boot Keep dressing in place until follow-up Follow-up in 2 weeks for suture movable if appropriate. Call the office with concerns  BLOOD LOSS:  Minimal         DRAINS: none         SPECIMEN: none       COMPLICATIONS:  * No complications entered in OR log *         Disposition: PACU - hemodynamically stable.         Condition: stable

## 2020-12-27 NOTE — Anesthesia Preprocedure Evaluation (Addendum)
Anesthesia Evaluation  Patient identified by MRN, date of birth, ID band Patient awake    Reviewed: Allergy & Precautions, NPO status , Patient's Chart, lab work & pertinent test results  History of Anesthesia Complications Negative for: history of anesthetic complications  Airway Mallampati: III  TM Distance: >3 FB Neck ROM: Full    Dental  (+) Missing, Chipped,    Pulmonary neg pulmonary ROS,    Pulmonary exam normal        Cardiovascular hypertension, Pt. on medications Normal cardiovascular exam  TTE 08/2020: EF 60-65%, grade I DD, valves ok   Neuro/Psych  Headaches, Seizures - (last in 1980s), Well Controlled,  CVA (08/2020) negative psych ROS   GI/Hepatic negative GI ROS, Neg liver ROS,   Endo/Other  diabetes, Type 2, Oral Hypoglycemic AgentsHypothyroidism BMI 39  Renal/GU Renal InsufficiencyRenal disease  negative genitourinary   Musculoskeletal  (+) Arthritis ,   Abdominal   Peds  Hematology  (+) anemia ,   Anesthesia Other Findings Day of surgery medications reviewed with patient.  Reproductive/Obstetrics negative OB ROS                            Anesthesia Physical Anesthesia Plan  ASA: III  Anesthesia Plan: General   Post-op Pain Management: GA combined w/ Regional for post-op pain   Induction: Intravenous  PONV Risk Score and Plan: 3 and Treatment may vary due to age or medical condition, Ondansetron and Dexamethasone  Airway Management Planned: LMA  Additional Equipment: None  Intra-op Plan:   Post-operative Plan: Extubation in OR  Informed Consent: I have reviewed the patients History and Physical, chart, labs and discussed the procedure including the risks, benefits and alternatives for the proposed anesthesia with the patient or authorized representative who has indicated his/her understanding and acceptance.     Dental advisory given  Plan Discussed  with: CRNA  Anesthesia Plan Comments:        Anesthesia Quick Evaluation

## 2020-12-27 NOTE — Anesthesia Procedure Notes (Signed)
Anesthesia Regional Block: Ankle block   Pre-Anesthetic Checklist: ,, timeout performed, Correct Patient, Correct Site, Correct Laterality, Correct Procedure, Correct Position, site marked, Risks and benefits discussed, pre-op evaluation,  At surgeon's request and post-op pain management  Laterality: Right  Prep: Maximum Sterile Barrier Precautions used, chloraprep       Needles:  Injection technique: Single-shot  Needle Type: Echogenic Needle     Needle Length: 4cm  Needle Gauge: 25     Additional Needles:   Narrative:  Start time: 12/27/2020 10:30 AM End time: 12/27/2020 10:32 AM  Performed by: Personally  Anesthesiologist: Brennan Bailey, MD  Additional Notes: Risks, benefits, and alternative discussed. Patient gave consent for procedure. Patient prepped and draped in sterile fashion. Sedation administered, patient remains easily responsive to voice. Local anesthetic given in 5cc increments with no signs or symptoms of intravascular injection. No pain or paraesthesias with injection. Patient monitored throughout procedure with signs of LAST or immediate complications. Tolerated well.   Tawny Asal, MD

## 2020-12-27 NOTE — Progress Notes (Signed)
Assisted Dr. Daiva Huge with right, ultrasound guided ankle block. Side rails up, monitors on throughout procedure. See vital signs in flow sheet. Tolerated Procedure well.

## 2020-12-27 NOTE — Anesthesia Procedure Notes (Signed)
Procedure Name: LMA Insertion Date/Time: 12/27/2020 10:46 AM Performed by: Lavonia Dana, CRNA Pre-anesthesia Checklist: Patient identified, Emergency Drugs available, Suction available and Patient being monitored Patient Re-evaluated:Patient Re-evaluated prior to induction Oxygen Delivery Method: Circle system utilized Preoxygenation: Pre-oxygenation with 100% oxygen Induction Type: IV induction Ventilation: Mask ventilation without difficulty LMA: LMA inserted LMA Size: 4.0 Number of attempts: 1 Airway Equipment and Method: Bite block Placement Confirmation: positive ETCO2 Tube secured with: Tape Dental Injury: Teeth and Oropharynx as per pre-operative assessment

## 2020-12-27 NOTE — H&P (Signed)
PREOPERATIVE H&P  Chief Complaint: Right foot symptomatic deep orthopedic hardware  HPI: Natalie [name] is a 70 y.o. female who presents for preoperative history and physical with a diagnosis of symptomatic orthopedic hardware of her right foot.  She underwent midfoot fixation for Lisfranc injury in December 2020.  She had some prominent hardware on the dorsal foot and asked to have it removed.  She is here today to have this done.  She did recently have a stroke and has some left-sided residual numbness and weakness.. Symptoms are rated as moderate to severe, and have been worsening.  This is significantly impairing activities of daily living.  She has elected for surgical management.   Past Medical History:  Diagnosis Date  . Arthritis   . Atrophy of left kidney   . Cervicalgia   . Chronic kidney disease, stage 3 ([place])    acute aki 03-14-2018 in setting pyelonehritis-- resolved  . Chronic migraine   . Chronic pain syndrome   . History of kidney stones   . History of pyelonephritis    hx recurrent  . History of recurrent UTIs   . History of sepsis    07/ 2017 secondary to pyelonephritis  . Hypertension   . Hypothyroidism   . Iron deficiency anemia   . Left ureteral stone   . Mixed hyperlipidemia   . Nephrolithiasis    right side nonobstructive per CT 03-14-2018  . Right shoulder pain    post sx 02-18-2018  . Seizure disorder ([place]) followed by pcp   per pt dx age 75--  no seizure since 1980's , controlled w/ phenobarbitol  . Stroke ([place]) 08/2020   no deficits  . Type 2 diabetes mellitus ([place])    followed by pcp  . Wears glasses    Past Surgical History:  Procedure Laterality Date  . CESAREAN SECTION  1974  . CHOLECYSTECTOMY OPEN  2002  . CYSTO/  LEFT RETROGRADE PYELOGRAM  06-11-2006   dr Jeffie Pollock  [facility]  . CYSTOSCOPY WITH RETROGRADE PYELOGRAM, URETEROSCOPY AND STENT PLACEMENT Left 04/10/2018   Procedure: CYSTOSCOPY WITH RETROGRADE PYELOGRAM, URETEROSCOPY AND STENT PLACEMENT;   Surgeon: Franchot Gallo, MD;  Location[facility];  Service: Urology;  Laterality: Left;  . FOOT ARTHRODESIS Right 09/10/2019   Procedure: ARTHRODESIS FOOT;  Surgeon: Erle Crocker, MD;  Location: [place];  Service: Orthopedics;  Laterality: Right;  . IR URETERAL STENT PLACEMENT EXISTING ACCESS RIGHT  08-21-2007    dr Karsten Ro  [facility]  . LEFT RETROGRADE PYELOGRAM/ LEFT URETERAL STENT PLACEMENT  02-25-2001   dr Jeffie Pollock [facility]  . NEPHROLITHOTOMY Right 05/14/2016   Procedure: NEPHROLITHOTOMY PERCUTANEOUS;  Surgeon: Franchot Gallo, MD;  Location: [facility];  Service: Urology;  Laterality: Right;  . OPEN REDUCTION INTERNAL FIXATION (ORIF) FOOT LISFRANC FRACTURE Right 09/10/2019   Procedure: OPEN REDUCTION INTERNAL FIXATION (ORIF) FOOT LISFRANC FRACTURE;  Surgeon: Erle Crocker, MD;  Location: [place];  Service: Orthopedics;  Laterality: Right;  . REPAIR EXTENSOR TENDON WITH METATARSAL OSTEOTOMY AND OPEN REDUCTION IN Right 09/10/2019   Procedure: OPEN TREATMENT RIGHT FIRST, SECOND, THIRD TARSOMETATARSAL LISFRANC, MIDFOOT FUSION;  Surgeon: Erle Crocker, MD;  Location: [place];  Service: Orthopedics;  Laterality: Right;  . SHOULDER ARTHROSCOPY Bilateral left 11-13-2000;  right 02-18-2018   Social History   Socioeconomic History  . Marital status: Married    Spouse name: Not on file  . Number of children: 2  . Years of education: 2 years  of college  . Highest education level: Not on file  Occupational History  . Not on file  Tobacco Use  . Smoking status: Never Smoker  . Smokeless tobacco: Never Used  Vaping Use  . Vaping Use: Never used  Substance and Sexual Activity  . Alcohol use: No    Alcohol/week: 0.0 standard drinks  . Drug use: No  . Sexual activity: Not on file  Other Topics Concern  . Not on file  Social History Narrative   ** Merged History Encounter **       Social Determinants of Health    Financial Resource Strain: Not on file  Food Insecurity: Not on file  Transportation Needs: Not on file  Physical Activity: Not on file  Stress: Not on file  Social Connections: Not on file   Family History  Problem Relation Age of Onset  . Heart disease Father   . Diabetes Father    Allergies  Allergen Reactions  . Compazine Other (See Comments)    Bad headache, vomiting; IV   . Cymbalta [Duloxetine Hcl] Other (See Comments)    "Kept me awake"  . Methadone Hives  . Escitalopram Anxiety  . Macrobid [Nitrofurantoin Monohyd Macro] Rash   Prior to Admission medications   Medication Sig Start Date End Date Taking? Authorizing Provider  amLODipine (NORVASC) 5 MG tablet Take 1 tablet (5 mg total) by mouth daily. Patient taking differently: Take 5 mg by mouth daily with supper. 02/08/18  Yes Shawnee Knapp, MD  aspirin EC 81 MG tablet Take 1 tablet (81 mg total) by mouth daily. Swallow whole. 11/10/20  Yes McCue, Janett Billow, NP  atorvastatin (LIPITOR) 20 MG tablet Take 1 tablet (20 mg total) by mouth daily at 6 PM. 09/06/20  Yes Biby, Massie Kluver, NP  butorphanol (STADOL) 10 MG/ML nasal spray PLACE 1 SPRAY INTO THE NOSE EVERY 4 HOURS AS NEEDED FOR MIGRAINE. Patient taking differently: Place 1 spray into the nose every 4 (four) hours as needed for headache or migraine. PLACE 1 SPRAY INTO THE NOSE EVERY 4 HOURS AS NEEDED FOR MIGRAINE. 10/08/18  Yes Shawnee Knapp, MD  glipiZIDE (GLUCOTROL) 10 MG tablet Take 1 tablet (10 mg total) by mouth daily before breakfast. Patient taking differently: Take 20 mg by mouth 2 (two) times daily before a meal. 09/15/20  Yes Love, Pamela S, PA-C  insulin NPH Human (NOVOLIN N) 100 UNIT/ML injection Inject 20 Units into the skin in the morning and at bedtime.   Yes [provider]  levothyroxine (SYNTHROID, LEVOTHROID) 75 MCG tablet Take 1 tablet (75 mcg total) by mouth daily. 08/07/18  Yes Shawnee Knapp, MD  PHENobarbital (LUMINAL) 97.2 MG tablet TAKE 2 TABLETS BY  MOUTH ONCE DAILY ON MON,WED,AND FRI AND 1 TABLET DAILY ON OTHER DAYS Patient taking differently: Take 97.2-194.4 mg by mouth See admin instructions. TAKE 2 TABLETS (194.4 MG TOTALLY) BY MOUTH ON MON, FRI, SUN; TAKE 1 TABLET (97.2 MG TOTALLY) DAILY ON OTHER DAYS 08/07/18  Yes Shawnee Knapp, MD  tiZANidine (ZANAFLEX) 4 MG tablet TAKE 1 TABLET BY MOUTH EVERY 4-6 HOURS AS NEEDED FOR MUSCLE SPASM Patient taking differently: Take 4 mg by mouth every 4 (four) hours as needed for muscle spasms. 01/08/19  Yes Stallings, Zoe A, MD  zolpidem (AMBIEN) 10 MG tablet take 1 tablet by mouth at bedtime for sleep if needed. Patient taking differently: Take 10 mg by mouth at bedtime as needed for sleep. take 1 tablet by mouth at bedtime  for sleep if needed. 05/08/18  Yes Shawnee Knapp, MD  BD INSULIN SYRINGE U/F 31G X 5/16" 0.3 ML MISC Use 1 syringe as directed twice a day 09/27/20   [provider]  gabapentin (NEURONTIN) 100 MG capsule Take 1 capsule (100 mg total) by mouth at bedtime. 11/15/20   Kirsteins, Luanna Salk, MD     Positive ROS: All other systems have been reviewed and were otherwise negative with the exception of those mentioned in the HPI and as above.  Physical Exam:  Vitals:   12/27/20 0942  BP: (!) 169/64  Pulse: 74  Resp: 16  Temp: 98.2 F (36.8 C)  SpO2: 99%   General: Alert, no acute distress Cardiovascular: No pedal edema Respiratory: No cyanosis, no use of accessory musculature GI: No organomegaly, abdomen is soft and non-tender Skin: No lesions in the area of chief complaint Neurologic: Sensation intact distally Psychiatric: Patient is competent for consent with normal mood and affect Lymphatic: No axillary or cervical lymphadenopathy  MUSCULOSKELETAL: Right foot demonstrates well-healed surgical incision dorsally.  Mild swelling.  Palpable hardware dorsally.  Foot is warm and well-perfused distally with intact sensation.  Assessment: Symptomatic deep orthopedic hardware on the  right foot   Plan: Plan for hardware removal of the right foot.  We will place her in a soft dressing and she may weight-bear as tolerated after surgery.  She does have a walking boot at home and may use this for stability initially after surgery.  I did discuss preoperative nerve block with anesthesiologist and they will perform an ankle block for her.  We discussed the risks, benefits and alternatives of surgery which include but are not limited to wound healing complications, infection, nonunion, malunion, need for further surgery, damage to surrounding structures and continued pain.  They understand there is no guarantees to an acceptable outcome.  After weighing these risks they opted to proceed with surgery.     Erle Crocker, MD    12/27/2020 10:21 AM

## 2020-12-27 NOTE — Transfer of Care (Signed)
Immediate Anesthesia Transfer of Care Note  Patient: Natalie [name]  Procedure(s) Performed: DEEP ORTHOPEDIC HARDWARE REMOVAL, RIGHT FOOT (Right Foot)  Patient Location: PACU  Anesthesia Type:GA combined with regional for post-op pain  Level of Consciousness: drowsy  Airway & Oxygen Therapy: Patient Spontanous Breathing and Patient connected to face mask oxygen  Post-op Assessment: Report given to RN and Post -op Vital signs reviewed and stable  Post vital signs: Reviewed and stable  Last Vitals:  Vitals Value Taken Time  BP 132/73 12/27/20 1140  Temp    Pulse 76 12/27/20 1142  Resp 18 12/27/20 1142  SpO2 99 % 12/27/20 1142  Vitals shown include unvalidated device data.  Last Pain:  Vitals:   12/27/20 0942  TempSrc: Oral  PainSc: 6       Patients Stated Pain Goal: 6 ([id number])  Complications: No complications documented.

## 2020-12-27 NOTE — Discharge Instructions (Signed)
DR. Lucia Gaskins FOOT & ANKLE SURGERY POST-OP INSTRUCTIONS   Pain Management 1. The numbing medicine and your leg will last around 18 hours, take a dose of your pain medicine as soon as you feel it wearing off to avoid rebound pain. 2. Keep your foot elevated above heart level.  Make sure that your heel hangs free ('floats'). 3. Take all prescribed medication as directed. 4. If taking narcotic pain medication you may want to use an over-the-counter stool softener to avoid constipation. 5. You may take over-the-counter NSAIDs (ibuprofen, naproxen, etc.) as well as over-the-counter acetaminophen as directed on the packaging as a supplement for your pain and may also use it to wean away from the prescription medication.  Activity ? Okay to bear weight on the foot.  May use boot for stability. ? Keep dressing in place  First Postoperative Visit 1. Your first postop visit will be at least 2 weeks after surgery.  This should be scheduled when you schedule surgery. 2. If you do not have a postoperative visit scheduled please call [phone] to schedule an appointment. 3. At the appointment your incision will be evaluated for suture removal, x-rays will be obtained if necessary.  General Instructions 1. Swelling is very common after foot and ankle surgery.  It often takes 3 months for the foot and ankle to begin to feel comfortable.  Some amount of swelling will persist for 6-12 months. 2. DO NOT change the dressing.  If there is a problem with the dressing (too tight, loose, gets wet, etc.) please contact Dr. Pollie Friar office. 3. DO NOT get the dressing wet.  For showers you can use an over-the-counter cast cover or wrap a washcloth around the top of your dressing and then cover it with a plastic bag and tape it to your leg. 4. DO NOT soak the incision (no tubs, pools, bath, etc.) until you have approval from Dr. Lucia Gaskins.  Contact Dr. Huel Cote office or go to Emergency Room if: 1. Temperature above 101  F. 2. Increasing pain that is unresponsive to pain medication or elevation 3. Excessive redness or swelling in your foot 4. Dressing problems - excessive bloody drainage, looseness or tightness, or if dressing gets wet 5. Develop pain, swelling, warmth, or discoloration of your calf  May take Tylenol after 4pm, if needed.    Post Anesthesia Home Care Instructions  Activity: Get plenty of rest for the remainder of the day. A responsible individual must stay with you for 24 hours following the procedure.  For the next 24 hours, DO NOT: -Drive a car -Paediatric nurse -Drink alcoholic beverages -Take any medication unless instructed by your physician -Make any legal decisions or sign important papers.  Meals: Start with liquid foods such as gelatin or soup. Progress to regular foods as tolerated. Avoid greasy, spicy, heavy foods. If nausea and/or vomiting occur, drink only clear liquids until the nausea and/or vomiting subsides. Call your physician if vomiting continues.  Special Instructions/Symptoms: Your throat may feel dry or sore from the anesthesia or the breathing tube placed in your throat during surgery. If this causes discomfort, gargle with warm salt water. The discomfort should disappear within 24 hours.  If you had a scopolamine patch placed behind your ear for the management of post- operative nausea and/or vomiting:  1. The medication in the patch is effective for 72 hours, after which it should be removed.  Wrap patch in a tissue and discard in the trash. Wash hands thoroughly with soap and water.  2. You may remove the patch earlier than 72 hours if you experience unpleasant side effects which may include dry mouth, dizziness or visual disturbances. 3. Avoid touching the patch. Wash your hands with soap and water after contact with the patch.    Regional Anesthesia Blocks  1. Numbness or the inability to move the "blocked" extremity may last from 3-48 hours after  placement. The length of time depends on the medication injected and your individual response to the medication. If the numbness is not going away after 48 hours, call your surgeon.  2. The extremity that is blocked will need to be protected until the numbness is gone and the  Strength has returned. Because you cannot feel it, you will need to take extra care to avoid injury. Because it may be weak, you may have difficulty moving it or using it. You may not know what position it is in without looking at it while the block is in effect.  3. For blocks in the legs and feet, returning to weight bearing and walking needs to be done carefully. You will need to wait until the numbness is entirely gone and the strength has returned. You should be able to move your leg and foot normally before you try and bear weight or walk. You will need someone to be with you when you first try to ensure you do not fall and possibly risk injury.  4. Bruising and tenderness at the needle site are common side effects and will resolve in a few days.  5. Persistent numbness or new problems with movement should be communicated to the surgeon or the [facility] [phone] [place] [phone]).

## 2020-12-27 NOTE — Anesthesia Postprocedure Evaluation (Signed)
Anesthesia Post Note  Patient: Natalie [name]  Procedure(s) Performed: DEEP ORTHOPEDIC HARDWARE REMOVAL, RIGHT FOOT (Right Foot)     Patient location during evaluation: PACU Anesthesia Type: General Level of consciousness: awake and alert and oriented Pain management: pain level controlled Vital Signs Assessment: post-procedure vital signs reviewed and stable Respiratory status: spontaneous breathing, nonlabored ventilation and respiratory function stable Cardiovascular status: blood pressure returned to baseline Postop Assessment: no apparent nausea or vomiting Anesthetic complications: no   No complications documented.  Last Vitals:  Vitals:   12/27/20 1213 12/27/20 1215  BP:  128/65  Pulse:  68  Resp:  14  Temp:    SpO2: 94% 94%    Last Pain:  Vitals:   12/27/20 1200  TempSrc:   PainSc: [place]

## 2020-12-28 ENCOUNTER — Encounter (HOSPITAL_BASED_OUTPATIENT_CLINIC_OR_DEPARTMENT_OTHER): Payer: Self-pay | Admitting: Orthopaedic Surgery

## 2021-01-25 ENCOUNTER — Other Ambulatory Visit: Payer: Self-pay

## 2021-01-25 ENCOUNTER — Ambulatory Visit (INDEPENDENT_AMBULATORY_CARE_PROVIDER_SITE_OTHER): Payer: Medicare Other | Admitting: Adult Health

## 2021-01-25 ENCOUNTER — Encounter: Payer: Self-pay | Admitting: Adult Health

## 2021-01-25 VITALS — BP 151/91 | HR 72 | Ht <= 58 in | Wt 171.0 lb

## 2021-01-25 DIAGNOSIS — E785 Hyperlipidemia, unspecified: Secondary | ICD-10-CM

## 2021-01-25 DIAGNOSIS — E1142 Type 2 diabetes mellitus with diabetic polyneuropathy: Secondary | ICD-10-CM

## 2021-01-25 DIAGNOSIS — I10 Essential (primary) hypertension: Secondary | ICD-10-CM

## 2021-01-25 DIAGNOSIS — I619 Nontraumatic intracerebral hemorrhage, unspecified: Secondary | ICD-10-CM

## 2021-01-25 NOTE — Patient Instructions (Signed)
Continue aspirin 81 mg daily  and atorvastatin  for secondary stroke prevention  We will check your cholesterol and diabetic levels today  Continue to follow up with PCP regarding cholesterol, blood pressure and diabetes management  Maintain strict control of hypertension with blood pressure goal below 130/90, diabetes with hemoglobin A1c goal below 7% and cholesterol with LDL cholesterol (bad cholesterol) goal below 70 mg/dL.       Followup in the future with me in 6 months or call earlier if needed       Thank you for coming to see [place] at [facility]. I hope we have been able to provide you high quality care today.  You may receive a patient satisfaction survey over the next few weeks. We would appreciate your feedback and comments so that we may continue to improve ourselves and the health of our patients.

## 2021-01-25 NOTE — Progress Notes (Signed)
[facility] [address] [place]. [place] [address] [phone]       STROKE FOLLOW UP NOTE  Ms. [name] Date of Birth:  05-Feb-1951 Medical Record Number:  [id number]   Reason for Referral: stroke follow up    SUBJECTIVE:   CHIEF COMPLAINT:  Chief Complaint  Patient presents with  . Follow-up    TR with daughter (shelby) Pt is well, L side numbness/weakness    HPI:   Today, 01/25/2021, Ms. [name] returns for 72-monthstroke follow-up accompanied by her daughter and infant great granddaughter  Stable since prior visit without new stroke/TIA symptoms.  Residual left sided numbness/tingling (thigh to foot and hand). Possibly improving but denies worsening.  Was seen by Dr. KLetta Pate2/15 and placed on low-dose gabapentin but she self discontinued as she did not feel as though this was beneficial.  She declines interest in any further medication management She also continues to have gait instability but also recently underwent right foot hardware removal on [id number] complication -she has been slowly recovering from this.  Reports compliance on aspirin 81 mg daily and atorvastatin without associated side effects Blood pressure today 151/91 - monitors at home and typically lower Glucose levels stable at home per patient report She has not had any recent lab  No new concerns at this time    History provided for reference purposes only Initial visit 10/26/2020 JM: Ms. HGoosbyis being seen for hospital follow-up unaccompanied. She was discharged home from [facility] on 09/15/2020 with recommendation of [facility]. She reports residual left foot numbness and gait impairment. She also reports right foot fracture s/p surgical procedure 08/2020 with planned hardware removal tomorrow but due to recent stroke, procedure delayed.  She believes this is also be contributing to her gait impairment.  Denies new or worsening stroke/TIA symptoms. She remains on atorvastatin 20  mg daily without myalgias. Blood pressure today 158/78 - monitors at home which typically stable . Glucose levels stable at home. Reports repeat A1c with PCP which was at 7.9.  No concerns at this time.  Stroke admission 09/02/2020 Ms.Natalie B Hayesis a 70y.o.femalewith history of chronic headaches chronic migraine, chronic numbness BLE, chronic pain syndrome, CKD, hypertension, hyperlipidemia, type 2 diabetes,and seizures since the age of 89,[facility] with[facility] sided weakness and mild headache.Personally reviewed hospitalization pertinent progress notes, lab work and imaging with summary provided. Evaluated by Dr. SLeonie Manwith stroke work-up revealing acute right corona radiata IPH with mild surrounding edema and trace IVH, etiology likely hypertensive. History of HTN with long-term BP goal normotensive range. History of HLD on atorvastatin 40 mg daily with LDL 69 therefore decrease dosage to 20 mg daily in setting of recent IPH. History of DM with A1c 8.1 on insulin and glipizide. Other stroke risk factors include advanced age, obesity, migraines and prior strokes by imaging. Other active problems include CKD and seizure disorder on phenobarbital. Evaluated by therapies and discharged to CIR on 09/06/2020 for ongoing therapy needs and residual left-sided weakness and balance impairment.   Stroke: acuteRcorona radiataIPHwith mild surrounding edema and traceIVH-etiology likely hypertensive  CT head- 2 cc acute hematoma in the right corona radiata. There is early extension into the right lateral ventricle. Background chronic small vessel disease.   CT head repeat- Unchanged appearance of right basal ganglia intraparenchymal hemorrhage.   MRI head- Acute R corona radiata IPH with mild surrounding edema and trace IVH. No evidence of underlying lesion. Few chronic microhemorrhages. Mild  to moderate chronic microvascular ischemic changes. Chronic small vessel  infarcts  CTA H&N -no large vessel stenosis or occlusion no aneurysm or AVM.  2D Echo -EF60-65%. No source of embolus  [employer] Virus 2 - negative  LDL - 69  HgbA1c- 8.1  UDS -(not collected)  VTE prophylaxis - SCDs  No antithromboticprior to admission, now on No antithromboticICH  Therapy recommendations:CIR  Disposition: CIR      ROS:   14 system review of systems performed and negative with exception of those listed in HPI  PMH:  Past Medical History:  Diagnosis Date  . Arthritis   . Atrophy of left kidney   . Cervicalgia   . Chronic kidney disease, stage 3 ([place])    acute aki 03-14-2018 in setting pyelonehritis-- resolved  . Chronic migraine   . Chronic pain syndrome   . History of kidney stones   . History of pyelonephritis    hx recurrent  . History of recurrent UTIs   . History of sepsis    07/ 2017 secondary to pyelonephritis  . Hypertension   . Hypothyroidism   . Iron deficiency anemia   . Left ureteral stone   . Mixed hyperlipidemia   . Nephrolithiasis    right side nonobstructive per CT 03-14-2018  . Right shoulder pain    post sx 02-18-2018  . Seizure disorder ([place]) followed by pcp   per pt dx age 55--  no seizure since 1980's , controlled w/ phenobarbitol  . Stroke ([place]) 08/2020   no deficits  . Type 2 diabetes mellitus ([place])    followed by pcp  . Wears glasses     PSH:  Past Surgical History:  Procedure Laterality Date  . CESAREAN SECTION  1974  . CHOLECYSTECTOMY OPEN  2002  . CYSTO/  LEFT RETROGRADE PYELOGRAM  06-11-2006   dr Jeffie Pollock  [facility]  . CYSTOSCOPY WITH RETROGRADE PYELOGRAM, URETEROSCOPY AND STENT PLACEMENT Left 04/10/2018   Procedure: CYSTOSCOPY WITH RETROGRADE PYELOGRAM, URETEROSCOPY AND STENT PLACEMENT;  Surgeon: Franchot Gallo, MD;  [facility];  Service: Urology;  Laterality: Left;  . FOOT ARTHRODESIS Right 09/10/2019   Procedure: ARTHRODESIS FOOT;  Surgeon: Erle Crocker,  MD;  Location: [place];  Service: Orthopedics;  Laterality: Right;  . HARDWARE REMOVAL Right 12/27/2020   Procedure: DEEP ORTHOPEDIC HARDWARE REMOVAL, RIGHT FOOT;  Surgeon: Erle Crocker, MD;  Location: [place];  Service: Orthopedics;  Laterality: Right;  . IR URETERAL STENT PLACEMENT EXISTING ACCESS RIGHT  08-21-2007    dr Karsten Ro  [facility]  . LEFT RETROGRADE PYELOGRAM/ LEFT URETERAL STENT PLACEMENT  02-25-2001   dr Jeffie Pollock [facility]  . NEPHROLITHOTOMY Right 05/14/2016   Procedure: NEPHROLITHOTOMY PERCUTANEOUS;  Surgeon: Franchot Gallo, MD;  Location: [facility];  Service: Urology;  Laterality: Right;  . OPEN REDUCTION INTERNAL FIXATION (ORIF) FOOT LISFRANC FRACTURE Right 09/10/2019   Procedure: OPEN REDUCTION INTERNAL FIXATION (ORIF) FOOT LISFRANC FRACTURE;  Surgeon: Erle Crocker, MD;  Location: [place];  Service: Orthopedics;  Laterality: Right;  . REPAIR EXTENSOR TENDON WITH METATARSAL OSTEOTOMY AND OPEN REDUCTION IN Right 09/10/2019   Procedure: OPEN TREATMENT RIGHT FIRST, SECOND, THIRD TARSOMETATARSAL LISFRANC, MIDFOOT FUSION;  Surgeon: Erle Crocker, MD;  Location: [place];  Service: Orthopedics;  Laterality: Right;  . SHOULDER ARTHROSCOPY Bilateral left 11-13-2000;  right 02-18-2018    Social History:  Social History   Socioeconomic History  . Marital status: Married    Spouse  name: Not on file  . Number of children: 2  . Years of education: 2 years of college  . Highest education level: Not on file  Occupational History  . Not on file  Tobacco Use  . Smoking status: Never Smoker  . Smokeless tobacco: Never Used  Vaping Use  . Vaping Use: Never used  Substance and Sexual Activity  . Alcohol use: No    Alcohol/week: 0.0 standard drinks  . Drug use: No  . Sexual activity: Not on file  Other Topics Concern  . Not on file  Social History Narrative   ** Merged History Encounter **       Social  Determinants of Health   Financial Resource Strain: Not on file  Food Insecurity: Not on file  Transportation Needs: Not on file  Physical Activity: Not on file  Stress: Not on file  Social Connections: Not on file  Intimate Partner Violence: Not on file    Family History:  Family History  Problem Relation Age of Onset  . Heart disease Father   . Diabetes Father     Medications:   Current Outpatient Medications on File Prior to Visit  Medication Sig Dispense Refill  . amLODipine (NORVASC) 5 MG tablet Take 1 tablet (5 mg total) by mouth daily. (Patient taking differently: Take 5 mg by mouth daily with supper.) 90 tablet 3  . aspirin EC 81 MG tablet Take 1 tablet (81 mg total) by mouth daily. Swallow whole. 30 tablet 11  . atorvastatin (LIPITOR) 20 MG tablet Take 1 tablet (20 mg total) by mouth daily at 6 PM.    . BD INSULIN SYRINGE U/F 31G X 5/16" 0.3 ML MISC Use 1 syringe as directed twice a day    . butorphanol (STADOL) 10 MG/ML nasal spray PLACE 1 SPRAY INTO THE NOSE EVERY 4 HOURS AS NEEDED FOR MIGRAINE. (Patient taking differently: Place 1 spray into the nose every 4 (four) hours as needed for headache or migraine. PLACE 1 SPRAY INTO THE NOSE EVERY 4 HOURS AS NEEDED FOR MIGRAINE.) 2.5 mL 5  . glipiZIDE (GLUCOTROL) 10 MG tablet Take 1 tablet (10 mg total) by mouth daily before breakfast. (Patient taking differently: Take 20 mg by mouth 2 (two) times daily before a meal.)    . insulin NPH Human (NOVOLIN N) 100 UNIT/ML injection Inject 20 Units into the skin in the morning and at bedtime.    Marland Kitchen levothyroxine (SYNTHROID, LEVOTHROID) 75 MCG tablet Take 1 tablet (75 mcg total) by mouth daily. (Patient taking differently: Take 50 mcg by mouth daily.) 90 tablet 1  . PHENobarbital (LUMINAL) 97.2 MG tablet TAKE 2 TABLETS BY MOUTH ONCE DAILY ON MON,WED,AND FRI AND 1 TABLET DAILY ON OTHER DAYS (Patient taking differently: Take 97.2-194.4 mg by mouth See admin instructions. TAKE 2 TABLETS (194.4 MG  TOTALLY) BY MOUTH ON MON, FRI, SUN; TAKE 1 TABLET (97.2 MG TOTALLY) DAILY ON OTHER DAYS) 135 tablet 1  . tiZANidine (ZANAFLEX) 4 MG tablet TAKE 1 TABLET BY MOUTH EVERY 4-6 HOURS AS NEEDED FOR MUSCLE SPASM (Patient taking differently: Take 4 mg by mouth every 4 (four) hours as needed for muscle spasms.) 180 tablet 1  . zolpidem (AMBIEN) 10 MG tablet take 1 tablet by mouth at bedtime for sleep if needed. (Patient taking differently: Take 10 mg by mouth at bedtime as needed for sleep. take 1 tablet by mouth at bedtime for sleep if needed.) 90 tablet 1   No current facility-administered medications on file  prior to visit.    Allergies:   Allergies  Allergen Reactions  . Compazine Other (See Comments)    Bad headache, vomiting; IV   . Cymbalta [Duloxetine Hcl] Other (See Comments)    "Kept me awake"  . Methadone Hives  . Escitalopram Anxiety  . Macrobid [Nitrofurantoin Monohyd Macro] Rash      OBJECTIVE:  Physical Exam  Vitals:   01/25/21 1316  BP: (!) 151/91  Pulse: 72  Weight: 171 lb (77.6 kg)  Height: '4\' 8"'$  (1.422 m)   Body mass index is 38.34 kg/m. No exam data present  General: well developed, well nourished, very pleasant elderly Caucasian female, seated, in no evident distress Head: head normocephalic and atraumatic.   Neck: supple with no carotid or supraclavicular bruits Cardiovascular: regular rate and rhythm, no murmurs Musculoskeletal: no deformity Skin:  no rash/petichiae Vascular:  Normal pulses all extremities   Neurologic Exam Mental Status: Awake and fully alert. Fluent speech and language. Oriented to place and time. Recent and remote memory intact. Attention span, concentration and fund of knowledge appropriate. Mood and affect appropriate.  Cranial Nerves: Pupils equal, briskly reactive to light. Extraocular movements full without nystagmus. Visual fields full to confrontation. Hearing intact. Facial sensation intact. Face, tongue, palate moves normally  and symmetrically.  Motor: Normal bulk and tone. Normal strength in all tested extremity muscles Sensory.: reports altered light touch sensation LLE compared to RLE Coordination: Rapid alternating movements normal in all extremities. Finger-to-nose and heel-to-shin performed accurately bilaterally. Gait and Station: Arises from chair without difficulty. Stance is normal. Gait demonstrates normal stride length with mild unsteadiness/imbalance Reflexes: 1+ and symmetric. Toes downgoing.       ASSESSMENT: Natalie [name] is a 70 y.o. year old female presented with left-sided weakness and mild headache on 09/02/2020 with stroke work-up revealing acute right corona radiata IPH with mild surrounding edema and trace IVH, likely etiology hypertensive. Vascular risk factors include HTN, HLD, DM, obesity, migraines, seizure disorder and prior strokes on imaging.     PLAN:  1. R CR IPH :  a. Residual deficit: Left hand and leg paresthesias and gait unsteadiness. Declines interest in medication management for paresthesias. Encouraged continued exercises as tolerated  b. Continue atorvastatin 20 mg daily for secondary stroke prevention.   c. Discussed secondary stroke prevention measures and importance of close PCP follow up for aggressive stroke risk factor management  2. HTN: BP goal <130/90.  Stable on current regiment monitored by PCP 3. HLD: LDL goal <70. Recent LDL 69.  On atorvastatin 20 mg daily. Repeat lipid panel today 4. DMII: A1c goal<7.0. prior A1c 7.9 down from 8.1. repeat A1c today 5. Hx of R foot fracture: s/p surgical correction 09/10/2019 and s/p hardware removal 11/2020. Continue to follow with orthopedics    Follow up in 6 months or call earlier if needed   CC:  Reading provider: Dr. Herminio Heads, MD     Frann Rider, [facility]  [facility] [address] [place], [place] [address]  Phone [phone] Fax [phone] Note: This  document was prepared with digital dictation and possible smart phrase technology. Any transcriptional errors that result from this process are unintentional.

## 2021-01-26 ENCOUNTER — Telehealth: Payer: Self-pay

## 2021-01-26 ENCOUNTER — Other Ambulatory Visit: Payer: Self-pay | Admitting: Adult Health

## 2021-01-26 LAB — LIPID PANEL
Chol/HDL Ratio: 2.7 ratio (ref 0.0–4.4)
Cholesterol, Total: 190 mg/dL (ref 100–199)
HDL: 70 mg/dL (ref >39)
LDL Chol Calc (NIH): 97 mg/dL (ref 0–99)
Triglycerides: 132 mg/dL (ref 0–149)
VLDL Cholesterol Cal: 23 mg/dL (ref 5–40)

## 2021-01-26 LAB — HEMOGLOBIN A1C
Est. average glucose Bld gHb Est-mCnc: 183 mg/dL
Hgb A1c MFr Bld: 8 % — ABNORMAL HIGH (ref 4.8–5.6)

## 2021-01-26 MED ORDER — ATORVASTATIN CALCIUM 40 MG PO TABS: 40.0000 mg | ORAL_TABLET | Freq: Every day | ORAL | 3 refills | Status: DC

## 2021-01-26 NOTE — Telephone Encounter (Signed)
-----   Message from Frann Rider, NP sent at 01/26/2021  4:45 PM EDT ----- Recent lipid panel showed elevated LDL or bad cholesterol at 97 with a goal of <70 therefore recommend increasing atorvastatin 20 mg daily to 40 mg daily.  Recent A1c 8.0 which shows continued uncontrolled diabetes although lower from prior levels -highly recommend follow-up with PCP for further monitoring and management

## 2021-01-26 NOTE — Progress Notes (Signed)
I agree with the above plan 

## 2021-01-26 NOTE — Telephone Encounter (Signed)
Contacted pt to inform her that lipid panel showed elevated LDL or bad cholesterol at 97 with a goal of <70 therefore Natalie [name] recommend increasing atorvastatin 20 mg daily to 40 mg daily. Recent A1c 8.0 which shows continued uncontrolled diabetes although lower from prior levels and she highly recommend follow-up with PCP for further monitoring and management. Advised to call the office with questions, she was pleased and understood.

## 2021-01-26 NOTE — Progress Notes (Signed)
See telephone note from 01/26/21

## 2021-04-06 ENCOUNTER — Other Ambulatory Visit: Payer: Self-pay | Admitting: Internal Medicine

## 2021-04-06 DIAGNOSIS — Z1231 Encounter for screening mammogram for malignant neoplasm of breast: Secondary | ICD-10-CM

## 2021-05-16 ENCOUNTER — Encounter: Payer: Self-pay | Admitting: Nurse Practitioner

## 2021-06-15 ENCOUNTER — Encounter: Payer: Self-pay | Admitting: Nurse Practitioner

## 2021-06-15 ENCOUNTER — Ambulatory Visit (INDEPENDENT_AMBULATORY_CARE_PROVIDER_SITE_OTHER): Payer: Medicare Other | Admitting: Nurse Practitioner

## 2021-06-15 ENCOUNTER — Other Ambulatory Visit (INDEPENDENT_AMBULATORY_CARE_PROVIDER_SITE_OTHER): Payer: Medicare Other

## 2021-06-15 VITALS — BP 146/72 | HR 75 | Ht <= 58 in | Wt 162.0 lb

## 2021-06-15 DIAGNOSIS — Z1211 Encounter for screening for malignant neoplasm of colon: Secondary | ICD-10-CM

## 2021-06-15 DIAGNOSIS — D509 Iron deficiency anemia, unspecified: Secondary | ICD-10-CM

## 2021-06-15 DIAGNOSIS — N184 Chronic kidney disease, stage 4 (severe): Secondary | ICD-10-CM | POA: Diagnosis not present

## 2021-06-15 LAB — CBC
HCT: 34.5 % — ABNORMAL LOW (ref 36.0–46.0)
Hemoglobin: 11 g/dL — ABNORMAL LOW (ref 12.0–15.0)
MCHC: 31.9 g/dL (ref 30.0–36.0)
MCV: 85.1 fl (ref 78.0–100.0)
Platelets: 260 10*3/uL (ref 150.0–400.0)
RBC: 4.05 Mil/uL (ref 3.87–5.11)
RDW: 16.9 % — ABNORMAL HIGH (ref 11.5–15.5)
WBC: 6.5 10*3/uL (ref 4.0–10.5)

## 2021-06-15 LAB — IBC + FERRITIN
Ferritin: 7.2 ng/mL — ABNORMAL LOW (ref 10.0–291.0)
Iron: 36 ug/dL — ABNORMAL LOW (ref 42–145)
Saturation Ratios: 7.8 % — ABNORMAL LOW (ref 20.0–50.0)
TIBC: 463.4 ug/dL — ABNORMAL HIGH (ref 250.0–450.0)
Transferrin: 331 mg/dL (ref 212.0–360.0)

## 2021-06-15 NOTE — Progress Notes (Addendum)
06/15/2021 Natalie [name] [id number] 07-26-1951   CHIEF COMPLAINT: Schedule a colonoscopy  HISTORY OF PRESENT ILLNESS: Natalie [name] is a 70 year old female with a past medical history of arthritis, hypertension, migraine headaches, seizure disorder diagnosed at the age of 44 ( no seizure activity since the 1980's) on Phenobarbital, CVA (intracerebral hemorrhage)08/2020, diabetes mellitus type 2, CKD stage III, kidney stones, nephritis, IDA and hypothyroidism. Past C section, cholecystectomy and right foot surgery 12/20220 with hardware removal 11/2020.  She presents to our office today as referred by Dr. [name] to schedule a screening colonoscopy  She underwent negative Cologuard test in 2019.  She denies ever having a screening colonoscopy.  She denies having any upper or lower abdominal pain.  She is passing normal formed brown bowel movement most days.  No rectal bleeding or black stools.  No GERD symptoms.  She has a history of iron deficiency anemia, etiology unknown.  She suffered from a CVA 08/2020 (small ICH with mild IVH extension in the right hemisphere, likely hypertensive bleed) with mild visual weakness to her left arm and leg.  She remains on ASA 81 mg and atorvastatin 40 mg daily.  Hypertension is well controlled on Amlodipine 5 mg daily.  No known family history of colorectal cancer.  No complaints today.  CBC Latest Ref Rng & Units 09/12/2020 09/07/2020 09/06/2020  WBC 4.0 - 10.5 K/uL 7.1 5.8 5.9  Hemoglobin 12.0 - 15.0 g/dL 10.3(L) 10.2(L) 10.4(L)  Hematocrit 36.0 - 46.0 % 32.9(L) 31.3(L) 33.4(L)  Platelets 150 - 400 K/uL 212 205 224    CMP Latest Ref Rng & Units 12/02/2020 09/12/2020 09/07/2020  Glucose 70 - 99 mg/dL 107(H) 174(H) 156(H)  BUN 8 - 23 mg/dL 27(H) 26(H) 25(H)  Creatinine 0.44 - 1.00 mg/dL 1.45(H) 1.11(H) 1.21(H)  Sodium 135 - 145 mmol/L 138 139 140  Potassium 3.5 - 5.1 mmol/L 5.1 3.8 3.6  Chloride 98 - 111 mmol/L 106 106 105  CO2 22 - 32 mmol/L '23 24 25   '$ Calcium 8.9 - 10.3 mg/dL 10.1 9.4 9.5  Total Protein 6.5 - 8.1 g/dL - - 6.0(L)  Total Bilirubin 0.3 - 1.2 mg/dL - - 0.5  Alkaline Phos 38 - 126 U/L - - 74  AST 15 - 41 U/L - - 17  ALT 0 - 44 U/L - - 17    ECHO 09/04/2020: 1. Left ventricular ejection fraction, by estimation, is 60 to 65%. The left ventricle has normal function. Left ventricular endocardial border not optimally defined to evaluate regional wall motion. Left ventricular diastolic parameters are consistent with Grade I diastolic dysfunction (impaired relaxation). 2. Right ventricular systolic function is normal. The right ventricular size is normal. Tricuspid regurgitation signal is inadequate for assessing PA pressure. 3. The mitral valve is normal in structure. No evidence of mitral valve regurgitation. No evidence of mitral stenosis. 4. The aortic valve is grossly normal. Aortic valve regurgitation is not visualized. No aortic stenosis is present. 5. The inferior vena cava is normal in size with greater than 50% respiratory variability, suggesting right atrial pressure of 3 mmHg.  Past Medical History:  Diagnosis Date   Arthritis    Atrophy of left kidney    Cervicalgia    Chronic kidney disease, stage 3 ([place])    acute aki 03-14-2018 in setting pyelonehritis-- resolved   Chronic migraine    Chronic pain syndrome    History of kidney stones    History of pyelonephritis    hx recurrent  History of recurrent UTIs    History of sepsis    07/ 2017 secondary to pyelonephritis   Hypertension    Hypothyroidism    Iron deficiency anemia    Left ureteral stone    Mixed hyperlipidemia    Nephrolithiasis    right side nonobstructive per CT 03-14-2018   Right shoulder pain    post sx 02-18-2018   Seizure disorder ([place]) followed by pcp   per pt dx age 84--  no seizure since 1980's , controlled w/ phenobarbitol   Stroke ([place]) 08/2020   no deficits   Type 2 diabetes mellitus ([place])    followed by pcp   Wears  glasses    Past Surgical History:  Procedure Laterality Date   CESAREAN SECTION  1974   CHOLECYSTECTOMY OPEN  2002   CYSTO/  LEFT RETROGRADE PYELOGRAM  06-11-2006   dr Jeffie Pollock  [place], URETEROSCOPY AND STENT PLACEMENT Left 04/10/2018   Procedure: CYSTOSCOPY WITH RETROGRADE PYELOGRAM, URETEROSCOPY AND STENT PLACEMENT;  Surgeon: Franchot Gallo, MD;  Location[facility];  Service: Urology;  Laterality: Left;   FOOT ARTHRODESIS Right 09/10/2019   Procedure: ARTHRODESIS FOOT;  Surgeon: Erle Crocker, MD;  Location: [place];  Service: Orthopedics;  Laterality: Right;   HARDWARE REMOVAL Right 12/27/2020   Procedure: DEEP ORTHOPEDIC HARDWARE REMOVAL, RIGHT FOOT;  Surgeon: Erle Crocker, MD;  Location: [place];  Service: Orthopedics;  Laterality: Right;   IR URETERAL STENT PLACEMENT EXISTING ACCESS RIGHT  08-21-2007    dr Karsten Ro  [facility]   LEFT RETROGRADE PYELOGRAM/ LEFT URETERAL STENT PLACEMENT  02-25-2001   dr Jeffie Pollock [facility] [place]   NEPHROLITHOTOMY Right 05/14/2016   Procedure: NEPHROLITHOTOMY PERCUTANEOUS;  Surgeon: Franchot Gallo, MD;  Location: [facility];  Service: Urology;  Laterality: Right;   OPEN REDUCTION INTERNAL FIXATION (ORIF) FOOT LISFRANC FRACTURE Right 09/10/2019   Procedure: OPEN REDUCTION INTERNAL FIXATION (ORIF) FOOT LISFRANC FRACTURE;  Surgeon: Erle Crocker, MD;  Location: [place];  Service: Orthopedics;  Laterality: Right;   REPAIR EXTENSOR TENDON WITH METATARSAL OSTEOTOMY AND OPEN REDUCTION IN Right 09/10/2019   Procedure: OPEN TREATMENT RIGHT FIRST, SECOND, THIRD TARSOMETATARSAL LISFRANC, MIDFOOT FUSION;  Surgeon: Erle Crocker, MD;  Location: [place];  Service: Orthopedics;  Laterality: Right;   SHOULDER ARTHROSCOPY Bilateral left 11-13-2000;  right 02-18-2018   Social History: She is married.  She has 1 son and 1 daughter.  She is a  non-smoker.  No alcohol use.  No drug use.  Family History: Father with history of DM and heart disease.  Mother died from pneumonia.  No known family history of esophageal, gastric or colon cancer.   Allergies  Allergen Reactions   Compazine Other (See Comments)    Bad headache, vomiting; IV    Cymbalta [Duloxetine Hcl] Other (See Comments)    "Kept me awake"   Methadone Hives   Escitalopram Anxiety   Macrobid [Nitrofurantoin Monohyd Macro] Rash      Outpatient Encounter Medications as of 06/15/2021  Medication Sig   amLODipine (NORVASC) 5 MG tablet Take 1 tablet (5 mg total) by mouth daily. (Patient taking differently: Take 5 mg by mouth daily with supper.)   aspirin EC 81 MG tablet Take 1 tablet (81 mg total) by mouth daily. Swallow whole.   atorvastatin (LIPITOR) 40 MG tablet Take 1 tablet (40 mg total) by mouth daily.   BD INSULIN SYRINGE U/F 31G X 5/16"  0.3 ML MISC Use 1 syringe as directed twice a day   butorphanol (STADOL) 10 MG/ML nasal spray PLACE 1 SPRAY INTO THE NOSE EVERY 4 HOURS AS NEEDED FOR MIGRAINE. (Patient taking differently: Place 1 spray into the nose every 4 (four) hours as needed for headache or migraine. PLACE 1 SPRAY INTO THE NOSE EVERY 4 HOURS AS NEEDED FOR MIGRAINE.)   glipiZIDE (GLUCOTROL) 10 MG tablet Take 1 tablet (10 mg total) by mouth daily before breakfast. (Patient taking differently: Take 20 mg by mouth 2 (two) times daily before a meal.)   insulin NPH Human (NOVOLIN N) 100 UNIT/ML injection Inject 20 Units into the skin in the morning and at bedtime.   levothyroxine (SYNTHROID, LEVOTHROID) 75 MCG tablet Take 1 tablet (75 mcg total) by mouth daily. (Patient taking differently: Take 50 mcg by mouth daily.)   PHENobarbital (LUMINAL) 97.2 MG tablet TAKE 2 TABLETS BY MOUTH ONCE DAILY ON MON,WED,AND FRI AND 1 TABLET DAILY ON OTHER DAYS (Patient taking differently: Take 97.2-194.4 mg by mouth See admin instructions. TAKE 2 TABLETS (194.4 MG TOTALLY) BY MOUTH ON  MON, FRI, SUN; TAKE 1 TABLET (97.2 MG TOTALLY) DAILY ON OTHER DAYS)   tiZANidine (ZANAFLEX) 4 MG tablet TAKE 1 TABLET BY MOUTH EVERY 4-6 HOURS AS NEEDED FOR MUSCLE SPASM (Patient taking differently: Take 4 mg by mouth every 4 (four) hours as needed for muscle spasms.)   zolpidem (AMBIEN) 10 MG tablet take 1 tablet by mouth at bedtime for sleep if needed. (Patient taking differently: Take 10 mg by mouth at bedtime as needed for sleep. take 1 tablet by mouth at bedtime for sleep if needed.)   No facility-administered encounter medications on file as of 06/15/2021.    REVIEW OF SYSTEMS:  Gen: Denies fever, sweats or chills. No weight loss.  CV: Denies chest pain, palpitations or edema. Resp: Denies cough, shortness of breath of hemoptysis.  GI: Denies heartburn, dysphagia, stomach or lower abdominal pain. No diarrhea or constipation.  GU : Denies urinary burning, blood in urine, increased urinary frequency or incontinence. MS: Denies having any new joint pain.  Derm: Denies rash, itchiness, skin lesions or unhealing ulcers. Psych: Denies depression, anxiety or memory loss. Heme: Denies bruising, bleeding. Neuro:  + Mild LUE/LLE weakness.  Endo:  + DM II.   PHYSICAL EXAM: BP (!) 146/72   Pulse 75   Ht '4\' 8"'$  (1.422 m)   Wt 162 lb (73.5 kg)   BMI 36.32 kg/m  Wt Readings from Last 3 Encounters:  06/15/21 162 lb (73.5 kg)  01/25/21 171 lb (77.6 kg)  12/27/20 171 lb 8.3 oz (77.8 kg)    General: Pale appearing 70 year old female in no acute distress. Head: Normocephalic and atraumatic. Eyes:  Sclerae non-icteric, conjunctive pink. Ears: Normal auditory acuity. Mouth: Dentition intact. No ulcers or lesions.  Neck: Supple, no lymphadenopathy or thyromegaly.  Lungs: Clear bilaterally to auscultation without wheezes, crackles or rhonchi. Heart: Regular rate and rhythm. No murmur, rub or gallop appreciated.  Abdomen: Soft, nontender, non distended. No masses. No hepatosplenomegaly.  Normoactive bowel sounds x 4 quadrants.  Rectal: Deferred. Musculoskeletal: Symmetrical with no gross deformities. Skin: Warm and dry. No rash or lesions on visible extremities. Extremities: No edema. Neurological: Alert oriented x 4, no focal deficits.  Psychological:  Alert and cooperative. Normal mood and affect.  ASSESSMENT AND PLAN:  28) 70 year old female presents for to schedule screening colonoscopy.  Negative Cologuard 2019. -Colonoscopy benefits and risks discussed including risk with sedation, risk of  bleeding, perforation and infection   2) Chronic anemia. History of IDA.  -CBC, iron panel, TTG, IGA -Consider EGD at the time of colonoscopy if the above lab results show evidence of iron deficiency  3) CKD stage III  4)  S/P CVA (intracerebral hemorrhage)08/2020  5) Remote history of seizure disorder stable on Phenobarbital  6) DM II  Further recommendations to be determined after the above evaluation completed   ADDENDUM: Labs 06/15/2021 confirmed IDA. EGD to be done at the time of colonoscopy.   CC:  Buzzy Han*

## 2021-06-15 NOTE — Patient Instructions (Signed)
PROCEDURES: You have been scheduled for a colonoscopy. Please follow the written instructions given to you at your visit today. If you use inhalers (even only as needed), please bring them with you on the day of your procedure.  LABS:  Lab work has been ordered for you today. Our lab is located in the basement. Press "B" on the elevator. The lab is located at the first door on the left as you exit the elevator.  HEALTHCARE LAWS AND MY CHART RESULTS: Due to recent changes in healthcare laws, you may see the results of your imaging and laboratory studies on MyChart before your provider has had a chance to review them.   We understand that in some cases there may be results that are confusing or concerning to you. Not all laboratory results come back in the same time frame and the provider may be waiting for multiple results in order to interpret others.  Please give [place] 48 hours in order for your provider to thoroughly review all the results before contacting the office for clarification of your results.   It was great seeing you today! Thank you for entrusting me with your care and choosing [facility].  Noralyn Pick, CRNP  The [facility] providers would like to encourage you to use [facility] to communicate with providers for non-urgent requests or questions.  Due to long hold times on the telephone, sending your provider a message by [facility] may be faster and more efficient way to get a response. Please allow 48 business hours for a response.  Please remember that this is for non-urgent requests/questions.  If you are age 72 or older, your body mass index should be between 23-30. Your Body mass index is 36.32 kg/m. If this is out of the aforementioned range listed, please consider follow up with your Primary Care Provider.  If you are age 72 or younger, your body mass index should be between 19-25. Your Body mass index is 36.32 kg/m. If this is out of the aformentioned range  listed, please consider follow up with your Primary Care Provider.

## 2021-06-16 LAB — IGA: Immunoglobulin A: 88 mg/dL (ref 70–320)

## 2021-06-16 LAB — TISSUE TRANSGLUTAMINASE ABS,IGG,IGA
(tTG) Ab, IgA: <1 U/mL
(tTG) Ab, IgG: 3.7 U/mL

## 2021-06-21 ENCOUNTER — Other Ambulatory Visit: Payer: Self-pay

## 2021-06-21 DIAGNOSIS — D509 Iron deficiency anemia, unspecified: Secondary | ICD-10-CM

## 2021-06-26 ENCOUNTER — Encounter: Payer: Self-pay | Admitting: Gastroenterology

## 2021-06-26 ENCOUNTER — Ambulatory Visit (AMBULATORY_SURGERY_CENTER): Payer: Medicare Other | Admitting: Gastroenterology

## 2021-06-26 ENCOUNTER — Other Ambulatory Visit: Payer: Self-pay

## 2021-06-26 VITALS — BP 122/62 | HR 68 | Temp 97.8°F | Resp 16 | Ht <= 58 in | Wt 162.0 lb

## 2021-06-26 DIAGNOSIS — Z1211 Encounter for screening for malignant neoplasm of colon: Secondary | ICD-10-CM

## 2021-06-26 DIAGNOSIS — K279 Peptic ulcer, site unspecified, unspecified as acute or chronic, without hemorrhage or perforation: Secondary | ICD-10-CM

## 2021-06-26 DIAGNOSIS — K3189 Other diseases of stomach and duodenum: Secondary | ICD-10-CM | POA: Diagnosis not present

## 2021-06-26 DIAGNOSIS — K259 Gastric ulcer, unspecified as acute or chronic, without hemorrhage or perforation: Secondary | ICD-10-CM | POA: Diagnosis not present

## 2021-06-26 DIAGNOSIS — K269 Duodenal ulcer, unspecified as acute or chronic, without hemorrhage or perforation: Secondary | ICD-10-CM

## 2021-06-26 DIAGNOSIS — K5520 Angiodysplasia of colon without hemorrhage: Secondary | ICD-10-CM | POA: Diagnosis not present

## 2021-06-26 DIAGNOSIS — K449 Diaphragmatic hernia without obstruction or gangrene: Secondary | ICD-10-CM

## 2021-06-26 DIAGNOSIS — Q2730 Arteriovenous malformation, site unspecified: Secondary | ICD-10-CM

## 2021-06-26 DIAGNOSIS — K2970 Gastritis, unspecified, without bleeding: Secondary | ICD-10-CM | POA: Diagnosis not present

## 2021-06-26 DIAGNOSIS — D509 Iron deficiency anemia, unspecified: Secondary | ICD-10-CM

## 2021-06-26 DIAGNOSIS — K2100 Gastro-esophageal reflux disease with esophagitis, without bleeding: Secondary | ICD-10-CM | POA: Diagnosis not present

## 2021-06-26 MED ORDER — SODIUM CHLORIDE 0.9 % IV SOLN: 500.0000 mL | Freq: Once | INTRAVENOUS | Status: DC

## 2021-06-26 NOTE — Progress Notes (Signed)
Pt's states no medical or surgical changes since previsit or office visit.  ° °Vitals CW °

## 2021-06-26 NOTE — Patient Instructions (Signed)
Please read handouts provided. Continue present medications. Await pathology results.     YOU HAD AN ENDOSCOPIC PROCEDURE TODAY AT THE [facility]:   Refer to the procedure report that was given to you for any specific questions about what was found during the examination.  If the procedure report does not answer your questions, please call your gastroenterologist to clarify.  If you requested that your care partner not be given the details of your procedure findings, then the procedure report has been included in a sealed envelope for you to review at your convenience later.  YOU SHOULD EXPECT: Some feelings of bloating in the abdomen. Passage of more gas than usual.  Walking can help get rid of the air that was put into your GI tract during the procedure and reduce the bloating. If you had a lower endoscopy (such as a colonoscopy or flexible sigmoidoscopy) you may notice spotting of blood in your stool or on the toilet paper. If you underwent a bowel prep for your procedure, you may not have a normal bowel movement for a few days.  Please Note:  You might notice some irritation and congestion in your nose or some drainage.  This is from the oxygen used during your procedure.  There is no need for concern and it should clear up in a day or so.  SYMPTOMS TO REPORT IMMEDIATELY:   Following lower endoscopy (colonoscopy or flexible sigmoidoscopy):  Excessive amounts of blood in the stool  Significant tenderness or worsening of abdominal pains  Swelling of the abdomen that is new, acute  Fever of 100F or higher   Following upper endoscopy (EGD)  Vomiting of blood or coffee ground material  New chest pain or pain under the shoulder blades  Painful or persistently difficult swallowing  New shortness of breath  Fever of 100F or higher  Black, tarry-looking stools  For urgent or emergent issues, a gastroenterologist can be reached at any hour by calling [phone]. Do not  use MyChart messaging for urgent concerns.    DIET:  We do recommend a small meal at first, but then you may proceed to your regular diet.  Drink plenty of fluids but you should avoid alcoholic beverages for 24 hours.  ACTIVITY:  You should plan to take it easy for the rest of today and you should NOT DRIVE or use heavy machinery until tomorrow (because of the sedation medicines used during the test).    FOLLOW UP: Our staff will call the number listed on your records 48-72 hours following your procedure to check on you and address any questions or concerns that you may have regarding the information given to you following your procedure. If we do not reach you, we will leave a message.  We will attempt to reach you two times.  During this call, we will ask if you have developed any symptoms of COVID 19. If you develop any symptoms (ie: fever, flu-like symptoms, shortness of breath, cough etc.) before then, please call [phone].  If you test positive for Covid 19 in the 2 weeks post procedure, please call and report this information to us.    If any biopsies were taken you will be contacted by phone or by letter within the next 1-3 weeks.  Please call us at [phone] if you have not heard about the biopsies in 3 weeks.    SIGNATURES/CONFIDENTIALITY: You and/or your care partner have signed paperwork which will be entered into your electronic medical   record.  These signatures attest to the fact that that the information above on your After Visit Summary has been reviewed and is understood.  Full responsibility of the confidentiality of this discharge information lies with you and/or your care-partner. 

## 2021-06-26 NOTE — Op Note (Signed)
[facility] Patient Name: Natalie [name] Procedure Date: 06/26/2021 1:29 PM MRN: [id number] Endoscopist: Mauri Pole , MD Age: 70 Referring MD:  Date of Birth: 08/07/1951 Gender: Female Account #: [id number] Procedure:                Upper GI endoscopy Indications:              Suspected upper gastrointestinal bleeding in                            patient with unexplained iron deficiency anemia Medicines:                Monitored Anesthesia Care Procedure:                Pre-Anesthesia Assessment:                           - Prior to the procedure, a History and Physical                            was performed, and patient medications and                            allergies were reviewed. The patient's tolerance of                            previous anesthesia was also reviewed. The risks                            and benefits of the procedure and the sedation                            options and risks were discussed with the patient.                            All questions were answered, and informed consent                            was obtained. Prior Anticoagulants: The patient has                            taken no previous anticoagulant or antiplatelet                            agents. ASA Grade Assessment: III - A patient with                            severe systemic disease. After reviewing the risks                            and benefits, the patient was deemed in                            satisfactory condition to undergo the procedure.  After obtaining informed consent, the endoscope was                            passed under direct vision. Throughout the                            procedure, the patient's blood pressure, pulse, and                            oxygen saturations were monitored continuously. The                            GIF [id number] was introduced through the                            mouth, and  advanced to the second part of duodenum.                            The upper GI endoscopy was accomplished without                            difficulty. The patient tolerated the procedure                            well. Scope In: Scope Out: Findings:                 LA Grade B (one or more mucosal breaks greater than                            5 mm, not extending between the tops of two mucosal                            folds) esophagitis with no bleeding was found 34 to                            35 cm from the incisors.                           A large hiatal hernia was present.                           The exam of the esophagus was otherwise normal.                           Patchy mild inflammation characterized by                            congestion (edema), erosions, erythema and shallow                            ulcerations was found in the entire examined  stomach. Biopsies were taken with a cold forceps                            for Helicobacter pylori testing.                           One non-bleeding cratered gastric ulcer with no                            stigmata of bleeding was found at the pylorus. The                            lesion was less than one mm in largest dimension.                            Biopsies were taken with a cold forceps for                            histology.                           Few non-obstructing non-bleeding superficial                            duodenal ulcers with no stigmata of bleeding were                            found in the duodenal bulb. The largest lesion was                            3 mm in largest dimension. Complications:            No immediate complications. Estimated Blood Loss:     Estimated blood loss was minimal. Impression:               - LA Grade B reflux esophagitis with no bleeding.                           - Large hiatal hernia.                           - Gastritis.  Biopsied.                           - Non-bleeding gastric ulcer with no stigmata of                            bleeding. Biopsied.                           - Non-obstructing non-bleeding duodenal ulcers with                            no stigmata of bleeding. Recommendation:           - Resume previous diet.                           -  Continue present medications.                           - Await pathology results.                           - See the other procedure note for documentation of                            additional recommendations. Mauri Pole, MD 06/26/2021 2:22:44 PM This report has been signed electronically.

## 2021-06-26 NOTE — Progress Notes (Signed)
A and O x3. Report to RN. Tolerated MAC anesthesia well.Teeth unchanged after procedure. 

## 2021-06-26 NOTE — Progress Notes (Signed)
Called to room to assist during endoscopic procedure.  Patient ID and intended procedure confirmed with present staff. Received instructions for my participation in the procedure from the performing physician.  

## 2021-06-26 NOTE — Progress Notes (Signed)
Low ferritin 7.2 with severe iron deficiency anemia Plan to proceed with EGD and colonoscopy for further evaluation of iron deficiency anemia, exclude source of GI blood loss/neoplasia Please refer to office visit note 06/15/21 for details. No additional changes in H&P Patient is appropriate for planned procedure(s) and anesthesia in an ambulatory setting  K. Denzil Magnuson , MD [phone]

## 2021-06-26 NOTE — Op Note (Signed)
[facility] Patient Name: Clancy Lay Procedure Date: 06/26/2021 1:29 PM MRN: [id number] Endoscopist: Mauri Pole , MD Age: 70 Referring MD:  Date of Birth: 02/23/1951 Gender: Female Account #: [id number] Procedure:                Colonoscopy Indications:              Unexplained iron deficiency anemia Medicines:                Monitored Anesthesia Care Procedure:                Pre-Anesthesia Assessment:                           - Prior to the procedure, a History and Physical                            was performed, and patient medications and                            allergies were reviewed. The patient's tolerance of                            previous anesthesia was also reviewed. The risks                            and benefits of the procedure and the sedation                            options and risks were discussed with the patient.                            All questions were answered, and informed consent                            was obtained. Prior Anticoagulants: The patient has                            taken no previous anticoagulant or antiplatelet                            agents. ASA Grade Assessment: III - A patient with                            severe systemic disease. After reviewing the risks                            and benefits, the patient was deemed in                            satisfactory condition to undergo the procedure.                           After obtaining informed consent, the colonoscope  was passed under direct vision. Throughout the                            procedure, the patient's blood pressure, pulse, and                            oxygen saturations were monitored continuously. The                            PCF-HQ190L Colonoscope was introduced through the                            anus and advanced to the the cecum, identified by                            appendiceal orifice  and ileocecal valve. The                            colonoscopy was performed without difficulty. The                            patient tolerated the procedure well. The quality                            of the bowel preparation was excellent. The                            ileocecal valve, appendiceal orifice, and rectum                            were photographed. Scope In: 1:51:27 PM Scope Out: 2:12:10 PM Scope Withdrawal Time: 0 hours 10 minutes 14 seconds  Total Procedure Duration: 0 hours 20 minutes 43 seconds  Findings:                 The perianal and digital rectal examinations were                            normal.                           A single small angiodysplastic lesion with typical                            arborization was found in the cecum. Cautery for                            hemostasis using snare tip was successful. For                            hemostasis, one hemostatic clip was successfully                            placed (MR conditional). There was no bleeding at  the end of the procedure.                           Multiple small and large-mouthed diverticula were                            found in the sigmoid colon, descending colon and                            transverse colon. There was narrowing of the colon                            in association with the diverticular opening. There                            was evidence of diverticular spasm.                           Non-bleeding external and internal hemorrhoids were                            found during retroflexion. The hemorrhoids were                            medium-sized. Complications:            No immediate complications. Estimated Blood Loss:     Estimated blood loss was minimal. Impression:               - A single colonic angiodysplastic lesion. Treated                            with a hot snare. Clip (MR conditional) was placed.                            - Moderate diverticulosis in the sigmoid colon, in                            the descending colon and in the transverse colon.                            There was narrowing of the colon in association                            with the diverticular opening. There was evidence                            of diverticular spasm.                           - Non-bleeding external and internal hemorrhoids.                           - No specimens collected. Recommendation:           - Patient has a contact number available  for                            emergencies. The signs and symptoms of potential                            delayed complications were discussed with the                            patient. Return to normal activities tomorrow.                            Written discharge instructions were provided to the                            patient.                           - Resume previous diet.                           - Continue present medications.                           - No repeat colonoscopy due to age. Mauri Pole, MD 06/26/2021 2:27:21 PM This report has been signed electronically.

## 2021-06-26 NOTE — Progress Notes (Signed)
Reviewed and agree with documentation and assessment and plan. K. Veena Keaton Stirewalt , MD   

## 2021-06-28 ENCOUNTER — Telehealth: Payer: Self-pay | Admitting: *Deleted

## 2021-06-28 NOTE — Telephone Encounter (Signed)
  Follow up Call-  Call back number 06/26/2021  Post procedure Call Back phone  # [phone]  Permission to leave phone message Yes  Some recent data might be hidden     Patient questions:  Do you have a fever, pain , or abdominal swelling? No. Pain Score  0 *  Have you tolerated food without any problems? Yes.    Have you been able to return to your normal activities? Yes.    Do you have any questions about your discharge instructions: Diet   No. Medications  No. Follow up visit  No.  Do you have questions or concerns about your Care? No.  Actions: * If pain score is 4 or above: No action needed, pain <4.  Have you developed a fever since your procedure? no  2.   Have you had an respiratory symptoms (SOB or cough) since your procedure? no  3.   Have you tested positive for COVID 19 since your procedure no  4.   Have you had any family members/close contacts diagnosed with the COVID 19 since your procedure?  no   If yes to any of these questions please route to Joylene John, RN and Joella Prince, RN

## 2021-06-28 NOTE — Telephone Encounter (Signed)
Message left

## 2021-06-30 ENCOUNTER — Encounter: Payer: Medicare Other | Admitting: Gastroenterology

## 2021-07-04 ENCOUNTER — Encounter: Payer: Self-pay | Admitting: Gastroenterology

## 2021-07-27 ENCOUNTER — Encounter: Payer: Self-pay | Admitting: Adult Health

## 2021-07-27 ENCOUNTER — Ambulatory Visit (INDEPENDENT_AMBULATORY_CARE_PROVIDER_SITE_OTHER): Payer: Medicare Other | Admitting: Adult Health

## 2021-07-27 VITALS — BP 156/82 | HR 62 | Ht <= 58 in | Wt 161.1 lb

## 2021-07-27 DIAGNOSIS — I619 Nontraumatic intracerebral hemorrhage, unspecified: Secondary | ICD-10-CM | POA: Diagnosis not present

## 2021-07-27 DIAGNOSIS — I10 Essential (primary) hypertension: Secondary | ICD-10-CM

## 2021-07-27 DIAGNOSIS — E785 Hyperlipidemia, unspecified: Secondary | ICD-10-CM | POA: Diagnosis not present

## 2021-07-27 DIAGNOSIS — E1142 Type 2 diabetes mellitus with diabetic polyneuropathy: Secondary | ICD-10-CM

## 2021-07-27 MED ORDER — GABAPENTIN 300 MG PO CAPS: 300.0000 mg | ORAL_CAPSULE | Freq: Three times a day (TID) | ORAL | 11 refills | Status: DC

## 2021-07-27 NOTE — Patient Instructions (Signed)
Continue aspirin 81 mg daily  and atorvastatin 80mg  daily  for secondary stroke prevention  Continue to follow up with PCP regarding cholesterol, blood pressure and diabetes management  Maintain strict control of hypertension with blood pressure goal below 130/90, diabetes with hemoglobin A1c goal below 7.0% and cholesterol with LDL cholesterol (bad cholesterol) goal below 70 mg/dL.   Start gabapentin 300mg  three times daily (slowly increase dosage - if difficulty tolerating day time dose, you can take 2 capsules at night). If not benefit after 2 weeks but tolerating okay, please let me know so we can increase dose     Followup in the future with me in 6 months or call earlier if needed       Thank you for coming to see [place] at [facility]. I hope we have been able to provide you high quality care today.  You may receive a patient satisfaction survey over the next few weeks. We would appreciate your feedback and comments so that we may continue to improve ourselves and the health of our patients.

## 2021-07-27 NOTE — Progress Notes (Signed)
[facility] [address] [place]. [place] [address] [phone]       STROKE FOLLOW UP NOTE  [name] Date of Birth:  September 24, 1951 Medical Record Number:  [id number]   Reason for Referral: stroke follow up    SUBJECTIVE:   CHIEF COMPLAINT:  Chief Complaint  Patient presents with   Follow-up    Rm 2 with daughter here for 6 month f/u- pt reports left sided numbness/tingling still on the left side has been noted in the right foot as well. 3 falls since last visit- no injuries.     HPI:   Update 07/27/2021 JM: returns for 6 month stroke follow up accompanied by her daughter.  Overall stable without new stroke/TIA symptoms.  Reports continued left sided numbness/tingling. Left sided pain has been interfering with sleep, walking and any type of activity. At prior visit, declined interest in any medication management.  Also right foot numbness which she believes is from prior foot surgery.  She is currently using rolling walker due to worsening right foot pain.  She has had 3 falls since prior visit but thankfully without injury.  Compliant on aspirin and atorvastatin without side effects.  Blood pressure today 156/82.  Routinely monitors at home and typically stable.  Glucose levels stable per report.  Plans on obtaining lab work with PCP in the next week.  No further concerns at this time.     History provided for reference purposes only  Update 01/25/2021 JM: [name] returns for 66-month stroke follow-up accompanied by her daughter and infant great granddaughter  Stable since prior visit without new stroke/TIA symptoms.  Residual left sided numbness/tingling (thigh to foot and hand). Possibly improving but denies worsening.  Was seen by Dr. Letta Pate 2/15 and placed on low-dose gabapentin but she self discontinued as she did not feel as though this was beneficial.  She declines interest in any further medication management She also continues to have gait  instability but also recently underwent right foot hardware removal on 6/64/4034 without complication -she has been slowly recovering from this.  Reports compliance on aspirin 81 mg daily and atorvastatin without associated side effects Blood pressure today 151/91 - monitors at home and typically lower Glucose levels stable at home per patient report She has not had any recent lab  No new concerns at this time  Initial visit 10/26/2020 JM: [name] is being seen for hospital follow-up unaccompanied. She was discharged home from CIR on 09/15/2020 with recommendation of [facility]. She reports residual left foot numbness and gait impairment. She also reports right foot fracture s/p surgical procedure 08/2020 with planned hardware removal tomorrow but due to recent stroke, procedure delayed.  She believes this is also be contributing to her gait impairment.  Denies new or worsening stroke/TIA symptoms. She remains on atorvastatin 20 mg daily without myalgias. Blood pressure today 158/78 - monitors at home which typically stable . Glucose levels stable at home. Reports repeat A1c with PCP which was at 7.9.  No concerns at this time.  Stroke admission 09/02/2020 [name] is a 70 y.o. female with history of chronic headaches chronic migraine, chronic numbness BLE, chronic pain syndrome, CKD, hypertension, hyperlipidemia, type 2 diabetes, and seizures since the age of 72, who presented to [facility] ED on 09/02/2020 with left sided weakness and mild headache. Personally reviewed hospitalization pertinent progress notes, lab work and imaging with summary provided. Evaluated by Dr. Leonie Man with stroke work-up revealing acute right corona  radiata IPH with mild surrounding edema and trace IVH, etiology likely hypertensive. History of HTN with long-term BP goal normotensive range. History of HLD on atorvastatin 40 mg daily with LDL 69 therefore decrease dosage to 20 mg daily in setting of recent IPH. History of DM with A1c  8.1 on insulin and glipizide. Other stroke risk factors include advanced age, obesity, migraines and prior strokes by imaging. Other active problems include CKD and seizure disorder on phenobarbital. Evaluated by therapies and discharged to CIR on 09/06/2020 for ongoing therapy needs and residual left-sided weakness and balance impairment.   Stroke: acute R corona radiata IPH with mild surrounding edema and trace IVH-etiology likely hypertensive CT head -  2 cc acute hematoma in the right corona radiata. There is early extension into the right lateral ventricle. Background chronic small vessel disease.  CT head repeat - Unchanged appearance of right basal ganglia intraparenchymal hemorrhage.  MRI head - Acute R corona radiata IPH with mild surrounding edema and trace IVH. No evidence of underlying lesion. Few chronic microhemorrhages. Mild to moderate chronic microvascular ischemic changes. Chronic small vessel infarcts CTA H&N -no large vessel stenosis or occlusion no aneurysm or AVM. 2D Echo - EF 60-65%. No source of embolus   Natalie [name] Virus 2 - negative LDL - 69 HgbA1c - 8.1 UDS - (not collected)  VTE prophylaxis - SCDs No antithrombotic prior to admission, now on No antithrombotic ICH Therapy recommendations:  CIR  Disposition:  CIR      ROS:   14 system review of systems performed and negative with exception of those listed in HPI  PMH:  Past Medical History:  Diagnosis Date   Arthritis    Atrophy of left kidney    Cervicalgia    Chronic kidney disease, stage 3 ([place])    acute aki 03-14-2018 in setting pyelonehritis-- resolved   Chronic migraine    Chronic pain syndrome    History of kidney stones    History of pyelonephritis    hx recurrent   History of recurrent UTIs    History of sepsis    07/ 2017 secondary to pyelonephritis   Hypertension    Hypothyroidism    Iron deficiency anemia    Left ureteral stone    Mixed hyperlipidemia    Nephrolithiasis    right  side nonobstructive per CT 03-14-2018   Right shoulder pain    post sx 02-18-2018   Seizure disorder ([place]) followed by pcp   per pt dx age 65--  no seizure since 1980's , controlled w/ phenobarbitol   Stroke ([place]) 08/2020   no deficits   Type 2 diabetes mellitus ([place])    followed by pcp   Wears glasses     PSH:  Past Surgical History:  Procedure Laterality Date   CESAREAN SECTION  1974   CHOLECYSTECTOMY OPEN  2002   CYSTO/  LEFT RETROGRADE PYELOGRAM  06-11-2006   dr Jeffie Pollock  [place], URETEROSCOPY AND STENT PLACEMENT Left 04/10/2018   Procedure: CYSTOSCOPY WITH RETROGRADE PYELOGRAM, URETEROSCOPY AND STENT PLACEMENT;  Surgeon: Franchot Gallo, MD;  Location: [facility];  Service: Urology;  Laterality: Left;   FOOT ARTHRODESIS Right 09/10/2019   Procedure: ARTHRODESIS FOOT;  Surgeon: Erle Crocker, MD;  Location: [facility];  Service: Orthopedics;  Laterality: Right;   HARDWARE REMOVAL Right 12/27/2020   Procedure: DEEP ORTHOPEDIC HARDWARE REMOVAL, RIGHT FOOT;  Surgeon: Erle Crocker, MD;  Location: [facility];  Service: Orthopedics;  Laterality: Right;   IR URETERAL STENT PLACEMENT EXISTING ACCESS RIGHT  08-21-2007    dr Karsten Ro  [facility]   LEFT RETROGRADE PYELOGRAM/ LEFT URETERAL STENT PLACEMENT  02-25-2001   dr Jeffie Pollock [facility]   NEPHROLITHOTOMY Right 05/14/2016   Procedure: NEPHROLITHOTOMY PERCUTANEOUS;  Surgeon: Franchot Gallo, MD;  Location: [facility];  Service: Urology;  Laterality: Right;   OPEN REDUCTION INTERNAL FIXATION (ORIF) FOOT LISFRANC FRACTURE Right 09/10/2019   Procedure: OPEN REDUCTION INTERNAL FIXATION (ORIF) FOOT LISFRANC FRACTURE;  Surgeon: Erle Crocker, MD;  Location: [facility];  Service: Orthopedics;  Laterality: Right;   REPAIR EXTENSOR TENDON WITH METATARSAL OSTEOTOMY AND OPEN REDUCTION IN Right 09/10/2019   Procedure: OPEN TREATMENT RIGHT FIRST, SECOND,  THIRD TARSOMETATARSAL LISFRANC, MIDFOOT FUSION;  Surgeon: Erle Crocker, MD;  Location: [facility];  Service: Orthopedics;  Laterality: Right;   SHOULDER ARTHROSCOPY Bilateral left 11-13-2000;  right 02-18-2018    Social History:  Social History   Socioeconomic History   Marital status: Married    Spouse name: Not on file   Number of children: 2   Years of education: 2 years of college   Highest education level: Not on file  Occupational History   Not on file  Tobacco Use   Smoking status: Never   Smokeless tobacco: Never  Vaping Use   Vaping Use: Never used  Substance and Sexual Activity   Alcohol use: No    Alcohol/week: 0.0 standard drinks   Drug use: No   Sexual activity: Not on file  Other Topics Concern   Not on file  Social History Narrative   ** Merged History Encounter **       Social Determinants of Health   Financial Resource Strain: Not on file  Food Insecurity: Not on file  Transportation Needs: Not on file  Physical Activity: Not on file  Stress: Not on file  Social Connections: Not on file  Intimate Partner Violence: Not on file    Family History:  Family History  Problem Relation Age of Onset   Heart disease Father    Diabetes Father     Medications:   Current Outpatient Medications on File Prior to Visit  Medication Sig Dispense Refill   amLODipine (NORVASC) 5 MG tablet Take 1 tablet (5 mg total) by mouth daily. (Patient taking differently: Take 5 mg by mouth daily with supper.) 90 tablet 3   aspirin EC 81 MG tablet Take 1 tablet (81 mg total) by mouth daily. Swallow whole. 30 tablet 11   atorvastatin (LIPITOR) 80 MG tablet SMARTSIG:1 Tablet(s) By Mouth Every Evening     BD INSULIN SYRINGE U/F 31G X 5/16" 0.3 ML MISC Use 1 syringe as directed twice a day     butorphanol (STADOL) 10 MG/ML nasal spray PLACE 1 SPRAY INTO THE NOSE EVERY 4 HOURS AS NEEDED FOR MIGRAINE. (Patient taking differently: Place 1 spray into the nose  every 4 (four) hours as needed for headache or migraine. PLACE 1 SPRAY INTO THE NOSE EVERY 4 HOURS AS NEEDED FOR MIGRAINE.) 2.5 mL 5   glipiZIDE (GLUCOTROL) 10 MG tablet Take 1 tablet (10 mg total) by mouth daily before breakfast. (Patient taking differently: Take 20 mg by mouth 2 (two) times daily before a meal.)     insulin NPH Human (NOVOLIN N) 100 UNIT/ML injection Inject 20 Units into the skin in the morning and at bedtime.     levothyroxine (SYNTHROID, LEVOTHROID) 75 MCG tablet  Take 1 tablet (75 mcg total) by mouth daily. (Patient taking differently: Take 50 mcg by mouth daily.) 90 tablet 1   PHENobarbital (LUMINAL) 97.2 MG tablet TAKE 2 TABLETS BY MOUTH ONCE DAILY ON MON,WED,AND FRI AND 1 TABLET DAILY ON OTHER DAYS (Patient taking differently: Take 97.2-194.4 mg by mouth See admin instructions. TAKE 2 TABLETS (194.4 MG TOTALLY) BY MOUTH ON MON, FRI, SUN; TAKE 1 TABLET (97.2 MG TOTALLY) DAILY ON OTHER DAYS) 135 tablet 1   tiZANidine (ZANAFLEX) 4 MG tablet TAKE 1 TABLET BY MOUTH EVERY 4-6 HOURS AS NEEDED FOR MUSCLE SPASM (Patient taking differently: Take 4 mg by mouth every 4 (four) hours as needed for muscle spasms.) 180 tablet 1   zolpidem (AMBIEN) 10 MG tablet take 1 tablet by mouth at bedtime for sleep if needed. (Patient taking differently: Take 10 mg by mouth at bedtime as needed for sleep. take 1 tablet by mouth at bedtime for sleep if needed.) 90 tablet 1   No current facility-administered medications on file prior to visit.    Allergies:   Allergies  Allergen Reactions   Compazine Other (See Comments)    Bad headache, vomiting; IV    Cymbalta [Duloxetine Hcl] Other (See Comments)    "Kept me awake"   Methadone Hives   Escitalopram Anxiety   Macrobid [Nitrofurantoin Monohyd Macro] Rash   Nitrofuran Derivatives Rash      OBJECTIVE:  Physical Exam  Vitals:   07/27/21 1449  BP: (!) 156/82  Pulse: 62  SpO2: 99%  Weight: 161 lb 2 oz (73.1 kg)  Height: 4\' 8"  (1.422 m)     Body mass index is 36.12 kg/m. No results found.  General: well developed, well nourished, very pleasant elderly Caucasian female, seated, in no evident distress Head: head normocephalic and atraumatic.   Neck: supple with no carotid or supraclavicular bruits Cardiovascular: regular rate and rhythm, no murmurs Musculoskeletal: no deformity Skin:  no rash/petichiae Vascular:  Normal pulses all extremities   Neurologic Exam Mental Status: Awake and fully alert. Fluent speech and language. Oriented to place and time. Recent and remote memory intact. Attention span, concentration and fund of knowledge appropriate. Mood and affect appropriate.  Cranial Nerves: Pupils equal, briskly reactive to light. Extraocular movements full without nystagmus. Visual fields full to confrontation. Hearing intact. Facial sensation intact. Face, tongue, palate moves normally and symmetrically.  Motor: Normal bulk and tone. Normal strength in all tested extremity muscles Sensory.: reports altered light touch sensation LLE compared to RLE Coordination: Rapid alternating movements normal in all extremities. Finger-to-nose and heel-to-shin performed accurately bilaterally. Gait and Station: Arises from chair without difficulty. Stance is normal. Gait demonstrates normal stride length with mild unsteadiness/imbalance with RW.  Tandem walk and heel toe not attempted Reflexes: 1+ and symmetric. Toes downgoing.       ASSESSMENT: Natalie [name] is a 70 y.o. year old female presented with left-sided weakness and mild headache on 09/02/2020 with stroke work-up revealing acute right corona radiata IPH with mild surrounding edema and trace IVH, likely etiology hypertensive. Vascular risk factors include HTN, HLD, DM, obesity, migraines, seizure disorder and prior strokes on imaging.     PLAN:  R CR IPH :  Residual deficit: Left-sided dysesthesias and gait unsteadiness. Start gabapentin 300 mg 3 times daily.  Advised  to call if no benefit after 2 weeks.  Discussed potential side effects and advised to call with any concerns. Continue aspirin 81 mg daily and atorvastatin 20 mg daily for secondary stroke  prevention.   Discussed secondary stroke prevention measures and importance of close PCP follow up for aggressive stroke risk factor management  HTN: BP goal <130/90.  Stable on current regiment monitored by PCP HLD: LDL goal <70. On atorvastatin 80mg  daily per PCP - plans on repeat lipid panel with PCP in the next week DMII: A1c goal<7.0.  On glipizide and insulin managed by PCP    Follow up in 6 months or call earlier if needed   CC:  Buzzy Han, MD    I spent 32 minutes of face-to-face and non-face-to-face time with patient and daughter.  This included previsit chart review, lab review, study review, order entry, electronic health record documentation, patient education and discussion regarding prior stroke with residual deficits, use of gabapentin and potential side effects, aggressive stroke risk factor management and secondary stroke prevention measures and answered all other questions to patient and daughter satisfaction   Frann Rider, AGNP-BC  [facility] [address] [place], [place] [address]  Phone [phone] Fax [phone] Note: This document was prepared with digital dictation and possible smart phrase technology. Any transcriptional errors that result from this process are unintentional.

## 2021-08-22 ENCOUNTER — Telehealth: Payer: Self-pay | Admitting: Adult Health

## 2021-08-22 NOTE — Telephone Encounter (Signed)
Pt called stating that Dr. Brigitte Pulse has left the practice and they are no longer going to be refilling her PHENobarbital (LUMINAL) 97.2 MG tablet and informed her that her Neurologist will have to fill it from now on. Pt would like to speak to the RN.

## 2021-08-23 NOTE — Telephone Encounter (Signed)
LVM per DPR informing pt of Jessica's message. Will also send Via MyChart, advised to call office back with questions.

## 2021-08-23 NOTE — Telephone Encounter (Signed)
Patient has been on phenytoin for many years for seizure prevention which was managed by PCP. We have seen her for stroke follow up but did not discuss hx of seizures or use of phenytoin. As she has been stable on this medication, there is no reason PCP cannot continue to manage. IF PCP wishes for [place] to take over this care, they will have to place a new referral for new issue. Thank you.

## 2021-09-15 ENCOUNTER — Other Ambulatory Visit: Payer: Self-pay

## 2021-09-15 ENCOUNTER — Ambulatory Visit
Admission: RE | Admit: 2021-09-15 | Discharge: 2021-09-15 | Disposition: A | Discharge status: Home / Self Care | Payer: Medicare Other | Source: Ambulatory Visit | Attending: Orthopaedic Surgery | Admitting: Orthopaedic Surgery

## 2021-09-15 ENCOUNTER — Other Ambulatory Visit: Payer: Self-pay | Admitting: Orthopaedic Surgery

## 2021-09-15 DIAGNOSIS — S72491A Other fracture of lower end of right femur, initial encounter for closed fracture: Secondary | ICD-10-CM

## 2021-09-19 ENCOUNTER — Other Ambulatory Visit: Payer: Self-pay | Admitting: Orthopaedic Surgery

## 2021-09-22 ENCOUNTER — Encounter (HOSPITAL_COMMUNITY): Payer: Self-pay | Admitting: Orthopaedic Surgery

## 2021-09-22 ENCOUNTER — Other Ambulatory Visit: Payer: Self-pay

## 2021-09-22 NOTE — Progress Notes (Signed)
PCP - Buzzy Han Cardiologist - Denies EKG - 09/06/20 Will need another on day of surgery 09/28/21 ECHO - 09/04/20 Cardiac Cath Denies CPAP - Denies DM - Type  II Blood Sugar:  140 averages Checks Blood Sugar:  daily  Aspirin Instructions: Patient states she is not taking since a week ago, recommending to call Dr. Lucia Gaskins to find out instruction on ASA ERAS Protcol - Yes will recommend G2 COVID TEST- Not indicated   Anesthesia review: No  -------------  SDW INSTRUCTIONS:  Your procedure is scheduled on Thursday December 29th. Please report to [facility]tr[facility] "A" at 12:45 P.M., and check in at the Admitting office. Call this number if you have problems the morning of surgery: [phone]   Remember: Do not eat after midnight the night before your surgery  You may drink clear liquids until 1215 the afternoon of your surgery.   Clear liquids allowed are: Water, Non-Citrus Juices (without pulp), Carbonated Beverages, Clear Tea, Black Coffee Only, and Gatorade   Medications to take morning of surgery with a sip of water include: amLODipine (NORVASC) 10 MG tablet takes at night atorvastatin (LIPITOR) 80 MG tablet takes at night butorphanol (STADOL) 10 MG/ML nasal spray if needed gabapentin (NEURONTIN) 300 MG capsule takes at night levothyroxine (SYNTHROID) 50 MCG tablet takes at night PHENobarbital (LUMINAL) 97.2 MG tablet takes at night tiZANidine (ZANAFLEX) 4 MG tablet if needed   Follow your surgeon's instructions on when to stop Aspirin.  If no instructions were given by your surgeon then you will need to call the office to get those instructions.     For your diabetes: DO NOT TAKE your Glipizide on day of surgery. On day before surgery do not take any evening dose of Glipizide, morning dose day before surgery is ok.  NPH insulin : May take 50% of dose the night before surgery (10 units) and may take 50% of dose the day of surgery (10 units) Check your blood  sugar every two hours on day of surgery.  If less than 70 take in 1/2 cup of juice (no pulp), glucose tabs, or glucose gel.  Recheck in 15 minutes to be sure it came up above 70.  If not call [phone] for instruction.   As of today, STOP taking any Aspirin (unless otherwise instructed by your surgeon), Aleve, Naproxen, Ibuprofen, Motrin, Advil, Goody's, BC's, all herbal medications, fish oil, and all vitamins.    The Morning of Surgery Do not wear jewelry, make-up or nail polish. Do not wear lotions, powders, or perfumes, or deodorant Do not bring valuables to the hospital. [facility] is not responsible for any belongings or valuables.  If you are a smoker, DO NOT Smoke 24 hours prior to surgery  If you wear a CPAP at night please bring your mask the morning of surgery   Remember that you must have someone to transport you home after your surgery, and remain with you for 24 hours if you are discharged the same day.  Please bring cases for contacts, glasses, hearing aids, dentures or bridgework because it cannot be worn into surgery.   Patients discharged the day of surgery will not be allowed to drive home.   Please shower the NIGHT BEFORE/MORNING OF SURGERY (use antibacterial soap like DIAL soap if possible). Wear comfortable clothes the morning of surgery. Oral Hygiene is also important to reduce your risk of infection.  Remember - BRUSH YOUR TEETH THE MORNING OF SURGERY WITH YOUR REGULAR TOOTHPASTE  Patient denies shortness of breath, fever, cough and chest pain.

## 2021-09-27 NOTE — Anesthesia Preprocedure Evaluation (Addendum)
Anesthesia Evaluation  Patient identified by MRN, date of birth, ID band Patient awake    Reviewed: Allergy & Precautions, NPO status , Patient's Chart, lab work & pertinent test results  Airway Mallampati: II  TM Distance: >3 FB Neck ROM: Full    Dental no notable dental hx. (+) Teeth Intact, Dental Advisory Given   Pulmonary neg pulmonary ROS,    Pulmonary exam normal breath sounds clear to auscultation       Cardiovascular hypertension, Pt. on medications Normal cardiovascular exam Rhythm:Regular Rate:Normal     Neuro/Psych  Headaches, CVA (L weakness), Residual Symptoms    GI/Hepatic negative GI ROS, Neg liver ROS,   Endo/Other  diabetes, Type 1, Insulin DependentHypothyroidism   Renal/GU Renal InsufficiencyRenal disease     Musculoskeletal  (+) Arthritis ,   Abdominal   Peds  Hematology     Anesthesia Other Findings   Reproductive/Obstetrics                            Anesthesia Physical Anesthesia Plan  ASA: 3  Anesthesia Plan: Regional and MAC   Post-op Pain Management: Regional block and Minimal or no pain anticipated   Induction:   PONV Risk Score and Plan: 3 and Treatment may vary due to age or medical condition, Midazolam and Ondansetron  Airway Management Planned: Simple Face Mask and Natural Airway  Additional Equipment: None  Intra-op Plan:   Post-operative Plan:   Informed Consent: I have reviewed the patients History and Physical, chart, labs and discussed the procedure including the risks, benefits and alternatives for the proposed anesthesia with the patient or authorized representative who has indicated his/her understanding and acceptance.     Dental advisory given  Plan Discussed with: CRNA and Anesthesiologist  Anesthesia Plan Comments: (L POPw L adductor canal)       Anesthesia Quick Evaluation

## 2021-09-28 ENCOUNTER — Ambulatory Visit (HOSPITAL_COMMUNITY): Payer: Medicare Other | Admitting: Anesthesiology

## 2021-09-28 ENCOUNTER — Ambulatory Visit (HOSPITAL_COMMUNITY)
Admission: RE | Admit: 2021-09-28 | Discharge: 2021-09-28 | Disposition: A | Discharge status: Home / Self Care | Payer: Medicare Other | Attending: Orthopaedic Surgery | Admitting: Orthopaedic Surgery

## 2021-09-28 ENCOUNTER — Ambulatory Visit (HOSPITAL_COMMUNITY): Payer: Medicare Other

## 2021-09-28 ENCOUNTER — Encounter (HOSPITAL_COMMUNITY)
Admission: RE | Disposition: A | Discharge status: Home / Self Care | Payer: Self-pay | Source: Home / Self Care | Attending: Orthopaedic Surgery

## 2021-09-28 ENCOUNTER — Other Ambulatory Visit: Payer: Self-pay

## 2021-09-28 ENCOUNTER — Encounter (HOSPITAL_COMMUNITY): Payer: Self-pay | Admitting: Orthopaedic Surgery

## 2021-09-28 DIAGNOSIS — S82855A Nondisplaced trimalleolar fracture of left lower leg, initial encounter for closed fracture: Secondary | ICD-10-CM | POA: Insufficient documentation

## 2021-09-28 DIAGNOSIS — S72412A Displaced unspecified condyle fracture of lower end of left femur, initial encounter for closed fracture: Secondary | ICD-10-CM | POA: Insufficient documentation

## 2021-09-28 DIAGNOSIS — W19XXXA Unspecified fall, initial encounter: Secondary | ICD-10-CM | POA: Diagnosis not present

## 2021-09-28 DIAGNOSIS — E119 Type 2 diabetes mellitus without complications: Secondary | ICD-10-CM | POA: Insufficient documentation

## 2021-09-28 DIAGNOSIS — Z20822 Contact with and (suspected) exposure to covid-19: Secondary | ICD-10-CM | POA: Diagnosis not present

## 2021-09-28 DIAGNOSIS — Z419 Encounter for procedure for purposes other than remedying health state, unspecified: Secondary | ICD-10-CM

## 2021-09-28 HISTORY — PX: ORIF ANKLE FRACTURE: SHX5408

## 2021-09-28 LAB — BASIC METABOLIC PANEL
Anion gap: 11 (ref 5–15)
BUN: 19 mg/dL (ref 8–23)
CO2: 25 mmol/L (ref 22–32)
Calcium: 9.7 mg/dL (ref 8.9–10.3)
Chloride: 102 mmol/L (ref 98–111)
Creatinine, Ser: 1.13 mg/dL — ABNORMAL HIGH (ref 0.44–1.00)
GFR, Estimated: 52 mL/min — ABNORMAL LOW (ref >60)
Glucose, Bld: 219 mg/dL — ABNORMAL HIGH (ref 70–99)
Potassium: 3.7 mmol/L (ref 3.5–5.1)
Sodium: 138 mmol/L (ref 135–145)

## 2021-09-28 LAB — CBC
HCT: 35.7 % — ABNORMAL LOW (ref 36.0–46.0)
Hemoglobin: 11 g/dL — ABNORMAL LOW (ref 12.0–15.0)
MCH: 27.8 pg (ref 26.0–34.0)
MCHC: 30.8 g/dL (ref 30.0–36.0)
MCV: 90.2 fL (ref 80.0–100.0)
Platelets: 229 10*3/uL (ref 150–400)
RBC: 3.96 MIL/uL (ref 3.87–5.11)
RDW: 14.6 % (ref 11.5–15.5)
WBC: 9.2 10*3/uL (ref 4.0–10.5)
nRBC: 0 % (ref 0.0–0.2)

## 2021-09-28 LAB — SARS CORONAVIRUS 2 BY RT PCR (HOSPITAL ORDER, PERFORMED IN ~~LOC~~ HOSPITAL LAB): SARS Coronavirus 2: NEGATIVE

## 2021-09-28 LAB — GLUCOSE, CAPILLARY: Glucose-Capillary: 201 mg/dL — ABNORMAL HIGH (ref 70–99)

## 2021-09-28 LAB — HEMOGLOBIN A1C
Hgb A1c MFr Bld: 10.3 % — ABNORMAL HIGH (ref 4.8–5.6)
Mean Plasma Glucose: 248.91 mg/dL

## 2021-09-28 LAB — SURGICAL PCR SCREEN
MRSA, PCR: NEGATIVE
Staphylococcus aureus: POSITIVE — AB

## 2021-09-28 SURGERY — OPEN REDUCTION INTERNAL FIXATION (ORIF) ANKLE FRACTURE
Anesthesia: Monitor Anesthesia Care | Site: Ankle | Laterality: Left

## 2021-09-28 MED ORDER — ORAL CARE MOUTH RINSE: 15.0000 mL | Freq: Once | OROMUCOSAL | Status: AC

## 2021-09-28 MED ORDER — PROPOFOL 10 MG/ML IV BOLUS
INTRAVENOUS | Status: AC
  Filled 2021-09-28: qty 20

## 2021-09-28 MED ORDER — ONDANSETRON HCL 4 MG/2ML IJ SOLN
INTRAMUSCULAR | Status: DC | PRN
  Administered 2021-09-28: 4 mg via INTRAVENOUS

## 2021-09-28 MED ORDER — 0.9 % SODIUM CHLORIDE (POUR BTL) OPTIME
TOPICAL | Status: DC | PRN
  Administered 2021-09-28: 16:00:00 1000 mL

## 2021-09-28 MED ORDER — OXYCODONE HCL 5 MG PO TABS: 5.0000 mg | ORAL_TABLET | ORAL | 0 refills | Status: AC | PRN

## 2021-09-28 MED ORDER — CHLORHEXIDINE GLUCONATE 0.12 % MT SOLN
OROMUCOSAL | Status: AC
  Administered 2021-09-28: 15:00:00 15 mL via OROMUCOSAL
  Filled 2021-09-28: qty 15

## 2021-09-28 MED ORDER — ASPIRIN EC 81 MG PO TBEC: 325.0000 mg | DELAYED_RELEASE_TABLET | Freq: Every day | ORAL | 0 refills | Status: AC

## 2021-09-28 MED ORDER — FENTANYL CITRATE (PF) 100 MCG/2ML IJ SOLN: 25.0000 ug | INTRAMUSCULAR | Status: DC | PRN

## 2021-09-28 MED ORDER — FENTANYL CITRATE (PF) 250 MCG/5ML IJ SOLN
INTRAMUSCULAR | Status: AC
  Filled 2021-09-28: qty 5

## 2021-09-28 MED ORDER — PROPOFOL 10 MG/ML IV BOLUS
INTRAVENOUS | Status: DC | PRN
  Administered 2021-09-28: 140 mg via INTRAVENOUS

## 2021-09-28 MED ORDER — ROPIVACAINE HCL 5 MG/ML IJ SOLN
INTRAMUSCULAR | Status: DC | PRN
  Administered 2021-09-28: 25 mL via PERINEURAL
  Administered 2021-09-28: 15 mL via PERINEURAL

## 2021-09-28 MED ORDER — FENTANYL CITRATE (PF) 100 MCG/2ML IJ SOLN
INTRAMUSCULAR | Status: AC
  Administered 2021-09-28: 15:00:00 50 ug via INTRAVENOUS
  Filled 2021-09-28: qty 2

## 2021-09-28 MED ORDER — PHENYLEPHRINE HCL-NACL 20-0.9 MG/250ML-% IV SOLN
INTRAVENOUS | Status: DC | PRN
  Administered 2021-09-28: 25 ug/min via INTRAVENOUS

## 2021-09-28 MED ORDER — ACETAMINOPHEN 10 MG/ML IV SOLN: 1000.0000 mg | Freq: Once | INTRAVENOUS | Status: DC | PRN

## 2021-09-28 MED ORDER — CHLORHEXIDINE GLUCONATE 0.12 % MT SOLN: 15.0000 mL | Freq: Once | OROMUCOSAL | Status: AC

## 2021-09-28 MED ORDER — VANCOMYCIN HCL 1000 MG IV SOLR: INTRAVENOUS | Status: DC | PRN

## 2021-09-28 MED ORDER — LIDOCAINE 2% (20 MG/ML) 5 ML SYRINGE
INTRAMUSCULAR | Status: AC
  Filled 2021-09-28: qty 5

## 2021-09-28 MED ORDER — CLONIDINE HCL (ANALGESIA) 100 MCG/ML EP SOLN
EPIDURAL | Status: DC | PRN
  Administered 2021-09-28 (×2): 100 ug

## 2021-09-28 MED ORDER — FENTANYL CITRATE (PF) 250 MCG/5ML IJ SOLN
INTRAMUSCULAR | Status: DC | PRN
  Administered 2021-09-28: 50 ug via INTRAVENOUS

## 2021-09-28 MED ORDER — CEFAZOLIN SODIUM-DEXTROSE 2-4 GM/100ML-% IV SOLN
2.0000 g | INTRAVENOUS | Status: AC
  Administered 2021-09-28: 16:00:00 2 g via INTRAVENOUS
  Filled 2021-09-28: qty 100

## 2021-09-28 MED ORDER — ONDANSETRON HCL 4 MG/2ML IJ SOLN: 4.0000 mg | Freq: Once | INTRAMUSCULAR | Status: DC | PRN

## 2021-09-28 MED ORDER — FENTANYL CITRATE (PF) 100 MCG/2ML IJ SOLN: 50.0000 ug | Freq: Once | INTRAMUSCULAR | Status: AC

## 2021-09-28 MED ORDER — LACTATED RINGERS IV SOLN: INTRAVENOUS | Status: DC

## 2021-09-28 MED ORDER — MIDAZOLAM HCL 2 MG/2ML IJ SOLN
INTRAMUSCULAR | Status: AC
  Filled 2021-09-28: qty 2

## 2021-09-28 MED ORDER — ONDANSETRON HCL 4 MG/2ML IJ SOLN
INTRAMUSCULAR | Status: AC
  Filled 2021-09-28: qty 2

## 2021-09-28 MED ORDER — VANCOMYCIN HCL 1000 MG IV SOLR
INTRAVENOUS | Status: AC
  Filled 2021-09-28: qty 20

## 2021-09-28 MED ORDER — LIDOCAINE 2% (20 MG/ML) 5 ML SYRINGE
INTRAMUSCULAR | Status: DC | PRN
  Administered 2021-09-28: 60 mg via INTRAVENOUS

## 2021-09-28 SURGICAL SUPPLY — 68 items
ALCOHOL 70% 16 OZ (MISCELLANEOUS) ×3 IMPLANT
APL PRP STRL LF DISP 70% ISPRP (MISCELLANEOUS) ×2
BAG COUNTER SPONGE SURGICOUNT (BAG) ×2 IMPLANT
BAG SPNG CNTER NS LX DISP (BAG) ×1
BAG SURGICOUNT SPONGE COUNTING (BAG) ×1
BANDAGE ESMARK 6X9 LF (GAUZE/BANDAGES/DRESSINGS) IMPLANT
BIT DRILL 2 CANN GRADUATED (BIT) ×2 IMPLANT
BIT DRILL 2.5 CANN STRL (BIT) ×2 IMPLANT
BLADE SURG 15 STRL LF DISP TIS (BLADE) ×1 IMPLANT
BLADE SURG 15 STRL SS (BLADE) ×3
BNDG CMPR 9X6 STRL LF SNTH (GAUZE/BANDAGES/DRESSINGS)
BNDG CMPR MED 10X6 ELC LF (GAUZE/BANDAGES/DRESSINGS) ×1
BNDG COHESIVE 4X5 TAN STRL (GAUZE/BANDAGES/DRESSINGS) IMPLANT
BNDG COHESIVE 6X5 TAN STRL LF (GAUZE/BANDAGES/DRESSINGS) IMPLANT
BNDG ELASTIC 4X5.8 VLCR STR LF (GAUZE/BANDAGES/DRESSINGS) ×2 IMPLANT
BNDG ELASTIC 6X10 VLCR STRL LF (GAUZE/BANDAGES/DRESSINGS) ×3 IMPLANT
BNDG ELASTIC 6X5.8 VLCR STR LF (GAUZE/BANDAGES/DRESSINGS) ×2 IMPLANT
BNDG ESMARK 6X9 LF (GAUZE/BANDAGES/DRESSINGS)
CANISTER SUCT 3000ML PPV (MISCELLANEOUS) ×3 IMPLANT
CHLORAPREP W/TINT 26 (MISCELLANEOUS) ×6 IMPLANT
COVER SURGICAL LIGHT HANDLE (MISCELLANEOUS) ×3 IMPLANT
CUFF TOURN SGL QUICK 34 (TOURNIQUET CUFF) ×3
CUFF TOURN SGL QUICK 42 (TOURNIQUET CUFF) IMPLANT
CUFF TRNQT CYL 34X4.125X (TOURNIQUET CUFF) ×1 IMPLANT
DRAPE OEC MINIVIEW 54X84 (DRAPES) ×3 IMPLANT
DRAPE U-SHAPE 47X51 STRL (DRAPES) ×3 IMPLANT
DRSG MEPITEL 4X7.2 (GAUZE/BANDAGES/DRESSINGS) ×3 IMPLANT
DRSG PAD ABDOMINAL 8X10 ST (GAUZE/BANDAGES/DRESSINGS) ×6 IMPLANT
DRSG XEROFORM 1X8 (GAUZE/BANDAGES/DRESSINGS) ×3 IMPLANT
ELECT REM PT RETURN 9FT ADLT (ELECTROSURGICAL) ×3
ELECTRODE REM PT RTRN 9FT ADLT (ELECTROSURGICAL) ×1 IMPLANT
GAUZE SPONGE 4X4 12PLY STRL (GAUZE/BANDAGES/DRESSINGS) ×2 IMPLANT
GAUZE SPONGE 4X4 12PLY STRL LF (GAUZE/BANDAGES/DRESSINGS) ×3 IMPLANT
GLOVE SRG 8 PF TXTR STRL LF DI (GLOVE) ×1 IMPLANT
GLOVE SURG ENC TEXT LTX SZ7.5 (GLOVE) ×3 IMPLANT
GLOVE SURG UNDER POLY LF SZ8 (GLOVE) ×3
GOWN STRL REUS W/ TWL LRG LVL3 (GOWN DISPOSABLE) ×1 IMPLANT
GOWN STRL REUS W/ TWL XL LVL3 (GOWN DISPOSABLE) ×1 IMPLANT
GOWN STRL REUS W/TWL LRG LVL3 (GOWN DISPOSABLE) ×3
GOWN STRL REUS W/TWL XL LVL3 (GOWN DISPOSABLE) ×3
GUIDEWIRE 1.35MM (WIRE) ×2 IMPLANT
KIT BASIN OR (CUSTOM PROCEDURE TRAY) ×3 IMPLANT
KIT TURNOVER KIT B (KITS) ×3 IMPLANT
NS IRRIG 1000ML POUR BTL (IV SOLUTION) ×3 IMPLANT
PACK ORTHO EXTREMITY (CUSTOM PROCEDURE TRAY) ×3 IMPLANT
PAD ARMBOARD 7.5X6 YLW CONV (MISCELLANEOUS) ×6 IMPLANT
PAD CAST 4YDX4 CTTN HI CHSV (CAST SUPPLIES) ×1 IMPLANT
PADDING CAST COTTON 4X4 STRL (CAST SUPPLIES) ×6
PLATE LOCK DIST FIB 5H TTNIUM (Plate) ×2 IMPLANT
PLATE LOCKING 1/3 TUB 5H (Plate) ×2 IMPLANT
SCREW CORT FT 3.5X22 (Screw) ×2 IMPLANT
SCREW LOCK COMP 3X16 (Screw) ×4 IMPLANT
SCREW LP TI 3.5X14MM (Screw) ×2 IMPLANT
SCREW LP TIT 3.5X30 (Screw) ×4 IMPLANT
SCREW LP TITANIUM 3.5X40 (Screw) ×2 IMPLANT
SCREW VAL KREULOCK 3.0X14 TI (Screw) ×6 IMPLANT
SPONGE T-LAP 18X18 ~~LOC~~+RFID (SPONGE) ×3 IMPLANT
SUCTION FRAZIER HANDLE 10FR (MISCELLANEOUS) ×3
SUCTION TUBE FRAZIER 10FR DISP (MISCELLANEOUS) ×1 IMPLANT
SUT ETHILON 3 0 PS 1 (SUTURE) ×7 IMPLANT
SUT MON AB 3-0 SH 27 (SUTURE)
SUT MON AB 3-0 SH27 (SUTURE) ×1 IMPLANT
SUT VIC AB 2-0 CT1 27 (SUTURE) ×3
SUT VIC AB 2-0 CT1 TAPERPNT 27 (SUTURE) ×2 IMPLANT
TOWEL GREEN STERILE (TOWEL DISPOSABLE) ×3 IMPLANT
TOWEL GREEN STERILE FF (TOWEL DISPOSABLE) ×3 IMPLANT
TUBE CONNECTING 12'X1/4 (SUCTIONS) ×1
TUBE CONNECTING 12X1/4 (SUCTIONS) ×2 IMPLANT

## 2021-09-28 NOTE — Anesthesia Postprocedure Evaluation (Signed)
Anesthesia Post Note  Patient: OHANA BIRDWELL  Procedure(s) Performed: OPEN TREATMENT OF LEFT TRIMALLEOLAR FRACTURE WITHOUT POSTERIOR FIXATION, OPEN TREATMENT OF LEFT SYNDESMOSIS DISRUPTION (Left: Ankle)     Patient location during evaluation: PACU Anesthesia Type: Regional and General Post-procedure mental status: sleeping. Pain management: pain level controlled Vital Signs Assessment: post-procedure vital signs reviewed and stable Respiratory status: spontaneous breathing, nonlabored ventilation, respiratory function stable and patient connected to nasal cannula oxygen Cardiovascular status: blood pressure returned to baseline and stable Postop Assessment: no apparent nausea or vomiting Anesthetic complications: no   No notable events documented.  Last Vitals:  Vitals:   09/28/21 1720 09/28/21 1735  BP: (!) 118/59 (!) 114/57  Pulse: 75 71  Resp: 14 10  Temp: 37.1 C   SpO2: 98% 99%    Last Pain:  Vitals:   09/28/21 1735  TempSrc:   PainSc: Asleep                 Catalina Gravel

## 2021-09-28 NOTE — Anesthesia Procedure Notes (Signed)
Anesthesia Regional Block: Adductor canal block   Pre-Anesthetic Checklist: , timeout performed,  Correct Patient, Correct Site, Correct Laterality,  Correct Procedure, Correct Position, site marked,  Risks and benefits discussed,  Surgical consent,  Pre-op evaluation,  At surgeon's request and post-op pain management  Laterality: Lower and Left  Prep: chloraprep       Needles:  Injection technique: Single-shot  Needle Type: Echogenic Needle     Needle Length: 9cm  Needle Gauge: 22     Additional Needles:   Procedures:,,,, ultrasound used (permanent image in chart),,    Narrative:  Start time: 09/28/2021 2:48 PM End time: 09/28/2021 2:54 PM Injection made incrementally with aspirations every 5 mL.  Performed by: Personally  Anesthesiologist: Barnet Glasgow, MD  Additional Notes: Block assessed prior to surgery. Pt tolerated procedure well.

## 2021-09-28 NOTE — Transfer of Care (Signed)
Immediate Anesthesia Transfer of Care Note  Patient: Natalie [name]  Procedure(s) Performed: OPEN TREATMENT OF LEFT TRIMALLEOLAR FRACTURE WITHOUT POSTERIOR FIXATION, OPEN TREATMENT OF LEFT SYNDESMOSIS DISRUPTION (Left: Ankle)  Patient Location: PACU  Anesthesia Type:General  Level of Consciousness: awake, alert  and oriented  Airway & Oxygen Therapy: Patient Spontanous Breathing and Patient connected to face mask oxygen  Post-op Assessment: Report given to RN and Post -op Vital signs reviewed and stable  Post vital signs: Reviewed and stable  Last Vitals:  Vitals Value Taken Time  BP 118/59 09/28/21 1718  Temp    Pulse 72 09/28/21 1721  Resp 11 09/28/21 1721  SpO2 98 % 09/28/21 1721  Vitals shown include unvalidated device data.  Last Pain:  Vitals:   09/28/21 1515  TempSrc:   PainSc: 0-No pain      Patients Stated Pain Goal: 2 (95/74/73 4037)  Complications: No notable events documented.

## 2021-09-28 NOTE — Progress Notes (Signed)
Patient presents to short stay with a cough.  She states that "her granddaughter got everyone sick."   Patient denies a fever or sore throat at this time.  Patient denies a recent covid test.  Dr. Valma Cava aware and gave verbal order to covid test patient.    Dr. Lucia Gaskins notified.

## 2021-09-28 NOTE — Anesthesia Procedure Notes (Addendum)
Anesthesia Regional Block: Popliteal block   Pre-Anesthetic Checklist: , timeout performed,  Correct Patient, Correct Site, Correct Laterality,  Correct Procedure, Correct Position, site marked,  Risks and benefits discussed,  Pre-op evaluation,  At surgeon's request and post-op pain management  Laterality: Lower and Left  Prep: Maximum Sterile Barrier Precautions used, chloraprep       Needles:  Injection technique: Single-shot  Needle Type: Echogenic Needle     Needle Length: 9cm  Needle Gauge: 21     Additional Needles:   Procedures:,,,, ultrasound used (permanent image in chart),,    Narrative:  Start time: 09/28/2021 2:40 PM End time: 09/28/2021 2:47 PM Injection made incrementally with aspirations every 5 mL.  Performed by: Personally  Anesthesiologist: Barnet Glasgow, MD  Additional Notes: Block assessed. Patient tolerated procedure well.

## 2021-09-28 NOTE — H&P (Signed)
PREOPERATIVE H&P  Chief Complaint: Left ankle pain  HPI: Natalie [name] is a 70 y.o. female who presents for preoperative history and physical with a diagnosis of left trimalleolar ankle fracture.  Patient is here today for surgery.  She had a fall and sustained the above injury.  She also had a minimally displaced femoral condyle fracture.  She was placed in a knee immobilizer and a splint for that. Symptoms are rated as moderate to severe, and have been worsening.  This is significantly impairing activities of daily living.  She has elected for surgical management.   Past Medical History:  Diagnosis Date   Arthritis    Atrophy of left kidney    Cervicalgia    Chronic kidney disease, stage 3 ([place])    acute aki 03-14-2018 in setting pyelonehritis-- resolved   Chronic migraine    Chronic pain syndrome    History of kidney stones    History of pyelonephritis    hx recurrent   History of recurrent UTIs    History of sepsis    07/ 2017 secondary to pyelonephritis   Hypertension    Hypothyroidism    Iron deficiency anemia    Left ureteral stone    Mixed hyperlipidemia    Nephrolithiasis    right side nonobstructive per CT 03-14-2018   Right shoulder pain    post sx 02-18-2018   Seizure disorder ([place]) followed by pcp   per pt dx age 43--  no seizure since 1980's , controlled w/ phenobarbitol   Stroke ([place]) 08/2020   no deficits   Type 2 diabetes mellitus ([place])    followed by pcp   Wears glasses    Past Surgical History:  Procedure Laterality Date   CESAREAN SECTION  1974   CHOLECYSTECTOMY OPEN  2002   CYSTO/  LEFT RETROGRADE PYELOGRAM  06-11-2006   dr Jeffie Pollock  [place], URETEROSCOPY AND STENT PLACEMENT Left 04/10/2018   Procedure: CYSTOSCOPY WITH RETROGRADE PYELOGRAM, URETEROSCOPY AND STENT PLACEMENT;  Surgeon: Franchot Gallo, MD;  [facility]: [facility];  Service: Urology;  Laterality: Left;   EYE SURGERY Bilateral     cataract removal   FOOT ARTHRODESIS Right 09/10/2019   Procedure: ARTHRODESIS FOOT;  Surgeon: Erle Crocker, MD;  Location: [place];  Service: Orthopedics;  Laterality: Right;   HARDWARE REMOVAL Right 12/27/2020   Procedure: DEEP ORTHOPEDIC HARDWARE REMOVAL, RIGHT FOOT;  Surgeon: Erle Crocker, MD;  Location: [place];  Service: Orthopedics;  Laterality: Right;   IR URETERAL STENT PLACEMENT EXISTING ACCESS RIGHT  08-21-2007    dr Karsten Ro  [facility]   LEFT RETROGRADE PYELOGRAM/ LEFT URETERAL STENT PLACEMENT  02-25-2001   dr Jeffie Pollock [facility]   NEPHROLITHOTOMY Right 05/14/2016   Procedure: NEPHROLITHOTOMY PERCUTANEOUS;  Surgeon: Franchot Gallo, MD;  Location: [facility];  Service: Urology;  Laterality: Right;   OPEN REDUCTION INTERNAL FIXATION (ORIF) FOOT LISFRANC FRACTURE Right 09/10/2019   Procedure: OPEN REDUCTION INTERNAL FIXATION (ORIF) FOOT LISFRANC FRACTURE;  Surgeon: Erle Crocker, MD;  Location: [place];  Service: Orthopedics;  Laterality: Right;   REPAIR EXTENSOR TENDON WITH METATARSAL OSTEOTOMY AND OPEN REDUCTION IN Right 09/10/2019   Procedure: OPEN TREATMENT RIGHT FIRST, SECOND, THIRD TARSOMETATARSAL LISFRANC, MIDFOOT FUSION;  Surgeon: Erle Crocker, MD;  Location: [place];  Service: Orthopedics;  Laterality: Right;   SHOULDER ARTHROSCOPY Bilateral left 11-13-2000;  right 02-18-2018   TONSILLECTOMY     Social  History   Socioeconomic History   Marital status: Married    Spouse name: Not on file   Number of children: 2   Years of education: 2 years of college   Highest education level: Not on file  Occupational History   Not on file  Tobacco Use   Smoking status: Never   Smokeless tobacco: Never  Vaping Use   Vaping Use: Never used  Substance and Sexual Activity   Alcohol use: No    Alcohol/week: 0.0 standard drinks   Drug use: No   Sexual activity: Not on file  Other Topics Concern    Not on file  Social History Narrative   ** Merged History Encounter **       Social Determinants of Health   Financial Resource Strain: Not on file  Food Insecurity: Not on file  Transportation Needs: Not on file  Physical Activity: Not on file  Stress: Not on file  Social Connections: Not on file   Family History  Problem Relation Age of Onset   Heart disease Father    Diabetes Father    Allergies  Allergen Reactions   Compazine Other (See Comments)    Bad headache, vomiting; IV    Cymbalta [Duloxetine Hcl] Other (See Comments)    "Kept me awake"   Methadone Hives   Escitalopram Anxiety   Macrobid [Nitrofurantoin Monohyd Macro] Rash   Nitrofuran Derivatives Rash   Prior to Admission medications   Medication Sig Start Date End Date Taking? Authorizing Provider  amLODipine (NORVASC) 10 MG tablet Take 10 mg by mouth daily.   Yes [provider]  aspirin EC 81 MG tablet Take 1 tablet (81 mg total) by mouth daily. Swallow whole. Patient taking differently: Take 81 mg by mouth 2 (two) times a week. Swallow whole. 11/10/20  Yes McCue, Janett Billow, NP  atorvastatin (LIPITOR) 80 MG tablet Take 80 mg by mouth daily. 04/04/21  Yes [provider]  BD INSULIN SYRINGE U/F 31G X 5/16" 0.3 ML MISC Use 1 syringe as directed twice a day 09/27/20  Yes [provider]  butorphanol (STADOL) 10 MG/ML nasal spray PLACE 1 SPRAY INTO THE NOSE EVERY 4 HOURS AS NEEDED FOR MIGRAINE. Patient taking differently: Place 1 spray into the nose every 4 (four) hours as needed for headache or migraine. PLACE 1 SPRAY INTO THE NOSE EVERY 4 HOURS AS NEEDED FOR MIGRAINE. 10/08/18  Yes Shawnee Knapp, MD  gabapentin (NEURONTIN) 300 MG capsule Take 1 capsule (300 mg total) by mouth 3 (three) times daily. 07/27/21  Yes McCue, Janett Billow, NP  glipiZIDE (GLUCOTROL) 10 MG tablet Take 1 tablet (10 mg total) by mouth daily before breakfast. Patient taking differently: Take 20 mg by mouth 2 (two) times daily  before a meal. 09/15/20  Yes Love, Ivan Anchors, PA-C  insulin NPH Human (NOVOLIN N) 100 UNIT/ML injection Inject 20 Units into the skin in the morning and at bedtime.   Yes [provider]  levothyroxine (SYNTHROID) 50 MCG tablet Take 50 mcg by mouth daily before breakfast.   Yes [provider]  PHENobarbital (LUMINAL) 97.2 MG tablet TAKE 2 TABLETS BY MOUTH ONCE DAILY ON MON,WED,AND FRI AND 1 TABLET DAILY ON OTHER DAYS Patient taking differently: Take 97.2-194.4 mg by mouth See admin instructions. TAKE 2 TABLETS (194.4 MG TOTALLY) BY MOUTH ON MON, FRI, SUN; TAKE 1 TABLET (97.2 MG TOTALLY) DAILY ON OTHER DAYS 08/07/18  Yes Shawnee Knapp, MD  tiZANidine (ZANAFLEX) 4 MG tablet TAKE 1  TABLET BY MOUTH EVERY 4-6 HOURS AS NEEDED FOR MUSCLE SPASM Patient taking differently: Take 4 mg by mouth every 4 (four) hours as needed for muscle spasms. 01/08/19  Yes Stallings, Zoe A, MD  zolpidem (AMBIEN) 10 MG tablet take 1 tablet by mouth at bedtime for sleep if needed. Patient taking differently: Take 10 mg by mouth at bedtime as needed for sleep. take 1 tablet by mouth at bedtime for sleep if needed. 05/08/18  Yes Shawnee Knapp, MD     Positive ROS: All other systems have been reviewed and were otherwise negative with the exception of those mentioned in the HPI and as above.  Physical Exam:  Vitals:   09/28/21 1334  BP: (!) 173/72  Pulse: 89  Resp: 17  Temp: 98 F (36.7 C)  SpO2: 96%   General: Alert, no acute distress Cardiovascular: No pedal edema Respiratory: No cyanosis, no use of accessory musculature GI: No organomegaly, abdomen is soft and non-tender Skin: No lesions in the area of chief complaint Neurologic: Sensation intact distally Psychiatric: Patient is competent for consent with normal mood and affect Lymphatic: No axillary or cervical lymphadenopathy  MUSCULOSKELETAL: Left lower extremity demonstrates short leg splint in place.  Knee immobilizer in place.  Toes exposed are warm  well perfused.  Tenderness about the knee.  No gross instability noted.  Foot is warm and well-perfused.  Assessment: Left trimalleolar ankle fracture and femoral condyle fracture   Plan: Plan for open treatment of her trimalleolar ankle fracture and likely nonoperative treatment of her femoral condyle fracture.  We discussed the risks, benefits and alternatives of surgery which include but are not limited to wound healing complications, infection, nonunion, malunion, need for further surgery, damage to surrounding structures and continued pain.  They understand there is no guarantees to an acceptable outcome.  After weighing these risks they opted to proceed with surgery.     Erle Crocker, MD    09/28/2021 2:57 PM

## 2021-09-28 NOTE — Discharge Instructions (Signed)

## 2021-09-28 NOTE — Anesthesia Procedure Notes (Signed)
Procedure Name: LMA Insertion Date/Time: 09/28/2021 3:49 PM Performed by: Alain Marion, CRNA Pre-anesthesia Checklist: Patient identified, Emergency Drugs available, Suction available and Patient being monitored Patient Re-evaluated:Patient Re-evaluated prior to induction Oxygen Delivery Method: Circle System Utilized Preoxygenation: Pre-oxygenation with 100% oxygen Induction Type: IV induction LMA: LMA inserted LMA Size: 4.0 Number of attempts: 1 Airway Equipment and Method: Bite block Placement Confirmation: positive ETCO2 Tube secured with: Tape Dental Injury: Teeth and Oropharynx as per pre-operative assessment

## 2021-09-29 ENCOUNTER — Encounter (HOSPITAL_COMMUNITY): Payer: Self-pay | Admitting: Orthopaedic Surgery

## 2021-09-29 NOTE — Op Note (Signed)
Natalie [name] 70 y.o. 09/28/2021  PreOperative Diagnosis: Left trimalleolar ankle fracture Left syndesmotic disruption Intra-articular distal femoral condyle fracture  PostOperative Diagnosis: Left trimalleolar ankle fracture Left syndesmotic disruption Intra-articular distal femoral condyle fracture  PROCEDURE: Open treatment of left trimalleolar ankle fracture without posterior fixation Open treatment of syndesmosis Ankle stress view fluoroscopy Closed treatment of left distal femoral condyle fracture, intra-articular  SURGEON: Melony Overly, MD  ASSISTANT: Natalie Martinique, PA-C: His assistance was necessary for prep and drape, exposure, holding retractors, revisional fixation and placement of hardware, wound closure and splinting.   ANESTHESIA: General LMA anesthesia with peripheral nerve block  FINDINGS: Displaced trimalleolar ankle fracture with syndesmotic disruption  IMPLANTS: Arthrex distal fibular locking plate One third tubular buttress plate medially  ZWCHENIDPOE:70 y.o. [name] had a fall at home and presented with significant left ankle pain and swelling in right knee pain.  X-rays revealed a trimalleolar ankle fracture on the left and a femoral condyle fracture on the right.  CT scan was ordered of the right knee.  She is indicated for surgery due to displaced and unstable ankle fracture pattern.  Patient understood the risks, benefits and alternatives to surgery which include but are not limited to wound healing complications, infection, nonunion, malunion, need for further surgery as well as damage to surrounding structures. They also understood the potential for continued pain in that there were no guarantees of acceptable outcome After weighing these risks the patient opted to proceed with surgery.  PROCEDURE: Patient was identified in the preoperative holding area.  The left ankle and right knee was marked by myself.  Consent was signed by myself and  the patient.  Block was performed by anesthesia in the preoperative holding area.  Patient was taken to the operative suite and placed supine on the operative table.  General LMA anesthesia was induced without difficulty. Bump was placed under the operative hip and bone foam was used.  All bony prominences were well padded.  Tourniquet was placed on the operative thigh.  Preoperative antibiotics were given. The extremity was prepped and draped in the usual sterile fashion and surgical timeout was performed.  The limb was elevated and the tourniquet was inflated to 250 mmHg.  We began by making a longitudinal incision overlying the distal fibula.  This was taken sharply down through skin and subcutaneous tissue.  Blunt dissection was used to identify any branch of the superficial peroneal nerve was not identified.  The incision was then taken sharply down to bone and the fracture site was identified.  The fracture site was mobilized.   The fracture site were cleaned with a rondure and curette of any fracture hematoma and callus formation.  Then the fracture of the fibula was reduced under direct visualization and held provisionally with a lobster claw.  Then fluoroscopy confirmed adequate reduction of the ankle mortise at that time.  Then a combination of locking and nonlocking screws were used.  The fracture was nearly transverse and not amenable to lag screw fixation.This provided good stability of the distal fibula fracture.    We then turned our attention to the medial malleolus.  There was continued displacement of the medial malleolus and therefore an incision was made overlying this.  This was taken sharply down through skin and subcutaneous tissue.  Bovie cautery was used for skin bleeders.  Then sharp dissection down to the fracture and the fracture site was identified.  The soft tissue flap was carried anteriorly to identify the medial gutter  of the ankle joint.  Then the fracture site was mobilized and  using a curette and rondure the fracture was cleared out of hematoma and fracture callus.  The fracture was a vertical shear type fracture.  The fracture was reduced and held provisionally with K wire fixation.  Fluoroscopy confirmed appropriate reduction.  Then a one third tubular plate was used in a buttress fashion to fixate the fracture with several nonlocking screws.This was done under direct visualization.  Then fluoroscopy confirmed adequate reduction.    Then under fluoroscopy the ankle was stressed and found to be unstable with regard to medial clear space widening or syndesmotic widening.    We proceeded with syndesmosis fixation.  The syndesmosis was directly visualized after separate deep incision was created to gain access to the syndesmosis.  The Weber clamp was placed across the syndesmosis to stabilize it and provisionally reduce it.  Then a solid 3.5 millimeter screw was placed through the fibular plate across the syndesmosis and a Quadra cortical fashion.  This stabilized the syndesmosis adequately.  Fluoroscopic images confirmed appropriate position of the screw and syndesmosis.  Final fluoroscopic images were obtained.  The wounds were irrigated and the subcuticular tissue was closed with 3-0 Monocryl and the skin with staples.  Xeroform was placed on the wounds as well as 4 x 4's and sterile sheet cotton.  The tourniquet was released.    Patient was placed in a nonweightbearing short leg splint.  Patient tolerated the procedure well.  There were no complications.  Patient was awakened from anesthesia and taken recovery in stable condition.  POST OPERATIVE INSTRUCTIONS: Nonweightbearing on operative extremity Keep splint dry and limb elevated Continue 325 mg aspirin for DVT prophylaxis Call the office with concerns Follow-up in 2 weeks for splint removal, x-rays of the operative ankle, nonweightbearing and suture removal if appropriate.    TOURNIQUET TIME:101 minutes  BLOOD  LOSS:  Minimal         DRAINS: none         SPECIMEN: none       COMPLICATIONS:  * No complications entered in OR log *         Disposition: PACU - hemodynamically stable.         Condition: stable

## 2021-10-31 NOTE — Telephone Encounter (Signed)
FYI

## 2021-10-31 NOTE — Telephone Encounter (Signed)
Pt's PCP called in stating that pt told them she'd been having trouble reaching [place]. Informed them that we have not heard from pt since November when we last reached out to her. Called pt and LVM trying to inform her of below info. If she calls back, please inform her that new referral is needed for [place] to take over prescribing her phenytoin.

## 2021-10-31 NOTE — Telephone Encounter (Signed)
Pt returned phone call. Informed pt of previous note as instructed. Patient verbalized understand and will call PCP to send a referral to take over her Phenytoin.

## 2021-12-07 IMAGING — RF DG FOOT COMPLETE 3+V*R*
1 series · 3 of 3 positions shown · non-contrast
Comparison: None.

CLINICAL DATA: ORIF right foot Lisfranc fixation

EXAM:
RIGHT FOOT COMPLETE - 3+ VIEW

[Series 1: run · 3 of 3 slices shown]
[im 1/3]
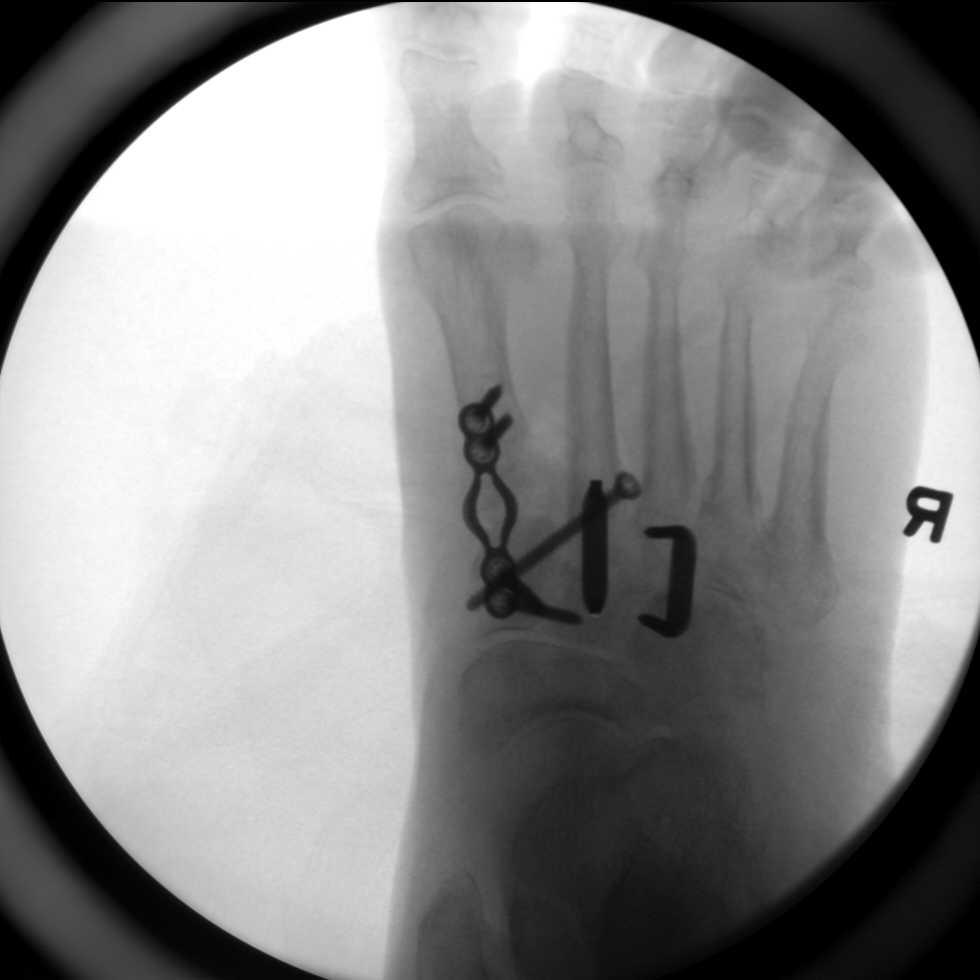
[im 2/3]
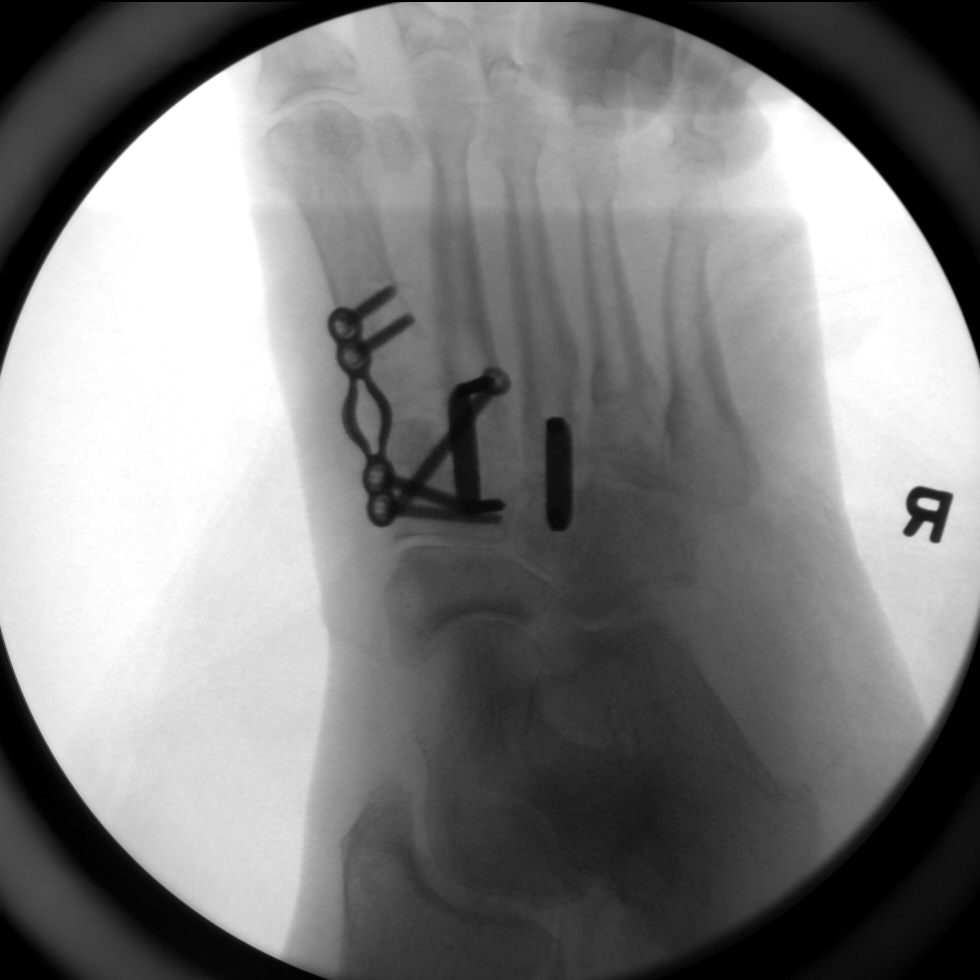
[im 3/3]
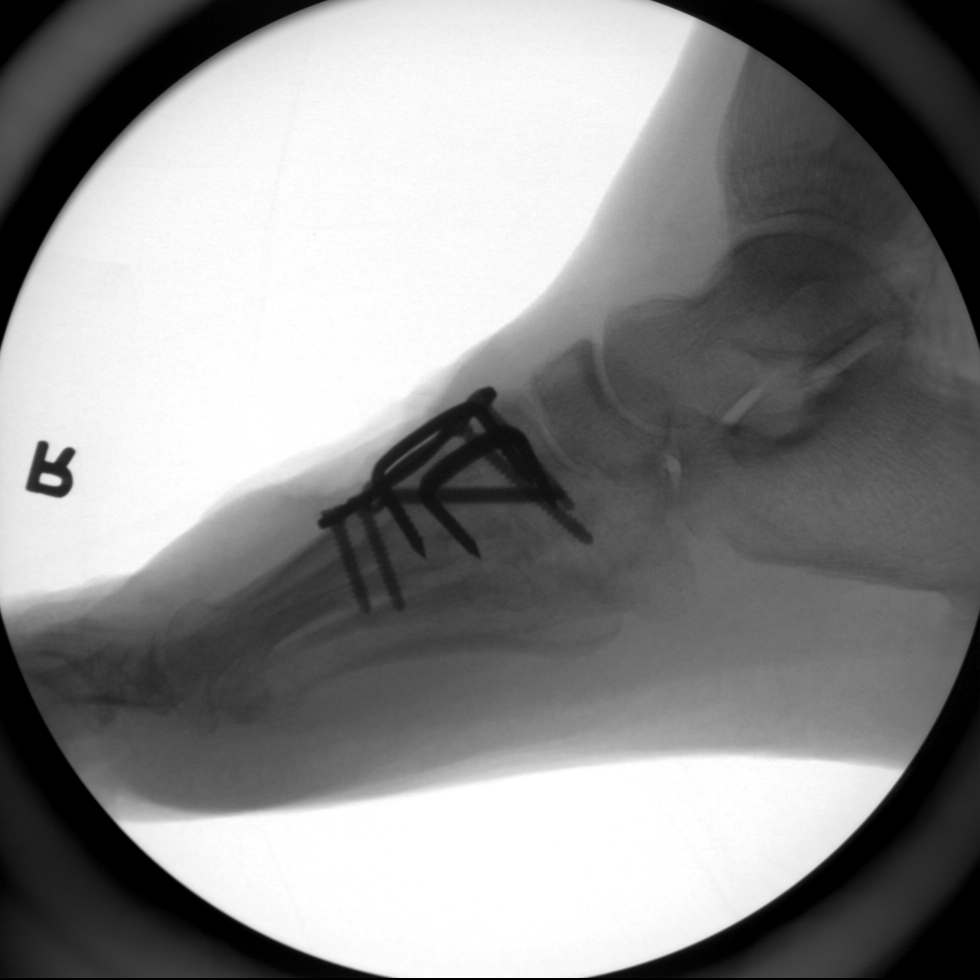

[3 of 3 positions shown; findings below may reference images not displayed]

FINDINGS: Frontal and lateral intraoperative fluoroscopic images.

Postsurgical hardware transfixing the 1st, 2nd, and 3rd
tarsometatarsal joints. Underlying fractures, including involving
the metatarsal bases, cuneiform bones, and cuboid, better evaluated
on CT.

Screw transfixing the Lisfranc joint.
IMPRESSION: Intraoperative fluoroscopic images demonstrating hardware as above.

## 2022-01-29 ENCOUNTER — Encounter: Payer: Self-pay | Admitting: Adult Health

## 2022-01-29 ENCOUNTER — Ambulatory Visit (INDEPENDENT_AMBULATORY_CARE_PROVIDER_SITE_OTHER): Payer: Medicare Other | Admitting: Adult Health

## 2022-01-29 VITALS — BP 146/74 | HR 83 | Ht <= 58 in | Wt 164.0 lb

## 2022-01-29 DIAGNOSIS — I619 Nontraumatic intracerebral hemorrhage, unspecified: Secondary | ICD-10-CM

## 2022-01-29 MED ORDER — GABAPENTIN 300 MG PO CAPS: 300.0000 mg | ORAL_CAPSULE | Freq: Every day | ORAL | 5 refills | Status: DC | PRN

## 2022-01-29 NOTE — Progress Notes (Signed)
?[facility] ?[address] ?[place]. [place] [address] ?[phone] ? ?     STROKE FOLLOW UP NOTE ? ?Natalie [name] ?Date of Birth:  05-Mar-1951 ?Medical Record Number:  [id number]  ? ?Reason for Referral: stroke follow up ? ? ? ?SUBJECTIVE: ? ? ?CHIEF COMPLAINT:  ?Chief Complaint  ?Patient presents with  ? Follow-up  ?  RM 3 with daughter Wilburn Cornelia ?Pt is well, had a bad fall and broke her L leg/ankle.   ? ? ?HPI:  ? ?Update 01/29/2022 JM: Patient returns for 41-month stroke follow-up accompanied by her daughter.  Overall stable without new stroke/TIA symptoms.  Reports continued left-sided dysesthesias currently on gabapentin 300 mg 3 times daily. Does have some days where pain is worse. Unable to say if any improvement since starting gabapentin after prior visit but she did unfortunately suffer a fall approx 2 mo after resulting in left trimalleolar ankle fracture and femoral condyle fracture requiring open treatment for trimalleolar fracture on 12/29. She is working with [facility] currently doing PT. Continues to use RW, no additional falls.  Compliant on aspirin and atorvastatin, denies side effects.  Blood pressure today 146/74.  Routinely monitors at home, typically 130s.  She has since establish care with new PCP with completion of lab work today.  No new concerns at this time. ? ? ? ? ?History provided for reference purposes only ?Update 07/27/2021 JM: returns for 6 month stroke follow up accompanied by her daughter.  Overall stable without new stroke/TIA symptoms.  Reports continued left sided numbness/tingling. Left sided pain has been interfering with sleep, walking and any type of activity. At prior visit, declined interest in any medication management.  Also right foot numbness which she believes is from prior foot surgery.  She is currently using rolling walker due to worsening right foot pain.  She has had 3 falls since prior visit but thankfully without injury.  Compliant on  aspirin and atorvastatin without side effects.  Blood pressure today 156/82.  Routinely monitors at home and typically stable.  Glucose levels stable per report.  Plans on obtaining lab work with PCP in the next week.  No further concerns at this time. ? ? ?Update 01/25/2021 JM: Natalie [name] returns for 33-month stroke follow-up accompanied by her daughter and infant great granddaughter ? ?Stable since prior visit without new stroke/TIA symptoms.  Residual left sided numbness/tingling (thigh to foot and hand). Possibly improving but denies worsening.  Was seen by Dr. Letta Pate 2/15 and placed on low-dose gabapentin but she self discontinued as she did not feel as though this was beneficial.  She declines interest in any further medication management She also continues to have gait instability but also recently underwent right foot hardware removal on 3/33/5456 without complication -she has been slowly recovering from this. ? ?Reports compliance on aspirin 81 mg daily and atorvastatin without associated side effects ?Blood pressure today 151/91 - monitors at home and typically lower ?Glucose levels stable at home per patient report ?She has not had any recent lab ? ?No new concerns at this time ? ?Initial visit 10/26/2020 JM: Natalie [name] is being seen for hospital follow-up unaccompanied. She was discharged home from [facility] on 09/15/2020 with recommendation of [facility]. She reports residual left foot numbness and gait impairment. She also reports right foot fracture s/p surgical procedure 08/2020 with planned hardware removal tomorrow but due to recent stroke, procedure delayed.  She believes this is also be contributing to her gait impairment.  Denies new or worsening stroke/TIA symptoms. She remains on atorvastatin 20 mg daily without myalgias. Blood pressure today 158/78 - monitors at home which typically stable . Glucose levels stable at home. Reports repeat A1c with PCP which was at 7.9.  No concerns at this time. ? ?Stroke  admission 09/02/2020 ?Natalie [name] is a 71 y.o. female with history of chronic headaches chronic migraine, chronic numbness BLE, chronic pain syndrome, CKD, hypertension, hyperlipidemia, type 2 diabetes, and seizures since the age of 40, who presented to [facility] ED on 09/02/2020 with left sided weakness and mild headache. Personally reviewed hospitalization pertinent progress notes, lab work and imaging with summary provided. Evaluated by Dr. Leonie Man with stroke work-up revealing acute right corona radiata IPH with mild surrounding edema and trace IVH, etiology likely hypertensive. History of HTN with long-term BP goal normotensive range. History of HLD on atorvastatin 40 mg daily with LDL 69 therefore decrease dosage to 20 mg daily in setting of recent IPH. History of DM with A1c 8.1 on insulin and glipizide. Other stroke risk factors include advanced age, obesity, migraines and prior strokes by imaging. Other active problems include CKD and seizure disorder on phenobarbital. Evaluated by therapies and discharged to CIR on 09/06/2020 for ongoing therapy needs and residual left-sided weakness and balance impairment. ? ? ?Stroke: acute R corona radiata IPH with mild surrounding edema and trace IVH-etiology likely hypertensive ?CT head -  2 cc acute hematoma in the right corona radiata. There is early extension into the right lateral ventricle. Background chronic small vessel disease.  ?CT head repeat - Unchanged appearance of right basal ganglia intraparenchymal hemorrhage.  ?MRI head - Acute R corona radiata IPH with mild surrounding edema and trace IVH. No evidence of underlying lesion. Few chronic microhemorrhages. Mild to moderate chronic microvascular ischemic changes. Chronic small vessel infarcts ?CTA H&N -no large vessel stenosis or occlusion no aneurysm or AVM. ?2D Echo - EF 60-65%. No source of embolus   ?[employer] Virus 2 - negative ?LDL - 69 ?HgbA1c - 8.1 ?UDS - (not collected)  ?VTE prophylaxis - SCDs ?No  antithrombotic prior to admission, now on No antithrombotic ICH ?Therapy recommendations:  CIR  ?Disposition:  CIR ? ? ? ? ? ?ROS:   ?14 system review of systems performed and negative with exception of those listed in HPI ? ?PMH:  ?Past Medical History:  ?Diagnosis Date  ? Arthritis   ? Atrophy of left kidney   ? Cervicalgia   ? Chronic kidney disease, stage 3 (HCC)   ? acute aki 03-14-2018 in setting pyelonehritis-- resolved  ? Chronic migraine   ? Chronic pain syndrome   ? History of kidney stones   ? History of pyelonephritis   ? hx recurrent  ? History of recurrent UTIs   ? History of sepsis   ? 07/ 2017 secondary to pyelonephritis  ? Hypertension   ? Hypothyroidism   ? Iron deficiency anemia   ? Left ureteral stone   ? Mixed hyperlipidemia   ? Nephrolithiasis   ? right side nonobstructive per CT 03-14-2018  ? Right shoulder pain   ? post sx 02-18-2018  ? Seizure disorder ([place]) followed by pcp  ? per pt dx age 28--  no seizure since 1980's , controlled w/ phenobarbitol  ? Stroke [facility]) 08/2020  ? no deficits  ? Type 2 diabetes mellitus ([place])   ? followed by pcp  ? Wears glasses   ? ? ?PSH:  ?Past Surgical History:  ?Procedure  Laterality Date  ? [place]  ? CHOLECYSTECTOMY OPEN  2002  ? CYSTO/  LEFT RETROGRADE PYELOGRAM  06-11-2006   dr Jeffie Pollock  [facility]  ? CYSTOSCOPY WITH RETROGRADE PYELOGRAM, URETEROSCOPY AND STENT PLACEMENT Left 04/10/2018  ? Procedure: CYSTOSCOPY WITH RETROGRADE PYELOGRAM, URETEROSCOPY AND STENT PLACEMENT;  Surgeon: Franchot Gallo, MD;  Location: [facility];  Service: Urology;  Laterality: Left;  ? EYE SURGERY Bilateral   ? cataract removal  ? FOOT ARTHRODESIS Right 09/10/2019  ? Procedure: ARTHRODESIS FOOT;  Surgeon: Erle Crocker, MD;  Location: [place];  Service: Orthopedics;  Laterality: Right;  ? HARDWARE REMOVAL Right 12/27/2020  ? Procedure: DEEP ORTHOPEDIC HARDWARE REMOVAL, RIGHT FOOT;  Surgeon: Erle Crocker, MD;  Location:  [place];  Service: Orthopedics;  Laterality: Right;  ? IR URETERAL STENT PLACEMENT EXISTING ACCESS RIGHT  08-21-2007    dr Karsten [facility]  ? LEFT RETROGRADE PYELOGRAM/ LEFT URETERAL STENT

## 2022-01-29 NOTE — Patient Instructions (Signed)
Continue gabapentin 300mg  three times daily - can take an extra 300mg  capsule for increased pain as needed ? ?Continue aspirin and atorvastatin, ensure routine follow up with PCP for stroke risk factor management ? ? ? ?Follow up in 6 months or call earlier if needed ?

## 2022-05-22 ENCOUNTER — Telehealth: Payer: Self-pay | Admitting: Adult Health

## 2022-05-22 NOTE — Telephone Encounter (Signed)
Thank you. This was discussed at our prior visit as well.

## 2022-05-22 NOTE — Telephone Encounter (Signed)
Pt states her PCP's office has informed her they are not willing to fill her PHENobarbital (LUMINAL) 97.2 MG tablet, Pt states she has been on this medication since the age of 26.  Pt is asking if Janett Billow, NP will write another Rx for her PHENobarbital (LUMINAL) 97.2 MG tablet, she is down to 4 pills.

## 2022-05-22 NOTE — Telephone Encounter (Signed)
The message from 08/22/21 was repeated back to pt. Pt said she will just have to wait and see and ended the call, this is FYI, no call back requested.

## 2022-05-22 NOTE — Telephone Encounter (Signed)
Janett Billow, NP has already addressed this in a phone note from 08/22/21:   "Patient has been on phenytoin for many years for seizure prevention which was managed by PCP. We have seen her for stroke follow up but did not discuss hx of seizures or use of phenytoin. As she has been stable on this medication, there is no reason PCP cannot continue to manage. IF PCP wishes for [place] to take over this care, they will have to place a new referral for new issue. Thank you."  Lattie Haw, can you let the patient know this?

## 2022-06-07 ENCOUNTER — Other Ambulatory Visit: Payer: Self-pay

## 2022-06-07 ENCOUNTER — Emergency Department (HOSPITAL_COMMUNITY): Payer: Medicare Other

## 2022-06-07 ENCOUNTER — Emergency Department (HOSPITAL_COMMUNITY)
Admission: EM | Admit: 2022-06-07 | Discharge: 2022-06-07 | Discharge status: Against Medical Advice | Payer: Medicare Other | Attending: Student | Admitting: Student

## 2022-06-07 DIAGNOSIS — Z5321 Procedure and treatment not carried out due to patient leaving prior to being seen by health care provider: Secondary | ICD-10-CM | POA: Insufficient documentation

## 2022-06-07 DIAGNOSIS — Z20822 Contact with and (suspected) exposure to covid-19: Secondary | ICD-10-CM | POA: Diagnosis not present

## 2022-06-07 DIAGNOSIS — I10 Essential (primary) hypertension: Secondary | ICD-10-CM | POA: Diagnosis not present

## 2022-06-07 DIAGNOSIS — H538 Other visual disturbances: Secondary | ICD-10-CM | POA: Diagnosis not present

## 2022-06-07 LAB — COMPREHENSIVE METABOLIC PANEL
ALT: 35 U/L (ref 0–44)
AST: 25 U/L (ref 15–41)
Albumin: 4 g/dL (ref 3.5–5.0)
Alkaline Phosphatase: 102 U/L (ref 38–126)
Anion gap: 7 (ref 5–15)
BUN: 19 mg/dL (ref 8–23)
CO2: 23 mmol/L (ref 22–32)
Calcium: 9.6 mg/dL (ref 8.9–10.3)
Chloride: 109 mmol/L (ref 98–111)
Creatinine, Ser: 1.04 mg/dL — ABNORMAL HIGH (ref 0.44–1.00)
GFR, Estimated: 57 mL/min — ABNORMAL LOW (ref >60)
Glucose, Bld: 174 mg/dL — ABNORMAL HIGH (ref 70–99)
Potassium: 3.9 mmol/L (ref 3.5–5.1)
Sodium: 139 mmol/L (ref 135–145)
Total Bilirubin: 0.5 mg/dL (ref 0.3–1.2)
Total Protein: 7.4 g/dL (ref 6.5–8.1)

## 2022-06-07 LAB — CBC
HCT: 44.3 % (ref 36.0–46.0)
Hemoglobin: 14.3 g/dL (ref 12.0–15.0)
MCH: 31.2 pg (ref 26.0–34.0)
MCHC: 32.3 g/dL (ref 30.0–36.0)
MCV: 96.7 fL (ref 80.0–100.0)
Platelets: 269 10*3/uL (ref 150–400)
RBC: 4.58 MIL/uL (ref 3.87–5.11)
RDW: 13.3 % (ref 11.5–15.5)
WBC: 8.3 10*3/uL (ref 4.0–10.5)
nRBC: 0 % (ref 0.0–0.2)

## 2022-06-07 LAB — DIFFERENTIAL
Abs Immature Granulocytes: 0.01 10*3/uL (ref 0.00–0.07)
Basophils Absolute: 0 10*3/uL (ref 0.0–0.1)
Basophils Relative: 1 %
Eosinophils Absolute: 0.2 10*3/uL (ref 0.0–0.5)
Eosinophils Relative: 2 %
Immature Granulocytes: 0 %
Lymphocytes Relative: 16 %
Lymphs Abs: 1.4 10*3/uL (ref 0.7–4.0)
Monocytes Absolute: 0.7 10*3/uL (ref 0.1–1.0)
Monocytes Relative: 8 %
Neutro Abs: 6.1 10*3/uL (ref 1.7–7.7)
Neutrophils Relative %: 73 %

## 2022-06-07 LAB — I-STAT CHEM 8, ED
BUN: 18 mg/dL (ref 8–23)
Calcium, Ion: 1.23 mmol/L (ref 1.15–1.40)
Chloride: 106 mmol/L (ref 98–111)
Creatinine, Ser: 0.9 mg/dL (ref 0.44–1.00)
Glucose, Bld: 180 mg/dL — ABNORMAL HIGH (ref 70–99)
HCT: 44 % (ref 36.0–46.0)
Hemoglobin: 15 g/dL (ref 12.0–15.0)
Potassium: 3.9 mmol/L (ref 3.5–5.1)
Sodium: 139 mmol/L (ref 135–145)
TCO2: 26 mmol/L (ref 22–32)

## 2022-06-07 LAB — RESP PANEL BY RT-PCR (FLU A&B, COVID) ARPGX2
Influenza A by PCR: NEGATIVE
Influenza B by PCR: NEGATIVE
SARS Coronavirus 2 by RT PCR: NEGATIVE

## 2022-06-07 LAB — ETHANOL: Alcohol, Ethyl (B): <10 mg/dL (ref <10)

## 2022-06-07 NOTE — ED Notes (Signed)
Pt called mutiple times no answer 

## 2022-06-07 NOTE — ED Provider Triage Note (Signed)
Emergency Medicine Provider Triage Evaluation Note  Natalie [name] , a 71 y.o. female  was evaluated in triage.  Pt complains of possible stroke symptoms urgent care.  Starting 4 days ago patient has been unable to see out of left eye.  Not on blood thinners.  Review of Systems  Per HPI  Physical Exam  BP (!) 148/92 (BP Location: Right Wrist)   Pulse 86   Temp 98 F (36.7 C) (Oral)   Resp 18   SpO2 96%  Gen:   Awake, no distress   Resp:  Normal effort  MSK:   Moves extremities without difficulty  Other:  Left eye unable to do lateral gaze.  Pupils are reactive, grips and equal bilaterally no pronator drift.  Medical Decision Making  Medically screening exam initiated at 3:14 PM.  Appropriate orders placed.  Natalie [name] was informed that the remainder of the evaluation will be completed by another provider, this initial triage assessment does not replace that evaluation, and the importance of remaining in the ED until their evaluation is complete.  Outside of stroke window but stroke work-up initiated.   Sherrill Raring, PA-C 06/07/22 1515

## 2022-06-07 NOTE — ED Triage Notes (Addendum)
Pt here from home for blurry vision in L eye x4 days. Pt reports some pain behind L eye, has hx of HTN compliant w/ all meds. Pt has no unilateral weakness, uses wheelchair at baseline.

## 2022-07-11 ENCOUNTER — Telehealth: Payer: Self-pay | Admitting: Adult Health

## 2022-07-11 NOTE — Telephone Encounter (Signed)
Spoke with representative from [facility]. Stated they did send a referral back in May for management of seizure meds as she did not feel comfortable taking this over and wanted [place] to handle that and well as headaches. Asked for referral to be resent to [place] and once received will reach out to the patient to get her scheduled.

## 2022-07-11 NOTE — Telephone Encounter (Signed)
Pt  is calling. Requesting a refill on medication  PHENobarbital (LUMINAL) 97.2 MG tablet. Refill should be sent to  [id number]

## 2022-07-11 NOTE — Telephone Encounter (Signed)
As previously advised, PCP will need to continue to refill as we have not previously seen her for seizures and she has been on phenytoin for many years even prior to her stroke and this was managed by her PCP previously. If referral has been sent for this, she can be seen by Dr. Leonie Man. She is scheduled to see my 10/31 for stroke f/u but this can be cancelled if she has been stable as she will be seeing Dr. Leonie Man soon. Thank you.

## 2022-07-11 NOTE — Telephone Encounter (Signed)
Called patient and reviewed NP's message with her. She stated she's also been seen by PCP for bells palsy, needs to be seen for that. I advised her she'll need referral for that as well. She stated she still needs medicine. I advised she call PCP, request the referral for seizures to be sent and refill to be sent until she can be seen here for seizures.  Patient verbalized understanding, appreciation.

## 2022-07-11 NOTE — Telephone Encounter (Signed)
Called patient and reviewed previous note with her: she has been on phenytoin for many years for seizure prevention which was managed by PCP. We have seen her for stroke follow up but did not discuss hx of seizures or use of phenytoin. As she has been stable on this medication, there is no reason PCP cannot continue to manage. IF PCP wishes for [place] to take over this care, they will have to place a new referral for new issue. She stated she only has 3 phenytoin left.  She stated PCP faxed referral Sept 7th. I advised her I;ll ask referral team to check on this, however I do not see referral in Epic. She will call her PCP back. She understands that she will need  to see MD if she is referred here for seizure management. Patient verbalized understanding, appreciation.

## 2022-07-19 ENCOUNTER — Encounter: Payer: Self-pay | Admitting: Neurology

## 2022-07-19 ENCOUNTER — Ambulatory Visit (INDEPENDENT_AMBULATORY_CARE_PROVIDER_SITE_OTHER): Payer: Medicare Other | Admitting: Neurology

## 2022-07-19 ENCOUNTER — Telehealth: Payer: Self-pay | Admitting: Neurology

## 2022-07-19 VITALS — BP 134/77 | HR 76

## 2022-07-19 DIAGNOSIS — H4922 Sixth [abducent] nerve palsy, left eye: Secondary | ICD-10-CM

## 2022-07-19 DIAGNOSIS — G43109 Migraine with aura, not intractable, without status migrainosus: Secondary | ICD-10-CM

## 2022-07-19 DIAGNOSIS — Z8673 Personal history of transient ischemic attack (TIA), and cerebral infarction without residual deficits: Secondary | ICD-10-CM

## 2022-07-19 DIAGNOSIS — G40909 Epilepsy, unspecified, not intractable, without status epilepticus: Secondary | ICD-10-CM | POA: Diagnosis not present

## 2022-07-19 DIAGNOSIS — H532 Diplopia: Secondary | ICD-10-CM | POA: Diagnosis not present

## 2022-07-19 MED ORDER — PHENOBARBITAL 97.2 MG PO TABS: ORAL_TABLET | ORAL | 3 refills | Status: DC

## 2022-07-19 NOTE — Telephone Encounter (Signed)
medicare/[place] NPR sent to GI [phone] 

## 2022-07-19 NOTE — Patient Instructions (Signed)
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  Continue Tylenol for symptomatic relief of migraines and they do not appear to be frequent enough to justify prophylactic medications at the present time.  Continue to use her walker at all times discussed fall safety precautions.  She will return for follow-up in the future with [facility] practitioner in 6 months or call earlier if necessary.  Diplopia Diplopia is a condition in which a person sees two of a single object. It is also called double vision. There are two types of diplopia. Monocular diplopia. This is double vision that is present when only one eye is open. It can occur in one or both eyes. Monocular diplopia is often caused by irregularity in the surface layer of your eye (cornea), a clouding of the lens in your eye (cataract), or a problem in the way your eye focuses light. Binocular diplopia. This is double vision that is present only when both eyes are open. When you close one eye, the double vision will go away. Binocular diplopia may be more serious. It can be caused by: Problems with the nerves or muscles that are responsible for eye movement. Disease of the nerves (neurologic disease). Immune system conditions, such as Graves' disease. Migraine headaches. Tumors. An infection. A stroke. An injury. There are many causes of diplopia. Some are not dangerous and can be easily corrected. Diplopia may also be a symptom of a serious medical problem. You may need to see a health care provider who specializes in eye conditions (ophthalmologist) or a nerve specialist (neurologist) to find  the cause. Follow these instructions at home:  Pay attention to any changes in your vision. Tell your health care provider about them. Do not drive or operate machinery if diplopia interferes with your vision. Keep all follow-up visits. This is important. Contact a health care provider if: Your diplopia gets worse. You develop any other symptoms along with your diplopia, such as: Weakness. Numbness. Headache. Eye pain. Clumsiness. Nausea. Drooping eyelids. Abnormal movement of one eye. Get help right away if: You have sudden vision loss. You suddenly get a very bad headache. You have sudden weakness or numbness. You develop droopiness on one side of your face. You suddenly lose the ability to speak, understand speech, or both. You develop difficulty breathing. These symptoms may represent a serious problem that is an emergency. Do not wait to see if the symptoms will go away. Get medical help right away. Call your local emergency services (911 in the [place] Do not drive yourself to the hospital. Summary Diplopia is a condition in which a person sees two of a single object. It is also called double vision. Monocular diplopia is double vision that affects only one eye at a time. It is often caused by irregularity in the surface layer of your eye (cornea), a clouding of the lens in your eye (cataract), or a problem in the way your eye focuses light. Binocular diplopia is double vision that affects both eyes at the same time. However, when you shut one eye, the double vision will go away. Binocular diplopia may be more serious. If  you have diplopia, you may need to see a health care provider who specializes in eye conditions (ophthalmologist) or a nerve specialist (neurologist) to find the cause. This information is not intended to replace advice given to you by your health care provider. Make sure you discuss any questions you have with your health care provider. Document Revised:  01/19/2021 Document Reviewed: 01/19/2021 Elsevier Patient Education  [place].

## 2022-07-19 NOTE — Progress Notes (Signed)
[facility] [address] [place]. [place] [address] [phone]       STROKE FOLLOW UP NOTE  [name] Date of Birth:  06-12-1951 Medical Record Number:  [id number]   Reason for Referral: stroke follow up    SUBJECTIVE:   CHIEF COMPLAINT:  Chief Complaint  Patient presents with   New Patient (Initial Visit)    Rm 15 with husband. Reports she is here to discuss seizure     HPI:  Update 07/19/2022 : Patient returns for follow-up after last visit with Natalie [name] nurse practitioner 5 months ago.  He was asked to see me as he was seen in the ER on 06/07/2022 for sudden onset of diplopia.  He states that the double vision was binocular and more when she was looking to the left.  Was seen in the ER and was found to have mild eye pain as well.  CT scan of the head was obtained on 06/07/2022 which was negative for acute abnormality.  Patient was subsequently seen on ophthalmologist seems to be helping.  Denies any headaches, slurred speech, loss of vision no focal weakness or gait or balance problems.  He has had no other stroke or TIA symptoms. Phenobarbital for seizures but has not had seizures for more than 23 years.  Willing to consider reducing the dose of her phenobarbital   .for her migraines in the past she has used stadol nasal spray as needed but she nhas ran out of the prescription and her PCP is no longer willing to prescribe it..  Migraine headaches however appear quite infrequent not more than 2 times a month and over-the-counter medications like Tylenol also seem to work.  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  She is able to ambulate with a walker but states about 4.  She said no falls or injuries.  Also has history of migraines Update 01/29/2022 JM: Patient returns for 61-month stroke follow-up accompanied by her daughter.  Overall stable without new stroke/TIA symptoms.  Reports continued  left-sided dysesthesias currently on gabapentin 300 mg 3 times daily. Does have some days where pain is worse. Unable to say if any improvement since starting gabapentin after prior visit but she did unfortunately suffer a fall approx 2 mo after resulting in left trimalleolar ankle fracture and femoral condyle fracture requiring open treatment for trimalleolar fracture on 12/29. She is working with [facility] currently doing PT. Continues to use RW, no additional falls.  Compliant on aspirin and atorvastatin, denies side effects.  Blood pressure today 146/74.  Routinely monitors at home, typically 130s.  She has since establish care with new PCP with completion of lab work today.  No new concerns at this time.     History provided for reference purposes only Update 07/27/2021 JM: returns for 6 month stroke follow up accompanied by her daughter.  Overall stable without new stroke/TIA symptoms.  Reports continued left sided numbness/tingling. Left sided pain has been interfering with sleep, walking and any type of activity. At prior visit, declined interest in any medication management.  Also right foot numbness which she believes is from prior foot surgery.  She is currently using rolling walker due to worsening right foot pain.  She has had 3 falls since prior visit but thankfully without injury.  Compliant on aspirin and atorvastatin without side effects.  Blood pressure today 156/82.  Routinely monitors at home and typically stable.  Glucose levels stable per  report.  Plans on obtaining lab work with PCP in the next week.  No further concerns at this time.   Update 01/25/2021 JM: [name] returns for 53-month stroke follow-up accompanied by her daughter and infant great granddaughter  Stable since prior visit without new stroke/TIA symptoms.  Residual left sided numbness/tingling (thigh to foot and hand). Possibly improving but denies worsening.  Was seen by Dr. Letta Pate 2/15 and placed on low-dose  gabapentin but she self discontinued as she did not feel as though this was beneficial.  She declines interest in any further medication management She also continues to have gait instability but also recently underwent right foot hardware removal on 2/95/6213 without complication -she has been slowly recovering from this.  Reports compliance on aspirin 81 mg daily and atorvastatin without associated side effects Blood pressure today 151/91 - monitors at home and typically lower Glucose levels stable at home per patient report She has not had any recent lab  No new concerns at this time  Initial visit 10/26/2020 JM: [name] is being seen for hospital follow-up unaccompanied. She was discharged home from CIR on 09/15/2020 with recommendation of [facility] PT. She reports residual left foot numbness and gait impairment. She also reports right foot fracture s/p surgical procedure 08/2020 with planned hardware removal tomorrow but due to recent stroke, procedure delayed.  She believes this is also be contributing to her gait impairment.  Denies new or worsening stroke/TIA symptoms. She remains on atorvastatin 20 mg daily without myalgias. Blood pressure today 158/78 - monitors at home which typically stable . Glucose levels stable at home. Reports repeat A1c with PCP which was at 7.9.  No concerns at this time.  Stroke admission 09/02/2020 [name] is a 71 y.o. female with history of chronic headaches chronic migraine, chronic numbness BLE, chronic pain syndrome, CKD, hypertension, hyperlipidemia, type 2 diabetes, and seizures since the age of 56, who presented to [facility] ED on 09/02/2020 with left sided weakness and mild headache. Personally reviewed hospitalization pertinent progress notes, lab work and imaging with summary provided. Evaluated by Dr. Leonie Man with stroke work-up revealing acute right corona radiata IPH with mild surrounding edema and trace IVH, etiology likely hypertensive. History of HTN with  long-term BP goal normotensive range. History of HLD on atorvastatin 40 mg daily with LDL 69 therefore decrease dosage to 20 mg daily in setting of recent IPH. History of DM with A1c 8.1 on insulin and glipizide. Other stroke risk factors include advanced age, obesity, migraines and prior strokes by imaging. Other active problems include CKD and seizure disorder on phenobarbital. Evaluated by therapies and discharged to CIR on 09/06/2020 for ongoing therapy needs and residual left-sided weakness and balance impairment.   Stroke: acute R corona radiata IPH with mild surrounding edema and trace IVH-etiology likely hypertensive..   CT head -  2 cc acute hematoma in the right corona radiata. There is early extension into the right lateral ventricle. Background chronic small vessel disease.  CT head repeat - Unchanged appearance of right basal ganglia intraparenchymal hemorrhage.  MRI head - Acute R corona radiata IPH with mild surrounding edema and trace IVH. No evidence of underlying lesion. Few chronic microhemorrhages. Mild to moderate chronic microvascular ischemic changes. Chronic small vessel infarcts CTA H&N -no large vessel stenosis or occlusion no aneurysm or AVM. 2D Echo - EF 60-65%. No source of embolus   [employer] Virus 2 - negative LDL - 69 HgbA1c - 8.1 UDS - (not collected)  VTE prophylaxis - SCDs No antithrombotic prior to admission, now on No antithrombotic ICH Therapy recommendations:  CIR  Disposition:  CIR      ROS:   14 system review of systems performed and negative with exception of those listed in HPI  PMH:  Past Medical History:  Diagnosis Date   Arthritis    Atrophy of left kidney    Cervicalgia    Chronic kidney disease, stage 3 ([place])    acute aki 03-14-2018 in setting pyelonehritis-- resolved   Chronic migraine    Chronic pain syndrome    History of kidney stones    History of pyelonephritis    hx recurrent   History of recurrent UTIs    History of  sepsis    07/ 2017 secondary to pyelonephritis   Hypertension    Hypothyroidism    Iron deficiency anemia    Left ureteral stone    Mixed hyperlipidemia    Nephrolithiasis    right side nonobstructive per CT 03-14-2018   Right shoulder pain    post sx 02-18-2018   Seizure disorder ([place]) followed by pcp   per pt dx age 2--  no seizure since 1980's , controlled w/ phenobarbitol   Stroke ([place]) 08/2020   no deficits   Type 2 diabetes mellitus ([place])    followed by pcp   Wears glasses     PSH:  Past Surgical History:  Procedure Laterality Date   CESAREAN SECTION  1974   CHOLECYSTECTOMY OPEN  2002   CYSTO/  LEFT RETROGRADE PYELOGRAM  06-11-2006   dr Jeffie Pollock  [place], URETEROSCOPY AND STENT PLACEMENT Left 04/10/2018   Procedure: CYSTOSCOPY WITH RETROGRADE PYELOGRAM, URETEROSCOPY AND STENT PLACEMENT;  Surgeon: Franchot Gallo, MD;  Location: [facility];  Service: Urology;  Laterality: Left;   EYE SURGERY Bilateral    cataract removal   FOOT ARTHRODESIS Right 09/10/2019   Procedure: ARTHRODESIS FOOT;  Surgeon: Erle Crocker, MD;  Location: [facility];  Service: Orthopedics;  Laterality: Right;   HARDWARE REMOVAL Right 12/27/2020   Procedure: DEEP ORTHOPEDIC HARDWARE REMOVAL, RIGHT FOOT;  Surgeon: Erle Crocker, MD;  Location: [facility];  Service: Orthopedics;  Laterality: Right;   IR URETERAL STENT PLACEMENT EXISTING ACCESS RIGHT  08-21-2007    dr Karsten Ro  [facility]   LEFT RETROGRADE PYELOGRAM/ LEFT URETERAL STENT PLACEMENT  02-25-2001   dr Jeffie Pollock [facility]   NEPHROLITHOTOMY Right 05/14/2016   Procedure: NEPHROLITHOTOMY PERCUTANEOUS;  Surgeon: Franchot Gallo, MD;  Location: [facility];  Service: Urology;  Laterality: Right;   OPEN REDUCTION INTERNAL FIXATION (ORIF) FOOT LISFRANC FRACTURE Right 09/10/2019   Procedure: OPEN REDUCTION INTERNAL FIXATION (ORIF) FOOT LISFRANC FRACTURE;  Surgeon: Erle Crocker, MD;  Location: [facility];  Service: Orthopedics;  Laterality: Right;   ORIF ANKLE FRACTURE Left 09/28/2021   Procedure: OPEN TREATMENT OF LEFT TRIMALLEOLAR FRACTURE WITHOUT POSTERIOR FIXATION, OPEN TREATMENT OF LEFT SYNDESMOSIS DISRUPTION;  Surgeon: Erle Crocker, MD;  Location: [facility];  Service: Orthopedics;  Laterality: Left;   REPAIR EXTENSOR TENDON WITH METATARSAL OSTEOTOMY AND OPEN REDUCTION IN Right 09/10/2019   Procedure: OPEN TREATMENT RIGHT FIRST, SECOND, THIRD TARSOMETATARSAL LISFRANC, MIDFOOT FUSION;  Surgeon: Erle Crocker, MD;  Location: [facility];  Service: Orthopedics;  Laterality: Right;   SHOULDER ARTHROSCOPY Bilateral left 11-13-2000;  right 02-18-2018   TONSILLECTOMY      Social History:  Social History   Socioeconomic History  Marital status: Married    Spouse name: Not on file   Number of children: 2   Years of education: 2 years of college   Highest education level: Not on file  Occupational History   Not on file  Tobacco Use   Smoking status: Never   Smokeless tobacco: Never  Vaping Use   Vaping Use: Never used  Substance and Sexual Activity   Alcohol use: No    Alcohol/week: 0.0 standard drinks of alcohol   Drug use: No   Sexual activity: Not on file  Other Topics Concern   Not on file  Social History Narrative   ** Merged History Encounter **       Social Determinants of Health   Financial Resource Strain: Not on file  Food Insecurity: Not on file  Transportation Needs: Not on file  Physical Activity: Not on file  Stress: Not on file  Social Connections: Not on file  Intimate Partner Violence: Not on file    Family History:  Family History  Problem Relation Age of Onset   Heart disease Father    Diabetes Father     Medications:   Current Outpatient Medications on File Prior to Visit  Medication Sig Dispense Refill   amLODipine (NORVASC) 10 MG tablet Take 10 mg by mouth daily.      aspirin EC 81 MG tablet Take 4 tablets (325 mg total) by mouth daily. Swallow whole. 30 tablet 0   atorvastatin (LIPITOR) 80 MG tablet Take 80 mg by mouth daily.     BD INSULIN SYRINGE U/F 31G X 5/16" 0.3 ML MISC Use 1 syringe as directed twice a day     glipiZIDE (GLUCOTROL) 10 MG tablet Take 1 tablet (10 mg total) by mouth daily before breakfast. (Patient taking differently: Take 20 mg by mouth at bedtime. 2 X nightly)     insulin NPH Human (NOVOLIN N) 100 UNIT/ML injection Inject 20 Units into the skin in the morning and at bedtime.     levothyroxine (SYNTHROID) 50 MCG tablet Take 50 mcg by mouth daily before breakfast.     OZEMPIC, 0.25 OR 0.5 MG/DOSE, 2 MG/3ML SOPN Inject into the skin.     tiZANidine (ZANAFLEX) 4 MG tablet TAKE 1 TABLET BY MOUTH EVERY 4-6 HOURS AS NEEDED FOR MUSCLE SPASM (Patient taking differently: Take 4 mg by mouth every 4 (four) hours as needed for muscle spasms.) 180 tablet 1   zolpidem (AMBIEN) 10 MG tablet take 1 tablet by mouth at bedtime for sleep if needed. (Patient taking differently: Take 10 mg by mouth at bedtime as needed for sleep. take 1 tablet by mouth at bedtime for sleep if needed.) 90 tablet 1   No current facility-administered medications on file prior to visit.    Allergies:   Allergies  Allergen Reactions   Compazine Other (See Comments)    Bad headache, vomiting; IV    Cymbalta [Duloxetine Hcl] Other (See Comments)    "Kept me awake"   Methadone Hives   Escitalopram Anxiety   Macrobid [Nitrofurantoin Monohyd Macro] Rash   Nitrofuran Derivatives Rash      OBJECTIVE:  Physical Exam  Vitals:   07/19/22 0929  BP: 134/77  Pulse: 76   There is no height or weight on file to calculate BMI. No results found.   General: well developed, well nourished, very pleasant elderly Caucasian female, seated, in no evident distress Head: head normocephalic and atraumatic.   Neck: supple with no carotid or supraclavicular  bruits Cardiovascular: regular rate and rhythm, no murmurs Musculoskeletal: no deformity Skin:  no rash/petichiae Vascular:  Normal pulses all extremities   Neurologic Exam Mental Status: Awake and fully alert. Fluent speech and language. Oriented to place and time. Recent and remote memory intact. Attention span, concentration and fund of knowledge appropriate. Mood and affect appropriate.  Cranial Nerves: Pupils equal, briskly reactive to light. 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  Esotropia of the left eye with no subjective diplopia on testing today. Visual fields full to confrontation. Hearing intact. Facial sensation intact. Face, tongue, palate moves normally and symmetrically.  Motor: Normal bulk and tone. Normal strength in all tested extremity muscles .  Diminished fine finger movements on the left.  Orbits right over left upper extremity. Sensory.: reports altered light touch sensation LLE compared to RLE, intact BUE Coordination: Rapid alternating movements normal in all extremities. Finger-to-nose and heel-to-shin performed accurately bilaterally. Gait and Station: Arises from chair without difficulty. Stance is normal. Gait demonstrates slow cautious steps with slightly decreased stride length with mild unsteadiness/imbalance with RW.  Tandem walk and heel toe not attempted Reflexes: 1+ and symmetric. Toes downgoing.       ASSESSMENT: Natalie [name] is a 71 y.o. year old female presented with left-sided weakness and mild headache on 09/02/2020 with stroke work-up revealing acute right corona radiata IPH with mild surrounding edema and trace IVH, likely etiology hypertensive. Vascular risk factors include HTN, HLD, DM, obesity, migraines, seizure disorder and prior strokes on imaging.  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  Continue Tylenol for symptomatic relief of migraines and they do not appear to be frequent enough to justify prophylactic medications at the present time.  Continue to use her walker at all times discussed fall safety precautions.  She will return for follow-up in the future with [place] practitioner in 6 months or call earlier if necessary.  I spent 45 minutes of face-to-face and non-face-to-face time with patient and husband  This included previsit chart review, lab review, study review, order entry, electronic health record documentation, patient education and discussion regarding prior stroke with residual deficits, use of gabapentin and potential side effects, aggressive stroke risk factor management and secondary stroke prevention measures and answered all other questions to patient and daughter satisfaction   Antony Contras, MD  [facility] [address] [place], [place] [address]  Phone [phone] Fax [phone] Note: This document was prepared with digital dictation and possible smart phrase technology. Any transcriptional errors that result from this process are unintentional.

## 2022-07-31 ENCOUNTER — Ambulatory Visit: Payer: Medicare Other | Admitting: Adult Health

## 2022-08-02 ENCOUNTER — Ambulatory Visit: Payer: Medicare Other | Admitting: Adult Health

## 2022-08-11 ENCOUNTER — Ambulatory Visit
Admission: RE | Admit: 2022-08-11 | Discharge: 2022-08-11 | Disposition: A | Discharge status: Home / Self Care | Payer: Medicare Other | Source: Ambulatory Visit | Attending: Neurology | Admitting: Neurology

## 2022-08-11 DIAGNOSIS — H532 Diplopia: Secondary | ICD-10-CM | POA: Diagnosis not present

## 2022-08-11 MED ORDER — GADOPICLENOL 0.5 MMOL/ML IV SOLN
7.5000 mL | Freq: Once | INTRAVENOUS | Status: AC | PRN
  Administered 2022-08-11: 7.5 mL via INTRAVENOUS

## 2022-08-16 NOTE — Progress Notes (Signed)
Kindly inform the patient that MRI scan of the brain shows a couple of old small strokes in the deep portions of the brain on both sides.  No new strokes or worrisome findings.

## 2022-08-20 ENCOUNTER — Telehealth: Payer: Self-pay

## 2022-08-20 NOTE — Telephone Encounter (Signed)
I called patient. I discussed her MRI brain results. I reminded her of her April appointment and of general healthy lifestyle recommendations. Pt verbalized understanding of results. Pt had no questions at this time but was encouraged to call back if questions arise.

## 2022-08-20 NOTE — Telephone Encounter (Signed)
-----   Message from Garvin Fila, MD sent at 08/16/2022  4:43 PM EST ----- Kindly inform the patient that MRI scan of the brain shows a couple of old small strokes in the deep portions of the brain on both sides.  No new strokes or worrisome findings.

## 2022-12-12 ENCOUNTER — Ambulatory Visit (INDEPENDENT_AMBULATORY_CARE_PROVIDER_SITE_OTHER): Payer: Medicare Other | Admitting: Family Medicine

## 2022-12-12 ENCOUNTER — Encounter: Payer: Self-pay | Admitting: Family Medicine

## 2022-12-12 VITALS — BP 149/76 | HR 76 | Wt 156.0 lb

## 2022-12-12 DIAGNOSIS — Z01419 Encounter for gynecological examination (general) (routine) without abnormal findings: Secondary | ICD-10-CM

## 2022-12-12 NOTE — Progress Notes (Signed)
   Subjective:    Patient ID: Natalie [name] is a 72 y.o. female presenting with Establish Care  on 12/12/2022  HPI: Menopause age 72. No h/o abnormal pap smears. No PMB. Here to establish care, though has no GYN needs. Declines mammograms, due to being wheel chair bound since her CVA.  Review of Systems  Constitutional:  Negative for chills and fever.  Respiratory:  Negative for shortness of breath.   Cardiovascular:  Negative for chest pain.  Gastrointestinal:  Negative for abdominal pain, nausea and vomiting.  Genitourinary:  Negative for dysuria.  Skin:  Negative for rash.      Objective:    BP (!) 149/76   Pulse 76   Wt 156 lb (70.8 kg)   BMI 34.97 kg/m  Physical Exam Exam conducted with a chaperone present.  Constitutional:      General: She is not in acute distress.    Appearance: She is well-developed.  HENT:     Head: Normocephalic and atraumatic.  Eyes:     General: No scleral icterus. Cardiovascular:     Rate and Rhythm: Normal rate.  Pulmonary:     Effort: Pulmonary effort is normal.  Abdominal:     Palpations: Abdomen is soft.  Musculoskeletal:     Cervical back: Neck supple.  Skin:    General: Skin is warm and dry.  Neurological:     Mental Status: She is alert and oriented to person, place, and time.         Assessment & Plan:  Encounter for gynecological examination - at her age, no new partner and no h/o abnl pap, does not need routine GYN caredesires PCP referral.  Return if symptoms worsen or fail to improve, for Needs PCP referral to [facility].  Donnamae Jude, MD 12/12/2022 9:43 AM

## 2022-12-12 NOTE — Progress Notes (Signed)
CC: pt requesting to establish care with GYN as her previous is a far drive.  S/P stroke 09/02/20

## 2022-12-24 ENCOUNTER — Emergency Department (HOSPITAL_COMMUNITY): Payer: Medicare Other

## 2022-12-24 ENCOUNTER — Emergency Department (HOSPITAL_COMMUNITY)
Admission: EM | Admit: 2022-12-24 | Discharge: 2022-12-24 | Disposition: A | Discharge status: Home / Self Care | Payer: Medicare Other | Attending: Emergency Medicine | Admitting: Emergency Medicine

## 2022-12-24 DIAGNOSIS — M25552 Pain in left hip: Secondary | ICD-10-CM | POA: Insufficient documentation

## 2022-12-24 DIAGNOSIS — Z794 Long term (current) use of insulin: Secondary | ICD-10-CM | POA: Diagnosis not present

## 2022-12-24 DIAGNOSIS — Z8673 Personal history of transient ischemic attack (TIA), and cerebral infarction without residual deficits: Secondary | ICD-10-CM | POA: Insufficient documentation

## 2022-12-24 DIAGNOSIS — Z85038 Personal history of other malignant neoplasm of large intestine: Secondary | ICD-10-CM | POA: Insufficient documentation

## 2022-12-24 DIAGNOSIS — E1122 Type 2 diabetes mellitus with diabetic chronic kidney disease: Secondary | ICD-10-CM | POA: Insufficient documentation

## 2022-12-24 DIAGNOSIS — R531 Weakness: Secondary | ICD-10-CM | POA: Insufficient documentation

## 2022-12-24 DIAGNOSIS — Z7982 Long term (current) use of aspirin: Secondary | ICD-10-CM | POA: Diagnosis not present

## 2022-12-24 DIAGNOSIS — E039 Hypothyroidism, unspecified: Secondary | ICD-10-CM | POA: Diagnosis not present

## 2022-12-24 DIAGNOSIS — Z7984 Long term (current) use of oral hypoglycemic drugs: Secondary | ICD-10-CM | POA: Diagnosis not present

## 2022-12-24 DIAGNOSIS — I129 Hypertensive chronic kidney disease with stage 1 through stage 4 chronic kidney disease, or unspecified chronic kidney disease: Secondary | ICD-10-CM | POA: Insufficient documentation

## 2022-12-24 DIAGNOSIS — R200 Anesthesia of skin: Secondary | ICD-10-CM | POA: Diagnosis not present

## 2022-12-24 DIAGNOSIS — Z79899 Other long term (current) drug therapy: Secondary | ICD-10-CM | POA: Diagnosis not present

## 2022-12-24 DIAGNOSIS — D72829 Elevated white blood cell count, unspecified: Secondary | ICD-10-CM | POA: Diagnosis not present

## 2022-12-24 DIAGNOSIS — N183 Chronic kidney disease, stage 3 unspecified: Secondary | ICD-10-CM | POA: Insufficient documentation

## 2022-12-24 DIAGNOSIS — S32009A Unspecified fracture of unspecified lumbar vertebra, initial encounter for closed fracture: Secondary | ICD-10-CM

## 2022-12-24 LAB — CBC WITH DIFFERENTIAL/PLATELET
Abs Immature Granulocytes: 0.02 10*3/uL (ref 0.00–0.07)
Basophils Absolute: 0 10*3/uL (ref 0.0–0.1)
Basophils Relative: 0 %
Eosinophils Absolute: 0.1 10*3/uL (ref 0.0–0.5)
Eosinophils Relative: 2 %
HCT: 40.3 % (ref 36.0–46.0)
Hemoglobin: 13 g/dL (ref 12.0–15.0)
Immature Granulocytes: 0 %
Lymphocytes Relative: 19 %
Lymphs Abs: 1.5 10*3/uL (ref 0.7–4.0)
MCH: 31.1 pg (ref 26.0–34.0)
MCHC: 32.3 g/dL (ref 30.0–36.0)
MCV: 96.4 fL (ref 80.0–100.0)
Monocytes Absolute: 0.7 10*3/uL (ref 0.1–1.0)
Monocytes Relative: 9 %
Neutro Abs: 5.3 10*3/uL (ref 1.7–7.7)
Neutrophils Relative %: 70 %
Platelets: 253 10*3/uL (ref 150–400)
RBC: 4.18 MIL/uL (ref 3.87–5.11)
RDW: 12.2 % (ref 11.5–15.5)
WBC: 7.6 10*3/uL (ref 4.0–10.5)
nRBC: 0 % (ref 0.0–0.2)

## 2022-12-24 LAB — URINALYSIS, ROUTINE W REFLEX MICROSCOPIC
Bilirubin Urine: NEGATIVE
Glucose, UA: NEGATIVE mg/dL
Hgb urine dipstick: NEGATIVE
Ketones, ur: NEGATIVE mg/dL
Nitrite: NEGATIVE
Protein, ur: NEGATIVE mg/dL
Specific Gravity, Urine: 1.026 (ref 1.005–1.030)
pH: 7 (ref 5.0–8.0)

## 2022-12-24 LAB — COMPREHENSIVE METABOLIC PANEL
ALT: 29 U/L (ref 0–44)
AST: 29 U/L (ref 15–41)
Albumin: 3.7 g/dL (ref 3.5–5.0)
Alkaline Phosphatase: 96 U/L (ref 38–126)
Anion gap: 14 (ref 5–15)
BUN: 17 mg/dL (ref 8–23)
CO2: 24 mmol/L (ref 22–32)
Calcium: 10.2 mg/dL (ref 8.9–10.3)
Chloride: 101 mmol/L (ref 98–111)
Creatinine, Ser: 0.93 mg/dL (ref 0.44–1.00)
GFR, Estimated: >60 mL/min (ref >60)
Glucose, Bld: 130 mg/dL — ABNORMAL HIGH (ref 70–99)
Potassium: 3.9 mmol/L (ref 3.5–5.1)
Sodium: 139 mmol/L (ref 135–145)
Total Bilirubin: 0.5 mg/dL (ref 0.3–1.2)
Total Protein: 7.3 g/dL (ref 6.5–8.1)

## 2022-12-24 LAB — CK: Total CK: 42 U/L (ref 38–234)

## 2022-12-24 MED ORDER — OXYCODONE-ACETAMINOPHEN 5-325 MG PO TABS
1.0000 | ORAL_TABLET | Freq: Once | ORAL | Status: AC
  Administered 2022-12-24: 1 via ORAL
  Filled 2022-12-24: qty 1

## 2022-12-24 MED ORDER — IOHEXOL 350 MG/ML SOLN
75.0000 mL | Freq: Once | INTRAVENOUS | Status: AC | PRN
  Administered 2022-12-24: 75 mL via INTRAVENOUS

## 2022-12-24 MED ORDER — HYDROMORPHONE HCL 1 MG/ML IJ SOLN
0.5000 mg | Freq: Once | INTRAMUSCULAR | Status: AC
  Administered 2022-12-24: 0.5 mg via INTRAVENOUS
  Filled 2022-12-24: qty 1

## 2022-12-24 MED ORDER — OXYCODONE-ACETAMINOPHEN 5-325 MG PO TABS: 1.0000 | ORAL_TABLET | Freq: Four times a day (QID) | ORAL | 0 refills | Status: DC | PRN

## 2022-12-24 MED ORDER — ONDANSETRON HCL 4 MG/2ML IJ SOLN
4.0000 mg | Freq: Once | INTRAMUSCULAR | Status: AC
  Administered 2022-12-24: 4 mg via INTRAVENOUS
  Filled 2022-12-24: qty 2

## 2022-12-24 MED ORDER — SODIUM CHLORIDE 0.9 % IV BOLUS
1000.0000 mL | Freq: Once | INTRAVENOUS | Status: AC
  Administered 2022-12-24: 1000 mL via INTRAVENOUS

## 2022-12-24 NOTE — ED Notes (Signed)
Pt stated she need to urinate. Placed pt on the bedpan and pt was unable to urinate

## 2022-12-24 NOTE — ED Provider Notes (Signed)
[facility] Provider Note  CSN: [id number] Arrival date & time: 12/24/22 [id number]  Chief Complaint(s) Hip Pain  HPI Natalie [name] is a 72 y.o. 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  Prior CVA with left-sided weakness, numbness.  Reports she went to bed last night around 730 to midnight, woke up this morning around 9 AM with worsening left-sided weakness and numbness from her baseline.  Also pain to left hip w/ radiation down left hip towards her foot, denies any falls or recent trauma. No abd pain/nausea/vomiting/bowel or bladder function changes.  Denies similar symptoms in the past.  Compliant with home medications.  No blood thinners.  No speech changes vision changes, does have chronic gait abnormality secondary to prior CVA and prior ankle fractures.  Essentially unchanged last 24 hours.  No vision changes last 24 hours.  Follows with Dr. Leonie Man neurology  Past Medical History Past Medical History:  Diagnosis Date   Arthritis    Atrophy of left kidney    Cervicalgia    Chronic kidney disease, stage 3 ([place])    acute aki 03-14-2018 in setting pyelonehritis-- resolved   Chronic migraine    Chronic pain syndrome    History of kidney stones    History of pyelonephritis    hx recurrent   History of recurrent UTIs    History of sepsis    07/ 2017 secondary to pyelonephritis   Hypertension    Hypothyroidism    Iron deficiency anemia    Left ureteral stone    Mixed hyperlipidemia    Nephrolithiasis    right side nonobstructive per CT 03-14-2018   Right shoulder pain    post sx 02-18-2018   Seizure disorder ([place]) followed by pcp   per pt dx age 41--  no seizure since 1980's , controlled w/  phenobarbitol   Stroke ([place]) 08/2020   no deficits   Type 2 diabetes mellitus ([place])    followed by pcp   Wears glasses    Patient Active Problem List   Diagnosis Date Noted   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 HTN 09/02/2020   Chronic pain syndrome 03/16/2018   Colon cancer screening [id number]   Complicated UTI (urinary tract infection) 03/14/2018   Acute renal failure superimposed on stage 3 chronic kidney disease ([place]) 03/14/2018   Hypotension 03/14/2018   Generalized weakness    Chronic kidney disease 09/06/2016   Anemia, iron deficiency 06/23/2016   Vitamin B12 deficiency (dietary) anemia 06/23/2016   Migraine, chronic, without aura, intractable, with status migrainosus 01/04/2016   Abdominal pain    Pyelonephritis 08/31/2014   Persistent microalbuminuria associated with type 2 diabetes mellitus ([place]) 12/28/2013   Renal stone 05/14/2013   BMI 33.0-33.9,adult 03/30/2013   Recurrent UTI 09/17/2012   Diabetes mellitus, type II ([place]) 03/19/2012   HTN (hypertension) 03/19/2012   Hyperlipemia 03/19/2012   Hypothyroidism 03/19/2012   Seizure disorder ([place]) 03/19/2012   Insomnia 03/19/2012   Migraines 03/19/2012   Home Medication(s) Prior to Admission medications   Medication Sig Start Date End Date Taking? Authorizing Provider  amLODipine (NORVASC) 10 MG tablet Take 10 mg by mouth daily.    [provider]  aspirin EC 81 MG tablet Take 4 tablets (325 mg  total) by mouth daily. Swallow whole. 09/28/21   Martinique, Jesse J, PA-C  atorvastatin (LIPITOR) 80 MG tablet Take 80 mg by mouth daily. 04/04/21   [provider]  BD INSULIN SYRINGE U/F 31G X 5/16" 0.3 ML MISC Use 1 syringe as directed twice a day 09/27/20   [provider]  glipiZIDE (GLUCOTROL) 10 MG tablet Take 1 tablet (10 mg total) by mouth daily before breakfast. Patient taking differently: Take 20 mg by mouth at bedtime. 2 X nightly 09/15/20   Love, Ivan Anchors, PA-C   insulin NPH Human (NOVOLIN N) 100 UNIT/ML injection Inject 20 Units into the skin in the morning and at bedtime.    [provider]  levothyroxine (SYNTHROID) 50 MCG tablet Take 50 mcg by mouth daily before breakfast.    [provider]  losartan (COZAAR) 25 MG tablet Take 25 mg by mouth daily. 11/14/22   [provider]  OZEMPIC, 0.25 OR 0.5 MG/DOSE, 2 MG/3ML SOPN Inject into the skin. 07/17/22   [provider]  PHENobarbital (LUMINAL) 97.2 MG tablet TAKE 1 TABLET BY MOUTH ONCE DAILY 07/19/22   Garvin Fila, MD  tiZANidine (ZANAFLEX) 4 MG tablet TAKE 1 TABLET BY MOUTH EVERY 4-6 HOURS AS NEEDED FOR MUSCLE SPASM Patient taking differently: Take 4 mg by mouth every 4 (four) hours as needed for muscle spasms. 01/08/19   Forrest Moron, MD  zolpidem (AMBIEN) 10 MG tablet take 1 tablet by mouth at bedtime for sleep if needed. Patient taking differently: Take 10 mg by mouth at bedtime as needed for sleep. take 1 tablet by mouth at bedtime for sleep if needed. 05/08/18   Shawnee Knapp, MD                                                                                                                                    Past Surgical History Past Surgical History:  Procedure Laterality Date   CESAREAN SECTION  1974   CHOLECYSTECTOMY OPEN  2002   CYSTO/  LEFT RETROGRADE PYELOGRAM  06-11-2006   dr Jeffie Pollock  [place], URETEROSCOPY AND STENT PLACEMENT Left 04/10/2018   Procedure: CYSTOSCOPY WITH RETROGRADE PYELOGRAM, URETEROSCOPY AND STENT PLACEMENT;  Surgeon: Franchot Gallo, MD;  Location: [facility];  Service: Urology;  Laterality: Left;   EYE SURGERY Bilateral    cataract removal   FOOT ARTHRODESIS Right 09/10/2019   Procedure: ARTHRODESIS FOOT;  Surgeon: Erle Crocker, MD;  Location: [facility];  Service: Orthopedics;  Laterality: Right;   HARDWARE REMOVAL Right 12/27/2020   Procedure: DEEP  ORTHOPEDIC HARDWARE REMOVAL, RIGHT FOOT;  Surgeon: Erle Crocker, MD;  Location: [facility];  Service: Orthopedics;  Laterality: Right;   IR URETERAL STENT PLACEMENT EXISTING ACCESS RIGHT  08-21-2007    dr Karsten Ro  [place] PYELOGRAM/ LEFT URETERAL STENT PLACEMENT  02-25-2001  dr Jeffie Pollock [facility]   NEPHROLITHOTOMY Right 05/14/2016   Procedure: NEPHROLITHOTOMY PERCUTANEOUS;  Surgeon: Franchot Gallo, MD;  Location: [facility];  Service: Urology;  Laterality: Right;   OPEN REDUCTION INTERNAL FIXATION (ORIF) FOOT LISFRANC FRACTURE Right 09/10/2019   Procedure: OPEN REDUCTION INTERNAL FIXATION (ORIF) FOOT LISFRANC FRACTURE;  Surgeon: Erle Crocker, MD;  Location: [facility];  Service: Orthopedics;  Laterality: Right;   ORIF ANKLE FRACTURE Left 09/28/2021   Procedure: OPEN TREATMENT OF LEFT TRIMALLEOLAR FRACTURE WITHOUT POSTERIOR FIXATION, OPEN TREATMENT OF LEFT SYNDESMOSIS DISRUPTION;  Surgeon: Erle Crocker, MD;  Location: [place] [facility];  Service: Orthopedics;  Laterality: Left;   REPAIR EXTENSOR TENDON WITH METATARSAL OSTEOTOMY AND OPEN REDUCTION IN Right 09/10/2019   Procedure: OPEN TREATMENT RIGHT FIRST, SECOND, THIRD TARSOMETATARSAL LISFRANC, MIDFOOT FUSION;  Surgeon: Erle Crocker, MD;  Location: [facility];  Service: Orthopedics;  Laterality: Right;   SHOULDER ARTHROSCOPY Bilateral left 11-13-2000;  right 02-18-2018   TONSILLECTOMY     Family History Family History  Problem Relation Age of Onset   Heart disease Father    Diabetes Father     Social History Social History   Tobacco Use   Smoking status: Never   Smokeless tobacco: Never  Vaping Use   Vaping Use: Never used  Substance Use Topics   Alcohol use: No    Alcohol/week: 0.0 standard drinks of alcohol   Drug use: No   Allergies Compazine, Cymbalta [duloxetine hcl], Methadone, Escitalopram, Macrobid [nitrofurantoin monohyd macro], and Nitrofuran  derivatives  Review of Systems Review of Systems  Constitutional:  Negative for activity change and fever.  HENT:  Negative for facial swelling and trouble swallowing.   Eyes:  Negative for discharge and redness.  Respiratory:  Negative for cough and shortness of breath.   Cardiovascular:  Negative for chest pain and palpitations.  Gastrointestinal:  Negative for abdominal pain and nausea.  Genitourinary:  Negative for dysuria and flank pain.  Musculoskeletal:  Positive for arthralgias. Negative for back pain and gait problem.  Skin:  Negative for pallor and rash.  Neurological:  Positive for weakness and numbness. Negative for syncope and headaches.    Physical Exam Vital Signs  I have reviewed the triage vital signs BP (!) 140/62   Pulse 82   Temp 98.5 F (36.9 C)   Resp 13   SpO2 96%  Physical Exam Vitals and nursing note reviewed.  Constitutional:      General: She is not in acute distress.    Appearance: Normal appearance.  HENT:     Head: Normocephalic and atraumatic.     Right Ear: External ear normal.     Left Ear: External ear normal.     Nose: Nose normal.     Mouth/Throat:     Mouth: Mucous membranes are moist.  Eyes:     General: No scleral icterus.       Right eye: No discharge.        Left eye: No discharge.     Extraocular Movements: Extraocular movements intact.     Pupils: Pupils are equal, round, and reactive to light.  Cardiovascular:     Rate and Rhythm: Normal rate and regular rhythm.     Pulses: Normal pulses.     Heart sounds: Normal heart sounds.  Pulmonary:     Effort: Pulmonary effort is normal. No respiratory distress.     Breath sounds: Normal breath sounds.  Abdominal:     General: Abdomen is flat.  Tenderness: There is no abdominal tenderness.  Musculoskeletal:        General: Tenderness present.     Right lower leg: No edema.     Left lower leg: No edema.       Legs:     Comments: DP pulse intact bilateral   Skin:     General: Skin is warm and dry.     Capillary Refill: Capillary refill takes less than 2 seconds.  Neurological:     Mental Status: She is alert and oriented to person, place, and time.     GCS: GCS eye subscore is 4. GCS verbal subscore is 5. GCS motor subscore is 6.     Cranial Nerves: Cranial nerves 2-12 are intact. No facial asymmetry.     Sensory: Sensory deficit present.     Motor: Weakness present.     Coordination: Coordination is intact.     Comments: Strength 4/5 left upper extremity strength 5/5 right upper extremity Strength 4/5 left lower extremity strength 5/5 right lower extremity Reduced 2 point discrimination left upper and left lower extremity.  Sensation intact right upper and lower extremity Gait not tested 2/2 pt safety   Psychiatric:        Mood and Affect: Mood normal.        Behavior: Behavior normal.     ED Results and Treatments Labs (all labs ordered are listed, but only abnormal results are displayed) Labs Reviewed  COMPREHENSIVE METABOLIC PANEL - Abnormal; Notable for the following components:      Result Value   Glucose, Bld 130 (*)    All other components within normal limits  URINALYSIS, ROUTINE W REFLEX MICROSCOPIC - Abnormal; Notable for the following components:   Color, Urine STRAW (*)    APPearance HAZY (*)    Leukocytes,Ua LARGE (*)    Bacteria, UA RARE (*)    All other components within normal limits  CBC WITH DIFFERENTIAL/PLATELET  CK                                                                                                                          Radiology  CT Hip Left Wo Contrast  Result Date: 12/24/2022 CLINICAL DATA:  Hip trauma, fracture suspected, xray done EXAM: CT OF THE LEFT HIP WITHOUT CONTRAST TECHNIQUE: Multidetector CT imaging of the left hip was performed according to the standard protocol. Multiplanar CT image reconstructions were also generated. RADIATION DOSE REDUCTION: This exam was performed according to the  departmental dose-optimization program which includes automated exposure control, adjustment of the mA and/or kV according to patient size and/or use of iterative reconstruction technique. COMPARISON:  CT 03/14/2018. FINDINGS: Bones/Joint/Cartilage There is no evidence of acute fracture. Alignment is normal. There is mild osteoarthritis of the left hip. Degenerative changes of the left SI joint. Ligaments Suboptimally assessed by CT. Muscles and Tendons No acute myotendinous abnormality by CT. Enthesopathic change of the greater trochanter. Soft tissues No focal fluid collection. Sigmoid diverticulosis.  Subcutaneous calcifications along the left buttocks compatible areas of chronic fat necrosis. Marked distension of the urinary bladder, partially visualized. IMPRESSION: No evidence of acute left hip fracture. Mild osteoarthritis of the left hip. Enthesopathic change of the greater trochanter suggesting chronic distal gluteal tendinopathy. Partially visualized marked distension of the urinary bladder. Electronically Signed   By: Maurine Simmering M.D.   On: 12/24/2022 14:33   CT Lumbar Spine Wo Contrast  Result Date: 12/24/2022 CLINICAL DATA:  Low back pain, increased fracture risk EXAM: CT LUMBAR SPINE WITHOUT CONTRAST TECHNIQUE: Multidetector CT imaging of the lumbar spine was performed without intravenous contrast administration. Multiplanar CT image reconstructions were also generated. RADIATION DOSE REDUCTION: This exam was performed according to the departmental dose-optimization program which includes automated exposure control, adjustment of the mA and/or kV according to patient size and/or use of iterative reconstruction technique. COMPARISON:  CT 03/14/2018. FINDINGS: Segmentation: 5 lumbar type vertebrae. Alignment: Straightening of the lumbar lordosis. No significant listhesis. Vertebrae: There is a nondisplaced fracture at the tip of the left L2 transverse process (series 4, image 35). No vertebral body  compression fracture. Paraspinal and other soft tissues: Aortoiliac atherosclerosis. Atrophic left kidney. Partially visualized right lower pole renal stone. Colonic diverticulosis. Disc levels: Shallow right paracentral disc bulge at L1-L2 results in no significant stenosis. Mild disc bulging at L2-L3 results in mild subarticular narrowing bilaterally. No significant neural foraminal stenosis. Mild disc bulging and ligamentum flavum hypertrophy at L3-L4 results in mild subarticular and neural foraminal narrowing bilaterally. Asymmetric left disc bulging at L4-L5 with superimposed left paracentral disc protrusion, ligament flavum thickening and mild bilateral facet arthropathy results in moderate left subarticular stenosis, moderate left and mild right neural foraminal narrowing. Minimal disc bulging with severe right and moderate left facet arthropathy and ligamentum flavum hypertrophy results in no significant spinal canal stenosis. There is mild bilateral neural foraminal narrowing, left greater than right. IMPRESSION: Nondisplaced fracture at the tip of the left L2 transverse process. No vertebral body compression fracture. Multilevel degenerative changes of the lumbar spine, most prominent at L4-L5 with moderate left subarticular stenosis, moderate left and mild right neural foraminal narrowing. Asymmetric left disc bulging with superimposed left paracentral disc protrusion at this level. Electronically Signed   By: Maurine Simmering M.D.   On: 12/24/2022 14:29   CT ANGIO HEAD NECK W WO CM  Result Date: 12/24/2022 CLINICAL DATA:  Stroke suspected EXAM: CT ANGIOGRAPHY HEAD AND NECK TECHNIQUE: Multidetector CT imaging of the head and neck was performed using the standard protocol during bolus administration of intravenous contrast. Multiplanar CT image reconstructions and MIPs were obtained to evaluate the vascular anatomy. Carotid stenosis measurements (when applicable) are obtained utilizing NASCET criteria,  using the distal internal carotid diameter as the denominator. RADIATION DOSE REDUCTION: This exam was performed according to the departmental dose-optimization program which includes automated exposure control, adjustment of the mA and/or kV according to patient size and/or use of iterative reconstruction technique. CONTRAST:  48mL OMNIPAQUE IOHEXOL 350 MG/ML SOLN COMPARISON:  CT head 06/07/22, CT Angio head/neck 09/04/20 FINDINGS: CT HEAD FINDINGS Brain: No evidence of acute infarction, hemorrhage, hydrocephalus, extra-axial collection or mass lesion/mass effect. Chronic left basal ganglia and right thalamic infarcts. Vascular: No hyperdense vessel or unexpected calcification. Skull: Normal. Negative for fracture or focal lesion. Sinuses/Orbits: No middle ear or mastoid effusion. Paranasal sinuses are clear. Bilateral lens replacement. Orbits are otherwise unremarkable. Other: None. Review of the MIP images confirms the above findings CTA NECK FINDINGS Aortic arch: Two-vessel. Imaged  portion shows no evidence of aneurysm or dissection. No significant stenosis of the major arch vessel origins. Right carotid system: No evidence of dissection, stenosis (50% or greater), or occlusion. Left carotid system: No evidence of dissection, stenosis (50% or greater), or occlusion. Vertebral arteries: Left dominant. There is severe stenosis versus possible occlusion at the origin of the right vertebral artery. No evidence of dissection, stenosis (50% or greater), or occlusion. Skeleton: Negative. Other neck: Negative. Upper chest: Nonspecific biapical ground-glass opacities, possibly due to an underlying infectious or inflammatory process. Review of the MIP images confirms the above findings CTA HEAD FINDINGS Anterior circulation: No significant stenosis, proximal occlusion, aneurysm, or vascular malformation. Posterior circulation: No significant stenosis, proximal occlusion, aneurysm, or vascular malformation. Venous sinuses: As  permitted by contrast timing, patent. Anatomic variants: None Review of the MIP images confirms the above findings IMPRESSION: 1. No acute intracranial abnormality. Chronic left basal ganglia and right thalamic infarcts. 2. No intracranial large vessel occlusion or significant stenosis. 3. Severe stenosis versus possible occlusion at the origin of the right vertebral artery, unchanged from prior. Otherwise, no hemodynamically significant stenosis in the neck. 4. Nonspecific biapical ground-glass opacities, possibly due to an underlying infectious or inflammatory process or due to expiratory phase of imaging. Electronically Signed   By: Marin Roberts M.D.   On: 12/24/2022 14:18   DG Hip Unilat W or Wo Pelvis 2-3 Views Left  Result Date: 12/24/2022 CLINICAL DATA:  Pain left hip EXAM: DG HIP (WITH OR WITHOUT PELVIS) 2-3V LEFT COMPARISON:  None Available. FINDINGS: No fracture or dislocation is seen. There is no significant joint space narrowing. There are multiple soft tissue calcifications. Bony spurs are seen in the pubic symphysis. Scattered arterial calcifications are seen. IMPRESSION: No significant radiographic abnormalities seen in bony structures in left hip. Electronically Signed   By: Elmer Picker M.D.   On: 12/24/2022 11:14    Pertinent labs & imaging results that were available during my care of the patient were reviewed by me and considered in my medical decision making (see MDM for details).  Medications Ordered in ED Medications  HYDROmorphone (DILAUDID) injection 0.5 mg (0.5 mg Intravenous Given 12/24/22 1206)  sodium chloride 0.9 % bolus 1,000 mL (1,000 mLs Intravenous New Bag/Given 12/24/22 1215)  ondansetron (ZOFRAN) injection 4 mg (4 mg Intravenous Given 12/24/22 1206)  iohexol (OMNIPAQUE) 350 MG/ML injection 75 mL (75 mLs Intravenous Contrast Given 12/24/22 1357)                                                                                                                                      Procedures Procedures  (including critical care time)  Medical Decision Making / ED Course    Medical Decision Making:    NATHALI TOTINO is a 72 y.o. female with past medical history as below, significant for chronic pain syndrome, recurrent UTI, HLD, HTN, hypothyroid, seizure disorder, [place] 2021  with left-sided residual deficit/left sided dysesthesias who presents to the ED with complaint of left sided weak/ numbness, left hip pain.. The complaint involves an extensive differential diagnosis and also carries with it a high risk of complications and morbidity.  Serious etiology was considered. Ddx includes but is not limited to: Sprain, strain, soft tissue, fracture, intracranial abnormality, electrolyte derangement, infectious, metabolic, endocrine disturbance, medication effect, etc.  Complete initial physical exam performed, notably the patient  was no acute distress, HDS.  Reduced sensation and strength to left upper and lower extremity.  Pain on palpation to left hip anterior.  Reviewed and confirmed nursing documentation for past medical history, family history, social history.  Vital signs reviewed.    Clinical Course as of 12/24/22 1605  Mon Dec 24, 2022  1032 VAN negative, LKN was around 1130 PM last night. OOW TNK. Will d/w neuro [SG]  1121 Discussed with neurology on-call (Babu), recommend CT/MRI, stroke order set; no code stroke; they will see in consult. [SG]  1125 Hip XR neg, will get ct [SG]    Clinical Course User Index [SG] Jeanell Sparrow, DO   Labs reviewed, these are stable.   Left hip and pelvis is unremarkable.  Will get CT, also get CT of lumbar and CT angio head and neck. CT left hip was unremarkable, CT lumbar with L2 fracture, she has no significant TTP at L2, denies recent falls or injuries.  Unclear etiology MRI brain wo ordered per neurology recommendation.  Patient with left-sided weakness and reduced sensation consistent with her prior CVA but  worsened in severity.  Etiology unclear at this time.  Workup thus far is reassuring.  Patient signed out to incoming EDP pending MRI brain and recheck.   Additional history obtained: -Additional history obtained from family -External records from outside source obtained and reviewed including: Chart review including previous notes, labs, imaging, consultation notes including home medications, prior admission documentation, primary care documentation, prior labs and imaging   Lab Tests: -I ordered, reviewed, and interpreted labs.   The pertinent results include:   Labs Reviewed  COMPREHENSIVE METABOLIC PANEL - Abnormal; Notable for the following components:      Result Value   Glucose, Bld 130 (*)    All other components within normal limits  URINALYSIS, ROUTINE W REFLEX MICROSCOPIC - Abnormal; Notable for the following components:   Color, Urine STRAW (*)    APPearance HAZY (*)    Leukocytes,Ua LARGE (*)    Bacteria, UA RARE (*)    All other components within normal limits  CBC WITH DIFFERENTIAL/PLATELET  CK    Notable for stable  EKG   EKG Interpretation  Date/Time:    Ventricular Rate:    PR Interval:    QRS Duration:   QT Interval:    QTC Calculation:   R Axis:     Text Interpretation:           Imaging Studies ordered: I ordered imaging studies including CTA head/neck, CT lumbar/ CT hip, MRI brain I independently visualized the following imaging with scope of interpretation limited to determining acute life threatening conditions related to emergency care; findings noted above, significant for L2 transverse process fx, chronic degeneration changes to spine I independently visualized and interpreted imaging. I agree with the radiologist interpretation   Medicines ordered and prescription drug management: Meds ordered this encounter  Medications   HYDROmorphone (DILAUDID) injection 0.5 mg   sodium chloride 0.9 % bolus 1,000 mL   ondansetron (ZOFRAN)  injection 4 mg   iohexol (OMNIPAQUE) 350 MG/ML injection 75 mL    -I have reviewed the patients home medicines and have made adjustments as needed   Consultations Obtained: I requested consultation with the neuro,  and discussed lab and imaging findings as well as pertinent plan - they recommend: MRI brain, call back if +tive   Cardiac Monitoring: The patient was maintained on a cardiac monitor.  I personally viewed and interpreted the cardiac monitored which showed an underlying rhythm of: SR  Social Determinants of Health:  Diagnosis or treatment significantly limited by social determinants of health: na   Reevaluation: After the interventions noted above, I reevaluated the patient and found that they have improved  Co morbidities that complicate the patient evaluation  Past Medical History:  Diagnosis Date   Arthritis    Atrophy of left kidney    Cervicalgia    Chronic kidney disease, stage 3 ([place])    acute aki 03-14-2018 in setting pyelonehritis-- resolved   Chronic migraine    Chronic pain syndrome    History of kidney stones    History of pyelonephritis    hx recurrent   History of recurrent UTIs    History of sepsis    07/ 2017 secondary to pyelonephritis   Hypertension    Hypothyroidism    Iron deficiency anemia    Left ureteral stone    Mixed hyperlipidemia    Nephrolithiasis    right side nonobstructive per CT 03-14-2018   Right shoulder pain    post sx 02-18-2018   Seizure disorder ([place]) followed by pcp   per pt dx age 47--  no seizure since 1980's , controlled w/ phenobarbitol   Stroke ([place]) 08/2020   no deficits   Type 2 diabetes mellitus ([place])    followed by pcp   Wears glasses       Dispostion: Disposition decision including need for hospitalization was considered, and patient disposition pending at time of sign out.    Final Clinical Impression(s) / ED Diagnoses Final diagnoses:  Left hip pain  Weakness     This chart was dictated  using voice recognition software.  Despite best efforts to proofread,  errors can occur which can change the documentation meaning.    Jeanell Sparrow, DO 12/24/22 1605

## 2022-12-24 NOTE — ED Triage Notes (Signed)
Pt BIB [facility] c/o left hip and leg pain. Pt had a previous stoke and is week on the left side. BP 140/70, HR 74, RR 16, Spo2 92. Pt is from home with her daughter and she is A & O x 4.

## 2022-12-24 NOTE — Discharge Instructions (Addendum)
It was a pleasure caring for you today in the emergency department. You have a small crack in one of the wings of your lumbar spine.  This does not require surgery.  Please return to the emergency department for any worsening or worrisome symptoms.

## 2022-12-24 NOTE — ED Provider Notes (Signed)
Patient signed to me with Dr. Pearline Cables pending results of her MRI which was negative for acute stroke.  Patient states that she has had pain this morning which is what brought her into the ED.  Is on the left side of her body.  She is not ambulatory.  CT lumbar spine did show a nondisplaced fracture of the tip of L2 transverse process.  She is tender at that area.  Unsure of what caused this.  Will place on pain medication and discharge   Natalie Leigh, MD 12/24/22 1818

## 2023-01-07 ENCOUNTER — Other Ambulatory Visit: Payer: Self-pay | Admitting: Family Medicine

## 2023-01-07 DIAGNOSIS — G4700 Insomnia, unspecified: Secondary | ICD-10-CM

## 2023-01-07 MED ORDER — ZOLPIDEM TARTRATE 5 MG PO TABS: ORAL_TABLET | ORAL | 0 refills | Status: DC

## 2023-01-15 ENCOUNTER — Ambulatory Visit: Payer: Medicare Other | Admitting: Adult Health

## 2023-02-05 ENCOUNTER — Ambulatory Visit (INDEPENDENT_AMBULATORY_CARE_PROVIDER_SITE_OTHER): Payer: Medicare Other | Admitting: Adult Health

## 2023-02-05 ENCOUNTER — Encounter: Payer: Self-pay | Admitting: Adult Health

## 2023-02-05 VITALS — BP 134/58 | HR 77 | Ht <= 58 in | Wt 151.8 lb

## 2023-02-05 DIAGNOSIS — G40909 Epilepsy, unspecified, not intractable, without status epilepticus: Secondary | ICD-10-CM

## 2023-02-05 DIAGNOSIS — R202 Paresthesia of skin: Secondary | ICD-10-CM | POA: Diagnosis not present

## 2023-02-05 DIAGNOSIS — Z8673 Personal history of transient ischemic attack (TIA), and cerebral infarction without residual deficits: Secondary | ICD-10-CM

## 2023-02-05 NOTE — Patient Instructions (Addendum)
Consider Lyrica 25 mg at bedtime Continue phenobarbital

## 2023-02-05 NOTE — Progress Notes (Signed)
PATIENT: Natalie [name] DOB: [birth year]  REASON FOR VISIT: follow up HISTORY FROM: patient PRIMARY NEUROLOGIST: Dr. Demetrios Loll  Chief Complaint  Patient presents with   Follow-up    Pt in 19  Pt here for Migraine/seizure/stroke Pt states unable to work Pt in wheelchair this office visit Pt states numbness and tingling in left leg Pt wants to discuss Phenobarbital Pt states little usage of left hand       HISTORY OF PRESENT ILLNESS: Today 02/05/23  Natalie [name] is a 72 y.o. female who has been followed in this office for stroke, seizures. Returns today for follow-up.  She is here today with her daughter.  She reports No seizures.  Reports increase numbness and tingling in the LUE and LLE. Tried Gabapentin in the past. Reports that it never worked.  She states that the left side of body also itches she has tried over-the-counter hydrocortisone cream with no benefit.  She has never tried Lyrica.   HISTORY Update 07/19/2022 : Patient returns for follow-up after last visit with [name] nurse practitioner 5 months ago.  He was asked to see me as he was seen in the ER on 06/07/2022 for sudden onset of diplopia.  He states that the double vision was binocular and more when she was looking to the left.  Was seen in the ER and was found to have mild eye pain as well.  CT scan of the head was obtained on 06/07/2022 which was negative for acute abnormality.  Patient was subsequently seen on ophthalmologist seems to be helping.  Denies any headaches, slurred speech, loss of vision no focal weakness or gait or balance problems.  He has had no other stroke or TIA symptoms. Phenobarbital for seizures but has not had seizures for more than 23 years.  Willing to consider reducing the dose of her phenobarbital   .for her migraines in the past she has used stadol nasal spray as needed but she nhas ran out of the prescription and her PCP is no longer willing to prescribe it..  Migraine headaches however appear  quite infrequent not more than 2 times a month and over-the-counter medications like Tylenol also seem to work.  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  She is able to ambulate with a walker but states about 4.  She said no falls or injuries.  Also has history of migraines Update 01/29/2022 JM: Patient returns for 39-month stroke follow-up accompanied by her daughter.  Overall stable without new stroke/TIA symptoms.  Reports continued left-sided dysesthesias currently on gabapentin 300 mg 3 times daily. Does have some days where pain is worse. Unable to say if any improvement since starting gabapentin after prior visit but she did unfortunately suffer a fall approx 2 mo after resulting in left trimalleolar ankle fracture and femoral condyle fracture requiring open treatment for trimalleolar fracture on 12/29. She is working with [facility] ortho currently doing PT. Continues to use RW, no additional falls.  Compliant on aspirin and atorvastatin, denies side effects.  Blood pressure today 146/74.  Routinely monitors at home, typically 130s.  She has since establish care with new PCP with completion of lab work today.  No new concerns at this time.         History provided for reference purposes only Update 07/27/2021 JM: returns for 6 month stroke follow up accompanied by her daughter.  Overall stable without new stroke/TIA symptoms.  Reports continued left  sided numbness/tingling. Left sided pain has been interfering with sleep, walking and any type of activity. At prior visit, declined interest in any medication management.  Also right foot numbness which she believes is from prior foot surgery.  She is currently using rolling walker due to worsening right foot pain.  She has had 3 falls since prior visit but thankfully without injury.  Compliant on aspirin and atorvastatin without side effects.  Blood pressure today 156/82.  Routinely monitors at  home and typically stable.  Glucose levels stable per report.  Plans on obtaining lab work with PCP in the next week.  No further concerns at this time.     Update 01/25/2021 JM: Natalie [name] returns for 59-month stroke follow-up accompanied by her daughter and infant great granddaughter   Stable since prior visit without new stroke/TIA symptoms.  Residual left sided numbness/tingling (thigh to foot and hand). Possibly improving but denies worsening.  Was seen by Dr. Wynn Banker 2/15 and placed on low-dose gabapentin but she self discontinued as she did not feel as though this was beneficial.  She declines interest in any further medication management She also continues to have gait instability but also recently underwent right foot hardware removal on 12/27/2020 without complication -she has been slowly recovering from this.   Reports compliance on aspirin 81 mg daily and atorvastatin without associated side effects Blood pressure today 151/91 - monitors at home and typically lower Glucose levels stable at home per patient report She has not had any recent lab   No new concerns at this time   Initial visit 10/26/2020 JM: Natalie [name] is being seen for hospital follow-up unaccompanied. She was discharged home from CIR on 09/15/2020 with recommendation of HH PT. She reports residual left foot numbness and gait impairment. She also reports right foot fracture s/p surgical procedure 08/2020 with planned hardware removal tomorrow but due to recent stroke, procedure delayed.  She believes this is also be contributing to her gait impairment.  Denies new or worsening stroke/TIA symptoms. She remains on atorvastatin 20 mg daily without myalgias. Blood pressure today 158/78 - monitors at home which typically stable . Glucose levels stable at home. Reports repeat A1c with PCP which was at 7.9.  No concerns at this time.   Stroke admission 09/02/2020 Natalie [name] is a 72 y.o. female with history of chronic headaches  chronic migraine, chronic numbness BLE, chronic pain syndrome, CKD, hypertension, hyperlipidemia, type 2 diabetes, and seizures since the age of 34, who presented to [facility] ED on 09/02/2020 with left sided weakness and mild headache. Personally reviewed hospitalization pertinent progress notes, lab work and imaging with summary provided. Evaluated by [name] with stroke work-up revealing acute right corona radiata IPH with mild surrounding edema and trace IVH, etiology likely hypertensive. History of HTN with long-term BP goal normotensive range. History of HLD on atorvastatin 40 mg daily with LDL 69 therefore decrease dosage to 20 mg daily in setting of recent IPH. History of DM with A1c 8.1 on insulin and glipizide. Other stroke risk factors include advanced age, obesity, migraines and prior strokes by imaging. Other active problems include CKD and seizure disorder on phenobarbital. Evaluated by therapies and discharged to CIR on 09/06/2020 for ongoing therapy needs and residual left-sided weakness and balance impairment.     Stroke: acute R corona radiata IPH with mild surrounding edema and trace IVH-etiology likely hypertensive..   CT head -  2 cc acute hematoma in the right corona radiata. There  is early extension into the right lateral ventricle. Background chronic small vessel disease.  CT head repeat - Unchanged appearance of right basal ganglia intraparenchymal hemorrhage.  MRI head - Acute R corona radiata IPH with mild surrounding edema and trace IVH. No evidence of underlying lesion. Few chronic microhemorrhages. Mild to moderate chronic microvascular ischemic changes. Chronic small vessel infarcts CTA H&N -no large vessel stenosis or occlusion no aneurysm or AVM. 2D Echo - EF 60-65%. No source of embolus   [employer] Virus 2 - negative LDL - 69 HgbA1c - 8.1 UDS - (not collected)  VTE prophylaxis - SCDs No antithrombotic prior to admission, now on No antithrombotic ICH Therapy  recommendations:  CIR  Disposition:  CIR  REVIEW OF SYSTEMS: Out of a complete 14 system review of symptoms, the patient complains only of the following symptoms, and all other reviewed systems are negative.  ALLERGIES: Allergies  Allergen Reactions   Compazine Other (See Comments)    Bad headache, vomiting; IV    Cymbalta [Duloxetine Hcl] Other (See Comments)    "Kept me awake"   Methadone Hives   Escitalopram Anxiety   Macrobid [Nitrofurantoin Monohyd Macro] Rash   Nitrofuran Derivatives Rash    HOME MEDICATIONS: Outpatient Medications Prior to Visit  Medication Sig Dispense Refill   amLODipine (NORVASC) 10 MG tablet Take 10 mg by mouth daily.     aspirin EC 81 MG tablet Take 4 tablets (325 mg total) by mouth daily. Swallow whole. 30 tablet 0   atorvastatin (LIPITOR) 80 MG tablet Take 80 mg by mouth daily.     BD INSULIN SYRINGE U/F 31G X 5/16" 0.3 ML MISC Use 1 syringe as directed twice a day     glipiZIDE (GLUCOTROL) 10 MG tablet Take 1 tablet (10 mg total) by mouth daily before breakfast. (Patient taking differently: Take 20 mg by mouth at bedtime. 2 X nightly)     insulin NPH Human (NOVOLIN N) 100 UNIT/ML injection Inject 20 Units into the skin in the morning and at bedtime.     levothyroxine (SYNTHROID) 50 MCG tablet Take 50 mcg by mouth daily before breakfast.     losartan (COZAAR) 25 MG tablet Take 25 mg by mouth daily.     oxyCODONE-acetaminophen (PERCOCET/ROXICET) 5-325 MG tablet Take 1 tablet by mouth every 6 (six) hours as needed for severe pain. 10 tablet 0   OZEMPIC, 0.25 OR 0.5 MG/DOSE, 2 MG/3ML SOPN Inject into the skin.     PHENobarbital (LUMINAL) 97.2 MG tablet TAKE 1 TABLET BY MOUTH ONCE DAILY 90 tablet 3   tiZANidine (ZANAFLEX) 4 MG tablet TAKE 1 TABLET BY MOUTH EVERY 4-6 HOURS AS NEEDED FOR MUSCLE SPASM (Patient taking differently: Take 4 mg by mouth as needed for muscle spasms.) 180 tablet 1   zolpidem (AMBIEN) 5 MG tablet take 1 tablet by mouth at bedtime for  sleep if needed. 30 tablet 0   No facility-administered medications prior to visit.    PAST MEDICAL HISTORY: Past Medical History:  Diagnosis Date   Arthritis    Atrophy of left kidney    Cervicalgia    Chronic kidney disease, stage 3 (HCC)    acute aki 03-14-2018 in setting pyelonehritis-- resolved   Chronic migraine    Chronic pain syndrome    History of kidney stones    History of pyelonephritis    hx recurrent   History of recurrent UTIs    History of sepsis    07/ 2017 secondary to pyelonephritis  Hypertension    Hypothyroidism    Iron deficiency anemia    Left ureteral stone    Mixed hyperlipidemia    Nephrolithiasis    right side nonobstructive per CT 03-14-2018   Right shoulder pain    post sx 02-18-2018   Seizure disorder (HCC) followed by pcp   per pt dx age 55--  no seizure since 1980's , controlled w/ phenobarbitol   Stroke (HCC) 08/2020   no deficits   Type 2 diabetes mellitus (HCC)    followed by pcp   Wears glasses     PAST SURGICAL HISTORY: Past Surgical History:  Procedure Laterality Date   CESAREAN SECTION  1974   CHOLECYSTECTOMY OPEN  2002   CYSTO/  LEFT RETROGRADE PYELOGRAM  06-11-2006   dr Annabell Howells  [facility]   CYSTOSCOPY WITH RETROGRADE PYELOGRAM, URETEROSCOPY AND STENT PLACEMENT Left 04/10/2018   Procedure: CYSTOSCOPY WITH RETROGRADE PYELOGRAM, URETEROSCOPY AND STENT PLACEMENT;  Surgeon: Marcine Matar, MD;  Location: [facility];  Service: Urology;  Laterality: Left;   EYE SURGERY Bilateral    cataract removal   FOOT ARTHRODESIS Right 09/10/2019   Procedure: ARTHRODESIS FOOT;  Surgeon: Terance Hart, MD;  Location: [facility];  Service: Orthopedics;  Laterality: Right;   HARDWARE REMOVAL Right 12/27/2020   Procedure: DEEP ORTHOPEDIC HARDWARE REMOVAL, RIGHT FOOT;  Surgeon: Terance Hart, MD;  Location: [facility];  Service: Orthopedics;  Laterality: Right;   IR URETERAL STENT PLACEMENT  EXISTING ACCESS RIGHT  08-21-2007    dr Vernie Ammons  [facility]   LEFT RETROGRADE PYELOGRAM/ LEFT URETERAL STENT PLACEMENT  02-25-2001   dr Annabell Howells [facility]   NEPHROLITHOTOMY Right 05/14/2016   Procedure: NEPHROLITHOTOMY PERCUTANEOUS;  Surgeon: Marcine Matar, MD;  Location: [facility];  Service: Urology;  Laterality: Right;   OPEN REDUCTION INTERNAL FIXATION (ORIF) FOOT LISFRANC FRACTURE Right 09/10/2019   Procedure: OPEN REDUCTION INTERNAL FIXATION (ORIF) FOOT LISFRANC FRACTURE;  Surgeon: Terance Hart, MD;  Location: [facility];  Service: Orthopedics;  Laterality: Right;   ORIF ANKLE FRACTURE Left 09/28/2021   Procedure: OPEN TREATMENT OF LEFT TRIMALLEOLAR FRACTURE WITHOUT POSTERIOR FIXATION, OPEN TREATMENT OF LEFT SYNDESMOSIS DISRUPTION;  Surgeon: Terance Hart, MD;  Location[facility];  Service: Orthopedics;  Laterality: Left;   REPAIR EXTENSOR TENDON WITH METATARSAL OSTEOTOMY AND OPEN REDUCTION IN Right 09/10/2019   Procedure: OPEN TREATMENT RIGHT FIRST, SECOND, THIRD TARSOMETATARSAL LISFRANC, MIDFOOT FUSION;  Surgeon: Terance Hart, MD;  Location: [facility];  Service: Orthopedics;  Laterality: Right;   SHOULDER ARTHROSCOPY Bilateral left 11-13-2000;  right 02-18-2018   TONSILLECTOMY      FAMILY HISTORY: Family History  Problem Relation Age of Onset   Heart disease Father    Diabetes Father    Migraines Maternal Grandmother    Seizures Maternal Grandmother     SOCIAL HISTORY: Social History   Socioeconomic History   Marital status: Married    Spouse name: Not on file   Number of children: 2   Years of education: 2 years of college   Highest education level: Not on file  Occupational History   Not on file  Tobacco Use   Smoking status: Never   Smokeless tobacco: Never  Vaping Use   Vaping Use: Never used  Substance and Sexual Activity   Alcohol use: No    Alcohol/week: 0.0 standard drinks of alcohol   Drug use: No   Sexual activity: Not  on file  Other Topics Concern  Not on file  Social History Narrative   ** Merged History Encounter **       Social Determinants of Health   Financial Resource Strain: Not on file  Food Insecurity: Not on file  Transportation Needs: Not on file  Physical Activity: Not on file  Stress: Not on file  Social Connections: Not on file  Intimate Partner Violence: Not on file      PHYSICAL EXAM  Vitals:   02/05/23 1434  BP: (!) 134/58  Pulse: 77  Weight: 151 lb 12.8 oz (68.9 kg)  Height: 4\' 8"  (1.422 m)   Body mass index is 34.03 kg/m.  Generalized: Well developed, in no acute distress   Neurological examination  Mentation: Alert oriented to time, place, history taking. Follows all commands speech and language fluent Cranial nerve II-XII: Pupils were equal round reactive to light. Extraocular movements were full, visual field were full on confrontational test. Facial sensation and strength were normal. Uvula tongue midline. Head turning and shoulder shrug  were normal and symmetric. Motor: The motor testing reveals 5 over 5 strength of all 4 extremities. Good symmetric motor tone is noted throughout.  Muscle spasticity noted in the left upper extremity Sensory: Sensory testing is intact to soft touch on all 4 extremities. No evidence of extinction is noted.  Coordination: Cerebellar testing reveals good finger-nose-finger and heel-to-shin bilaterally.  Gait and station: In a wheelchair  DIAGNOSTIC DATA (LABS, IMAGING, TESTING) - I reviewed patient records, labs, notes, testing and imaging myself where available.  Lab Results  Component Value Date   WBC 7.6 12/24/2022   HGB 13.0 12/24/2022   HCT 40.3 12/24/2022   MCV 96.4 12/24/2022   PLT 253 12/24/2022      Component Value Date/Time   NA 139 12/24/2022 1043   NA 141 12/01/2018 1653   K 3.9 12/24/2022 1043   CL 101 12/24/2022 1043   CO2 24 12/24/2022 1043   GLUCOSE 130 (H) 12/24/2022 1043   BUN 17 12/24/2022 1043    BUN 21 12/01/2018 1653   CREATININE 0.93 12/24/2022 1043   CREATININE 1.13 (H) 07/12/2016 1146   CALCIUM 10.2 12/24/2022 1043   PROT 7.3 12/24/2022 1043   PROT 6.2 12/01/2018 1653   ALBUMIN 3.7 12/24/2022 1043   ALBUMIN 4.2 12/01/2018 1653   AST 29 12/24/2022 1043   ALT 29 12/24/2022 1043   ALKPHOS 96 12/24/2022 1043   BILITOT 0.5 12/24/2022 1043   BILITOT <0.2 12/01/2018 1653   GFRNONAA >60 12/24/2022 1043   GFRNONAA 37 (L) 06/21/2016 1209   GFRAA 46 (L) 09/07/2019 1000   GFRAA 43 (L) 06/21/2016 1209   Lab Results  Component Value Date   CHOL 190 01/25/2021   HDL 70 01/25/2021   LDLCALC 97 01/25/2021   LDLDIRECT 107 (H) 05/05/2018   TRIG 132 01/25/2021   CHOLHDL 2.7 01/25/2021   Lab Results  Component Value Date   HGBA1C 10.3 (H) 09/28/2021   Lab Results  Component Value Date   VITAMINB12 >2000 (H) 12/27/2016   Lab Results  Component Value Date   TSH 1.258 03/13/2018      ASSESSMENT AND PLAN 72 y.o. year old female  has a past medical history of Arthritis, Atrophy of left kidney, Cervicalgia, Chronic kidney disease, stage 3 (HCC), Chronic migraine, Chronic pain syndrome, History of kidney stones, History of pyelonephritis, History of recurrent UTIs, History of sepsis, Hypertension, Hypothyroidism, Iron deficiency anemia, Left ureteral stone, Mixed hyperlipidemia, Nephrolithiasis, Right shoulder pain, Seizure disorder (HCC) (followed  by pcp), Stroke [facility]) (08/2020), Type 2 diabetes mellitus (HCC), and Wears glasses. here with:  1.  History of stroke 2.  Seizures 3.  Paresthesias  Continue phenobarbital Will check phenobarbital level today Discussed paresthesias- mentioned trying low-dose Lyrica 25 mg at bedtime.  I provided her with information regarding this drug.  She will look over and let us know if she wants to proceed. Follow-up in 6 months with Tamsen Snider, MSN, NP-C 02/05/2023, 2:40 PM [facility] [address],  [address] [place], [place] [address] [phone]

## 2023-02-06 LAB — PHENOBARBITAL LEVEL: Phenobarbital, Serum: 26 ug/mL (ref 15–40)

## 2023-02-07 ENCOUNTER — Ambulatory Visit (INDEPENDENT_AMBULATORY_CARE_PROVIDER_SITE_OTHER): Payer: Medicare Other | Admitting: Nurse Practitioner

## 2023-02-07 ENCOUNTER — Encounter: Payer: Self-pay | Admitting: Nurse Practitioner

## 2023-02-07 VITALS — BP 118/68 | HR 76 | Temp 98.2°F | Resp 16 | Ht <= 58 in | Wt 151.0 lb

## 2023-02-07 DIAGNOSIS — G40909 Epilepsy, unspecified, not intractable, without status epilepticus: Secondary | ICD-10-CM

## 2023-02-07 DIAGNOSIS — I10 Essential (primary) hypertension: Secondary | ICD-10-CM

## 2023-02-07 DIAGNOSIS — L299 Pruritus, unspecified: Secondary | ICD-10-CM | POA: Diagnosis not present

## 2023-02-07 DIAGNOSIS — Z8673 Personal history of transient ischemic attack (TIA), and cerebral infarction without residual deficits: Secondary | ICD-10-CM | POA: Diagnosis not present

## 2023-02-07 DIAGNOSIS — Z7984 Long term (current) use of oral hypoglycemic drugs: Secondary | ICD-10-CM

## 2023-02-07 DIAGNOSIS — Z7985 Long-term (current) use of injectable non-insulin antidiabetic drugs: Secondary | ICD-10-CM

## 2023-02-07 DIAGNOSIS — E1122 Type 2 diabetes mellitus with diabetic chronic kidney disease: Secondary | ICD-10-CM

## 2023-02-07 DIAGNOSIS — G43711 Chronic migraine without aura, intractable, with status migrainosus: Secondary | ICD-10-CM

## 2023-02-07 DIAGNOSIS — Z794 Long term (current) use of insulin: Secondary | ICD-10-CM

## 2023-02-07 DIAGNOSIS — E039 Hypothyroidism, unspecified: Secondary | ICD-10-CM

## 2023-02-07 LAB — POCT GLYCOSYLATED HEMOGLOBIN (HGB A1C): Hemoglobin A1C: 7.1 % — AB (ref 4.0–5.6)

## 2023-02-07 MED ORDER — CETIRIZINE HCL 10 MG PO TABS: 10.0000 mg | ORAL_TABLET | Freq: Every day | ORAL | 11 refills | Status: DC

## 2023-02-07 MED ORDER — AMLODIPINE BESYLATE 5 MG PO TABS: 5.0000 mg | ORAL_TABLET | Freq: Every day | ORAL | 1 refills | Status: DC

## 2023-02-07 MED ORDER — GLIPIZIDE 10 MG PO TABS: 10.0000 mg | ORAL_TABLET | Freq: Two times a day (BID) | ORAL | Status: DC

## 2023-02-07 NOTE — Assessment & Plan Note (Signed)
Patient is followed by [facility].  On phenobarbital.  Sees them approximate every 6 months given approximately 30 years since her last seizure.

## 2023-02-07 NOTE — Assessment & Plan Note (Signed)
History of the same.  She is followed by neurology.  States she can take an Excedrin migraine pill given to her by

## 2023-02-07 NOTE — Progress Notes (Signed)
New Patient Office Visit  Subjective    Patient ID: Natalie [name], female    DOB: 07-18-1951  Age: 72 y.o. MRN: [id number]  CC:  Chief Complaint  Patient presents with   Establish Care    HPI Natalie [name] presents to establish care  DM2: states that she does not check her sugars at home. Does have the equipment Gliziped 40 Insuin 20 units twice Ozempic 0.5mg  weekly   Migraines: states that she gets them once a week.States hix of aura.  Right sided. States that she does have light sensitivity. States that she will take excedrin that will help  HTN: amlodipine 5mg  and losartan 25mg . States that she does not check her blood pressure at home but does have a cuff  Hypothyroidism: levo thryoxine is  Seizures: States that it has been 30 years since she has a seizures    CVA; states that she had a stroke in 2021 and does have left sided  weakness  Colonoscopy: 06/26/2021 no repeat  Left leg: no feeling since the stroke and it itches. States that she did try benadryl that did not help a lot. The itch is always in the same spot   Mammogram: does not want to continue  DEXA: 2006.    Outpatient Encounter Medications as of 02/07/2023  Medication Sig   amLODipine (NORVASC) 5 MG tablet Take 1 tablet (5 mg total) by mouth daily.   aspirin EC 81 MG tablet Take 4 tablets (325 mg total) by mouth daily. Swallow whole.   atorvastatin (LIPITOR) 80 MG tablet Take 80 mg by mouth daily.   BD INSULIN SYRINGE U/F 31G X 5/16" 0.3 ML MISC Use 1 syringe as directed twice a day   cetirizine (ZYRTEC) 10 MG tablet Take 1 tablet (10 mg total) by mouth daily.   insulin NPH Human (NOVOLIN N) 100 UNIT/ML injection Inject 20 Units into the skin in the morning and at bedtime.   levothyroxine (SYNTHROID) 50 MCG tablet Take 50 mcg by mouth daily before breakfast.   losartan (COZAAR) 25 MG tablet Take 25 mg by mouth daily.   OZEMPIC, 0.25 OR 0.5 MG/DOSE, 2 MG/3ML SOPN Inject into the skin.    PHENobarbital (LUMINAL) 97.2 MG tablet TAKE 1 TABLET BY MOUTH ONCE DAILY   tiZANidine (ZANAFLEX) 4 MG tablet TAKE 1 TABLET BY MOUTH EVERY 4-6 HOURS AS NEEDED FOR MUSCLE SPASM (Patient taking differently: Take 4 mg by mouth as needed for muscle spasms.)   zolpidem (AMBIEN) 5 MG tablet take 1 tablet by mouth at bedtime for sleep if needed.   [DISCONTINUED] amLODipine (NORVASC) 10 MG tablet Take 10 mg by mouth daily.   [DISCONTINUED] glipiZIDE (GLUCOTROL) 10 MG tablet Take 1 tablet (10 mg total) by mouth daily before breakfast. (Patient taking differently: Take 20 mg by mouth at bedtime. 2 X nightly)   glipiZIDE (GLUCOTROL) 10 MG tablet Take 1 tablet (10 mg total) by mouth 2 (two) times daily before a meal.   [DISCONTINUED] oxyCODONE-acetaminophen (PERCOCET/ROXICET) 5-325 MG tablet Take 1 tablet by mouth every 6 (six) hours as needed for severe pain. (Patient not taking: Reported on 02/07/2023)   No facility-administered encounter medications on file as of 02/07/2023.    Past Medical History:  Diagnosis Date   Arthritis    Atrophy of left kidney    Cervicalgia    Chronic kidney disease, stage 3 (HCC)    acute aki 03-14-2018 in setting pyelonehritis-- resolved   Chronic migraine    Chronic  pain syndrome    History of kidney stones    History of pyelonephritis    hx recurrent   History of recurrent UTIs    History of sepsis    07/ 2017 secondary to pyelonephritis   Hypertension    Hypothyroidism    Iron deficiency anemia    Left ureteral stone    Mixed hyperlipidemia    Nephrolithiasis    right side nonobstructive per CT 03-14-2018   Right shoulder pain    post sx 02-18-2018   Seizure disorder (HCC) followed by pcp   per pt dx age 63--  no seizure since 1980's , controlled w/ phenobarbitol   Stroke (HCC) 08/2020   no deficits   Type 2 diabetes mellitus (HCC)    followed by pcp   Wears glasses     Past Surgical History:  Procedure Laterality Date   CESAREAN SECTION  1974    CHOLECYSTECTOMY OPEN  2002   CYSTO/  LEFT RETROGRADE PYELOGRAM  06-11-2006   dr Annabell Howells  [facility]   CYSTOSCOPY WITH RETROGRADE PYELOGRAM, URETEROSCOPY AND STENT PLACEMENT Left 04/10/2018   Procedure: CYSTOSCOPY WITH RETROGRADE PYELOGRAM, URETEROSCOPY AND STENT PLACEMENT;  Surgeon: Marcine Matar, MD;  Location: [facility];  Service: Urology;  Laterality: Left;   EYE SURGERY Bilateral    cataract removal   FOOT ARTHRODESIS Right 09/10/2019   Procedure: ARTHRODESIS FOOT;  Surgeon: Terance Hart, MD;  Location: [facility];  Service: Orthopedics;  Laterality: Right;   HARDWARE REMOVAL Right 12/27/2020   Procedure: DEEP ORTHOPEDIC HARDWARE REMOVAL, RIGHT FOOT;  Surgeon: Terance Hart, MD;  Location: [facility];  Service: Orthopedics;  Laterality: Right;   IR URETERAL STENT PLACEMENT EXISTING ACCESS RIGHT  08-21-2007    dr Vernie Ammons  [facility]   LEFT RETROGRADE PYELOGRAM/ LEFT URETERAL STENT PLACEMENT  02-25-2001   dr Annabell Howells [facility]   NEPHROLITHOTOMY Right 05/14/2016   Procedure: NEPHROLITHOTOMY PERCUTANEOUS;  Surgeon: Marcine Matar, MD;  Location: [facility];  Service: Urology;  Laterality: Right;   OPEN REDUCTION INTERNAL FIXATION (ORIF) FOOT LISFRANC FRACTURE Right 09/10/2019   Procedure: OPEN REDUCTION INTERNAL FIXATION (ORIF) FOOT LISFRANC FRACTURE;  Surgeon: Terance Hart, MD;  Location: [facility];  Service: Orthopedics;  Laterality: Right;   ORIF ANKLE FRACTURE Left 09/28/2021   Procedure: OPEN TREATMENT OF LEFT TRIMALLEOLAR FRACTURE WITHOUT POSTERIOR FIXATION, OPEN TREATMENT OF LEFT SYNDESMOSIS DISRUPTION;  Surgeon: Terance Hart, MD;  Location: [facility];  Service: Orthopedics;  Laterality: Left;   REPAIR EXTENSOR TENDON WITH METATARSAL OSTEOTOMY AND OPEN REDUCTION IN Right 09/10/2019   Procedure: OPEN TREATMENT RIGHT FIRST, SECOND, THIRD TARSOMETATARSAL LISFRANC, MIDFOOT FUSION;  Surgeon: Terance Hart, MD;   Location: [facility];  Service: Orthopedics;  Laterality: Right;   SHOULDER ARTHROSCOPY Bilateral left 11-13-2000;  right 02-18-2018   TONSILLECTOMY      Family History  Problem Relation Age of Onset   Heart disease Father    Diabetes Father    Migraines Maternal Grandmother    Seizures Maternal Grandmother     Social History   Socioeconomic History   Marital status: Married    Spouse name: david   Number of children: 2   Years of education: 2 years of college   Highest education level: Not on file  Occupational History   Not on file  Tobacco Use   Smoking status: Never   Smokeless tobacco: Never  Vaping Use   Vaping Use: Never used  Substance  and Sexual Activity   Alcohol use: No    Alcohol/week: 0.0 standard drinks of alcohol   Drug use: No   Sexual activity: Not Currently    Birth control/protection: Post-menopausal  Other Topics Concern   Not on file  Social History Narrative   Was a home maker       Armond Hang    Social Determinants of Health   Financial Resource Strain: Not on file  Food Insecurity: Not on file  Transportation Needs: Not on file  Physical Activity: Not on file  Stress: Not on file  Social Connections: Not on file  Intimate Partner Violence: Not on file    Review of Systems  Constitutional:  Negative for chills and fever.  Respiratory:  Negative for shortness of breath.   Cardiovascular:  Negative for chest pain.  Gastrointestinal:  Negative for abdominal pain, constipation, diarrhea, nausea and vomiting.  Neurological:  Positive for tingling and focal weakness. Negative for headaches.  Psychiatric/Behavioral:  Negative for hallucinations and suicidal ideas.         Objective    BP 118/68   Pulse 76   Temp 98.2 F (36.8 C)   Resp 16   Ht 4\' 8"  (1.422 m)   Wt 151 lb (68.5 kg)   SpO2 95%   BMI 33.85 kg/m   Physical Exam Vitals and nursing note reviewed.  Constitutional:      Appearance: Normal  appearance.  HENT:     Mouth/Throat:     Mouth: Mucous membranes are moist.     Pharynx: Oropharynx is clear.  Eyes:     Extraocular Movements: Extraocular movements intact.     Pupils: Pupils are equal, round, and reactive to light.  Cardiovascular:     Rate and Rhythm: Normal rate and regular rhythm.     Pulses: Normal pulses.          Dorsalis pedis pulses are 2+ on the right side and 2+ on the left side.       Posterior tibial pulses are 2+ on the right side and 2+ on the left side.     Heart sounds: Normal heart sounds.  Pulmonary:     Effort: Pulmonary effort is normal.     Breath sounds: Normal breath sounds.  Musculoskeletal:     Right lower leg: No edema.     Left lower leg: Edema present.  Feet:     Right foot:     Skin integrity: Skin integrity normal.     Left foot:     Skin integrity: Skin integrity normal.  Lymphadenopathy:     Cervical: No cervical adenopathy.  Neurological:     Mental Status: She is alert. Mental status is at baseline.     Deep Tendon Reflexes:     Reflex Scores:      Bicep reflexes are 2+ on the right side and 2+ on the left side.      Patellar reflexes are 2+ on the right side and 2+ on the left side.    Comments: 4/5 LUE and LLE  5/5 RUE and RLE         Assessment & Plan:   Problem List Items Addressed This Visit       Cardiovascular and Mediastinum   HTN (hypertension) - Primary    Patient currently maintained on losartan 25 mg and amlodipine 5 mg.  Refill for amlodipine 5 mg in today.  Blood pressure within normal limits continue both medications  Relevant Medications   amLODipine (NORVASC) 5 MG tablet   Migraine, chronic, without aura, intractable, with status migrainosus    History of the same.  She is followed by neurology.  States she can take an Excedrin migraine pill given to her by      Relevant Medications   amLODipine (NORVASC) 5 MG tablet     Endocrine   Diabetes mellitus, type II (HCC)    Patient  currently maintained on NPH insulin 20 units twice daily along with semaglutide 0.25 mg once weekly and glipizide 20 mg twice daily.  I did change glipizide to 10 mg twice daily we will continue the other medications.  A1c 7.1% today.  Follow-up in 3 months for recheck did encourage patient to at least check her sugar daily      Relevant Medications   glipiZIDE (GLUCOTROL) 10 MG tablet   Other Relevant Orders   POCT glycosylated hemoglobin (Hb A1C) (Completed)   Hypothyroidism     Nervous and Auditory   Seizure disorder [facility])    Patient is followed by [facility].  On phenobarbital.  Sees them approximate every 6 months given approximately 30 years since her last seizure.        Musculoskeletal and Integument   Pruritus    Patient having pruritus on her left lower extremity feels like it is deep as she cannot reach it.  She is taken Benadryl unsure if it was beneficial.  Did discuss using a second-generation antihistamine, prescription sent in.  Also use nerve distraction technique with a soft partial brush when it happens trying to stimulate a different part of the leg to see if that will clear the sensation      Relevant Medications   cetirizine (ZYRTEC) 10 MG tablet     Other   History of cerebrovascular accident (CVA) in adulthood    History of a CVA with left-sided sequela.  Slight weakness.  Patient is in a wheelchair most of the time secondary to pain in her feet.  Patient is on a high intensity statin.       Return in about 3 months (around 05/10/2023) for DM recheck.   Natalie Nine, NP

## 2023-02-07 NOTE — Assessment & Plan Note (Signed)
Patient having pruritus on her left lower extremity feels like it is deep as she cannot reach it.  She is taken Benadryl unsure if it was beneficial.  Did discuss using a second-generation antihistamine, prescription sent in.  Also use nerve distraction technique with a soft partial brush when it happens trying to stimulate a different part of the leg to see if that will clear the sensation

## 2023-02-07 NOTE — Assessment & Plan Note (Signed)
Patient currently maintained on losartan 25 mg and amlodipine 5 mg.  Refill for amlodipine 5 mg in today.  Blood pressure within normal limits continue both medications

## 2023-02-07 NOTE — Assessment & Plan Note (Signed)
Patient currently maintained on NPH insulin 20 units twice daily along with semaglutide 0.25 mg once weekly and glipizide 20 mg twice daily.  I did change glipizide to 10 mg twice daily we will continue the other medications.  A1c 7.1% today.  Follow-up in 3 months for recheck did encourage patient to at least check her sugar daily

## 2023-02-07 NOTE — Assessment & Plan Note (Signed)
History of a CVA with left-sided sequela.  Slight weakness.  Patient is in a wheelchair most of the time secondary to pain in her feet.  Patient is on a high intensity statin.

## 2023-02-07 NOTE — Patient Instructions (Signed)
Nice to see you today I have changed glipizide to 10mg  tablet twice a day I have sent in the amlodipine 5mg  tablets so you do not have to cut them in half Follow up with me in 3 months, sooner If you need me  YOur A1c was 7.1 percent today  I would like for you to at least check your sugar daily

## 2023-02-12 ENCOUNTER — Other Ambulatory Visit: Payer: Self-pay | Admitting: Family Medicine

## 2023-02-12 ENCOUNTER — Other Ambulatory Visit: Payer: Self-pay | Admitting: Neurology

## 2023-02-12 ENCOUNTER — Other Ambulatory Visit: Payer: Self-pay | Admitting: Nurse Practitioner

## 2023-02-12 DIAGNOSIS — E1122 Type 2 diabetes mellitus with diabetic chronic kidney disease: Secondary | ICD-10-CM

## 2023-02-12 DIAGNOSIS — G4700 Insomnia, unspecified: Secondary | ICD-10-CM

## 2023-02-12 NOTE — Telephone Encounter (Signed)
Requested Prescriptions   Pending Prescriptions Disp Refills   PHENobarbital (LUMINAL) 97.2 MG tablet [Pharmacy Med Name: PHENOBARBITAL 97.2 MG TABLET] 90 tablet     Sig: TAKE 1 TABLET BY MOUTH EVERY DAY   Last seen 02/05/23, next appt scheduled 08/12/23 Routing to provider to fill Dispenses   Dispensed Days Supply Quantity Provider Pharmacy  PHENOBARBITAL 97.2 MG TABLET 11/18/2022 90 90 each Micki Riley, MD [facility] [phone] - [facility]...  PHENOBARBITAL 97.2 MG TABLET 08/20/2022 90 90 each Micki Riley, MD [facility] [phone] - [facility]...  PHENOBARBITAL 97.2 MG TABLET 07/11/2022 35 50 each Lula Olszewski, MD [facility] [phone] - [facility]...  phenobarbital 97.2 mg tablet 07/11/2022 30  Lula Olszewski, MD [facility] [phone] - [facility]...  PHENOBARBITAL 97.2 MG TABLET 05/22/2022 35 50 each Lula Olszewski, MD [facility] [phone] - W...  phenobarbital 97.2 mg tablet 05/22/2022 30  Lula Olszewski, MD [facility] [phone] - [facility]...  PHENOBARBITAL 97.2 MG TABLET 04/09/2022 35 50 each Elder Love, DO [facility] [phone] - W...  phenobarbital 97.2 mg tablet 04/09/2022 30  [place], [place], [place] [facility] [phone] - [facility]...  PHENOBARBITAL 97.2 MG TABLET 02/22/2022 30 50 each Leone Haven, MD [facility] [phone] - [address]

## 2023-02-14 ENCOUNTER — Telehealth: Payer: Self-pay | Admitting: Nurse Practitioner

## 2023-02-14 ENCOUNTER — Other Ambulatory Visit: Payer: Self-pay | Admitting: Nurse Practitioner

## 2023-02-14 DIAGNOSIS — G4700 Insomnia, unspecified: Secondary | ICD-10-CM

## 2023-02-14 DIAGNOSIS — E1122 Type 2 diabetes mellitus with diabetic chronic kidney disease: Secondary | ICD-10-CM

## 2023-02-14 MED ORDER — ZOLPIDEM TARTRATE 5 MG PO TABS: ORAL_TABLET | ORAL | 0 refills | Status: DC

## 2023-02-14 MED ORDER — GLIPIZIDE 10 MG PO TABS: 10.0000 mg | ORAL_TABLET | Freq: Two times a day (BID) | ORAL | 1 refills | Status: DC

## 2023-02-14 NOTE — Telephone Encounter (Signed)
Called pt and he stated that she filled by Shawnie Pons when she was in between dr.

## 2023-02-14 NOTE — Telephone Encounter (Signed)
  Prescription Request  02/14/2023  LOV: 02/07/2023  What is the name of the medication or equipment? zolpidem (AMBIEN) 5 MG tablet    Have you contacted your pharmacy to request a refill? No   Which pharmacy would you like this sent to?  [id number], [place] - [address] [place] [address] Phone: [phone] Fax: [phone]    Patient notified that their request is being sent to the clinical staff for review and that they should receive a response within 2 business days.   Please advise at Mobile [phone] (mobile)    Patient called in and stated that she would like to have a referral for a podiatrist to be sent in for her.

## 2023-02-14 NOTE — Telephone Encounter (Signed)
Refill sent to pharmacy.   

## 2023-02-14 NOTE — Addendum Note (Signed)
Addended by: Eden Emms on: 02/14/2023 12:23 PM   Modules accepted: Orders

## 2023-02-14 NOTE — Telephone Encounter (Signed)
This medication is filled by Dr. Tinnie Gens. She needs to contact her office

## 2023-02-21 ENCOUNTER — Telehealth: Payer: Self-pay

## 2023-02-21 DIAGNOSIS — L609 Nail disorder, unspecified: Secondary | ICD-10-CM

## 2023-02-21 NOTE — Telephone Encounter (Signed)
Patient Advocate Encounter   Received notification from [facility] that prior authorization for Zolpidem is required.   PA submitted on 02/21/2023 Key [id number] Status is pending

## 2023-02-22 NOTE — Telephone Encounter (Signed)
I do not remember that conversation was it for toenails or something else. Also Natalie [name] or Natalie [name]

## 2023-02-22 NOTE — Telephone Encounter (Signed)
Called pt  and she stated Ramireno would be fine.

## 2023-02-22 NOTE — Telephone Encounter (Signed)
Pharmacy Patient Advocate Encounter  Prior Authorization for Zolpidem has been approved by [facility] (ins).    PA # [id number] Effective dates: 02/21/2023 until Further notice

## 2023-02-22 NOTE — Telephone Encounter (Signed)
Called and informed pt of this information. She also stated that you was going to give her a name of a podiatric but I do not see a referral or anything in the chart.

## 2023-02-26 NOTE — Addendum Note (Signed)
Addended by: Eden Emms on: 02/26/2023 01:38 PM   Modules accepted: Orders

## 2023-02-27 ENCOUNTER — Other Ambulatory Visit: Payer: Self-pay

## 2023-02-27 NOTE — Telephone Encounter (Signed)
insulin NPH Human (NOVOLIN N) 100 UNIT/ML injection LOV 02/07/2023 NOV 05/10/2023

## 2023-02-28 MED ORDER — INSULIN NPH (HUMAN) (ISOPHANE) 100 UNIT/ML ~~LOC~~ SUSP: 20.0000 [IU] | Freq: Two times a day (BID) | SUBCUTANEOUS | 1 refills | Status: DC

## 2023-03-14 ENCOUNTER — Other Ambulatory Visit: Payer: Self-pay | Admitting: Nurse Practitioner

## 2023-03-14 DIAGNOSIS — G4700 Insomnia, unspecified: Secondary | ICD-10-CM

## 2023-03-15 ENCOUNTER — Ambulatory Visit (INDEPENDENT_AMBULATORY_CARE_PROVIDER_SITE_OTHER): Payer: Medicare Other | Admitting: Podiatry

## 2023-03-15 ENCOUNTER — Encounter: Payer: Self-pay | Admitting: Podiatry

## 2023-03-15 VITALS — BP 168/68

## 2023-03-15 DIAGNOSIS — I69354 Hemiplegia and hemiparesis following cerebral infarction affecting left non-dominant side: Secondary | ICD-10-CM | POA: Diagnosis not present

## 2023-03-15 DIAGNOSIS — M79674 Pain in right toe(s): Secondary | ICD-10-CM | POA: Diagnosis not present

## 2023-03-15 DIAGNOSIS — M2142 Flat foot [pes planus] (acquired), left foot: Secondary | ICD-10-CM

## 2023-03-15 DIAGNOSIS — M79675 Pain in left toe(s): Secondary | ICD-10-CM

## 2023-03-15 DIAGNOSIS — E1142 Type 2 diabetes mellitus with diabetic polyneuropathy: Secondary | ICD-10-CM | POA: Diagnosis not present

## 2023-03-15 DIAGNOSIS — M2141 Flat foot [pes planus] (acquired), right foot: Secondary | ICD-10-CM | POA: Diagnosis not present

## 2023-03-15 DIAGNOSIS — B351 Tinea unguium: Secondary | ICD-10-CM | POA: Diagnosis not present

## 2023-03-15 DIAGNOSIS — E119 Type 2 diabetes mellitus without complications: Secondary | ICD-10-CM

## 2023-03-18 NOTE — Progress Notes (Signed)
Subjective: Natalie [name] presents today for diabetic foot evaluation.  Patient relates 10 year h/o diabetes.  Patient denies any h/o foot wounds.  Patient has been diagnosed with neuropathy.  She has h/o CVA with left sided weakness.  Risk factors: diabetes, diabetic neuropathy, h/o CVA, HTN, CKD, hyperlipidemia, hyperlipemia.  PCP is Eden Emms, NP.  Past Medical History:  Diagnosis Date   Arthritis    Atrophy of left kidney    Cervicalgia    Chronic kidney disease, stage 3 (HCC)    acute aki 03-14-2018 in setting pyelonehritis-- resolved   Chronic migraine    Chronic pain syndrome    History of kidney stones    History of pyelonephritis    hx recurrent   History of recurrent UTIs    History of sepsis    07/ 2017 secondary to pyelonephritis   Hypertension    Hypothyroidism    Iron deficiency anemia    Left ureteral stone    Mixed hyperlipidemia    Nephrolithiasis    right side nonobstructive per CT 03-14-2018   Right shoulder pain    post sx 02-18-2018   Seizure disorder (HCC) followed by pcp   per pt dx age 29--  no seizure since 1980's , controlled w/ phenobarbitol   Stroke (HCC) 08/2020   no deficits   Type 2 diabetes mellitus (HCC)    followed by pcp   Wears glasses     Patient Active Problem List   Diagnosis Date Noted   History of cerebrovascular accident (CVA) in adulthood 02/07/2023   Pruritus 02/07/2023   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 09/02/2020   Chronic pain syndrome 03/16/2018   Colon cancer screening 03/16/2018   Complicated UTI (urinary tract infection) 03/14/2018   Acute renal failure superimposed on stage 3 chronic kidney disease (HCC) 03/14/2018   Hypotension 03/14/2018   Generalized weakness    Chronic kidney disease 09/06/2016   Anemia, iron deficiency 06/23/2016   Vitamin B12 deficiency (dietary) anemia 06/23/2016   Migraine, chronic, without aura, intractable, with status migrainosus  01/04/2016   Abdominal pain    Pyelonephritis 08/31/2014   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   CYSTOSCOPY WITH RETROGRADE PYELOGRAM, URETEROSCOPY AND STENT PLACEMENT Left 04/10/2018   Procedure: CYSTOSCOPY WITH RETROGRADE PYELOGRAM, URETEROSCOPY AND STENT PLACEMENT;  Surgeon: Marcine Matar, MD;  Location: [facility];  Service: Urology;  Laterality: Left;   EYE SURGERY Bilateral    cataract removal   FOOT ARTHRODESIS Right 09/10/2019   Procedure: ARTHRODESIS FOOT;  Surgeon: Terance Hart, MD;  Location: [facility];  Service: Orthopedics;  Laterality: Right;   HARDWARE REMOVAL Right 12/27/2020   Procedure: DEEP ORTHOPEDIC HARDWARE REMOVAL, RIGHT FOOT;  Surgeon: Terance Hart, MD;  Location: [facility];  Service: Orthopedics;  Laterality: Right;   IR URETERAL STENT PLACEMENT EXISTING ACCESS RIGHT  08-21-2007    dr Vernie Ammons  [facility]  LEFT RETROGRADE PYELOGRAM/ LEFT URETERAL STENT PLACEMENT  02-25-2001   dr Annabell Howells [facility]   NEPHROLITHOTOMY Right 05/14/2016   Procedure: NEPHROLITHOTOMY PERCUTANEOUS;  Surgeon: Marcine Matar, MD;  Location: [facility];  Service: Urology;  Laterality: Right;   OPEN REDUCTION INTERNAL FIXATION (ORIF) FOOT LISFRANC FRACTURE Right 09/10/2019   Procedure: OPEN REDUCTION INTERNAL FIXATION (ORIF) FOOT LISFRANC FRACTURE;  Surgeon: Terance Hart, MD;  Location: [facility];   Service: Orthopedics;  Laterality: Right;   ORIF ANKLE FRACTURE Left 09/28/2021   Procedure: OPEN TREATMENT OF LEFT TRIMALLEOLAR FRACTURE WITHOUT POSTERIOR FIXATION, OPEN TREATMENT OF LEFT SYNDESMOSIS DISRUPTION;  Surgeon: Terance Hart, MD;  Location[facility];  Service: Orthopedics;  Laterality: Left;   REPAIR EXTENSOR TENDON WITH METATARSAL OSTEOTOMY AND OPEN REDUCTION IN Right 09/10/2019   Procedure: OPEN TREATMENT RIGHT FIRST, SECOND, THIRD TARSOMETATARSAL LISFRANC, MIDFOOT FUSION;  Surgeon: Terance Hart, MD;  Location: [facility];  Service: Orthopedics;  Laterality: Right;   SHOULDER ARTHROSCOPY Bilateral left 11-13-2000;  right 02-18-2018   TONSILLECTOMY      Current Outpatient Medications on File Prior to Visit  Medication Sig Dispense Refill   amLODipine (NORVASC) 5 MG tablet Take 1 tablet (5 mg total) by mouth daily. 90 tablet 1   aspirin EC 81 MG tablet Take 4 tablets (325 mg total) by mouth daily. Swallow whole. 30 tablet 0   atorvastatin (LIPITOR) 80 MG tablet Take 80 mg by mouth daily.     BD INSULIN SYRINGE U/F 31G X 5/16" 0.3 ML MISC Use 1 syringe as directed twice a day     cetirizine (ZYRTEC) 10 MG tablet Take 1 tablet (10 mg total) by mouth daily. 30 tablet 11   glipiZIDE (GLUCOTROL) 10 MG tablet Take 1 tablet (10 mg total) by mouth 2 (two) times daily before a meal. 180 tablet 1   insulin NPH Human (NOVOLIN N) 100 UNIT/ML injection Inject 0.2 mLs (20 Units total) into the skin in the morning and at bedtime. 10 mL 1   levothyroxine (SYNTHROID) 50 MCG tablet Take 50 mcg by mouth daily before breakfast.     losartan (COZAAR) 25 MG tablet Take 25 mg by mouth daily.     OZEMPIC, 0.25 OR 0.5 MG/DOSE, 2 MG/3ML SOPN Inject into the skin.     PHENobarbital (LUMINAL) 97.2 MG tablet TAKE 1 TABLET BY MOUTH EVERY DAY 90 tablet 0   tiZANidine (ZANAFLEX) 4 MG tablet TAKE 1 TABLET BY MOUTH EVERY 4-6 HOURS AS NEEDED FOR MUSCLE SPASM (Patient taking differently:  Take 4 mg by mouth as needed for muscle spasms.) 180 tablet 1   zolpidem (AMBIEN) 5 MG tablet TAKE 1 TABLET BY MOUTH AT BEDTIME FOR SLEEP IF NEEDED. 30 tablet 0   No current facility-administered medications on file prior to visit.     Allergies  Allergen Reactions   Compazine Other (See Comments)    Bad headache, vomiting; IV    Cymbalta [Duloxetine Hcl] Other (See Comments)    "Kept me awake"   Methadone Hives   Escitalopram Anxiety   Macrobid [Nitrofurantoin Monohyd Macro] Rash   Nitrofuran Derivatives Rash    Social History   Occupational History   Not on file  Tobacco Use   Smoking status: Never   Smokeless tobacco: Never  Vaping Use   Vaping Use: Never used  Substance and Sexual Activity   Alcohol use: No    Alcohol/week: 0.0 standard drinks of alcohol   Drug use: No   Sexual  activity: Not Currently    Birth control/protection: Post-menopausal    Family History  Problem Relation Age of Onset   Heart disease Father    Diabetes Father    Migraines Maternal Grandmother    Seizures Maternal Grandmother     Immunization History  Administered Date(s) Administered   DTaP 07/25/2014   Influenza Split 06/18/2013, 05/29/2017   Influenza Whole 05/30/2012   Influenza-Unspecified 07/25/2014, 06/06/2015, 05/31/2016, 05/15/2018   PFIZER(Purple Top)SARS-COV-2 Vaccination 11/21/2019, 12/16/2019, 07/30/2020   Pneumococcal Conjugate-13 12/27/2016   Pneumococcal Polysaccharide-23 07/22/2012, 06/06/2015   Pneumococcal-Unspecified 07/01/1998, 08/02/2007   Tdap 12/23/2013   Zoster, Live 10/02/2011    Objective: Vitals:   03/15/23 0856 03/15/23 0857  BP: (!) 174/77 (!) 168/68   [name] is a pleasant 72 y.o. female in NAD. AAO X 3.  Vascular Examination: CFT <3 seconds b/l. DP pulses faintly palpable b/l. PT pulses nonpalpable b/l. Digital hair absent. Skin temperature gradient warm to warm b/l. No pain with calf compression. No ischemia or gangrene. No cyanosis or  clubbing noted b/l. Trace edema noted BLE.   Neurological Examination: Sensation grossly intact b/l with 10 gram monofilament. Vibratory sensation intact b/l. Pt has subjective symptoms of neuropathy.  Dermatological Examination: Pedal skin warm and supple b/l. No open wounds b/l. No interdigital macerations. Toenails 1-5 b/l thick, discolored, elongated with subungual debris and pain on dorsal palpation.  No hyperkeratotic nor porokeratotic lesions present on today's visit.  Musculoskeletal Examination: Muscle strength 5/5 to all LE muscle groups of right lower extremity. Muscle strength 3/5 to all LE muscle groups of left lower extremity. No pain, crepitus or joint limitation noted with ROM bilateral LE. Pes planus deformity noted bilateral LE. Utilizes wheelchair for mobility assistance.  Radiographs: None  Last HgA1c:      Latest Ref Rng & Units 02/07/2023    3:52 PM  Hemoglobin A1C  Hemoglobin-A1c 4.0 - 5.6 % 7.1    Assessment: 1. Pain due to onychomycosis of toenails of both feet   2. Pes planus of both feet   3. Hemiparesis affecting left side as late effect of cerebrovascular accident (CVA) (HCC)   4. Type 2 diabetes mellitus with diabetic polyneuropathy, without long-term current use of insulin (HCC)   5. Encounter for diabetic foot exam (HCC)     ADA Risk Categorization: High Risk:  Patient has one or more of the following: Loss of protective sensation Absent pedal pulses Severe Foot deformity History of foot ulcer  Plan: -Patient was evaluated and treated. All patient's and/or POA's questions/concerns answered on today's visit. -Diabetic foot examination performed today. -Continue diabetic foot care principles: inspect feet daily, monitor glucose as recommended by PCP and/or Endocrinologist, and follow prescribed diet per PCP, Endocrinologist and/or dietician. -Patient to continue soft, supportive shoe gear daily. -Toenails 1-5 b/l were debrided in length and girth  with sterile nail nippers and dremel without iatrogenic bleeding.  -Patient/POA to call should there be question/concern in the interim.  Return in about 3 months (around 06/15/2023).  Freddie Breech, DPM

## 2023-04-02 NOTE — Patient Instructions (Incomplete)
Natalie [name] , Thank you for taking time to come for your Medicare Wellness Visit. I appreciate your ongoing commitment to your health goals. Please review the following plan we discussed and let me know if I can assist you in the future.   These are the goals we discussed:  Goals   None     This is a list of the screening recommended for you and due dates:  Health Maintenance  Topic Date Due   Zoster (Shingles) Vaccine (1 of 2) Never done   Medicare Annual Wellness Visit  01/28/2018   Mammogram  01/29/2019   Yearly kidney health urinalysis for diabetes  08/08/2019   Eye exam for diabetics  11/22/2019   COVID-19 Vaccine (4 - 2023-24 season) 06/01/2022   Flu Shot  05/02/2023   Hemoglobin A1C  08/10/2023   Yearly kidney function blood test for diabetes  12/24/2023   Complete foot exam   03/14/2024   DTaP/Tdap/Td vaccine (3 - Td or Tdap) 07/25/2024   Pneumonia Vaccine  Completed   DEXA scan (bone density measurement)  Completed   Hepatitis C Screening  Completed   HPV Vaccine  Aged Out   Cologuard (Stool DNA test)  Discontinued    Advanced directives: Information on Advanced Care Planning can be found at [facility] of [facility] Advance Health Care Directives Advance Health Care Directives ([email or web address] Please bring a copy of your health care power of attorney and living will to the office to be added to your chart at your convenience.  Conditions/risks identified: Aim for 30 minutes of exercise or brisk walking, 6-8 glasses of water, and 5 servings of fruits and vegetables each day.  Next appointment: Follow up in one year for your annual wellness visit    Preventive Care 65 Years and Older, Female Preventive care refers to lifestyle choices and visits with your health care provider that can promote health and wellness. What does preventive care include? A yearly physical exam. This is also called an annual well check. Dental exams once or twice a year. Routine eye exams.  Ask your health care provider how often you should have your eyes checked. Personal lifestyle choices, including: Daily care of your teeth and gums. Regular physical activity. Eating a healthy diet. Avoiding tobacco and drug use. Limiting alcohol use. Practicing safe sex. Taking low-dose aspirin every day. Taking vitamin and mineral supplements as recommended by your health care provider. What happens during an annual well check? The services and screenings done by your health care provider during your annual well check will depend on your age, overall health, lifestyle risk factors, and family history of disease. Counseling  Your health care provider may ask you questions about your: Alcohol use. Tobacco use. Drug use. Emotional well-being. Home and relationship well-being. Sexual activity. Eating habits. History of falls. Memory and ability to understand (cognition). Work and work Astronomer. Reproductive health. Screening  You may have the following tests or measurements: Height, weight, and BMI. Blood pressure. Lipid and cholesterol levels. These may be checked every 5 years, or more frequently if you are over 52 years old. Skin check. Lung cancer screening. You may have this screening every year starting at age 48 if you have a 30-pack-year history of smoking and currently smoke or have quit within the past 15 years. Fecal occult blood test (FOBT) of the stool. You may have this test every year starting at age 79. Flexible sigmoidoscopy or colonoscopy. You may have a sigmoidoscopy every 5 years or  a colonoscopy every 10 years starting at age 67. Hepatitis C blood test. Hepatitis B blood test. Sexually transmitted disease (STD) testing. Diabetes screening. This is done by checking your blood sugar (glucose) after you have not eaten for a while (fasting). You may have this done every 1-3 years. Bone density scan. This is done to screen for osteoporosis. You may have this  done starting at age 108. Mammogram. This may be done every 1-2 years. Talk to your health care provider about how often you should have regular mammograms. Talk with your health care provider about your test results, treatment options, and if necessary, the need for more tests. Vaccines  Your health care provider may recommend certain vaccines, such as: Influenza vaccine. This is recommended every year. Tetanus, diphtheria, and acellular pertussis (Tdap, Td) vaccine. You may need a Td booster every 10 years. Zoster vaccine. You may need this after age 73. Pneumococcal 13-valent conjugate (PCV13) vaccine. One dose is recommended after age 7. Pneumococcal polysaccharide (PPSV23) vaccine. One dose is recommended after age 48. Talk to your health care provider about which screenings and vaccines you need and how often you need them. This information is not intended to replace advice given to you by your health care provider. Make sure you discuss any questions you have with your health care provider. Document Released: 10/14/2015 Document Revised: 06/06/2016 Document Reviewed: 07/19/2015 Elsevier Interactive Patient Education  2017 [employer].  Fall Prevention in the Home Falls can cause injuries. They can happen to people of all ages. There are many things you can do to make your home safe and to help prevent falls. What can I do on the outside of my home? Regularly fix the edges of walkways and driveways and fix any cracks. Remove anything that might make you trip as you walk through a door, such as a raised step or threshold. Trim any bushes or trees on the path to your home. Use bright outdoor lighting. Clear any walking paths of anything that might make someone trip, such as rocks or tools. Regularly check to see if handrails are loose or broken. Make sure that both sides of any steps have handrails. Any raised decks and porches should have guardrails on the edges. Have any leaves,  snow, or ice cleared regularly. Use sand or salt on walking paths during winter. Clean up any spills in your garage right away. This includes oil or grease spills. What can I do in the bathroom? Use night lights. Install grab bars by the toilet and in the tub and shower. Do not use towel bars as grab bars. Use non-skid mats or decals in the tub or shower. If you need to sit down in the shower, use a plastic, non-slip stool. Keep the floor dry. Clean up any water that spills on the floor as soon as it happens. Remove soap buildup in the tub or shower regularly. Attach bath mats securely with double-sided non-slip rug tape. Do not have throw rugs and other things on the floor that can make you trip. What can I do in the bedroom? Use night lights. Make sure that you have a light by your bed that is easy to reach. Do not use any sheets or blankets that are too big for your bed. They should not hang down onto the floor. Have a firm chair that has side arms. You can use this for support while you get dressed. Do not have throw rugs and other things on the floor that  can make you trip. What can I do in the kitchen? Clean up any spills right away. Avoid walking on wet floors. Keep items that you use a lot in easy-to-reach places. If you need to reach something above you, use a strong step stool that has a grab bar. Keep electrical cords out of the way. Do not use floor polish or wax that makes floors slippery. If you must use wax, use non-skid floor wax. Do not have throw rugs and other things on the floor that can make you trip. What can I do with my stairs? Do not leave any items on the stairs. Make sure that there are handrails on both sides of the stairs and use them. Fix handrails that are broken or loose. Make sure that handrails are as long as the stairways. Check any carpeting to make sure that it is firmly attached to the stairs. Fix any carpet that is loose or worn. Avoid having throw  rugs at the top or bottom of the stairs. If you do have throw rugs, attach them to the floor with carpet tape. Make sure that you have a light switch at the top of the stairs and the bottom of the stairs. If you do not have them, ask someone to add them for you. What else can I do to help prevent falls? Wear shoes that: Do not have high heels. Have rubber bottoms. Are comfortable and fit you well. Are closed at the toe. Do not wear sandals. If you use a stepladder: Make sure that it is fully opened. Do not climb a closed stepladder. Make sure that both sides of the stepladder are locked into place. Ask someone to hold it for you, if possible. Clearly mark and make sure that you can see: Any grab bars or handrails. First and last steps. Where the edge of each step is. Use tools that help you move around (mobility aids) if they are needed. These include: Canes. Walkers. Scooters. Crutches. Turn on the lights when you go into a dark area. Replace any light bulbs as soon as they burn out. Set up your furniture so you have a clear path. Avoid moving your furniture around. If any of your floors are uneven, fix them. If there are any pets around you, be aware of where they are. Review your medicines with your doctor. Some medicines can make you feel dizzy. This can increase your chance of falling. Ask your doctor what other things that you can do to help prevent falls. This information is not intended to replace advice given to you by your health care provider. Make sure you discuss any questions you have with your health care provider. Document Released: 07/14/2009 Document Revised: 02/23/2016 Document Reviewed: 10/22/2014 Elsevier Interactive Patient Education  2017 [employer].

## 2023-04-03 ENCOUNTER — Ambulatory Visit (INDEPENDENT_AMBULATORY_CARE_PROVIDER_SITE_OTHER): Payer: Medicare Other

## 2023-04-03 VITALS — Ht <= 58 in | Wt 151.0 lb

## 2023-04-03 DIAGNOSIS — Z0000 Encounter for general adult medical examination without abnormal findings: Secondary | ICD-10-CM | POA: Diagnosis not present

## 2023-04-03 NOTE — Progress Notes (Signed)
Subjective:   Natalie [name] is a 72 y.o. female who presents for Medicare Annual (Subsequent) preventive examination.  Visit Complete: Virtual  I connected with  Natalie [name] on 04/03/23 by a audio enabled telemedicine application and verified that I am speaking with the correct person using two identifiers.  Patient Location: Home  Provider Location: Home Office  I discussed the limitations of evaluation and management by telemedicine. The patient expressed understanding and agreed to proceed.  Review of Systems     Cardiac Risk Factors include: advanced age (>65men, >14 women);diabetes mellitus;dyslipidemia;hypertension;sedentary lifestyle     Objective:    Today's Vitals   04/03/23 1652  Weight: 151 lb (68.5 kg)  Height: 4\' 8"  (1.422 m)   Body mass index is 33.85 kg/m.     04/03/2023    5:24 PM 09/28/2021    2:26 PM 12/27/2020    9:35 AM 11/29/2020    3:27 PM 09/06/2020    3:00 PM 09/02/2020    1:09 PM 09/02/2020   10:15 AM  Advanced Directives  Does Patient Have a Medical Advance Directive? No Yes Yes Yes No No No  Type of Advance Directive  Living will Living will Living will     Does patient want to make changes to medical advance directive?  No - Patient declined No - Patient declined No - Patient declined     Would patient like information on creating a medical advance directive? Yes (MAU/Ambulatory/Procedural Areas - Information given)    No - Patient declined No - Patient declined No - Patient declined    Current Medications (verified) Outpatient Encounter Medications as of 04/03/2023  Medication Sig   amLODipine (NORVASC) 5 MG tablet Take 1 tablet (5 mg total) by mouth daily.   aspirin EC 81 MG tablet Take 4 tablets (325 mg total) by mouth daily. Swallow whole.   atorvastatin (LIPITOR) 80 MG tablet Take 80 mg by mouth daily.   BD INSULIN SYRINGE U/F 31G X 5/16" 0.3 ML MISC Use 1 syringe as directed twice a day   cetirizine (ZYRTEC) 10 MG tablet Take 1 tablet  (10 mg total) by mouth daily.   glipiZIDE (GLUCOTROL) 10 MG tablet Take 1 tablet (10 mg total) by mouth 2 (two) times daily before a meal.   insulin NPH Human (NOVOLIN N) 100 UNIT/ML injection Inject 0.2 mLs (20 Units total) into the skin in the morning and at bedtime.   levothyroxine (SYNTHROID) 50 MCG tablet Take 50 mcg by mouth daily before breakfast.   losartan (COZAAR) 25 MG tablet Take 25 mg by mouth daily.   OZEMPIC, 0.25 OR 0.5 MG/DOSE, 2 MG/3ML SOPN Inject into the skin.   PHENobarbital (LUMINAL) 97.2 MG tablet TAKE 1 TABLET BY MOUTH EVERY DAY   tiZANidine (ZANAFLEX) 4 MG tablet TAKE 1 TABLET BY MOUTH EVERY 4-6 HOURS AS NEEDED FOR MUSCLE SPASM (Patient taking differently: Take 4 mg by mouth as needed for muscle spasms.)   zolpidem (AMBIEN) 5 MG tablet TAKE 1 TABLET BY MOUTH AT BEDTIME FOR SLEEP IF NEEDED.   No facility-administered encounter medications on file as of 04/03/2023.    Allergies (verified) Compazine, Cymbalta [duloxetine hcl], Methadone, Escitalopram, Macrobid [nitrofurantoin monohyd macro], and Nitrofuran derivatives   History: Past Medical History:  Diagnosis Date   Arthritis    Atrophy of left kidney    Cervicalgia    Chronic kidney disease, stage 3 (HCC)    acute aki 03-14-2018 in setting pyelonehritis-- resolved   Chronic migraine  Chronic pain syndrome    History of kidney stones    History of pyelonephritis    hx recurrent   History of recurrent UTIs    History of sepsis    07/ 2017 secondary to pyelonephritis   Hypertension    Hypothyroidism    Iron deficiency anemia    Left ureteral stone    Mixed hyperlipidemia    Nephrolithiasis    right side nonobstructive per CT 03-14-2018   Right shoulder pain    post sx 02-18-2018   Seizure disorder (HCC) followed by pcp   per pt dx age 46--  no seizure since 1980's , controlled w/ phenobarbitol   Stroke (HCC) 08/2020   no deficits   Type 2 diabetes mellitus (HCC)    followed by pcp   Wears glasses     Past Surgical History:  Procedure Laterality Date   CESAREAN SECTION  1974   CHOLECYSTECTOMY OPEN  2002   CYSTO/  LEFT RETROGRADE PYELOGRAM  06-11-2006   dr Annabell Howells  [facility]   CYSTOSCOPY WITH RETROGRADE PYELOGRAM, URETEROSCOPY AND STENT PLACEMENT Left 04/10/2018   Procedure: CYSTOSCOPY WITH RETROGRADE PYELOGRAM, URETEROSCOPY AND STENT PLACEMENT;  Surgeon: Marcine Matar, MD;  Location: [facility];  Service: Urology;  Laterality: Left;   EYE SURGERY Bilateral    cataract removal   FOOT ARTHRODESIS Right 09/10/2019   Procedure: ARTHRODESIS FOOT;  Surgeon: Terance Hart, MD;  Location: [facility];  Service: Orthopedics;  Laterality: Right;   HARDWARE REMOVAL Right 12/27/2020   Procedure: DEEP ORTHOPEDIC HARDWARE REMOVAL, RIGHT FOOT;  Surgeon: Terance Hart, MD;  Location: [facility];  Service: Orthopedics;  Laterality: Right;   IR URETERAL STENT PLACEMENT EXISTING ACCESS RIGHT  08-21-2007    dr Vernie Ammons  [facility]   LEFT RETROGRADE PYELOGRAM/ LEFT URETERAL STENT PLACEMENT  02-25-2001   dr Annabell Howells [facility]   NEPHROLITHOTOMY Right 05/14/2016   Procedure: NEPHROLITHOTOMY PERCUTANEOUS;  Surgeon: Marcine Matar, MD;  Location: [facility];  Service: Urology;  Laterality: Right;   OPEN REDUCTION INTERNAL FIXATION (ORIF) FOOT LISFRANC FRACTURE Right 09/10/2019   Procedure: OPEN REDUCTION INTERNAL FIXATION (ORIF) FOOT LISFRANC FRACTURE;  Surgeon: Terance Hart, MD;  Location: [facility];  Service: Orthopedics;  Laterality: Right;   ORIF ANKLE FRACTURE Left 09/28/2021   Procedure: OPEN TREATMENT OF LEFT TRIMALLEOLAR FRACTURE WITHOUT POSTERIOR FIXATION, OPEN TREATMENT OF LEFT SYNDESMOSIS DISRUPTION;  Surgeon: Terance Hart, MD;  Location: [facility];  Service: Orthopedics;  Laterality: Left;   REPAIR EXTENSOR TENDON WITH METATARSAL OSTEOTOMY AND OPEN REDUCTION IN Right 09/10/2019   Procedure: OPEN TREATMENT RIGHT FIRST, SECOND, THIRD  TARSOMETATARSAL LISFRANC, MIDFOOT FUSION;  Surgeon: Terance Hart, MD;  Location: [facility];  Service: Orthopedics;  Laterality: Right;   SHOULDER ARTHROSCOPY Bilateral left 11-13-2000;  right 02-18-2018   TONSILLECTOMY     Family History  Problem Relation Age of Onset   Heart disease Father    Diabetes Father    Migraines Maternal Grandmother    Seizures Maternal Grandmother    Social History   Socioeconomic History   Marital status: Married    Spouse name: david   Number of children: 2   Years of education: 2 years of college   Highest education level: Not on file  Occupational History   Not on file  Tobacco Use   Smoking status: Never   Smokeless tobacco: Never  Vaping Use   Vaping Use: Never used  Substance and Sexual  Activity   Alcohol use: No    Alcohol/week: 0.0 standard drinks of alcohol   Drug use: No   Sexual activity: Not Currently    Birth control/protection: Post-menopausal  Other Topics Concern   Not on file  Social History Narrative   Was a home maker       Armond Hang    Social Determinants of Health   Financial Resource Strain: Low Risk  (04/03/2023)   Overall Financial Resource Strain (CARDIA)    Difficulty of Paying Living Expenses: Not hard at all  Food Insecurity: No Food Insecurity (04/03/2023)   Hunger Vital Sign    Worried About Running Out of Food in the Last Year: Never true    Ran Out of Food in the Last Year: Never true  Transportation Needs: No Transportation Needs (04/03/2023)   PRAPARE - Administrator, Civil Service (Medical): No    Lack of Transportation (Non-Medical): No  Physical Activity: Inactive (04/03/2023)   Exercise Vital Sign    Days of Exercise per Week: 0 days    Minutes of Exercise per Session: 0 min  Stress: No Stress Concern Present (04/03/2023)   [employer] of Occupational Health - Occupational Stress Questionnaire    Feeling of Stress : Not at all  Social Connections:  Moderately Integrated (04/03/2023)   Social Connection and Isolation Panel [NHANES]    Frequency of Communication with Friends and Family: More than three times a week    Frequency of Social Gatherings with Friends and Family: Three times a week    Attends Religious Services: 1 to 4 times per year    Active Member of Clubs or Organizations: No    Attends Banker Meetings: Never    Marital Status: Married    Tobacco Counseling Counseling given: Not Answered   Clinical Intake:  Pre-visit preparation completed: Yes  Pain : No/denies pain     Diabetes: Yes CBG done?: No Did pt. bring in CBG monitor from home?: No  How often do you need to have someone help you when you read instructions, pamphlets, or other written materials from your doctor or pharmacy?: 1 - Never  Interpreter Needed?: No  Information entered by :: Kandis Fantasia LPN   Activities of Daily Living    04/03/2023    5:22 PM  In your present state of health, do you have any difficulty performing the following activities:  Hearing? 0  Vision? 0  Difficulty concentrating or making decisions? 0  Walking or climbing stairs? 0  Dressing or bathing? 0  Doing errands, shopping? 0  Preparing Food and eating ? N  Using the Toilet? N  In the past six months, have you accidently leaked urine? N  Do you have problems with loss of bowel control? N  Managing your Medications? N  Managing your Finances? N  Housekeeping or managing your Housekeeping? N    Patient Care Team: Eden Emms, NP as PCP - General (Nurse Practitioner) Blima Ledger, OD as Consulting Physician (Optometry) Jones Broom, MD as Consulting Physician (Orthopedic Surgery) Freddie Breech, DPM as Consulting Physician (Podiatry) Reva Bores, MD as Consulting Physician (Obstetrics and Gynecology) Butch Penny, NP as Registered Nurse (Neurology)  Indicate any recent Medical Services you may have received from other than  [facility] providers in the past year (date may be approximate).     Assessment:   This is a routine wellness examination for [place].  Hearing/Vision screen Hearing Screening -  Comments:: Denies hearing difficulties   Vision Screening - Comments:: Wears rx glasses - up to date with routine eye exams with Dr. Blima Ledger    Dietary issues and exercise activities discussed:     Goals Addressed             This Visit's Progress    Prevent falls         Depression Screen    04/03/2023    5:19 PM 02/07/2023    4:29 PM 11/15/2020    2:29 PM 10/05/2020    1:08 PM 12/01/2018    3:41 PM 08/07/2018   11:20 AM 05/05/2018    4:33 PM  PHQ 2/9 Scores  PHQ - 2 Score 0 0 0 0 0 0 0  PHQ- 9 Score 2 2  0       Fall Risk    04/03/2023    5:21 PM 02/07/2023    4:29 PM 11/15/2020    2:29 PM 10/05/2020    1:08 PM 12/01/2018    3:41 PM  Fall Risk   Falls in the past year? 1 1 0 0 0  Number falls in past yr: 0 0     Injury with Fall? 1 1   0  Risk for fall due to : Impaired balance/gait;Impaired mobility;History of fall(s) Impaired balance/gait;Impaired mobility     Follow up Falls evaluation completed;Education provided;Falls prevention discussed Falls evaluation completed       MEDICARE RISK AT HOME:  Medicare Risk at Home - 04/03/23 1721     Any stairs in or around the home? No    If so, are there any without handrails? No    Home free of loose throw rugs in walkways, pet beds, electrical cords, etc? Yes    Adequate lighting in your home to reduce risk of falls? Yes    Life alert? No    Use of a cane, walker or w/c? Yes    Grab bars in the bathroom? Yes    Shower chair or bench in shower? Yes    Elevated toilet seat or a handicapped toilet? Yes             TIMED UP AND GO:  Was the test performed?  No    Cognitive Function:        04/03/2023    5:24 PM  6CIT Screen  What Year? 0 points  What month? 0 points  What time? 0 points  Count back from 20 0 points  Months in reverse 2  points  Repeat phrase 2 points  Total Score 4 points    Immunizations Immunization History  Administered Date(s) Administered   DTaP 07/25/2014   Influenza Split 06/18/2013, 05/29/2017   Influenza Whole 05/30/2012   Influenza-Unspecified 07/25/2014, 06/06/2015, 05/31/2016, 05/15/2018   PFIZER(Purple Top)SARS-COV-2 Vaccination 11/21/2019, 12/16/2019, 07/30/2020   Pneumococcal Conjugate-13 12/27/2016   Pneumococcal Polysaccharide-23 07/22/2012, 06/06/2015   Pneumococcal-Unspecified 07/01/1998, 08/02/2007   Tdap 12/23/2013   Zoster Recombinant(Shingrix) 12/09/2018   Zoster, Live 10/02/2011    TDAP status: Up to date  Pneumococcal vaccine status: Up to date  Covid-19 vaccine status: Information provided on how to obtain vaccines.   Qualifies for Shingles Vaccine? Yes   Zostavax completed Yes   Shingrix Completed?: Yes  Screening Tests Health Maintenance  Topic Date Due   Zoster Vaccines- Shingrix (2 of 2) 02/03/2019   Diabetic kidney evaluation - Urine ACR  08/08/2019   OPHTHALMOLOGY EXAM  11/22/2019   COVID-19 Vaccine (4 - 2023-24  season) 06/01/2022   MAMMOGRAM  04/02/2024 (Originally 01/29/2019)   INFLUENZA VACCINE  05/02/2023   HEMOGLOBIN A1C  08/10/2023   Diabetic kidney evaluation - eGFR measurement  12/24/2023   FOOT EXAM  03/14/2024   Medicare Annual Wellness (AWV)  04/02/2024   DTaP/Tdap/Td (3 - Td or Tdap) 07/25/2024   Pneumonia Vaccine 29+ Years old  Completed   DEXA SCAN  Completed   Hepatitis C Screening  Completed   HPV VACCINES  Aged Out   Fecal DNA (Cologuard)  Discontinued    Health Maintenance  Health Maintenance Due  Topic Date Due   Zoster Vaccines- Shingrix (2 of 2) 02/03/2019   Diabetic kidney evaluation - Urine ACR  08/08/2019   OPHTHALMOLOGY EXAM  11/22/2019   COVID-19 Vaccine (4 - 2023-24 season) 06/01/2022    Colorectal cancer screening: Type of screening: Cologuard. Completed 09/02/18. Repeat every 3 years  Mammogram status:  Patient  declines at this time   Bone Density status: Completed 01/28/17. Results reflect: Bone density results: OSTEOPENIA. Repeat every 2 years.  Lung Cancer Screening: (Low Dose CT Chest recommended if Age 55-80 years, 20 pack-year currently smoking OR have quit w/in 15years.) does not qualify.   Lung Cancer Screening Referral: n/a  Additional Screening:  Hepatitis C Screening: does qualify; Completed 08/31/15  Vision Screening: Recommended annual ophthalmology exams for early detection of glaucoma and other disorders of the eye. Is the patient up to date with their annual eye exam?  Yes  Who is the provider or what is the name of the office in which the patient attends annual eye exams? Dr. Blima Ledger If pt is not established with a provider, would they like to be referred to a provider to establish care? No .   Dental Screening: Recommended annual dental exams for proper oral hygiene  Diabetic Foot Exam: Diabetic Foot Exam: Completed 03/15/23  Community Resource Referral / Chronic Care Management: CRR required this visit?  No   CCM required this visit?  No     Plan:     I have personally reviewed and noted the following in the patient's chart:   Medical and social history Use of alcohol, tobacco or illicit drugs  Current medications and supplements including opioid prescriptions. Patient is not currently taking opioid prescriptions. Functional ability and status Nutritional status Physical activity Advanced directives List of other physicians Hospitalizations, surgeries, and ER visits in previous 12 months Vitals Screenings to include cognitive, depression, and falls Referrals and appointments  In addition, I have reviewed and discussed with patient certain preventive protocols, quality metrics, and best practice recommendations. A written personalized care plan for preventive services as well as general preventive health recommendations were provided to patient.      Kandis Fantasia [place], [place]   10/09/1476   After Visit Summary: (MyChart) Due to this being a telephonic visit, the after visit summary with patients personalized plan was offered to patient via MyChart   Nurse Notes: Patient states that she has a problem with diabetic shoes and states that one foot is larger than the other due to swelling and past surgeries.

## 2023-04-18 ENCOUNTER — Other Ambulatory Visit: Payer: Self-pay | Admitting: Nurse Practitioner

## 2023-04-18 DIAGNOSIS — G4700 Insomnia, unspecified: Secondary | ICD-10-CM

## 2023-04-24 ENCOUNTER — Other Ambulatory Visit: Payer: Self-pay | Admitting: Nurse Practitioner

## 2023-04-27 ENCOUNTER — Other Ambulatory Visit: Payer: Self-pay | Admitting: Nurse Practitioner

## 2023-05-10 ENCOUNTER — Encounter: Payer: Self-pay | Admitting: Nurse Practitioner

## 2023-05-10 ENCOUNTER — Ambulatory Visit (INDEPENDENT_AMBULATORY_CARE_PROVIDER_SITE_OTHER): Payer: Medicare Other | Admitting: Nurse Practitioner

## 2023-05-10 ENCOUNTER — Other Ambulatory Visit: Payer: Self-pay | Admitting: Neurology

## 2023-05-10 VITALS — BP 130/80 | HR 85 | Temp 98.2°F | Ht <= 58 in | Wt 164.0 lb

## 2023-05-10 DIAGNOSIS — Z794 Long term (current) use of insulin: Secondary | ICD-10-CM

## 2023-05-10 DIAGNOSIS — Z7984 Long term (current) use of oral hypoglycemic drugs: Secondary | ICD-10-CM

## 2023-05-10 DIAGNOSIS — E1122 Type 2 diabetes mellitus with diabetic chronic kidney disease: Secondary | ICD-10-CM | POA: Diagnosis not present

## 2023-05-10 DIAGNOSIS — I10 Essential (primary) hypertension: Secondary | ICD-10-CM

## 2023-05-10 LAB — CBC
HCT: 35 % (ref 35.0–45.0)
Hemoglobin: 11.4 g/dL — ABNORMAL LOW (ref 11.7–15.5)
MCH: 30.6 pg (ref 27.0–33.0)
MCHC: 32.6 g/dL (ref 32.0–36.0)
MCV: 93.8 fL (ref 80.0–100.0)
MPV: 11.6 fL (ref 7.5–12.5)
Platelets: 268 10*3/uL (ref 140–400)
RBC: 3.73 10*6/uL — ABNORMAL LOW (ref 3.80–5.10)
RDW: 12.8 % (ref 11.0–15.0)
WBC: 7 10*3/uL (ref 3.8–10.8)

## 2023-05-10 LAB — POCT GLYCOSYLATED HEMOGLOBIN (HGB A1C): Hemoglobin A1C: 8.1 % — AB (ref 4.0–5.6)

## 2023-05-10 NOTE — Progress Notes (Signed)
Established Patient Office Visit  Subjective   Patient ID: Natalie [name], female    DOB: 1951-03-15  Age: 72 y.o. MRN: [id number]  Chief Complaint  Patient presents with   Diabetes    Pt states she feels fine.    Medication Refill    Pt states she is out of multiple refills. States that she is having trouble with pharmacy.       DM2: Patient currently maintained on glipizide 10 mg twice daily insulin 20 units twice daily Ozempic 0.5 mg weekly.  Patient's previous A1c was 7.1%. states that she is remolding and having trouble with checking her sugars States that she is having trouble remembering where she put her supplies.  States she has been eating a lot of cantaloupe of late as it good while at the in season   HTN: Patient was maintained on losartan 25 mg amlodipine 5 mg.  Is tolerating medication well.  Denies lightheadedness or dizziness    Review of Systems  Constitutional:  Negative for chills and fever.  Respiratory:  Negative for shortness of breath.   Cardiovascular:  Negative for chest pain.  Gastrointestinal:  Negative for abdominal pain, nausea and vomiting.  Musculoskeletal:  Positive for joint pain.  Neurological:  Negative for headaches.      Objective:     BP 130/80   Pulse 85   Temp 98.2 F (36.8 C) (Temporal)   Ht 4\' 8"  (1.422 m)   Wt 164 lb (74.4 kg)   SpO2 94%   BMI 36.77 kg/m  BP Readings from Last 3 Encounters:  05/10/23 130/80  03/15/23 (!) 168/68  02/07/23 118/68   Wt Readings from Last 3 Encounters:  05/10/23 164 lb (74.4 kg)  04/03/23 151 lb (68.5 kg)  02/07/23 151 lb (68.5 kg)      Physical Exam Vitals and nursing note reviewed.  Constitutional:      Appearance: Normal appearance.  Cardiovascular:     Rate and Rhythm: Normal rate and regular rhythm.     Heart sounds: Normal heart sounds.  Pulmonary:     Effort: Pulmonary effort is normal.     Breath sounds: Normal breath sounds.  Abdominal:     General: Bowel sounds are  normal.  Neurological:     Mental Status: She is alert.      Results for orders placed or performed in visit on 05/10/23  POCT glycosylated hemoglobin (Hb A1C)  Result Value Ref Range   Hemoglobin A1C 8.1 (A) 4.0 - 5.6 %   HbA1c POC (<> result, manual entry)     HbA1c, POC (prediabetic range)     HbA1c, POC (controlled diabetic range)        The ASCVD Risk score (Arnett DK, et al., 2019) failed to calculate for the following reasons:   The patient has a prior MI or stroke diagnosis    Assessment & Plan:   Problem List Items Addressed This Visit       Cardiovascular and Mediastinum   HTN (hypertension)    Maintained on losartan and amlodipine blood pressure under good control patient tolerates medication well denies lightheadedness or dizziness continue medication as prescribed      Relevant Orders   CBC   Comprehensive metabolic panel     Endocrine   Diabetes mellitus, type II (HCC) - Primary    Patient on Ozempic 0.5, glipizide 10 twice daily, NPH 20 units twice daily patient's A1c's were not.  Patient is in the midst of  a remodel per her report.  She has gained weight.  Her of Being Mindful of What and How Much She Ate Will Not Change Medication at This Juncture Give Patient Time to work on lifestyle modification inclusive of diet if A1c is the same or higher Consider increasing Ozempic      Relevant Orders   POCT glycosylated hemoglobin (Hb A1C) (Completed)   CBC   Comprehensive metabolic panel    Return in about 3 months (around 08/10/2023) for DM recheck.    Audria Nine, NP

## 2023-05-10 NOTE — Assessment & Plan Note (Signed)
Maintained on losartan and amlodipine blood pressure under good control patient tolerates medication well denies lightheadedness or dizziness continue medication as prescribed

## 2023-05-10 NOTE — Assessment & Plan Note (Signed)
Patient on Ozempic 0.5, glipizide 10 twice daily, NPH 20 units twice daily patient's A1c's were not.  Patient is in the midst of a remodel per her report.  She has gained weight.  Her of Being Mindful of What and How Much She Ate Will Not Change Medication at This Juncture Give Patient Time to work on lifestyle modification inclusive of diet if A1c is the same or higher Consider increasing Ozempic

## 2023-05-10 NOTE — Patient Instructions (Signed)
Good to see you today Your A1C went up to 8.1% be mindful of what and how much you are eating. If the number has not improved next time we will increase the ozempic Call the foot doctor office and let them know you want to do the diabetic shoes Follow up with me in 3 months, sooner if you need me

## 2023-05-15 ENCOUNTER — Other Ambulatory Visit: Payer: Self-pay | Admitting: Anesthesiology

## 2023-05-16 ENCOUNTER — Telehealth: Payer: Self-pay | Admitting: Adult Health

## 2023-05-16 NOTE — Telephone Encounter (Signed)
Pt calling to check on status of refill request for PHENobarbital (LUMINAL) 97.2 MG tablet.

## 2023-05-16 NOTE — Telephone Encounter (Signed)
I have routed the refill request to Butch Penny, NP

## 2023-05-16 NOTE — Telephone Encounter (Signed)
Requested Prescriptions   Pending Prescriptions Disp Refills   PHENobarbital (LUMINAL) 97.2 MG tablet [Pharmacy Med Name: PHENOBARBITAL 97.2 MG TABLET] 90 tablet 0    Sig: TAKE 1 TABLET BY MOUTH EVERY DAY   LAST SEEN 02/05/23, next appt 08/12/23 Dispenses  Routing to provider to fill Dispensed Days Supply Quantity Provider Pharmacy  PHENOBARBITAL 97.2 MG TABLET 02/13/2023 90 90 each Micki Riley, MD [facility] [phone] - [facility]...  PHENOBARBITAL 97.2 MG TABLET 11/18/2022 90 90 each Micki Riley, MD [facility] [phone] - [facility]...  PHENOBARBITAL 97.2 MG TABLET 08/20/2022 90 90 each Micki Riley, MD [facility] [phone] - [facility]...  PHENOBARBITAL 97.2 MG TABLET 07/11/2022 35 50 each Lula Olszewski, MD [facility] [phone] - [facility]...  phenobarbital 97.2 mg tablet 07/11/2022 30  Lula Olszewski, MD [facility] [phone] - [facility]...  PHENOBARBITAL 97.2 MG TABLET 05/22/2022 35 50 each Lula Olszewski, MD [facility] [phone] - [facility]...  phenobarbital 97.2 mg tablet 05/22/2022 30  Lula Olszewski, MD [facility] [phone] - [address] [address] [address]

## 2023-05-18 ENCOUNTER — Other Ambulatory Visit: Payer: Self-pay | Admitting: Nurse Practitioner

## 2023-05-18 DIAGNOSIS — G4700 Insomnia, unspecified: Secondary | ICD-10-CM

## 2023-05-18 DIAGNOSIS — I10 Essential (primary) hypertension: Secondary | ICD-10-CM

## 2023-05-27 ENCOUNTER — Telehealth: Payer: Self-pay

## 2023-05-27 DIAGNOSIS — E039 Hypothyroidism, unspecified: Secondary | ICD-10-CM

## 2023-05-27 MED ORDER — LEVOTHYROXINE SODIUM 50 MCG PO TABS: 50.0000 ug | ORAL_TABLET | Freq: Every day | ORAL | 0 refills | Status: DC

## 2023-05-27 NOTE — Addendum Note (Signed)
Addended by: Eden Emms on: 05/27/2023 04:44 PM   Modules accepted: Orders

## 2023-05-27 NOTE — Telephone Encounter (Signed)
Patient stated she has been out of her levothyroxine 50 mcg for about 2 weeks now. She stated a previous provider prescribed this for her.  Advised her we do not have any recent thyroid testing on file for her, last TSH lab was in 2019. Advised would send to [facility] for review

## 2023-05-27 NOTE — Telephone Encounter (Signed)
Called and advised patient, scheduled patient 6 week lab appt.

## 2023-05-27 NOTE — Telephone Encounter (Signed)
I will refill it and she needs a lab appointment in 6 weeks to have her thyroid levels checked

## 2023-06-21 ENCOUNTER — Other Ambulatory Visit: Payer: Self-pay | Admitting: Nurse Practitioner

## 2023-06-21 DIAGNOSIS — G4700 Insomnia, unspecified: Secondary | ICD-10-CM

## 2023-06-24 ENCOUNTER — Ambulatory Visit: Payer: Medicare Other | Admitting: Podiatry

## 2023-06-25 ENCOUNTER — Ambulatory Visit (INDEPENDENT_AMBULATORY_CARE_PROVIDER_SITE_OTHER): Payer: Medicare Other | Admitting: Podiatry

## 2023-06-25 DIAGNOSIS — E1142 Type 2 diabetes mellitus with diabetic polyneuropathy: Secondary | ICD-10-CM

## 2023-06-25 DIAGNOSIS — M79674 Pain in right toe(s): Secondary | ICD-10-CM | POA: Diagnosis not present

## 2023-06-25 DIAGNOSIS — B351 Tinea unguium: Secondary | ICD-10-CM

## 2023-06-25 DIAGNOSIS — M79675 Pain in left toe(s): Secondary | ICD-10-CM | POA: Diagnosis not present

## 2023-06-25 NOTE — Progress Notes (Signed)
Subjective:  Patient ID: Natalie [name], female    DOB: 09/29/1951,  MRN: [id number]  Chief Complaint  Patient presents with   Diabetes    Would like to talk about shoes    72 y.o. female returns for the above complaint.  Patient presents with thickened likely dystrophic mycotic toenails x 10 mild pain on palpation hurts with ambulation worse with pressure patient likely debrided and she has not seen anyone as prior to seeing me denies any other acute complaints  Objective:  There were no vitals filed for this visit. Podiatric Exam: Vascular: dorsalis pedis and posterior tibial pulses are palpable bilateral. Capillary return is immediate. Temperature gradient is WNL. Skin turgor WNL  Sensorium: Normal Semmes Weinstein monofilament test. Normal tactile sensation bilaterally. Nail Exam: Pt has thick disfigured discolored nails with subungual debris noted bilateral entire nail hallux through fifth toenails.  Pain on palpation to the nails. Ulcer Exam: There is no evidence of ulcer or pre-ulcerative changes or infection. Orthopedic Exam: Muscle tone and strength are WNL. No limitations in general ROM. No crepitus or effusions noted.  Skin: No Porokeratosis. No infection or ulcers    Assessment & Plan:  No diagnosis found.  Patient was evaluated and treated and all questions answered.  Onychomycosis with pain  -Nails palliatively debrided as below. -Educated on self-care  Procedure: Nail Debridement Rationale: pain  Type of Debridement: manual, sharp debridement. Instrumentation: Nail nipper, rotary burr. Number of Nails: 10  Procedures and Treatment: Consent by patient was obtained for treatment procedures. The patient understood the discussion of treatment and procedures well. All questions were answered thoroughly reviewed. Debridement of mycotic and hypertrophic toenails, 1 through 5 bilateral and clearing of subungual debris. No ulceration, no infection noted.  Return  Visit-Office Procedure: Patient instructed to return to the office for a follow up visit 3 months for continued evaluation and treatment.  Nicholes Rough, DPM    No follow-ups on file.

## 2023-07-09 ENCOUNTER — Telehealth: Payer: Self-pay | Admitting: Nurse Practitioner

## 2023-07-09 NOTE — Telephone Encounter (Signed)
Patient called in and had some questions regarding her Semaglutide,0.25 or 0.5MG /DOS, (OZEMPIC, 0.25 OR 0.5 MG/DOSE,) 2 MG/3ML SOPN and medicare. She can be reached at [phone]. Thank you!

## 2023-07-09 NOTE — Telephone Encounter (Signed)
Contacted pt. Pt would like to be move up to the 1% dose of OZEMPIC, she says the topic was discussed at her last visit. Pt states that she has not lost any weight.   Pt also complains of not being able to afford all 6 boxes at once. Pt states that when she picks up one box it is only $11 vs all 6 total to $233.   Pt would like to be notified of dosage change before script is sent in to pharmacy so that she can make changes within her medicare insurance.

## 2023-07-10 ENCOUNTER — Other Ambulatory Visit (INDEPENDENT_AMBULATORY_CARE_PROVIDER_SITE_OTHER): Payer: Medicare Other

## 2023-07-10 DIAGNOSIS — E039 Hypothyroidism, unspecified: Secondary | ICD-10-CM | POA: Diagnosis not present

## 2023-07-10 MED ORDER — SEMAGLUTIDE (1 MG/DOSE) 4 MG/3ML ~~LOC~~ SOPN: 1.0000 mg | PEN_INJECTOR | SUBCUTANEOUS | 2 refills | Status: DC

## 2023-07-10 NOTE — Telephone Encounter (Signed)
We discussed her working on her diet in the office as she was doing a remodel. I am ok increasing the ozempic but it will lower her sugars. She needs to keep a close watch on her sugars as she may start experiencing low readings. I will write it for a month at a time as it is cheaper that way for her it seems

## 2023-07-10 NOTE — Addendum Note (Signed)
Addended by: Eden Emms on: 07/10/2023 12:58 PM   Modules accepted: Orders

## 2023-07-10 NOTE — Telephone Encounter (Signed)
Pt in office for lab visit. Spoke to pt and relayed information below provided by PCP.  Pt understood and verbalized understanding.

## 2023-07-11 LAB — TSH: TSH: 2.69 u[IU]/mL (ref 0.35–5.50)

## 2023-07-16 ENCOUNTER — Other Ambulatory Visit (HOSPITAL_COMMUNITY): Payer: Self-pay

## 2023-07-17 ENCOUNTER — Telehealth: Payer: Self-pay | Admitting: Nurse Practitioner

## 2023-07-17 NOTE — Telephone Encounter (Signed)
Received a fax from [facility] for [place] approval on Ozempic 1mg . Authorization #: [id number]. Authorization is for 07/16/2023 until further notice. Approved quantity: 3 per 28 days.

## 2023-07-24 ENCOUNTER — Other Ambulatory Visit: Payer: Self-pay | Admitting: Nurse Practitioner

## 2023-07-24 DIAGNOSIS — E1122 Type 2 diabetes mellitus with diabetic chronic kidney disease: Secondary | ICD-10-CM

## 2023-07-26 ENCOUNTER — Other Ambulatory Visit: Payer: Self-pay | Admitting: Nurse Practitioner

## 2023-08-12 ENCOUNTER — Ambulatory Visit: Payer: Medicare Other | Admitting: Nurse Practitioner

## 2023-08-12 ENCOUNTER — Ambulatory Visit (INDEPENDENT_AMBULATORY_CARE_PROVIDER_SITE_OTHER): Payer: Medicare Other | Admitting: Adult Health

## 2023-08-12 ENCOUNTER — Encounter: Payer: Self-pay | Admitting: Adult Health

## 2023-08-12 VITALS — BP 140/72 | HR 75 | Ht <= 58 in | Wt 166.0 lb

## 2023-08-12 DIAGNOSIS — G40909 Epilepsy, unspecified, not intractable, without status epilepticus: Secondary | ICD-10-CM | POA: Diagnosis not present

## 2023-08-12 DIAGNOSIS — R202 Paresthesia of skin: Secondary | ICD-10-CM

## 2023-08-12 DIAGNOSIS — I619 Nontraumatic intracerebral hemorrhage, unspecified: Secondary | ICD-10-CM

## 2023-08-12 MED ORDER — PREGABALIN 25 MG PO CAPS: 25.0000 mg | ORAL_CAPSULE | Freq: Two times a day (BID) | ORAL | 5 refills | Status: DC

## 2023-08-12 MED ORDER — PHENOBARBITAL 97.2 MG PO TABS: 97.2000 mg | ORAL_TABLET | Freq: Every day | ORAL | 5 refills | Status: DC

## 2023-08-12 NOTE — Progress Notes (Signed)
[facility] [address] [place]. [place] [address] [phone]       OFFICE FOLLOW UP NOTE  Ms. STEVY [name] Date of Birth:  [birth year] Medical Record Number:  [id number]    Primary neurologist: Dr. Pearlean [name] Reason for visit: stroke and seizure follow up    SUBJECTIVE:   CHIEF COMPLAINT:  Chief Complaint  Patient presents with   Follow-up    RM 3 with daughter [name] Pt is well, reports her L sided tingling and numbness has worsen since last visit. No new stroke or seizure concerns.     HPI:   Update 08/12/2023 Natalie [name]: Patient returns for 74-month follow-up accompanied by her daughter.  At prior visit with Aundra Millet, NP, complained of worsening left-sided dysesthesias, reported no benefit on gabapentin previously, discussed initiating pregabalin but patient wished to further consider.  Today, she reports gradual worsening of dysesthesias. Reports previously trialing pregabalin for prior foot injury but not for post stroke symptoms.  Previously tried duloxetine for depression but caused insomnia. Use of RW for short distance, w/c for long distance.  Denies new stroke/TIA symptoms. Denies any seizure activity, remains on phenobarbital without side effects.  Prior level satisfactory.  Denies any visual changes, no recent diplopia. Had repeat cataract surgery 6-8 months ago with improvement of vision.  Compliant on aspirin and atorvastatin.  Routinely follows with PCP for stroke risk factor management.      History provided for reference purposes only Update 02/05/2023 MM: Natalie [name] is a 72 y.o. female who has been followed in this office for stroke, seizures. Returns today for follow-up.  She is here today with her daughter.  She reports No seizures.  Reports increase numbness and tingling in the LUE and LLE. Tried Gabapentin in the past. Reports that it never worked.  She states that the left side of body also itches she has tried over-the-counter hydrocortisone cream  with no benefit.  She has never tried Lyrica.  Update 07/19/2022 Dr. Pearlean [name]: Patient returns for follow-up after last visit with Natalie [name] nurse practitioner 5 months ago.  He was asked to see me as he was seen in the ER on 06/07/2022 for sudden onset of diplopia.  He states that the double vision was binocular and more when she was looking to the left.  Was seen in the ER and was found to have mild eye pain as well.  CT scan of the head was obtained on 06/07/2022 which was negative for acute abnormality.  Patient was subsequently seen on ophthalmologist seems to be helping.  Denies any headaches, slurred speech, loss of vision no focal weakness or gait or balance problems.  He has had no other stroke or TIA symptoms. Phenobarbital for seizures but has not had seizures for more than 23 years.  Willing to consider reducing the dose of her phenobarbital   .for her migraines in the past she has used stadol nasal spray as needed but she nhas ran out of the prescription and her PCP is no longer willing to prescribe it..  Migraine headaches however appear quite infrequent not more than 2 times a month and over-the-counter medications like Tylenol also seem to work.  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  She is able to ambulate with a walker but states about 4.  She said no falls or injuries.  Also has history of migraines  Update 01/29/2022 Natalie [name]: Patient returns for 74-month stroke follow-up accompanied by her daughter.  Overall stable without new stroke/TIA symptoms.  Reports continued left-sided dysesthesias currently on gabapentin 300 mg 3 times daily. Does have some days where pain is worse. Unable to say if any improvement since starting gabapentin after prior visit but she did unfortunately suffer a fall approx 2 mo after resulting in left trimalleolar ankle fracture and femoral condyle fracture requiring open treatment for trimalleolar fracture on 12/29.  She is working with [facility] ortho currently doing PT. Continues to use RW, no additional falls.  Compliant on aspirin and atorvastatin, denies side effects.  Blood pressure today 146/74.  Routinely monitors at home, typically 130s.  She has since establish care with new PCP with completion of lab work today.  No new concerns at this time.  Update 07/27/2021 Natalie [name]: returns for 6 month stroke follow up accompanied by her daughter.  Overall stable without new stroke/TIA symptoms.  Reports continued left sided numbness/tingling. Left sided pain has been interfering with sleep, walking and any type of activity. At prior visit, declined interest in any medication management.  Also right foot numbness which she believes is from prior foot surgery.  She is currently using rolling walker due to worsening right foot pain.  She has had 3 falls since prior visit but thankfully without injury.  Compliant on aspirin and atorvastatin without side effects.  Blood pressure today 156/82.  Routinely monitors at home and typically stable.  Glucose levels stable per report.  Plans on obtaining lab work with PCP in the next week.  No further concerns at this time.   Update 01/25/2021 Natalie [name]: [name] returns for 74-month stroke follow-up accompanied by her daughter and infant great granddaughter  Stable since prior visit without new stroke/TIA symptoms.  Residual left sided numbness/tingling (thigh to foot and hand). Possibly improving but denies worsening.  Was seen by Dr. Wynn Banker 2/15 and placed on low-dose gabapentin but she self discontinued as she did not feel as though this was beneficial.  She declines interest in any further medication management She also continues to have gait instability but also recently underwent right foot hardware removal on 12/27/2020 without complication -she has been slowly recovering from this.  Reports compliance on aspirin 81 mg daily and atorvastatin without associated side effects Blood pressure  today 151/91 - monitors at home and typically lower Glucose levels stable at home per patient report She has not had any recent lab  No new concerns at this time  Initial visit 10/26/2020 Natalie [name]: Ms. Penado is being seen for hospital follow-up unaccompanied. She was discharged home from CIR on 09/15/2020 with recommendation of HH PT. She reports residual left foot numbness and gait impairment. She also reports right foot fracture s/p surgical procedure 08/2020 with planned hardware removal tomorrow but due to recent stroke, procedure delayed.  She believes this is also be contributing to her gait impairment.  Denies new or worsening stroke/TIA symptoms. She remains on atorvastatin 20 mg daily without myalgias. Blood pressure today 158/78 - monitors at home which typically stable . Glucose levels stable at home. Reports repeat A1c with PCP which was at 7.9.  No concerns at this time.  Stroke admission 09/02/2020 Ms. ABIGAILROSE NATALIE is a 72 y.o. female with history of chronic headaches chronic migraine, chronic numbness BLE, chronic pain syndrome, CKD, hypertension, hyperlipidemia, type 2 diabetes, and seizures since the age of 65, who presented to [facility] ED on 09/02/2020 with left sided weakness and mild headache. Personally reviewed hospitalization pertinent progress notes, lab work and imaging with  summary provided. Evaluated by Dr. Pearlean [name] with stroke work-up revealing acute right corona radiata IPH with mild surrounding edema and trace IVH, etiology likely hypertensive. History of HTN with long-term BP goal normotensive range. History of HLD on atorvastatin 40 mg daily with LDL 69 therefore decrease dosage to 20 mg daily in setting of recent IPH. History of DM with A1c 8.1 on insulin and glipizide. Other stroke risk factors include advanced age, obesity, migraines and prior strokes by imaging. Other active problems include CKD and seizure disorder on phenobarbital. Evaluated by therapies and discharged to CIR on  09/06/2020 for ongoing therapy needs and residual left-sided weakness and balance impairment.   Stroke: acute R corona radiata IPH with mild surrounding edema and trace IVH-etiology likely hypertensive CT head -  2 cc acute hematoma in the right corona radiata. There is early extension into the right lateral ventricle. Background chronic small vessel disease.  CT head repeat - Unchanged appearance of right basal ganglia intraparenchymal hemorrhage.  MRI head - Acute R corona radiata IPH with mild surrounding edema and trace IVH. No evidence of underlying lesion. Few chronic microhemorrhages. Mild to moderate chronic microvascular ischemic changes. Chronic small vessel infarcts CTA H&N -no large vessel stenosis or occlusion no aneurysm or AVM. 2D Echo - EF 60-65%. No source of embolus   Loyal Jacobson Virus 2 - negative LDL - 69 HgbA1c - 8.1 UDS - (not collected)  VTE prophylaxis - SCDs No antithrombotic prior to admission, now on No antithrombotic ICH Therapy recommendations:  CIR  Disposition:  CIR      ROS:   14 system review of systems performed and negative with exception of those listed in HPI  PMH:  Past Medical History:  Diagnosis Date   Arthritis    Atrophy of left kidney    Cervicalgia    Chronic kidney disease, stage 3 (HCC)    acute aki 03-14-2018 in setting pyelonehritis-- resolved   Chronic migraine    Chronic pain syndrome    History of kidney stones    History of pyelonephritis    hx recurrent   History of recurrent UTIs    History of sepsis    07/ 2017 secondary to pyelonephritis   Hypertension    Hypothyroidism    Iron deficiency anemia    Left ureteral stone    Mixed hyperlipidemia    Nephrolithiasis    right side nonobstructive per CT 03-14-2018   Right shoulder pain    post sx 02-18-2018   Seizure disorder (HCC) followed by pcp   per pt dx age 63--  no seizure since 1980's , controlled w/ phenobarbitol   Stroke (HCC) 08/2020   no deficits   Type 2  diabetes mellitus (HCC)    followed by pcp   Wears glasses     PSH:  Past Surgical History:  Procedure Laterality Date   CESAREAN SECTION  1974   CHOLECYSTECTOMY OPEN  2002   CYSTO/  LEFT RETROGRADE PYELOGRAM  06-11-2006   dr Annabell Howells  [facility]   CYSTOSCOPY WITH RETROGRADE PYELOGRAM, URETEROSCOPY AND STENT PLACEMENT Left 04/10/2018   Procedure: CYSTOSCOPY WITH RETROGRADE PYELOGRAM, URETEROSCOPY AND STENT PLACEMENT;  Surgeon: Marcine Matar, MD;  Location: [facility];  Service: Urology;  Laterality: Left;   EYE SURGERY Bilateral    cataract removal   FOOT ARTHRODESIS Right 09/10/2019   Procedure: ARTHRODESIS FOOT;  Surgeon: Terance Hart, MD;  Location: [facility];  Service: Orthopedics;  Laterality: Right;   HARDWARE  REMOVAL Right 12/27/2020   Procedure: DEEP ORTHOPEDIC HARDWARE REMOVAL, RIGHT FOOT;  Surgeon: Terance Hart, MD;  Location: [facility];  Service: Orthopedics;  Laterality: Right;   IR URETERAL STENT PLACEMENT EXISTING ACCESS RIGHT  08-21-2007    dr Vernie Ammons  [facility]   LEFT RETROGRADE PYELOGRAM/ LEFT URETERAL STENT PLACEMENT  02-25-2001   dr Annabell Howells [facility]   NEPHROLITHOTOMY Right 05/14/2016   Procedure: NEPHROLITHOTOMY PERCUTANEOUS;  Surgeon: Marcine Matar, MD;  Location: [facility];  Service: Urology;  Laterality: Right;   OPEN REDUCTION INTERNAL FIXATION (ORIF) FOOT LISFRANC FRACTURE Right 09/10/2019   Procedure: OPEN REDUCTION INTERNAL FIXATION (ORIF) FOOT LISFRANC FRACTURE;  Surgeon: Terance Hart, MD;  Location: [facility];  Service: Orthopedics;  Laterality: Right;   ORIF ANKLE FRACTURE Left 09/28/2021   Procedure: OPEN TREATMENT OF LEFT TRIMALLEOLAR FRACTURE WITHOUT POSTERIOR FIXATION, OPEN TREATMENT OF LEFT SYNDESMOSIS DISRUPTION;  Surgeon: Terance Hart, MD;  Location: [facility];  Service: Orthopedics;  Laterality: Left;   REPAIR EXTENSOR TENDON WITH METATARSAL OSTEOTOMY AND OPEN REDUCTION IN  Right 09/10/2019   Procedure: OPEN TREATMENT RIGHT FIRST, SECOND, THIRD TARSOMETATARSAL LISFRANC, MIDFOOT FUSION;  Surgeon: Terance Hart, MD;  Location: [facility];  Service: Orthopedics;  Laterality: Right;   SHOULDER ARTHROSCOPY Bilateral left 11-13-2000;  right 02-18-2018   TONSILLECTOMY      Social History:  Social History   Socioeconomic History   Marital status: Married    Spouse name: david   Number of children: 2   Years of education: 2 years of college   Highest education level: Not on file  Occupational History   Not on file  Tobacco Use   Smoking status: Never   Smokeless tobacco: Never  Vaping Use   Vaping status: Never Used  Substance and Sexual Activity   Alcohol use: No    Alcohol/week: 0.0 standard drinks of alcohol   Drug use: No   Sexual activity: Not Currently    Birth control/protection: Post-menopausal  Other Topics Concern   Not on file  Social History Narrative   Was a home maker       Armond Hang    Social Determinants of Health   Financial Resource Strain: Low Risk  (04/03/2023)   Overall Financial Resource Strain (CARDIA)    Difficulty of Paying Living Expenses: Not hard at all  Food Insecurity: No Food Insecurity (04/03/2023)   Hunger Vital Sign    Worried About Running Out of Food in the Last Year: Never true    Ran Out of Food in the Last Year: Never true  Transportation Needs: No Transportation Needs (04/03/2023)   PRAPARE - Administrator, Civil Service (Medical): No    Lack of Transportation (Non-Medical): No  Physical Activity: Inactive (04/03/2023)   Exercise Vital Sign    Days of Exercise per Week: 0 days    Minutes of Exercise per Session: 0 min  Stress: No Stress Concern Present (04/03/2023)   [employer] of Occupational Health - Occupational Stress Questionnaire    Feeling of Stress : Not at all  Social Connections: Moderately Integrated (04/03/2023)   Social Connection and Isolation Panel  [NHANES]    Frequency of Communication with Friends and Family: More than three times a week    Frequency of Social Gatherings with Friends and Family: Three times a week    Attends Religious Services: 1 to 4 times per year    Active Member of Clubs  or Organizations: No    Attends Banker Meetings: Never    Marital Status: Married  Catering manager Violence: Not At Risk (04/03/2023)   Humiliation, Afraid, Rape, and Kick questionnaire    Fear of Current or Ex-Partner: No    Emotionally Abused: No    Physically Abused: No    Sexually Abused: No    Family History:  Family History  Problem Relation Age of Onset   Heart disease Father    Diabetes Father    Migraines Maternal Grandmother    Seizures Maternal Grandmother     Medications:   Current Outpatient Medications on File Prior to Visit  Medication Sig Dispense Refill   amLODipine (NORVASC) 5 MG tablet TAKE 1 TABLET (5 MG TOTAL) BY MOUTH DAILY. 90 tablet 1   aspirin EC 81 MG tablet Take 4 tablets (325 mg total) by mouth daily. Swallow whole. 30 tablet 0   atorvastatin (LIPITOR) 80 MG tablet Take 80 mg by mouth daily.     BD INSULIN SYRINGE U/F 31G X 5/16" 0.3 ML MISC Use 1 syringe as directed twice a day     cetirizine (ZYRTEC) 10 MG tablet Take 1 tablet (10 mg total) by mouth daily. 30 tablet 11   glipiZIDE (GLUCOTROL) 10 MG tablet TAKE 1 TABLET (10 MG TOTAL) BY MOUTH TWICE A DAY BEFORE A MEAL 180 tablet 1   levothyroxine (SYNTHROID) 50 MCG tablet Take 1 tablet (50 mcg total) by mouth daily before breakfast. 90 tablet 0   losartan (COZAAR) 25 MG tablet Take 25 mg by mouth daily.     NOVOLIN N 100 UNIT/ML injection INJECT 0.2 MLS (20 UNITS TOTAL) INTO THE SKIN IN THE MORNING AND AT BEDTIME. 10 mL 1   PHENobarbital (LUMINAL) 97.2 MG tablet TAKE 1 TABLET BY MOUTH EVERY DAY 90 tablet 0   Semaglutide, 1 MG/DOSE, (OZEMPIC, 1 MG/DOSE,) 4 MG/3ML SOPN Inject into the skin once a week.     tiZANidine (ZANAFLEX) 4 MG tablet TAKE  1 TABLET BY MOUTH EVERY 4-6 HOURS AS NEEDED FOR MUSCLE SPASM (Patient taking differently: Take 4 mg by mouth as needed for muscle spasms.) 180 tablet 1   zolpidem (AMBIEN) 5 MG tablet TAKE 1 TABLET BY MOUTH AT BEDTIME FOR SLEEP IF NEEDED. 30 tablet 2   No current facility-administered medications on file prior to visit.    Allergies:   Allergies  Allergen Reactions   Compazine Other (See Comments)    Bad headache, vomiting; IV    Cymbalta [Duloxetine Hcl] Other (See Comments)    "Kept me awake"   Methadone Hives   Escitalopram Anxiety   Macrobid [Nitrofurantoin Monohyd Macro] Rash   Nitrofuran Derivatives Rash      OBJECTIVE:  Physical Exam  Vitals:   08/12/23 1439  BP: (!) 140/72  Pulse: 75  Weight: 166 lb (75.3 kg)  Height: 4\' 8"  (1.422 m)   Body mass index is 37.22 kg/m. No results found.  General: well developed, well nourished, very pleasant elderly Caucasian female, seated, in no evident distress  Neurologic Exam Mental Status: Awake and fully alert. Fluent speech and language. Oriented to place and time. Recent and remote memory intact. Attention span, concentration and fund of knowledge appropriate. Mood and affect appropriate.  Cranial Nerves: Pupils equal, briskly reactive to light. Extraocular movements full without nystagmus. Visual fields full to confrontation. Hearing intact. Facial sensation intact. Face, tongue, palate moves normally and symmetrically.  Motor: Normal bulk and tone. Normal strength in all tested  extremity muscles except difficulty fully testing LLE d/t pain Sensory.: reports altered light touch sensation LLE compared to RLE, intact BUE Coordination: Rapid alternating movements normal in all extremities. Finger-to-nose and heel-to-shin performed accurately bilaterally. Gait and Station: Deferred as RW not present Reflexes: 1+ and symmetric. Toes downgoing.       ASSESSMENT: Natalie [name] is a 72 y.o. year old female presented with  left-sided weakness and mild headache on 09/02/2020 with stroke work-up revealing acute right corona radiata IPH with mild surrounding edema and trace IVH, likely etiology hypertensive. Vascular risk factors include HTN, HLD, DM, obesity, migraines, seizure disorder and prior strokes on imaging.  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  Start pregabalin 25mg  twice daily.  No benefit on gabapentin.  Prior intolerance to duloxetine. 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  This included previsit chart review, lab review, study review, order entry, electronic health record documentation, patient education and discussion regarding above diagnoses and treatment plan and answered all other questions to patient and daughters satisfaction  Ihor Austin, [facility]  [facility] [address] [place], [place] [address]  Phone [phone] Fax  [phone] Note: This document was prepared with digital dictation and possible smart phrase technology. Any transcriptional errors that result from this process are unintentional.

## 2023-08-12 NOTE — Patient Instructions (Addendum)
Your Plan:  Recommend retrying pregablin 25mg  twice daily  Continue phenobarbital at current dose for seizure prevention   Continue to follow with your PCP for stroke risk factor management     Follow up in 6 months or call earlier if needed     Thank you for coming to see [place] at [facility]. I hope we have been able to provide you high quality care today.  You may receive a patient satisfaction survey over the next few weeks. We would appreciate your feedback and comments so that we may continue to improve ourselves and the health of our patients.

## 2023-08-14 ENCOUNTER — Ambulatory Visit: Payer: Medicare Other | Admitting: Nurse Practitioner

## 2023-08-15 ENCOUNTER — Ambulatory Visit: Payer: Medicare Other | Admitting: Nurse Practitioner

## 2023-08-15 ENCOUNTER — Encounter: Payer: Self-pay | Admitting: Nurse Practitioner

## 2023-08-15 VITALS — BP 130/82 | HR 80 | Temp 98.3°F | Ht <= 58 in | Wt 166.0 lb

## 2023-08-15 DIAGNOSIS — Z7985 Long-term (current) use of injectable non-insulin antidiabetic drugs: Secondary | ICD-10-CM

## 2023-08-15 DIAGNOSIS — I10 Essential (primary) hypertension: Secondary | ICD-10-CM

## 2023-08-15 DIAGNOSIS — E1142 Type 2 diabetes mellitus with diabetic polyneuropathy: Secondary | ICD-10-CM | POA: Diagnosis not present

## 2023-08-15 LAB — POCT GLYCOSYLATED HEMOGLOBIN (HGB A1C): Hemoglobin A1C: 8.1 % — AB (ref 4.0–5.6)

## 2023-08-15 MED ORDER — LOSARTAN POTASSIUM 25 MG PO TABS: 25.0000 mg | ORAL_TABLET | Freq: Every day | ORAL | 2 refills | Status: DC

## 2023-08-15 MED ORDER — FREESTYLE LIBRE 3 READER DEVI: 1.0000 | 0 refills | Status: AC

## 2023-08-15 MED ORDER — ATORVASTATIN CALCIUM 80 MG PO TABS: 80.0000 mg | ORAL_TABLET | Freq: Every day | ORAL | 2 refills | Status: DC

## 2023-08-15 MED ORDER — FREESTYLE LIBRE 3 SENSOR MISC: 1.0000 | 2 refills | Status: AC

## 2023-08-15 NOTE — Patient Instructions (Signed)
Nice to see you today I have sent in a continuous glucose monitor. Let me know if it is too expensive Follow up with me in 3 months, sooner if you need me

## 2023-08-15 NOTE — Assessment & Plan Note (Signed)
Patient blood pressure well-controlled tolerating medication well no side effects per patient report continue medication as prescribed

## 2023-08-15 NOTE — Assessment & Plan Note (Signed)
Patient currently maintained on Novolin 15 units twice daily along with glipizide 10 mg twice daily and Ozempic 1 mg weekly.  Patient is been on Ozempic 1 mg weekly for approximately 1 month A1c stable we will keep medications the same for now.  Patient continue working on lifestyle modifications follow-up 3 months for A1c recheck

## 2023-08-15 NOTE — Progress Notes (Signed)
New Patient Office Visit  Subjective    Patient ID: Natalie [name], female    DOB: 1950/10/26  Age: 72 y.o. MRN: [id number]  CC:  Chief Complaint  Patient presents with   Medical Management of Chronic Issues    Needs referral to get feet measured    HPI Natalie [name] presents to establish care   DM2: Patient currently maintained on Ozempic 0.5 mg weekly, glipizide 10 mg twice daily, NPH 20 units twice daily patient was to focus on lifestyle modifications.  Do not change any medication at last office visit.  Patient's previous A1c was 8.1%  States that she is checking it three times a week and has been getting 91-226.   Outpatient Encounter Medications as of 08/15/2023  Medication Sig   amLODipine (NORVASC) 5 MG tablet TAKE 1 TABLET (5 MG TOTAL) BY MOUTH DAILY.   aspirin EC 81 MG tablet Take 4 tablets (325 mg total) by mouth daily. Swallow whole.   BD INSULIN SYRINGE U/F 31G X 5/16" 0.3 ML MISC Use 1 syringe as directed twice a day   cetirizine (ZYRTEC) 10 MG tablet Take 1 tablet (10 mg total) by mouth daily.   Continuous Glucose Receiver (FREESTYLE LIBRE 3 READER) DEVI 1 each by Does not apply route as directed.   Continuous Glucose Sensor (FREESTYLE LIBRE 3 SENSOR) MISC 1 each by Does not apply route as directed.   glipiZIDE (GLUCOTROL) 10 MG tablet TAKE 1 TABLET (10 MG TOTAL) BY MOUTH TWICE A DAY BEFORE A MEAL   levothyroxine (SYNTHROID) 50 MCG tablet Take 1 tablet (50 mcg total) by mouth daily before breakfast.   NOVOLIN N 100 UNIT/ML injection INJECT 0.2 MLS (20 UNITS TOTAL) INTO THE SKIN IN THE MORNING AND AT BEDTIME.   PHENobarbital (LUMINAL) 97.2 MG tablet Take 1 tablet (97.2 mg total) by mouth at bedtime.   pregabalin (LYRICA) 25 MG capsule Take 1 capsule (25 mg total) by mouth 2 (two) times daily.   Semaglutide, 1 MG/DOSE, (OZEMPIC, 1 MG/DOSE,) 4 MG/3ML SOPN Inject 1 mg into the skin once a week.   tiZANidine (ZANAFLEX) 4 MG tablet TAKE 1 TABLET BY MOUTH EVERY 4-6 HOURS  AS NEEDED FOR MUSCLE SPASM (Patient taking differently: Take 4 mg by mouth as needed for muscle spasms.)   zolpidem (AMBIEN) 5 MG tablet TAKE 1 TABLET BY MOUTH AT BEDTIME FOR SLEEP IF NEEDED.   [DISCONTINUED] atorvastatin (LIPITOR) 80 MG tablet Take 80 mg by mouth daily.   [DISCONTINUED] losartan (COZAAR) 25 MG tablet Take 25 mg by mouth daily.   atorvastatin (LIPITOR) 80 MG tablet Take 1 tablet (80 mg total) by mouth daily.   losartan (COZAAR) 25 MG tablet Take 1 tablet (25 mg total) by mouth daily.   No facility-administered encounter medications on file as of 08/15/2023.    Past Medical History:  Diagnosis Date   Arthritis    Atrophy of left kidney    Cervicalgia    Chronic kidney disease, stage 3 (HCC)    acute aki 03-14-2018 in setting pyelonehritis-- resolved   Chronic migraine    Chronic pain syndrome    History of kidney stones    History of pyelonephritis    hx recurrent   History of recurrent UTIs    History of sepsis    07/ 2017 secondary to pyelonephritis   Hypertension    Hypothyroidism    Iron deficiency anemia    Left ureteral stone    Mixed hyperlipidemia    Nephrolithiasis  right side nonobstructive per CT 03-14-2018   Right shoulder pain    post sx 02-18-2018   Seizure disorder (HCC) followed by pcp   per pt dx age 33--  no seizure since 1980's , controlled w/ phenobarbitol   Stroke (HCC) 08/2020   no deficits   Type 2 diabetes mellitus (HCC)    followed by pcp   Wears glasses     Past Surgical History:  Procedure Laterality Date   CESAREAN SECTION  1974   CHOLECYSTECTOMY OPEN  2002   CYSTO/  LEFT RETROGRADE PYELOGRAM  06-11-2006   dr Annabell Howells  [facility]   CYSTOSCOPY WITH RETROGRADE PYELOGRAM, URETEROSCOPY AND STENT PLACEMENT Left 04/10/2018   Procedure: CYSTOSCOPY WITH RETROGRADE PYELOGRAM, URETEROSCOPY AND STENT PLACEMENT;  Surgeon: Marcine Matar, MD;  Location: [facility];  Service: Urology;  Laterality: Left;   EYE SURGERY  Bilateral    cataract removal   FOOT ARTHRODESIS Right 09/10/2019   Procedure: ARTHRODESIS FOOT;  Surgeon: Terance Hart, MD;  Location: [facility];  Service: Orthopedics;  Laterality: Right;   HARDWARE REMOVAL Right 12/27/2020   Procedure: DEEP ORTHOPEDIC HARDWARE REMOVAL, RIGHT FOOT;  Surgeon: Terance Hart, MD;  Location: [facility];  Service: Orthopedics;  Laterality: Right;   IR URETERAL STENT PLACEMENT EXISTING ACCESS RIGHT  08-21-2007    dr Vernie Ammons  [facility]   LEFT RETROGRADE PYELOGRAM/ LEFT URETERAL STENT PLACEMENT  02-25-2001   dr Annabell Howells [facility]   NEPHROLITHOTOMY Right 05/14/2016   Procedure: NEPHROLITHOTOMY PERCUTANEOUS;  Surgeon: Marcine Matar, MD;  Location: [facility];  Service: Urology;  Laterality: Right;   OPEN REDUCTION INTERNAL FIXATION (ORIF) FOOT LISFRANC FRACTURE Right 09/10/2019   Procedure: OPEN REDUCTION INTERNAL FIXATION (ORIF) FOOT LISFRANC FRACTURE;  Surgeon: Terance Hart, MD;  Location: [facility];  Service: Orthopedics;  Laterality: Right;   ORIF ANKLE FRACTURE Left 09/28/2021   Procedure: OPEN TREATMENT OF LEFT TRIMALLEOLAR FRACTURE WITHOUT POSTERIOR FIXATION, OPEN TREATMENT OF LEFT SYNDESMOSIS DISRUPTION;  Surgeon: Terance Hart, MD;  Location: [facility];  Service: Orthopedics;  Laterality: Left;   REPAIR EXTENSOR TENDON WITH METATARSAL OSTEOTOMY AND OPEN REDUCTION IN Right 09/10/2019   Procedure: OPEN TREATMENT RIGHT FIRST, SECOND, THIRD TARSOMETATARSAL LISFRANC, MIDFOOT FUSION;  Surgeon: Terance Hart, MD;  Location: [facility];  Service: Orthopedics;  Laterality: Right;   SHOULDER ARTHROSCOPY Bilateral left 11-13-2000;  right 02-18-2018   TONSILLECTOMY      Family History  Problem Relation Age of Onset   Heart disease Father    Diabetes Father    Migraines Maternal Grandmother    Seizures Maternal Grandmother     Social History   Socioeconomic History   Marital status:  Married    Spouse name: david   Number of children: 2   Years of education: 2 years of college   Highest education level: Not on file  Occupational History   Not on file  Tobacco Use   Smoking status: Never   Smokeless tobacco: Never  Vaping Use   Vaping status: Never Used  Substance and Sexual Activity   Alcohol use: No    Alcohol/week: 0.0 standard drinks of alcohol   Drug use: No   Sexual activity: Not Currently    Birth control/protection: Post-menopausal  Other Topics Concern   Not on file  Social History Narrative   Was a home maker       Armond Hang    Social Determinants of Health  Financial Resource Strain: Low Risk  (04/03/2023)   Overall Financial Resource Strain (CARDIA)    Difficulty of Paying Living Expenses: Not hard at all  Food Insecurity: No Food Insecurity (04/03/2023)   Hunger Vital Sign    Worried About Running Out of Food in the Last Year: Never true    Ran Out of Food in the Last Year: Never true  Transportation Needs: No Transportation Needs (04/03/2023)   PRAPARE - Administrator, Civil Service (Medical): No    Lack of Transportation (Non-Medical): No  Physical Activity: Inactive (04/03/2023)   Exercise Vital Sign    Days of Exercise per Week: 0 days    Minutes of Exercise per Session: 0 min  Stress: No Stress Concern Present (04/03/2023)   [employer] of Occupational Health - Occupational Stress Questionnaire    Feeling of Stress : Not at all  Social Connections: Moderately Integrated (04/03/2023)   Social Connection and Isolation Panel [NHANES]    Frequency of Communication with Friends and Family: More than three times a week    Frequency of Social Gatherings with Friends and Family: Three times a week    Attends Religious Services: 1 to 4 times per year    Active Member of Clubs or Organizations: No    Attends Banker Meetings: Never    Marital Status: Married  Catering manager Violence: Not At Risk (04/03/2023)    Humiliation, Afraid, Rape, and Kick questionnaire    Fear of Current or Ex-Partner: No    Emotionally Abused: No    Physically Abused: No    Sexually Abused: No    Review of Systems  Constitutional:  Negative for chills and fever.  Respiratory:  Negative for shortness of breath.   Cardiovascular:  Negative for chest pain.  Neurological:  Negative for headaches.        Objective    BP 130/82   Pulse 80   Temp 98.3 F (36.8 C)   Ht 4\' 8"  (1.422 m)   Wt 166 lb (75.3 kg)   SpO2 96%   BMI 37.22 kg/m   Wt Readings from Last 3 Encounters:  08/15/23 166 lb (75.3 kg)  08/12/23 166 lb (75.3 kg)  05/10/23 164 lb (74.4 kg)    Physical Exam Vitals and nursing note reviewed.  Constitutional:      Appearance: Normal appearance.  Cardiovascular:     Rate and Rhythm: Normal rate and regular rhythm.     Heart sounds: Normal heart sounds.  Pulmonary:     Effort: Pulmonary effort is normal.     Breath sounds: Normal breath sounds.  Neurological:     Mental Status: She is alert.         Assessment & Plan:   Problem List Items Addressed This Visit       Cardiovascular and Mediastinum   HTN (hypertension)    Patient blood pressure well-controlled tolerating medication well no side effects per patient report continue medication as prescribed      Relevant Medications   atorvastatin (LIPITOR) 80 MG tablet   losartan (COZAAR) 25 MG tablet     Endocrine   Diabetes mellitus, type II (HCC) - Primary    Patient currently maintained on Novolin 15 units twice daily along with glipizide 10 mg twice daily and Ozempic 1 mg weekly.  Patient is been on Ozempic 1 mg weekly for approximately 1 month A1c stable we will keep medications the same for now.  Patient continue working on  lifestyle modifications follow-up 3 months for A1c recheck      Relevant Medications   atorvastatin (LIPITOR) 80 MG tablet   losartan (COZAAR) 25 MG tablet   Continuous Glucose Receiver (FREESTYLE LIBRE 3  READER) DEVI   Continuous Glucose Sensor (FREESTYLE LIBRE 3 SENSOR) MISC   Other Relevant Orders   POCT glycosylated hemoglobin (Hb A1C) (Completed)   AMB Referral [facility] Care Management    Return in about 3 months (around 11/15/2023) for CPE and Labs.   Audria Nine, NP

## 2023-08-16 ENCOUNTER — Telehealth: Payer: Self-pay

## 2023-08-16 NOTE — Progress Notes (Signed)
   Care Guide Note  08/16/2023 Name: Natalie [name] MRN: [id number] DOB: [birth year]  Referred by: Eden Emms, NP Reason for referral : Care Coordination (Outreach to schedule with Pharm d )   Natalie [name] is a 72 y.o. year old female who is a primary care patient of Toney Reil, Genene Churn, NP. Natalie [name] was referred to the pharmacist for assistance related to DM.    Successful contact was made with the patient to discuss pharmacy services including being ready for the pharmacist to call at least 5 minutes before the scheduled appointment time, to have medication bottles and any blood sugar or blood pressure readings ready for review. The patient agreed to meet with the pharmacist via with the pharmacist via telephone visit on (date/time).  08/21/2023  Natalie [name], RMA Care Guide [facility]  [place], [place] [address] Direct Dial: [phone] [name]

## 2023-08-21 ENCOUNTER — Other Ambulatory Visit: Payer: Medicare Other

## 2023-08-21 NOTE — Patient Instructions (Signed)
[name],   It was a pleasure to speak with you today! As we discussed:?   Today we started an application for the following program:  [employer] Patient [employer] [facility]) Medication: Ozempic 1.0 mg injection [employer] [address], [place], [place] [address]  Phone: [phone] M-F 8am-8pm ET    Fax: [phone]   For any questions of concerns regarding your application status, or once approved, please contact the program by phone.  Please reach out prior to your next scheduled appointment should you have any questions or concerns.  Thank you!   Future Appointments  Date Time Provider Department Center  11/15/2023  2:20 PM Eden Emms, NP [facility]  02/18/2024  2:45 PM Ihor Austin, NP [facility] None  04/06/2024  1:00 PM [facility] ANNUAL WELLNESS VISIT 1 [facility]   Loree Fee, PharmD Clinical Pharmacist [facility] [phone]

## 2023-08-21 NOTE — Progress Notes (Signed)
   08/21/2023 Name: Natalie [name] MRN: [id number] DOB: 14-Mar-1951  Subjective  Chief Complaint  Patient presents with   Medication Access    Reason for visit: Natalie [name] is a 72 y.o. year old female who presented for a telephone visit.   They were referred to the pharmacist by their PCP for assistance in managing medication access.   Care Team: Primary Care Provider: Eden Emms, NP  Reason for visit: ?  Natalie [name] is a 72 y.o. female who presents today for a phone visit with the pharmacist due to medication access concerns regarding their Ozempic. ?   Medication Access: ?  Prescription drug coverage: Payor: MEDICARE / Plan: MEDICARE PART A AND B / Product Type: *No Product type* / .   Reports that all medications are not affordable.  Copay has increased since entering the Coverage Gap (Donut hole). >$200  Current Patient Assistance:  None  Patient lives in a household of 2 with her husband. Husband is still working, patient receives only SSI.   Medicare LIS Eligible: No  Couples Asset Limit: $34,360 ; Exceeds   Assessment and Plan:  1. Medication Access. Medicatin cost has increased likely 2/2 Medicare Coverage Gap.  Based on [employer] tool, patient may be eligible for Novo MAP program.  [employer] application filled out today with patient on the phone, and submitted electronically. Uploaded to Media tab, Provider page completed and placed in PCP inbox for signature.  Prescriptions requested: Ozempic 1.0 mg injection.  NovoFine pen needle Msgd MD regarding insulin which would also be available. May be good opportunity to transition off Novolin N vials though this is also offered through Novo.  Patient is not eligible for copay cards due to government insurance. Will cc [facility] team for any future correspondence/re-enrollment.   Future Appointments  Date Time Provider Department Center  11/15/2023  2:20 PM Eden Emms, NP [facility]  02/18/2024  2:45 PM Ihor Austin, NP [facility] None  04/06/2024  1:00 PM [facility] ANNUAL WELLNESS VISIT 1 [facility]   Loree Fee, PharmD Clinical Pharmacist [facility] [phone]

## 2023-08-26 ENCOUNTER — Other Ambulatory Visit: Payer: Self-pay | Admitting: Nurse Practitioner

## 2023-08-26 DIAGNOSIS — E039 Hypothyroidism, unspecified: Secondary | ICD-10-CM

## 2023-09-16 ENCOUNTER — Other Ambulatory Visit: Payer: Self-pay | Admitting: Nurse Practitioner

## 2023-09-16 DIAGNOSIS — G4700 Insomnia, unspecified: Secondary | ICD-10-CM

## 2023-09-16 NOTE — Telephone Encounter (Signed)
LAST APPOINTMENT DATE: 08/26/2023 DM f/u    NEXT APPOINTMENT DATE: 11/15/2023 f/u     LAST REFILL:06/24/23   QTY: #30 2 rf

## 2023-10-10 ENCOUNTER — Other Ambulatory Visit: Payer: Self-pay | Admitting: Nurse Practitioner

## 2023-10-14 ENCOUNTER — Other Ambulatory Visit: Payer: Self-pay | Admitting: Nurse Practitioner

## 2023-10-25 ENCOUNTER — Telehealth: Payer: Self-pay | Admitting: *Deleted

## 2023-10-25 NOTE — Telephone Encounter (Signed)
Received 4 boxes of Ozempic Lot # [id number] Exp date. 04/30/26 NDC [id number]  Called pt and advised med is ready for pick up

## 2023-11-15 ENCOUNTER — Other Ambulatory Visit: Payer: Self-pay | Admitting: Nurse Practitioner

## 2023-11-15 ENCOUNTER — Encounter: Payer: Medicare Other | Admitting: Nurse Practitioner

## 2023-11-15 DIAGNOSIS — E039 Hypothyroidism, unspecified: Secondary | ICD-10-CM

## 2023-11-15 DIAGNOSIS — G4700 Insomnia, unspecified: Secondary | ICD-10-CM

## 2023-11-15 NOTE — Telephone Encounter (Signed)
Copied from CRM [id number]. Topic: Clinical - Medication Refill >> Nov 15, 2023  4:12 PM Eunice Blase wrote: Most Recent Primary Care Visit:  Provider: Loree Fee  Department: [facility]  Visit Type: PATIENT OUTREACH 60  Date: 08/21/2023  Medication: zolpidem (AMBIEN) 5 MG tablet  Has the patient contacted their pharmacy? Yes (Agent: If no, request that the patient contact the pharmacy for the refill. If patient does not wish to contact the pharmacy document the reason why and proceed with request.) (Agent: If yes, when and what did the pharmacy advise?)  Is this the correct pharmacy for this prescription? Yes If no, delete pharmacy and type the correct one.  This is the patient's preferred pharmacy:  [facility] [phone] [facility] [place] - [address] [place] [address] Phone: [phone] Fax: [phone]    Has the prescription been filled recently? Yes  Is the patient out of the medication? Yes  Has the patient been seen for an appointment in the last year OR does the patient have an upcoming appointment? Yes  Can we respond through MyChart? Yes  Agent: Please be advised that Rx refills may take up to 3 business days. We ask that you follow-up with your pharmacy.

## 2023-11-15 NOTE — Telephone Encounter (Signed)
Last Fill: 09/16/23  Last OV: 08/15/23 Next OV: 01/31/24  Routing to provider for review/authorization.

## 2023-11-15 NOTE — Telephone Encounter (Signed)
Copied from CRM [id number]. Topic: Clinical - Medication Refill >> Nov 15, 2023  4:06 PM Eunice Blase wrote: Most Recent Primary Care Visit:  Provider: Loree Fee  Department: [facility]  Visit Type: PATIENT OUTREACH 60  Date: 08/21/2023  Medication: levothyroxine (SYNTHROID) 50 MCG tablet  Has the patient contacted their pharmacy? Yes (Agent: If no, request that the patient contact the pharmacy for the refill. If patient does not wish to contact the pharmacy document the reason why and proceed with request.) (Agent: If yes, when and what did the pharmacy advise?)  Is this the correct pharmacy for this prescription? Yes If no, delete pharmacy and type the correct one.  This is the patient's preferred pharmacy:  [phone] [place] - [address] [place] [address] Phone: [phone] Fax: [phone]     Has the prescription been filled recently? Yes  Is the patient out of the medication? Yes  Has the patient been seen for an appointment in the last year OR does the patient have an upcoming appointment? Yes  Can we respond through MyChart? Yes  Agent: Please be advised that Rx refills may take up to 3 business days. We ask that you follow-up with your pharmacy.

## 2023-11-18 ENCOUNTER — Telehealth: Payer: Self-pay

## 2023-11-18 MED ORDER — ZOLPIDEM TARTRATE 5 MG PO TABS: ORAL_TABLET | ORAL | 2 refills | Status: DC

## 2023-11-18 NOTE — Telephone Encounter (Signed)
She will need to speak with the pharmacy about that. I sent in 6 month supply in 08/2023.

## 2023-11-18 NOTE — Telephone Encounter (Signed)
Copied from CRM [id number]. Topic: Clinical - Prescription Issue >> Nov 18, 2023 11:58 AM Hector Shade B wrote: Reason for CRM: Patient stated that she's had some issues with having her levothyroxine filled when her daughter went to pick up the medication and the meds were filled in her daughter's name and not hers [phone].

## 2023-11-19 NOTE — Telephone Encounter (Signed)
Spoke to pt. She said there was a mix-up with it and they filled it under her daughter and not her. But supposedly, they are refilling it today for her.

## 2023-12-07 ENCOUNTER — Other Ambulatory Visit: Payer: Self-pay | Admitting: Nurse Practitioner

## 2023-12-15 ENCOUNTER — Other Ambulatory Visit: Payer: Self-pay | Admitting: Nurse Practitioner

## 2023-12-15 DIAGNOSIS — G4700 Insomnia, unspecified: Secondary | ICD-10-CM

## 2023-12-30 ENCOUNTER — Other Ambulatory Visit: Payer: Self-pay | Admitting: Nurse Practitioner

## 2023-12-30 DIAGNOSIS — E1122 Type 2 diabetes mellitus with diabetic chronic kidney disease: Secondary | ICD-10-CM

## 2024-01-29 ENCOUNTER — Telehealth: Payer: Self-pay

## 2024-01-29 NOTE — Telephone Encounter (Signed)
 Contacted pt to let her know that the 4 boxes of Ozempic  are in office for pick up.  Pt verbalized understanding and stated that she will stop by.

## 2024-01-31 ENCOUNTER — Ambulatory Visit (INDEPENDENT_AMBULATORY_CARE_PROVIDER_SITE_OTHER): Payer: Medicare Other | Admitting: Nurse Practitioner

## 2024-01-31 ENCOUNTER — Other Ambulatory Visit: Payer: Self-pay | Admitting: Nurse Practitioner

## 2024-01-31 ENCOUNTER — Encounter: Payer: Self-pay | Admitting: Nurse Practitioner

## 2024-01-31 VITALS — BP 102/60 | HR 75 | Temp 97.9°F | Ht <= 58 in | Wt 159.6 lb

## 2024-01-31 DIAGNOSIS — I10 Essential (primary) hypertension: Secondary | ICD-10-CM | POA: Diagnosis not present

## 2024-01-31 DIAGNOSIS — E1122 Type 2 diabetes mellitus with diabetic chronic kidney disease: Secondary | ICD-10-CM

## 2024-01-31 DIAGNOSIS — G43711 Chronic migraine without aura, intractable, with status migrainosus: Secondary | ICD-10-CM | POA: Diagnosis not present

## 2024-01-31 DIAGNOSIS — Z7985 Long-term (current) use of injectable non-insulin antidiabetic drugs: Secondary | ICD-10-CM

## 2024-01-31 DIAGNOSIS — Z794 Long term (current) use of insulin: Secondary | ICD-10-CM

## 2024-01-31 DIAGNOSIS — M79671 Pain in right foot: Secondary | ICD-10-CM | POA: Insufficient documentation

## 2024-01-31 DIAGNOSIS — M79672 Pain in left foot: Secondary | ICD-10-CM

## 2024-01-31 DIAGNOSIS — G4700 Insomnia, unspecified: Secondary | ICD-10-CM

## 2024-01-31 DIAGNOSIS — G40909 Epilepsy, unspecified, not intractable, without status epilepticus: Secondary | ICD-10-CM | POA: Diagnosis not present

## 2024-01-31 DIAGNOSIS — E039 Hypothyroidism, unspecified: Secondary | ICD-10-CM

## 2024-01-31 DIAGNOSIS — Z7984 Long term (current) use of oral hypoglycemic drugs: Secondary | ICD-10-CM

## 2024-01-31 NOTE — Patient Instructions (Signed)
 Nice to see you today I will be in touch with the labs once I have it Follow up win 3 months, sooner If you need me

## 2024-01-31 NOTE — Assessment & Plan Note (Signed)
 History of same.  Patient uses Ambien  5 mg nightly.  Continue

## 2024-01-31 NOTE — Assessment & Plan Note (Signed)
 Patient currently maintained on Novolin 20 units twice daily, glipizide  10 mg twice daily, Ozempic  1 mg weekly.  A1c well-controlled at 6.4% today.  Patient Toller medication well.  States she was not able to get a CGM.  Patient was given a sample of freestyle libre 3.  Patient does have supplies at home to check her glucose albeit she does not quite infrequently.  Continue medication as prescribed

## 2024-01-31 NOTE — Assessment & Plan Note (Signed)
 Patient is followed by neurology on phenobarbital .  Continue taking medication as prescribed continue following with neurology as recommended

## 2024-01-31 NOTE — Telephone Encounter (Signed)
 Patient picked up medication

## 2024-01-31 NOTE — Assessment & Plan Note (Signed)
 Patient currently maintained on levothyroxine  50 mcg daily.  Pending TSH today

## 2024-01-31 NOTE — Assessment & Plan Note (Signed)
 History of the same.  Patient is followed by neurology she is able to take OTC medicines to help

## 2024-01-31 NOTE — Progress Notes (Signed)
 Established Patient Office Visit  Subjective   Patient ID: Natalie [name], female    DOB: Dec 24, 1950  Age: 73 y.o. MRN: [id number]  Chief Complaint  Patient presents with   Annual Exam    Pt complains of need for new referral to a different podiatrist. Pt has not seen diabetic eye doctor in over year. States she will make appt.     HPI  HTN: Patient currently maintained on amlodipine  5 mg daily, losartan  25 mg daily. Sometimes checks it at home  DM2: Patient currently maintained on Glipizide  10 mg twice daily, Novolin 20 units twice daily, Ozempic  1 mg weekly.  Patient does states that she never got it. She is checking it once a month.    Hypothyroid: Patient currently maintained on Levothyroxine  50 mcg daily  Seizures: Patient currently followed by neurology and maintained on Phenobarbital  and pregabalin   Insomnia: Patient currently maintained on Ambien  5 mg nightly. Goes tobed [phone] and will get up around 830. Feels rested   for complete physical and follow up of chronic conditions.  Immunizations: -Tetanus: Completed in 2015, get local pharmacy -Influenza: Out of season -Shingles: Completed Shingrix series at least 1 vaccine -Pneumonia: Completed   Diet: Fair diet. She is eating 2 meals a day. She is not snacking. She is drink coffee and water.  Exercise: No regular exercise.  Eye exam: Over due Dental exam: Needs updating     Colonoscopy: Completed in 2022 with no repest  Lung Cancer Screening: N/A  Pap smear: Aged out  Mammogram: can't stand up and does not want to do  Dexa: Needs updating patient will think on this and let me know if she wants to have it done  Advance directive: states that she is working on it with her lawyer she has appointment next week        Review of Systems  Constitutional:  Negative for chills and fever.  Respiratory:  Negative for shortness of breath.   Cardiovascular:  Negative for chest pain and leg swelling.   Gastrointestinal:  Negative for abdominal pain, blood in stool, constipation, diarrhea, nausea and vomiting.       Bm daily   Genitourinary:  Negative for dysuria and hematuria.  Neurological:  Negative for tingling and headaches.  Psychiatric/Behavioral:  Negative for hallucinations and suicidal ideas.       Objective:     BP 102/60   Pulse 75   Temp 97.9 F (36.6 C) (Oral)   Ht 4\' 8"  (1.422 m)   Wt 159 lb 9.6 oz (72.4 kg)   SpO2 95%   BMI 35.78 kg/m  BP Readings from Last 3 Encounters:  01/31/24 102/60  08/15/23 130/82  08/12/23 (!) 140/72   Wt Readings from Last 3 Encounters:  01/31/24 159 lb 9.6 oz (72.4 kg)  08/15/23 166 lb (75.3 kg)  08/12/23 166 lb (75.3 kg)   SpO2 Readings from Last 3 Encounters:  01/31/24 95%  08/15/23 96%  05/10/23 94%      Physical Exam Vitals and nursing note reviewed.  Constitutional:      Appearance: Normal appearance.  HENT:     Right Ear: Tympanic membrane, ear canal and external ear normal.     Left Ear: Tympanic membrane, ear canal and external ear normal.     Mouth/Throat:     Mouth: Mucous membranes are moist.     Pharynx: Oropharynx is clear.  Eyes:     Extraocular Movements: Extraocular movements intact.  Pupils: Pupils are equal, round, and reactive to light.  Cardiovascular:     Rate and Rhythm: Normal rate and regular rhythm.     Pulses: Normal pulses.     Heart sounds: Normal heart sounds.  Pulmonary:     Effort: Pulmonary effort is normal.     Breath sounds: Normal breath sounds.  Abdominal:     General: Bowel sounds are normal. There is no distension.     Palpations: There is no mass.     Tenderness: There is no abdominal tenderness.     Hernia: No hernia is present.  Musculoskeletal:     Right lower leg: No edema.     Left lower leg: No edema.  Lymphadenopathy:     Cervical: No cervical adenopathy.  Skin:    General: Skin is warm.  Neurological:     General: No focal deficit present.     Mental  Status: She is alert.     Deep Tendon Reflexes:     Reflex Scores:      Bicep reflexes are 2+ on the right side and 2+ on the left side.      Patellar reflexes are 0 on the right side and 0 on the left side.    Comments: Bilateral upper and lower extremity strength 5/5  Psychiatric:        Mood and Affect: Mood normal.        Behavior: Behavior normal.        Thought Content: Thought content normal.        Judgment: Judgment normal.    Title   Diabetic Foot Exam - detailed Is there a history of foot ulcer?: No Is there a foot ulcer now?: No Is there swelling?: No Is there elevated skin temperature?: No Is there abnormal foot shape?: No Is there a claw toe deformity?: No Are the toenails long?: No Are the toenails thick?: No Are the toenails ingrown?: No Pulse Foot Exam completed.: Yes   Right Posterior Tibialis: Present Left posterior Tibialis: Present   Right Dorsalis Pedis: Present Left Dorsalis Pedis: Present     Semmes-Weinstein Monofilament Test "+" means "has sensation" and "-" means "no sensation"      Image components are not supported.   Image components are not supported. Image components are not supported.  [place] Comments Decreased 10, 1, 2, 3, 7, 8, 9 bilateral        No results found for any visits on 01/31/24.    The ASCVD Risk score (Arnett DK, et al., 2019) failed to calculate for the following reasons:   Risk score cannot be calculated because patient has a medical history suggesting prior/existing ASCVD    Assessment & Plan:   Problem List Items Addressed This Visit       Cardiovascular and Mediastinum   HTN (hypertension) - Primary   Patient currently maintained on amlodipine  5 mg daily and losartan  25 mg daily.  Blood pressure well-controlled.  Patient tolerates medication well continue medication as prescribed      Relevant Orders   TSH   Microalbumin / creatinine urine ratio   Lipid panel   Comprehensive metabolic panel  with GFR   CBC   Migraine, chronic, without aura, intractable, with status migrainosus   History of the same.  Patient is followed by neurology she is able to take OTC medicines to help        Endocrine   Diabetes mellitus, type II [facility])   Patient currently maintained  on Novolin 20 units twice daily, glipizide  10 mg twice daily, Ozempic  1 mg weekly.  A1c well-controlled at 6.4% today.  Patient Toller medication well.  States she was not able to get a CGM.  Patient was given a sample of freestyle libre 3.  Patient does have supplies at home to check her glucose albeit she does not quite infrequently.  Continue medication as prescribed      Relevant Orders   Microalbumin / creatinine urine ratio   Lipid panel   Comprehensive metabolic panel with GFR   CBC   Hypothyroidism   Patient currently maintained on levothyroxine  50 mcg daily.  Pending TSH today      Relevant Orders   TSH     Nervous and Auditory   Seizure disorder [facility]   Patient is followed by neurology on phenobarbital .  Continue taking medication as prescribed continue following with neurology as recommended      Relevant Orders   TSH     Other   Insomnia   History of same.  Patient uses Ambien  5 mg nightly.  Continue      Relevant Orders   TSH   Bilateral foot pain   The same several surgeries to patient's feet.  She been evaluated for podiatry but would like an orthopedist second opinion.  Into referral to orthopedist today      Relevant Orders   Ambulatory referral to Orthopedic Surgery    Return in about 3 months (around 05/02/2024) for DM recheck.    Margarie Shay, NP

## 2024-01-31 NOTE — Assessment & Plan Note (Signed)
 The same several surgeries to patient's feet.  She been evaluated for podiatry but would like an orthopedist second opinion.  Into referral to orthopedist today

## 2024-01-31 NOTE — Assessment & Plan Note (Signed)
 Patient currently maintained on amlodipine  5 mg daily and losartan  25 mg daily.  Blood pressure well-controlled.  Patient tolerates medication well continue medication as prescribed

## 2024-02-01 LAB — COMPREHENSIVE METABOLIC PANEL WITH GFR
AG Ratio: 1.4 (calc) (ref 1.0–2.5)
ALT: 16 U/L (ref 6–29)
AST: 17 U/L (ref 10–35)
Albumin: 4.3 g/dL (ref 3.6–5.1)
Alkaline phosphatase (APISO): 97 U/L (ref 37–153)
BUN/Creatinine Ratio: 18 (calc) (ref 6–22)
BUN: 19 mg/dL (ref 7–25)
CO2: 24 mmol/L (ref 20–32)
Calcium: 9.9 mg/dL (ref 8.6–10.4)
Chloride: 106 mmol/L (ref 98–110)
Creat: 1.06 mg/dL — ABNORMAL HIGH (ref 0.60–1.00)
Globulin: 3 g/dL (ref 1.9–3.7)
Glucose, Bld: 61 mg/dL — ABNORMAL LOW (ref 65–99)
Potassium: 4.3 mmol/L (ref 3.5–5.3)
Sodium: 140 mmol/L (ref 135–146)
Total Bilirubin: 0.3 mg/dL (ref 0.2–1.2)
Total Protein: 7.3 g/dL (ref 6.1–8.1)
eGFR: 55 mL/min/{1.73_m2} — ABNORMAL LOW (ref >=60)

## 2024-02-01 LAB — CBC
HCT: 38.9 % (ref 35.0–45.0)
Hemoglobin: 12.8 g/dL (ref 11.7–15.5)
MCH: 31.1 pg (ref 27.0–33.0)
MCHC: 32.9 g/dL (ref 32.0–36.0)
MCV: 94.6 fL (ref 80.0–100.0)
MPV: 11.3 fL (ref 7.5–12.5)
Platelets: 277 10*3/uL (ref 140–400)
RBC: 4.11 10*6/uL (ref 3.80–5.10)
RDW: 13.9 % (ref 11.0–15.0)
WBC: 8.3 10*3/uL (ref 3.8–10.8)

## 2024-02-01 LAB — LIPID PANEL
Cholesterol: 117 mg/dL (ref <200)
HDL: 59 mg/dL (ref >=50)
LDL Cholesterol (Calc): 43 mg/dL
Non-HDL Cholesterol (Calc): 58 mg/dL (ref <130)
Total CHOL/HDL Ratio: 2 (calc) (ref <5.0)
Triglycerides: 71 mg/dL (ref <150)

## 2024-02-01 LAB — MICROALBUMIN / CREATININE URINE RATIO
Creatinine, Urine: 141 mg/dL (ref 20–275)
Microalb Creat Ratio: 13 mg/g{creat} (ref <30)
Microalb, Ur: 1.9 mg/dL

## 2024-02-01 LAB — TSH: TSH: 2.27 m[IU]/L (ref 0.40–4.50)

## 2024-02-04 ENCOUNTER — Encounter: Payer: Self-pay | Admitting: Nurse Practitioner

## 2024-02-13 ENCOUNTER — Other Ambulatory Visit: Payer: Self-pay | Admitting: Nurse Practitioner

## 2024-02-16 ENCOUNTER — Other Ambulatory Visit: Payer: Self-pay | Admitting: Nurse Practitioner

## 2024-02-16 DIAGNOSIS — E039 Hypothyroidism, unspecified: Secondary | ICD-10-CM

## 2024-02-18 ENCOUNTER — Encounter: Payer: Self-pay | Admitting: Adult Health

## 2024-02-18 ENCOUNTER — Ambulatory Visit (INDEPENDENT_AMBULATORY_CARE_PROVIDER_SITE_OTHER): Payer: Medicare Other | Admitting: Adult Health

## 2024-02-18 VITALS — BP 156/78 | HR 74 | Ht <= 58 in | Wt 160.6 lb

## 2024-02-18 DIAGNOSIS — I619 Nontraumatic intracerebral hemorrhage, unspecified: Secondary | ICD-10-CM

## 2024-02-18 DIAGNOSIS — G890 Central pain syndrome: Secondary | ICD-10-CM

## 2024-02-18 DIAGNOSIS — G40909 Epilepsy, unspecified, not intractable, without status epilepticus: Secondary | ICD-10-CM | POA: Diagnosis not present

## 2024-02-18 MED ORDER — PHENOBARBITAL 97.2 MG PO TABS: 97.2000 mg | ORAL_TABLET | Freq: Every day | ORAL | 1 refills | Status: DC

## 2024-02-18 MED ORDER — AMITRIPTYLINE HCL 25 MG PO TABS
25.0000 mg | ORAL_TABLET | Freq: Every day | ORAL | 5 refills | Status: DC
Start: 2024-02-18 — End: 2024-07-30

## 2024-02-18 NOTE — Patient Instructions (Addendum)
 Your Plan:  Start amitriptyline 25 mg nightly - if no benefit over 2-3 weeks, please call me so we can increase the dosage or call me sooner with any difficulty tolerating   May need to consider evaluation with pain management   Question if focus difficulty is more from underlying anxiety - if this does not get better on amitriptyline, would recommend you follow up with your PCP for further evaluation  Continue phenobarbital  for seizure prevention - refill provided     Follow up in 6-8 months with Dr. Janett Medin or call earlier if needed      Thank you for coming to see us  at [facility]. I hope we have been able to provide you high quality care today.  You may receive a patient satisfaction survey over the next few weeks. We would appreciate your feedback and comments so that we may continue to improve ourselves and the health of our patients.

## 2024-02-18 NOTE — Progress Notes (Signed)
 [facility] [address] [place]. [place] [address] [phone]       OFFICE FOLLOW UP NOTE  [name] Date of Birth:  May 25, 1951 Medical Record Number:  [id number]    Primary neurologist: Dr. Janett Medin Reason for visit: stroke and seizure follow up    SUBJECTIVE:   CHIEF COMPLAINT:  Chief Complaint  Patient presents with   Follow-up    RM 8, Pt w/spouse. Pt here for f/u. Pt recently started Meloxicam for pain from neuropathy. Pt states left arm is hurting today.      HPI:   Update 02/18/2024 JM: Patient returns for follow-up visit accompanied by her husband.  Overall stable without new stroke/TIA symptoms.  Reports continued left-sided dysesthesias. Self d/c'd pregablin due to lack of benefit, never called requesting dosage adjustment. Continues to have left sided pain and bilateral foot pain.  Recently seen by [facility] who noted severe osteopenia bilaterally, was started on meloxicam but denies any great benefit.  Limited ambulation due to pain.  Husband also notes more difficulties with focusing and attention, she will easily change topics during conversation.  She does have difficulty sleeping at night and takes Ambien  most nights.  No specific memory concerns.  Reports compliance on aspirin  and atorvastatin .  Routinely follows with PCP for stroke risk factor management.  No seizure activity, remains on phenobarbital  without side effects, requesting a refill today.      History provided for reference purposes only Update 08/12/2023 JM: Patient returns for 47-month follow-up accompanied by her daughter.  At prior visit with Jacqlyn Matas, NP, complained of worsening left-sided dysesthesias, reported no benefit on gabapentin  previously, discussed initiating pregabalin  but patient wished to further consider.  Today, she reports gradual worsening of dysesthesias. Reports previously trialing pregabalin  for prior foot injury but not for post stroke symptoms.   Previously tried duloxetine for depression but caused insomnia. Use of RW for short distance, w/c for long distance.  Denies new stroke/TIA symptoms. Denies any seizure activity, remains on phenobarbital  without side effects.  Prior level satisfactory.  Denies any visual changes, no recent diplopia. Had repeat cataract surgery 6-8 months ago with improvement of vision.  Compliant on aspirin  and atorvastatin .  Routinely follows with PCP for stroke risk factor management.  Update 02/05/2023 MM: Natalie [name] is a 73 y.o. female who has been followed in this office for stroke, seizures. Returns today for follow-up.  She is here today with her daughter.  She reports No seizures.  Reports increase numbness and tingling in the LUE and LLE. Tried Gabapentin  in the past. Reports that it never worked.  She states that the left side of body also itches she has tried over-the-counter hydrocortisone cream with no benefit.  She has never tried Lyrica .  Update 07/19/2022 Dr. Janett Medin: Patient returns for follow-up after last visit with Camilo Cella nurse practitioner 5 months ago.  He was asked to see me as he was seen in the ER on 06/07/2022 for sudden onset of diplopia.  He states that the double vision was binocular and more when she was looking to the left.  Was seen in the ER and was found to have mild eye pain as well.  CT scan of the head was obtained on 06/07/2022 which was negative for acute abnormality.  Patient was subsequently seen on ophthalmologist seems to be helping.  Denies any headaches, slurred speech, loss of vision no focal weakness or gait or balance problems.  He has had no other stroke or TIA  symptoms. Phenobarbital  for seizures but has not had seizures for more than 23 years.  Willing to consider reducing the dose of her phenobarbital    .for her migraines in the past she has used stadol  nasal spray as needed but she nhas ran out of the prescription and her PCP is no longer willing to prescribe it..  Migraine  headaches however appear quite infrequent not more than 2 times a month and over-the-counter medications like Tylenol  also seem to work.  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  She is able to ambulate with a walker but states about 4.  She said no falls or injuries.  Also has history of migraines  Update 01/29/2022 JM: Patient returns for 26-month stroke follow-up accompanied by her daughter.  Overall stable without new stroke/TIA symptoms.  Reports continued left-sided dysesthesias currently on gabapentin  300 mg 3 times daily. Does have some days where pain is worse. Unable to say if any improvement since starting gabapentin  after prior visit but she did unfortunately suffer a fall approx 2 mo after resulting in left trimalleolar ankle fracture and femoral condyle fracture requiring open treatment for trimalleolar fracture on 12/29. She is working with [facility] ortho currently doing PT. Continues to use RW, no additional falls.  Compliant on aspirin  and atorvastatin , denies side effects.  Blood pressure today 146/74.  Routinely monitors at home, typically 130s.  She has since establish care with new PCP with completion of lab work today.  No new concerns at this time.  Update 07/27/2021 JM: returns for 6 month stroke follow up accompanied by her daughter.  Overall stable without new stroke/TIA symptoms.  Reports continued left sided numbness/tingling. Left sided pain has been interfering with sleep, walking and any type of activity. At prior visit, declined interest in any medication management.  Also right foot numbness which she believes is from prior foot surgery.  She is currently using rolling walker due to worsening right foot pain.  She has had 3 falls since prior visit but thankfully without injury.  Compliant on aspirin  and atorvastatin  without side effects.  Blood pressure today 156/82.  Routinely monitors at home and typically stable.   Glucose levels stable per report.  Plans on obtaining lab work with PCP in the next week.  No further concerns at this time.   Update 01/25/2021 JM: Natalie [name] returns for 23-month stroke follow-up accompanied by her daughter and infant great granddaughter  Stable since prior visit without new stroke/TIA symptoms.  Residual left sided numbness/tingling (thigh to foot and hand). Possibly improving but denies worsening.  Was seen by Dr. Sharl Davies 2/15 and placed on low-dose gabapentin  but she self discontinued as she did not feel as though this was beneficial.  She declines interest in any further medication management She also continues to have gait instability but also recently underwent right foot hardware removal on 12/27/2020 without complication -she has been slowly recovering from this.  Reports compliance on aspirin  81 mg daily and atorvastatin  without associated side effects Blood pressure today 151/91 - monitors at home and typically lower Glucose levels stable at home per patient report She has not had any recent lab  No new concerns at this time  Initial visit 10/26/2020 JM: Natalie [name] is being seen for hospital follow-up unaccompanied. She was discharged home from CIR on 09/15/2020 with recommendation of HH PT. She reports residual left foot numbness and gait impairment. She also reports right foot fracture s/p surgical procedure  08/2020 with planned hardware removal tomorrow but due to recent stroke, procedure delayed.  She believes this is also be contributing to her gait impairment.  Denies new or worsening stroke/TIA symptoms. She remains on atorvastatin  20 mg daily without myalgias. Blood pressure today 158/78 - monitors at home which typically stable . Glucose levels stable at home. Reports repeat A1c with PCP which was at 7.9.  No concerns at this time.  Stroke admission 09/02/2020 [name] is a 73 y.o. female with history of chronic headaches chronic migraine, chronic numbness  BLE, chronic pain syndrome, CKD, hypertension, hyperlipidemia, type 2 diabetes, and seizures since the age of 45, who presented to [facility] ED on 09/02/2020 with left sided weakness and mild headache. Personally reviewed hospitalization pertinent progress notes, lab work and imaging with summary provided. Evaluated by Dr. Janett Medin with stroke work-up revealing acute right corona radiata IPH with mild surrounding edema and trace IVH, etiology likely hypertensive. History of HTN with long-term BP goal normotensive range. History of HLD on atorvastatin  40 mg daily with LDL 69 therefore decrease dosage to 20 mg daily in setting of recent IPH. History of DM with A1c 8.1 on insulin  and glipizide . Other stroke risk factors include advanced age, obesity, migraines and prior strokes by imaging. Other active problems include CKD and seizure disorder on phenobarbital . Evaluated by therapies and discharged to CIR on 09/06/2020 for ongoing therapy needs and residual left-sided weakness and balance impairment.   Stroke: acute R corona radiata IPH with mild surrounding edema and trace IVH-etiology likely hypertensive CT head -  2 cc acute hematoma in the right corona radiata. There is early extension into the right lateral ventricle. Background chronic small vessel disease.  CT head repeat - Unchanged appearance of right basal ganglia intraparenchymal hemorrhage.  MRI head - Acute R corona radiata IPH with mild surrounding edema and trace IVH. No evidence of underlying lesion. Few chronic microhemorrhages. Mild to moderate chronic microvascular ischemic changes. Chronic small vessel infarcts CTA H&N -no large vessel stenosis or occlusion no aneurysm or AVM. 2D Echo - EF 60-65%. No source of embolus   [employer] Virus 2 - negative LDL - 69 HgbA1c - 8.1 UDS - (not collected)  VTE prophylaxis - SCDs No antithrombotic prior to admission, now on No antithrombotic ICH Therapy recommendations:  CIR  Disposition:   CIR      ROS:   14 system review of systems performed and negative with exception of those listed in HPI  PMH:  Past Medical History:  Diagnosis Date   Arthritis    Atrophy of left kidney    Cervicalgia    Chronic kidney disease, stage 3 (HCC)    acute aki 03-14-2018 in setting pyelonehritis-- resolved   Chronic migraine    Chronic pain syndrome    History of kidney stones    History of pyelonephritis    hx recurrent   History of recurrent UTIs    History of sepsis    07/ 2017 secondary to pyelonephritis   Hypertension    Hypothyroidism    Iron deficiency anemia    Left ureteral stone    Mixed hyperlipidemia    Nephrolithiasis    right side nonobstructive per CT 03-14-2018   Right shoulder pain    post sx 02-18-2018   Seizure disorder (HCC) followed by pcp   per pt dx age 1--  no seizure since 1980's , controlled w/ phenobarbitol   Stroke (HCC) 08/2020   no deficits  Type 2 diabetes mellitus (HCC)    followed by pcp   Wears glasses     PSH:  Past Surgical History:  Procedure Laterality Date   CESAREAN SECTION  1974   CHOLECYSTECTOMY OPEN  2002   CYSTO/  LEFT RETROGRADE PYELOGRAM  06-11-2006   dr Inga Manges  [facility]   CYSTOSCOPY WITH RETROGRADE PYELOGRAM, URETEROSCOPY AND STENT PLACEMENT Left 04/10/2018   Procedure: CYSTOSCOPY WITH RETROGRADE PYELOGRAM, URETEROSCOPY AND STENT PLACEMENT;  Surgeon: Trent Frizzle, MD;  Location: [facility];  Service: Urology;  Laterality: Left;   EYE SURGERY Bilateral    cataract removal   FOOT ARTHRODESIS Right 09/10/2019   Procedure: ARTHRODESIS FOOT;  Surgeon: Donnamarie Gables, MD;  Location: [facility];  Service: Orthopedics;  Laterality: Right;   HARDWARE REMOVAL Right 12/27/2020   Procedure: DEEP ORTHOPEDIC HARDWARE REMOVAL, RIGHT FOOT;  Surgeon: Donnamarie Gables, MD;  Location: [facility];  Service: Orthopedics;  Laterality: Right;   IR URETERAL STENT PLACEMENT EXISTING  ACCESS RIGHT  08-21-2007    dr Grady Lawman  [facility]   LEFT RETROGRADE PYELOGRAM/ LEFT URETERAL STENT PLACEMENT  02-25-2001   dr Inga Manges [facility]   NEPHROLITHOTOMY Right 05/14/2016   Procedure: NEPHROLITHOTOMY PERCUTANEOUS;  Surgeon: Trent Frizzle, MD;  Location: [facility];  Service: Urology;  Laterality: Right;   OPEN REDUCTION INTERNAL FIXATION (ORIF) FOOT LISFRANC FRACTURE Right 09/10/2019   Procedure: OPEN REDUCTION INTERNAL FIXATION (ORIF) FOOT LISFRANC FRACTURE;  Surgeon: Donnamarie Gables, MD;  Location: [facility];  Service: Orthopedics;  Laterality: Right;   ORIF ANKLE FRACTURE Left 09/28/2021   Procedure: OPEN TREATMENT OF LEFT TRIMALLEOLAR FRACTURE WITHOUT POSTERIOR FIXATION, OPEN TREATMENT OF LEFT SYNDESMOSIS DISRUPTION;  Surgeon: Donnamarie Gables, MD;  Location: [facility];  Service: Orthopedics;  Laterality: Left;   REPAIR EXTENSOR TENDON WITH METATARSAL OSTEOTOMY AND OPEN REDUCTION IN Right 09/10/2019   Procedure: OPEN TREATMENT RIGHT FIRST, SECOND, THIRD TARSOMETATARSAL LISFRANC, MIDFOOT FUSION;  Surgeon: Donnamarie Gables, MD;  Location: [facility];  Service: Orthopedics;  Laterality: Right;   SHOULDER ARTHROSCOPY Bilateral left 11-13-2000;  right 02-18-2018   TONSILLECTOMY      Social History:  Social History   Socioeconomic History   Marital status: Married    Spouse name: david   Number of children: 2   Years of education: 2 years of college   Highest education level: Not on file  Occupational History   Not on file  Tobacco Use   Smoking status: Never   Smokeless tobacco: Never  Vaping Use   Vaping status: Never Used  Substance and Sexual Activity   Alcohol use: No    Alcohol/week: 0.0 standard drinks of alcohol   Drug use: No   Sexual activity: Not Currently    Birth control/protection: Post-menopausal  Other Topics Concern   Not on file  Social History Narrative   Was a home maker       Berneta Brightly    Social Drivers of Health    Financial Resource Strain: Low Risk  (04/03/2023)   Overall Financial Resource Strain (CARDIA)    Difficulty of Paying Living Expenses: Not hard at all  Food Insecurity: No Food Insecurity (04/03/2023)   Hunger Vital Sign    Worried About Running Out of Food in the Last Year: Never true    Ran Out of Food in the Last Year: Never true  Transportation Needs: No Transportation Needs (04/03/2023)   PRAPARE - Transportation  Lack of Transportation (Medical): No    Lack of Transportation (Non-Medical): No  Physical Activity: Inactive (04/03/2023)   Exercise Vital Sign    Days of Exercise per Week: 0 days    Minutes of Exercise per Session: 0 min  Stress: No Stress Concern Present (04/03/2023)   [employer] of Occupational Health - Occupational Stress Questionnaire    Feeling of Stress : Not at all  Social Connections: Moderately Integrated (04/03/2023)   Social Connection and Isolation Panel [NHANES]    Frequency of Communication with Friends and Family: More than three times a week    Frequency of Social Gatherings with Friends and Family: Three times a week    Attends Religious Services: 1 to 4 times per year    Active Member of Clubs or Organizations: No    Attends Banker Meetings: Never    Marital Status: Married  Catering manager Violence: Not At Risk (04/03/2023)   Humiliation, Afraid, Rape, and Kick questionnaire    Fear of Current or Ex-Partner: No    Emotionally Abused: No    Physically Abused: No    Sexually Abused: No    Family History:  Family History  Problem Relation Age of Onset   Heart disease Father    Diabetes Father    Migraines Maternal Grandmother    Seizures Maternal Grandmother     Medications:   Current Outpatient Medications on File Prior to Visit  Medication Sig Dispense Refill   amLODipine  (NORVASC ) 5 MG tablet TAKE 1 TABLET (5 MG TOTAL) BY MOUTH DAILY. 90 tablet 1   aspirin  EC 81 MG tablet Take 4 tablets (325 mg total) by mouth daily.  Swallow whole. 30 tablet 0   atorvastatin  (LIPITOR) 80 MG tablet Take 1 tablet (80 mg total) by mouth daily. 90 tablet 2   BD INSULIN  SYRINGE U/F 31G X 5/16" 0.3 ML MISC Use 1 syringe as directed twice a day     cetirizine  (ZYRTEC ) 10 MG tablet Take 1 tablet (10 mg total) by mouth daily. 30 tablet 11   Continuous Glucose Receiver (FREESTYLE LIBRE 3 READER) DEVI 1 each by Does not apply route as directed. 1 each 0   Continuous Glucose Sensor (FREESTYLE LIBRE 3 SENSOR) MISC 1 each by Does not apply route as directed. 6 each 2   glipiZIDE  (GLUCOTROL ) 10 MG tablet Take 1 tablet (10 mg total) by mouth 2 (two) times daily before a meal. 180 tablet 1   levothyroxine  (SYNTHROID ) 50 MCG tablet TAKE 1 TABLET BY MOUTH DAILY BEFORE BREAKFAST 90 tablet 1   losartan  (COZAAR ) 25 MG tablet Take 1 tablet (25 mg total) by mouth daily. 90 tablet 2   meloxicam (MOBIC) 7.5 MG tablet Take 7.5 mg by mouth daily.     NOVOLIN N 100 UNIT/ML injection INJECT 0.2 MLS (20 UNITS TOTAL) INTO THE SKIN IN THE MORNING AND AT BEDTIME. 10 mL 1   PHENobarbital  (LUMINAL) 97.2 MG tablet Take 1 tablet (97.2 mg total) by mouth at bedtime. 30 tablet 5   Semaglutide , 1 MG/DOSE, (OZEMPIC , 1 MG/DOSE,) 4 MG/3ML SOPN INJECT 1 MG ONCE A WEEK AS DIRECTED 3 mL 2   tiZANidine  (ZANAFLEX ) 4 MG tablet TAKE 1 TABLET BY MOUTH EVERY 4-6 HOURS AS NEEDED FOR MUSCLE SPASM (Patient taking differently: Take 4 mg by mouth as needed for muscle spasms.) 180 tablet 1   zolpidem  (AMBIEN ) 5 MG tablet TAKE 1 TABLET BY MOUTH AT BEDTIME FOR SLEEP IF NEEDED. 30 tablet 2  No current facility-administered medications on file prior to visit.    Allergies:   Allergies  Allergen Reactions   Compazine Other (See Comments)    Bad headache, vomiting; IV    Cymbalta [Duloxetine Hcl] Other (See Comments)    "Kept me awake"   Ferrous Sulfate     Methadone Hives   Escitalopram Anxiety   Macrobid  [Nitrofurantoin  Monohyd Macro] Rash   Nitrofuran Derivatives Rash       OBJECTIVE:  Physical Exam  Vitals:   02/18/24 1434  BP: (!) 156/78  Pulse: 74  Weight: 160 lb 9.6 oz (72.8 kg)  Height: 4\' 8"  (1.422 m)   Body mass index is 36.01 kg/m. No results found.  General: well developed, well nourished, very pleasant elderly Caucasian female, seated, in no evident distress  Neurologic Exam Mental Status: Awake and fully alert. Fluent speech and language. Oriented to place and time. Recent and remote memory intact. Attention span and concentration subjectively impaired and fund of knowledge appropriate. Mood and affect flat, depressed looking.   Cranial Nerves: Pupils equal, briskly reactive to light. Extraocular movements full without nystagmus. Visual fields full to confrontation. Hearing intact. Facial sensation intact. Face, tongue, palate moves normally and symmetrically.  Motor: Normal bulk and tone. Normal strength in all tested extremity muscles except difficulty fully testing LLE d/t pain Sensory.: reports altered light touch sensation LLE compared to RLE, intact BUE Coordination: Rapid alternating movements normal in all extremities. Finger-to-nose and heel-to-shin performed accurately bilaterally. Gait and Station: Deferred as RW not present Reflexes: 1+ and symmetric. Toes downgoing.       ASSESSMENT: Natalie [name] is a 73 y.o. year old female with hypertensive right CR IPH in 08/2020. Vascular risk factors include HTN, HLD, DM, obesity, migraines, seizure disorder and prior strokes on imaging.  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  Start amitriptyline 25 mg nightly, advised to call after 2 to 3 weeks if no benefit for dosage increase or sooner if difficulty tolerating.  If symptoms persist, may need to consider pain management evaluation for further  recommendations.  Suspect focus difficulty more due to underlying anxiety and constant pain, hopefully amitriptyline will also help with anxiety but advised to follow-up with PCP if the symptoms persist Previously tried/failed: Gabapentin , duloxetine, pregabalin  (although only was on low dose prior to stopping), meloxicam (did discuss concern of being on meloxicam and aspirin  and increased risk of bleeding especially with history of IPH, encouraged follow-up with Ortho for discussion of other treatment options for severe osteopenia of bilateral feet) Continue aspirin  81 mg daily and atorvastatin  20 mg daily for secondary stroke prevention managed/refilled by PCP Discussed secondary stroke prevention measures and importance of close PCP follow up for aggressive stroke risk factor management including BP goal<130/90, HLD with LDL goal<70 and DM with A1c.<7   Seizure disorder Remains stable on phenobarbital , no recurrent seizures in over 30 years, previously managed by prior PCP  Continue phenobarbital  97.2 mg daily - refill provided  Phenobarbital  level 26 (01/2023) - recent labs by PCP, she does not wish to have additional labs today. Request level be obtained by PCP at next lab draw and can send results to us  to review     Follow-up in 7 months with Dr. Janett Medin for routine follow-up or call earlier if needed  CC:  Dorothe Gaster, NP    I spent 40 minutes of face-to-face and non-face-to-face time with patient and husband.  This included previsit chart review, lab review, study review, order entry, electronic health record documentation, patient education and discussion regarding above diagnoses and treatment plan and answered all other questions to patient and husband's satisfaction  Johny Nap, [facility]  [facility] [address] [place], [place] [address]  Phone [phone] Fax [phone] Note: This document was prepared with digital dictation and  possible smart phrase technology. Any transcriptional errors that result from this process are unintentional.

## 2024-03-04 ENCOUNTER — Telehealth: Payer: Self-pay | Admitting: Adult Health

## 2024-03-04 NOTE — Telephone Encounter (Signed)
 Pt calling to inform Camilo Cella, NP that she is able to tolerate the amitriptyline  (ELAVIL ) 25 MG tablet

## 2024-03-05 ENCOUNTER — Telehealth: Payer: Self-pay

## 2024-03-05 NOTE — Telephone Encounter (Signed)
 Sent mychart message to patient regarding medication refill.

## 2024-03-05 NOTE — Telephone Encounter (Signed)
 Attempted to call Pt. No answer, LVM for call back.

## 2024-03-05 NOTE — Telephone Encounter (Signed)
 Copied from CRM [id number]. Topic: Clinical - Medication Question >> Mar 05, 2024  9:35 AM Rosamond Comes wrote: Reason for CRM: patient requesting refill for meloxicam (MOBIC) 7.5 MG tablet this was prescribed by Dr Suszanne Eriksson. Patient has spoken to Winthrop Hawks NP about taking a higher dosages

## 2024-03-12 ENCOUNTER — Telehealth: Payer: Self-pay

## 2024-03-12 NOTE — Telephone Encounter (Signed)
 Form has been received to office. Natalie [name] has filled out and I have faxed to [phone]. Attached copy of med list and snap shot.  No further action needed at this time.

## 2024-04-04 ENCOUNTER — Other Ambulatory Visit: Payer: Self-pay | Admitting: Nurse Practitioner

## 2024-04-25 ENCOUNTER — Other Ambulatory Visit: Payer: Self-pay | Admitting: Nurse Practitioner

## 2024-04-25 DIAGNOSIS — I10 Essential (primary) hypertension: Secondary | ICD-10-CM

## 2024-04-25 DIAGNOSIS — E1142 Type 2 diabetes mellitus with diabetic polyneuropathy: Secondary | ICD-10-CM

## 2024-04-27 ENCOUNTER — Other Ambulatory Visit: Payer: Self-pay | Admitting: Nurse Practitioner

## 2024-04-27 DIAGNOSIS — E1142 Type 2 diabetes mellitus with diabetic polyneuropathy: Secondary | ICD-10-CM

## 2024-04-29 ENCOUNTER — Other Ambulatory Visit: Payer: Self-pay | Admitting: Nurse Practitioner

## 2024-04-29 DIAGNOSIS — G4700 Insomnia, unspecified: Secondary | ICD-10-CM

## 2024-05-04 ENCOUNTER — Ambulatory Visit (INDEPENDENT_AMBULATORY_CARE_PROVIDER_SITE_OTHER): Admitting: Nurse Practitioner

## 2024-05-04 ENCOUNTER — Telehealth: Payer: Self-pay | Admitting: Nurse Practitioner

## 2024-05-04 VITALS — BP 138/62 | HR 73 | Temp 98.1°F | Ht <= 58 in | Wt 161.8 lb

## 2024-05-04 DIAGNOSIS — Z7985 Long-term (current) use of injectable non-insulin antidiabetic drugs: Secondary | ICD-10-CM | POA: Diagnosis not present

## 2024-05-04 DIAGNOSIS — E1142 Type 2 diabetes mellitus with diabetic polyneuropathy: Secondary | ICD-10-CM | POA: Diagnosis not present

## 2024-05-04 DIAGNOSIS — M79672 Pain in left foot: Secondary | ICD-10-CM

## 2024-05-04 DIAGNOSIS — M79671 Pain in right foot: Secondary | ICD-10-CM | POA: Diagnosis not present

## 2024-05-04 DIAGNOSIS — I10 Essential (primary) hypertension: Secondary | ICD-10-CM

## 2024-05-04 LAB — POCT GLYCOSYLATED HEMOGLOBIN (HGB A1C): Hemoglobin A1C: 6 % — AB (ref 4.0–5.6)

## 2024-05-04 NOTE — Assessment & Plan Note (Signed)
 Patient currently maintained on amlodipine  5 mg and losartan  25 mg daily.  Checks blood pressure infrequently at home.  Denies any adverse drug events.  Continue medication as prescribed

## 2024-05-04 NOTE — Telephone Encounter (Signed)
 Natalie [name],  Patient was which she generally has 4 pens left of Ozempic .  From understanding she gets this through PAP.  Just 1 to make sure there is nothing that needs to happen from end that way patient does not run out of medication.  Thanks

## 2024-05-04 NOTE — Assessment & Plan Note (Signed)
 Was seen by orthopedics given meloxicam 7.5 mg daily.  They deferred further treatment to neurology which started on amitriptyline  25 mg nightly.  Continue taking medication as prescribed follow-up with specialist as recommended

## 2024-05-04 NOTE — Assessment & Plan Note (Signed)
 Patient currently maintained on Ozempic  1 mg weekly, Novolin 20 mg twice daily, glipizide  10 mg twice daily.  Patient will use CGM no she can but has difficulty getting it placed informed her she can make a nurse visit if she needs help getting it placed as she is only checking her sugar approximate once a month otherwise.  Patient's A1c stable at 6.0% today continue medication as prescribed

## 2024-05-04 NOTE — Patient Instructions (Signed)
 Nice to see you today Your A1C was great today at 6.0% Follow up with me in 6 months sooner if you need me  Pick up the continuous glucose monitor and wear it. If you need help putting it on make a nurse visit

## 2024-05-04 NOTE — Progress Notes (Signed)
 Established Patient Office Visit  Subjective   Patient ID: Natalie [name], female    DOB: [birth year]  Age: 73 y.o. MRN: [id number]  Chief Complaint  Patient presents with   Diabetes    HPI  DM2: Patient currently maintained on glipizide  10 mg twice daily, Novolin 20 units twice daily, Ozempic  1 mg weekly. States that he her husbanc does mos tof the cooking and was out ofr 3 weeks and she ate out a lot. Stats that she is not been able to get the CMG because she  States she has been using the older method and doing her glucose once a month   HTN: Patient currently maintained on amlodipine  5 mg daily, losartan  25 mg daily. She can check her blood pressure at hom.     Review of Systems  Constitutional:  Negative for chills and fever.  Respiratory:  Negative for shortness of breath.   Cardiovascular:  Negative for chest pain.  Gastrointestinal:        BM daily   Neurological:  Negative for dizziness and headaches.      Objective:     BP 138/62   Pulse 73   Temp 98.1 F (36.7 C) (Oral)   Ht 4' 8 (1.422 m)   Wt 161 lb 12.8 oz (73.4 kg)   SpO2 95%   BMI 36.27 kg/m  BP Readings from Last 3 Encounters:  05/04/24 138/62  02/18/24 (!) 156/78  01/31/24 102/60   Wt Readings from Last 3 Encounters:  05/04/24 161 lb 12.8 oz (73.4 kg)  02/18/24 160 lb 9.6 oz (72.8 kg)  01/31/24 159 lb 9.6 oz (72.4 kg)   SpO2 Readings from Last 3 Encounters:  05/04/24 95%  01/31/24 95%  08/15/23 96%      Physical Exam Vitals and nursing note reviewed.  Constitutional:      Appearance: Normal appearance.  Cardiovascular:     Rate and Rhythm: Normal rate and regular rhythm.     Heart sounds: Normal heart sounds.  Pulmonary:     Effort: Pulmonary effort is normal.     Breath sounds: Normal breath sounds.  Abdominal:     General: Bowel sounds are normal.  Neurological:     Mental Status: She is alert.      Results for orders placed or performed in visit on 05/04/24  POCT  glycosylated hemoglobin (Hb A1C)  Result Value Ref Range   Hemoglobin A1C 6.0 (A) 4.0 - 5.6 %   HbA1c POC (<> result, manual entry)     HbA1c, POC (prediabetic range)     HbA1c, POC (controlled diabetic range)        The ASCVD Risk score (Arnett DK, et al., 2019) failed to calculate for the following reasons:   Risk score cannot be calculated because patient has a medical history suggesting prior/existing ASCVD    Assessment & Plan:   Problem List Items Addressed This Visit       Cardiovascular and Mediastinum   HTN (hypertension)   Patient currently maintained on amlodipine  5 mg and losartan  25 mg daily.  Checks blood pressure infrequently at home.  Denies any adverse drug events.  Continue medication as prescribed        Endocrine   Diabetes mellitus, type II (HCC) - Primary   Patient currently maintained on Ozempic  1 mg weekly, Novolin 20 mg twice daily, glipizide  10 mg twice daily.  Patient will use CGM no she can but has difficulty getting it placed informed her  she can make a nurse visit if she needs help getting it placed as she is only checking her sugar approximate once a month otherwise.  Patient's A1c stable at 6.0% today continue medication as prescribed      Relevant Orders   POCT glycosylated hemoglobin (Hb A1C) (Completed)     Other   Bilateral foot pain   Was seen by orthopedics given meloxicam 7.5 mg daily.  They deferred further treatment to neurology which started on amitriptyline  25 mg nightly.  Continue taking medication as prescribed follow-up with specialist as recommended       Return in about 6 months (around 11/04/2024) for DM recheck.    Adina Crandall, NP

## 2024-05-04 NOTE — Telephone Encounter (Signed)
 That would be great to have that form  thanks

## 2024-05-12 ENCOUNTER — Telehealth: Payer: Self-pay

## 2024-05-12 NOTE — Telephone Encounter (Signed)
 Received patient assistance medications for patient.  Ozempic  4boxes  Medications have been placed in the refrigerator and labeled with patient information   Contacted pt and left detailed voicemail and mychart message.

## 2024-05-22 ENCOUNTER — Other Ambulatory Visit: Payer: Self-pay | Admitting: Nurse Practitioner

## 2024-05-28 NOTE — Telephone Encounter (Signed)
 Meds have not been picked up. Left message to call office so we can verify she is aware.

## 2024-05-29 NOTE — Telephone Encounter (Signed)
 Lvm asking pt to call back. Plz notify her we still have her Ozempic  shipment and needs to be picked up as soon as possible.

## 2024-05-29 NOTE — Telephone Encounter (Signed)
 Left detailed voicemail for patient to call the office back.

## 2024-06-02 NOTE — Telephone Encounter (Signed)
 Left detailed voicemail for patient to call the office back.

## 2024-06-05 NOTE — Telephone Encounter (Unsigned)
 Copied from CRM #[id number]. Topic: General - Other >> Jun 05, 2024  1:48 PM Burnard DEL wrote: Reason for CRM: Patient called in the office to check on shipment of ozempic ,I informed her that her shipment was ready to be picked up at the office.

## 2024-07-06 ENCOUNTER — Encounter: Payer: Self-pay | Admitting: Pharmacist

## 2024-07-22 ENCOUNTER — Telehealth: Payer: Self-pay | Admitting: Nurse Practitioner

## 2024-07-22 NOTE — Telephone Encounter (Signed)
 Pt came in and would for Cable  to give her a call concerning her Ozempic  being discontinued

## 2024-07-23 NOTE — Telephone Encounter (Signed)
 Can we call and get more information. I have not heard that ozempic  is being discontinued.   If the patient is on patient assistance through the drug company that program is stopping but the medication will still be produced and available

## 2024-07-24 NOTE — Telephone Encounter (Signed)
 Attempted to call pt, no answer, unable to leave voicemail.  Will also send mychart message.

## 2024-07-29 ENCOUNTER — Other Ambulatory Visit: Payer: Self-pay | Admitting: Adult Health

## 2024-08-01 ENCOUNTER — Other Ambulatory Visit: Payer: Self-pay | Admitting: Nurse Practitioner

## 2024-08-01 DIAGNOSIS — I10 Essential (primary) hypertension: Secondary | ICD-10-CM

## 2024-08-03 ENCOUNTER — Other Ambulatory Visit: Payer: Self-pay | Admitting: Adult Health

## 2024-08-03 ENCOUNTER — Other Ambulatory Visit: Payer: Self-pay | Admitting: Family

## 2024-08-03 ENCOUNTER — Other Ambulatory Visit: Payer: Self-pay | Admitting: Nurse Practitioner

## 2024-08-03 DIAGNOSIS — G4700 Insomnia, unspecified: Secondary | ICD-10-CM

## 2024-08-03 MED ORDER — PHENOBARBITAL 97.2 MG PO TABS: 97.2000 mg | ORAL_TABLET | Freq: Every day | ORAL | 0 refills | Status: DC

## 2024-08-03 NOTE — Telephone Encounter (Signed)
 Last visit: 02/18/24  Next visit: 09/10/24  Last fills:    From last visit:    Seizure disorder Remains stable on phenobarbital , no recurrent seizures in over 30 years, previously managed by prior PCP  Continue phenobarbital  97.2 mg daily - refill provided  Phenobarbital  level 26 (01/2023) - recent labs by PCP, she does not wish to have additional labs today. Request level be obtained by PCP at next lab draw and can send results to us  to review    Rx request sent to [facility] NP to e-sign.

## 2024-08-03 NOTE — Telephone Encounter (Signed)
 Patient request refill for PHENobarbital  (LUMINAL) 97.2 MG tablet [facility] [phone]

## 2024-08-03 NOTE — Telephone Encounter (Unsigned)
 Copied from CRM [id number]. Topic: Clinical - Medication Refill >> Aug 03, 2024  9:45 AM Charolett L wrote: Medication: zolpidem  (AMBIEN ) 5 MG tablet  Has the patient contacted their pharmacy? Yes (Agent: If no, request that the patient contact the pharmacy for the refill. If patient does not wish to contact the pharmacy document the reason why and proceed with request.) (Agent: If yes, when and what did the pharmacy advise?)  This is the patient's preferred pharmacy:  [phone] [place] - [address] [place] [address] Phone: [phone] Fax: [phone]  Is this the correct pharmacy for this prescription? Yes If no, delete pharmacy and type the correct one.   Has the prescription been filled recently? Yes  Is the patient out of the medication? Yes  Has the patient been seen for an appointment in the last year OR does the patient have an upcoming appointment? Yes  Can we respond through MyChart? Yes  Agent: Please be advised that Rx refills may take up to 3 business days. We ask that you follow-up with your pharmacy.

## 2024-08-05 MED ORDER — ZOLPIDEM TARTRATE 5 MG PO TABS: ORAL_TABLET | ORAL | 2 refills | Status: DC

## 2024-08-06 ENCOUNTER — Ambulatory Visit

## 2024-08-06 VITALS — BP 128/68 | Ht <= 58 in | Wt 161.0 lb

## 2024-08-06 DIAGNOSIS — Z23 Encounter for immunization: Secondary | ICD-10-CM

## 2024-08-06 DIAGNOSIS — Z0000 Encounter for general adult medical examination without abnormal findings: Secondary | ICD-10-CM | POA: Diagnosis not present

## 2024-08-06 NOTE — Patient Instructions (Signed)
 Natalie [name],  Thank you for taking the time for your Medicare Wellness Visit. I appreciate your continued commitment to your health goals. Please review the care plan we discussed, and feel free to reach out if I can assist you further.  Please note that Annual Wellness Visits do not include a physical exam. Some assessments may be limited, especially if the visit was conducted virtually. If needed, we may recommend an in-person follow-up with your provider.  Ongoing Care Seeing your primary care provider every 3 to 6 months helps us  monitor your health and provide consistent, personalized care.   Referrals If a referral was made during today's visit and you haven't received any updates within two weeks, please contact the referred provider directly to check on the status.  Recommended Screenings:  Health Maintenance  Topic Date Due   Zoster (Shingles) Vaccine (2 of 2) 02/03/2019   Eye exam for diabetics  11/22/2019   Complete foot exam   03/14/2024   Medicare Annual Wellness Visit  04/02/2024   Flu Shot  05/01/2024   COVID-19 Vaccine (5 - 2025-26 season) 06/01/2024   DTaP/Tdap/Td vaccine (3 - Td or Tdap) 07/25/2024   Hemoglobin A1C  11/04/2024   Yearly kidney function blood test for diabetes  01/30/2025   Yearly kidney health urinalysis for diabetes  01/30/2025   Pneumococcal Vaccine for age over 67  Completed   DEXA scan (bone density measurement)  Completed   Hepatitis C Screening  Completed   Meningitis B Vaccine  Aged Out   Breast Cancer Screening  Discontinued   Cologuard (Stool DNA test)  Discontinued       08/06/2024    2:29 PM  Advanced Directives  Does Patient Have a Medical Advance Directive? Yes  Type of Estate Agent of [place];Living will  Copy of Healthcare Power of Attorney in Chart? No - copy requested    Vision: Annual vision screenings are recommended for early detection of glaucoma, cataracts, and diabetic retinopathy. These exams can  also reveal signs of chronic conditions such as diabetes and high blood pressure.  Dental: Annual dental screenings help detect early signs of oral cancer, gum disease, and other conditions linked to overall health, including heart disease and diabetes.

## 2024-08-06 NOTE — Progress Notes (Signed)
 Subjective:   Natalie [name] is a 73 y.o. female who presents for a Medicare Annual Wellness Visit.  Allergies (verified) Compazine, Cymbalta [duloxetine hcl], Ferrous sulfate , Methadone, Escitalopram, Macrobid  [nitrofurantoin  monohyd macro], and Nitrofuran derivatives   History: Past Medical History:  Diagnosis Date   Arthritis    Atrophy of left kidney    Cervicalgia    Chronic kidney disease, stage 3 (HCC)    acute aki 03-14-2018 in setting pyelonehritis-- resolved   Chronic migraine    Chronic pain syndrome    History of kidney stones    History of pyelonephritis    hx recurrent   History of recurrent UTIs    History of sepsis    07/ 2017 secondary to pyelonephritis   Hypertension    Hypothyroidism    Iron deficiency anemia    Left ureteral stone    Mixed hyperlipidemia    Nephrolithiasis    right side nonobstructive per CT 03-14-2018   Right shoulder pain    post sx 02-18-2018   Seizure disorder (HCC) followed by pcp   per pt dx age 18--  no seizure since 1980's , controlled w/ phenobarbitol   Stroke (HCC) 08/2020   no deficits   Type 2 diabetes mellitus (HCC)    followed by pcp   Wears glasses    Past Surgical History:  Procedure Laterality Date   CESAREAN SECTION  1974   CHOLECYSTECTOMY OPEN  2002   CYSTO/  LEFT RETROGRADE PYELOGRAM  06-11-2006   dr watt  [facility]   CYSTOSCOPY WITH RETROGRADE PYELOGRAM, URETEROSCOPY AND STENT PLACEMENT Left 04/10/2018   Procedure: CYSTOSCOPY WITH RETROGRADE PYELOGRAM, URETEROSCOPY AND STENT PLACEMENT;  Surgeon: Matilda Senior, MD;  Location: [facility];  Service: Urology;  Laterality: Left;   EYE SURGERY Bilateral    cataract removal   FOOT ARTHRODESIS Right 09/10/2019   Procedure: ARTHRODESIS FOOT;  Surgeon: Elsa Lonni SAUNDERS, MD;  Location: [facility];  Service: Orthopedics;  Laterality: Right;   HARDWARE REMOVAL Right 12/27/2020   Procedure: DEEP ORTHOPEDIC HARDWARE REMOVAL, RIGHT  FOOT;  Surgeon: Elsa Lonni SAUNDERS, MD;  Location: [facility];  Service: Orthopedics;  Laterality: Right;   IR URETERAL STENT PLACEMENT EXISTING ACCESS RIGHT  08-21-2007    dr ceil  [facility]   LEFT RETROGRADE PYELOGRAM/ LEFT URETERAL STENT PLACEMENT  02-25-2001   dr watt [facility]   NEPHROLITHOTOMY Right 05/14/2016   Procedure: NEPHROLITHOTOMY PERCUTANEOUS;  Surgeon: Senior Matilda, MD;  Location: [facility];  Service: Urology;  Laterality: Right;   OPEN REDUCTION INTERNAL FIXATION (ORIF) FOOT LISFRANC FRACTURE Right 09/10/2019   Procedure: OPEN REDUCTION INTERNAL FIXATION (ORIF) FOOT LISFRANC FRACTURE;  Surgeon: Elsa Lonni SAUNDERS, MD;  Location: [facility];  Service: Orthopedics;  Laterality: Right;   ORIF ANKLE FRACTURE Left 09/28/2021   Procedure: OPEN TREATMENT OF LEFT TRIMALLEOLAR FRACTURE WITHOUT POSTERIOR FIXATION, OPEN TREATMENT OF LEFT SYNDESMOSIS DISRUPTION;  Surgeon: Elsa Lonni SAUNDERS, MD;  Location: [facility];  Service: Orthopedics;  Laterality: Left;   REPAIR EXTENSOR TENDON WITH METATARSAL OSTEOTOMY AND OPEN REDUCTION IN Right 09/10/2019   Procedure: OPEN TREATMENT RIGHT FIRST, SECOND, THIRD TARSOMETATARSAL LISFRANC, MIDFOOT FUSION;  Surgeon: Elsa Lonni SAUNDERS, MD;  Location: [facility];  Service: Orthopedics;  Laterality: Right;   SHOULDER ARTHROSCOPY Bilateral left 11-13-2000;  right 02-18-2018   TONSILLECTOMY     Family History  Problem Relation Age of Onset   Heart disease Father    Diabetes Father  Migraines Maternal Grandmother    Seizures Maternal Grandmother    Social History   Occupational History   Not on file  Tobacco Use   Smoking status: Never   Smokeless tobacco: Never  Vaping Use   Vaping status: Never Used  Substance and Sexual Activity   Alcohol use: No    Alcohol/week: 0.0 standard drinks of alcohol   Drug use: No   Sexual activity: Not Currently    Birth control/protection: Post-menopausal   Tobacco  Counseling Counseling given: Not Answered  SDOH Screenings   Food Insecurity: No Food Insecurity (08/06/2024)  Housing: Unknown (08/06/2024)  Transportation Needs: No Transportation Needs (08/06/2024)  Utilities: Not At Risk (08/06/2024)  Alcohol Screen: Low Risk  (04/03/2023)  Depression (PHQ2-9): Low Risk  (08/06/2024)  Financial Resource Strain: Low Risk  (04/03/2023)  Physical Activity: Inactive (08/06/2024)  Social Connections: Moderately Integrated (08/06/2024)  Stress: No Stress Concern Present (08/06/2024)  Tobacco Use: Low Risk  (08/06/2024)  Health Literacy: Adequate Health Literacy (08/06/2024)   Depression Screen    08/06/2024    2:34 PM 05/04/2024    1:52 PM 08/15/2023    3:04 PM 04/03/2023    5:19 PM 02/07/2023    4:29 PM 11/15/2020    2:29 PM 10/05/2020    1:08 PM  PHQ 2/9 Scores  PHQ - 2 Score 0 0 0 0 0 0 0  PHQ- 9 Score  0  0  2  2   0      Data saved with a previous flowsheet row definition     Goals Addressed             This Visit's Progress    Patient Stated       No new goals     COMPLETED: Prevent falls         Visit info / Clinical Intake: Medicare Wellness Visit Type:: Subsequent Annual Wellness Visit Medicare Wellness Visit Mode:: In-person (required for WTM) Interpreter Needed?: No Pre-visit prep was completed: yes AWV questionnaire completed by patient prior to visit?: no Living arrangements:: lives with spouse/significant other Patient's Overall Health Status Rating: good Typical amount of pain: none Does pain affect daily life?: no Are you currently prescribed opioids?: no  Dietary Habits and Nutritional Risks How many meals a day?: 2 Eats fruit and vegetables daily?: yes Most meals are obtained by: having others provide food (daughter or husband prepares) In the last 2 weeks, have you had any of the following?: -- (none) Diabetic:: (!) yes Any non-healing wounds?: no How often do you check your BS?: as needed Would you like to be referred to  a Nutritionist or for Diabetic Management? : no  Functional Status Activities of Daily Living (to include ambulation/medication): (!) Needs Assist Feeding: Independent Dressing/Grooming: Needs assistance Bathing: Needs assistance Toileting: Independent Transfer: Independent Ambulation: Independent with device- listed below Home Assistive Devices/Equipment: Wheelchair Medication Administration: Independent Home Management: Needs assistance (comment) Manage your own finances?: yes (does with husband) Concerns about vision?: no *vision screening is required for WTM* Concerns about hearing?: no  Fall Screening Falls in the past year?: 0 Number of falls in past year: 0 Was there an injury with Fall?: 0 Fall Risk Category Calculator: 0 Patient Fall Risk Level: Low Fall Risk  Fall Risk Patient at Risk for Falls Due to: Impaired mobility; Impaired balance/gait Fall risk Follow up: Falls prevention discussed; Education provided  Home and Transportation Safety: All rugs have non-skid backing?: N/A, no rugs All stairs or steps have  railings?: yes Grab bars in the bathtub or shower?: yes Have non-skid surface in bathtub or shower?: yes Good home lighting?: yes Regular seat belt use?: yes Hospital stays in the last year:: no  Cognitive Assessment Difficulty concentrating, remembering, or making decisions? : no Will 6CIT or Mini Cog be Completed: yes What year is it?: 0 points What month is it?: 0 points Give patient an address phrase to remember (5 components): [address]  About what time is it?: 0 points Count backwards from 20 to 1: 0 points Say the months of the year in reverse: 0 points Repeat the address phrase from earlier: 0 points 6 CIT Score: 0 points  Advance Directives (For Healthcare) Does Patient Have a Medical Advance Directive?: Yes Type of Advance Directive: Healthcare Power of [employer]; Living will Copy of Healthcare Power of Attorney in  Chart?: No - copy requested Copy of Living Will in Chart?: No - copy requested  Reviewed/Updated  Reviewed/Updated: All        Objective:    Today's Vitals   08/06/24 1427  BP: 128/68  Weight: 161 lb (73 kg)  Height: 4' 9 (1.448 m)   Body mass index is 34.84 kg/m.  Current Medications (verified) Outpatient Encounter Medications as of 08/06/2024  Medication Sig   amitriptyline  (ELAVIL ) 25 MG tablet TAKE 1 TABLET BY MOUTH EVERYDAY AT BEDTIME   amLODipine  (NORVASC ) 5 MG tablet TAKE 1 TABLET (5 MG TOTAL) BY MOUTH DAILY.   aspirin  EC 81 MG tablet Take 4 tablets (325 mg total) by mouth daily. Swallow whole.   atorvastatin  (LIPITOR) 80 MG tablet TAKE 1 TABLET BY MOUTH EVERY DAY   BD INSULIN  SYRINGE U/F 31G X 5/16 0.3 ML MISC Use 1 syringe as directed twice a day   cetirizine  (ZYRTEC ) 10 MG tablet Take 1 tablet (10 mg total) by mouth daily.   Continuous Glucose Receiver (FREESTYLE LIBRE 3 READER) DEVI 1 each by Does not apply route as directed.   Continuous Glucose Sensor (FREESTYLE LIBRE 3 SENSOR) MISC 1 each by Does not apply route as directed.   glipiZIDE  (GLUCOTROL ) 10 MG tablet Take 1 tablet (10 mg total) by mouth 2 (two) times daily before a meal.   levothyroxine  (SYNTHROID ) 50 MCG tablet TAKE 1 TABLET BY MOUTH DAILY BEFORE BREAKFAST   losartan  (COZAAR ) 25 MG tablet TAKE 1 TABLET (25 MG TOTAL) BY MOUTH DAILY.   meloxicam (MOBIC) 7.5 MG tablet Take 7.5 mg by mouth daily.   NOVOLIN N 100 UNIT/ML injection INJECT 0.2 MLS (20 UNITS TOTAL) INTO THE SKIN IN THE MORNING AND AT BEDTIME.   PHENobarbital  (LUMINAL) 97.2 MG tablet Take 1 tablet (97.2 mg total) by mouth at bedtime.   Semaglutide , 1 MG/DOSE, (OZEMPIC , 1 MG/DOSE,) 4 MG/3ML SOPN INJECT 1 MG ONCE A WEEK AS DIRECTED   tiZANidine  (ZANAFLEX ) 4 MG tablet TAKE 1 TABLET BY MOUTH EVERY 4-6 HOURS AS NEEDED FOR MUSCLE SPASM (Patient taking differently: Take 4 mg by mouth as needed for muscle spasms.)   zolpidem  (AMBIEN ) 5 MG tablet take  1 tablet by mouth at bedtime for sleep if needed.   No facility-administered encounter medications on file as of 08/06/2024.   Hearing/Vision screen Vision Screening - Comments:: UTD visits w/Dr Cleotilde Immunizations and Health Maintenance Health Maintenance  Topic Date Due   Zoster Vaccines- Shingrix (2 of 2) 02/03/2019   OPHTHALMOLOGY EXAM  11/22/2019   FOOT EXAM  03/14/2024   COVID-19 Vaccine (5 - 2025-26 season) 06/01/2024   DTaP/Tdap/Td (  3 - Td or Tdap) 07/25/2024   HEMOGLOBIN A1C  11/04/2024   Diabetic kidney evaluation - eGFR measurement  01/30/2025   Diabetic kidney evaluation - Urine ACR  01/30/2025   Medicare Annual Wellness (AWV)  08/06/2025   Pneumococcal Vaccine: 50+ Years  Completed   Influenza Vaccine  Completed   DEXA SCAN  Completed   Hepatitis C Screening  Completed   Meningococcal B Vaccine  Aged Out   Mammogram  Discontinued   Fecal DNA (Cologuard)  Discontinued        Assessment/Plan:  This is a routine wellness examination for [place].  Patient Care Team: Wendee Lynwood HERO, NP as PCP - General (Nurse Practitioner) Cleotilde Sewer, OD as Consulting Physician (Optometry) Dozier Soulier, MD as Consulting Physician (Orthopedic Surgery) Gaynel Delon CROME, DPM as Consulting Physician (Podiatry) Fredirick Glenys RAMAN, MD as Consulting Physician (Obstetrics and Gynecology) Sherryl Bouchard, NP as Registered Nurse (Neurology)  I have personally reviewed and noted the following in the patient's chart:   Medical and social history Use of alcohol, tobacco or illicit drugs  Current medications and supplements including opioid prescriptions. Functional ability and status Nutritional status Physical activity Advanced directives List of other physicians Hospitalizations, surgeries, and ER visits in previous 12 months Vitals Screenings to include cognitive, depression, and falls Referrals and appointments  Orders Placed This Encounter  Procedures   Flu vaccine HIGH  DOSE PF(Fluzone Trivalent)   In addition, I have reviewed and discussed with patient certain preventive protocols, quality metrics, and best practice recommendations. A written personalized care plan for preventive services as well as general preventive health recommendations were provided to patient.   Erminio CROME Saris, LPN   88/11/7972   Return in 1 year (on 08/06/2025).  After Visit Summary: (In Person-Printed) AVS printed and given to the patient  Nurse Notes: Pt has no concerns or questions. AWV made for one year

## 2024-08-12 ENCOUNTER — Telehealth: Payer: Self-pay | Admitting: Nurse Practitioner

## 2024-08-12 NOTE — Telephone Encounter (Signed)
 No need for this script as her A1C was well controlled at 6.0

## 2024-08-12 NOTE — Telephone Encounter (Signed)
 Patient would like to know if she can get this rx refilled again insulin  aspart (novoLOG ) injection 10 Units   Not on current med list, last filled 2021

## 2024-08-12 NOTE — Telephone Encounter (Signed)
 Left detailed voicemail for patient to call the office back.

## 2024-08-12 NOTE — Telephone Encounter (Signed)
 Copied from CRM [id number]. Topic: Clinical - Medication Refill >> Aug 12, 2024 10:03 AM Avram MATSU wrote: Medication: patient would like to know if she can get this rx refilled again insulin  aspart (novoLOG ) injection 10 Units   Has the patient contacted their pharmacy? Yes (Agent: If no, request that the patient contact the pharmacy for the refill. If patient does not wish to contact the pharmacy document the reason why and proceed with request.) (Agent: If yes, when and what did the pharmacy advise?)  This is the patient's preferred pharmacy:  [phone] [place] - [address] [place] [address] Phone: [phone] Fax: [phone]   Is this the correct pharmacy for this prescription? Yes If no, delete pharmacy and type the correct one.   Has the prescription been filled recently? No  Is the patient out of the medication? Yes  Has the patient been seen for an appointment in the last year OR does the patient have an upcoming appointment? Yes  Can we respond through MyChart? No  Agent: Please be advised that Rx refills may take up to 3 business days. We ask that you follow-up with your pharmacy.   ----------------------------------------------------------------------- From previous Reason for Contact - Prescription Issue: Reason for C

## 2024-08-14 ENCOUNTER — Other Ambulatory Visit: Payer: Self-pay | Admitting: Nurse Practitioner

## 2024-08-14 DIAGNOSIS — E039 Hypothyroidism, unspecified: Secondary | ICD-10-CM

## 2024-08-18 ENCOUNTER — Other Ambulatory Visit: Payer: Self-pay | Admitting: Nurse Practitioner

## 2024-08-18 DIAGNOSIS — E039 Hypothyroidism, unspecified: Secondary | ICD-10-CM

## 2024-08-18 NOTE — Telephone Encounter (Signed)
 Copied from CRM [id number]. Topic: Clinical - Medication Refill >> Aug 18, 2024  9:52 AM Ashley R wrote: Medication:  levothyroxine  (SYNTHROID ) 50 MCG tablet   Has the patient contacted their pharmacy? Yes  This is the patient's preferred pharmacy:  [phone] [facility], [place] - [address] [place] [address] Phone: [phone] Fax: [phone]   Is this the correct pharmacy for this prescription? Yes If no, delete pharmacy and type the correct one.   Has the prescription been filled recently? Yes  Is the patient out of the medication? Yes  Has the patient been seen for an appointment in the last year OR does the patient have an upcoming appointment? Yes  Can we respond through MyChart? No  Agent: Please be advised that Rx refills may take up to 3 business days. We ask that you follow-up with your pharmacy.

## 2024-09-10 ENCOUNTER — Ambulatory Visit (INDEPENDENT_AMBULATORY_CARE_PROVIDER_SITE_OTHER): Admitting: Neurology

## 2024-09-10 ENCOUNTER — Other Ambulatory Visit: Payer: Self-pay | Admitting: Nurse Practitioner

## 2024-09-10 ENCOUNTER — Encounter: Payer: Self-pay | Admitting: Neurology

## 2024-09-10 VITALS — BP 100/72 | HR 83

## 2024-09-10 DIAGNOSIS — R413 Other amnesia: Secondary | ICD-10-CM | POA: Diagnosis not present

## 2024-09-10 DIAGNOSIS — F01A0 Vascular dementia, mild, without behavioral disturbance, psychotic disturbance, mood disturbance, and anxiety: Secondary | ICD-10-CM | POA: Diagnosis not present

## 2024-09-10 DIAGNOSIS — Z8673 Personal history of transient ischemic attack (TIA), and cerebral infarction without residual deficits: Secondary | ICD-10-CM

## 2024-09-10 DIAGNOSIS — G309 Alzheimer's disease, unspecified: Secondary | ICD-10-CM | POA: Diagnosis not present

## 2024-09-10 DIAGNOSIS — I69354 Hemiplegia and hemiparesis following cerebral infarction affecting left non-dominant side: Secondary | ICD-10-CM

## 2024-09-10 DIAGNOSIS — Z820 Family history of epilepsy and other diseases of the nervous system: Secondary | ICD-10-CM | POA: Diagnosis not present

## 2024-09-10 MED ORDER — MEMANTINE HCL 10 MG PO TABS: 10.0000 mg | ORAL_TABLET | Freq: Two times a day (BID) | ORAL | 3 refills | Status: DC

## 2024-09-10 NOTE — Patient Instructions (Signed)
 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  Given strong family collagenosis recommend further evaluation by checking apoprotein E, ATN profile, PET amyloid, MRI scan of the brain, EEG and dementia panel labs.  Trial of Namenda starting 5 mg at night for 2 weeks and then twice daily for 1 month and then 10 mg twice daily as tolerated.  Advised her to increase participation in cognitively challenging activities like probably closer puzzles, playing bridge and sudoku.  We also discussed memory compensation strategies..  Continue aspirin  daily for stroke prevention and maintain aggressive risk factor modification.  Return for follow-up in the future in 6 months or call earlier if necessary.  Memory Compensation Strategies  Use WARM strategy.  W= write it down  A= associate it  R= repeat it  M= make a mental note  2.   You can keep a Glass Blower/designer.  Use a 3-ring notebook with sections for the following: calendar, important names and phone numbers,  medications, doctors' names/phone numbers, lists/reminders, and a section to journal what you did  each day.   3.    Use a calendar to write appointments down.  4.    Write yourself a schedule for the day.  This can be placed on the calendar or in a separate section of the Memory Notebook.  Keeping a  regular schedule can help memory.  5.    Use medication organizer with sections for each day or morning/evening pills.  You may need help loading it  6.    Keep a basket, or pegboard by the door.  Place items that you need to take out with you in the basket or on the pegboard.  You may also want to  include a message board for reminders.  7.    Use sticky notes.  Place sticky notes with reminders in a place where the task is performed.  For example:  turn off the  stove placed by the stove, lock the door placed on the door at eye  level,  take your medications on  the bathroom mirror or by the place where you normally take your medications.  8.    Use alarms/timers.  Use while cooking to remind yourself to check on food or as a reminder to take your medicine, or as a  reminder to make a call, or as a reminder to perform another task, etc.

## 2024-09-10 NOTE — Progress Notes (Addendum)
 " Natalie [name] [address] [place]. [place] [address] [phone]       OFFICE FOLLOW UP NOTE  [name] Date of Birth:  [birth year] Medical Record Number:  [id number]    Primary neurologist: Dr. Rosemarie Reason for visit: stroke and seizure follow up    SUBJECTIVE:   CHIEF COMPLAINT:  Chief Complaint  Patient presents with   Follow-up    Pt in room 20. Husband in room. Here for stroke follow up. MMSE:19      HPI:  Update 09/10/2024 ; she returns for follow-up after last visit 7 months ago.  She is accompanied by her husband.  She is doing well.  She has had no recurrent TIA or stroke symptoms..  She and her husband are concerned about her declining memory and cognitive issues over the last 6 months particularly over the last 3 months.  She has trouble remembering names as well as trouble finishing sentences forgets words.  She is also noticeable decline in difficulty in getting her.  She forgets recent..  She is not driving or cooking.  Does not leave her alone for long period of time..  The patient does have a family history of Alzheimer's in her mother.  She denies any delusions, hallucinations, unsafe behavior, agitation or violent behaviors.  She denies any headache, falls, seizures, new stroke or TIA symptoms.  On cognitive testing today she scored 19/30 on Mini-Mental which suggests mild dementia Update 02/18/2024 JM: Patient returns for follow-up visit accompanied by her husband.  Overall stable without new stroke/TIA symptoms.  Reports continued left-sided dysesthesias. Self d/c'd pregablin due to lack of benefit, never called requesting dosage adjustment. Continues to have left sided pain and bilateral foot pain.  Recently seen by [facility] who noted severe osteopenia bilaterally, was started on meloxicam but denies any great benefit.  Limited ambulation due to pain.  Husband also notes more difficulties with focusing and attention, she will easily  change topics during conversation.  She does have difficulty sleeping at night and takes Ambien  most nights.  No specific memory concerns.  Reports compliance on aspirin  and atorvastatin .  Routinely follows with PCP for stroke risk factor management.  No seizure activity, remains on phenobarbital  without side effects, requesting a refill today.      History provided for reference purposes only Update 08/12/2023 JM: Patient returns for 6-month follow-up accompanied by her daughter.  At prior visit with Duwaine, NP, complained of worsening left-sided dysesthesias, reported no benefit on gabapentin  previously, discussed initiating pregabalin  but patient wished to further consider.  Today, she reports gradual worsening of dysesthesias. Reports previously trialing pregabalin  for prior foot injury but not for post stroke symptoms.  Previously tried duloxetine for depression but caused insomnia. Use of RW for short distance, w/c for long distance.  Denies new stroke/TIA symptoms. Denies any seizure activity, remains on phenobarbital  without side effects.  Prior level satisfactory.  Denies any visual changes, no recent diplopia. Had repeat cataract surgery 6-8 months ago with improvement of vision.  Compliant on aspirin  and atorvastatin .  Routinely follows with PCP for stroke risk factor management.  Update 02/05/2023 MM: Natalie [name] is a 73 y.o. female who has been followed in this office for stroke, seizures. Returns today for follow-up.  She is here today with her daughter.  She reports No seizures.  Reports increase numbness and tingling in the LUE and LLE. Tried Gabapentin  in the past. Reports that it never worked.  She states that the  left side of body also itches she has tried over-the-counter hydrocortisone cream with no benefit.  She has never tried Lyrica .  Update 07/19/2022 Dr. Rosemarie: Patient returns for follow-up after last visit with [facility] nurse practitioner 5 months ago.  He was asked to see me as  he was seen in the ER on 06/07/2022 for sudden onset of diplopia.  He states that the double vision was binocular and more when she was looking to the left.  Was seen in the ER and was found to have mild eye pain as well.  CT scan of the head was obtained on 06/07/2022 which was negative for acute abnormality.  Patient was subsequently seen on ophthalmologist seems to be helping.  Denies any headaches, slurred speech, loss of vision no focal weakness or gait or balance problems.  He has had no other stroke or TIA symptoms. Phenobarbital  for seizures but has not had seizures for more than 23 years.  Willing to consider reducing the dose of her phenobarbital    .for her migraines in the past she has used stadol  nasal spray as needed but she nhas ran out of the prescription and her PCP is no longer willing to prescribe it..  Migraine headaches however appear quite infrequent not more than 2 times a month and over-the-counter medications like Tylenol  also seem to work.  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  She is able to ambulate with a walker but states about 4.  She said no falls or injuries.  Also has history of migraines  Update 01/29/2022 JM: Patient returns for 39-month stroke follow-up accompanied by her daughter.  Overall stable without new stroke/TIA symptoms.  Reports continued left-sided dysesthesias currently on gabapentin  300 mg 3 times daily. Does have some days where pain is worse. Unable to say if any improvement since starting gabapentin  after prior visit but she did unfortunately suffer a fall approx 2 mo after resulting in left trimalleolar ankle fracture and femoral condyle fracture requiring open treatment for trimalleolar fracture on 12/29. She is working with [facility] ortho currently doing PT. Continues to use RW, no additional falls.  Compliant on aspirin  and atorvastatin , denies side effects.  Blood pressure today 146/74.  Routinely  monitors at home, typically 130s.  She has since establish care with new PCP with completion of lab work today.  No new concerns at this time.  Update 07/27/2021 JM: returns for 6 month stroke follow up accompanied by her daughter.  Overall stable without new stroke/TIA symptoms.  Reports continued left sided numbness/tingling. Left sided pain has been interfering with sleep, walking and any type of activity. At prior visit, declined interest in any medication management.  Also right foot numbness which she believes is from prior foot surgery.  She is currently using rolling walker due to worsening right foot pain.  She has had 3 falls since prior visit but thankfully without injury.  Compliant on aspirin  and atorvastatin  without side effects.  Blood pressure today 156/82.  Routinely monitors at home and typically stable.  Glucose levels stable per report.  Plans on obtaining lab work with PCP in the next week.  No further concerns at this time.   Update 01/25/2021 JM: Ms. Culley returns for 66-month stroke follow-up accompanied by her daughter and infant great granddaughter  Stable since prior visit without new stroke/TIA symptoms.  Residual left sided numbness/tingling (thigh to foot and hand). Possibly improving but denies worsening.  Was seen by Dr.  Kirsteins 2/15 and placed on low-dose gabapentin  but she self discontinued as she did not feel as though this was beneficial.  She declines interest in any further medication management She also continues to have gait instability but also recently underwent right foot hardware removal on 12/27/2020 without complication -she has been slowly recovering from this.  Reports compliance on aspirin  81 mg daily and atorvastatin  without associated side effects Blood pressure today 151/91 - monitors at home and typically lower Glucose levels stable at home per patient report She has not had any recent lab  No new concerns at this time  Initial visit 10/26/2020 JM:  Ms. Esty is being seen for hospital follow-up unaccompanied. She was discharged home from CIR on 09/15/2020 with recommendation of HH PT. She reports residual left foot numbness and gait impairment. She also reports right foot fracture s/p surgical procedure 08/2020 with planned hardware removal tomorrow but due to recent stroke, procedure delayed.  She believes this is also be contributing to her gait impairment.  Denies new or worsening stroke/TIA symptoms. She remains on atorvastatin  20 mg daily without myalgias. Blood pressure today 158/78 - monitors at home which typically stable . Glucose levels stable at home. Reports repeat A1c with PCP which was at 7.9.  No concerns at this time.  Stroke admission 09/02/2020 Ms. Darina B Wonder is a 73 y.o. female with history of chronic headaches chronic migraine, chronic numbness BLE, chronic pain syndrome, CKD, hypertension, hyperlipidemia, type 2 diabetes, and seizures since the age of 76, who presented to [facility] ED on 09/02/2020 with left sided weakness and mild headache. Personally reviewed hospitalization pertinent progress notes, lab work and imaging with summary provided. Evaluated by Dr. Rosemarie with stroke work-up revealing acute right corona radiata IPH with mild surrounding edema and trace IVH, etiology likely hypertensive. History of HTN with long-term BP goal normotensive range. History of HLD on atorvastatin  40 mg daily with LDL 69 therefore decrease dosage to 20 mg daily in setting of recent IPH. History of DM with A1c 8.1 on insulin  and glipizide . Other stroke risk factors include advanced age, obesity, migraines and prior strokes by imaging. Other active problems include CKD and seizure disorder on phenobarbital . Evaluated by therapies and discharged to CIR on 09/06/2020 for ongoing therapy needs and residual left-sided weakness and balance impairment.   Stroke: acute R corona radiata IPH with mild surrounding edema and trace IVH-etiology likely  hypertensive CT head -  2 cc acute hematoma in the right corona radiata. There is early extension into the right lateral ventricle. Background chronic small vessel disease.  CT head repeat - Unchanged appearance of right basal ganglia intraparenchymal hemorrhage.  MRI head - Acute R corona radiata IPH with mild surrounding edema and trace IVH. No evidence of underlying lesion. Few chronic microhemorrhages. Mild to moderate chronic microvascular ischemic changes. Chronic small vessel infarcts CTA H&N -no large vessel stenosis or occlusion no aneurysm or AVM. 2D Echo - EF 60-65%. No source of embolus   [employer] Virus 2 - negative LDL - 69 HgbA1c - 8.1 UDS - (not collected)  VTE prophylaxis - SCDs No antithrombotic prior to admission, now on No antithrombotic ICH Therapy recommendations:  CIR  Disposition:  CIR      ROS:   14 system review of systems performed and negative with exception of those listed in HPI  PMH:  Past Medical History:  Diagnosis Date   Arthritis    Atrophy of left kidney    Cervicalgia  Chronic kidney disease, stage 3 (HCC)    acute aki 03-14-2018 in setting pyelonehritis-- resolved   Chronic migraine    Chronic pain syndrome    History of kidney stones    History of pyelonephritis    hx recurrent   History of recurrent UTIs    History of sepsis    07/ 2017 secondary to pyelonephritis   Hypertension    Hypothyroidism    Iron deficiency anemia    Left ureteral stone    Mixed hyperlipidemia    Nephrolithiasis    right side nonobstructive per CT 03-14-2018   Right shoulder pain    post sx 02-18-2018   Seizure disorder (HCC) followed by pcp   per pt dx age 85--  no seizure since 1980's , controlled w/ phenobarbitol   Stroke (HCC) 08/2020   no deficits   Type 2 diabetes mellitus (HCC)    followed by pcp   Wears glasses     PSH:  Past Surgical History:  Procedure Laterality Date   CESAREAN SECTION  1974   CHOLECYSTECTOMY OPEN  2002    CYSTO/  LEFT RETROGRADE PYELOGRAM  06-11-2006   dr watt  [facility]   CYSTOSCOPY WITH RETROGRADE PYELOGRAM, URETEROSCOPY AND STENT PLACEMENT Left 04/10/2018   Procedure: CYSTOSCOPY WITH RETROGRADE PYELOGRAM, URETEROSCOPY AND STENT PLACEMENT;  Surgeon: Matilda Senior, MD;  Location: [facility];  Service: Urology;  Laterality: Left;   EYE SURGERY Bilateral    cataract removal   FOOT ARTHRODESIS Right 09/10/2019   Procedure: ARTHRODESIS FOOT;  Surgeon: Elsa Lonni SAUNDERS, MD;  Location: [facility];  Service: Orthopedics;  Laterality: Right;   HARDWARE REMOVAL Right 12/27/2020   Procedure: DEEP ORTHOPEDIC HARDWARE REMOVAL, RIGHT FOOT;  Surgeon: Elsa Lonni SAUNDERS, MD;  Location: [facility];  Service: Orthopedics;  Laterality: Right;   IR URETERAL STENT PLACEMENT EXISTING ACCESS RIGHT  08-21-2007    dr ceil  [facility]   LEFT RETROGRADE PYELOGRAM/ LEFT URETERAL STENT PLACEMENT  02-25-2001   dr watt [facility]   NEPHROLITHOTOMY Right 05/14/2016   Procedure: NEPHROLITHOTOMY PERCUTANEOUS;  Surgeon: Senior Matilda, MD;  Location: [facility];  Service: Urology;  Laterality: Right;   OPEN REDUCTION INTERNAL FIXATION (ORIF) FOOT LISFRANC FRACTURE Right 09/10/2019   Procedure: OPEN REDUCTION INTERNAL FIXATION (ORIF) FOOT LISFRANC FRACTURE;  Surgeon: Elsa Lonni SAUNDERS, MD;  Location: [facility];  Service: Orthopedics;  Laterality: Right;   ORIF ANKLE FRACTURE Left 09/28/2021   Procedure: OPEN TREATMENT OF LEFT TRIMALLEOLAR FRACTURE WITHOUT POSTERIOR FIXATION, OPEN TREATMENT OF LEFT SYNDESMOSIS DISRUPTION;  Surgeon: Elsa Lonni SAUNDERS, MD;  Location: [facility];  Service: Orthopedics;  Laterality: Left;   REPAIR EXTENSOR TENDON WITH METATARSAL OSTEOTOMY AND OPEN REDUCTION IN Right 09/10/2019   Procedure: OPEN TREATMENT RIGHT FIRST, SECOND, THIRD TARSOMETATARSAL LISFRANC, MIDFOOT FUSION;  Surgeon: Elsa Lonni SAUNDERS, MD;  Location: [facility];   Service: Orthopedics;  Laterality: Right;   SHOULDER ARTHROSCOPY Bilateral left 11-13-2000;  right 02-18-2018   TONSILLECTOMY      Social History:  Social History   Socioeconomic History   Marital status: Married    Spouse name: david   Number of children: 2   Years of education: 2 years of college   Highest education level: Not on file  Occupational History   Not on file  Tobacco Use   Smoking status: Never   Smokeless tobacco: Never  Vaping Use   Vaping status: Never Used  Substance and Sexual Activity  Alcohol use: No    Alcohol/week: 0.0 standard drinks of alcohol   Drug use: No   Sexual activity: Not Currently    Birth control/protection: Post-menopausal  Other Topics Concern   Not on file  Social History Narrative   Was a home maker       Leonor Motto    Social Drivers of Health   Tobacco Use: Low Risk (09/10/2024)   Patient History    Smoking Tobacco Use: Never    Smokeless Tobacco Use: Never    Passive Exposure: Not on file  Financial Resource Strain: Low Risk (04/03/2023)   Overall Financial Resource Strain (CARDIA)    Difficulty of Paying Living Expenses: Not hard at all  Food Insecurity: No Food Insecurity (08/06/2024)   Epic    Worried About Programme Researcher, Broadcasting/film/video in the Last Year: Never true    Ran Out of Food in the Last Year: Never true  Transportation Needs: No Transportation Needs (08/06/2024)   Epic    Lack of Transportation (Medical): No    Lack of Transportation (Non-Medical): No  Physical Activity: Inactive (08/06/2024)   Exercise Vital Sign    Days of Exercise per Week: 0 days    Minutes of Exercise per Session: 0 min  Stress: No Stress Concern Present (08/06/2024)   [employer] of Occupational Health - Occupational Stress Questionnaire    Feeling of Stress: Not at all  Social Connections: Moderately Integrated (08/06/2024)   Social Connection and Isolation Panel    Frequency of Communication with Friends and Family: More than three  times a week    Frequency of Social Gatherings with Friends and Family: Once a week    Attends Religious Services: 1 to 4 times per year    Active Member of [employer] or Organizations: No    Attends Banker Meetings: Never    Marital Status: Married  Catering Manager Violence: Not At Risk (08/06/2024)   Epic    Fear of Current or Ex-Partner: No    Emotionally Abused: No    Physically Abused: No    Sexually Abused: No  Depression (PHQ2-9): Low Risk (08/06/2024)   Depression (PHQ2-9)    PHQ-2 Score: 0  Alcohol Screen: Low Risk (04/03/2023)   Alcohol Screen    Last Alcohol Screening Score (AUDIT): 0  Housing: Unknown (08/06/2024)   Epic    Unable to Pay for Housing in the Last Year: No    Number of Times Moved in the Last Year: Not on file    Homeless in the Last Year: No  Utilities: Not At Risk (08/06/2024)   Epic    Threatened with loss of utilities: No  Health Literacy: Adequate Health Literacy (08/06/2024)   B1300 Health Literacy    Frequency of need for help with medical instructions: Never    Family History:  Family History  Problem Relation Age of Onset   Heart disease Father    Diabetes Father    Migraines Maternal Grandmother    Seizures Maternal Grandmother     Medications:   Current Outpatient Medications on File Prior to Visit  Medication Sig Dispense Refill   amitriptyline  (ELAVIL ) 25 MG tablet TAKE 1 TABLET BY MOUTH EVERYDAY AT BEDTIME 30 tablet 5   amLODipine  (NORVASC ) 5 MG tablet TAKE 1 TABLET (5 MG TOTAL) BY MOUTH DAILY. 90 tablet 1   aspirin  EC 81 MG tablet Take 4 tablets (325 mg total) by mouth daily. Swallow whole. 30 tablet 0  atorvastatin  (LIPITOR) 80 MG tablet TAKE 1 TABLET BY MOUTH EVERY DAY 90 tablet 2   BD INSULIN  SYRINGE U/F 31G X 5/16 0.3 ML MISC Use 1 syringe as directed twice a day     cetirizine  (ZYRTEC ) 10 MG tablet Take 1 tablet (10 mg total) by mouth daily. 30 tablet 11   glipiZIDE  (GLUCOTROL ) 10 MG tablet Take 1 tablet (10 mg total)  by mouth 2 (two) times daily before a meal. 180 tablet 1   levothyroxine  (SYNTHROID ) 50 MCG tablet TAKE 1 TABLET BY MOUTH DAILY BEFORE BREAKFAST 90 tablet 1   losartan  (COZAAR ) 25 MG tablet TAKE 1 TABLET (25 MG TOTAL) BY MOUTH DAILY. 90 tablet 2   meloxicam (MOBIC) 7.5 MG tablet Take 7.5 mg by mouth daily.     NOVOLIN N 100 UNIT/ML injection INJECT 0.2 MLS (20 UNITS TOTAL) INTO THE SKIN IN THE MORNING AND AT BEDTIME. 10 mL 2   PHENobarbital  (LUMINAL) 97.2 MG tablet Take 1 tablet (97.2 mg total) by mouth at bedtime. 90 tablet 0   Semaglutide , 1 MG/DOSE, (OZEMPIC , 1 MG/DOSE,) 4 MG/3ML SOPN INJECT 1 MG ONCE A WEEK AS DIRECTED 3 mL 2   tiZANidine  (ZANAFLEX ) 4 MG tablet TAKE 1 TABLET BY MOUTH EVERY 4-6 HOURS AS NEEDED FOR MUSCLE SPASM (Patient taking differently: Take 4 mg by mouth as needed for muscle spasms.) 180 tablet 1   zolpidem  (AMBIEN ) 5 MG tablet take 1 tablet by mouth at bedtime for sleep if needed. 30 tablet 2   Continuous Glucose Receiver (FREESTYLE LIBRE 3 READER) DEVI 1 each by Does not apply route as directed. (Patient not taking: Reported on 09/10/2024) 1 each 0   Continuous Glucose Sensor (FREESTYLE LIBRE 3 SENSOR) MISC 1 each by Does not apply route as directed. (Patient not taking: Reported on 09/10/2024) 6 each 2   No current facility-administered medications on file prior to visit.    Allergies:   Allergies  Allergen Reactions   Compazine Other (See Comments)    Bad headache, vomiting; IV    Cymbalta [Duloxetine Hcl] Other (See Comments)    Kept me awake   Ferrous Sulfate     Methadone Hives   Escitalopram Anxiety   Macrobid  [Nitrofurantoin  Monohyd Macro] Rash   Nitrofuran Derivatives Rash      OBJECTIVE:  Physical Exam  Vitals:   09/10/24 1335  BP: 100/72  Pulse: 83  SpO2: 98%   There is no height or weight on file to calculate BMI. No results found.  General: well developed, well nourished, very pleasant elderly Caucasian female, seated, in no evident  distress  Neurologic Exam Mental Status: Awake and fully alert. Fluent speech and language. Oriented to place and time. Recent and remote memory intact. Attention span and concentration subjectively impaired and fund of knowledge appropriate. Mood and affect flat, depressed looking.  MMSE scored 19/30.  Clock drawing 3/4.  Unable to copy intersecting pentagons..Able to name only 2 animals. Cranial Nerves: Pupils equal, briskly reactive to light. Extraocular movements full without nystagmus. Visual fields full to confrontation. Hearing intact. Facial sensation intact. Face, tongue, palate moves normally and symmetrically.  Motor: Normal bulk and tone. Normal strength in all tested extremity muscles except difficulty fully testing LLE d/t pain Sensory.: reports altered light touch sensation LLE compared to RLE, intact BUE Coordination: Rapid alternating movements normal in all extremities. Finger-to-nose and heel-to-shin performed accurately bilaterally. Gait and Station: Deferred as RW not present Reflexes: 1+ and symmetric. Toes downgoing.  09/10/2024    1:38 PM  MMSE - Mini Mental State Exam  Orientation to time 3  Orientation to Place 4  Registration 3  Attention/ Calculation 0  Recall 2  Language- name 2 objects 2  Language- repeat 1  Language- follow 3 step command 3  Language- read & follow direction 1  Write a sentence 0  Copy design 0  Total score 19      ASSESSMENT: SHANIQUE ASLINGER is a 73 y.o. year old female with hypertensive right CR IPH in 08/2020. Vascular risk factors include HTN, HLD, DM, obesity, migraines, seizure disorder and prior strokes on imaging.  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  Given  strong family collagenosis recommend further evaluation by checking apoprotein E, ATN profile, PET amyloid finding of significant, likely indicate strong possibility of Alzheimer's and this will impact decision for treatment with antiamyloid therapy.also check MRI scan of the brain, EEG and dementia panel labs.  Trial of Namenda  starting 5 mg at night for 2 weeks and then twice daily for 1 month and then 10 mg twice daily as tolerated.  Advised her to increase participation in cognitively challenging activities like probably closer puzzles, playing bridge and sudoku.  We also discussed memory compensation strategies..  Continue aspirin  daily for stroke prevention and maintain aggressive risk factor modification.  Return for follow-up in the future in 6 months or call earlier if necessary.   I personally spent a total of 50 minutes in the care of the patient today including getting/reviewing separately obtained history, performing a medically appropriate exam/evaluation, counseling and educating, placing orders, referring and communicating with other health care professionals, documenting clinical information in the EHR, independently interpreting results, and coordinating care.    Eather Popp, MD [name] [address] [place], [place] [address]  Phone [phone] Fax [phone] Note: This document was prepared with digital dictation and possible smart phrase technology. Any transcriptional errors that result from this process are unintentional.      "

## 2024-09-13 ENCOUNTER — Other Ambulatory Visit: Payer: Self-pay | Admitting: Neurology

## 2024-09-13 ENCOUNTER — Ambulatory Visit: Payer: Self-pay | Admitting: Neurology

## 2024-09-13 MED ORDER — FOLIC ACID 1 MG PO TABS: 1.0000 mg | ORAL_TABLET | Freq: Every day | ORAL | 3 refills | Status: DC

## 2024-09-14 ENCOUNTER — Other Ambulatory Visit: Payer: Self-pay | Admitting: Neurology

## 2024-09-14 MED ORDER — FOLIC ACID 1 MG PO TABS: 1.0000 mg | ORAL_TABLET | Freq: Every day | ORAL | 3 refills | Status: DC

## 2024-09-14 NOTE — Progress Notes (Signed)
 Kindly inform the patient that lab work for reversible causes of memory loss suggest that vitamin B12 level is low normal.  Homocystine which is a type of protein in the blood is quite high and I recommend taking folic acid  1 mg daily to bring this down.  In case Natalie [name] smokes Natalie [name] needs to quit smoking.  I will send a prescription to her listed pharmacy.  Rest of the lab work for Alzheimer's risk is not back yet

## 2024-09-22 ENCOUNTER — Ambulatory Visit (INDEPENDENT_AMBULATORY_CARE_PROVIDER_SITE_OTHER): Admitting: Neurology

## 2024-09-22 DIAGNOSIS — R413 Other amnesia: Secondary | ICD-10-CM

## 2024-09-22 DIAGNOSIS — R4182 Altered mental status, unspecified: Secondary | ICD-10-CM

## 2024-09-28 ENCOUNTER — Telehealth: Payer: Self-pay

## 2024-09-28 DIAGNOSIS — E039 Hypothyroidism, unspecified: Secondary | ICD-10-CM

## 2024-09-28 DIAGNOSIS — E1122 Type 2 diabetes mellitus with diabetic chronic kidney disease: Secondary | ICD-10-CM

## 2024-09-28 DIAGNOSIS — G4700 Insomnia, unspecified: Secondary | ICD-10-CM

## 2024-09-28 DIAGNOSIS — E1142 Type 2 diabetes mellitus with diabetic polyneuropathy: Secondary | ICD-10-CM

## 2024-09-28 DIAGNOSIS — I10 Essential (primary) hypertension: Secondary | ICD-10-CM

## 2024-09-28 NOTE — Telephone Encounter (Signed)
 LAST APPOINTMENT DATE: 05/04/2024   NEXT APPOINTMENT DATE: 11/04/2024  Pt requests all routine/maintenance prescriptions go to [facility] delivery pharmacy

## 2024-09-29 LAB — APOE ALZHEIMER'S DISEASE RISK

## 2024-09-29 LAB — ATN PROFILE
A -- Beta-amyloid 42/40 Ratio: 0.124
Beta-amyloid 40: 331.65 pg/mL
Beta-amyloid 42: 40.99 pg/mL
N -- NfL, Plasma: 9.69 pg/mL — ABNORMAL HIGH (ref 0.00–6.04)
T -- p-tau181: 1.96 pg/mL — ABNORMAL HIGH (ref 0.00–0.97)

## 2024-09-29 LAB — DEMENTIA PANEL
Homocysteine: 42.4 umol/L — ABNORMAL HIGH (ref 0.0–19.2)
RPR Ser Ql: NONREACTIVE
TSH: 1.77 u[IU]/mL (ref 0.450–4.500)
Vitamin B-12: 262 pg/mL (ref 232–1245)

## 2024-09-29 MED ORDER — AMLODIPINE BESYLATE 5 MG PO TABS: 5.0000 mg | ORAL_TABLET | Freq: Every day | ORAL | 1 refills | Status: AC

## 2024-09-29 MED ORDER — ATORVASTATIN CALCIUM 80 MG PO TABS: 80.0000 mg | ORAL_TABLET | Freq: Every day | ORAL | 2 refills | Status: AC

## 2024-09-29 MED ORDER — INSULIN NPH (HUMAN) (ISOPHANE) 100 UNIT/ML ~~LOC~~ SUSP: SUBCUTANEOUS | 2 refills | Status: AC

## 2024-09-29 MED ORDER — LOSARTAN POTASSIUM 25 MG PO TABS: 25.0000 mg | ORAL_TABLET | Freq: Every day | ORAL | 2 refills | Status: AC

## 2024-09-29 MED ORDER — GLIPIZIDE 10 MG PO TABS: 10.0000 mg | ORAL_TABLET | Freq: Two times a day (BID) | ORAL | 1 refills | Status: AC

## 2024-09-29 MED ORDER — OZEMPIC (1 MG/DOSE) 4 MG/3ML ~~LOC~~ SOPN: 1.0000 mg | PEN_INJECTOR | SUBCUTANEOUS | 2 refills | Status: AC

## 2024-09-29 MED ORDER — ZOLPIDEM TARTRATE 5 MG PO TABS: ORAL_TABLET | ORAL | 2 refills | Status: AC

## 2024-09-29 MED ORDER — LEVOTHYROXINE SODIUM 50 MCG PO TABS: 50.0000 ug | ORAL_TABLET | Freq: Every day | ORAL | 1 refills | Status: AC

## 2024-09-29 NOTE — Addendum Note (Signed)
 Addended by: WENDEE LYNWOOD HERO on: 09/29/2024 11:22 AM   Modules accepted: Orders

## 2024-09-30 ENCOUNTER — Other Ambulatory Visit: Payer: Self-pay | Admitting: *Deleted

## 2024-09-30 MED ORDER — MEMANTINE HCL 10 MG PO TABS: 10.0000 mg | ORAL_TABLET | Freq: Two times a day (BID) | ORAL | 3 refills | Status: AC

## 2024-09-30 MED ORDER — FOLIC ACID 1 MG PO TABS: 1.0000 mg | ORAL_TABLET | Freq: Every day | ORAL | 3 refills | Status: AC

## 2024-10-01 ENCOUNTER — Other Ambulatory Visit: Payer: Self-pay | Admitting: Neurology

## 2024-10-01 MED ORDER — FOLIC ACID 1 MG PO TABS: 1.0000 mg | ORAL_TABLET | Freq: Every day | ORAL | 3 refills | Status: AC

## 2024-10-05 ENCOUNTER — Telehealth: Payer: Self-pay | Admitting: Neurology

## 2024-10-05 NOTE — Telephone Encounter (Signed)
 Updated notes sent to [facility].

## 2024-10-05 NOTE — Telephone Encounter (Signed)
 Per insurance, can you please update her note to show: Documentation that PET Scan result is expected to influence treatment   She is scheduled for tomorrow afternoon currently.

## 2024-10-05 NOTE — Telephone Encounter (Signed)
 She was scheduled for the pet scan on 10/06/24 because she had medicare and [employer] [place] supp-but as of 10/01/24 she has [employer] which requires a PA. She will have to be rescheduled until after they approve it. [employer] Tracking [phone]

## 2024-10-06 ENCOUNTER — Telehealth: Payer: Self-pay | Admitting: Nurse Practitioner

## 2024-10-06 ENCOUNTER — Encounter (HOSPITAL_COMMUNITY): Admission: RE | Admit: 2024-10-06 | Source: Ambulatory Visit

## 2024-10-06 ENCOUNTER — Other Ambulatory Visit: Payer: Self-pay | Admitting: Nurse Practitioner

## 2024-10-06 DIAGNOSIS — M25511 Pain in right shoulder: Secondary | ICD-10-CM

## 2024-10-06 DIAGNOSIS — L299 Pruritus, unspecified: Secondary | ICD-10-CM

## 2024-10-06 DIAGNOSIS — G8918 Other acute postprocedural pain: Secondary | ICD-10-CM

## 2024-10-06 NOTE — Telephone Encounter (Unsigned)
 Copied from CRM [id number]. Topic: Clinical - Medication Refill >> Oct 06, 2024  9:03 AM Thersia C wrote: Medication:  tiZANidine  (ZANAFLEX ) 4 MG tablet  amitriptyline  (ELAVIL ) 25 MG tablet cetirizine  (ZYRTEC ) 10 MG tablet  Has the patient contacted their pharmacy? Yes (Agent: If no, request that the patient contact the pharmacy for the refill. If patient does not wish to contact the pharmacy document the reason why and proceed with request.) (Agent: If yes, when and what did the pharmacy advise?)  This is the patient's preferred pharmacy:  [facility] - [place], [place] - [address] [place] [address] Phone: [phone] Fax: [phone]  Is this the correct pharmacy for this prescription? Yes If no, delete pharmacy and type the correct one.   Has the prescription been filled recently? No  Is the patient out of the medication? Yes  Has the patient been seen for an appointment in the last year OR does the patient have an upcoming appointment? Yes  Can we respond through MyChart? Yes  Agent: Please be advised that Rx refills may take up to 3 business days. We ask that you follow-up with your pharmacy.

## 2024-10-06 NOTE — Telephone Encounter (Signed)
 Patient's husband called back to ask a question about stopping a Alzheimer's  medication. Would like a call back. Dr. Bucky nurse, Briant relayed do not need to stop any medication. Relayed to Mr. Deaton, he verbalized understand.

## 2024-10-06 NOTE — Telephone Encounter (Signed)
 Prescription Request  10/06/2024  LOV: 05/04/2024  What is the name of the medication or equipment?  insulin  NPH Human (NOVOLIN N) 100 UNIT/ML injection  Have you contacted your pharmacy to request a refill? Yes   Which pharmacy would you like this sent to?  [facility] - [place], [place] - [address] [place] [address] Phone: [phone] Fax: [phone]  Patient notified that their request is being sent to the clinical staff for review and that they should receive a response within 2 business days.   Please advise at [facility] [phone]

## 2024-10-06 NOTE — Telephone Encounter (Signed)
 Natalie [name] rescheduled her pet scan to 1/15 since her PA is still pending.

## 2024-10-06 NOTE — Telephone Encounter (Addendum)
 Called pt's husband and let him know the below lab results per Dr. Rosemarie.    ----- Message from Nurse Heather BROCKS, RN sent at 10/05/2024  4:22 PM EST -----  ----- Message ----- From: Rosemarie Eather RAMAN, MD Sent: 10/01/2024   1:12 PM EST To: Lynwood CHRISTELLA Crandall, NP; Gna-Pod 3 Results  Kindly inform the patient that the lab work to look for risk for Alzheimer's is back and she does not appear to be at high risk for Alzheimer's.  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  I am prescribing folic acid  1 mg daily which she needs to take and homocystine levels need to be repeated in 2 to 3 months to see if they come down.   In case she smokes she needs to quit smoking.  I will send a prescription to her pharmacy

## 2024-10-07 ENCOUNTER — Telehealth: Payer: Self-pay | Admitting: Nurse Practitioner

## 2024-10-07 MED ORDER — CETIRIZINE HCL 10 MG PO TABS: 10.0000 mg | ORAL_TABLET | Freq: Every day | ORAL | 11 refills | Status: AC

## 2024-10-07 NOTE — Telephone Encounter (Signed)
 Pt husband came in office and stated that his wife really need the for provider to give him a call concerning the Novolin its holding up all pt meds husband also stated that he would like it to be sent to [facility]

## 2024-10-07 NOTE — Telephone Encounter (Signed)
 Called pt husband on dpr.  Informed him of NOVOLIN confirmed and sent in to [facility] pharmacy. Advised to pts husband that it is being filled today.  Pt will call if there are any concerns or questions.

## 2024-10-07 NOTE — Telephone Encounter (Signed)
 Called and spoke with pharmacy Rep wanted to verify use of either vials or flex pens. Confrimed with pharmacist to dispense flex pens.  Verbalized understanding.

## 2024-10-07 NOTE — Telephone Encounter (Signed)
 Natalie [name]: [id number] exp. 10/05/24-12/04/24

## 2024-10-07 NOTE — Telephone Encounter (Signed)
 This was sent in on 09/29/2024. Can we call and verify the pharmacy received it

## 2024-10-12 ENCOUNTER — Other Ambulatory Visit: Payer: Self-pay

## 2024-10-12 ENCOUNTER — Other Ambulatory Visit: Payer: Self-pay | Admitting: Neurology

## 2024-10-12 MED ORDER — AMITRIPTYLINE HCL 25 MG PO TABS: 25.0000 mg | ORAL_TABLET | Freq: Every day | ORAL | 2 refills | Status: AC

## 2024-10-12 NOTE — Telephone Encounter (Signed)
 Patients husband(on Dpr) came in to the office regarding 2 prescriptions. amitriptyline  (ELAVIL ) 25 MG tablet  PHENobarbital  (LUMINAL) 97.2 MG tablet  to refill ant [facility] and he received a letter requiring additional information, Not states they attempted to reach our office but no response, phone [phone] fax [phone] or electronically eprescribe. If you need to speak to husband call [phone]

## 2024-10-12 NOTE — Addendum Note (Signed)
 Addended by: IZELL HOUSEHOLDER R on: 10/12/2024 04:24 PM   Modules accepted: Orders

## 2024-10-12 NOTE — Telephone Encounter (Signed)
 Will update amitriptyline  order as requested to 100 tablets per fill.  Phenobarbital  unable to be filled as too early, filled for 90 days on 11/3. Please advise pharmacy/patient unable to fill phenobarbital  for 100 days as controlled substance. Max will be 90 day fill once due.

## 2024-10-12 NOTE — Telephone Encounter (Signed)
 Called [facility] and they stated that pt will need 100 day supply of Amitriptyline  25mg  and Phenobarbital  97.2mg .  Sending to NP to fill.

## 2024-10-15 ENCOUNTER — Ambulatory Visit (HOSPITAL_COMMUNITY)
Admission: RE | Admit: 2024-10-15 | Discharge: 2024-10-15 | Disposition: A | Discharge status: Home / Self Care | Source: Ambulatory Visit | Attending: Neurology | Admitting: Neurology

## 2024-10-15 DIAGNOSIS — G309 Alzheimer's disease, unspecified: Secondary | ICD-10-CM | POA: Insufficient documentation

## 2024-10-15 DIAGNOSIS — G319 Degenerative disease of nervous system, unspecified: Secondary | ICD-10-CM | POA: Insufficient documentation

## 2024-10-15 DIAGNOSIS — F0280 Dementia in other diseases classified elsewhere without behavioral disturbance: Secondary | ICD-10-CM | POA: Diagnosis not present

## 2024-10-15 MED ORDER — FLORBETAPIR F 18 500-1900 MBQ/ML IV SOLN
10.0000 | Freq: Once | INTRAVENOUS | Status: AC
  Administered 2024-10-15: 8.633 via INTRAVENOUS

## 2024-10-23 ENCOUNTER — Telehealth: Payer: Self-pay | Admitting: Nurse Practitioner

## 2024-10-23 NOTE — Telephone Encounter (Signed)
 Type of form received: [employer]    Additional comments:   Received by:   Form should be Faxed to: [phone]   Form should be mailed to:     Is patient requesting call for pickup: Y      Form placed:  in Erins box    Attach charge sheet. Y   Individual made aware of 3-5 business day turn around (Y/N)? Y

## 2024-10-23 NOTE — Telephone Encounter (Addendum)
 FMLA for care taker forms received for completion for patient. Patient has been informed that process may take up to 5 business days.  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 2.4.26 Patient is requesting start date of 2.1.26 Patient is requesting end date of 4.26.26 Continuous for 12 weeks per patient  Verified with patient that it is ok to leave Voicemail updates on  Mobile [phone] (mobile)  Patient would like to pick up copy in our office when form is completed.   Fax number form should be sent to is [phone]  Forms placed in providers box for review

## 2024-10-30 ENCOUNTER — Other Ambulatory Visit: Payer: Self-pay

## 2024-10-30 MED ORDER — PHENOBARBITAL 97.2 MG PO TABS: 97.2000 mg | ORAL_TABLET | Freq: Every day | ORAL | 4 refills | Status: AC

## 2024-10-30 NOTE — Progress Notes (Unsigned)
 Pt Last Seen 09/10/24 Upcoming Appointment 08/24/25  Luminal 97.2mg  Last Seen 08/03/24 90 day Supply

## 2024-11-03 ENCOUNTER — Other Ambulatory Visit: Payer: Self-pay | Admitting: Adult Health

## 2024-11-04 ENCOUNTER — Ambulatory Visit: Admitting: Nurse Practitioner

## 2024-11-04 VITALS — BP 112/70 | HR 77 | Temp 98.0°F | Ht <= 58 in | Wt 156.2 lb

## 2024-11-04 DIAGNOSIS — E1122 Type 2 diabetes mellitus with diabetic chronic kidney disease: Secondary | ICD-10-CM

## 2024-11-04 DIAGNOSIS — R600 Localized edema: Secondary | ICD-10-CM

## 2024-11-04 DIAGNOSIS — M79604 Pain in right leg: Secondary | ICD-10-CM

## 2024-11-04 DIAGNOSIS — R202 Paresthesia of skin: Secondary | ICD-10-CM

## 2024-11-04 LAB — COMPREHENSIVE METABOLIC PANEL WITH GFR
ALT: 13 U/L (ref 3–35)
AST: 16 U/L (ref 5–37)
Albumin: 3.8 g/dL (ref 3.5–5.2)
Alkaline Phosphatase: 88 U/L (ref 39–117)
BUN: 9 mg/dL (ref 6–23)
CO2: 26 meq/L (ref 19–32)
Calcium: 9.6 mg/dL (ref 8.4–10.5)
Chloride: 106 meq/L (ref 96–112)
Creatinine, Ser: 0.93 mg/dL (ref 0.40–1.20)
GFR: 59.97 mL/min — ABNORMAL LOW
Glucose, Bld: 149 mg/dL — ABNORMAL HIGH (ref 70–99)
Potassium: 4 meq/L (ref 3.5–5.1)
Sodium: 142 meq/L (ref 135–145)
Total Bilirubin: 0.3 mg/dL (ref 0.2–1.2)
Total Protein: 6.7 g/dL (ref 6.0–8.3)

## 2024-11-04 LAB — CBC
HCT: 37.4 % (ref 36.0–46.0)
Hemoglobin: 12.1 g/dL (ref 12.0–15.0)
MCHC: 32.3 g/dL (ref 30.0–36.0)
MCV: 91.1 fl (ref 78.0–100.0)
Platelets: 264 10*3/uL (ref 150.0–400.0)
RBC: 4.1 Mil/uL (ref 3.87–5.11)
RDW: 14.8 % (ref 11.5–15.5)
WBC: 7.4 10*3/uL (ref 4.0–10.5)

## 2024-11-04 LAB — BRAIN NATRIURETIC PEPTIDE: Pro B Natriuretic peptide (BNP): 23 pg/mL (ref 1.0–100.0)

## 2024-11-04 LAB — IBC + FERRITIN
Ferritin: 6.2 ng/mL — ABNORMAL LOW (ref 10.0–291.0)
Iron: 52 ug/dL (ref 42–145)
Saturation Ratios: 13 % — ABNORMAL LOW (ref 20.0–50.0)
TIBC: 399 ug/dL (ref 250.0–450.0)
Transferrin: 285 mg/dL (ref 212.0–360.0)

## 2024-11-04 LAB — POCT GLYCOSYLATED HEMOGLOBIN (HGB A1C): Hemoglobin A1C: 7.3 % — AB (ref 4.0–5.6)

## 2024-11-04 LAB — TSH: TSH: 2.73 u[IU]/mL (ref 0.35–5.50)

## 2024-11-04 LAB — VITAMIN B12: Vitamin B-12: 176 pg/mL — ABNORMAL LOW (ref 211–911)

## 2024-11-04 NOTE — Patient Instructions (Addendum)
Nice to see you today I want to see you in 3 months, sooner if you need me I will be in touch with the labs once I have them

## 2024-11-04 NOTE — Telephone Encounter (Signed)
 Patient's prescription is at [facility], [facility] stated the mailed the prescription out on 10/31/2024.  I spoke to the patient and let her know that her prescription had been sent out.

## 2024-11-04 NOTE — Progress Notes (Signed)
 "  Established Patient Office Visit  Subjective   Patient ID: Natalie [name], female    DOB: 07/01/1951  Age: 74 y.o. MRN: [id number]  Chief Complaint  Patient presents with   Diabetes   Medication Problem    Memantine . States of itching and leg pain.      Discussed the use of AI scribe software for clinical note transcription with the patient, who gave verbal consent to proceed.  History of Present Illness Natalie [name] is a 74 year old female with diabetes who presents for management of her blood sugar levels and leg pain.  She is experiencing challenges in managing her diabetes due to not having a functional glucose monitor at home because of insurance issues, leading to irregular blood sugar monitoring. Her current medications include glipizide  taken twice daily, NPH insulin  at 20 units twice daily, and Ozempic  1 mg once weekly. Her recent A1c has increased from 6.0% to 7.3%. She faces dietary challenges, noting that her spouse is not the best cook and typically consumes two meals a day without snacking. Her beverage of choice is Coke Zero, and she struggles to drink enough water.  She experiences itching all over her body, which she suspects may be related to her medication, Namenda  (memantine ). She has recently started taking cetirizine  (Zyrtec ) to alleviate the itching.  She is experiencing significant leg pain, which began after an MRI and PET scan on January 15th. The pain is bilateral and constant, affecting her ability to stand and move independently. She also reports new onset numbness and tingling in her legs. Despite these symptoms, she can move her legs but cannot stand on them. Her legs swell frequently, although they are often elevated at home. She denies any falls.  Her appetite has diminished, and she has lost weight. She does not feel hungry and struggles to find foods that appeal to her taste. Despite this, she is able to feed herself and perform basic activities of daily  living with assistance from her caregiver.  She has a history of taking phenobarbital  for seizures since she was [age], but there have been issues with obtaining this medication from the pharmacy.     Review of Systems  Constitutional:  Negative for chills and fever.  Respiratory:  Negative for shortness of breath.   Cardiovascular:  Negative for chest pain.  Gastrointestinal:  Negative for abdominal pain, nausea and vomiting.  Musculoskeletal:  Positive for joint pain. Negative for back pain.  Neurological:  Positive for tingling and weakness.      Objective:     BP 112/70   Pulse 77   Temp 98 F (36.7 C) (Oral)   Ht 4' 9 (1.448 m)   Wt 156 lb 3.2 oz (70.9 kg)   SpO2 98%   BMI 33.80 kg/m  BP Readings from Last 3 Encounters:  11/04/24 112/70  09/10/24 100/72  08/06/24 128/68   Wt Readings from Last 3 Encounters:  11/04/24 156 lb 3.2 oz (70.9 kg)  08/06/24 161 lb (73 kg)  05/04/24 161 lb 12.8 oz (73.4 kg)   SpO2 Readings from Last 3 Encounters:  11/04/24 98%  09/10/24 98%  05/04/24 95%      Physical Exam Vitals and nursing note reviewed.  Constitutional:      Appearance: Normal appearance.     Comments: In a wheel chair  Cardiovascular:     Rate and Rhythm: Normal rate and regular rhythm.     Heart sounds: Normal heart sounds.  Pulmonary:  Effort: Pulmonary effort is normal.     Breath sounds: Normal breath sounds.  Musculoskeletal:     Right lower leg: Edema present.     Left lower leg: Edema present.  Neurological:     Mental Status: She is alert.     Deep Tendon Reflexes:     Reflex Scores:      Patellar reflexes are 2+ on the right side and 2+ on the left side.    Comments: Left lower extremity 4/5 with flexion  Bilateral lower extremity 5/5 extension   Negative straight leg raise   Psychiatric:        Mood and Affect: Affect is flat.      Results for orders placed or performed in visit on 11/04/24  POCT glycosylated hemoglobin  (Hb A1C)  Result Value Ref Range   Hemoglobin A1C 7.3 (A) 4.0 - 5.6 %   HbA1c POC (<> result, manual entry)     HbA1c, POC (prediabetic range)     HbA1c, POC (controlled diabetic range)        The ASCVD Risk score (Arnett DK, et al., 2019) failed to calculate for the following reasons:   Risk score cannot be calculated because patient has a medical history suggesting prior/existing ASCVD   * - Cholesterol units were assumed    Assessment & Plan:   Problem List Items Addressed This Visit       Endocrine   Diabetes mellitus, type II (HCC) - Primary   Relevant Orders   POCT glycosylated hemoglobin (Hb A1C) (Completed)   CBC   Comprehensive metabolic panel with GFR   Vitamin B12   Other Visit Diagnoses       Bilateral lower extremity edema       Relevant Orders   Brain natriuretic peptide     Bilateral leg pain       Relevant Orders   CBC   Comprehensive metabolic panel with GFR   Vitamin B12   IBC + Ferritin   Ambulatory referral to Home Health     Paresthesia       Relevant Orders   CBC   Comprehensive metabolic panel with GFR   Vitamin B12   TSH      Assessment and Plan Assessment & Plan Type 2 diabetes mellitus with diabetic chronic kidney disease A1c increased from 6.0 to 7.3, likely due to dietary changes. Current medications maintained to avoid hypoglycemia. - Continue Glipizide  10 mg twice daily. - Continue Novolin 20 units twice daily. - Continue Ozempic  1 mg weekly. - Ordered blood work to assess liver, kidneys, electrolytes, thyroid , and iron levels.  Progressive lower extremity weakness and pain with functional decline Progressive weakness, pain, numbness, and tingling in legs with functional decline. Possible deconditioning, medication side effects, or disease progression. - Ordered blood work to assess for causes of leg pain and weakness. - Initiated physical therapy to improve strength and mobility. - Arranged home health services for  additional support. - Will follow up with neurologist regarding Namenda  and potential side effects.  General Health Maintenance Discussed hydration and dietary habits. Current intake includes zero sugar Coke and insufficient water. - Encouraged increased water intake to 64 ounces daily. - Discussed potential need to switch diabetes medications if Ozempic  becomes unaffordable.  Return in about 3 months (around 02/01/2025) for DM recheck, weight, leg strength/PT.    Adina Crandall, NP  "

## 2024-11-05 ENCOUNTER — Other Ambulatory Visit: Payer: Self-pay | Admitting: Nurse Practitioner

## 2024-11-05 ENCOUNTER — Ambulatory Visit: Payer: Self-pay | Admitting: Nurse Practitioner

## 2024-11-05 DIAGNOSIS — E611 Iron deficiency: Secondary | ICD-10-CM

## 2024-11-05 DIAGNOSIS — D518 Other vitamin B12 deficiency anemias: Secondary | ICD-10-CM

## 2024-11-05 NOTE — Telephone Encounter (Signed)
 Completed forms received and faxed to [facility] at [phone]  Copy sent to scan  Copy left at front desk for spouse to pick up, left voice message letting him know.

## 2024-11-05 NOTE — Telephone Encounter (Signed)
 Forms completed and placed on Erin Ting's desk

## 2024-11-06 NOTE — Telephone Encounter (Signed)
 Pt husband came by and picked it up.

## 2024-12-03 ENCOUNTER — Other Ambulatory Visit

## 2025-02-01 ENCOUNTER — Ambulatory Visit: Admitting: Nurse Practitioner

## 2025-08-10 ENCOUNTER — Ambulatory Visit

## 2025-08-24 ENCOUNTER — Ambulatory Visit: Admitting: Neurology
